# Patient Record
Sex: Male | Born: 1950
Health system: Southern US, Community
[De-identification: ages and names within clinical notes are randomized; demographics above are authoritative.]

## PROBLEM LIST (undated history)

## (undated) DIAGNOSIS — Q8740 Marfan's syndrome, unspecified: Secondary | ICD-10-CM

## (undated) DIAGNOSIS — H543 Unqualified visual loss, both eyes: Secondary | ICD-10-CM

## (undated) DIAGNOSIS — E785 Hyperlipidemia, unspecified: Secondary | ICD-10-CM

## (undated) DIAGNOSIS — K469 Unspecified abdominal hernia without obstruction or gangrene: Secondary | ICD-10-CM

## (undated) DIAGNOSIS — I10 Essential (primary) hypertension: Secondary | ICD-10-CM

## (undated) DIAGNOSIS — I96 Gangrene, not elsewhere classified: Secondary | ICD-10-CM

## (undated) DIAGNOSIS — T7840XA Allergy, unspecified, initial encounter: Secondary | ICD-10-CM

## (undated) HISTORY — PX: PENILE PROSTHESIS IMPLANT: SHX240

## (undated) HISTORY — PX: INGUINAL HERNIA REPAIR: SHX194

## (undated) HISTORY — DX: Allergy, unspecified, initial encounter: T78.40XA

## (undated) HISTORY — PX: APPENDECTOMY: SHX54

## (undated) HISTORY — DX: Gangrene, not elsewhere classified: I96

## (undated) HISTORY — DX: Hyperlipidemia, unspecified: E78.5

## (undated) HISTORY — DX: Unspecified abdominal hernia without obstruction or gangrene: K46.9

## (undated) HISTORY — DX: Marfan syndrome, unspecified: Q87.40

## (undated) HISTORY — DX: Unqualified visual loss, both eyes: H54.3

## (undated) HISTORY — DX: Essential (primary) hypertension: I10

## (undated) HISTORY — PX: ROTATOR CUFF REPAIR: SHX139

---
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
## 1968-02-17 HISTORY — PX: RETINAL DETACHMENT SURGERY: SHX105

## 1998-03-20 ENCOUNTER — Encounter: Admission: RE | Admit: 1998-03-20 | Discharge: 1998-06-18 | Payer: Self-pay | Admitting: Internal Medicine

## 2000-12-10 ENCOUNTER — Emergency Department (HOSPITAL_COMMUNITY): Admission: EM | Admit: 2000-12-10 | Discharge: 2000-12-10 | Payer: Self-pay | Admitting: Emergency Medicine

## 2001-08-18 ENCOUNTER — Ambulatory Visit (HOSPITAL_COMMUNITY): Admission: RE | Admit: 2001-08-18 | Discharge: 2001-08-18 | Payer: Self-pay | Admitting: Gastroenterology

## 2001-10-04 ENCOUNTER — Encounter: Admission: RE | Admit: 2001-10-04 | Discharge: 2001-10-04 | Payer: Self-pay | Admitting: Urology

## 2001-10-04 ENCOUNTER — Encounter: Payer: Self-pay | Admitting: Urology

## 2001-10-21 ENCOUNTER — Ambulatory Visit (HOSPITAL_BASED_OUTPATIENT_CLINIC_OR_DEPARTMENT_OTHER): Admission: RE | Admit: 2001-10-21 | Discharge: 2001-10-21 | Payer: Self-pay | Admitting: Urology

## 2001-10-21 ENCOUNTER — Encounter: Payer: Self-pay | Admitting: Urology

## 2002-03-01 ENCOUNTER — Ambulatory Visit (HOSPITAL_BASED_OUTPATIENT_CLINIC_OR_DEPARTMENT_OTHER): Admission: RE | Admit: 2002-03-01 | Discharge: 2002-03-01 | Payer: Self-pay | Admitting: Orthopedic Surgery

## 2004-06-26 IMAGING — CR DG CHEST 2V
2 series · 2 of 2 positions shown · non-contrast
Comparison: none

CLINICAL DATA: Erectile dysfunction.  Hypertension.  Preoperative respiratory exam.
 CHEST - 2 VIEW: 
 The heart size and mediastinal contours are normal.  The lungs are clear.  The visualized skeleton is unremarkable.

[view not recorded (1 of 2)]
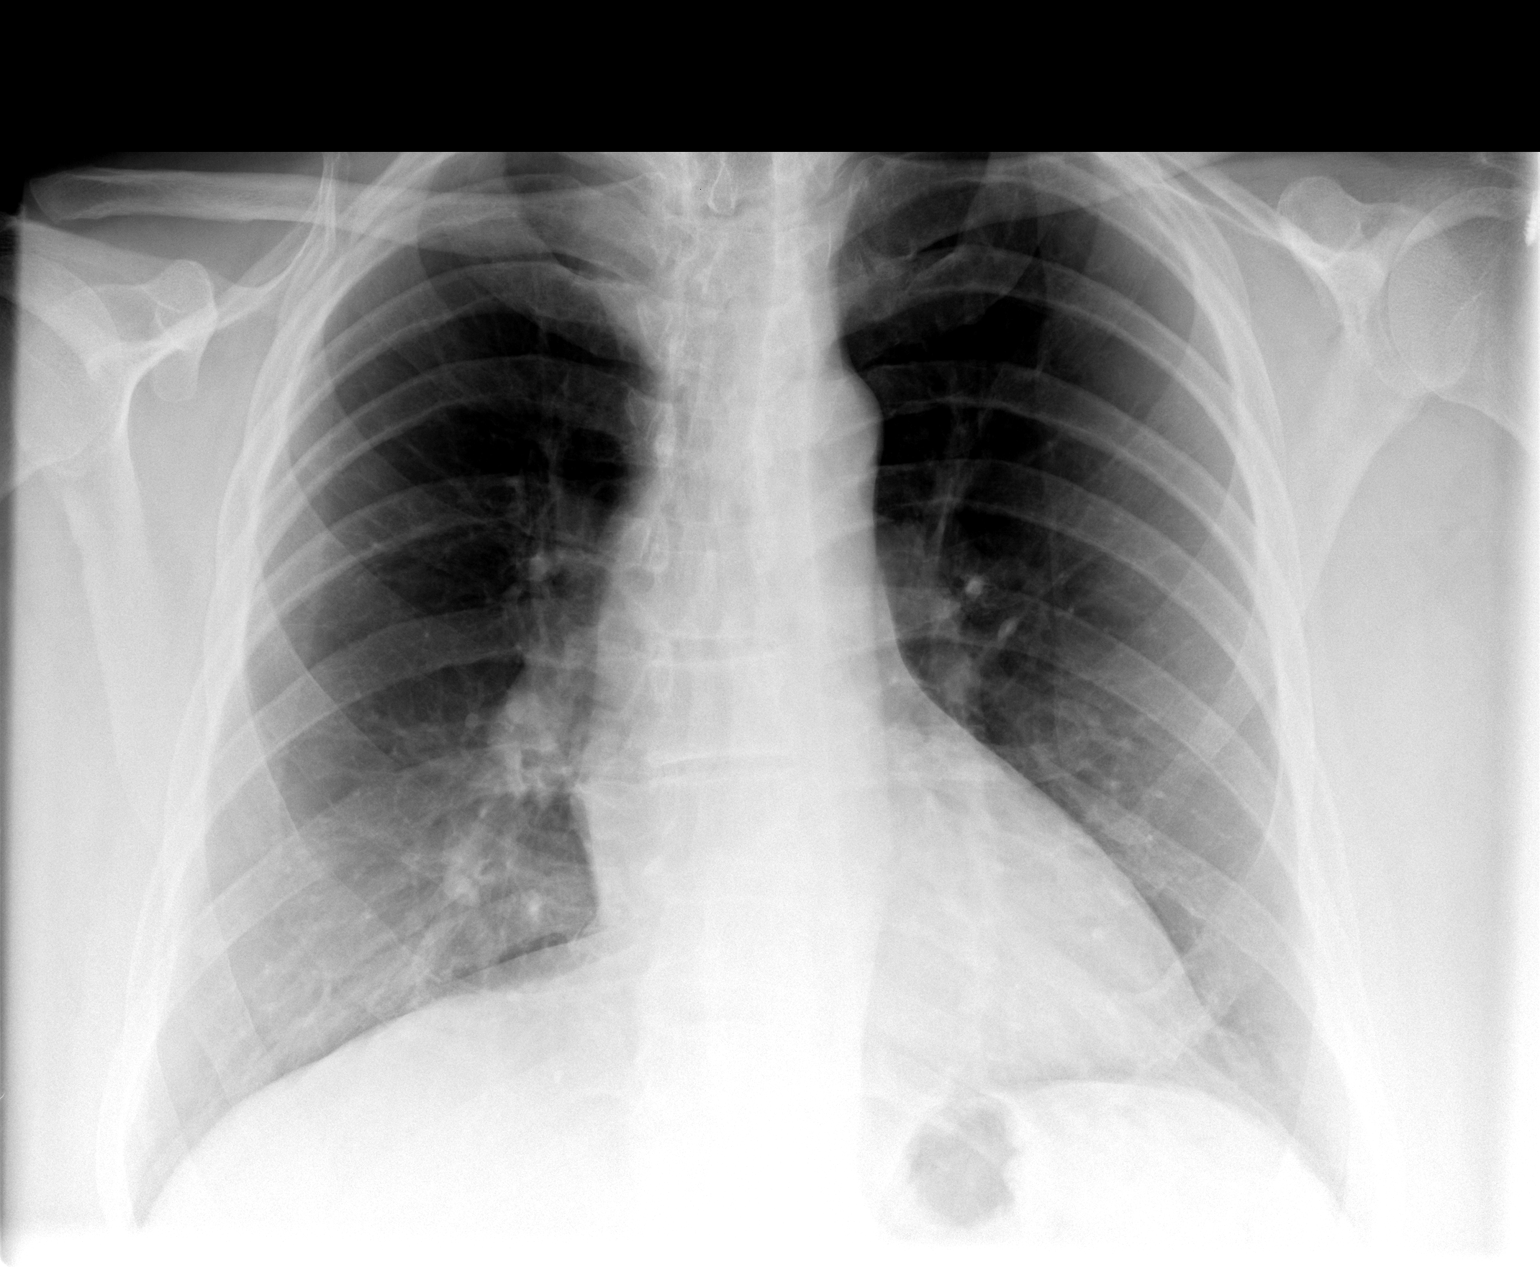

[view not recorded (2 of 2)]
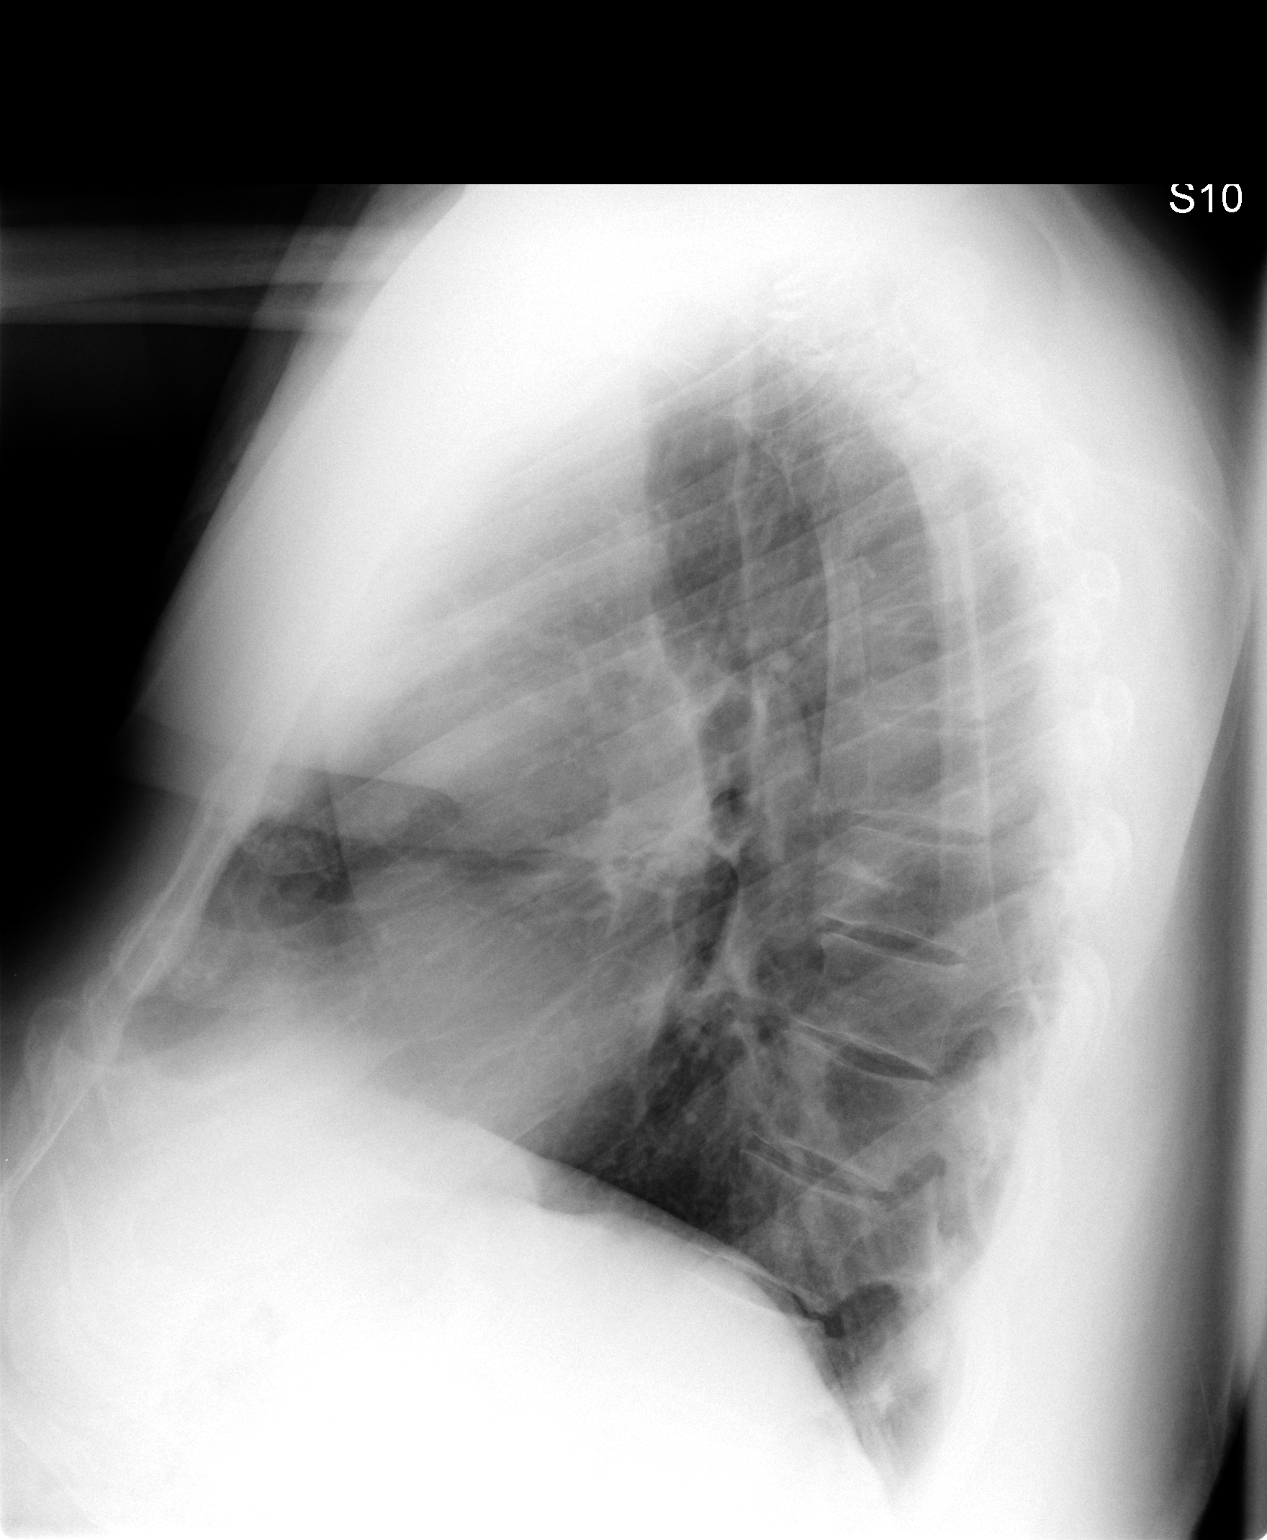

[2 of 2 positions shown; findings below may reference images not displayed]

IMPRESSION: No active disease.

## 2004-06-30 ENCOUNTER — Observation Stay (HOSPITAL_COMMUNITY): Admission: RE | Admit: 2004-06-30 | Discharge: 2004-07-01 | Payer: Self-pay | Admitting: Urology

## 2007-02-17 HISTORY — PX: OTHER SURGICAL HISTORY: SHX169

## 2008-05-21 ENCOUNTER — Ambulatory Visit (HOSPITAL_BASED_OUTPATIENT_CLINIC_OR_DEPARTMENT_OTHER): Admission: RE | Admit: 2008-05-21 | Discharge: 2008-05-21 | Payer: Self-pay | Admitting: Orthopedic Surgery

## 2009-12-10 IMAGING — CR DG CHEST 2V
2 series · 2 of 2 positions shown · non-contrast
Comparison: Chest x-ray of [DATE]

CLINICAL DATA: Preop for surgery to repair left inguinal hernia

CHEST - 2 VIEW

[view not recorded (1 of 2)]
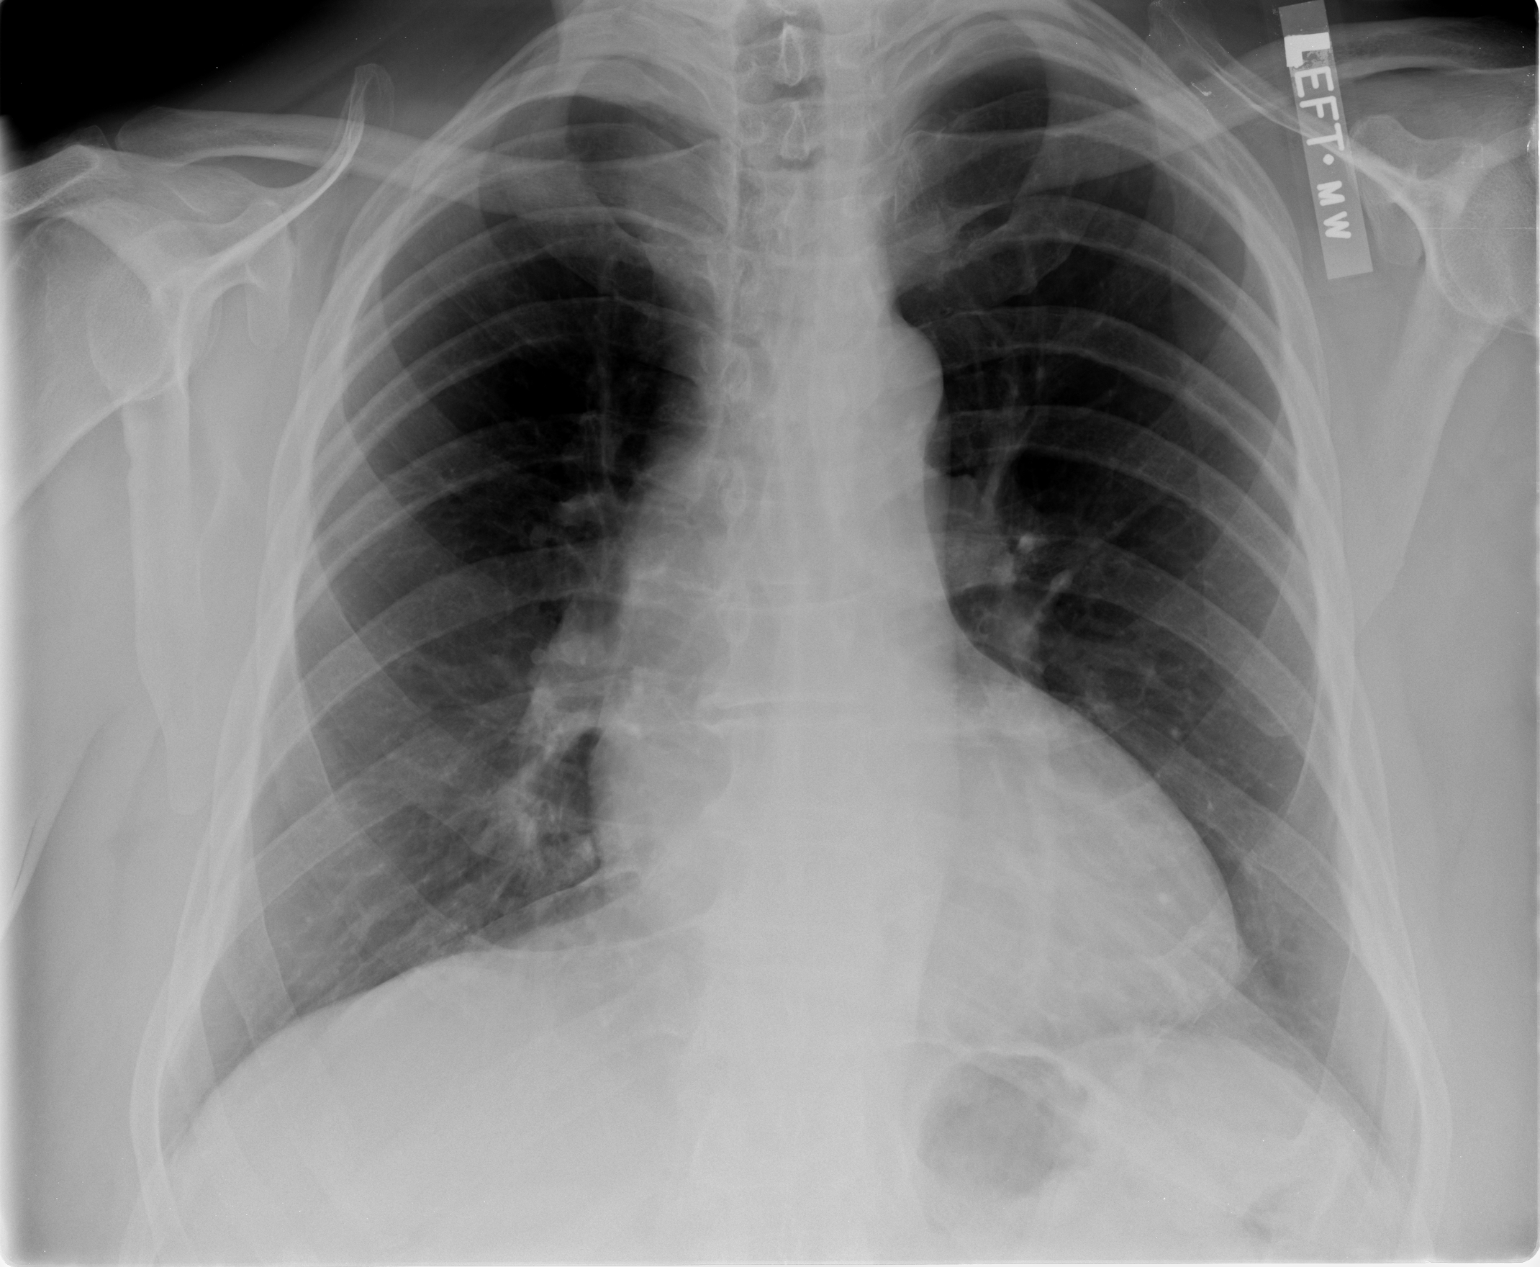

[view not recorded (2 of 2)]
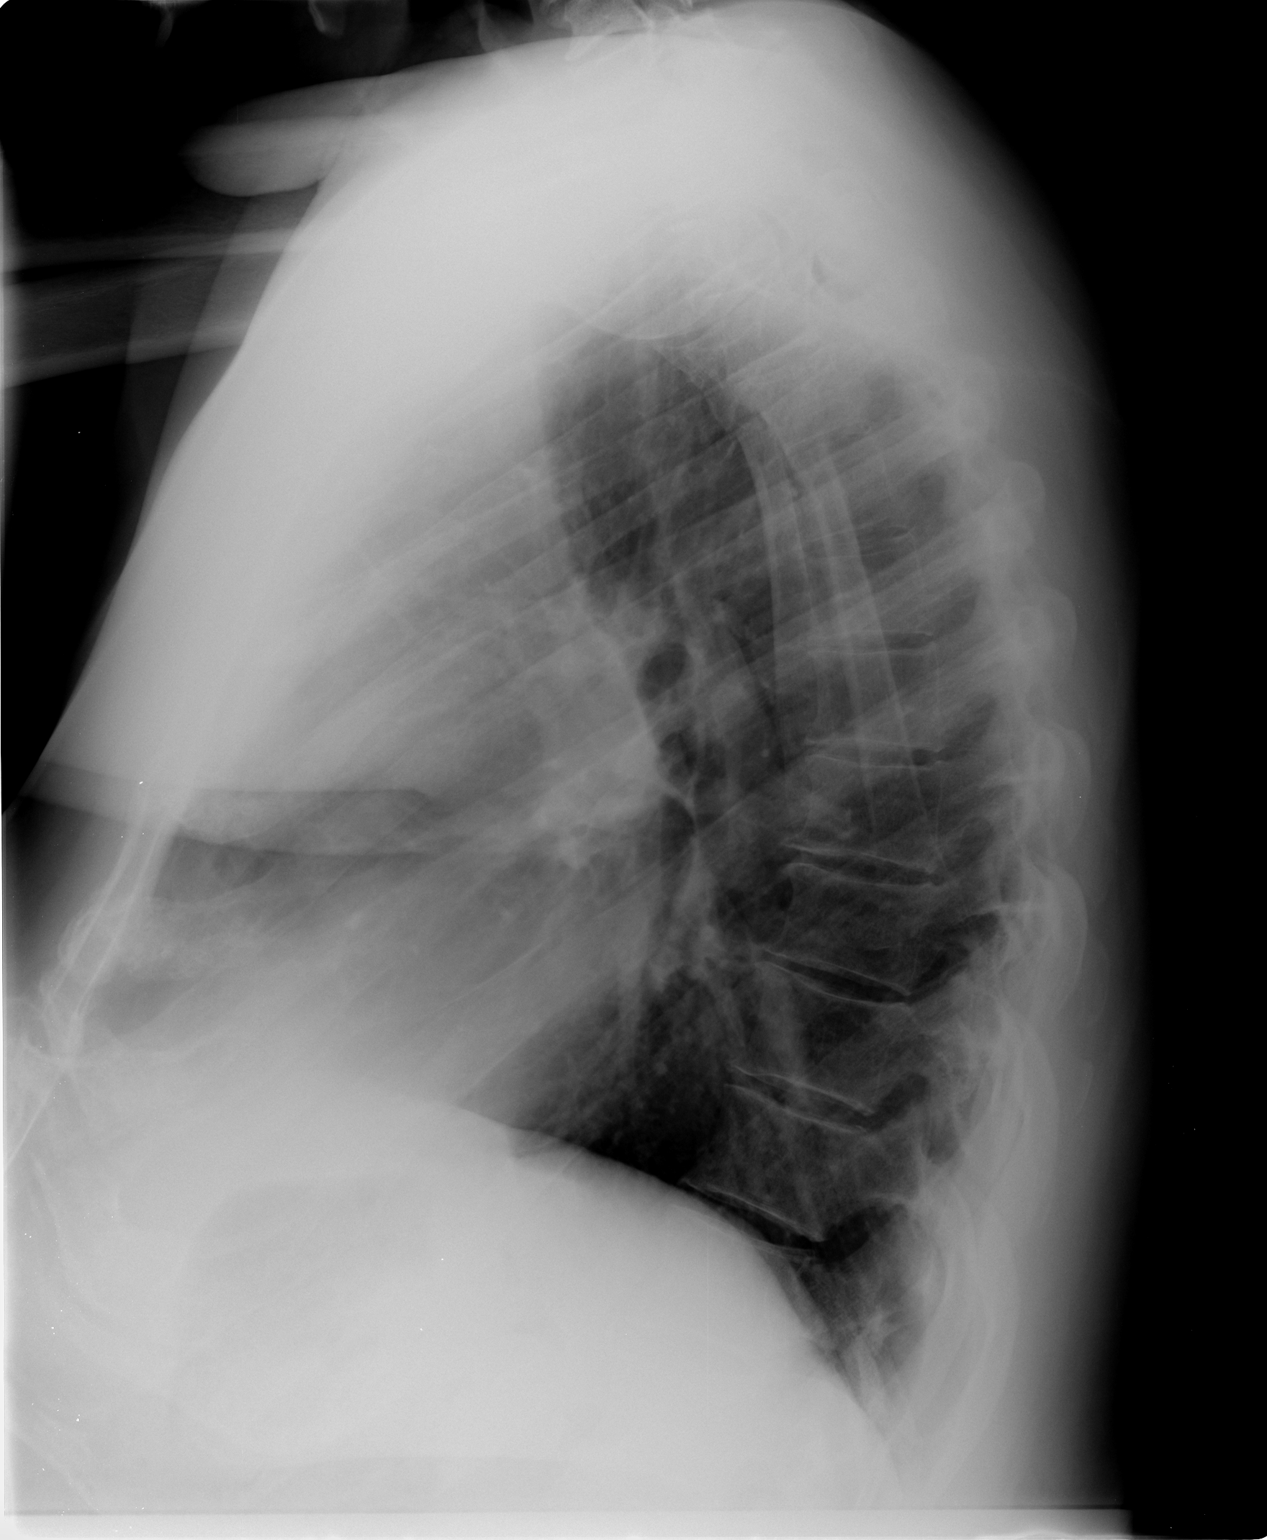

[2 of 2 positions shown; findings below may reference images not displayed]

FINDINGS: The lungs are clear.  The heart is within upper limits of
normal and stable.  No acute bony abnormality is seen.
IMPRESSION: Stable chest x-ray.  No active lung disease.

## 2009-12-13 ENCOUNTER — Ambulatory Visit (HOSPITAL_COMMUNITY): Admission: RE | Admit: 2009-12-13 | Discharge: 2009-12-13 | Payer: Self-pay | Admitting: General Surgery

## 2010-04-30 LAB — DIFFERENTIAL
Basophils Absolute: 0 10*3/uL (ref 0.0–0.1)
Basophils Relative: 0 % (ref 0–1)
Eosinophils Absolute: 0.1 10*3/uL (ref 0.0–0.7)
Lymphs Abs: 2.1 10*3/uL (ref 0.7–4.0)
Neutrophils Relative %: 59 % (ref 43–77)

## 2010-04-30 LAB — BASIC METABOLIC PANEL
BUN: 14 mg/dL (ref 6–23)
CO2: 29 mEq/L (ref 19–32)
Calcium: 9.8 mg/dL (ref 8.4–10.5)
Chloride: 105 mEq/L (ref 96–112)
Creatinine, Ser: 1 mg/dL (ref 0.4–1.5)
GFR calc Af Amer: >60 mL/min (ref >60)
GFR calc non Af Amer: >60 mL/min (ref >60)
Glucose, Bld: 203 mg/dL — ABNORMAL HIGH (ref 70–99)
Potassium: 4.2 mEq/L (ref 3.5–5.1)
Sodium: 142 mEq/L (ref 135–145)

## 2010-04-30 LAB — CBC
HCT: 38.8 % — ABNORMAL LOW (ref 39.0–52.0)
Hemoglobin: 12.3 g/dL — ABNORMAL LOW (ref 13.0–17.0)
MCH: 29.6 pg (ref 26.0–34.0)
MCHC: 31.7 g/dL (ref 30.0–36.0)
MCV: 93.3 fL (ref 78.0–100.0)
Platelets: 251 10*3/uL (ref 150–400)
RBC: 4.16 MIL/uL — ABNORMAL LOW (ref 4.22–5.81)
RDW: 12 % (ref 11.5–15.5)
WBC: 7.2 10*3/uL (ref 4.0–10.5)

## 2010-04-30 LAB — SURGICAL PCR SCREEN
MRSA, PCR: NEGATIVE
Staphylococcus aureus: NEGATIVE

## 2010-05-28 LAB — POCT I-STAT, CHEM 8
BUN: 23 mg/dL (ref 6–23)
Calcium, Ion: 1.23 mmol/L (ref 1.12–1.32)
Chloride: 107 mEq/L (ref 96–112)
Creatinine, Ser: 1 mg/dL (ref 0.4–1.5)
Glucose, Bld: 91 mg/dL (ref 70–99)
TCO2: 31 mmol/L (ref 0–100)

## 2010-06-13 ENCOUNTER — Ambulatory Visit (INDEPENDENT_AMBULATORY_CARE_PROVIDER_SITE_OTHER): Payer: Medicare Other | Admitting: Family Medicine

## 2010-06-13 ENCOUNTER — Encounter: Payer: Self-pay | Admitting: Family Medicine

## 2010-06-13 DIAGNOSIS — D649 Anemia, unspecified: Secondary | ICD-10-CM

## 2010-06-13 DIAGNOSIS — I10 Essential (primary) hypertension: Secondary | ICD-10-CM | POA: Insufficient documentation

## 2010-06-13 DIAGNOSIS — E119 Type 2 diabetes mellitus without complications: Secondary | ICD-10-CM

## 2010-06-13 DIAGNOSIS — R1032 Left lower quadrant pain: Secondary | ICD-10-CM

## 2010-06-13 DIAGNOSIS — E1149 Type 2 diabetes mellitus with other diabetic neurological complication: Secondary | ICD-10-CM | POA: Insufficient documentation

## 2010-06-13 DIAGNOSIS — E785 Hyperlipidemia, unspecified: Secondary | ICD-10-CM

## 2010-06-13 NOTE — Patient Instructions (Signed)
Make appointment with lab in May for fasting labs.  This means no food or drink after midnight the night before.  Water and black coffee is ok.  So you can take your medicine in the morning.   Make the appointment at 8:30 so you can eat and take your medicine after wards.  Make an appointment 1-2  week after that appointment so we can go over your labs.

## 2010-06-13 NOTE — Assessment & Plan Note (Signed)
New Pt.  Taking Simvastatin 40mg  qhs.  Will check FLP.  Will add ASA 81mg  to daily meds.

## 2010-06-13 NOTE — Progress Notes (Signed)
  Subjective:    Patient ID: Chad Hall, male    DOB: 07-19-1950, 60 y.o.   MRN: [id number]  HPI New Patient Exam  DIABETES Meds: metformin 1000mg  XL daily, Glyburide 5mg  daily Compliance: yes Lightheadedness: no    Dizziness: no    Confusion : no   Shakiness: no Abd  pain: no   Nausea: no    Vomiting: no   HYPERLIPIDEMIA: WUJ:[id number] 40mg  qhs Compliance: yes  Prob taking med:no Muscle aches:no Abd  pain:no  HYPERTENSION Disease Monitoring Blood pressure range: 110s-120s Medications: Enalapril 20, Bisoprolol 5-HCTZ 6.25 Compliance:  Yes   Lightheadedness: no  Edema:no  Chest pain: no  Dyspneano   Syncope: no  Palpitations: no   Prevention Exercise: tries to exercise Salt restriction: yes  Left inguinal pain: Present 3 months.  Come when pt is standing a lot.  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  It feels tender and he feels the pain is similar to hernia pain from before.  He sometimes feels like he needs to have BM, but when he goes, he does not have a BM.  Pain is burning pain.  Pain sometimes radiates to L left.  Urine and BM normal.  No saddle anesthesia.  Pain sometimes makes him nauseous.   Last BM: 3 days ago.  He is taking Vicodin for shoulder pain so it causes BM.   He has an appointment to see Dr Chad Hall of [facility] on Fri 06/20/10.  Preventatives: 1)Colonoscopy 5 yrs ago by Chad Hall, it is almost time for another one. 1st one had no polyp. 2)Pneumovax: not sure  3)Tetanus? Not sure   Review of Systems Per hpi     Objective:   Physical Exam  Constitutional: He is oriented to person, place, and time. He appears well-developed and well-nourished. No distress.  HENT:  Head: Normocephalic and atraumatic.  Neck: Normal range of motion. Neck supple. No thyromegaly present.  Cardiovascular: Normal rate, regular rhythm, normal heart sounds and intact distal pulses.   No murmur heard. Pulmonary/Chest: Effort normal and breath  sounds normal. No respiratory distress. He has no wheezes.  Abdominal: Soft. Bowel sounds are normal. He exhibits no distension. There is no tenderness.       No tenderness or mass to palpation of L inguinal area.  NO inguinal nodes.  Testicles descended equally bilaterally.   Lymphadenopathy:    He has no cervical adenopathy.  Neurological: He is alert and oriented to person, place, and time.  Skin: Skin is warm and dry.          Assessment & Plan:

## 2010-06-13 NOTE — Assessment & Plan Note (Signed)
New pt.  BP 121/67 and at goal of <130/80.  Will not make changes to Enalapril 20mg  , Bisoprolol 5-HCTZ 6.25.  Will check renal function and cbc.

## 2010-06-13 NOTE — Assessment & Plan Note (Signed)
Pt with L inguinal pain x 3 months when he has to stand for long periods at work.  He does not appear toxic on exam.  Exam benign and without mass or tenderness.  He is s/p inguinal repair 2011. He has appt with surgeon Dr Andrey Campanile on 06/20/10 so will defer to surgeon.  Pt agreeable to plan.

## 2010-06-13 NOTE — Assessment & Plan Note (Signed)
New patient exam.  Will check A1C today as pt states the last one was 6 months ago.  He is taking Metformin xl 1000mg  daily and glyburide 5mg  daily. Will cont that for now.  Will check renal function when pt returns for fasting labs.

## 2010-07-01 NOTE — Op Note (Signed)
NAME:  Chad Hall, Chad Hall             ACCOUNT NO.:  [id number]   MEDICAL RECORD NO.:  [id number]          PATIENT TYPE:  AMB   LOCATION:  [facility]                          FACILITY:  [facility]   PHYSICIAN:  Feliberto Gottron. Turner Daniels, M.D.   DATE OF BIRTH:  Aug 29, 1950   DATE OF PROCEDURE:  05/21/2008  DATE OF DISCHARGE:                               OPERATIVE REPORT   PREOPERATIVE DIAGNOSIS:  Right shoulder massive rotator cuff tear and  impingement syndrome.   POSTOPERATIVE DIAGNOSIS:  Right shoulder massive rotator cuff tear and  impingement syndrome with the addition of intra-articular loose bodies.   PROCEDURE:  Right shoulder arthroscopic anterior-inferior acromioplasty,  coplaning of the distal clavicle, removal of chondral loose bodies.  There may have actually been pieces of tendon floating around the joint  followed by mini open rotator cuff repair using two of the 5.5 Biomet  threaded screws for a medial row repair and then three of the ALLthread  knotless screws for lateral repair.   SURGEON:  Feliberto Gottron. Turner Daniels, MD   FIRST ASSISTANT:  Shirl Harris, PA-C   ANESTHETIC:  General endotracheal and right interscalene block.   ESTIMATED BLOOD LOSS:  50 mL.   FLUID REPLACEMENT:  100 mL crystalloid.   DRAINS PLACED:  None.   TOURNIQUET TIME:  None.   INDICATIONS FOR PROCEDURE:  A 60 year old gentleman who is blind and has  a fairly massive right shoulder rotator cuff tear with a high riding  humeral head and pain has been refractory to physical therapy, anti-  inflammatory medicines, and cortisone injections.  He desires elective  arthroscopic evaluation treatment of his right shoulder.  We anticipate  doing a simple debridement, but if the cuff is pliable enough, we may  elect to do rotator cuff repair.  The risks and benefits of surgery have  been discussed with the patient, his questions answered.   DESCRIPTION OF PROCEDURE:  The patient was identified by armband and  repair underwent  right shoulder interscalene block anesthetic at the day  surgery center, [facility] and received IV antibiotics  preoperatively.  He was taken to the operating room #2 where the  appropriate anesthetic monitors were attached and general endotracheal  anesthesia induced with the patient in supine position.  He was then  placed in the beach chair position and the right upper extremity  prepped, draped in usual sterile fashion from the wrist to the  hemithorax.  Time-out procedure was performed and then using a #11 blade  standard portals were made 1.5 cm anterior to the [facility] joint, lateral to  the junction, middle and posterior thirds of the acromion, and posterior  to the posterolateral corner acromion process.  The inflow was placed  anteriorly, the arthroscope laterally and a 4.2 great white sucker  shaver posteriorly.  We immediately encountered a massive rotator cuff  tear that was about two-thirds of the weight of the glenoid rim.  There  are also some intra-articular loose bodies that may have been pieces of  tendon and these were removed with 4.2 great white sucker shaver.  We  then performed a debridement  of the subacromial arch and using a 4.5  hooded vortex bur, created a flat type 1 acromion.  A supplemental  anterolateral portal was made allowing introduction of a grasper.  We  grasped the rotator cuff, which involved the supraspinatus and one-half  of the infraspinatus tendon, and applied traction to it and actually it  was pliable enough where it came to the footprint of the old insertion  and because of this it was felt to be amenable to repair.  Because it  involved such a large portion of the tendons, we elected to go ahead and  do this through a mini open approach.  We also took a look inside the  glenohumeral joint through the cuff defect and removed a couple more  loose bodies and lightly debrided the labrum.  The articular cartilage  was in remarkably good shape.  At  this point, a 4-cm incision was made  from the anterolateral tip of the acromion, going distally along the  anterolateral side of the deltoid.  This was split manually allowing [place]  to enter the subacromial space.  We immediately encountered the rotator  cuff tear and using a simple Weitlaner retractor applied traction to the  deltoid muscle fibers, allowing visualization of the insertion print of  the anterior half of the infraspinatus and the supraspinatus tendons.  This was then rongeur back to bleeding bone and the first of two Biomet  5.5 anchors each double armed with MaxBraid suture #2 was inserted in  the posterior aspect of the footprint.  Then using the bypass suture  passer, we passed all four strands through the posterior inferior one  half of the cuff tear and each pair was then tied using a simple couple  of half hitches reducing the cuff to the medial edge of the insertion  footprint.  A second 5.5 anchor was placed in the anterior aspect of the  footprint and likewise those 4  sutures passed through the rotator cuff  using the bypass suture punch and each pair was then tied with a couple  of half hitches once again reducing the rotator cuff to the medial edge  of the footprint.  We then placed three knotless ALLthread anchors  laterally with one limb each from the first two suture anchors.  First  three suture pairs in the first knotless ALLthread.  A pair from the  first, second, and third in the second knotless ALLthread anchor and  then a suture from the fourth and third in the last suture anchor.  Each  thread was tensioned before the knotless ALLthread anchor was driven and  a good mattress type repair of the rotator cuff was accomplished with  the cuff nicely reduced to the footprint of the rotator cuff.  This was  at the arm at the side.  The wound was then irrigated out with normal  saline solution.  The fascia investing the outer aspect of the deltoid  muscle was  then closed with running 2-0 Vicryl suture, the subcutaneous  tissue with 2-0 Vicryl suture, and the skin with running interlocking 3-  0 nylon suture.  Dressing of Xeroform 4x4 dressing sponges, paper tape,  and a sling was then applied.  The patient was then awakened and taken  to the recovery room without difficulty.      Feliberto Gottron. Turner Daniels, M.D.  Electronically Signed     FJR/MEDQ  D:  05/21/2008  T:  05/22/2008  Job:  [id number]

## 2010-07-03 ENCOUNTER — Other Ambulatory Visit: Payer: Self-pay | Admitting: General Surgery

## 2010-07-03 DIAGNOSIS — R1030 Lower abdominal pain, unspecified: Secondary | ICD-10-CM

## 2010-07-04 NOTE — H&P (Signed)
NAME:  Chad Hall, Chad Hall             ACCOUNT NO.:  [id number]   MEDICAL RECORD NO.:  [id number]          PATIENT TYPE:  AMB   LOCATION:  DAY                          FACILITY:  [facility]   PHYSICIAN:  Lindaann Slough, M.D.  DATE OF BIRTH:  03-13-1950   DATE OF ADMISSION:  06/30/2004  DATE OF DISCHARGE:                                HISTORY & PHYSICAL   CHIEF COMPLAINT:  Inability to have erections.   HISTORY OF PRESENT ILLNESS:  The patient is a 60 year old male who has been  complaining of difficulty achieving erections for several years.  He has  used Cialis, Levitra and Viagra without success.  He has tried PGE1, but he  had had severe pain with it and he does not want to use it any more, and he  does not want to use the vacuum device.  He has not had intercourse for over  a year, and he would like to have a penile prosthesis.  He is admitted today  for the procedure.   PAST MEDICAL HISTORY:  Positive for hypertension, diabetes.   MEDICATIONS:  1.  Glyburide 5 mg twice a day.  2.  Sulindac 200 mg p.r.n.  3.  Enalapril 20 mg twice a day.  4.  Metformin 1000 mg twice a day.  5.  Bumex 20 mg half a tablet a day.   ALLERGIES:  He is allergic to PENICILLIN.   PAST SURGICAL HISTORY:  He had surgery on both eyes in the 1960s and the  1970s.   SOCIAL HISTORY:  He is married.  Does not have any children.  Does not smoke  nor drink.   FAMILY HISTORY:  Positive for hypertension.  His father died at age 34.  His  mother died of a heart attack at the age of 32.  His maternal grandmother  had diabetes, and he has two sisters.   REVIEW OF SYSTEMS:  He has no cough, no shortness of breath, no hemoptysis.  CARDIOVASCULAR:  No palpitations, no chest pain.  GASTROINTESTINAL:  No  nausea, no vomiting, no diarrhea or constipation.  GENITOURINARY:  He has  frequency and no dysuria or hematuria.  All others are negative.   PHYSICAL EXAMINATION:  GENERAL:  This is a well-built, blind  60 year old  male in no acute distress.  VITAL SIGNS:  Blood pressure is 122/78, pulse 86, respirations 18,  temperature 98.1.  HEENT:  His head is normal.  The patient is blind.  He has pink  conjunctivae.  Ears, nose and throat are within normal limits.  NECK:  Supple, no cervical adenopathy, no thyromegaly.  CHEST:  Lungs are clear to percussion and auscultation.  CARDIAC:  Regular rhythm, no murmurs or gallops.  ABDOMEN:  Soft, moderately obese, nontender.  He has no CVA tenderness.  Kidneys are not palpable.  He has no hepatomegaly, no splenomegaly.  Bladder  is not distended.  He has no umbilical hernia.  GENITOURINARY:  Penis is uncircumcised.  The meatus is normal.  Scrotum is  normal.  He has no hydrocele.  The right testis is atrophic.  The left  testis  is normal.  There is no testicular mass.  Cords and epididymis are  within normal limits.  RECTAL:  He has no external hemorrhoids.  Sphincter tone is normal.  Prostate is enlarged at 30 g.  No nodules.  Seminal vesicles not palpable.  EXTREMITIES:  Within normal limits.  He has no pedal edema, no deformity,  and he has good peripheral pulses.   IMPRESSION:  1.  Erectile impotence.  2.  History of urethral stricture.  3.  Diabetes.  4.  Hypertension.      MN/MEDQ  D:  06/30/2004  T:  06/30/2004  Job:  [id number]

## 2010-07-04 NOTE — Op Note (Signed)
NAME:  Chad Hall, Chad Hall                       ACCOUNT NO.:  [id number]   MEDICAL RECORD NO.:  [id number]                   PATIENT TYPE:  AMB   LOCATION:  [facility]                                  FACILITY:  [facility]   PHYSICIAN:  Feliberto Gottron. Turner Daniels, M.D.                DATE OF BIRTH:  Aug 29, 1950   DATE OF PROCEDURE:  03/01/2002  DATE OF DISCHARGE:                                 OPERATIVE REPORT   PREOPERATIVE DIAGNOSES:  1. Left shoulder impingement syndrome.  2. Rotator cuff tear.   POSTOPERATIVE DIAGNOSES:  1. Left shoulder impingement syndrome.  2. Rotator cuff tear.   PROCEDURES:  1. Left shoulder arthroscopic anterior inferior acromioplasty.  2. Distal clavicle co-planing.  3. Mini-open rotator cuff repair of the supraspinatus tendon using three of     the 4 mm Revo screws with #2 Ethibond.   SURGEON:  Feliberto Gottron. Turner Daniels, M.D.   ASSISTANTLaural Benes. Su Hilt, P.A.-C   ANESTHESIA:  Interscalene block and general endotracheal.   ESTIMATED BLOOD LOSS:  Minimal.   FLUIDS REPLACED:  800 cubic centimeters crystalloid.   DRAINS:  None.   TOURNIQUET TIME:  None.   INDICATION FOR PROCEDURE:  This is a 60 year old man with left shoulder  impingement syndrome and MRI proven rotator cuff tear, who presented to my  office last week with  pain and weakness in his left shoulder, having failed  conservative measures including anti-inflammatory medicine, observation, and  rest.  Because the MRI did show a minimally displaced supraspinatus tear of  the rotator cuff as well as a type 3 subacromial spur, he is taken for  arthroscopic decompression of the left shoulder, followed by mini-open  rotator cuff repair using a Revo screw technique.   DESCRIPTION OF PROCEDURE:  The patient was identified by arm band, taken to  the operating room at [facility].  Appropriate anesthetic  monitors were attached and interscalene block anesthesia induced, followed  by general endotracheal  anesthesia.  He was then placed in the beach-chair  position and the left upper extremity prepped and draped in the usual  sterile fashion from the wrist to the hemithorax.   We began the procedure by making standard portals 1.5 cm anterior to the [facility]  joint, lateral to the junction of the middle and posterior thirds of the  acromion, and posterior to the posterolateral corner of the acromion  process.  The inflow was placed anteriorly, the arthroscope laterally, and a  4.2 Great White sucker shaver posteriorly.  We immediately identified a  supraspinatus tear of the rotator cuff.  It was retracted maybe 5-6 mm off  of its insertion on the greater tuberosity with relatively healthy appearing  tissue.   We then performed a subacromial bursectomy, identified the subacromial spur  and the distal clavicle spur, and removed them with a 4.5 hooded Vortex bur,  creating a type 1 acromion and  allowing better visualization for the mini-  open rotator cuff repair.  Satisfied with the decompression, we then  inserted the arthroscope into the glenohumeral joint using the posterior  portal and documented good condition of the articular cartilage.  The labrum  had minimum fraying that required minimal debridement.  The biceps anchor  was intact and we visualized the rotator cuff tear from the inside, and it  was pretty much isolated to the supraspinatus insertion.   At this point, the shoulder was washed out with normal saline solution.  Under arthroscopic guidance, a needle was placed at the anterior lateral  corner of the acromion and the arthroscopic instruments removed.  We then  made a 4 cm incision from the tip of the acromion anterolaterally through  the skin and subcutaneous tissue and down to the fibers of the deltoid  muscle, which were then split in line with the skin incision along the  anterolateral raphe, down to the subacromial bursa, which was incised.  Then  using a Weitlander  retractor, we visualized the subacromial space and the  rotator cuff tear.  Using rongeurs, we carefully cleaned the bed and the  greater tuberosity and placed three of the 4 mm Revo screws deep into the  bone and then performed the repair of the supraspinatus tendon with the #2  Ethibond sutures attached to the Revo screws.  Firm fixation was obtained  and a good cuff of tissue was repaired back to its bony bed.   After putting at least six throws in each suture, the ends were cut, the  wound washed out with normal saline solution, the anterolateral raphe closed  with running 2-0 Vicryl suture, the subcutaneous tissue likewise with  running 2-0 Vicryl suture, and the skin with running interlocking 3-0 nylon  suture.  A dressing of Xeroform, 4 x 4 dressing sponges, and paper tape  applied followed by a shoulder immobilizer.   The patient was awakened and taken to the recovery room without difficulty.                                               Feliberto Gottron. Turner Daniels, M.D.    Ovid Curd  D:  03/01/2002  T:  03/01/2002  Job:  [id number]

## 2010-07-04 NOTE — Op Note (Signed)
   Chad Hall, CHIZMAR                      ACCOUNT NO.:  [id number]   MEDICAL RECORD NO.:  [id number]                   PATIENT TYPE:  AMB   LOCATION:  [facility]                                 [facility]ACILITY[facility]   PHYSICIAN:  Lindaann Slough, M.D.               DATE OF BIRTH:  03/16/1950   DATE OF PROCEDURE:  10/21/2001  DATE OF DISCHARGE:                                 OPERATIVE REPORT   PREOPERATIVE DIAGNOSIS:  Urethral stricture.   POSTOPERATIVE DIAGNOSIS:  Urethral stricture.   PROCEDURE DONE:  1. Cystoscopy.  2. Visual urethrotomy.   SURGEON:  Lindaann Slough, M.D.   ANESTHESIA:  General.   INDICATION:  The patient is a 60 year old male, who was found on urinalysis  to have microhematuria.  CT scan of the abdomen and pelvis showed no  hydronephrosis or renal tumors.  The patient had cystoscopy in the office,  and he was found to have a urethral stricture, and the cystoscope could not  be passed in the bladder.  He is scheduled today for cystoscopy and visual  urethrotomy.   DESCRIPTION OF PROCEDURE:  Under general anesthesia, the patient was prepped  and draped and placed into the dorsal lithotomy position.  A #20 French  cystoscope was passed in the urethra.  The cystoscope could not be passed in  the bladder because of a stricture in the bulbous urethra.  The cystoscope  was then removed.  A visual urethrotome was then passed in the urethra, and  the stricture was incised.  The stricture is about 2 cm long and just distal  to the external sphincter.  The urethrotome was then advanced in the  bladder.  The urethrotome was then removed.  The cystoscope was then re-  inserted in the bladder without difficulty.  The patient has moderate  prostatic hypertrophy.  The bladder is moderately trabeculated.  There is no  stone or tumor in the bladder.  The ureteral orifices are in normal position  and shape with clear efflux.  The cystoscope was then removed.  A #20 French  Foley catheter was then inserted in the bladder.   The patient tolerated the procedure well and left the OR in satisfactory  condition to postanesthesia care unit.                                               Lindaann Slough, M.D.    MN/MEDQ  D:  10/21/2001  T:  10/21/2001  Job:  [id number]   cc:   Marinus Maw, M.D.

## 2010-07-04 NOTE — Op Note (Signed)
NAME:  Chad Hall, Chad Hall             ACCOUNT NO.:  [id number]   MEDICAL RECORD NO.:  [id number]          PATIENT TYPE:  AMB   LOCATION:  DAY                          FACILITY:  [facility]   PHYSICIAN:  Lindaann Slough, M.D.  DATE OF BIRTH:  1950/07/12   DATE OF PROCEDURE:  06/30/2004  DATE OF DISCHARGE:                                 OPERATIVE REPORT   PREOPERATIVE DIAGNOSIS:  Erectile dysfunction.   POSTOPERATIVE DIAGNOSIS:  Erectile dysfunction.   PROCEDURES:  Placement of a three-piece AMS penial prosthesis (infrapubic  approach).   ATTENDING SURGEON:  Lindaann Slough, M.D.   RESIDENT SURGEON:  Rhae Lerner, M.D.   ANESTHESIA:  General endotracheal anesthesia.   COMPLICATIONS:  None.   INDICATIONS FOR PROCEDURE:  Chad Hall is a 61 year old gentleman with a  past medical history significant for hypertension and diabetes mellitus, who  is legally blind.  Urologically Chad Hall has a long history of erectile  dysfunction and has failed attempts at oral medications.  A long discussion  was held with Chad Hall concerning alternative therapies for his erectile  dysfunction, including vacuum erection device.  However, he prefers to  proceed with placement of penial prosthesis.  After discussing the risks  associated with such procedure, Chad Hall has voiced his understanding  and agrees to proceed.   PROCEDURE IN DETAIL:  The patient was brought to the operating room  following induction of general endotracheal anesthesia.  He was placed in  the supine position and prepped and draped in the usual sterile fashion.  A  16 French Foley catheter was initially placed to straight drain.  It was  noted at the time of Foley catheter placement that the patient had a  subglanular hypospadias and a rather tight urethra.  Once the Foley was in  place, a 5 cm infrapubic incision was performed and dissection carried down  subcutaneous fat and superficial fascia to the level of  the corporal bodies.  Two interrupted 2-0 Vicryl sutures were placed in the left corporal body and  parallel to each other.  A corporotomy incision was subsequently created and  the left corporal body dilated up to a size 14.  Next attention was turned  to the right side.  Again two interrupted 2-0 Vicryl sutures were placed  parallel to one another and a corporotomy incision performed between those  two sutures.  The right corporal body was subsequently dilated up to a size  14.  Multiple interrupted 2-0 Vicryl sutures were then placed on either side  of the corporotomy incision for later closure of the incision after the  prosthesis was in placed.  The length of each corporal body was then  measured and appeared to be 22-23 cm in length.  An 18 cm cylinder was  subsequently chosen with a 3 cm rear-tip extender.  Each cylinder was  subsequently placed into each corporal body using a furrow tool and the  proximal portion of the cylinders seated into the proximal corporal body.  The cylinders were subsequently inflated and the length in relation to the  penis checked.  At this point,  the cylinders were noted to be slightly  proximal to the glans penis.  Several trials of different lengths were  subsequently investigated until finally an additional 2 cm read-tip extender  was added to each 3 cm rear-tip extender, bringing the total length to 23  cm.  On inflation of this, it did indeed appear to be the appropriate length  for the prosthesis.  Prior to closure of the corporotomy incisions,  dissection was carried superiorly over the superior aspect of the pubic bone  and the space of Retzius entered.  A small space was developed posterior to  the rectus muscle and a 65 mL reservoir placed within the pocket.  The  fascia was subsequently closed using two figure-of-eight 2-0 Vicryl sutures.  The corporotomy incisions were then closed using the previously placed 2-0  Vicryl sutures on either side  of the incision.  A pocket was then created in  the inferior right scrotum and the pump placed within this pocket.  All  tubing was subsequently connected using the [employer] supplies.  The  prosthesis was then inflated a final time to assure that the cylinders were  positioned properly and that the erection was adequate.  Once this had been  confirmed, the prosthesis was deflated and closure initiated.  The  superficial fascia was reapproximated using a running 2-0 Vicryl suture.  The skin was then reapproximated using a running 5-0 Monocryl.  Dermabond  was then applied to the suture line.  The wound and penis were then dressed.  The patient was allowed to awaken and the case was ended.  The patient  tolerated the procedure well and there were no complications.  Please note  that Lindaann Slough, M.D., was present for the entire case and participated  in all aspects of the procedure.      JJP/MEDQ  D:  06/30/2004  T:  06/30/2004  Job:  [id number]

## 2010-07-04 NOTE — Discharge Summary (Signed)
NAME:  Chad Hall, Chad Hall             ACCOUNT NO.:  [id number]   MEDICAL RECORD NO.:  [id number]          PATIENT TYPE:  OBV   LOCATION:  0357                         [facility]ACILITY:  [facility]   PHYSICIAN:  Lindaann Slough, M.D.  DATE OF BIRTH:  11-12-1950   DATE OF ADMISSION:  06/30/2004  DATE OF DISCHARGE:  07/01/2004                                 DISCHARGE SUMMARY   DISCHARGE DIAGNOSES:  1.  Erectile dysfunction.  2.  Diabetes.  3.  Hypertension.  4.  Urethral stricture.   PROCEDURE DONE:  Insertion of inflatable penile prosthesis on Jun 30, 2004.   The patient is a 60 year old male with a long history of erectile impotence  who had used Viagra, Levitra and Cialis without success.  He had severe pain  with PGE1 and did not want to use a VED, and he wanted to have a penile  prosthesis, and he was admitted on Jun 30, 2004, for the procedure.   PHYSICAL EXAMINATION:  VITAL SIGNS:  Blood pressure 122/78, pulse 86,  respirations 18, temperature 98.  LUNGS:  Clear to percussion and auscultation.  HEART:  Regular rhythm.  ABDOMEN:  Soft, nondistended and nontender.  No CVA tenderness.  No  organomegaly.  Bladder not distended.  Bowel sounds normal.  GENITALIA:  Penis is uncircumcised.  The meatus is normal.  Scrotum is  normal.  The right testis is atrophic.  Cords and epididymis are within  normal limits.  RECTAL:  Sphincter tone is normal.  Prostate is enlarged, 30 gm, no nodules.  Seminal vesicles not palpable.   Hemoglobin on admission is 10.7, hematocrit 32.5. WBC 7.3.  BUN 41,  creatinine 1.6, glucose 97, sodium 140, potassium 4.8.  PT and PTT within  normal limits.  Urinalysis showed 0-2 wbc's and rare bacteria.  Urine  culture is negative.   A chest x-ray showed no evidence of active disease.  EKG is within normal  limits.   The patient had an inflatable penile prosthesis inserted on Jun 30, 2004.  The  Foley catheter  was removed on the evening of the surgery, and he  voided  only a small amount of urine after the removal of the Foley.  His  bladder was not distended.  He had minimal penile and scrotal swelling.  On  May 16, he was afebrile.  He was then discharged home on Cipro 500 mg twice  a day, Percocet 1-2 tablets q.4h. p.r.n. for pain, Zocor 40 mg a day, Bumex  10 mg a day, Vasotec 20 mg a day, glyburide 5 mg a day and Glucophage 1000  mg twice a day.   CONDITION ON DISCHARGE:  Improved.   DISCHARGE DIET:  2000-calorie diabetic diet.   The patient is instructed not to do any lifting, straining or driving until  further advised.  He will be seen in the office in three days to check his  penile prosthesis.      MN/MEDQ  D:  07/01/2004  T:  07/01/2004  Job:  [id number]   cc:   Lucky Cowboy, M.D.  [address], [address]  [place], [place]  [address]  Fax: [phone]

## 2010-07-07 ENCOUNTER — Ambulatory Visit
Admission: RE | Admit: 2010-07-07 | Discharge: 2010-07-07 | Disposition: A | Discharge status: Home / Self Care | Payer: Medicare Other | Source: Ambulatory Visit | Attending: General Surgery | Admitting: General Surgery

## 2010-07-07 DIAGNOSIS — R1030 Lower abdominal pain, unspecified: Secondary | ICD-10-CM

## 2010-07-07 IMAGING — CT CT PELVIS W/ CM
2 of 3 series · 17 of 46 positions shown, 19 images · IV contrast (READICAT & [ID] OMNI 300)
Comparison: 125 ml of [89] intravenous contrast

***ADDENDUM*** CREATED: [DATE] [DATE]

BUN and creatinine were obtained on site at [HOSPITAL] at
[HOSPITAL].
Results:  BUN 24 mg/dL,  Creatinine 1.0 mg/dL.
***END ADDENDUM*** SIGNED BY: MYM, M.D.
CLINICAL DATA: Inguinal pain
CT PELVIS WITHOUT CONTRAST
TECHNIQUE: Multidetector CT imaging of the pelvis was performed
following the standard protocol without intravenous contrast.

[Series 2: routine pelvis · axial · 0.82mm/px · z∈[-236,+29]mm · 14 of 61 slices shown, 16 images]
[im 4/61  soft-tissue]
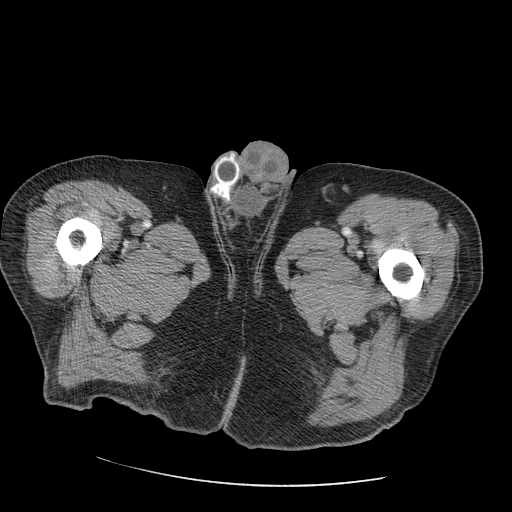
[im 4/61  bone]
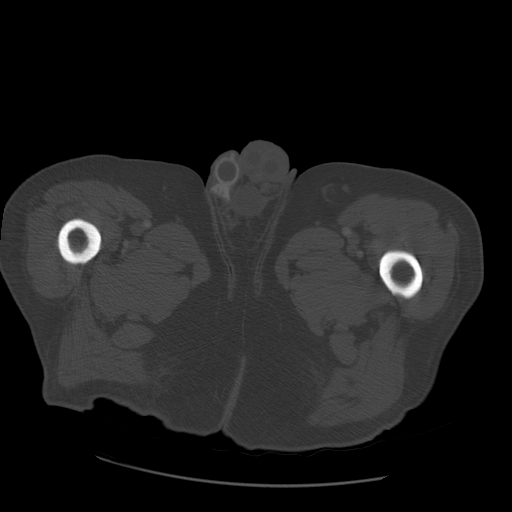
[im 8/61  soft-tissue]
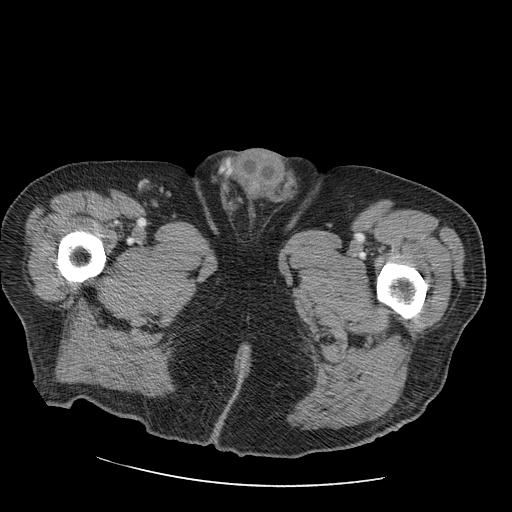
[im 12/61  soft-tissue]
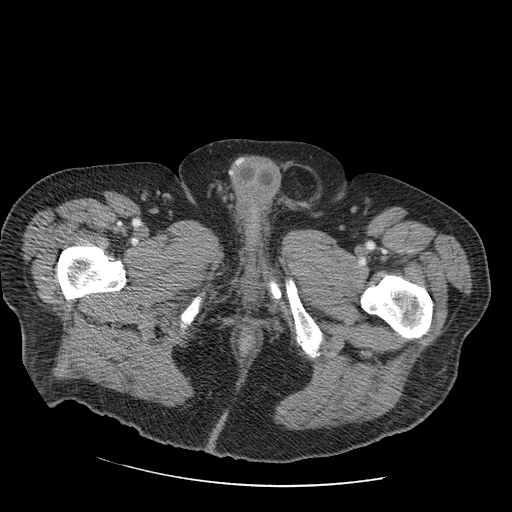
[im 16/61  soft-tissue]
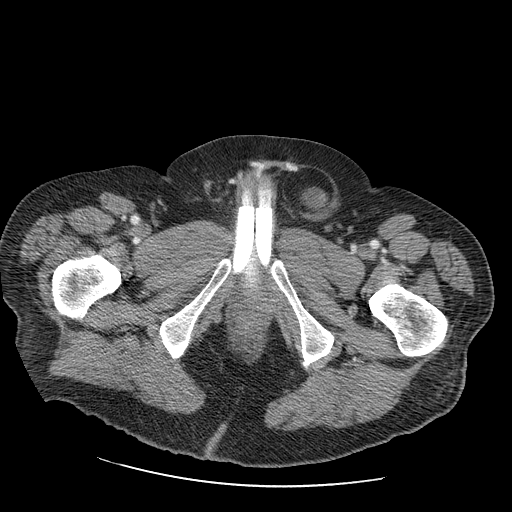
[im 20/61  soft-tissue]
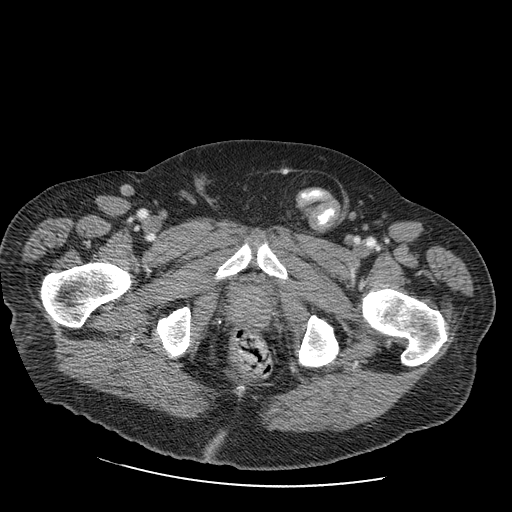
[im 24/61  soft-tissue]
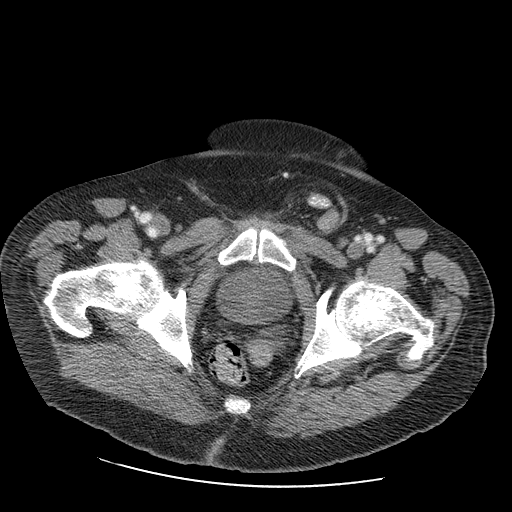
[im 28/61  soft-tissue]
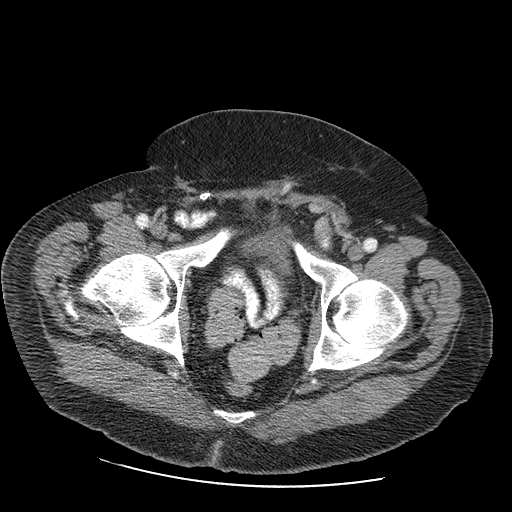
[im 33/61  soft-tissue]
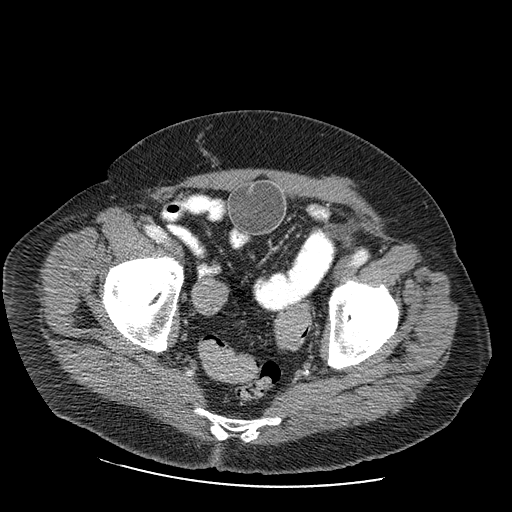
[im 37/61  soft-tissue]
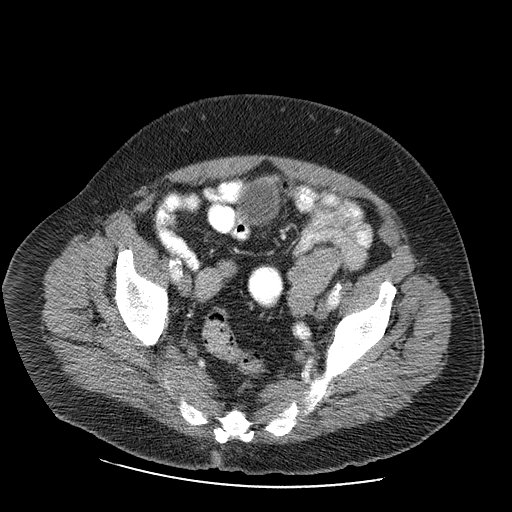
[im 37/61  bone]
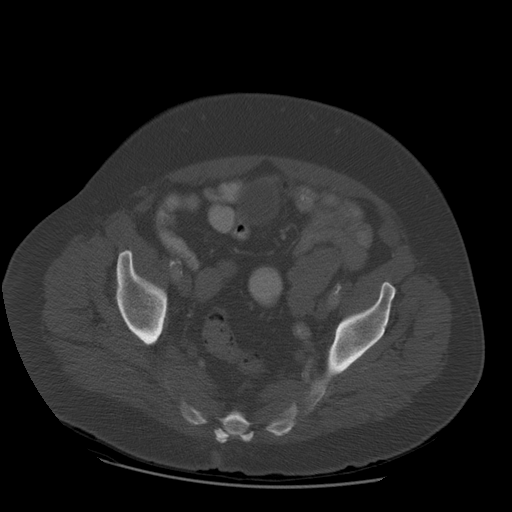
[im 41/61  soft-tissue]
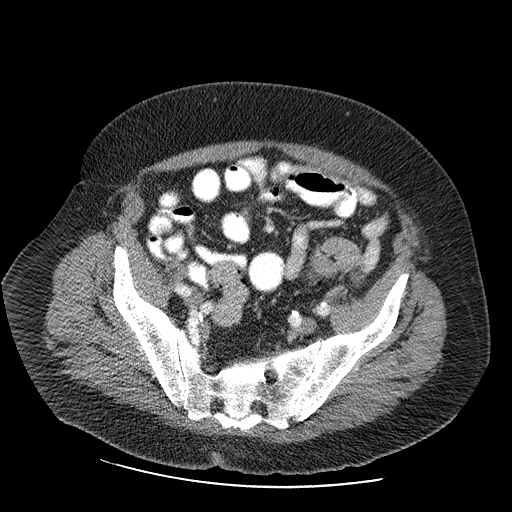
[im 45/61  soft-tissue]
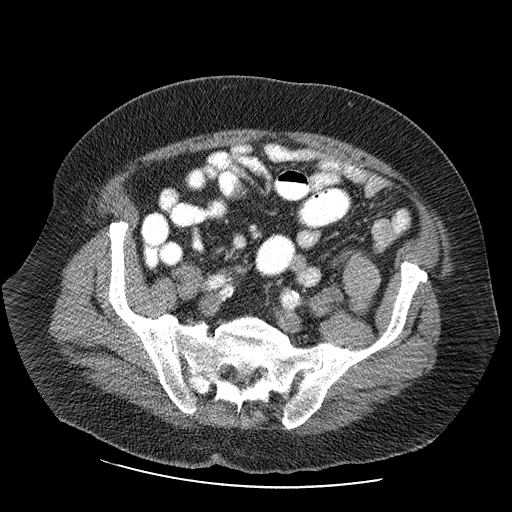
[im 49/61  soft-tissue]
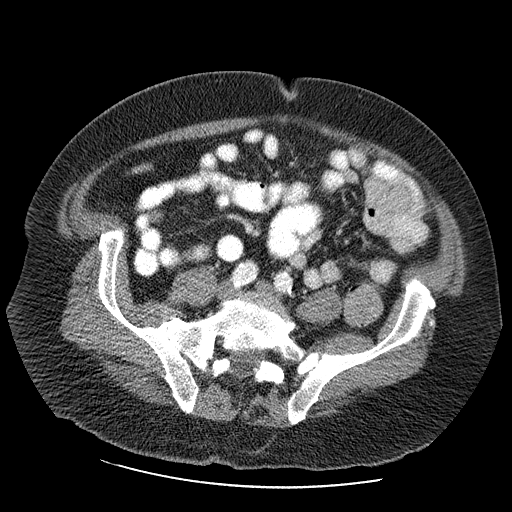
[im 53/61  soft-tissue]
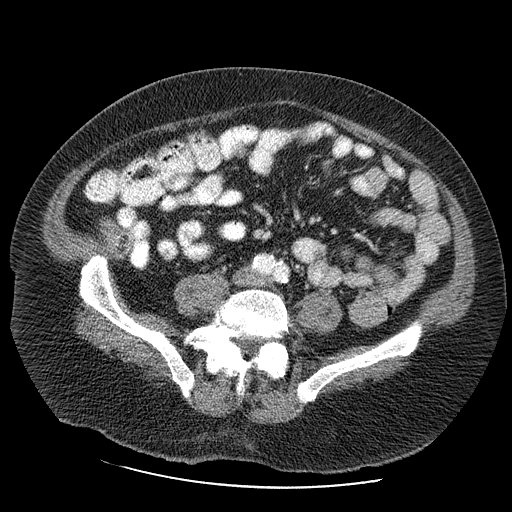
[im 57/61  soft-tissue]
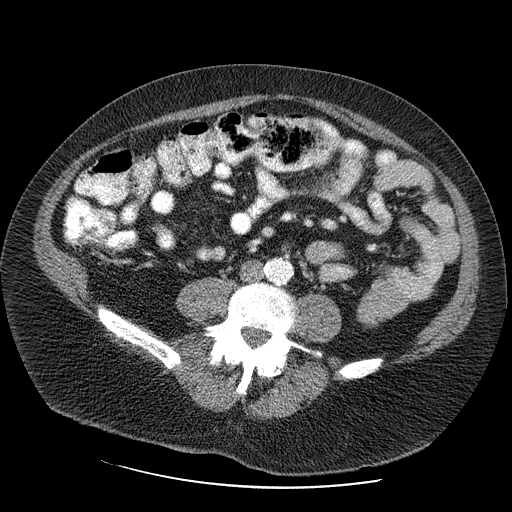

[Series 300: coronal · coronal · 0.82mm/px · 3 of 146 slices shown]
[im 49/146  soft-tissue]
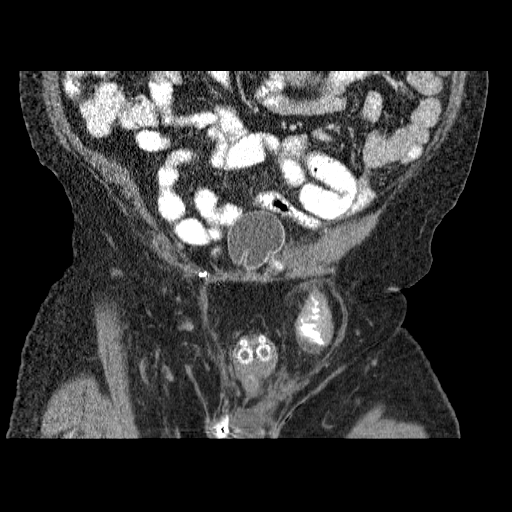
[im 65/146  soft-tissue]
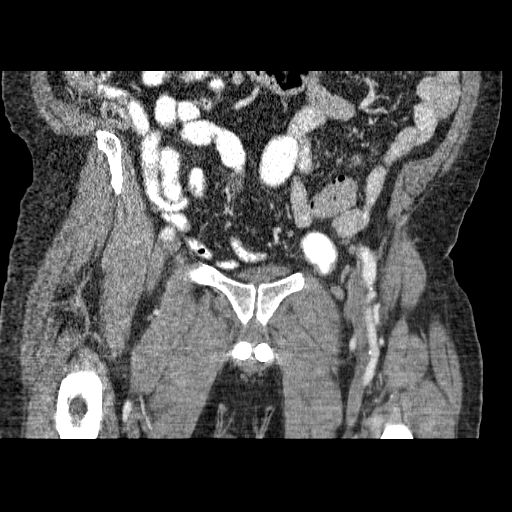
[im 81/146  soft-tissue]
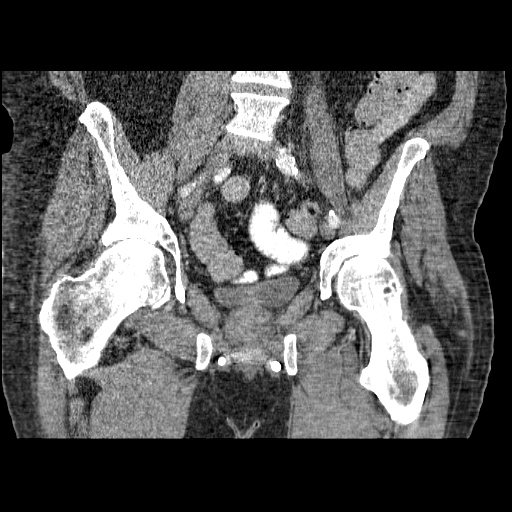

[17 of 46 positions shown; findings below may reference images not displayed]

FINDINGS: There is a left inguinal hernia containing a loop of
small bowel. The proximal loop of small bowel entering this hernia
is minimally dilated with the exiting loop decompressed.  This
suggests a minimal degree of obstruction.  No right inguinal
hernia.  There is suture material along the right inguinal fascia
consistent with a previous right inguinal herniorrhaphy.

A penile prosthesis is noted.

The bowel is otherwise unremarkable.  No pelvic masses.  No
adenopathy.  No abnormal fluid collections.  No osteoblastic or
osteolytic lesions.
IMPRESSION: Left inguinal hernia containing loop of small bowel with evidence
of a minimal degree of bowel obstruction.

## 2010-07-07 MED ORDER — IOHEXOL 300 MG/ML  SOLN
125.0000 mL | Freq: Once | INTRAMUSCULAR | Status: AC | PRN
  Administered 2010-07-07: 125 mL via INTRAVENOUS

## 2010-07-08 ENCOUNTER — Other Ambulatory Visit: Payer: Medicare Other

## 2010-07-08 ENCOUNTER — Telehealth: Payer: Self-pay | Admitting: Family Medicine

## 2010-07-08 ENCOUNTER — Other Ambulatory Visit: Payer: Self-pay | Admitting: Family Medicine

## 2010-07-08 DIAGNOSIS — I10 Essential (primary) hypertension: Secondary | ICD-10-CM

## 2010-07-08 DIAGNOSIS — E785 Hyperlipidemia, unspecified: Secondary | ICD-10-CM

## 2010-07-08 LAB — LIPID PANEL
Cholesterol: 160 mg/dL (ref 0–200)
HDL: 71 mg/dL
LDL Cholesterol: 79 mg/dL (ref 0–99)
Total CHOL/HDL Ratio: 2.3 ratio
Triglycerides: 50 mg/dL
VLDL: 10 mg/dL (ref 0–40)

## 2010-07-08 LAB — COMPREHENSIVE METABOLIC PANEL WITH GFR
ALT: 18 U/L (ref 0–53)
AST: 11 U/L (ref 0–37)
Albumin: 4 g/dL (ref 3.5–5.2)
Alkaline Phosphatase: 52 U/L (ref 39–117)
BUN: 22 mg/dL (ref 6–23)
CO2: 30 meq/L (ref 19–32)
Calcium: 9.7 mg/dL (ref 8.4–10.5)
Chloride: 103 meq/L (ref 96–112)
Creat: 0.94 mg/dL (ref 0.40–1.50)
Glucose, Bld: 208 mg/dL — ABNORMAL HIGH (ref 70–99)
Potassium: 4.2 meq/L (ref 3.5–5.3)
Sodium: 142 meq/L (ref 135–145)
Total Bilirubin: 0.7 mg/dL (ref 0.3–1.2)
Total Protein: 6.4 g/dL (ref 6.0–8.3)

## 2010-07-08 LAB — CBC
HCT: 38.1 % — ABNORMAL LOW (ref 39.0–52.0)
Hemoglobin: 12 g/dL — ABNORMAL LOW (ref 13.0–17.0)
MCHC: 31.5 g/dL (ref 30.0–36.0)
RDW: 12.8 % (ref 11.5–15.5)
WBC: 7.2 10*3/uL (ref 4.0–10.5)

## 2010-07-08 MED ORDER — BISOPROLOL-HYDROCHLOROTHIAZIDE 5-6.25 MG PO TABS: 1.0000 | ORAL_TABLET | Freq: Every day | ORAL | Status: DC

## 2010-07-08 MED ORDER — CETIRIZINE HCL 10 MG PO TABS: 10.0000 mg | ORAL_TABLET | Freq: Every day | ORAL | Status: DC

## 2010-07-08 MED ORDER — ENALAPRIL MALEATE 20 MG PO TABS: 20.0000 mg | ORAL_TABLET | Freq: Every day | ORAL | Status: DC

## 2010-07-08 MED ORDER — MONTELUKAST SODIUM 10 MG PO TABS: 10.0000 mg | ORAL_TABLET | Freq: Every day | ORAL | Status: DC

## 2010-07-08 MED ORDER — SIMVASTATIN 40 MG PO TABS: 40.0000 mg | ORAL_TABLET | Freq: Every day | ORAL | Status: DC

## 2010-07-08 MED ORDER — METFORMIN HCL ER (MOD) 1000 MG PO TB24: 1000.0000 mg | ORAL_TABLET | Freq: Every day | ORAL | Status: DC

## 2010-07-08 MED ORDER — GLYBURIDE 5 MG PO TABS: 5.0000 mg | ORAL_TABLET | Freq: Every day | ORAL | Status: DC

## 2010-07-08 NOTE — Progress Notes (Signed)
CBC.CMP AND FLP DONE TODAY Chad Hall

## 2010-07-08 NOTE — Telephone Encounter (Signed)
Pt calling for refill on meds.  New pt to provider, but need all meds refilled.  Asked pat to contact his pharmacy first then [place] if they won't send for requests.

## 2010-07-11 ENCOUNTER — Telehealth: Payer: Self-pay | Admitting: Family Medicine

## 2010-07-11 DIAGNOSIS — D649 Anemia, unspecified: Secondary | ICD-10-CM | POA: Insufficient documentation

## 2010-07-11 NOTE — Telephone Encounter (Signed)
Called pt back.  I would like him to come to [facility] for B12 level checked.  Low B12 can cause anemia.  Pt will call [facility] to make appt for lab.  If b12 normal, then will likely need colonscopy earlier than 2013.

## 2010-07-11 NOTE — Telephone Encounter (Signed)
Patient calling to let MD know his last colonoscopy was in 2008, due for another one in 2013

## 2010-07-11 NOTE — Telephone Encounter (Signed)
Called patient he just wanted to let Dr Janalyn Harder know when his last colonoscopy was done.Katherene Dinino, Rodena Medin

## 2010-07-11 NOTE — Progress Notes (Signed)
Addended by: Joshlyn Beadle P on: 07/11/2010 12:15 PM   Modules accepted: Orders

## 2010-07-16 ENCOUNTER — Encounter (INDEPENDENT_AMBULATORY_CARE_PROVIDER_SITE_OTHER): Payer: Self-pay | Admitting: General Surgery

## 2010-07-17 ENCOUNTER — Other Ambulatory Visit: Payer: Medicare Other

## 2010-07-18 ENCOUNTER — Other Ambulatory Visit: Payer: Medicare Other

## 2010-07-18 DIAGNOSIS — D649 Anemia, unspecified: Secondary | ICD-10-CM

## 2010-07-18 NOTE — Progress Notes (Signed)
b12 done today [facility] Anisten Tomassi

## 2010-07-22 ENCOUNTER — Other Ambulatory Visit: Payer: Self-pay | Admitting: Family Medicine

## 2010-07-22 ENCOUNTER — Telehealth: Payer: Self-pay | Admitting: *Deleted

## 2010-07-22 DIAGNOSIS — D649 Anemia, unspecified: Secondary | ICD-10-CM

## 2010-07-22 NOTE — Telephone Encounter (Signed)
Called pt and lmvm with appt info: DR.EDWARDS ([phone]) 07-24-10 AT 10:30 AM. .Chad Hall

## 2010-07-23 ENCOUNTER — Encounter: Payer: Self-pay | Admitting: Family Medicine

## 2010-07-23 ENCOUNTER — Ambulatory Visit (INDEPENDENT_AMBULATORY_CARE_PROVIDER_SITE_OTHER): Payer: Medicare Other | Admitting: Family Medicine

## 2010-07-23 DIAGNOSIS — D649 Anemia, unspecified: Secondary | ICD-10-CM

## 2010-07-23 DIAGNOSIS — H543 Unqualified visual loss, both eyes: Secondary | ICD-10-CM

## 2010-07-23 DIAGNOSIS — H547 Unspecified visual loss: Secondary | ICD-10-CM | POA: Insufficient documentation

## 2010-07-23 DIAGNOSIS — E119 Type 2 diabetes mellitus without complications: Secondary | ICD-10-CM

## 2010-07-23 DIAGNOSIS — I10 Essential (primary) hypertension: Secondary | ICD-10-CM

## 2010-07-23 DIAGNOSIS — E785 Hyperlipidemia, unspecified: Secondary | ICD-10-CM

## 2010-07-23 DIAGNOSIS — R1032 Left lower quadrant pain: Secondary | ICD-10-CM

## 2010-07-23 MED ORDER — BISOPROLOL-HYDROCHLOROTHIAZIDE 5-6.25 MG PO TABS: 1.0000 | ORAL_TABLET | Freq: Every day | ORAL | Status: DC

## 2010-07-23 MED ORDER — ENALAPRIL MALEATE 20 MG PO TABS: 20.0000 mg | ORAL_TABLET | Freq: Every day | ORAL | Status: DC

## 2010-07-23 MED ORDER — METFORMIN HCL 1000 MG PO TABS: 1000.0000 mg | ORAL_TABLET | Freq: Two times a day (BID) | ORAL | Status: DC

## 2010-07-23 NOTE — Assessment & Plan Note (Signed)
LDL 79, HDL 71, Trig 50 and at goal.  Pt not having problem with Simva 40mg  so we will continue this dose.

## 2010-07-23 NOTE — Assessment & Plan Note (Signed)
BP 167/78 and recheck was 140s/80.  Pt states that his pharmacy did not receive a refill for one of his blood pressure med, Ziac, so he has missed a few doses.  I refilled that today, along with Enalapril.  He will return to clinic in 3 mos for f/u.  Recent labs showed normal renal function.

## 2010-07-23 NOTE — Assessment & Plan Note (Addendum)
Recent labs show Hb 12.0, HCT 38.1, MCV 93.8, B12 248.  He has been on Metformin for many years and it may be the cause of his low-normal B12.  I am concerned for malignancy in a 60 y/o M with anemia.  He does not have abnormal weight loss. He had a colonoscopy in 08/1998 by Dr Carman Ching, which was normal.  He is due for another one.  I've sent a referral to Dr Randa Evens' office regarding my concern for anemia.  We discussed colonoscopy today and pt prefers this to be done in Sept because he is planning to have a hernia repair in July.

## 2010-07-23 NOTE — Assessment & Plan Note (Signed)
CT from 5/21 showed Left inguinal hernia containing loop of small bowel with evidence of a minimal degree of bowel obstruction.  Pt has been seen by Dr Andrey Campanile, of [facility], who performed previous hernia repair.  At this time they are planning for another hernia repair surgery in July 2012.

## 2010-07-23 NOTE — Progress Notes (Signed)
  Subjective:    Patient ID: Chad Hall, male    DOB: August 07, 1950, 60 y.o.   MRN: [id number]  HPI Pt is here for follow up of labs.  Recent labs showed anemia with Hb 12.0, HCT 38.1, MCV 93.8 and B12 240s.  Review of labs showed that H/H was 12.3/38.8 in 11/2009 and 15.3/45 in 05/2008.  We discussed that anemia is someone his age is concerning for malignancy, likely in the GIT.  I recommend that pt get another colonoscopy, last one in 2000.  I've place a GI referral request for Dr Randa Evens, who performed last c-scope.   HYPERTENSION: Meds: Enalapril 20mg , Ziac (Bisopolol5mg -HCTZ 6.25mg ) Compliance: yes Tobacco use: no Chest pain: no  Syncope: no   Dyspnea:no  Palpitations: no  LE edema: no    LLQ pain/Left inguinal pain: Pt continues to have L inguinal pain, esp when he is standing for a long amt of time at work. He has seen Dr Andrey Campanile, and they plan to have surgery in July for L inguinal hernia repair.  Although pain is bothersome, it is not excruciating and pt can still work.  He denies bloody stools or hematuria. He denies nausea, vomiting, fever, chills.    Diabetes: Meds: Metformin 1000mg  bid Compliance: yes Symptoms of hypoglycemia: no CBGs: did not bring meter  HLD: Med: Simvastatin 40mg  daily Compliance: yes Prob taking meds: no  Review of Systems Per hpi     Objective:   Physical Exam  Constitutional: He is oriented to person, place, and time. He appears well-developed and well-nourished. No distress.  Cardiovascular: Normal rate, regular rhythm, normal heart sounds and intact distal pulses.   No murmur heard. Pulmonary/Chest: Effort normal and breath sounds normal. He has no wheezes.  Abdominal: Soft. Bowel sounds are normal. He exhibits no distension. There is tenderness.       +tenderness Left inguina, no hernia palpated   Musculoskeletal: Normal range of motion. He exhibits no edema.  Neurological: He is alert and oriented to person, place, and time.           Assessment & Plan:

## 2010-07-23 NOTE — Assessment & Plan Note (Signed)
A1C 7.0 and at goal of <7.  Will continue metformin 1000mg  bid.  Renal function wnl.

## 2010-07-24 ENCOUNTER — Telehealth: Payer: Self-pay | Admitting: *Deleted

## 2010-07-24 NOTE — Telephone Encounter (Signed)
Do you know anything about this?  I did not know that he had appt with Dr Randa Evens. If he missed it, it was not his fault.  Please call Dr Randa Evens' office and explain the situation with them.  Please reschedule an appt for pt in Sept (per pt's preference). Thank you.

## 2010-07-24 NOTE — Telephone Encounter (Signed)
Called pt and lmvm to call back. I did leave a detailed message for pt after I've scheduled the OV with Dr.Edwards (07-24-10 at 10:30am).  Waiting for pt to call back. Lorenda Hatchet, Renato Battles

## 2010-07-24 NOTE — Telephone Encounter (Signed)
Returning call.

## 2010-07-24 NOTE — Telephone Encounter (Signed)
Received call from [facility]  stating patient " No Showed  "for his appointment with Dr. Randa Evens today. Will forward message to Dr. Janalyn Harder.

## 2010-07-25 ENCOUNTER — Telehealth: Payer: Self-pay | Admitting: Family Medicine

## 2010-07-25 NOTE — Telephone Encounter (Signed)
Called pt and spoke with wife. Explained that I have scheduled appt with Dr.Edwards and left detailed message on his phone. Pt can call Dr.Edward's office and schedule his appt, because he is a patient there. Pt's wife will give him the message. Lorenda Hatchet, Renato Battles

## 2010-07-25 NOTE — Telephone Encounter (Signed)
Returning call.

## 2010-08-21 ENCOUNTER — Encounter (HOSPITAL_COMMUNITY)
Admission: RE | Admit: 2010-08-21 | Discharge: 2010-08-21 | Disposition: A | Discharge status: Home / Self Care | Payer: Medicare Other | Source: Ambulatory Visit | Attending: General Surgery | Admitting: General Surgery

## 2010-08-21 ENCOUNTER — Telehealth (INDEPENDENT_AMBULATORY_CARE_PROVIDER_SITE_OTHER): Payer: Self-pay | Admitting: General Surgery

## 2010-08-21 LAB — URINALYSIS, ROUTINE W REFLEX MICROSCOPIC
Glucose, UA: NEGATIVE mg/dL
Hgb urine dipstick: NEGATIVE
Ketones, ur: NEGATIVE mg/dL
Protein, ur: NEGATIVE mg/dL
pH: 5 (ref 5.0–8.0)

## 2010-08-21 LAB — CBC
HCT: 36.7 % — ABNORMAL LOW (ref 39.0–52.0)
Hemoglobin: 12 g/dL — ABNORMAL LOW (ref 13.0–17.0)
MCH: 29.4 pg (ref 26.0–34.0)
MCHC: 32.7 g/dL (ref 30.0–36.0)
MCV: 90 fL (ref 78.0–100.0)
RDW: 11.9 % (ref 11.5–15.5)

## 2010-08-21 LAB — BASIC METABOLIC PANEL
BUN: 20 mg/dL (ref 6–23)
CO2: 31 mEq/L (ref 19–32)
Calcium: 10.4 mg/dL (ref 8.4–10.5)
Chloride: 104 mEq/L (ref 96–112)
Creatinine, Ser: 0.92 mg/dL (ref 0.50–1.35)
Glucose, Bld: 205 mg/dL — ABNORMAL HIGH (ref 70–99)

## 2010-08-21 LAB — URINE MICROSCOPIC-ADD ON

## 2010-08-21 LAB — DIFFERENTIAL
Basophils Absolute: 0 10*3/uL (ref 0.0–0.1)
Eosinophils Relative: 3 % (ref 0–5)
Lymphocytes Relative: 34 % (ref 12–46)
Monocytes Absolute: 0.8 10*3/uL (ref 0.1–1.0)
Monocytes Relative: 11 % (ref 3–12)
Neutro Abs: 3.8 10*3/uL (ref 1.7–7.7)

## 2010-08-21 LAB — SURGICAL PCR SCREEN
MRSA, PCR: NEGATIVE
Staphylococcus aureus: NEGATIVE

## 2010-08-21 NOTE — Telephone Encounter (Signed)
Spoke with Amil Amen @[facility] and made aware that patient is okay for surgery from his pre-op lab results.

## 2010-08-21 NOTE — Telephone Encounter (Signed)
Message copied by Liliana Cline on Thu Aug 21, 2010 11:03 AM ------      Message from: Andrey Campanile, ERIC M      Created: Thu Aug 21, 2010 10:20 AM       Labs ok for surgery

## 2010-08-25 ENCOUNTER — Ambulatory Visit (HOSPITAL_COMMUNITY)
Admission: RE | Admit: 2010-08-25 | Discharge: 2010-08-25 | Disposition: A | Discharge status: Home / Self Care | Payer: Medicare Other | Source: Ambulatory Visit | Attending: General Surgery | Admitting: General Surgery

## 2010-08-25 DIAGNOSIS — E119 Type 2 diabetes mellitus without complications: Secondary | ICD-10-CM | POA: Insufficient documentation

## 2010-08-25 DIAGNOSIS — K4091 Unilateral inguinal hernia, without obstruction or gangrene, recurrent: Secondary | ICD-10-CM

## 2010-08-25 DIAGNOSIS — Z01812 Encounter for preprocedural laboratory examination: Secondary | ICD-10-CM | POA: Insufficient documentation

## 2010-08-25 DIAGNOSIS — I10 Essential (primary) hypertension: Secondary | ICD-10-CM | POA: Insufficient documentation

## 2010-08-25 DIAGNOSIS — Z5331 Laparoscopic surgical procedure converted to open procedure: Secondary | ICD-10-CM | POA: Insufficient documentation

## 2010-08-25 HISTORY — PX: INGUINAL HERNIA REPAIR: SUR1180

## 2010-08-25 LAB — GLUCOSE, CAPILLARY
Glucose-Capillary: 179 mg/dL — ABNORMAL HIGH (ref 70–99)
Glucose-Capillary: 216 mg/dL — ABNORMAL HIGH (ref 70–99)

## 2010-09-03 NOTE — Op Note (Signed)
NAME:  Chad Hall, Chad Hall NO.:  [id number]  MEDICAL RECORD NO.:  [id number]  LOCATION:  [facility]                         FACILITY:  [facility]  PHYSICIAN:  Mary Sella. Andrey Campanile, MD     DATE OF BIRTH:  06/18/1950  DATE OF PROCEDURE:  08/25/2010 DATE OF DISCHARGE:  08/25/2010                              OPERATIVE REPORT   PREOPERATIVE DIAGNOSIS:  Recurrent left inguinal hernia.  POSTOPERATIVE DIAGNOSIS:  Recurrent left direct inguinal hernia.  PROCEDURE:  Laparoscopic converted to open repair of recurrent left direct inguinal hernia with mesh.  SURGEON:  Mary Sella. Andrey Campanile, MD  ASSISTANT SURGEON:  Angelia Mould. Derrell Lolling, MD  ANESTHESIA:  General plus 30 mL of Exparel.  ESTIMATED BLOOD LOSS:  Minimal.  COMPLICATIONS:  None immediately apparent.  FINDINGS:  The patient had a direct recurrence.  The indirect repair appeared intact.  I felt that it cannot be repaired laparoscopic given the presence of the reservoir for his penile prosthesises within the abdominal cavity where I would need to place the mesh laparoscopic, therefore I converted to an open repair.  INDICATIONS FOR PROCEDURE:  The patient is a very pleasant 60 year old overweight African American male with diabetes mellitus and Marfan syndrome.  He had underwent an open repair of an incarcerated left pantaloon hernia in October by me.  It was a very large direct defect as well as an indirect defect as well.  I used a Bard plug as well as an Engineer, drilling as an overlay.  Unfortunately had an early recurrence.  We discussed the risks and benefits of repair of a recurrent hernia.  We discussed specifically the risk of bleeding, infection, injury to surrounding structures such as bowel or bladder or his penile prosthesis, nerve entrapment, chronic inguinal pain, testicular loss, hernia recurrence, DVT occurrence, urinary retention, seroma or hematoma formation and mesh complications.  We discussed both laparoscopic  as well as open repair.  We discussed the benefits of a laparoscopic approach.  However, we discussed the distinct possibility that he may be not be an ideal laparoscopic candidate due to the proximity of his reservoir for his penile prosthesis.  We discussed that the same possibility of having to convert to an open procedure.  I explained to him that he was at a risk for recurrence given his weight, his underlying diabetes mellitus and his Marfan syndrome.  He elects to proceed to surgery.  DESCRIPTION OF PROCEDURE:  After obtaining informed consent and marking the operative site in the holding area with the patient confirming, the patient was taken back to the operating room and placed supine on the operating room table.  General endotracheal anesthesia was established, surgical time-out was performed.  Sequential compression devices were placed.  A Foley catheter was attempted to be placed however, I met a little resistance, so I aborted the Foley catheter placement.  He had hypospadias. His abdomen and groin were then prepped in usual standard surgical fashion with ChloraPrep.  He received antibiotics prior to skin incision.  A 1- inch vertical infraumbilical incision was made with a #11 blade.  The fascia was grasped and lifted anteriorly.  The fascia was incised with an overlay 11 blade in  the pursestring suture consisting of 0 Vicryl on an UR-6 needle was placed around the fascial edges.  The Hasson trocar was placed directly into the abdominal cavity.  Pneumoperitoneum was smoothly established up to a patient pressure of 15 mmHg.  Laparoscope was advanced.  Abdominal cavity was surveilled.  There was no evidence of injury to surrounding structures.  When viewed laparoscopic, there was no evidence of right inguinal hernia. On the left,  The indirect repair was intact.  However there was a defect medial to the inferior of the gastric vessels consistent with a direct recurrence.  The  reservoir was directly medial to the direct defect within 1 cm.  The reservoir was larger than the size of a golf ball and it was preperitoneal but the peritoneum was somewhat stretched over it.  The plug from the prior repair had migrated lateral to the repair.  I felt that I could not create a pocket where I would have ample room medially to tack it, more I was afraid that I would damage the reservoir in taking down the peritoneum and I was unsure if I could get the peritoneum off of the reservoir thus reducing my ability to place the mesh in a TAPP fashion.  Therefore I elected to convert him to an open procedure.  I incised his previous left groin incision with a #10 plate, divided the subcutaneous tissue with electrocautery.  Subcutaneous tissue was about 4 inches deep.  I divided the Scarpa fascia, placed the Gelpi retractor. I was able to dissect down and identified the external oblique aponeurosis.  I was able to lift the subcutaneous tissue fat off the external oblique aponeurosis.  I then incised the external oblique aponeurosis near the ring.  I then placed a hemostat on each edge of the external oblique aponeurosis.  The UltraPro mesh from the prior repair had been incorporated at this point into the external oblique aponeurosis out laterally, medially though I was able to create a plane and dissect the UltraPro mesh down and away.  Dr. Derrell Lolling joined me in the operating room at this point.  I was able to find the direct defect. I was able to lift it up from around the surrounding structures and isolated by cauterizing its loose attachments.  The cord structures were identified and I was able to lift them up and away with my index finger and wrapped a Penrose drain around them.  As stated before the indirect repair was still intact as well as when viewed on the outside of the abdomen.  The direct defect was identified.  I was able to isolate the direct hernia and reduce it from  its surrounding structures and reduced it back in abdominal cavity.  I identified the edges of the conjoint tendon, Cooper ligament and the shelving edge of inguinal ligament.  I could see where my prior UltraPro mesh had been sewn into the shelving edge of the inguinal ligament as well as the conjoint tendon.  I was able to lift up and create space on top of the conjoint tendon both medially.  At this point we discussed several different repairs.  I was afraid that a primary tissue repair would not be long lasting due to his underlying Marfan syndrome.  Therefore I elected to use another plug.  I obtained a piece of Bard medium plug, placed it within the defect, then I sutured it to the conjoint tendon superior medially in 3 places, I sewed it medially to the pubic  tubercle in 2 places and to Benbrook ligament in 2 places and lateral in 2 places to the previous UltraPro mesh that had been incorporated.  This was done with interrupted 0 Prolene sutures.  It was anchored in multiple places.  At this point I obtained a piece of Ethicon UltraPro mesh 3 inches x 6 inches, trimmed it slightly and made a slit in the mesh to accommodate the cord structures.  The mesh was then overlaid on top of the plug medially and carried out laterally.  It was circumferentially sutured in an interrupted fashion to the conjoint tendon into the pubic tubercle and inferiorly to the shelving edge of the inguinal ligament in multiple places.  This was carried out inferior laterally and sutured to the shelving edge of the inguinal ligament.  I then sutured it also laterally to the previous UltraPro mesh that had been incorporated into the inguinal floor out laterally.  The tails of the mesh were then reapproximated out lateral past the cord structures and with interrupted 0 Prolene as well.  The cord structures were snugged.  The excess mesh was then trimmed.  I then injected Exparel into the conjoined tendon and the  shelving edge and some into the cord structures aspirating before injected.  I then reapproximated the external oblique aponeurosis with a running 2-0 Vicryl and then reapproximated Scarpa fascia with inverted interrupted 3-0 Vicryl sutures.  Additional Exparel was injected.  I then reapproximated the skin with a running 4-0 Monocryl in a subcuticular fashion.  I then went back in laparoscopic and inspected the repair. The direct hernia was reduced.  There was no evidence of any defect when viewed laparoscopic at this point.  The Hasson trocar was removed.  The previously placed pursestring suture was tied down.  There was a small gap at the fascia of the umbilicus.  I placed 2 additional interrupted 0 Vicryl sutures thus obliterating the fascial defect.  The remaining trocar was removed from the right side of the abdominal wall. There were 2 remaining skin incisions that were closed with a 4-0 Monocryl in a subcuticular fashion followed by the application of Dermabond to all skin incisions.  The patient was extubated, taken to recovery room in stable condition.  There are no immediate complications.  The patient tolerated the procedure well.     Mary Sella. Andrey Campanile, MD     EMW/MEDQ  D:  08/25/2010  T:  08/25/2010  Job:  [id number]  Electronically Signed by Gaynelle Adu M.D. on 09/03/2010 11:37:55 PM

## 2010-09-09 ENCOUNTER — Telehealth (INDEPENDENT_AMBULATORY_CARE_PROVIDER_SITE_OTHER): Payer: Self-pay

## 2010-09-09 NOTE — Telephone Encounter (Signed)
Patient's sister called to check on patient's legs. Stated before surgery that her brother's legs were very black, wanted to know with his diabetes if she should be worried. I explained to patient that I work in the office and not the hospital so I wasn't there. I did let her know that if she was worried about his diabetes he should follow up with his primary care MD.

## 2010-09-09 NOTE — Telephone Encounter (Signed)
Pt's sister Vernona Rieger called requesting to speak to [facility] b/c she has some questions about pt having hernia repair./ [facility]

## 2010-09-15 ENCOUNTER — Encounter (INDEPENDENT_AMBULATORY_CARE_PROVIDER_SITE_OTHER): Payer: Self-pay | Admitting: General Surgery

## 2010-09-17 ENCOUNTER — Ambulatory Visit (INDEPENDENT_AMBULATORY_CARE_PROVIDER_SITE_OTHER): Payer: Medicare Other | Admitting: General Surgery

## 2010-09-17 ENCOUNTER — Encounter (INDEPENDENT_AMBULATORY_CARE_PROVIDER_SITE_OTHER): Payer: Self-pay | Admitting: General Surgery

## 2010-09-17 DIAGNOSIS — Z09 Encounter for follow-up examination after completed treatment for conditions other than malignant neoplasm: Secondary | ICD-10-CM

## 2010-09-17 DIAGNOSIS — Z9889 Other specified postprocedural states: Secondary | ICD-10-CM

## 2010-09-17 NOTE — Patient Instructions (Signed)
Do not lift anything greater than 10 pounds until after August 20th.  Do not stand for prolonged periods of time.

## 2010-09-17 NOTE — Progress Notes (Signed)
Procedure: Status post laparoscopic converted to an open repair of recurrent left direct inguinal hernia with mesh August 25, 2010  History of Present Ilness: 60 year old [place] American male comes in today for his first postoperative appointment after undergoing the above mentioned procedure. He states that he did well initially after surgery. He had to take the pain medicine for about a week. He is no longer taking any pain medication. He denies any fevers, chills, nausea, emesis, diarrhea, or constipation. He reports normal urination. He only complains of some intermittent left upper inner thigh discomfort. However he states that it's gradually improving.  Physical Exam: There were no vitals taken for this visit.  Well-developed well-nourished African American male in no apparent distress Pulmonary-lungs are clear Cardiac-regular rate and rhythm Abdomen-soft, nontender, nondistended. GU-well-healed left inguinal incision. No hematoma or seroma. Both testicles are descended. Penile pump on the right. No scrotal masses. No signs of hernia recurrence  Data reviewed: I reviewed my operative note  Assessment and Plan: Status post laparoscopic converted to open repair of left recurrent direct inguinal hernia with mesh  Appears to be doing well. I advised him that intermittent left groin discomfort should improve with time.  I'm going to allow him to return to work next Monday with restrictions. He should not lift anything greater than 10 pounds or standing for prolonged periods of time until after August 20.  I will see him in 6 weeks.

## 2010-10-29 ENCOUNTER — Encounter (INDEPENDENT_AMBULATORY_CARE_PROVIDER_SITE_OTHER): Payer: Self-pay | Admitting: General Surgery

## 2010-10-29 ENCOUNTER — Ambulatory Visit (INDEPENDENT_AMBULATORY_CARE_PROVIDER_SITE_OTHER): Payer: Medicare Other | Admitting: General Surgery

## 2010-10-29 VITALS — BP 138/82 | HR 72

## 2010-10-29 DIAGNOSIS — Z09 Encounter for follow-up examination after completed treatment for conditions other than malignant neoplasm: Secondary | ICD-10-CM

## 2010-10-29 NOTE — Progress Notes (Signed)
Procedure: Status post laparoscopic converted to an open repair of recurrent left direct inguinal hernia with mesh August 25, 2010  History of Present Ilness: 60 year old [place] American male comes in today for his second postoperative appointment after undergoing the above mentioned procedure. He states that he has done well since his last visit.  He is no longer taking any pain medication. He denies any fevers, chills, nausea, emesis, diarrhea, or constipation. He reports normal urination. He only complains of some "tension" in his left inguinal area after standing for a prolonged period of time.  Physical Exam: BP 138/82  Pulse 72  Well-developed well-nourished African American male in no apparent distress Pulmonary-lungs are clear Cardiac-regular rate and rhythm Abdomen-soft, nontender, nondistended. GU-well-healed left inguinal incision. No hematoma or seroma. Both testicles are descended. Penile pump on the right. No scrotal masses. No signs of hernia recurrence  Data reviewed: I reviewed my operative note  Assessment and Plan: Status post laparoscopic converted to open repair of left recurrent direct inguinal hernia with mesh  Appears to be doing well. I advised him that intermittent left groin discomfort should improve with time.  I'm going to allow him to slowly resume full activity.  I will see him in 6 months.

## 2010-10-29 NOTE — Patient Instructions (Signed)
Can resume gym activities but start with light weights

## 2010-10-30 ENCOUNTER — Ambulatory Visit (INDEPENDENT_AMBULATORY_CARE_PROVIDER_SITE_OTHER): Payer: Medicare Other | Admitting: Family Medicine

## 2010-10-30 ENCOUNTER — Telehealth: Payer: Self-pay | Admitting: Family Medicine

## 2010-10-30 DIAGNOSIS — E119 Type 2 diabetes mellitus without complications: Secondary | ICD-10-CM

## 2010-10-30 DIAGNOSIS — R1032 Left lower quadrant pain: Secondary | ICD-10-CM

## 2010-10-30 DIAGNOSIS — I10 Essential (primary) hypertension: Secondary | ICD-10-CM

## 2010-10-30 LAB — POCT GLYCOSYLATED HEMOGLOBIN (HGB A1C): Hemoglobin A1C: 7.8

## 2010-10-30 MED ORDER — BISOPROLOL-HYDROCHLOROTHIAZIDE 10-6.25 MG PO TABS: 1.0000 | ORAL_TABLET | Freq: Every day | ORAL | Status: DC

## 2010-10-30 NOTE — Assessment & Plan Note (Signed)
[name] left inguinal hernia was repaired by Dr. Gaynelle Adu in July 2012. Since then, the pain has been decreasing and Dr. Andrey Campanile has advised that is should continue to do so. It only bothers him after standing for a long duration.

## 2010-10-30 NOTE — Assessment & Plan Note (Signed)
Increase Bisoprolol-HCTZ to 10-6.25 from 5-6.25 given elevated BP today. Follow-up in 3 months.

## 2010-10-30 NOTE — Telephone Encounter (Signed)
Need a work note faxed to employer at fax# [phone]

## 2010-10-30 NOTE — Assessment & Plan Note (Signed)
Patient's HbA1C today is 7.8, which is up from 7.0 at his previous visit on 07/23/10. His medical regimen continues to be Metformin 1000mg  BID and Glyburide 5mg  daily, but his exercise has decreased as a result of his surgery. Chad Hall prefers not to increase any medcation, but rather resume his exercise in an attempt to control his diabetes. I am in agreement with this plan. We will recheck the A1C in 3 months and make appropriate adjustments as necessary.

## 2010-10-31 NOTE — Telephone Encounter (Signed)
Note faxed to pt's work.Chad Hall

## 2010-11-06 NOTE — Progress Notes (Signed)
  Subjective:    Patient ID: Chad Hall, male    DOB: [birth year], 60 y.o.   MRN: [id number]  HPI Chad Hall is a 60 year old man with type 2 diabetes and HTN who presents for follow-up after a recent left inguinal hernia repair. His left inguinal pain has decreased since the surgery, but still occurs when standing for long periods of time.   HYPERTENSION  BP Readings from Last 3 Encounters:  10/30/10 151/76  10/29/10 138/82  07/23/10 167/78    Hypertension ROS: taking medications as instructed, no medication side effects noted, no TIA's, no chest pain on exertion, no dyspnea on exertion, noting swelling of ankles and no intermittent claudication symptoms.   DIABETES  Taking and tolerating: yes - Metformin and Glyburide Fasting blood sugars:not taken Hypoglycemic symptoms: no Visual problems: blind since childhood accident  Monitoring feet: yes  Numbness/Tingling: no Last eye exam: unknown Diabetic Labs:  Lab Results  Component Value Date   HGBA1C 7.8 10/30/2010   HGBA1C 7.0 06/13/2010   Lab Results  Component Value Date   LDLCALC 79 07/08/2010   CREATININE 0.92 08/21/2010   Last microalbumin: No results found for this basename: MICROALBUR, [id number]       Review of Systems  All other systems reviewed and are negative.       Objective:   Physical Exam  Constitutional: He appears well-developed and well-nourished. No distress.  HENT:  Head: Normocephalic and atraumatic.  Cardiovascular: Normal rate, regular rhythm and intact distal pulses.   No murmur heard. Pulses:      Dorsalis pedis pulses are 2+ on the right side, and 2+ on the left side.  Pulmonary/Chest: Effort normal and breath sounds normal. No apnea. No respiratory distress.  Abdominal: Soft. Bowel sounds are normal. He exhibits no abdominal bruit. There is no hepatosplenomegaly. There is no tenderness.  Musculoskeletal:       Long toe nails on all digits bilaterally, making contact with skin of  distal toe but no ulcers; sensation intact on feet  Skin: Skin is warm and dry. No rash noted.          Assessment & Plan:  Chad Hall is showing poor control of his hypertension and diabetes at this time.

## 2010-11-07 ENCOUNTER — Encounter: Payer: Self-pay | Admitting: Family Medicine

## 2011-02-23 ENCOUNTER — Other Ambulatory Visit: Payer: Self-pay | Admitting: *Deleted

## 2011-02-23 ENCOUNTER — Telehealth: Payer: Self-pay | Admitting: *Deleted

## 2011-02-23 DIAGNOSIS — I10 Essential (primary) hypertension: Secondary | ICD-10-CM

## 2011-02-23 MED ORDER — GLYBURIDE 5 MG PO TABS: 5.0000 mg | ORAL_TABLET | Freq: Every day | ORAL | Status: DC

## 2011-02-23 MED ORDER — BISOPROLOL-HYDROCHLOROTHIAZIDE 10-6.25 MG PO TABS: 1.0000 | ORAL_TABLET | Freq: Every day | ORAL | Status: DC

## 2011-03-23 ENCOUNTER — Other Ambulatory Visit: Payer: Self-pay | Admitting: *Deleted

## 2011-03-23 DIAGNOSIS — I10 Essential (primary) hypertension: Secondary | ICD-10-CM

## 2011-03-23 MED ORDER — BISOPROLOL-HYDROCHLOROTHIAZIDE 10-6.25 MG PO TABS: 1.0000 | ORAL_TABLET | Freq: Every day | ORAL | Status: DC

## 2011-03-24 ENCOUNTER — Other Ambulatory Visit: Payer: Self-pay | Admitting: Family Medicine

## 2011-03-24 DIAGNOSIS — I10 Essential (primary) hypertension: Secondary | ICD-10-CM

## 2011-03-24 MED ORDER — GLYBURIDE 5 MG PO TABS: 5.0000 mg | ORAL_TABLET | Freq: Every day | ORAL | Status: DC

## 2011-03-24 MED ORDER — ENALAPRIL MALEATE 20 MG PO TABS: 20.0000 mg | ORAL_TABLET | Freq: Every day | ORAL | Status: DC

## 2011-03-24 NOTE — Progress Notes (Signed)
Please call patient and tell him that he will need to have an appointment for diabetes and HTN check-up in the next 30 days. Thank you.

## 2011-03-25 ENCOUNTER — Other Ambulatory Visit: Payer: Self-pay | Admitting: Family Medicine

## 2011-03-25 NOTE — Telephone Encounter (Signed)
Refill request

## 2011-04-02 ENCOUNTER — Encounter (INDEPENDENT_AMBULATORY_CARE_PROVIDER_SITE_OTHER): Payer: Self-pay | Admitting: General Surgery

## 2011-04-03 ENCOUNTER — Ambulatory Visit (INDEPENDENT_AMBULATORY_CARE_PROVIDER_SITE_OTHER): Payer: Medicare Other | Admitting: Family Medicine

## 2011-04-03 ENCOUNTER — Encounter: Payer: Self-pay | Admitting: Family Medicine

## 2011-04-03 DIAGNOSIS — I10 Essential (primary) hypertension: Secondary | ICD-10-CM

## 2011-04-03 DIAGNOSIS — E119 Type 2 diabetes mellitus without complications: Secondary | ICD-10-CM

## 2011-04-03 DIAGNOSIS — I872 Venous insufficiency (chronic) (peripheral): Secondary | ICD-10-CM

## 2011-04-03 MED ORDER — GLYBURIDE 5 MG PO TABS: 10.0000 mg | ORAL_TABLET | Freq: Every day | ORAL | Status: DC

## 2011-04-03 NOTE — Assessment & Plan Note (Signed)
Hb A1C today is 8.6, previous 7.8, 7.0; patient thinks this may be due to worsening diet and decreased exercise his winter - increase glyburide to 10 mg q AM - re-check A1C in 3 months; also CMP in may at next follow-up

## 2011-04-03 NOTE — Assessment & Plan Note (Signed)
Mild hemosiderin deposition and ankle swelling bilaterally; if worsening or bothersome in the future, then given TED hose; no intervention now.

## 2011-04-03 NOTE — Progress Notes (Signed)
  Subjective:    Patient ID: Chad Hall, male    DOB: [birth year], 61 y.o.   MRN: [id number]  HPI 1. Cold - itchy throat, cough, no fever, fatigued, took alka-seltzer tablets without relief, 1 week duration, wife also with similar symptoms   2. HTN -   Subjective: took at home this AM and it was 116/51; 172/86 at first measure in the office; haven't missed any doses   BP Readings from Last 3 Encounters:  04/03/11 140/80  10/30/10 151/76  10/29/10 138/82     Hypertension ROS: taking medications as instructed, no medication side effects noted, home BP monitoring in range of 120's systolic over 60's diastolic, no TIA's, no chest pain on exertion, no dyspnea on exertion and noting swelling of ankles.  New concerns: none  3.  DIABETES  Taking and tolerating: yes Fasting blood sugars: not taking  Hypoglycemic symptoms: no Visual problems: no Monitoring feet: yes Numbness/Tingling: minimal numbness with swelling  Last eye exam:patient blind Diabetic Labs:  Lab Results  Component Value Date   HGBA1C 7.8 10/30/2010   HGBA1C 7.0 06/13/2010   Lab Results  Component Value Date   LDLCALC 79 07/08/2010   CREATININE 0.92 08/21/2010      Review of Systems See HPI    Objective:   Physical Exam BP 140/80  Pulse 51  Ht 6' 5.5" (1.969 m)  Wt 256 lb (116.121 kg)  BMI 29.97 kg/m2 BMI elevated General: alert, oriented x 4, NAD, accompanied by guide dog Cardiac: RRR, no murmurs, no carotid bruits Extremities: 1+ swelling lower extremities bilat, hemosiderin deposition around ankles Feet: warm, well-perfused, sensation intact, no sores, ulcers, or callouses      Assessment & Plan:  Chad Hall is a pleasant 61 year old gentleman with worsening, but uncomplicated Type 2 DM, HTN, and mild venous insufficiency.

## 2011-04-03 NOTE — Patient Instructions (Signed)
We discussed the treatments orally since Mr. Glennon is blind.

## 2011-04-03 NOTE — Assessment & Plan Note (Signed)
Well-controlled on bisoprolol-HCTZ 10-6.25 and Enalapril 20 mg; continue with current regimen

## 2011-04-27 ENCOUNTER — Other Ambulatory Visit: Payer: Self-pay | Admitting: Family Medicine

## 2011-04-27 DIAGNOSIS — E1159 Type 2 diabetes mellitus with other circulatory complications: Secondary | ICD-10-CM

## 2011-04-27 DIAGNOSIS — I10 Essential (primary) hypertension: Secondary | ICD-10-CM

## 2011-04-27 NOTE — Telephone Encounter (Signed)
Refill request

## 2011-06-26 ENCOUNTER — Other Ambulatory Visit: Payer: Medicare Other

## 2011-06-26 LAB — COMPLETE METABOLIC PANEL WITH GFR
ALT: 18 U/L (ref 0–53)
AST: 17 U/L (ref 0–37)
Albumin: 4.1 g/dL (ref 3.5–5.2)
Alkaline Phosphatase: 54 U/L (ref 39–117)
BUN: 19 mg/dL (ref 6–23)
Creat: 1.07 mg/dL (ref 0.50–1.35)
Potassium: 4.3 mEq/L (ref 3.5–5.3)

## 2011-06-26 LAB — LIPID PANEL
Cholesterol: 138 mg/dL (ref 0–200)
LDL Cholesterol: 72 mg/dL (ref 0–99)
Total CHOL/HDL Ratio: 2.7 Ratio
VLDL: 15 mg/dL (ref 0–40)

## 2011-06-26 NOTE — Progress Notes (Signed)
cmp and flp done today Chad Hall 

## 2011-07-03 ENCOUNTER — Encounter: Payer: Medicare Other | Admitting: Home Health Services

## 2011-07-03 ENCOUNTER — Encounter: Payer: Self-pay | Admitting: Family Medicine

## 2011-07-03 ENCOUNTER — Ambulatory Visit: Payer: Medicare Other | Admitting: Family Medicine

## 2011-07-03 ENCOUNTER — Ambulatory Visit (INDEPENDENT_AMBULATORY_CARE_PROVIDER_SITE_OTHER): Payer: Medicare Other | Admitting: Family Medicine

## 2011-07-03 VITALS — BP 138/70 | HR 61 | Ht 77.5 in | Wt 249.0 lb

## 2011-07-03 DIAGNOSIS — J309 Allergic rhinitis, unspecified: Secondary | ICD-10-CM | POA: Insufficient documentation

## 2011-07-03 DIAGNOSIS — E119 Type 2 diabetes mellitus without complications: Secondary | ICD-10-CM

## 2011-07-03 DIAGNOSIS — I10 Essential (primary) hypertension: Secondary | ICD-10-CM

## 2011-07-03 DIAGNOSIS — E785 Hyperlipidemia, unspecified: Secondary | ICD-10-CM

## 2011-07-03 DIAGNOSIS — Z6825 Body mass index (BMI) 25.0-25.9, adult: Secondary | ICD-10-CM

## 2011-07-03 DIAGNOSIS — E663 Overweight: Secondary | ICD-10-CM

## 2011-07-03 LAB — POCT GLYCOSYLATED HEMOGLOBIN (HGB A1C): Hemoglobin A1C: 8.1

## 2011-07-03 MED ORDER — FLUTICASONE PROPIONATE 50 MCG/ACT NA SUSP: 2.0000 | Freq: Every day | NASAL | Status: DC

## 2011-07-03 NOTE — Assessment & Plan Note (Signed)
Refractory to cetirizine and cannot tolerate Benadryl during the day. Therefore, we will start fluticasone nasal spray.

## 2011-07-05 DIAGNOSIS — E663 Overweight: Secondary | ICD-10-CM | POA: Insufficient documentation

## 2011-07-05 DIAGNOSIS — Z6824 Body mass index (BMI) 24.0-24.9, adult: Secondary | ICD-10-CM | POA: Insufficient documentation

## 2011-07-05 NOTE — Patient Instructions (Addendum)
Patient is blind and cannot read instructions. He agreed to plan and gave verbal feedback.

## 2011-07-05 NOTE — Assessment & Plan Note (Signed)
Lipid Panel     Component Value Date/Time   CHOL 138 06/26/2011 0825   TRIG 75 06/26/2011 0825   HDL 51 06/26/2011 0825   CHOLHDL 2.7 06/26/2011 0825   VLDL 15 06/26/2011 0825   LDLCALC 72 06/26/2011 0825    Lipids perfectly controlled on simvastatin 40 mg and no side effects, so continue for 1 more year.

## 2011-07-05 NOTE — Progress Notes (Signed)
  Subjective:    Patient ID: Chad Hall, male    DOB: 1950-11-29, 61 y.o.   MRN: [id number]  HPI  1. Runny Nose - Pt believes that it is caused allergies; It is persistent, but worse recently; Resistant to Zyrtec and benadryl not tolerated due to sleepiness if he has to work; also accompanied by itchy throat and dry cough  2.  DIABETES  Taking and tolerating: yes - metformin 1000 mg BID; glyburide 5 mg BID Fasting blood sugars: not checking 2/2 problem with meter Hypoglycemic symptoms: no Visual problems: permanently blind Monitoring feet: yes Numbness/Tingling: no Last eye exam: not applicable Diabetic Labs:  Lab Results  Component Value Date   HGBA1C 8.1 07/03/2011   HGBA1C 8.6 04/03/2011   HGBA1C 7.8 10/30/2010   Lab Results  Component Value Date   LDLCALC 72 06/26/2011   CREATININE 1.07 06/26/2011   3. HYPERTENSION  Home BP monitoring:  BP Readings from Last 3 Encounters:  07/03/11 138/70  04/03/11 140/80  10/30/10 151/76    Prescribed meds: enalapril 20 mg; bisoprolol-HCTZ 10-6.25  Hypertension ROS: taking medications as instructed, no medication side effects noted, no TIA's, no chest pain on exertion, no dyspnea on exertion and no swelling of ankles   Review of Systems See HPI    Objective:   Physical Exam BP 138/70  Pulse 61  Ht 6' 5.5" (1.969 m)  Wt 249 lb (112.946 kg)  BMI 29.15 kg/m2 Gen: elderly AAM, very tall; blind with sunglasses on and accompanied by service dog; non-distressed Cardiac: RRR, no murmurs Lungs: CTA-B Feet: 2+ PT pulses bilaterally; sensation intact to monofilament testing bilaterally; no calluses or ulcers; long curling toenails on right digits 3,4  Lab Results  Component Value Date   HGBA1C 8.1 07/03/2011       Assessment & Plan:  Chad Hall is a 61 year old man with HTN, DM type 2, and allergic rhinits.

## 2011-07-05 NOTE — Assessment & Plan Note (Signed)
Hbg A1C now 8.1 from 8.6 in February, so trending in the right direction. Still above goal of < 8.0 - Stay will metformin 1000mg  BID and glyburide 5 mg BID for now b/c I think he is close to goal and will achieve it with exercise - Exercise 5x week for 30 minutes a day

## 2011-07-05 NOTE — Assessment & Plan Note (Signed)
Well-controlled on bisoprolol-HCTZ 10-6.25 and Enalapril 20 mg; continue with current regimen

## 2011-07-24 ENCOUNTER — Other Ambulatory Visit: Payer: Self-pay | Admitting: Family Medicine

## 2011-09-18 ENCOUNTER — Encounter: Payer: Self-pay | Admitting: Home Health Services

## 2011-09-18 ENCOUNTER — Ambulatory Visit (INDEPENDENT_AMBULATORY_CARE_PROVIDER_SITE_OTHER): Payer: Medicare Other | Admitting: Home Health Services

## 2011-09-18 VITALS — BP 117/67 | HR 61 | Temp 98.3°F

## 2011-09-18 DIAGNOSIS — Z0000 Encounter for general adult medical examination without abnormal findings: Secondary | ICD-10-CM

## 2011-09-18 NOTE — Progress Notes (Signed)
Patient here for annual wellness visit, patient reports: Risk Factors/Conditions needing evaluation or treatment: Pt does not have any new risk factors that need evaluation. Home Safety: Pt lives with wife in 1 story home.  Pt reports having smoke detectors and does not have adaptive equipment in bathroom. Other Information: Corrective lens: Pt is blind. Dentures: Pt does not have dentures and visits dentist every 6 months. Memory: Pt denies any memory problems. Patient's Mini Mental Score (recorded in doc. flowsheet): 28 Pt declined hearing screening  Balance/Gait: Unable to access, pt is dependent in guide dog/walking cane for mobility.  Balance Abnormal Patient value  Sitting balance    Sit to stand    Attempts to arise    Immediate standing balance    Standing balance    Nudge    Eyes closed- Romberg    Tandem stance    Back lean    Neck Rotation    360 degree turn    Sitting down     Gait Abnormal Patient value  Initiation of gait    Step length-left    Step length-right    Step height-left    Step height-right    Step symmetry    Step continuity    Path deviation    Trunk movement    Walking stance        Annual Wellness Visit Requirements Recorded Today In  Medical, family, social history Past Medical, Family, Social History Section  Current providers Care team  Current medications Medications  Wt, BP, Ht, BMI Vital signs  Hearing assessment (welcome visit) Progress Note  Tobacco, alcohol, illicit drug use History  ADL Nurse Assessment  Depression Screening Nurse Assessment  Cognitive impairment Nurse Assessment  Mini Mental Status Document Flowsheet  Fall Risk Nurse Assessment  Home Safety Progress Note  End of Life Planning (welcome visit) Social Documentation  Medicare preventative services Progress Note  Risk factors/conditions needing evaluation/treatment Progress Note  Personalized health advice Patient Instructions, goals, letter  Diet &  Exercise Social Documentation  Emergency Contact Social Documentation  Seat Belts Social Documentation  Sun exposure/protection Social Documentation    Medicare Prevention Plan:   Recommended Medicare Prevention Screenings Men over 65 Test For Frequency Date of Last- BOLD if needed  Colorectal Cancer 1-10 yrs 6/08  Prostate Cancer Never or yearly NI  Aortic Aneurysm Once if 65-75 with hx of smoking NI-due to age  Cholesterol 5 yrs 5/13  Diabetes yearly 5/13  HIV yearly declined  Influenza Shot yearly discussed  Pneumonia Shot once NI-due to age   Zostavax Shot once Discussed

## 2011-09-21 ENCOUNTER — Encounter: Payer: Self-pay | Admitting: Home Health Services

## 2011-09-21 NOTE — Progress Notes (Signed)
Patient ID: Chad Hall, male   DOB: 28-Feb-1950, 62 y.o.   MRN: [id number]  I have reviewed this visit and discussed with Arlys John and agree with her documentation.

## 2011-10-27 ENCOUNTER — Other Ambulatory Visit: Payer: Self-pay | Admitting: *Deleted

## 2011-10-27 DIAGNOSIS — IMO0002 Reserved for concepts with insufficient information to code with codable children: Secondary | ICD-10-CM

## 2011-10-27 DIAGNOSIS — I152 Hypertension secondary to endocrine disorders: Secondary | ICD-10-CM

## 2011-10-27 DIAGNOSIS — E1165 Type 2 diabetes mellitus with hyperglycemia: Secondary | ICD-10-CM

## 2011-10-27 DIAGNOSIS — E1159 Type 2 diabetes mellitus with other circulatory complications: Secondary | ICD-10-CM

## 2011-10-27 DIAGNOSIS — I10 Essential (primary) hypertension: Secondary | ICD-10-CM

## 2011-10-27 MED ORDER — ENALAPRIL MALEATE 20 MG PO TABS: 20.0000 mg | ORAL_TABLET | Freq: Every day | ORAL | Status: DC

## 2011-10-27 MED ORDER — BISOPROLOL-HYDROCHLOROTHIAZIDE 10-6.25 MG PO TABS: 1.0000 | ORAL_TABLET | Freq: Every day | ORAL | Status: DC

## 2011-10-27 MED ORDER — GLYBURIDE 5 MG PO TABS: 10.0000 mg | ORAL_TABLET | Freq: Every day | ORAL | Status: DC

## 2012-01-12 ENCOUNTER — Ambulatory Visit: Payer: Medicare Other

## 2012-01-13 ENCOUNTER — Ambulatory Visit (INDEPENDENT_AMBULATORY_CARE_PROVIDER_SITE_OTHER): Payer: Medicare Other | Admitting: *Deleted

## 2012-01-13 DIAGNOSIS — Z23 Encounter for immunization: Secondary | ICD-10-CM

## 2012-01-21 ENCOUNTER — Other Ambulatory Visit: Payer: Self-pay | Admitting: *Deleted

## 2012-01-21 MED ORDER — METFORMIN HCL 1000 MG PO TABS: 1000.0000 mg | ORAL_TABLET | Freq: Two times a day (BID) | ORAL | Status: DC

## 2012-01-21 NOTE — Telephone Encounter (Signed)
Patient needs to be seen in clinic before any refills to be given.

## 2012-02-15 ENCOUNTER — Ambulatory Visit (INDEPENDENT_AMBULATORY_CARE_PROVIDER_SITE_OTHER): Payer: Medicare Other | Admitting: Family Medicine

## 2012-02-15 ENCOUNTER — Encounter: Payer: Self-pay | Admitting: Family Medicine

## 2012-02-15 VITALS — BP 170/83 | HR 65 | Temp 98.3°F | Ht 77.5 in | Wt 252.0 lb

## 2012-02-15 DIAGNOSIS — L2089 Other atopic dermatitis: Secondary | ICD-10-CM

## 2012-02-15 DIAGNOSIS — I10 Essential (primary) hypertension: Secondary | ICD-10-CM

## 2012-02-15 DIAGNOSIS — E119 Type 2 diabetes mellitus without complications: Secondary | ICD-10-CM

## 2012-02-15 DIAGNOSIS — L209 Atopic dermatitis, unspecified: Secondary | ICD-10-CM

## 2012-02-15 LAB — POCT GLYCOSYLATED HEMOGLOBIN (HGB A1C): Hemoglobin A1C: 6.7

## 2012-02-15 NOTE — Progress Notes (Signed)
  Subjective:    Patient ID: Chad Hall, male    DOB: [birth year], 61 y.o.   MRN: [id number]  HPI   1. Diabetes  Taking and tolerating: yes - metformin 1000mg  BID, glyburide 10 mg daily Fasting blood sugars: 107  Hypoglycemic symptoms: no Diet Changes: none Exercise: walking with work Visual problems: blind Last eye exam:n/a Monitoring feet: yes, last exam May 2013 Numbness/Tingling: no Diabetic Labs:  Lab Results  Component Value Date   HGBA1C 8.1 07/03/2011   HGBA1C 8.6 04/03/2011   HGBA1C 7.8 10/30/2010   Lab Results  Component Value Date   LDLCALC 72 06/26/2011   CREATININE 1.07 06/26/2011   Last microalbumin: No results found for this basename: MICROALBUR, [id number]   Taking an ACE-I ?: yes enalapril   2. Hypertension  Home BP monitoring:  BP Readings from Last 3 Encounters:  02/15/12 170/83  09/18/11 117/67  07/03/11 138/70    Prescribed meds: enalapril 20 mg daily, bisoprolol-HCTZ 10-6.25  Hypertension ROS: taking medications as instructed, no medication side effects noted, no TIA's, no chest pain on exertion, no dyspnea on exertion and no swelling of ankles  3. Rash - Located on abdomen and back, started 1 month ago, but it has been recurrent over several years, he was previously told that it was eczema and treated with triamcinolone, it is itching and has raised bumps, it is not tender,warm or associated with bleeding  Review of Systems Negative unless stated in HPI    Objective:   Physical Exam BP 170/83  Pulse 65  Temp 98.3 F (36.8 C) (Oral)  Ht 6' 5.5" (1.969 m)  Wt 252 lb (114.306 kg)  BMI 29.50 kg/m2 Gen: elderly AAM, accompanied by guide dog, NAD, pleasant and conversant CV: RRR, no m,r,g; no JVD Skin: hyperpigmentation with small non-tender nodules with excoriations across most of abdomen and upper back in distribution of suspender, no drainage, evidence of healing      Assessment & Plan:

## 2012-02-16 DIAGNOSIS — L209 Atopic dermatitis, unspecified: Secondary | ICD-10-CM | POA: Insufficient documentation

## 2012-02-16 MED ORDER — TRIAMCINOLONE ACETONIDE 0.1 % EX CREA: TOPICAL_CREAM | Freq: Two times a day (BID) | CUTANEOUS | Status: DC

## 2012-02-16 MED ORDER — METFORMIN HCL 1000 MG PO TABS: 1000.0000 mg | ORAL_TABLET | Freq: Two times a day (BID) | ORAL | Status: DC

## 2012-02-16 NOTE — Assessment & Plan Note (Signed)
Elevated today likely 2/2 stress. Cont meds since well controlled in past on this regimen and re-check at one month visit.

## 2012-02-16 NOTE — Assessment & Plan Note (Signed)
Well controlled. Continue Metformin 1000 mg BID and glyburide 10 mg daily.

## 2012-02-16 NOTE — Assessment & Plan Note (Signed)
Increase strength of triamcinolone to 0.5%. Recheck in one month and if no improvement then perform biopsy.

## 2012-03-18 ENCOUNTER — Encounter: Payer: Self-pay | Admitting: Family Medicine

## 2012-03-18 ENCOUNTER — Ambulatory Visit (INDEPENDENT_AMBULATORY_CARE_PROVIDER_SITE_OTHER): Payer: Medicare Other | Admitting: Family Medicine

## 2012-03-18 VITALS — BP 158/76 | HR 65 | Ht 77.5 in | Wt 252.0 lb

## 2012-03-18 DIAGNOSIS — L2089 Other atopic dermatitis: Secondary | ICD-10-CM

## 2012-03-18 DIAGNOSIS — L209 Atopic dermatitis, unspecified: Secondary | ICD-10-CM

## 2012-03-18 DIAGNOSIS — J309 Allergic rhinitis, unspecified: Secondary | ICD-10-CM

## 2012-03-18 MED ORDER — FLUTICASONE PROPIONATE 50 MCG/ACT NA SUSP: 2.0000 | Freq: Every day | NASAL | Status: DC

## 2012-03-18 NOTE — Assessment & Plan Note (Signed)
Causing persistent cough. Will restart fluticasone and also use benadryl QHS.

## 2012-03-18 NOTE — Patient Instructions (Signed)
Given oral instruction as patient blind.

## 2012-03-18 NOTE — Assessment & Plan Note (Signed)
Clearly improved with triamcinolone and moisturizer. However, he has a tendency to be a skin picker and scratcher. To decrease the amount of itching, we will use benadryl 25 mg QHS and  Go up to 50 mg as needed.

## 2012-03-18 NOTE — Progress Notes (Signed)
  Subjective:    Patient ID: BENI TURRELL, male    DOB: 24-Oct-1950, 62 y.o.   MRN: [id number]  HPI  62 year old M who present for evaluation of dermatitis.    Dermatitis - He was evaluated on 02/15/13 for this and it was determined to be atopic dermatitis with numerous excoriations and healing lesions. He was started on Triamcinolone 0.5% daily for  1 month. He has been using this as well as daily moisturizer for dry skin. He notes that the itching is still present at night, which is when he scratches the most. He also admits to picking at the smaller nodules. Overall, he thinks that his course has improved.   Cough - Present for several weeks, accompanied by nasal drainage, not accompanied by fever or dyspnea, worse at night, exacerbated by heat; has a history of allergic rhinitis that was refractory to zyrtec and claritin; has rx for fluticasone but does not use it   Review of Systems See HPI     Objective:   Physical Exam BP 158/76  Pulse 65  Ht 6' 5.5" (1.969 m)  Wt 252 lb (114.306 kg)  BMI 29.50 kg/m2 Gen: elderly AAM, well appearing HEENT: blind, boggy basal turbinates with clear drainage, OP clear and moist, no tonsillar erythema or drainage Lung: CTA-B Skin: numerous healing 2-3 cm nodule skin lesion and evidence of excoriation throughout abdomen and trunk; back with numerous well healing excoriations; skin moist without erythema     Assessment & Plan:  62 year old M with atopy in the form of atopic dermatitis that is improving and allergic rhinitis causing cough.

## 2012-04-05 ENCOUNTER — Other Ambulatory Visit: Payer: Self-pay | Admitting: *Deleted

## 2012-04-05 DIAGNOSIS — L209 Atopic dermatitis, unspecified: Secondary | ICD-10-CM

## 2012-04-05 MED ORDER — TRIAMCINOLONE ACETONIDE 0.1 % EX CREA: TOPICAL_CREAM | Freq: Two times a day (BID) | CUTANEOUS | Status: DC

## 2012-04-19 ENCOUNTER — Other Ambulatory Visit: Payer: Self-pay | Admitting: *Deleted

## 2012-04-19 DIAGNOSIS — E1165 Type 2 diabetes mellitus with hyperglycemia: Secondary | ICD-10-CM

## 2012-04-19 DIAGNOSIS — I10 Essential (primary) hypertension: Secondary | ICD-10-CM

## 2012-04-20 MED ORDER — GLYBURIDE 5 MG PO TABS: 10.0000 mg | ORAL_TABLET | Freq: Every day | ORAL | Status: DC

## 2012-04-20 MED ORDER — BISOPROLOL-HYDROCHLOROTHIAZIDE 10-6.25 MG PO TABS: 1.0000 | ORAL_TABLET | Freq: Every day | ORAL | Status: DC

## 2012-04-20 MED ORDER — ENALAPRIL MALEATE 20 MG PO TABS: 20.0000 mg | ORAL_TABLET | Freq: Every day | ORAL | Status: DC

## 2012-06-30 ENCOUNTER — Encounter: Payer: Self-pay | Admitting: Family Medicine

## 2012-06-30 ENCOUNTER — Ambulatory Visit (INDEPENDENT_AMBULATORY_CARE_PROVIDER_SITE_OTHER): Payer: Medicare Other | Admitting: Family Medicine

## 2012-06-30 VITALS — BP 179/80 | HR 58 | Temp 98.6°F | Wt 252.0 lb

## 2012-06-30 DIAGNOSIS — I10 Essential (primary) hypertension: Secondary | ICD-10-CM

## 2012-06-30 DIAGNOSIS — E119 Type 2 diabetes mellitus without complications: Secondary | ICD-10-CM

## 2012-06-30 DIAGNOSIS — I872 Venous insufficiency (chronic) (peripheral): Secondary | ICD-10-CM

## 2012-06-30 MED ORDER — HYDROCHLOROTHIAZIDE 25 MG PO TABS: 25.0000 mg | ORAL_TABLET | Freq: Every day | ORAL | Status: DC

## 2012-06-30 MED ORDER — METOPROLOL SUCCINATE ER 25 MG PO TB24: 25.0000 mg | ORAL_TABLET | Freq: Every day | ORAL | Status: DC

## 2012-06-30 NOTE — Progress Notes (Signed)
  Subjective:    Patient ID: Chad Hall, male    DOB: Jul 14, 1950, 62 y.o.   MRN: [id number]  HPI  62 year old M with history of blindness, allergic rhinitis, and DM type 2 who presents for follow up.   Hypertension  Home BP monitoring: 180/81, 141/72 - checking in the same arm each time (right)  Office BP: BP Readings from Last 3 Encounters:  06/30/12 179/80  03/18/12 158/76  02/15/12 170/83    Prescribed meds: enalapril 20 mg daily, bisoprolol-HCTZ 10-6.25  Hypertension ROS:  Taking medications as prescribed:Yes Chest pain: Yes Shortness of breath: Yes Swelling of extremities: Yes - see details below TIA symptoms: No   2. Swelling of left lower extremity: Onset - several years ago, comes and goes Frequency - Occurs on days when he is seated at work  Improves at night and has resolved in the morning No history of heart failure, shortness of breath, renal failure    Review of Systems     Objective:   Physical Exam  BP 179/80  Pulse 58  Temp(Src) 98.6 F (37 C) (Oral)  Wt 252 lb (114.306 kg)  BMI 29.48 kg/m2 Gen: elderly AAM, obese, non ill appearing,  CV: 2/6 systolic murmur, no JVD, no HJR Lungs: CTA-B, normal work of breathing Extremities: 1+ pitting edema of ankles bilaterally     Assessment & Plan:

## 2012-06-30 NOTE — Patient Instructions (Addendum)
Patient instructed verbally b/c he is blind.

## 2012-07-06 ENCOUNTER — Other Ambulatory Visit: Payer: Self-pay | Admitting: *Deleted

## 2012-07-06 DIAGNOSIS — L209 Atopic dermatitis, unspecified: Secondary | ICD-10-CM

## 2012-07-06 MED ORDER — TRIAMCINOLONE ACETONIDE 0.1 % EX CREA: TOPICAL_CREAM | Freq: Two times a day (BID) | CUTANEOUS | Status: DC

## 2012-07-08 ENCOUNTER — Telehealth: Payer: Self-pay | Admitting: Family Medicine

## 2012-07-08 DIAGNOSIS — E1159 Type 2 diabetes mellitus with other circulatory complications: Secondary | ICD-10-CM

## 2012-07-08 DIAGNOSIS — IMO0002 Reserved for concepts with insufficient information to code with codable children: Secondary | ICD-10-CM

## 2012-07-08 DIAGNOSIS — E1165 Type 2 diabetes mellitus with hyperglycemia: Secondary | ICD-10-CM

## 2012-07-08 DIAGNOSIS — E119 Type 2 diabetes mellitus without complications: Secondary | ICD-10-CM

## 2012-07-08 DIAGNOSIS — I152 Hypertension secondary to endocrine disorders: Secondary | ICD-10-CM

## 2012-07-08 DIAGNOSIS — J309 Allergic rhinitis, unspecified: Secondary | ICD-10-CM

## 2012-07-08 NOTE — Telephone Encounter (Signed)
Called to say that his BP readings are good but his pulse has increased to 90-100 BPM not sure if this is caused by medication  pls call all of his meds called to [facility] so that they can get his meds to him before he runs out -

## 2012-07-08 NOTE — Telephone Encounter (Signed)
Will fwd to MD.  [name] with patient who said he spoke with MD about this 06/30/12.  [name], Darlyne Russian, CMA

## 2012-07-09 MED ORDER — METFORMIN HCL 1000 MG PO TABS: 1000.0000 mg | ORAL_TABLET | Freq: Two times a day (BID) | ORAL | Status: DC

## 2012-07-09 MED ORDER — SIMVASTATIN 40 MG PO TABS: 40.0000 mg | ORAL_TABLET | Freq: Every day | ORAL | Status: DC

## 2012-07-09 MED ORDER — GLYBURIDE 5 MG PO TABS: 10.0000 mg | ORAL_TABLET | Freq: Every day | ORAL | Status: DC

## 2012-07-09 MED ORDER — ENALAPRIL MALEATE 20 MG PO TABS: 20.0000 mg | ORAL_TABLET | Freq: Every day | ORAL | Status: DC

## 2012-07-09 MED ORDER — FLUTICASONE PROPIONATE 50 MCG/ACT NA SUSP: 2.0000 | Freq: Every day | NASAL | Status: DC

## 2012-07-09 NOTE — Assessment & Plan Note (Addendum)
Stop bisoprolol and start metoprolol XL at 25 mg daily. Also start HCTZ 25 mg. Continue enalapril at 20 mg daily. Follow up in 1 month.

## 2012-07-09 NOTE — Assessment & Plan Note (Signed)
Given Rx for TED compression hose with goal of 15 mmHg compression.

## 2012-07-09 NOTE — Telephone Encounter (Addendum)
Please tell the patient that I sent prescriptions for all of his medications except for hydrochlorothiazide and metoprolol (the 2 new blood pressure medications) and the eczema cream to the new pharmacy. We will send the prescriptions for the new blood pressure medications when I know if they are effective. Please tell the patient that is it his responsibility to tell the [facility] that he is no longer going fill certain medications there, because they may continue to deliver and charge him for those medications if he does not do this. Thank you.

## 2012-07-12 NOTE — Telephone Encounter (Signed)
LMOVM for patient to return call.  See MD note below.  Jozey Janco L, CMA  

## 2012-08-08 ENCOUNTER — Encounter: Payer: Self-pay | Admitting: Family Medicine

## 2012-08-08 ENCOUNTER — Ambulatory Visit (INDEPENDENT_AMBULATORY_CARE_PROVIDER_SITE_OTHER): Payer: Medicare Other | Admitting: Family Medicine

## 2012-08-08 VITALS — BP 151/80 | HR 75 | Ht 77.5 in | Wt 249.0 lb

## 2012-08-08 DIAGNOSIS — Z0000 Encounter for general adult medical examination without abnormal findings: Secondary | ICD-10-CM | POA: Insufficient documentation

## 2012-08-08 DIAGNOSIS — Q8740 Marfan's syndrome, unspecified: Secondary | ICD-10-CM

## 2012-08-08 DIAGNOSIS — I10 Essential (primary) hypertension: Secondary | ICD-10-CM

## 2012-08-08 DIAGNOSIS — Z23 Encounter for immunization: Secondary | ICD-10-CM

## 2012-08-08 DIAGNOSIS — E119 Type 2 diabetes mellitus without complications: Secondary | ICD-10-CM

## 2012-08-08 DIAGNOSIS — L2089 Other atopic dermatitis: Secondary | ICD-10-CM

## 2012-08-08 DIAGNOSIS — L209 Atopic dermatitis, unspecified: Secondary | ICD-10-CM

## 2012-08-08 LAB — CBC
MCHC: 33.1 g/dL (ref 30.0–36.0)
Platelets: 300 10*3/uL (ref 150–400)
RDW: 12.4 % (ref 11.5–15.5)

## 2012-08-08 LAB — BASIC METABOLIC PANEL
CO2: 29 mEq/L (ref 19–32)
Calcium: 10.3 mg/dL (ref 8.4–10.5)
Creat: 1.23 mg/dL (ref 0.50–1.35)
Glucose, Bld: 149 mg/dL — ABNORMAL HIGH (ref 70–99)

## 2012-08-08 LAB — LIPID PANEL
Cholesterol: 163 mg/dL (ref 0–200)
Triglycerides: 106 mg/dL (ref <150)

## 2012-08-08 NOTE — Assessment & Plan Note (Signed)
BP with improved control on HCTZ 25, metoprolol XL 25 and enalapril. If electrolytes stable, then send prescriptions for HCTZ and metoprolol to mail service pharmacy.

## 2012-08-08 NOTE — Progress Notes (Signed)
Subjective:    Patient ID: Chad Hall, male    DOB: October 15, 1950, 62 y.o.   MRN: [id number]  HPI   62 year old M with Type 2 DM, HTN, and Obesity presenting for annual evaluation. No current complaints.   Health Maintenance: Not up to date on pneumonia vaccine, tetanus, zostavax, lipid panel, foot exam, or prostate exam  Past Medical History  Diagnosis Date  . Allergy   . Hyperlipidemia   . Hypertension   . Blindness - both eyes 1967    Basketball injury  . Marfan syndrome   . Diabetes mellitus   . Hernia    Family History  Problem Relation Age of Onset  . Heart disease Mother   . Breast cancer Sister    History   Social History Narrative   Lives in [place] with wife, IRL BODIE   Blind.  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   Exercise: Pt has no regular exercise routine.   Seatbelts: Pt reports wearing seatbelt when in vehicles.    Wynelle Link Exposure/Protection:    Hobbies: Proofreader, walking, sports           Current Outpatient Prescriptions on File Prior to Visit  Medication Sig Dispense Refill  . Docusate Calcium (STOOL SOFTENER PO) Take by mouth daily.        . enalapril (VASOTEC) 20 MG tablet Take 1 tablet (20 mg total) by mouth daily.  90 tablet  3  . fluticasone (FLONASE) 50 MCG/ACT nasal spray Place 2 sprays into the nose daily.  16 g  6  . GAVILYTE-N WITH FLAVOR PACK 420 G solution       . glyBURIDE (DIABETA) 5 MG tablet Take 2 tablets (10 mg total) by mouth daily with breakfast.  180 tablet  3  . hydrochlorothiazide (HYDRODIURIL) 25 MG tablet Take 1 tablet (25 mg total) by mouth daily.  30 tablet  1  . metFORMIN (GLUCOPHAGE) 1000 MG tablet Take 1  tablet (1,000 mg total) by mouth 2 (two) times daily with a meal.  180 tablet  3  . metoprolol succinate (TOPROL-XL) 25 MG 24 hr tablet Take 1 tablet (25 mg total) by mouth daily.  30 tablet  1  . simvastatin (ZOCOR) 40 MG tablet Take 1 tablet (40 mg total) by mouth at bedtime.  90 tablet  3  . triamcinolone cream (KENALOG) 0.1 % Apply topically 2 (two) times daily. Please deliver medication to patient's home. Thank you.  2268 g  1   No current facility-administered medications on file prior to visit.      Review of Systems  All other systems reviewed and are negative.     BP 151/80  Pulse 75  Ht 6' 5.5" (1.969 m)  Wt 249 lb (112.946 kg)  BMI 29.13 kg/m2     Objective:   Physical Exam  Vitals reviewed. Constitutional: He is oriented to person, place, and time. He appears well-developed and well-nourished. No distress.  HENT:  Head: Normocephalic and atraumatic.  Eyes:  Bilateral blindness  Neck: Normal range of motion. Neck supple.  Cardiovascular: Normal rate, regular rhythm,  normal heart sounds and intact distal pulses.  Exam reveals no gallop.   No murmur heard. Pulmonary/Chest: Effort normal and breath sounds normal. No respiratory distress.  Abdominal: Soft. Bowel sounds are normal.  Genitourinary: Rectum normal and prostate normal.  Musculoskeletal: Normal range of motion. He exhibits no edema and no tenderness.  Neurological: He is alert and oriented to person, place, and time. He has normal reflexes.  Skin: He is not diaphoretic.  Numerous well healed excoriations with healing throughout forearms, abdomen, back, and buttocks.   Psychiatric: He has a normal mood and affect. His behavior is normal. Thought content normal.          Assessment & Plan:

## 2012-08-08 NOTE — Assessment & Plan Note (Signed)
Unclear as to how much work up was undertaken for this. Address at next visit and obtain screening echo for baseline look at aorta.

## 2012-08-08 NOTE — Assessment & Plan Note (Signed)
The patient was seen by a dermatologist for this issue and prescribed desonide. His skin does not appear eczematous in most places, but might actually have neurodermatitis. Defer to dermatology at this point.

## 2012-08-08 NOTE — Assessment & Plan Note (Addendum)
Patient need zostavax, but given blindness cannot go to pharmacy to receive this. I will contact pharmacy to see if it can be delivered.  - Check CBC, BMET, and Lipids

## 2012-08-12 ENCOUNTER — Telehealth: Payer: Self-pay | Admitting: Family Medicine

## 2012-08-12 DIAGNOSIS — I10 Essential (primary) hypertension: Secondary | ICD-10-CM

## 2012-08-12 MED ORDER — METOPROLOL SUCCINATE ER 25 MG PO TB24: 25.0000 mg | ORAL_TABLET | Freq: Every day | ORAL | Status: DC

## 2012-08-12 MED ORDER — HYDROCHLOROTHIAZIDE 25 MG PO TABS: 25.0000 mg | ORAL_TABLET | Freq: Every day | ORAL | Status: DC

## 2012-08-12 NOTE — Telephone Encounter (Signed)
Pt returned call. Please call him back. Lorenda Hatchet, Renato Battles

## 2012-08-12 NOTE — Telephone Encounter (Signed)
Discussed at length patient's lab results. Will not change cholesterol medication at this time given potential side effects, cost, and marginal benefit in LDL reduction. Also, will continue to consider echo for Marfan's since the patient is asymptomatic and already taking a beta blocker. HCTZ and metoprolol refilled.

## 2012-08-12 NOTE — Telephone Encounter (Signed)
Called patient on home phone number and left message regarding elevated CV risk of 32% based on risk factors. Therefore, we need to switch to a high strength statin, Lipitor. Also, I informed him that we need to have a screening echocardiogram to look at his heart given the history of Marfan's syndrome. Patient instructed to return my call about these issues.

## 2012-08-25 ENCOUNTER — Other Ambulatory Visit: Payer: Self-pay

## 2012-09-05 ENCOUNTER — Other Ambulatory Visit: Payer: Self-pay | Admitting: *Deleted

## 2012-09-05 DIAGNOSIS — I10 Essential (primary) hypertension: Secondary | ICD-10-CM

## 2012-09-05 MED ORDER — METOPROLOL SUCCINATE ER 25 MG PO TB24: 25.0000 mg | ORAL_TABLET | Freq: Every day | ORAL | Status: DC

## 2012-09-09 ENCOUNTER — Encounter: Payer: Self-pay | Admitting: Family Medicine

## 2012-11-01 ENCOUNTER — Encounter: Payer: Self-pay | Admitting: Home Health Services

## 2012-11-09 ENCOUNTER — Encounter: Payer: Self-pay | Admitting: Family Medicine

## 2012-11-09 ENCOUNTER — Ambulatory Visit (INDEPENDENT_AMBULATORY_CARE_PROVIDER_SITE_OTHER): Payer: Medicare Other | Admitting: Family Medicine

## 2012-11-09 VITALS — BP 148/73 | HR 65 | Ht 77.0 in | Wt 251.0 lb

## 2012-11-09 DIAGNOSIS — E1149 Type 2 diabetes mellitus with other diabetic neurological complication: Secondary | ICD-10-CM

## 2012-11-09 DIAGNOSIS — E1142 Type 2 diabetes mellitus with diabetic polyneuropathy: Secondary | ICD-10-CM

## 2012-11-09 DIAGNOSIS — Z23 Encounter for immunization: Secondary | ICD-10-CM

## 2012-11-09 DIAGNOSIS — L603 Nail dystrophy: Secondary | ICD-10-CM

## 2012-11-09 DIAGNOSIS — L608 Other nail disorders: Secondary | ICD-10-CM

## 2012-11-09 DIAGNOSIS — E119 Type 2 diabetes mellitus without complications: Secondary | ICD-10-CM

## 2012-11-09 NOTE — Progress Notes (Signed)
Patient ID: Chad Hall, male   DOB: 1950-08-15, 62 y.o.   MRN: [id number]   Subjective:   62 year old M with DM type 2, HTN, and obesity who presents for follow up.   Health Maintenance - Needs flu vaccine and foot exam today.  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 Renae Fickle Scheeler  Foot ulcers: No    Review of Systems:  Respiratory: negative Cardiovascular: negative Musculoskeletal:negative     Objective:   Physical Exam: BP 148/73  Pulse 65  Ht 6\' 5"  (1.956 m)  Wt 251 lb (113.853 kg)  BMI 29.76 kg/m2  General: alert, well appearing, and in no distress and obese, elderly AAM Eyes: blind Cardiovascular: RRR, nl S1 and S2, no murmur Feet: warm, good capillary refill; positive Monofilament testing for neuropathy bilaterally; Left foot - callouses at distal head of all metatarsal; Right foot callouses at distal head of 1st and 4th metarsal; no ulcers Toenails: dystrophic nails on both feet bilaterally marked thickening and burrowing into the distal toe pads concern for pre-ulcerative lesions  Labs:   Diabetic Labs:  Lab Results  Component Value Date   HGBA1C 7.1 06/30/2012   HGBA1C 6.7 02/15/2012   HGBA1C 8.1 07/03/2011   Lab Results  Component Value Date   LDLCALC 86 08/08/2012   CREATININE 1.23 08/08/2012   Last microalbumin: No results found for this basename: MICROALBUR, [id number]        Assessment & Plan:  Influenza vaccine administered today

## 2012-11-10 DIAGNOSIS — E1142 Type 2 diabetes mellitus with diabetic polyneuropathy: Secondary | ICD-10-CM | POA: Insufficient documentation

## 2012-11-10 DIAGNOSIS — L603 Nail dystrophy: Secondary | ICD-10-CM | POA: Insufficient documentation

## 2012-11-10 NOTE — Assessment & Plan Note (Signed)
Assessment: patient with worsening control hemoglobin A1c from 7.1-8.8 within a four-month period, which patient feels reflective of his lifestyle change and worsening diet and lack of exercise; patient recognizes me knows the proper way to use but has not been doing it and also not exercising because he did not feel safe outside exercising because he is blind and feels that he might be subject to violence or robbery; patient adamantly does not want to change medication regimen for diabetes, which unwilling to agree with for temporary period Plan: - Continue metformin and glyburide at same doses - Patient confident that he can improve exercise and diet enough to decrease A1c to less than 8 - Followup in 3 months

## 2012-11-10 NOTE — Assessment & Plan Note (Signed)
Assessment: patient dystrophic nails on feet bilaterally that are starting to place pressure on the skin of the distal toes and adjacent toes Plan: all 10 toenails trimmed today in clinic

## 2012-11-10 NOTE — Assessment & Plan Note (Signed)
Assessment: patient failed monofilament testing bilaterally indicating diabetic peripheral neuropathy and patient has calluses on the plantar surfaces of both feet, but no ulcers Plan:  - We will need to improve glycemic control as documented in diabetes problem - Patient encouraged to wear loosefitting supportive shoes with soft insoles daily - Toenails also trimmed because these were causing potential ulcers from burrowing into the skin

## 2012-11-24 ENCOUNTER — Other Ambulatory Visit: Payer: Self-pay | Admitting: Family Medicine

## 2012-11-24 DIAGNOSIS — J309 Allergic rhinitis, unspecified: Secondary | ICD-10-CM

## 2013-03-01 ENCOUNTER — Other Ambulatory Visit: Payer: Self-pay | Admitting: Family Medicine

## 2013-04-10 ENCOUNTER — Encounter: Payer: Self-pay | Admitting: Family Medicine

## 2013-04-10 ENCOUNTER — Ambulatory Visit (INDEPENDENT_AMBULATORY_CARE_PROVIDER_SITE_OTHER): Payer: Medicare Other | Admitting: Family Medicine

## 2013-04-10 VITALS — BP 140/80 | HR 65 | Ht 77.0 in | Wt 246.0 lb

## 2013-04-10 DIAGNOSIS — E1149 Type 2 diabetes mellitus with other diabetic neurological complication: Secondary | ICD-10-CM

## 2013-04-10 DIAGNOSIS — M25552 Pain in left hip: Secondary | ICD-10-CM | POA: Insufficient documentation

## 2013-04-10 DIAGNOSIS — L2089 Other atopic dermatitis: Secondary | ICD-10-CM

## 2013-04-10 DIAGNOSIS — E1142 Type 2 diabetes mellitus with diabetic polyneuropathy: Secondary | ICD-10-CM

## 2013-04-10 DIAGNOSIS — S7002XA Contusion of left hip, initial encounter: Secondary | ICD-10-CM

## 2013-04-10 DIAGNOSIS — S7000XA Contusion of unspecified hip, initial encounter: Secondary | ICD-10-CM

## 2013-04-10 DIAGNOSIS — M25559 Pain in unspecified hip: Secondary | ICD-10-CM

## 2013-04-10 DIAGNOSIS — L309 Dermatitis, unspecified: Secondary | ICD-10-CM

## 2013-04-10 DIAGNOSIS — L209 Atopic dermatitis, unspecified: Secondary | ICD-10-CM

## 2013-04-10 DIAGNOSIS — L259 Unspecified contact dermatitis, unspecified cause: Secondary | ICD-10-CM

## 2013-04-10 HISTORY — DX: Pain in left hip: M25.552

## 2013-04-10 LAB — POCT GLYCOSYLATED HEMOGLOBIN (HGB A1C): HEMOGLOBIN A1C: 8.5

## 2013-04-10 MED ORDER — BETAMETHASONE DIPROPIONATE AUG 0.05 % EX CREA: TOPICAL_CREAM | Freq: Two times a day (BID) | CUTANEOUS | Status: DC

## 2013-04-10 MED ORDER — DICLOFENAC SODIUM 75 MG PO TBEC: 75.0000 mg | DELAYED_RELEASE_TABLET | Freq: Two times a day (BID) | ORAL | Status: DC

## 2013-04-10 MED ORDER — TRIAMCINOLONE ACETONIDE 0.1 % EX CREA: 1.0000 "application " | TOPICAL_CREAM | Freq: Two times a day (BID) | CUTANEOUS | Status: DC

## 2013-04-10 NOTE — Assessment & Plan Note (Signed)
A: new onset left hip pain likely a contusion and strained adductor muscles due to mechanisms of injury; low suspicion for fractures since normal ROM, normal leg length, and normal gait P: diclofenac 75 mg PO BID and consider imaging if pain persists

## 2013-04-10 NOTE — Assessment & Plan Note (Signed)
A: pt with stable appearing mild eczema and continues to have itching and excortication that appears to be out of proportion to his eczema and he notes that benadryl did not work for his itching in the past; I am still worried about neurodermatitis but also IgE mediated inflammation causing itching since he also has allergic rhinitis P:  - Counseled on lubridem BID - Counseled to avoid daily steroids and avoid higher potency - Given rx for triamcinolone to use intermittently for flares, but also with betamethasone for severe refractory flares - If persistent itching, I will consider measure serum IgE and starting a leukotriene inhibitor in the future

## 2013-04-10 NOTE — Progress Notes (Signed)
Patient ID: Chad Hall, male   DOB: Aug 08, 1950, 63 y.o.   MRN: [id number]   Subjective:   63 year old M with DM type 2, diabetic peripheral neuropathy, obesity, and Marfan's syndrome who presents for follow up.   Fall   Pt slipped on ice near his home; Pain on left hip after fall on Friday; pain is a  7/10 and radiates to medial left thigh; pt denies hitting his head or LOC; Pt was not evaluated prior to now; he tried tylenol with some relief but would like something stronger; he denies any numbness or tingling of if perineal area and no bowel or bladder problems;   Eczema  Pt with generalized itching and eczema; chronic problem;  He has been using fluocinonide 0.05% and desonide 0.5% in addition to lubriderm lotion once daily; he notes that his skin itches the most on his back and extremities; pt has seen a dermatologist for this issue one time, but does not plan to return due to cost     Current Outpatient Prescriptions on File Prior to Visit  Medication Sig Dispense Refill  . enalapril (VASOTEC) 20 MG tablet Take 1 tablet (20 mg total) by mouth daily.  90 tablet  3  . fluticasone (FLONASE) 50 MCG/ACT nasal spray Use 2 sprays nasally daily  48 g  3  . glyBURIDE (DIABETA) 5 MG tablet Take 2 tablets (10 mg total) by mouth daily with breakfast.  180 tablet  3  . hydrochlorothiazide (HYDRODIURIL) 25 MG tablet Take 1 tablet (25 mg total) by mouth daily.  90 tablet  3  . hydrOXYzine (ATARAX/VISTARIL) 25 MG tablet       . metFORMIN (GLUCOPHAGE) 1000 MG tablet Take 1 tablet (1,000 mg total) by mouth 2 (two) times daily with a meal.  180 tablet  3  . metoprolol succinate (TOPROL-XL) 25 MG 24 hr tablet Take 1 tablet (25 mg total) by mouth daily.  90 tablet  3  . simvastatin (ZOCOR) 40 MG tablet Take 1 tablet (40 mg total) by mouth at bedtime.  90 tablet  3   No current facility-administered medications on file prior to visit.     Review of Systems:  Constitutional: negative for chills,  fatigue and fevers Respiratory: negative for cough and shortness of breath Cardiovascular: negative for chest pain Psych: positive for stress due to family member passing     Objective:   Physical Exam: BP 140/80  Pulse 65  Ht 6\' 5"  (1.956 m)  Wt 246 lb (111.585 kg)  BMI 29.17 kg/m2  Gen: elderly AAM, very tall, non ill appearing, pleasant, accompanied by his guide dog MSK: left hip with tenderness to palpation over greater trochanter without swelling; normal internal and external rotation of hip; leg length symmetric Neuro: 5/5 strength of lower extremities bilaterally Skin: mild xerosis of LE bilaterally, one eczematous patch on LUE; excoriations on trunk and abdomen      Assessment & Plan:

## 2013-04-13 ENCOUNTER — Other Ambulatory Visit: Payer: Self-pay | Admitting: Family Medicine

## 2013-06-05 ENCOUNTER — Other Ambulatory Visit: Payer: Self-pay | Admitting: Family Medicine

## 2013-07-12 ENCOUNTER — Ambulatory Visit (INDEPENDENT_AMBULATORY_CARE_PROVIDER_SITE_OTHER): Payer: Medicare Other | Admitting: Family Medicine

## 2013-07-12 ENCOUNTER — Encounter: Payer: Self-pay | Admitting: Family Medicine

## 2013-07-12 VITALS — BP 142/90 | HR 85 | Temp 98.7°F | Ht 77.0 in | Wt 246.0 lb

## 2013-07-12 DIAGNOSIS — L508 Other urticaria: Secondary | ICD-10-CM

## 2013-07-12 DIAGNOSIS — L2089 Other atopic dermatitis: Secondary | ICD-10-CM

## 2013-07-12 DIAGNOSIS — E1149 Type 2 diabetes mellitus with other diabetic neurological complication: Secondary | ICD-10-CM

## 2013-07-12 DIAGNOSIS — L209 Atopic dermatitis, unspecified: Secondary | ICD-10-CM

## 2013-07-12 LAB — COMPREHENSIVE METABOLIC PANEL
ALBUMIN: 3.9 g/dL (ref 3.5–5.2)
ALK PHOS: 62 U/L (ref 39–117)
ALT: 16 U/L (ref 0–53)
AST: 14 U/L (ref 0–37)
BUN: 36 mg/dL — AB (ref 6–23)
CALCIUM: 10 mg/dL (ref 8.4–10.5)
CHLORIDE: 102 meq/L (ref 96–112)
CO2: 27 mEq/L (ref 19–32)
Creat: 1.61 mg/dL — ABNORMAL HIGH (ref 0.50–1.35)
Glucose, Bld: 121 mg/dL — ABNORMAL HIGH (ref 70–99)
POTASSIUM: 4.6 meq/L (ref 3.5–5.3)
SODIUM: 140 meq/L (ref 135–145)
Total Bilirubin: 0.5 mg/dL (ref 0.2–1.2)
Total Protein: 6.6 g/dL (ref 6.0–8.3)

## 2013-07-12 LAB — CBC
HCT: 35.3 % — ABNORMAL LOW (ref 39.0–52.0)
Hemoglobin: 11.6 g/dL — ABNORMAL LOW (ref 13.0–17.0)
MCH: 28.9 pg (ref 26.0–34.0)
MCHC: 32.9 g/dL (ref 30.0–36.0)
MCV: 87.8 fL (ref 78.0–100.0)
Platelets: 296 10*3/uL (ref 150–400)
RBC: 4.02 MIL/uL — AB (ref 4.22–5.81)
RDW: 12.6 % (ref 11.5–15.5)
WBC: 6.7 10*3/uL (ref 4.0–10.5)

## 2013-07-12 LAB — HEPATITIS C ANTIBODY: HCV AB: NEGATIVE

## 2013-07-12 LAB — POCT GLYCOSYLATED HEMOGLOBIN (HGB A1C): HEMOGLOBIN A1C: 8.7

## 2013-07-12 MED ORDER — MONTELUKAST SODIUM 10 MG PO TABS: 10.0000 mg | ORAL_TABLET | Freq: Every day | ORAL | Status: DC

## 2013-07-12 MED ORDER — GLYBURIDE 5 MG PO TABS: 10.0000 mg | ORAL_TABLET | Freq: Two times a day (BID) | ORAL | Status: DC

## 2013-07-12 NOTE — Assessment & Plan Note (Addendum)
A: pt continues to have itching and excortication that appears to be out of proportion to his eczema while using triamcinolone daily; I am still worried about neurodermatitis but also IgE mediated inflammation causing itching since he also has allergic rhinitis P:  - cont triamcinolone daily - counseled on dangers of daily high dose steroids - start Singulair 10 mg daily and check serum IgE level - followup in one month

## 2013-07-12 NOTE — Progress Notes (Signed)
Patient ID: Chad Hall, male   DOB: May 20, 1950, 63 y.o.   MRN: [id number]  Subjective:   Diabetes  Recent Issues:  Hypoglycemia: No   Medication Compliance: Diabetes medication: Yes Taking ACE-I: Yes Taking statin: Yes  Behavioral: Home CBG Monitoring: No Diet changes: No Exercise: No  Health Maintenance: Visual problems: permanently blind from marfan's syndrome Last eye exam: n/a Last dental visit: goes every 6 month, Dr. Renae Fickle Scheeler Foot ulcers: No Vaccinations: have received pneumonvax, 23 valent  Dermatitis - chronic eczema vs neurodermatitis, has seen a dermatologist for this and has been evaluated by me for this issue numerous times; no clear etiology; he is currently using triamcinolone once daily and lubriderm once daily and does not believe that this is helping; he denies any known irritant  Current Outpatient Prescriptions on File Prior to Visit  Medication Sig Dispense Refill  . augmented betamethasone dipropionate (DIPROLENE-AF) 0.05 % cream Apply two times daily  50 g  1  . diclofenac (VOLTAREN) 75 MG EC tablet Take 1 tablet (75 mg total) by mouth 2 (two) times daily.  20 tablet  0  . enalapril (VASOTEC) 20 MG tablet Take 1 tablet (20 mg total) by mouth daily.  90 tablet  3  . fluticasone (FLONASE) 50 MCG/ACT nasal spray Use 2 sprays nasally daily  48 g  3  . glyBURIDE (DIABETA) 5 MG tablet Take 2 tablets (10 mg  total) by mouth daily with  breakfast.  180 tablet  3  . hydrochlorothiazide (HYDRODIURIL) 25 MG tablet Take 1 tablet (25 mg total) by mouth daily.  90 tablet  3  . hydrOXYzine (ATARAX/VISTARIL) 25 MG tablet       . metFORMIN (GLUCOPHAGE) 1000 MG tablet Take 1 tablet (1,000 mg  total) by mouth 2 (two)  times daily with a meal.  180 tablet  3  . metoprolol succinate (TOPROL-XL) 25 MG 24 hr tablet Take 1 tablet by mouth  daily  90 tablet  3  . simvastatin (ZOCOR) 40 MG tablet Take 1 tablet (40 mg total) by mouth at bedtime.  90 tablet  3  .  triamcinolone cream (KENALOG) 0.1 % Apply to affected area(s)  two times daily  454 g  1   No current facility-administered medications on file prior to visit.   Social - Pt's wife  (also blind) has tachy-arrythmias   Review of Systems:  Pertinent items are noted in HPI.     Objective:   Physical Exam: BP 142/90  Pulse 85  Temp(Src) 98.7 F (37.1 C) (Oral)  Ht 6\' 5"  (1.956 m)  Wt 246 lb (111.585 kg)  BMI 29.17 kg/m2  General: alert, well appearing, and in no distress and oriented to person, place, and time Eyes: deferred Cardiovascular: RRR Pulmonary: clear to auscultation bilaterally Skin: diffuse excoriations on extremities, back and truck with thickening of skin on RUE and RLE   Labs:   Diabetic Labs:  Lab Results  Component Value Date   HGBA1C 8.7 07/12/2013   HGBA1C 8.5 04/10/2013   HGBA1C 8.8 11/09/2012   Lab Results  Component Value Date   LDLCALC 86 08/08/2012   CREATININE 1.23 08/08/2012   Last microalbumin: No results found for this basename: MICROALBUR,  [id number]        Assessment & Plan:

## 2013-07-12 NOTE — Patient Instructions (Signed)
Nice to see you. I will let you know of any changes that need to be made based upon your lab results.   Sincerely,   Dr. Clinton Sawyer

## 2013-07-12 NOTE — Addendum Note (Signed)
Addended by: Garnetta Buddy on: 07/12/2013 03:19 PM   Modules accepted: Orders

## 2013-07-12 NOTE — Assessment & Plan Note (Signed)
A: patient with persistently elevated A1c of 8.5 or greater for the last 9 months which is resulting from lack of physical activity, no adherence to a diabetic diet, and likely underdosing medication; after a long conversation with the patient he was willing to change his medication dose P:   - Continue metformin 1000 mg twice a day  - Increase glyburide to 10 mg twice a day  - Encourage regular exercise and adherence to diabetic diet

## 2013-07-13 ENCOUNTER — Telehealth: Payer: Self-pay | Admitting: Family Medicine

## 2013-07-13 LAB — IGE: IGE (IMMUNOGLOBULIN E), SERUM: 711.5 [IU]/mL — AB (ref 0.0–180.0)

## 2013-07-13 NOTE — Telephone Encounter (Signed)
Please call the patient and tell him that his kidney function is slightly worse that it was last year. Therefore, he needs to stop taking metformin and avoid any ibuprofen or Alleve. He should drink plenty of fluids and follow up in clinic in one week for an evaluation and repeat check of his kidneys.

## 2013-07-14 NOTE — Telephone Encounter (Signed)
Left message to return call. Please read message from Dr Clinton Sawyer.Chad Hall

## 2013-07-18 ENCOUNTER — Encounter: Payer: Self-pay | Admitting: Family Medicine

## 2013-07-18 ENCOUNTER — Ambulatory Visit (INDEPENDENT_AMBULATORY_CARE_PROVIDER_SITE_OTHER): Payer: Medicare Other | Admitting: Family Medicine

## 2013-07-18 VITALS — BP 144/87 | HR 96 | Ht 77.0 in | Wt 243.0 lb

## 2013-07-18 DIAGNOSIS — N179 Acute kidney failure, unspecified: Secondary | ICD-10-CM | POA: Insufficient documentation

## 2013-07-18 DIAGNOSIS — E1149 Type 2 diabetes mellitus with other diabetic neurological complication: Secondary | ICD-10-CM

## 2013-07-18 LAB — POCT UA - MICROSCOPIC ONLY

## 2013-07-18 LAB — POCT URINALYSIS DIPSTICK
Bilirubin, UA: NEGATIVE
Blood, UA: NEGATIVE
Glucose, UA: 500
Ketones, UA: NEGATIVE
LEUKOCYTES UA: NEGATIVE
Nitrite, UA: NEGATIVE
PROTEIN UA: NEGATIVE
Spec Grav, UA: 1.015
UROBILINOGEN UA: 0.2
pH, UA: 5.5

## 2013-07-18 LAB — BASIC METABOLIC PANEL WITH GFR
BUN: 40 mg/dL — ABNORMAL HIGH (ref 6–23)
CALCIUM: 9.5 mg/dL (ref 8.4–10.5)
CO2: 25 mEq/L (ref 19–32)
CREATININE: 1.92 mg/dL — AB (ref 0.50–1.35)
Chloride: 101 mEq/L (ref 96–112)
GFR, EST AFRICAN AMERICAN: 42 mL/min — AB
GFR, Est Non African American: 36 mL/min — ABNORMAL LOW
Glucose, Bld: 459 mg/dL — ABNORMAL HIGH (ref 70–99)
Potassium: 4.6 mEq/L (ref 3.5–5.3)
SODIUM: 136 meq/L (ref 135–145)

## 2013-07-18 NOTE — Progress Notes (Signed)
   Subjective:    Patient ID: Chad Hall, male    DOB: [birth year], 63 y.o.   MRN: [id number]  HPI  63 year old male with HTN, type 2 diabetes, Marfan syndrome, blindness, and chronic diffuse cutaneous itching who presents for evaluation of AKI.   AKI - the patient's creatinine was 1.61 last week. Approximately one year prior it was 1.23 and 2 years prior it was 1.07. He has no history of AKI or CKD. He does not have a history of BPH and denies symptoms (see scoring below). He has been seen by Dr. Brunilda Payor, urologist, for a prosthesis and was told that he had a cyst, but it did not require follow up. The patient takes anti-inflammatory 1-2 x per day. He drinks 40-64 ounces of water. No history of AKI or renal infection.   PMH - uncontrolled DM type 2, HTN   Current Outpatient Prescriptions on File Prior to Visit  Medication Sig Dispense Refill  . augmented betamethasone dipropionate (DIPROLENE-AF) 0.05 % cream Apply two times daily  50 g  1  . diclofenac (VOLTAREN) 75 MG EC tablet Take 1 tablet (75 mg total) by mouth 2 (two) times daily.  20 tablet  0  . enalapril (VASOTEC) 20 MG tablet Take 1 tablet (20 mg total) by mouth daily.  90 tablet  3  . fluticasone (FLONASE) 50 MCG/ACT nasal spray Use 2 sprays nasally daily  48 g  3  . glyBURIDE (DIABETA) 5 MG tablet Take 2 tablets (10 mg total) by mouth 2 (two) times daily with a meal.  360 tablet  3  . hydrochlorothiazide (HYDRODIURIL) 25 MG tablet Take 1 tablet (25 mg total) by mouth daily.  90 tablet  3  . hydrOXYzine (ATARAX/VISTARIL) 25 MG tablet       . metFORMIN (GLUCOPHAGE) 1000 MG tablet Take 1 tablet (1,000 mg  total) by mouth 2 (two)  times daily with a meal.  180 tablet  3  . metoprolol succinate (TOPROL-XL) 25 MG 24 hr tablet Take 1 tablet by mouth  daily  90 tablet  3  . montelukast (SINGULAIR) 10 MG tablet Take 1 tablet (10 mg total) by mouth at bedtime.  30 tablet  3  . simvastatin (ZOCOR) 40 MG tablet Take 1 tablet (40 mg total)  by mouth at bedtime.  90 tablet  3  . triamcinolone cream (KENALOG) 0.1 % Apply to affected area(s)  two times daily  454 g  1   No current facility-administered medications on file prior to visit.     Review of Systems Negative for dysuria, hematuria, swelling of extremities     Objective:   Physical Exam BP 144/87  Pulse 96  Ht 6\' 5"  (1.956 m)  Wt 243 lb (110.224 kg)  BMI 28.81 kg/m2  General: alert, well appearing, and in no distress and oriented to person, place, and time  Cardiovascular: RRR  Pulmonary: clear to auscultation bilaterally Extremities: no edema   AUA BPH scale 7 (low risk)     Assessment & Plan:

## 2013-07-18 NOTE — Patient Instructions (Signed)
We need to get labs to check your kidneys.  For now, stop all anti-inflammatory medication and metformin.  I will call you with results.   Take Care,   Dr. Clinton Sawyer

## 2013-07-18 NOTE — Assessment & Plan Note (Signed)
Poorly controlled as previously documented, but need to hold Metformin at this point due to increasing creatinine. Cont glyburide 10 mg BID. Consider switching to glipizide for renal insufficiency and the addition of another medication as needed.

## 2013-07-18 NOTE — Telephone Encounter (Signed)
Patient had office visit today with Dr Clinton Sawyer.Chad Hall Chad Hall

## 2013-07-18 NOTE — Assessment & Plan Note (Signed)
A: pt with increase in creat from 1.23 to 1.67 in 1 year duration, BUN: Creat > 20: 1, so it may be a pre-renal issue; could be complicated by NSAID use; concern for nephropathy from HTN and DM also likely; no suspicion for post-obstructive at this point P:  - check urine micro, FeNA, and BMP with GFR - stop NSAIDS - hold metformin

## 2013-07-19 ENCOUNTER — Telehealth: Payer: Self-pay | Admitting: Family Medicine

## 2013-07-19 DIAGNOSIS — N179 Acute kidney failure, unspecified: Secondary | ICD-10-CM

## 2013-07-19 DIAGNOSIS — IMO0002 Reserved for concepts with insufficient information to code with codable children: Secondary | ICD-10-CM

## 2013-07-19 DIAGNOSIS — E1165 Type 2 diabetes mellitus with hyperglycemia: Secondary | ICD-10-CM

## 2013-07-19 LAB — CREATININE, URINE, RANDOM: CREATININE, URINE: 108.7 mg/dL

## 2013-07-19 LAB — SODIUM, URINE, RANDOM: SODIUM UR: 51 meq/L

## 2013-07-19 NOTE — Telephone Encounter (Signed)
I called the patient to let him know that his kidney function was slightly worse. He needs to stop taking his hydrochlorothiazide at this time. I instructed him to call back regarding further evaluation of the kidneys and his blood sugar.

## 2013-07-20 NOTE — Telephone Encounter (Signed)
Left message to call regarding test results

## 2013-07-20 NOTE — Telephone Encounter (Signed)
Call Mr. Salaiz back at work [phone]

## 2013-07-21 MED ORDER — LINAGLIPTIN 5 MG PO TABS: 5.0000 mg | ORAL_TABLET | Freq: Every day | ORAL | Status: DC

## 2013-07-21 NOTE — Telephone Encounter (Signed)
Chad Hall call to discuss this case. Given the following recommendations:   1. Stop hydrochlorothiazide and lisinopril 2. Get renal ultrasound  3. Order tradjenta for diabetes 4. Place referral for nephrology 5. Follow up for a lab visit next week  Patient agreeable to plan

## 2013-07-21 NOTE — Telephone Encounter (Signed)
Message left for patient at work to call me at the office.

## 2013-07-26 ENCOUNTER — Other Ambulatory Visit: Payer: Medicare Other

## 2013-07-26 DIAGNOSIS — N179 Acute kidney failure, unspecified: Secondary | ICD-10-CM

## 2013-07-26 LAB — BASIC METABOLIC PANEL WITH GFR
BUN: 32 mg/dL — ABNORMAL HIGH (ref 6–23)
CO2: 27 meq/L (ref 19–32)
CREATININE: 1.74 mg/dL — AB (ref 0.50–1.35)
Calcium: 10 mg/dL (ref 8.4–10.5)
Chloride: 100 mEq/L (ref 96–112)
GFR, Est African American: 47 mL/min — ABNORMAL LOW
GFR, Est Non African American: 41 mL/min — ABNORMAL LOW
GLUCOSE: 428 mg/dL — AB (ref 70–99)
Potassium: 4.8 mEq/L (ref 3.5–5.3)
Sodium: 136 mEq/L (ref 135–145)

## 2013-07-26 NOTE — Progress Notes (Signed)
BMP DONE TODAY Farhaan Mabee 

## 2013-07-27 ENCOUNTER — Telehealth: Payer: Self-pay | Admitting: Family Medicine

## 2013-07-27 NOTE — Telephone Encounter (Signed)
Patient called and informed that his kidney function was improving and to stay off of the blood pressure medications still. I told him to come for a visit on 08/07/13 at 8:45 AM. He is agreeable.

## 2013-08-01 ENCOUNTER — Telehealth: Payer: Self-pay | Admitting: *Deleted

## 2013-08-01 NOTE — Telephone Encounter (Signed)
Spoke with patient, appointment will be on the 19th at 11:15 am instead of 6/22.Busick, Rodena Medinobert Lee

## 2013-08-01 NOTE — Telephone Encounter (Signed)
Left message to return call. Please tell patient [facility] 08/07/12 at 8:45 am at the [address] location. There is no need to be fasting for this.Busick, Rodena Medinobert Lee

## 2013-08-04 ENCOUNTER — Ambulatory Visit
Admission: RE | Admit: 2013-08-04 | Discharge: 2013-08-04 | Disposition: A | Discharge status: Home / Self Care | Payer: Medicare Other | Source: Ambulatory Visit | Attending: Family Medicine | Admitting: Family Medicine

## 2013-08-04 DIAGNOSIS — N179 Acute kidney failure, unspecified: Secondary | ICD-10-CM

## 2013-08-04 IMAGING — US US RENAL
1 series · 14 of 25 positions shown · non-contrast
Comparison: None.

CLINICAL DATA: Acute kidney injury

EXAM:
RENAL/URINARY TRACT ULTRASOUND COMPLETE

[Series 1: us renal · 0.32mm/px · 14 of 29 slices shown]
[im 1/29]
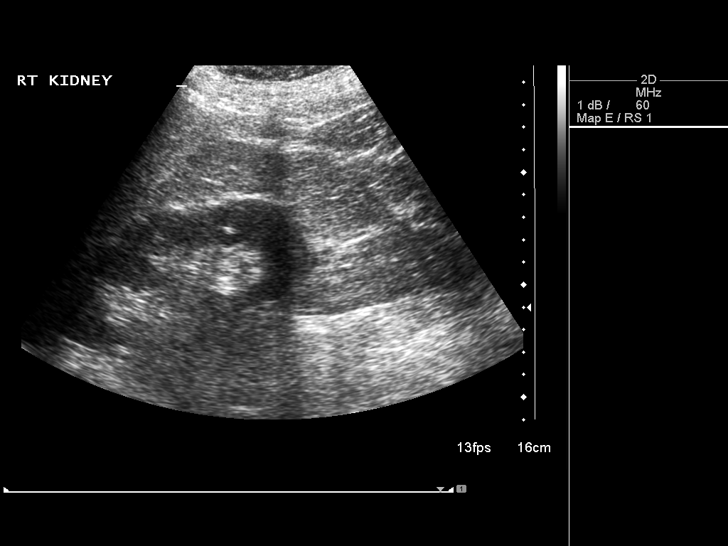
[im 3/29]
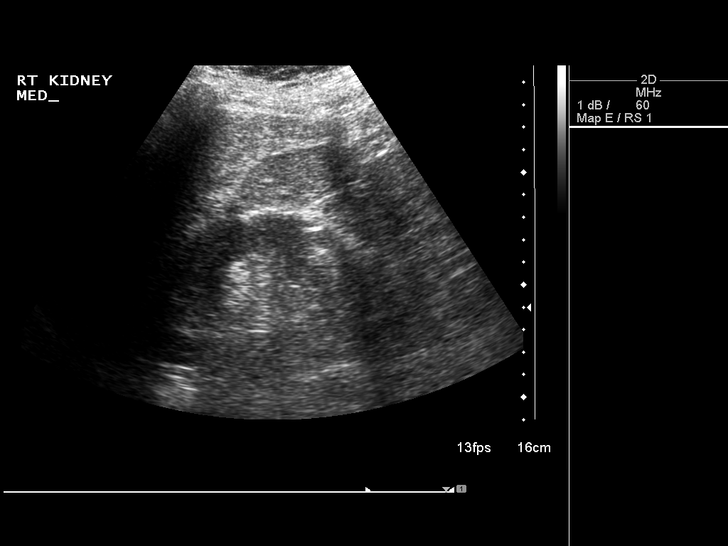
[im 5/29]
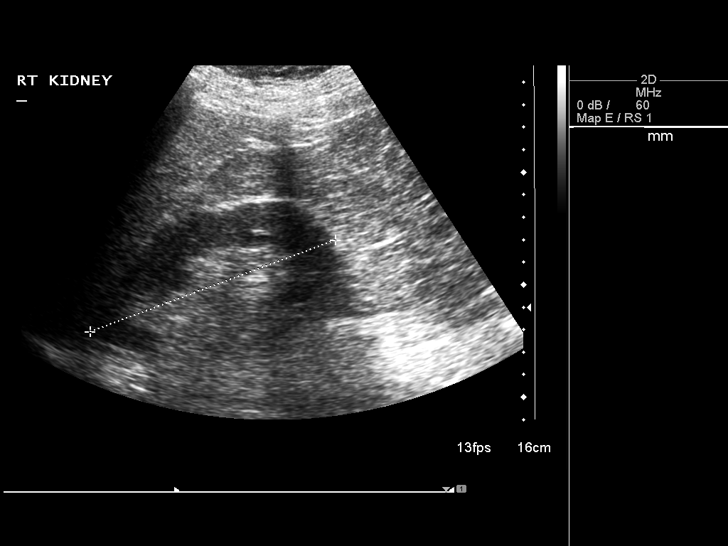
[im 8/29]
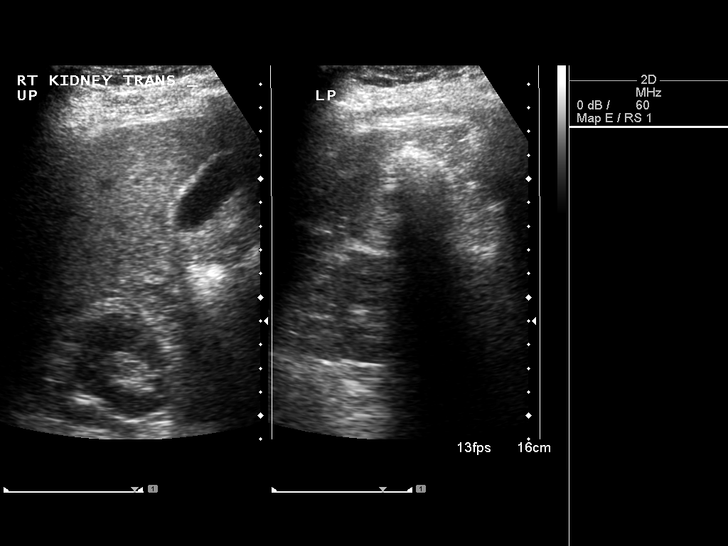
[im 10/29]
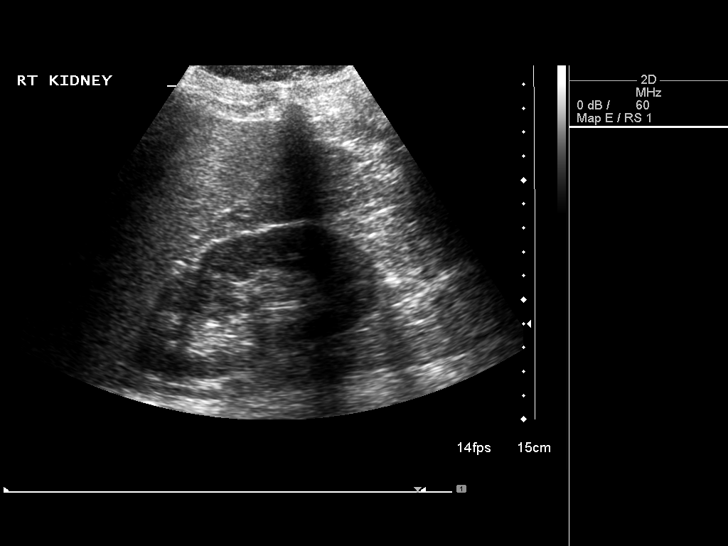
[im 11/29]
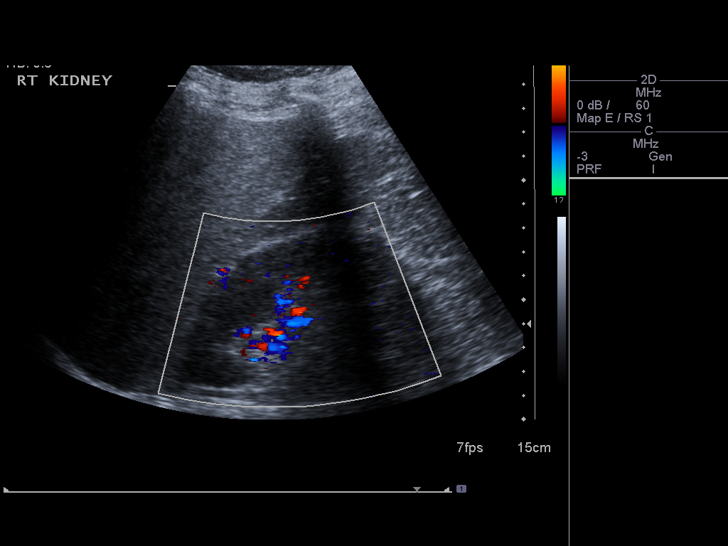
[im 13/29]
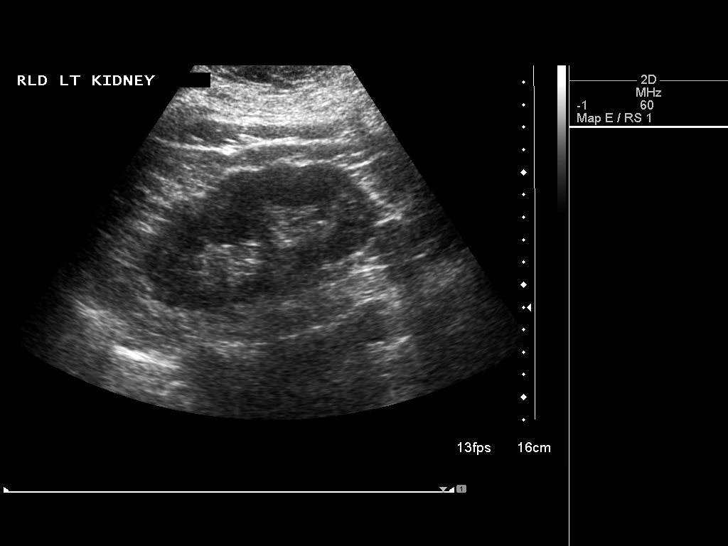
[im 16/29]
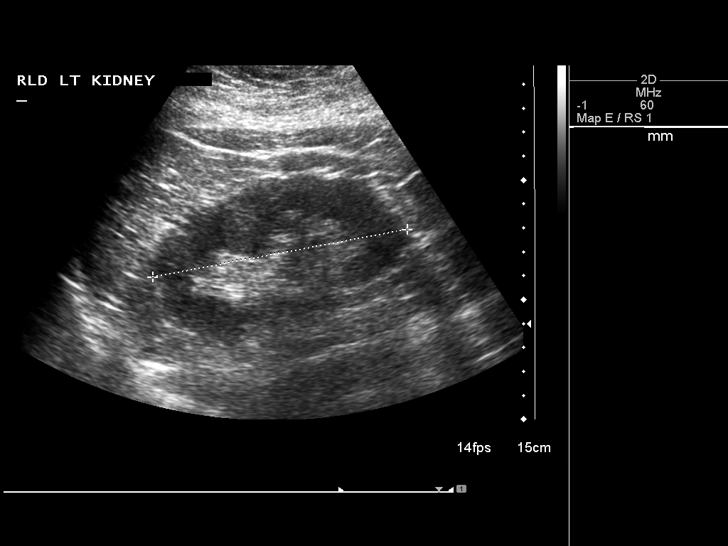
[im 18/29]
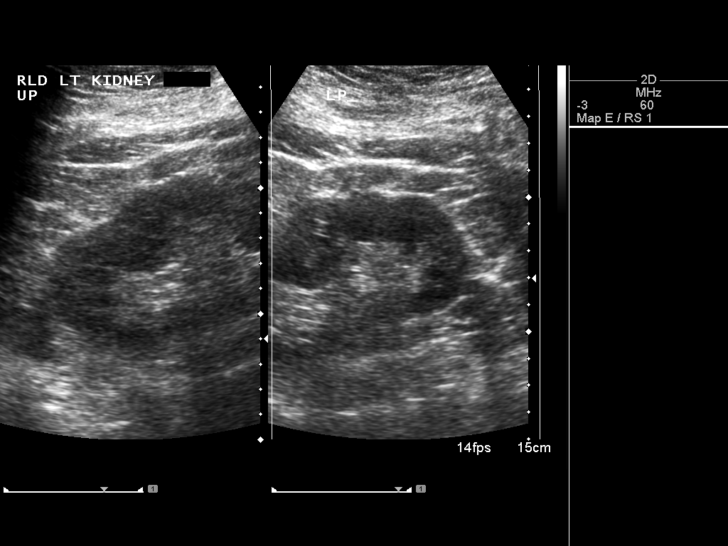
[im 19/29]
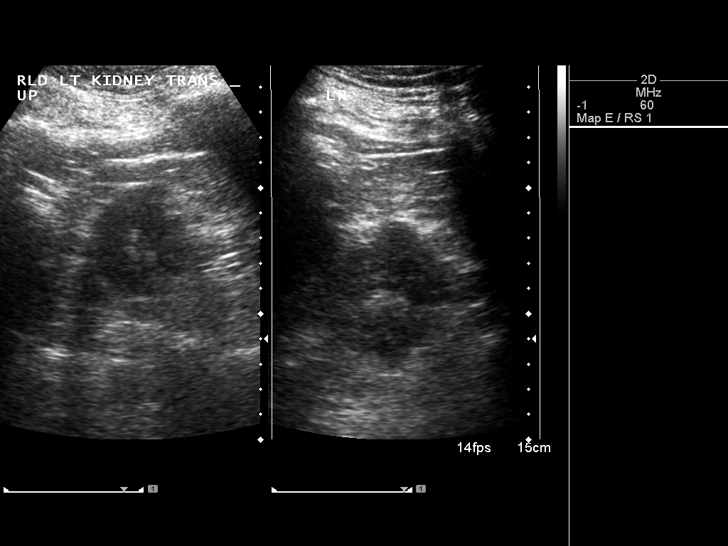
[im 22/29]
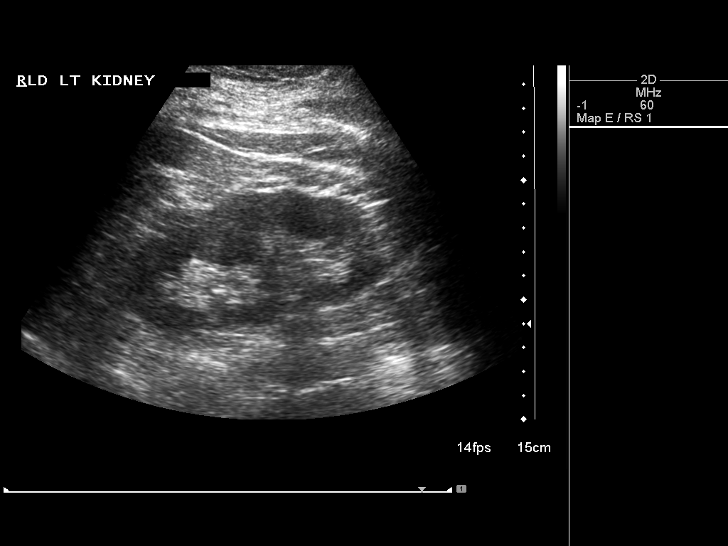
[im 24/29]
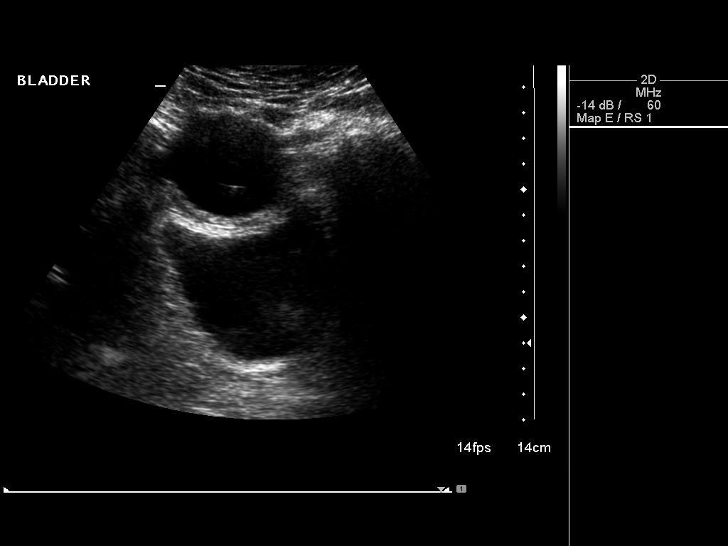
[im 26/29]
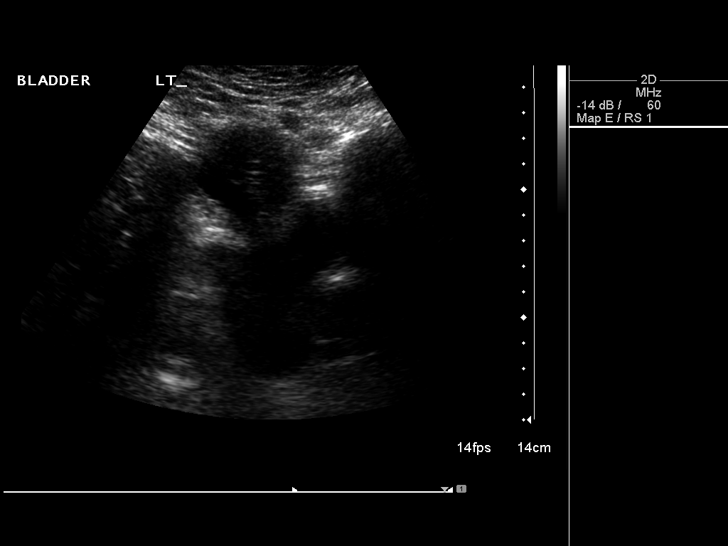
[im 29/29]
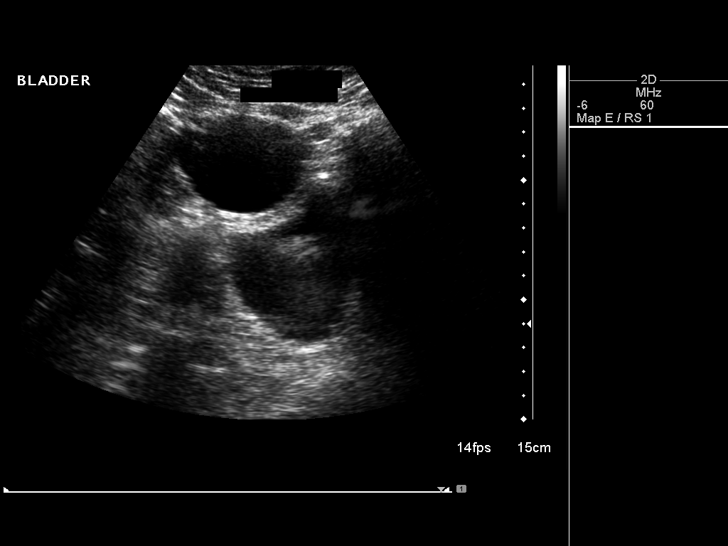

[14 of 25 positions shown; findings below may reference images not displayed]

FINDINGS: Right Kidney:

Length: 11.6 cm. Echogenicity within normal limits. No mass or
hydronephrosis visualized.

Left Kidney:

Length: 11.1 cm. Echogenicity within normal limits. No mass or
hydronephrosis visualized.

Bladder:

Appears normal for degree of bladder distention. Penile prosthesis
noted in the pelvis.
IMPRESSION: No acute findings.  No hydronephrosis.

## 2013-08-07 ENCOUNTER — Encounter: Payer: Self-pay | Admitting: Family Medicine

## 2013-08-07 ENCOUNTER — Ambulatory Visit (INDEPENDENT_AMBULATORY_CARE_PROVIDER_SITE_OTHER): Payer: Medicare Other | Admitting: Family Medicine

## 2013-08-07 ENCOUNTER — Other Ambulatory Visit: Payer: Self-pay | Admitting: Family Medicine

## 2013-08-07 VITALS — BP 165/100 | HR 87 | Temp 98.0°F | Ht 77.0 in | Wt 243.0 lb

## 2013-08-07 DIAGNOSIS — I10 Essential (primary) hypertension: Secondary | ICD-10-CM

## 2013-08-07 DIAGNOSIS — N179 Acute kidney failure, unspecified: Secondary | ICD-10-CM

## 2013-08-07 DIAGNOSIS — N183 Chronic kidney disease, stage 3 unspecified: Secondary | ICD-10-CM

## 2013-08-07 DIAGNOSIS — E1149 Type 2 diabetes mellitus with other diabetic neurological complication: Secondary | ICD-10-CM

## 2013-08-07 LAB — BASIC METABOLIC PANEL WITH GFR
BUN: 23 mg/dL (ref 6–23)
CHLORIDE: 102 meq/L (ref 96–112)
CO2: 24 mEq/L (ref 19–32)
Calcium: 9.9 mg/dL (ref 8.4–10.5)
Creat: 1.51 mg/dL — ABNORMAL HIGH (ref 0.50–1.35)
GFR, Est African American: 56 mL/min — ABNORMAL LOW
GFR, Est Non African American: 48 mL/min — ABNORMAL LOW
GLUCOSE: 422 mg/dL — AB (ref 70–99)
Potassium: 4.5 mEq/L (ref 3.5–5.3)
SODIUM: 137 meq/L (ref 135–145)

## 2013-08-07 NOTE — Assessment & Plan Note (Signed)
A: stable elevated, on toprol XL 25 mg daily as monotherapy P; check creat and GFR today and if stable, restart ACE-I at low dose; likely to avoid diuretic

## 2013-08-07 NOTE — Progress Notes (Signed)
   Subjective:    Patient ID: Chad Hall, male    DOB: 12/26/1950, 63 y.o.   MRN: [id number]  HPI  63 year old male with HTN, type 2 diabetes, Marfan syndrome, blindness, and chronic diffuse cutaneous itching who presents for evaluation of AKI.   AKI - First noted in May 2015 with creat 1.61 (up from baseline of 1.2 the year before), Creatinine increased to 1.92 and there was concern for pre-renal injury so Thiazide and ACE-I stopped; creat improved to 1.74; recent renal ultrasound within normal limits that was performed on 08/04/13; pt denies any new swelling    DM type 2 - A1C consistently > 8; metformin stopped last month due to AKI; pt increased glyburide to 10 mg BID, but glucose still > 400 on BMP check, so added tradjenta and patient started that 4 days ago, denies any negative side effects  HTN - Pt taken off HCTZ and enalapril due to AKI, only taking toprol XL - 25 mg; he denies any chest pain, TIA symptoms or swelling  Review of Systems Negative for chest pain, dyspnea on exertion, or edema, weakness, nausea, vomiting, diarrhea, abdominal pain    Objective:   Physical Exam BP 165/100  Pulse 87  Temp(Src) 98 F (36.7 C) (Oral)  Ht 6\' 5"  (1.956 m)  Wt 243 lb (110.224 kg)  BMI 28.81 kg/m2  BP rechecked at was 138/94  General: alert, well appearing, and in no distress and oriented to person, place, and time  Cardiovascular: RRR  Pulmonary: clear to auscultation bilaterally  Extremities: no edema       Assessment & Plan:

## 2013-08-07 NOTE — Assessment & Plan Note (Signed)
A: stable P:  - still favor pre-renal with some intrinsic nephropathy due to HTN and DM; post-renal ruled out - check GFR today and restart BP med as tolerated

## 2013-08-07 NOTE — Assessment & Plan Note (Signed)
Continue tradjenta 5 mg daily and glyburide 10 mg BID; f/u LFT's in Sept 2015 since DPP4 inihibitors can rarely affect liver

## 2013-08-08 ENCOUNTER — Telehealth: Payer: Self-pay | Admitting: Family Medicine

## 2013-08-08 DIAGNOSIS — I10 Essential (primary) hypertension: Secondary | ICD-10-CM

## 2013-08-08 NOTE — Telephone Encounter (Signed)
Pt returned call and said that [facility] [address] [place] and have them to deliver it. Myriam Jacobsonjw

## 2013-08-08 NOTE — Telephone Encounter (Signed)
Message left for patient that kidney function improving and that he needs to be restarted on enalapril. I would like to start 5 mg daily. So he needs to call and let me know which pharmacy he would like to use.

## 2013-08-09 MED ORDER — ENALAPRIL MALEATE 5 MG PO TABS: 5.0000 mg | ORAL_TABLET | Freq: Every day | ORAL | Status: DC

## 2013-08-09 NOTE — Telephone Encounter (Signed)
Left message on voicemail informing patient that rx had been sent in but for him to call pharmacy to make sure it gets delivered.

## 2013-08-09 NOTE — Telephone Encounter (Signed)
Prescription sent to [facility].  Please let patient know.

## 2013-08-09 NOTE — Telephone Encounter (Signed)
Pt called about refill on enapril ?

## 2013-09-18 ENCOUNTER — Other Ambulatory Visit: Payer: Self-pay | Admitting: *Deleted

## 2013-09-19 MED ORDER — BETAMETHASONE DIPROPIONATE AUG 0.05 % EX CREA: TOPICAL_CREAM | CUTANEOUS | Status: DC

## 2013-09-19 MED ORDER — TRIAMCINOLONE ACETONIDE 0.1 % EX CREA: TOPICAL_CREAM | CUTANEOUS | Status: DC

## 2013-10-06 ENCOUNTER — Encounter: Payer: Self-pay | Admitting: *Deleted

## 2013-10-06 ENCOUNTER — Encounter: Payer: Self-pay | Admitting: Family Medicine

## 2013-10-06 ENCOUNTER — Ambulatory Visit (INDEPENDENT_AMBULATORY_CARE_PROVIDER_SITE_OTHER): Payer: Medicare Other | Admitting: Family Medicine

## 2013-10-06 VITALS — BP 166/92 | HR 67 | Temp 97.8°F | Ht 77.5 in | Wt 237.9 lb

## 2013-10-06 DIAGNOSIS — L2089 Other atopic dermatitis: Secondary | ICD-10-CM

## 2013-10-06 DIAGNOSIS — L209 Atopic dermatitis, unspecified: Secondary | ICD-10-CM

## 2013-10-06 DIAGNOSIS — N179 Acute kidney failure, unspecified: Secondary | ICD-10-CM

## 2013-10-06 DIAGNOSIS — J309 Allergic rhinitis, unspecified: Secondary | ICD-10-CM

## 2013-10-06 LAB — COMPREHENSIVE METABOLIC PANEL
ALBUMIN: 4 g/dL (ref 3.5–5.2)
ALT: 18 U/L (ref 0–53)
AST: 14 U/L (ref 0–37)
Alkaline Phosphatase: 72 U/L (ref 39–117)
BUN: 25 mg/dL — ABNORMAL HIGH (ref 6–23)
CALCIUM: 9.4 mg/dL (ref 8.4–10.5)
CHLORIDE: 105 meq/L (ref 96–112)
CO2: 28 mEq/L (ref 19–32)
CREATININE: 1.35 mg/dL (ref 0.50–1.35)
GLUCOSE: 289 mg/dL — AB (ref 70–99)
Potassium: 4.3 mEq/L (ref 3.5–5.3)
Sodium: 140 mEq/L (ref 135–145)
Total Bilirubin: 0.6 mg/dL (ref 0.2–1.2)
Total Protein: 6.6 g/dL (ref 6.0–8.3)

## 2013-10-06 NOTE — Patient Instructions (Signed)
It was a pleasure meeting you today. Plan call me with any questions about your treatments.

## 2013-10-06 NOTE — Progress Notes (Signed)
Prior Authorization received from [facility] pharmacy for Glyburide 5 mg tab.   PA form placed in provider box for completion. Clovis PuMartin, Tamika L, RN

## 2013-10-06 NOTE — Assessment & Plan Note (Signed)
See the plan for atopic dermatitis.

## 2013-10-06 NOTE — Progress Notes (Signed)
Patient ID: Chad Hall, male   DOB: [birth year], 63 y.o.   MRN: [id number]  Mickie Hillier, MD, MS Phone: [phone]  Subjective:  Chief complaint  Pt Here for medication refill and to meet his new primary care provider. Patient states that he is doing quite well he feels as though his diabetes has been under better control recently. Patient states that his current major concern is the skin itching he experiences primarily on both arms. He also is complaining of nasal drainage. He states his been much better with compliance of his medications recently. No other current issues were reported.  Review of Systems  Constitutional: Negative for fever, chills and malaise/fatigue.  HENT: Negative for congestion.   Respiratory: Negative for cough and wheezing.   Cardiovascular: Negative for chest pain and palpitations.  Gastrointestinal: Negative for nausea, vomiting and diarrhea.  Skin: Positive for itching and rash.  Neurological: Negative for dizziness and headaches.  Psychiatric/Behavioral: Negative for depression.     Past Medical History Patient Active Problem List   Diagnosis Date Noted  . Acute kidney injury 07/18/2013  . Left hip pain 04/10/2013  . Dystrophic nail 11/10/2012  . Diabetic peripheral neuropathy associated with type 2 diabetes mellitus 11/10/2012  . Marfan's syndrome 08/08/2012  . Atopic dermatitis 02/16/2012  . Overweight (BMI 25.0-29.9) 07/05/2011  . Allergic rhinitis due to allergen 07/03/2011  . Venous insufficiency of leg 04/03/2011  . Blindness 07/23/2010  . DM (diabetes mellitus), type 2 with neurological complications 06/13/2010  . Hypertension 06/13/2010  . Hyperlipidemia 06/13/2010    Medications- reviewed and updated Current Outpatient Prescriptions  Medication Sig Dispense Refill  . augmented betamethasone dipropionate (DIPROLENE-AF) 0.05 % cream Apply two times daily  50 g  1  . enalapril (VASOTEC) 5 MG tablet Take 1 tablet (5 mg total) by  mouth daily.  30 tablet  1  . fluticasone (FLONASE) 50 MCG/ACT nasal spray Use 2 sprays nasally daily  48 g  3  . glyBURIDE (DIABETA) 5 MG tablet Take 2 tablets (10 mg total) by mouth 2 (two) times daily with a meal.  360 tablet  3  . hydrochlorothiazide (HYDRODIURIL) 25 MG tablet Take 1 tablet (25 mg total) by mouth daily.  90 tablet  3  . hydrOXYzine (ATARAX/VISTARIL) 25 MG tablet       . linagliptin (TRADJENTA) 5 MG TABS tablet Take 1 tablet (5 mg total) by mouth daily.  30 tablet  5  . metoprolol succinate (TOPROL-XL) 25 MG 24 hr tablet Take 1 tablet by mouth  daily  90 tablet  3  . montelukast (SINGULAIR) 10 MG tablet Take 1 tablet (10 mg total) by mouth at bedtime.  30 tablet  3  . simvastatin (ZOCOR) 40 MG tablet Take 1 tablet (40 mg total) by mouth at bedtime.  90 tablet  3  . triamcinolone cream (KENALOG) 0.1 % Apply to affected area(s)  two times daily  454 g  1   No current facility-administered medications for this visit.    Objective: BP 166/92  Pulse 67  Temp(Src) 97.8 F (36.6 C) (Oral)  Ht 6' 5.5" (1.969 m)  Wt 237 lb 14.4 oz (107.911 kg)  BMI 27.83 kg/m2 Gen: NAD, alert, cooperative with exam, currently joined by his seeing eye dog HEENT: NCAT, patient is blind CV: RRR, good S1/S2, no murmur Resp: CTABL, no wheezes, non-labored Abd: Soft, Non Tender, Non Distended, BS present, no guarding or organomegaly Ext: No edema, warm Neuro: Alert and oriented, No gross deficits  Assessment/Plan:  Atopic dermatitis Patient states to have significant itching over his arms bilaterally. The 2 topical creams that he is currently prescribed he states are currently working well however did not completely alleviate this issue. At this time I recommended the possibility of changing his Kenalog cream to an ointment due to the positive moisturizing affects he could experience. I also run up the possibility of a non-sedating antihistamine like Zyrtec. Patient was very anxious to and the  oral nature of Zyrtec. I discussed with him that he could take this medication as long as his GFR was found to be adequate. BMP was obtained.  Zyrtec could also have a positive effect on his current rhinorrhea.  Allergic rhinitis due to allergen See the plan for atopic dermatitis.    Orders Placed This Encounter  Procedures  . Comprehensive metabolic panel    No orders of the defined types were placed in this encounter.

## 2013-10-06 NOTE — Assessment & Plan Note (Addendum)
Patient states to have significant itching over his arms bilaterally. The 2 topical creams that he is currently prescribed he states are currently working well however did not completely alleviate this issue. At this time I recommended the possibility of changing his Kenalog cream to an ointment due to the positive moisturizing affects he could experience. I also run up the possibility of a non-sedating antihistamine like Zyrtec. Patient was very anxious to and the oral nature of Zyrtec. I discussed with him that he could take this medication as long as his GFR was found to be adequate. BMP was obtained.  Zyrtec could also have a positive effect on his current rhinorrhea.

## 2013-10-14 ENCOUNTER — Other Ambulatory Visit: Payer: Self-pay | Admitting: Family Medicine

## 2013-10-16 ENCOUNTER — Other Ambulatory Visit: Payer: Self-pay | Admitting: *Deleted

## 2013-10-16 DIAGNOSIS — N179 Acute kidney failure, unspecified: Secondary | ICD-10-CM

## 2013-10-16 DIAGNOSIS — L508 Other urticaria: Secondary | ICD-10-CM

## 2013-10-16 DIAGNOSIS — E1165 Type 2 diabetes mellitus with hyperglycemia: Secondary | ICD-10-CM

## 2013-10-16 DIAGNOSIS — IMO0002 Reserved for concepts with insufficient information to code with codable children: Secondary | ICD-10-CM

## 2013-10-17 ENCOUNTER — Other Ambulatory Visit: Payer: Self-pay | Admitting: *Deleted

## 2013-10-17 DIAGNOSIS — I10 Essential (primary) hypertension: Secondary | ICD-10-CM

## 2013-10-17 MED ORDER — LINAGLIPTIN 5 MG PO TABS: 5.0000 mg | ORAL_TABLET | Freq: Every day | ORAL | Status: DC

## 2013-10-17 MED ORDER — ENALAPRIL MALEATE 5 MG PO TABS: 5.0000 mg | ORAL_TABLET | Freq: Every day | ORAL | Status: DC

## 2013-10-17 MED ORDER — MONTELUKAST SODIUM 10 MG PO TABS: 10.0000 mg | ORAL_TABLET | Freq: Every day | ORAL | Status: DC

## 2013-10-20 ENCOUNTER — Encounter: Payer: Self-pay | Admitting: Clinical

## 2013-10-20 ENCOUNTER — Ambulatory Visit (INDEPENDENT_AMBULATORY_CARE_PROVIDER_SITE_OTHER): Payer: Medicare Other | Admitting: *Deleted

## 2013-10-20 DIAGNOSIS — Z23 Encounter for immunization: Secondary | ICD-10-CM

## 2013-10-20 NOTE — Progress Notes (Signed)
Pt and spouse arrived to complete advanced directives. A copy of the packet will be added to the chart.  Theresia Bough, MSW, LCSW [phone]

## 2013-10-24 NOTE — Progress Notes (Signed)
PA for Glyburide approved from [facility]  Approved 10/04/2013 to 10/12/2014.  Authorization number [id number].  Clovis Pu, RN

## 2013-12-01 ENCOUNTER — Other Ambulatory Visit: Payer: Self-pay | Admitting: *Deleted

## 2013-12-01 MED ORDER — SIMVASTATIN 40 MG PO TABS: ORAL_TABLET | ORAL | Status: DC

## 2014-01-03 ENCOUNTER — Other Ambulatory Visit: Payer: Self-pay | Admitting: Family Medicine

## 2014-01-05 ENCOUNTER — Encounter: Payer: Self-pay | Admitting: Family Medicine

## 2014-01-05 ENCOUNTER — Ambulatory Visit (INDEPENDENT_AMBULATORY_CARE_PROVIDER_SITE_OTHER): Payer: Medicare Other | Admitting: Family Medicine

## 2014-01-05 VITALS — BP 170/80 | HR 70 | Temp 97.6°F | Ht 77.0 in | Wt 243.8 lb

## 2014-01-05 DIAGNOSIS — E1149 Type 2 diabetes mellitus with other diabetic neurological complication: Secondary | ICD-10-CM

## 2014-01-05 DIAGNOSIS — IMO0002 Reserved for concepts with insufficient information to code with codable children: Secondary | ICD-10-CM

## 2014-01-05 DIAGNOSIS — I10 Essential (primary) hypertension: Secondary | ICD-10-CM

## 2014-01-05 DIAGNOSIS — G629 Polyneuropathy, unspecified: Secondary | ICD-10-CM

## 2014-01-05 DIAGNOSIS — E1142 Type 2 diabetes mellitus with diabetic polyneuropathy: Secondary | ICD-10-CM

## 2014-01-05 DIAGNOSIS — E1141 Type 2 diabetes mellitus with diabetic mononeuropathy: Secondary | ICD-10-CM

## 2014-01-05 DIAGNOSIS — R300 Dysuria: Secondary | ICD-10-CM | POA: Insufficient documentation

## 2014-01-05 DIAGNOSIS — E1165 Type 2 diabetes mellitus with hyperglycemia: Secondary | ICD-10-CM

## 2014-01-05 LAB — POCT URINALYSIS DIPSTICK
Bilirubin, UA: NEGATIVE
Blood, UA: NEGATIVE
Glucose, UA: 500
KETONES UA: NEGATIVE
Leukocytes, UA: NEGATIVE
Nitrite, UA: NEGATIVE
PH UA: 5.5
Protein, UA: NEGATIVE
Spec Grav, UA: 1.02
Urobilinogen, UA: 1

## 2014-01-05 LAB — POCT GLYCOSYLATED HEMOGLOBIN (HGB A1C): Hemoglobin A1C: 11.3

## 2014-01-05 MED ORDER — ENALAPRIL MALEATE 5 MG PO TABS: 20.0000 mg | ORAL_TABLET | Freq: Every day | ORAL | Status: DC

## 2014-01-05 NOTE — Assessment & Plan Note (Signed)
Patient has been complaining of dysuria for the past week. He states that he experiences this primarily at the beginning of his urination episode and it dissipates gradually until gone at the end. Denies any flank pain. No suprapubic pain. No history of kidney stones. Denies any sexual risky behavior. Some recent nocturia. Some recent frequency.  - UA performed at this time. Was negative for any signs of infection.  - Possible urethritis. Patient has an uncontrolled diabetic and is blind. He also may have some BPH involved as well which can cause some urinary retention. If this persists a digital rectal exam may be warranted to check for BPH or even prostatitis.

## 2014-01-05 NOTE — Assessment & Plan Note (Signed)
Patient is uncontrolled diabetic. Increase metformin dosing likely warranted. A1c at today's visit was 11.3. I will contact patient after increasing dosages to inform him of these changes.

## 2014-01-05 NOTE — Patient Instructions (Signed)
It was a pleasure seeing you today in our clinic. Today we discussed your diabetes and recent painful urination. Here is the treatment plan we have discussed and agreed upon together:   - today I have ordered blood tests to be taken: A lipid panel, BMP, A1c. - We've taken a urine sample to test for infection. - We performed your annual diabetic foot exam today. - do not carry the shingles vaccine in our office, however I have provided you a prescription to have you receive one at your pharmacy.

## 2014-01-05 NOTE — Progress Notes (Signed)
   HPI  Patient presents today for diabetes follow-up. Patient is a 63 year old male who presents today for diabetes follow-up. Patient states he has been very compliant with his medication regimen. Today he is complaining about some dysuria which has been occurring for approximately one week. Dysuria begins at the beginning of his urination and usually has dissipated by the end. Patient does report some nocturia and frequency over the past year. Patient has been blind since childhood so he is unable to report any hematuria or other color changes. Patient denies any suprapubic pain or flank pain. Denies any risky sexual behavior.  Denies any fever chills diaphoresis nausea vomiting diarrhea shortness of breath confusion dizziness lightheadedness vertigo chest pain.  Smoking status noted ROS: Per HPI  Objective: BP 170/80 mmHg  Pulse 70  Temp(Src) 97.6 F (36.4 C) (Oral)  Ht 6\' 5"  (1.956 m)  Wt 243 lb 12.8 oz (110.587 kg)  BMI 28.90 kg/m2 Gen: NAD, alert, cooperative with exam, very tall, blind HEENT: NCAT CV: RRR, good S1/S2, no murmur Resp: CTABL, no wheezes, non-labored Abd: SNTND, BS present, no guarding or organomegaly Ext: No edema, warm; diabetic foot exam performed today see results in appropriate section. Neuro: Alert and oriented, No gross deficits  Assessment and plan:  Dysuria Patient has been complaining of dysuria for the past week. He states that he experiences this primarily at the beginning of his urination episode and it dissipates gradually until gone at the end. Denies any flank pain. No suprapubic pain. No history of kidney stones. Denies any sexual risky behavior. Some recent nocturia. Some recent frequency.  - UA performed at this time. Was negative for any signs of infection.  - Possible urethritis. Patient has an uncontrolled diabetic and is blind. He also may have some BPH involved as well which can cause some urinary retention. If this persists a digital  rectal exam may be warranted to check for BPH or even prostatitis.   Diabetic peripheral neuropathy associated with type 2 diabetes mellitus Patient is uncontrolled diabetic. Increase metformin dosing likely warranted. A1c at today's visit was 11.3. I will contact patient after increasing dosages to inform him of these changes.    Orders Placed This Encounter  Procedures  . Varicella-zoster vaccine subcutaneous  . BASIC METABOLIC PANEL WITH GFR  . Lipid Panel  . POCT urinalysis dipstick  . HgB A1c    Meds ordered this encounter  Medications  . enalapril (VASOTEC) 5 MG tablet    Sig: Take 4 tablets (20 mg total) by mouth daily.    Dispense:  30 tablet    Refill:  5    Please deliver to patient. Thank you.     Kathee DeltonIan D McKeag, MD,MS,  PGY1 01/05/2014 6:20 PM

## 2014-01-06 LAB — BASIC METABOLIC PANEL WITH GFR
BUN: 19 mg/dL (ref 6–23)
CALCIUM: 9.6 mg/dL (ref 8.4–10.5)
CO2: 27 mEq/L (ref 19–32)
CREATININE: 1.14 mg/dL (ref 0.50–1.35)
Chloride: 103 mEq/L (ref 96–112)
GFR, Est African American: 78 mL/min
GFR, Est Non African American: 67 mL/min
Glucose, Bld: 248 mg/dL — ABNORMAL HIGH (ref 70–99)
Potassium: 4.1 mEq/L (ref 3.5–5.3)
Sodium: 139 mEq/L (ref 135–145)

## 2014-01-06 LAB — LIPID PANEL
CHOL/HDL RATIO: 2.3 ratio
Cholesterol: 129 mg/dL (ref 0–200)
HDL: 55 mg/dL (ref >39)
LDL Cholesterol: 61 mg/dL (ref 0–99)
Triglycerides: 64 mg/dL (ref <150)
VLDL: 13 mg/dL (ref 0–40)

## 2014-01-09 ENCOUNTER — Telehealth: Payer: Self-pay | Admitting: Family Medicine

## 2014-01-09 NOTE — Telephone Encounter (Signed)
Pt called and needs someone to call the pharmacy because his prescription states 5 mg tablets X 4 to equal 20 mg a day. But it should be one 20 mg tablet once a day . The way it is written the qty is 30 which would be enough for 4 days. The patient would also like to have his lab results. jw

## 2014-01-09 NOTE — Telephone Encounter (Signed)
Spoke with patient, the medication he is referring to is enalapril. Will forward to PCP. Also I informed patient of lab results.

## 2014-01-16 MED ORDER — ENALAPRIL MALEATE 20 MG PO TABS: 20.0000 mg | ORAL_TABLET | Freq: Every day | ORAL | Status: DC

## 2014-01-16 NOTE — Telephone Encounter (Signed)
Medication change has been made. I apologize for the mistake.

## 2014-02-25 ENCOUNTER — Other Ambulatory Visit: Payer: Self-pay | Admitting: Family Medicine

## 2014-03-07 ENCOUNTER — Telehealth: Payer: Self-pay | Admitting: Family Medicine

## 2014-03-07 NOTE — Telephone Encounter (Signed)
Patient's insurance no longer covering rx for glipiride.  Need something else sent to on- line pharmacy for diabetes

## 2014-03-11 NOTE — Telephone Encounter (Signed)
Pt really needs to come in and be seen if they no longer cover his glipizide. Reviewing previous notes it looks like he was initially placed on Metformin but this was DC'd due to increasing Creatinine. Most recent labs show he has a GFR >60. As long as that continues, metformin may be able to be restarted w/ close monitoring.  Regardless, patient needs to be seen SOON in the clinic.  Thank you!

## 2014-03-15 NOTE — Telephone Encounter (Signed)
Attempted to call him and phone kept ringing, will try again later. Deseree Bruna PotterBlount, CMA

## 2014-03-16 NOTE — Telephone Encounter (Signed)
Left message on patient voicemail for him to call back and schedule an appointment before a new medication could be prescribed.

## 2014-03-26 ENCOUNTER — Ambulatory Visit (INDEPENDENT_AMBULATORY_CARE_PROVIDER_SITE_OTHER): Payer: 59 | Admitting: Family Medicine

## 2014-03-26 ENCOUNTER — Encounter: Payer: Self-pay | Admitting: Family Medicine

## 2014-03-26 VITALS — BP 199/102 | HR 108 | Temp 97.9°F | Ht 77.0 in | Wt 236.0 lb

## 2014-03-26 DIAGNOSIS — E1140 Type 2 diabetes mellitus with diabetic neuropathy, unspecified: Secondary | ICD-10-CM

## 2014-03-26 DIAGNOSIS — I10 Essential (primary) hypertension: Secondary | ICD-10-CM

## 2014-03-26 DIAGNOSIS — E1149 Type 2 diabetes mellitus with other diabetic neurological complication: Secondary | ICD-10-CM

## 2014-03-26 LAB — POCT GLYCOSYLATED HEMOGLOBIN (HGB A1C): Hemoglobin A1C: 11.2

## 2014-03-26 MED ORDER — GLIPIZIDE 5 MG PO TABS: 5.0000 mg | ORAL_TABLET | Freq: Two times a day (BID) | ORAL | Status: DC

## 2014-03-26 NOTE — Assessment & Plan Note (Signed)
Continue current HTN medication. Today showed significant elevation in BP however repeat check was only mildly elevated.  - I have ordered a Home Health check. This will check patient's baseline BP at home appropriately. Also I would like them to assess patient's ability to properly take is home medications. This has 2 major purposes: 1) to ensure patient is able to properly administer his medications, and 2) patient may need to be placed on insulin for DM control in the future--if noncompliance is an issue it need to know now.

## 2014-03-26 NOTE — Progress Notes (Signed)
   HPI  Patient presents today for Diabetes check and HTN.  Patient states that he can no longer afford taking glyburide due to insurance coverage. States he's been feeling well. No further complaints. Denies n/v/d/c, fever, chills, SOB, CP, dizziness, polyuria, or dysuria.   Patient would like new medication for DM. Patient was noted to have significant bump in HTN today. This was rechecked, HOWEVER, it was rechecked after an unplanned delay due to the fire alarm going off.  Smoking status noted ROS: Per HPI  Objective: BP 199/102 mmHg  Pulse 108  Temp(Src) 97.9 F (36.6 C) (Oral)  Ht 6\' 5"  (1.956 m)  Wt 236 lb (107.049 kg)  BMI 27.98 kg/m2  Recheck BP: 162/90 (manual)  Gen: NAD, alert, cooperative with exam; patient is blind HEENT: NCAT, MMM CV: RRR, good S1/S2, no murmur Resp: CTABL, no wheezes, non-labored Ext: No edema, warm Neuro: Alert and oriented, No gross deficits other than vision  Assessment and plan:  Hypertension Continue current HTN medication. Today showed significant elevation in BP however repeat check was only mildly elevated.  - I have ordered a Home Health check. This will check patient's baseline BP at home appropriately. Also I would like them to assess patient's ability to properly take is home medications. This has 2 major purposes: 1) to ensure patient is able to properly administer his medications, and 2) patient may need to be placed on insulin for DM control in the future--if noncompliance is an issue it need to know now.   DM (diabetes mellitus), type 2 with neurological complications A1c 11.2 today. Patient no longer capable of taking glyburide due to insurance changes.  - Home Health ordered for medication assessment and VS check - DC glyburide; Start Glipizide 2.5mg  BID for one week, then 5mg  BID after.     Orders Placed This Encounter  Procedures  . Ambulatory referral to Home Health    Referral Priority:  Routine    Referral Type:   Home Health Care    Referral Reason:  Specialty Services Required    Requested Specialty:  Home Health Services    Number of Visits Requested:  1  . POCT HgB A1C    Meds ordered this encounter  Medications  . glipiZIDE (GLUCOTROL) 5 MG tablet    Sig: Take 1 tablet (5 mg total) by mouth 2 (two) times daily before a meal. Start with 1/2 tablet twice a day for the first week.    Dispense:  60 tablet    Refill:  2     Kathee DeltonIan D Cyera Balboni, MD,MS,  PGY1 03/26/2014 7:11 PM

## 2014-03-26 NOTE — Patient Instructions (Addendum)
It was a pleasure seeing you today in our clinic. Today we discussed your diabetes and high blood pressure. Here is the treatment plan we have discussed and agreed upon together:   - I have discontinued your glyburide due to issues with cost. Today I've started you on glipizide. I like you to take one half tablet twice a day for the first week. After the first week you are to take one full tablet twice a day. - Your blood pressure was elevated today in office. I have ordered a visit by home health services to recheck your blood pressure at home as well as checking your routine for taking her medications each day.

## 2014-03-26 NOTE — Assessment & Plan Note (Signed)
A1c 11.2 today. Patient no longer capable of taking glyburide due to insurance changes.  - Home Health ordered for medication assessment and VS check - DC glyburide; Start Glipizide 2.5mg  BID for one week, then 5mg  BID after.

## 2014-04-03 ENCOUNTER — Telehealth: Payer: Self-pay | Admitting: Clinical

## 2014-04-03 NOTE — Telephone Encounter (Signed)
CSW received a call from [place] with [employer] informing CSW that unfortunately [facility] does not [facility] because he works full-time, and is not home bound. CSW informed that pt could benefit from a monitor that would read his blood pressure. CSW was given the contact number to [employer]eta Reid-Hairston the Child psychotherapistsocial worker with [facility] who could potentially assist with obtaining this for pt. CSW has left a message for Velveeta.  Theresia BoughNorma Kou Gucciardo, MSW, LCSW [phone]

## 2014-04-09 ENCOUNTER — Encounter: Payer: Self-pay | Admitting: Family Medicine

## 2014-05-01 ENCOUNTER — Other Ambulatory Visit: Payer: Self-pay | Admitting: Family Medicine

## 2014-06-02 ENCOUNTER — Other Ambulatory Visit: Payer: Self-pay | Admitting: Family Medicine

## 2014-07-30 ENCOUNTER — Encounter: Payer: Self-pay | Admitting: Family Medicine

## 2014-07-30 ENCOUNTER — Ambulatory Visit (INDEPENDENT_AMBULATORY_CARE_PROVIDER_SITE_OTHER): Payer: Medicare Other | Admitting: Family Medicine

## 2014-07-30 VITALS — BP 168/81 | HR 79 | Temp 98.1°F | Ht 77.0 in | Wt 226.0 lb

## 2014-07-30 DIAGNOSIS — E1149 Type 2 diabetes mellitus with other diabetic neurological complication: Secondary | ICD-10-CM

## 2014-07-30 DIAGNOSIS — I10 Essential (primary) hypertension: Secondary | ICD-10-CM | POA: Diagnosis not present

## 2014-07-30 DIAGNOSIS — E1140 Type 2 diabetes mellitus with diabetic neuropathy, unspecified: Secondary | ICD-10-CM

## 2014-07-30 LAB — POCT GLYCOSYLATED HEMOGLOBIN (HGB A1C): Hemoglobin A1C: 11.5

## 2014-07-30 MED ORDER — GLIPIZIDE 10 MG PO TABS: 10.0000 mg | ORAL_TABLET | Freq: Two times a day (BID) | ORAL | Status: DC

## 2014-07-30 NOTE — Patient Instructions (Signed)
It was a pleasure seeing you today in our clinic. Today we discussed your Diabetes and blood pressure. Here is the treatment plan we have discussed and agreed upon together:   - I have increased your Glipizide dose to 10 mg twice a day. For the first week please only take 5 mg in the morning and 10 mg in the afternoon. After the first week take the full 10 mg twice a day. - I will send you some more information via My Chart in the next 24-48 hours.

## 2014-07-31 ENCOUNTER — Encounter: Payer: Self-pay | Admitting: Family Medicine

## 2014-07-31 NOTE — Assessment & Plan Note (Signed)
Initial BP elevated to 168/81. Repeat was 155/80. Patient has been taking meds as prescribed. I asked that he check to make sure his arm BP device is as accurate as the wrist device he brought to the office today, as his wrist BP device was getting BPs within 2+/- of our reads here in the office. If the arm device is accurate then patient may have some slight [place] HTN at play. I will not make any changes to his current treatment regimen if his BPs are normally at the pressures recorded by his arm device.  - Will monitor.

## 2014-07-31 NOTE — Assessment & Plan Note (Signed)
A1c 11.5. Patient continues to be under poor control. Patient does not seem motivated to change his diet and exercise is difficult to encourage due to his visual impairment. Today I have increased his Glipizide to 10mg  BID (w/ a slow taper up to this level over a week). I do not feel confident that this will have much difference on his A1c. I will continuie to tritrate this up to the maximum dosing. Once there we will discuss moving to injectable insulin (if his A1c continues to be unresponsive).

## 2014-07-31 NOTE — Progress Notes (Signed)
   HPI  CC: DM and BP check  Patient has been doing well. Taking medications as prescribed. No hypoglycemic symptoms since our last visit. Has not made any diet changes. No recent changes.  Patient is taking his BP regularly using both his wrist and arm devices he has at home. The arm device sends each reading to his cell phone which he was able to show me. Over the past week patient had the majority of his BPs in the mid-120s/mid-80s. He was inquiring about his elevated BP reading here in the office.  ROS: denies fever, chills, HA, confusion, dizziness, falls, n/v/d/c, CP, SOB, dysuria, frequency, polydipsia.   Review of Systems   See HPI for ROS. All other systems reviewed and are negative.  Past medical history and social history reviewed and updated in the EMR as appropriate.  Objective: BP 168/81 mmHg  Pulse 79  Temp(Src) 98.1 F (36.7 C) (Oral)  Ht 6\' 5"  (1.956 m)  Wt 226 lb (102.513 kg)  BMI 26.79 kg/m2 Gen: NAD, alert, cooperative, and pleasant. HEENT: NCAT, Bilaterally blind. CV: RRR, no murmur Resp: CTAB, no wheezes, non-labored Abd: SNTND, BS present, no guarding or organomegaly Ext: No edema, warm Neuro: Alert and oriented, Speech clear, No gross deficits   Assessment and plan:  Hypertension Initial BP elevated to 168/81. Repeat was 155/80. Patient has been taking meds as prescribed. I asked that he check to make sure his arm BP device is as accurate as the wrist device he brought to the office today, as his wrist BP device was getting BPs within 2+/- of our reads here in the office. If the arm device is accurate then patient may have some slight [place] HTN at play. I will not make any changes to his current treatment regimen if his BPs are normally at the pressures recorded by his arm device.  - Will monitor.  DM (diabetes mellitus), type 2 with neurological complications A1c 11.5. Patient continues to be under poor control. Patient does not seem motivated to  change his diet and exercise is difficult to encourage due to his visual impairment. Today I have increased his Glipizide to 10mg  BID (w/ a slow taper up to this level over a week). I do not feel confident that this will have much difference on his A1c. I will continuie to tritrate this up to the maximum dosing. Once there we will discuss moving to injectable insulin (if his A1c continues to be unresponsive).     Orders Placed This Encounter  Procedures  . POCT HgB A1C    Meds ordered this encounter  Medications  . glipiZIDE (GLUCOTROL) 10 MG tablet    Sig: Take 1 tablet (10 mg total) by mouth 2 (two) times daily before a meal. Take 1 tab (10 mg) in AM, and 1/2 tab (5 mg) in PM for the 1st week.    Dispense:  180 tablet    Refill:  3     Kathee Delton, MD,MS,  PGY1 07/31/2014 12:53 PM

## 2014-09-28 ENCOUNTER — Emergency Department (HOSPITAL_COMMUNITY)
Admission: EM | Admit: 2014-09-28 | Discharge: 2014-09-28 | Disposition: A | Discharge status: Home / Self Care | Payer: Medicare Other | Source: Home / Self Care | Attending: Family Medicine | Admitting: Family Medicine

## 2014-09-28 ENCOUNTER — Encounter (HOSPITAL_COMMUNITY): Payer: Self-pay | Admitting: Emergency Medicine

## 2014-09-28 DIAGNOSIS — R05 Cough: Secondary | ICD-10-CM

## 2014-09-28 DIAGNOSIS — R059 Cough, unspecified: Secondary | ICD-10-CM

## 2014-09-28 DIAGNOSIS — J988 Other specified respiratory disorders: Secondary | ICD-10-CM

## 2014-09-28 MED ORDER — AMOXICILLIN 500 MG PO CAPS: 500.0000 mg | ORAL_CAPSULE | Freq: Two times a day (BID) | ORAL | Status: DC

## 2014-09-28 MED ORDER — HYDROCODONE-HOMATROPINE 5-1.5 MG/5ML PO SYRP: 5.0000 mL | ORAL_SOLUTION | Freq: Four times a day (QID) | ORAL | Status: DC | PRN

## 2014-09-28 NOTE — ED Notes (Signed)
C/o cough for week States he has a headache, runny nose, sneezing and sore throat due to coughing  Tylenol and mucinex was taking as tx

## 2014-09-28 NOTE — Discharge Instructions (Signed)
Cough, Adult  A cough is a reflex that helps clear your throat and airways. It can help heal the body or may be a reaction to an irritated airway. A cough may only last 2 or 3 weeks (acute) or may last more than 8 weeks (chronic).  CAUSES Acute cough:  Viral or bacterial infections. Chronic cough:  Infections.  Allergies.  Asthma.  Post-nasal drip.  Smoking.  Heartburn or acid reflux.  Some medicines.  Chronic lung problems (COPD).  Cancer. SYMPTOMS   Cough.  Fever.  Chest pain.  Increased breathing rate.  High-pitched whistling sound when breathing (wheezing).  Colored mucus that you cough up (sputum). TREATMENT   A bacterial cough may be treated with antibiotic medicine.  A viral cough must run its course and will not respond to antibiotics.  Your caregiver may recommend other treatments if you have a chronic cough. HOME CARE INSTRUCTIONS   Only take over-the-counter or prescription medicines for pain, discomfort, or fever as directed by your caregiver. Use cough suppressants only as directed by your caregiver.  Use a cold steam vaporizer or humidifier in your bedroom or home to help loosen secretions.  Sleep in a semi-upright position if your cough is worse at night.  Rest as needed.  Stop smoking if you smoke. SEEK IMMEDIATE MEDICAL CARE IF:   You have pus in your sputum.  Your cough starts to worsen.  You cannot control your cough with suppressants and are losing sleep.  You begin coughing up blood.  You have difficulty breathing.  You develop pain which is getting worse or is uncontrolled with medicine.  You have a fever. MAKE SURE YOU:   Understand these instructions.  Will watch your condition.  Will get help right away if you are not doing well or get worse. Document Released: 08/01/2010 Document Revised: 04/27/2011 Document Reviewed: 08/01/2010 ExitCare Patient Information 2015 [employer]. This information is not intended  to replace advice given to you by your health care provider. Make sure you discuss any questions you have with your health care provider.  

## 2014-09-28 NOTE — ED Provider Notes (Signed)
CSN: [id number]     Arrival date & time 09/28/14  1310 History   First MD Initiated Contact with Patient 09/28/14 1408     Chief Complaint  Patient presents with  . Cough   (Consider location/radiation/quality/duration/timing/severity/associated sxs/prior Treatment) HPI Comments: [name] is a very pleasant 64 yo black male who presents with a cough and sore throat x 7 days. He has been taking OTC remedies without improvement. He is now feeling some malaise and subjective fever. His cough is non-productive and he feels the throat pain is from the cough. He also has rhinorrhea and post nasal drip.   The history is provided by the patient.    Past Medical History  Diagnosis Date  . Allergy   . Hyperlipidemia   . Hypertension   . Blindness - both eyes 1967    Basketball injury  . Marfan syndrome   . Diabetes mellitus   . Hernia    Past Surgical History  Procedure Laterality Date  . Appendectomy    . Inguinal hernia repair      Right 1970s,    Left 2011 by Dr Andrey Campanile  . Rotator cuff repair  2009    Left side, Dr Elita Quick   . Penile prosthesis implant  2000s    [facility], Dr Brunilda Payor  . Inguinal hernia repair  08/25/10    recurrent left  . Rotator cuff repair  2010-2011?    right side  . Retinal detachment surgery  1970   Family History  Problem Relation Age of Onset  . Heart disease Mother   . Breast cancer Sister    Social History  Substance Use Topics  . Smoking status: Former Smoker    Quit date: 02/14/1984  . Smokeless tobacco: Never Used  . Alcohol Use: No     Comment: Quit in 10/1983    Review of Systems  Constitutional: Positive for fever and fatigue.  HENT: Positive for postnasal drip, rhinorrhea, sneezing and sore throat.   Eyes: Negative.   Respiratory: Positive for cough. Negative for shortness of breath and wheezing.   Cardiovascular: Negative.   Skin: Negative.     Allergies  Review of patient's allergies indicates no known allergies.  Home  Medications   Prior to Admission medications   Medication Sig Start Date End Date Taking? Authorizing Provider  amoxicillin (AMOXIL) 500 MG capsule Take 1 capsule (500 mg total) by mouth 2 (two) times daily. 09/28/14   Riki Sheer, PA-C  augmented betamethasone dipropionate (DIPROLENE-AF) 0.05 % cream Apply two times daily 05/01/14   Kathee Delton, MD  enalapril (VASOTEC) 20 MG tablet Take 1 tablet by mouth  daily 06/04/14   Kathee Delton, MD  fluticasone [facility]) 50 MCG/ACT nasal spray Use 2 sprays nasally daily 03/01/13   Garnetta Buddy, MD  glipiZIDE (GLUCOTROL) 10 MG tablet Take 1 tablet (10 mg total) by mouth 2 (two) times daily before a meal. Take 1 tab (10 mg) in AM, and 1/2 tab (5 mg) in PM for the 1st week. 07/30/14   Kathee Delton, MD  hydrochlorothiazide (HYDRODIURIL) 25 MG tablet Take 1 tablet (25 mg total) by mouth daily. 03/01/13   Garnetta Buddy, MD  HYDROcodone-homatropine [facility]) 5-1.5 MG/5ML syrup Take 5 mLs by mouth every 6 (six) hours as needed for cough. 09/28/14   Riki Sheer, PA-C  hydrOXYzine (ATARAX/VISTARIL) 25 MG tablet  08/04/12   Historical Provider, MD  metoprolol succinate (TOPROL-XL) 25 MG 24 hr tablet Take  1 tablet by mouth  daily    Garnetta Buddy, MD  montelukast (SINGULAIR) 10 MG tablet Take 1 tablet by mouth at  bedtime 06/04/14   Kathee Delton, MD  simvastatin (ZOCOR) 40 MG tablet Take 1 tablet (40 mg total) by mouth at bedtime. 12/01/13   Kathee Delton, MD  TRADJENTA 5 MG TABS tablet Take 1 tablet by mouth  daily 05/01/14   Kathee Delton, MD  triamcinolone cream (KENALOG) 0.1 % Apply to affected area(s)  two times daily 02/26/14   Kathee Delton, MD   BP 165/80 mmHg  Pulse 64  Temp(Src) 97.8 F (36.6 C) (Oral)  Resp 16  SpO2 100% Physical Exam  Constitutional: He is oriented to person, place, and time. He appears well-developed and well-nourished. No distress.  Patient in blind  HENT:  Head: Normocephalic and atraumatic.  Mild oro pharynx  injection. No exudate  Neck: Normal range of motion. Neck supple.  Cardiovascular: Normal rate and regular rhythm.   Pulmonary/Chest: Effort normal.  Mild rhonchi in the bases. No wheeze or crackle  Lymphadenopathy:    He has no cervical adenopathy.  Neurological: He is alert and oriented to person, place, and time.  Skin: Skin is warm and dry. No rash noted. He is not diaphoretic.  Psychiatric: His behavior is normal.  Nursing note and vitals reviewed.   ED Course  Procedures (including critical care time) Labs Review Labs Reviewed - No data to display  Imaging Review No results found.   MDM   1. Respiratory infection   2. Cough    Given duration of symptoms and co-morbidities will empirically treat with Amox and cough suppressant, rest and hydration.    Riki Sheer, PA-C 09/28/14 1425

## 2014-10-18 ENCOUNTER — Other Ambulatory Visit: Payer: Self-pay | Admitting: Family Medicine

## 2014-12-04 ENCOUNTER — Telehealth: Payer: Self-pay | Admitting: *Deleted

## 2014-12-04 ENCOUNTER — Encounter: Payer: Self-pay | Admitting: Family Medicine

## 2014-12-04 ENCOUNTER — Ambulatory Visit (INDEPENDENT_AMBULATORY_CARE_PROVIDER_SITE_OTHER): Payer: Medicare Other | Admitting: Family Medicine

## 2014-12-04 VITALS — BP 148/61 | HR 85 | Temp 97.9°F | Ht 77.0 in | Wt 226.0 lb

## 2014-12-04 DIAGNOSIS — E1149 Type 2 diabetes mellitus with other diabetic neurological complication: Secondary | ICD-10-CM | POA: Diagnosis not present

## 2014-12-04 DIAGNOSIS — Q8740 Marfan's syndrome, unspecified: Secondary | ICD-10-CM | POA: Diagnosis not present

## 2014-12-04 LAB — POCT GLYCOSYLATED HEMOGLOBIN (HGB A1C): Hemoglobin A1C: 12.2

## 2014-12-04 MED ORDER — GLIPIZIDE 10 MG PO TABS: 10.0000 mg | ORAL_TABLET | Freq: Three times a day (TID) | ORAL | Status: DC

## 2014-12-04 NOTE — Patient Instructions (Signed)
It was a pleasure seeing you today in our clinic. Today we discussed your diabetes. Here is the treatment plan we have discussed and agreed upon together:   - I have set up an appt to have an echocardiogram performed. (December 12, 2014 at 1:00pm) - We have increased your glipizide to 30mg  a day (1 tablet 3 times a day) - I would like to see you in 3 months, if your A1c has not dramatically improved then we will likely have to transition to injectable insulin for your diabetes.

## 2014-12-04 NOTE — Assessment & Plan Note (Signed)
Patient currently unable to monitor his own CBGs. A1c today is slightly worse than prior. We discussed this at length. I informed him that at this time my recommendation would be to transition to injectable insulin. Patient stated that he would like to wait until our next visit. If at that time he is unable to lower his A1c then he would be willing to make the transition to injectable insulin. - Order for audio glucometer placed with appropriate supplies. - Follow-up in 3 months

## 2014-12-04 NOTE — Telephone Encounter (Signed)
Patient in clinic today.  PCP informed nurse that patient need a glucometer that speak due to blindness.  Called patient's mail order pharmacy [facility]; per Pharmacist the only product listed for the Prodigy Voice meter kit.  [facility] [address] [facility] inquire if [facility] was in [facility].  Per pharmacist they would check to see if they could order it.  If patient had to pay out of pocket it would be &[phone].  A prior authorization was started with [facility] for the Prodigy meter.  Will check back with [facility] in the morning.  PA is pending.  Patient is aware of prior authorization was started and pending.  Derl Barrow, RN

## 2014-12-04 NOTE — Progress Notes (Signed)
   HPI  CC: Diabetes check Patient is here for a diabetes check. He states that he is been out his proper glucose monitoring test strips for the past few months. Because of this he is not been able to check his glucoses but he has not had any symptoms of hypoglycemia during that span. He does not know what his glucoses have been but he states that he is been compliant with his medications. Today he is asking for a prescription to have a new glucose monitor and adequate test strips due to the fact that his insurance will no longer cover for the monitor he has had in the past.  ROS: Patient denies any headaches or lightheadedness dizziness sweats palpitations shortness of breath chest pain nausea vomiting diarrhea abdominal pain dysuria weakness numbness.  Objective: BP 148/61 mmHg  Pulse 85  Temp(Src) 97.9 F (36.6 C) (Oral)  Ht 6\' 5"  (1.956 m)  Wt 226 lb (102.513 kg)  BMI 26.79 kg/m2 Gen: NAD, alert, cooperative, and pleasant. HEENT: NCAT, EOMI, PERRL CV: RRR, no murmur Resp: CTAB, no wheezes, non-labored Ext: Mild edema, warm, symmetrical pulses. Neuro: Alert and oriented, Speech clear, No new gross deficits  Assessment and plan:  DM (diabetes mellitus), type 2 with neurological complications Patient currently unable to monitor his own CBGs. A1c today is slightly worse than prior. We discussed this at length. I informed him that at this time my recommendation would be to transition to injectable insulin. Patient stated that he would like to wait until our next visit. If at that time he is unable to lower his A1c then he would be willing to make the transition to injectable insulin. - Order for audio glucometer placed with appropriate supplies. - Follow-up in 3 months  Marfan's syndrome Patient with a history of Marfan syndrome and bilateral blindness secondary to retinal detachment from this condition. Our records show no prior assessment of aortic diameter. - Echocardiogram to  assess for aortic dilation.    Orders Placed This Encounter  Procedures  . HgB A1c  . Echocardiogram    Aortic diameter - Patient w/ Marfan Syndrome    Standing Status: Future     Number of Occurrences:      Standing Expiration Date: 03/05/2016    Order Specific Question:  Where should this test be performed    Answer:  [facility]    Order Specific Question:  Complete or Limited study?    Answer:  Complete    Order Specific Question:  With Image Enhancing Agent or without Image Enhancing Agent?    Answer:  With Image Enhancing Agent    Order Specific Question:  Reason for exam-Echo    Answer:  Other - See Comments Section    Meds ordered this encounter  Medications  . glipiZIDE (GLUCOTROL) 10 MG tablet    Sig: Take 1 tablet (10 mg total) by mouth 3 (three) times daily before meals.    Dispense:  180 tablet    Refill:  3     Kathee DeltonIan D Maurine Mowbray, MD,MS,  PGY2 12/04/2014 6:01 PM

## 2014-12-04 NOTE — Assessment & Plan Note (Signed)
Patient with a history of Marfan syndrome and bilateral blindness secondary to retinal detachment from this condition. Our records show no prior assessment of aortic diameter. - Echocardiogram to assess for aortic dilation.

## 2014-12-06 NOTE — Telephone Encounter (Signed)
PA for the Prodigy Voice Blood glucose meter kit was approved via [facility] until 02/16/16 under Medicare Part B.  Standardized DM form completed for the Prodigy Voice meter kit; form signed by Dr. Andria Frames.  [facility] [address]. Voice message left informing patient that PA was approved for the meter kit.   Derl Barrow, RN

## 2014-12-06 NOTE — Telephone Encounter (Signed)
Standardized DM form completed for the test strips and lancets.  Form signed by Dr. Leveda AnnaHensel.  Clovis PuMartin, Asbury Hair L, RN

## 2014-12-06 NOTE — Telephone Encounter (Signed)
[facility] [address] calling, pt needs the strips for the meter. The rx has to be for just the strips, must be specific to directions and how often the patient is testing per medicare.

## 2014-12-07 ENCOUNTER — Telehealth: Payer: Self-pay | Admitting: Family Medicine

## 2014-12-07 NOTE — Telephone Encounter (Signed)
[facility] because the insurance is paying for the meter and the lancets, but needs another prior authorization for the strips. They will not cover this until we do a prior authorization. jw

## 2014-12-11 NOTE — Telephone Encounter (Signed)
PA was approved for Prodigy test strips via [facility] until 02/16/16.  Reference number: [id number].  [facility] is aware of the [facility].  Clovis PuMartin, Tamika L, RN

## 2014-12-12 ENCOUNTER — Ambulatory Visit (HOSPITAL_COMMUNITY): Payer: Medicare Other | Attending: Cardiology

## 2014-12-12 ENCOUNTER — Other Ambulatory Visit: Payer: Self-pay

## 2014-12-12 DIAGNOSIS — I371 Nonrheumatic pulmonary valve insufficiency: Secondary | ICD-10-CM | POA: Diagnosis not present

## 2014-12-12 DIAGNOSIS — I10 Essential (primary) hypertension: Secondary | ICD-10-CM | POA: Diagnosis not present

## 2014-12-12 DIAGNOSIS — Q8740 Marfan's syndrome, unspecified: Secondary | ICD-10-CM | POA: Diagnosis not present

## 2014-12-12 DIAGNOSIS — I059 Rheumatic mitral valve disease, unspecified: Secondary | ICD-10-CM | POA: Diagnosis not present

## 2014-12-12 DIAGNOSIS — I071 Rheumatic tricuspid insufficiency: Secondary | ICD-10-CM | POA: Insufficient documentation

## 2014-12-12 DIAGNOSIS — I351 Nonrheumatic aortic (valve) insufficiency: Secondary | ICD-10-CM | POA: Insufficient documentation

## 2014-12-12 DIAGNOSIS — E119 Type 2 diabetes mellitus without complications: Secondary | ICD-10-CM | POA: Diagnosis not present

## 2014-12-12 DIAGNOSIS — I517 Cardiomegaly: Secondary | ICD-10-CM | POA: Diagnosis not present

## 2015-01-14 ENCOUNTER — Other Ambulatory Visit: Payer: Self-pay | Admitting: Family Medicine

## 2015-03-07 ENCOUNTER — Telehealth: Payer: Self-pay | Admitting: *Deleted

## 2015-03-07 NOTE — Telephone Encounter (Signed)
Received faxed request from [facility] (ph [phone] fax [phone]) for Prodigy Auto Kit Monitor Glucometer not listed on patient current or historical med list.  Form placed in Dr. Eyvonne Mechanic box for review. Velora Heckler, RN

## 2015-03-13 NOTE — Telephone Encounter (Signed)
Paperwork filled out

## 2015-04-12 ENCOUNTER — Ambulatory Visit: Payer: Medicare Other | Admitting: Family Medicine

## 2015-04-16 ENCOUNTER — Telehealth: Payer: Self-pay | Admitting: *Deleted

## 2015-04-16 NOTE — Telephone Encounter (Signed)
Called patient to offer flu vaccine. Patient states he received flu vaccine where he works [employer]) at the beginning of October 2016 Added to historical immunization record. Fredderick Severance, RN

## 2015-04-24 ENCOUNTER — Other Ambulatory Visit: Payer: Self-pay | Admitting: Family Medicine

## 2015-05-17 ENCOUNTER — Encounter: Payer: Self-pay | Admitting: Family Medicine

## 2015-05-17 ENCOUNTER — Ambulatory Visit (INDEPENDENT_AMBULATORY_CARE_PROVIDER_SITE_OTHER): Payer: Medicare Other | Admitting: Family Medicine

## 2015-05-17 VITALS — BP 154/73 | HR 90 | Temp 97.7°F | Ht 77.0 in | Wt 225.7 lb

## 2015-05-17 DIAGNOSIS — E1149 Type 2 diabetes mellitus with other diabetic neurological complication: Secondary | ICD-10-CM

## 2015-05-17 DIAGNOSIS — E1110 Type 2 diabetes mellitus with ketoacidosis without coma: Secondary | ICD-10-CM

## 2015-05-17 DIAGNOSIS — E1310 Other specified diabetes mellitus with ketoacidosis without coma: Secondary | ICD-10-CM

## 2015-05-17 DIAGNOSIS — E785 Hyperlipidemia, unspecified: Secondary | ICD-10-CM

## 2015-05-17 DIAGNOSIS — I10 Essential (primary) hypertension: Secondary | ICD-10-CM

## 2015-05-17 LAB — CBC
HCT: 39.1 % (ref 39.0–52.0)
Hemoglobin: 12.4 g/dL — ABNORMAL LOW (ref 13.0–17.0)
MCH: 28.8 pg (ref 26.0–34.0)
MCHC: 31.7 g/dL (ref 30.0–36.0)
MCV: 90.7 fL (ref 78.0–100.0)
MPV: 9.9 fL (ref 8.6–12.4)
Platelets: 279 10*3/uL (ref 150–400)
RBC: 4.31 MIL/uL (ref 4.22–5.81)
RDW: 12 % (ref 11.5–15.5)
WBC: 5.5 10*3/uL (ref 4.0–10.5)

## 2015-05-17 LAB — POCT GLYCOSYLATED HEMOGLOBIN (HGB A1C): Hemoglobin A1C: 10.7

## 2015-05-17 MED ORDER — METOPROLOL SUCCINATE ER 25 MG PO TB24: 12.5000 mg | ORAL_TABLET | Freq: Every day | ORAL | Status: DC

## 2015-05-17 NOTE — Assessment & Plan Note (Signed)
Stable: Patient endorses good compliance with his statin medication. - We'll assess with no lipid panel today

## 2015-05-17 NOTE — Assessment & Plan Note (Signed)
Worsening: Patient's blood pressure is slightly elevated today with a systolic of 154. Heart rate is in the 90s. Patient had previously been placed on metoprolol and was subsequently taken off for reasons unknown. However, patient states that he tolerated this medication well and "as long as it doesn't hurt my kidneys" he is okay with taking it. - Initiated metoprolol succinate 12.5 mg daily.

## 2015-05-17 NOTE — Progress Notes (Signed)
   HPI  CC: Glucose check. Patient is here for checkup on his diabetes. He states that he has not been able to check his CBGs regularly due to a malfunction with his glucometer. Patient says that he is having a representative of the glucometers manufacture to come out and assess what is wrong (patient is blind and requires a specific glucometer). He endorses excellent compliance with his medications at this time. At our previous meeting we had discussed the possibility that he may need to be transitioned to injectable insulin for his glucose control. He continues to endorse a desire to avoid this if it is at all possible. He denies any issues at this time. He denies any symptoms of hypoglycemia. No chest pain or shortness of breath no nausea no vomiting no diarrhea.  Hyperlipidemia: Patient endorses good compliance with his Zocor. No issues at this time. Lipid panel is not up-to-date, I will obtain this today.  Hypertension: Patient was noted to be slightly hypertensive with a blood pressure of 154/73. He denies any symptoms from these pressures like headache, shortness of breath, chest pain. He endorses good compliance with his current ACE inhibitor but is not on any other antihypertensives at this time.  Review of Systems   See HPI for ROS. All other systems reviewed and are negative.  CC, SH/smoking status, and VS noted  Objective: BP 154/73 mmHg  Pulse 90  Temp(Src) 97.7 F (36.5 C) (Oral)  Ht 6\' 5"  (1.956 m)  Wt 225 lb 11.2 oz (102.377 kg)  BMI 26.76 kg/m2 Gen: NAD, alert, cooperative, and pleasant. Blind with seeing-eye dog at side HEENT: NCAT, MMM, no LAD, no JVD CV: RRR, no murmur Resp: CTAB, no wheezes, non-labored Ext: No edema, warm, see charted diabetic foot exam Neuro: Alert and oriented, Speech clear, No gross deficits from baseline  Assessment and plan:  DM (diabetes mellitus), type 2 with neurological complications Improved: A1c today 10.7 which is lower than the  previous 12.2. Patient endorses a lack of CBG checks at home due to a glucometer malfunction. He states that this will be resolved sometime next week. I discussed the possibility of injectable insulin and patient again asked to avoid this at all costs. Diabetic foot exam was relatively unremarkable with the exception of reduced sensation bilaterally at the distal and lateral aspect with the left worse than the right. - No changes to medications at this time.  Hyperlipidemia Stable: Patient endorses good compliance with his statin medication. - We'll assess with no lipid panel today  Hypertension Worsening: Patient's blood pressure is slightly elevated today with a systolic of 154. Heart rate is in the 90s. Patient had previously been placed on metoprolol and was subsequently taken off for reasons unknown. However, patient states that he tolerated this medication well and "as long as it doesn't hurt my kidneys" he is okay with taking it. - Initiated metoprolol succinate 12.5 mg daily.    Orders Placed This Encounter  Procedures  . COMPLETE METABOLIC PANEL WITH GFR  . Lipid panel  . CBC  . TSH  . POCT glycosylated hemoglobin (Hb A1C)    Meds ordered this encounter  Medications  . metoprolol succinate (TOPROL-XL) 25 MG 24 hr tablet    Sig: Take 0.5 tablets (12.5 mg total) by mouth daily.    Dispense:  45 tablet    Refill:  3     Kathee DeltonIan D McKeag, MD,MS,  PGY2 05/17/2015 6:22 PM

## 2015-05-17 NOTE — Patient Instructions (Addendum)
It was a pleasure seeing you today in our clinic. Today we discussed your diabetes. Here is the treatment plan we have discussed and agreed upon together:   - I have added a new medication to your medication regimen. This medication is called metoprolol. Take a half tablet once a day. - I have obtained some labs today. I will mail you the results of these or call you personally if there needs to be any changes to your medications based on these results. - I would like to see her back in 3 months.

## 2015-05-17 NOTE — Assessment & Plan Note (Signed)
Improved: A1c today 10.7 which is lower than the previous 12.2. Patient endorses a lack of CBG checks at home due to a glucometer malfunction. He states that this will be resolved sometime next week. I discussed the possibility of injectable insulin and patient again asked to avoid this at all costs. Diabetic foot exam was relatively unremarkable with the exception of reduced sensation bilaterally at the distal and lateral aspect with the left worse than the right. - No changes to medications at this time.

## 2015-05-18 LAB — LIPID PANEL
Cholesterol: 162 mg/dL (ref 125–200)
HDL: 68 mg/dL (ref >=40)
LDL Cholesterol: 75 mg/dL (ref <130)
Total CHOL/HDL Ratio: 2.4 Ratio (ref <=5.0)
Triglycerides: 96 mg/dL (ref <150)
VLDL: 19 mg/dL (ref <30)

## 2015-05-18 LAB — COMPLETE METABOLIC PANEL WITH GFR
ALBUMIN: 4.1 g/dL (ref 3.6–5.1)
ALT: 15 U/L (ref 9–46)
AST: 11 U/L (ref 10–35)
Alkaline Phosphatase: 69 U/L (ref 40–115)
BILIRUBIN TOTAL: 0.8 mg/dL (ref 0.2–1.2)
BUN: 29 mg/dL — ABNORMAL HIGH (ref 7–25)
CO2: 29 mmol/L (ref 20–31)
Calcium: 9.7 mg/dL (ref 8.6–10.3)
Chloride: 104 mmol/L (ref 98–110)
Creat: 1.45 mg/dL — ABNORMAL HIGH (ref 0.70–1.25)
GFR, Est African American: 58 mL/min — ABNORMAL LOW (ref >=60)
GFR, Est Non African American: 50 mL/min — ABNORMAL LOW (ref >=60)
GLUCOSE: 368 mg/dL — AB (ref 65–99)
Potassium: 4.3 mmol/L (ref 3.5–5.3)
SODIUM: 139 mmol/L (ref 135–146)
TOTAL PROTEIN: 7.2 g/dL (ref 6.1–8.1)

## 2015-05-18 LAB — TSH: TSH: 0.69 mIU/L (ref 0.40–4.50)

## 2015-05-30 ENCOUNTER — Encounter: Payer: Self-pay | Admitting: Family Medicine

## 2015-06-15 ENCOUNTER — Other Ambulatory Visit: Payer: Self-pay | Admitting: Family Medicine

## 2015-08-19 ENCOUNTER — Ambulatory Visit (INDEPENDENT_AMBULATORY_CARE_PROVIDER_SITE_OTHER): Payer: Medicare Other | Admitting: Family Medicine

## 2015-08-19 ENCOUNTER — Encounter: Payer: Self-pay | Admitting: Family Medicine

## 2015-08-19 VITALS — BP 160/76 | HR 71 | Temp 97.7°F | Ht 77.0 in | Wt 226.0 lb

## 2015-08-19 DIAGNOSIS — I10 Essential (primary) hypertension: Secondary | ICD-10-CM | POA: Diagnosis not present

## 2015-08-19 DIAGNOSIS — E1149 Type 2 diabetes mellitus with other diabetic neurological complication: Secondary | ICD-10-CM | POA: Diagnosis not present

## 2015-08-19 LAB — POCT GLYCOSYLATED HEMOGLOBIN (HGB A1C): HEMOGLOBIN A1C: 10.4

## 2015-08-19 MED ORDER — ENALAPRIL MALEATE 20 MG PO TABS: 20.0000 mg | ORAL_TABLET | Freq: Every day | ORAL | Status: DC

## 2015-08-19 MED ORDER — EMPAGLIFLOZIN 10 MG PO TABS: 10.0000 mg | ORAL_TABLET | Freq: Every day | ORAL | Status: DC

## 2015-08-19 NOTE — Assessment & Plan Note (Signed)
Patient's blood pressure was elevated today. Repeat blood pressure was significantly improved. After discussion about this he stated that his trip into the clinic today was very stressful and he just needed a moment to calm down. - We'll monitor.

## 2015-08-19 NOTE — Patient Instructions (Signed)
Empagliflozin oral tablets What is this medicine? EMPAGLIGLOZIN (EM pa gli FLOE zin) helps to treat type 2 diabetes. It helps to control blood sugar. Treatment is combined with diet and exercise. This medicine may be used for other purposes; ask your health care provider or pharmacist if you have questions. What should I tell my health care provider before I take this medicine? They need to know if you have any of these conditions: -dehydration -diabetic ketoacidosis -diet low in salt -eating less due to illness, surgery, dieting, or any other reason -having surgery -high cholesterol -high levels of potassium in the blood -history of pancreatitis or pancreas problems -history of yeast infection of the penis or vagina -if you often drink alcohol -infections in the bladder, kidneys, or urinary tract -kidney disease -liver disease -low blood pressure -on hemodialysis -problems urinating -type 1 diabetes -uncircumcised male -an unusual or allergic reaction to empagliflozin, other medicines, foods, dyes, or preservatives  How should I use this medicine? Take this medicine by mouth with a glass of water. Follow the directions on the prescription label. Take it in the morning, with or without food. Take your dose at the same time each day. Do not take more often than directed. Do not stop taking except on your doctor's advice. Talk to your pediatrician regarding the use of this medicine in children. Special care may be needed. Overdosage: If you think you have taken too much of this medicine contact a poison control center or emergency room at once. NOTE: This medicine is only for you. Do not share this medicine with others. What if I miss a dose? If you miss a dose, take it as soon as you can. If it is almost time for your next dose, take only that dose. Do not take double or extra doses. What may interact with this medicine? Do not take this medicine with any of the following  medications: -gatifloxacin This medicine may also interact with the following medications: -alcohol -certain medicines for blood pressure, heart disease (Not your Enalapril) -diuretics This list may not describe all possible interactions. Give your health care provider a list of all the medicines, herbs, non-prescription drugs, or dietary supplements you use. Also tell them if you smoke, drink alcohol, or use illegal drugs. Some items may interact with your medicine. What should I watch for while using this medicine? Visit your doctor or health care professional for regular checks on your progress. This medicine can cause a serious condition in which there is too much acid in the blood. If you develop nausea, vomiting, stomach pain, unusual tiredness, or breathing problems, stop taking this medicine and call your doctor right away. If possible, use a ketone dipstick to check for ketones in your urine. A test called the HbA1C (A1C) will be monitored. This is a simple blood test. It measures your blood sugar control over the last 2 to 3 months. You will receive this test every 3 to 6 months. Learn how to check your blood sugar. Learn the symptoms of low and high blood sugar and how to manage them. Always carry a quick-source of sugar with you in case you have symptoms of low blood sugar. Examples include hard sugar candy or glucose tablets. Make sure others know that you can choke if you eat or drink when you develop serious symptoms of low blood sugar, such as seizures or unconsciousness. They must get medical help at once. Tell your doctor or health care professional if you have high blood  sugar. You might need to change the dose of your medicine. If you are sick or exercising more than usual, you might need to change the dose of your medicine. Do not skip meals. Ask your doctor or health care professional if you should avoid alcohol. Many nonprescription cough and cold products contain sugar or  alcohol. These can affect blood sugar. Wear a medical ID bracelet or chain, and carry a card that describes your disease and details of your medicine and dosage times. What side effects may I notice from receiving this medicine? Side effects that you should report to your doctor or health care professional as soon as possible: -allergic reactions like skin rash, itching or hives, swelling of the face, lips, or tongue -breathing problems -dizziness -fast or irregular heartbeat -feeling faint or lightheaded, falls -muscle weakness -nausea, vomiting, unusual stomach upset or pain -signs and symptoms of low blood sugar such as feeling anxious, confusion, dizziness, increased hunger, unusually weak or tired, sweating, shakiness, cold, irritable, headache, blurred vision, fast heartbeat, loss of consciousness -signs and symptoms of a urinary tract infection, such as fever, chills, a burning feeling when urinating, blood in the urine, back pain -trouble passing urine or change in the amount of urine, including an urgent need to urinate more often, in larger amounts, or at night -penile discharge, itching, or pain in men -unusual tiredness -vaginal discharge, itching, or odor in women Side effects that usually do not require medical attention (Report these to your doctor or health care professional if they continue or are bothersome.): -joint pain -mild increase in urination -thirsty This list may not describe all possible side effects. Call your doctor for medical advice about side effects. You may report side effects to FDA at 1-800-FDA-1088. Where should I keep my medicine? Keep out of the reach of children. Store at room temperature between 20 and 25 degrees C (68 and 77 degrees F). Throw away any unused medicine after the expiration date. NOTE: This sheet is a summary. It may not cover all possible information. If you have questions about this medicine, talk to your doctor, pharmacist, or  health care provider.    2016, [employer]vier/[employer] Standard. (2014-01-23 11:37:10)

## 2015-08-19 NOTE — Progress Notes (Signed)
   HPI  CC: Diabetes check Patient has been compliant with his medications at this time. He continues to try to improve his A1c with managing his diet and adequate compliance with his medications. He denies any symptoms of hypoglycemia. He states that he feels well and has been relatively pleased with how he has been doing. He still has some issues with his glucometer as he occasionally has difficulty with the proper settings (patient is blind and requires a glucometer with a speaking ability). Patient denies any dizziness, lightheadedness, nausea, vomiting, polyuria, polydipsia, weakness, numbness or paresthesias.   Review of Systems   See HPI for ROS. All other systems reviewed and are negative.  CC, SH/smoking status, and VS noted  Objective: BP 160/76 mmHg  Pulse 71  Temp(Src) 97.7 F (36.5 C) (Oral)  Ht 6\' 5"  (1.956 m)  Wt 226 lb (102.513 kg)  BMI 26.79 kg/m2 Gen: NAD, alert, cooperative, and pleasant. Blind with seeing-eye dog at side HEENT: NCAT, MMM, no LAD, no JVD CV: RRR, no murmur Resp: CTAB, no wheezes, non-labored Ext: No edema, warm, see charted diabetic foot exam Neuro: Alert and oriented, Speech clear, No gross deficits from baseline  Assessment and plan:  DM (diabetes mellitus), type 2 with neurological complications Stable/improved: Patient has been working hard to get his A1c down. Today's A1c was 10.4 down from 10.7. Patient's renal function continues to be an issue. At this time I believe it is appropriate to increase his glycemic medications. Unfortunately due to his underlying blindness injectable insulin will likely be a dangerous addition to his medications. - Initiating Jardiance 10 mg daily - We'll follow-up with BMP at next visit  Hypertension Patient's blood pressure was elevated today. Repeat blood pressure was significantly improved. After discussion about this he stated that his trip into the clinic today was very stressful and he just needed a  moment to calm down. - We'll monitor.    Orders Placed This Encounter  Procedures  . HgB A1c    Meds ordered this encounter  Medications  . empagliflozin (JARDIANCE) 10 MG TABS tablet    Sig: Take 10 mg by mouth daily.    Dispense:  30 tablet    Refill:  5  . enalapril (VASOTEC) 20 MG tablet    Sig: Take 1 tablet (20 mg total) by mouth daily.    Dispense:  90 tablet    Refill:  3     Kathee DeltonIan D Hermie Reagor, MD,MS,  PGY3 08/19/2015 7:30 PM

## 2015-08-19 NOTE — Assessment & Plan Note (Signed)
Stable/improved: Patient has been working hard to get his A1c down. Today's A1c was 10.4 down from 10.7. Patient's renal function continues to be an issue. At this time I believe it is appropriate to increase his glycemic medications. Unfortunately due to his underlying blindness injectable insulin will likely be a dangerous addition to his medications. - Initiating Jardiance 10 mg daily - We'll follow-up with BMP at next visit

## 2015-10-21 ENCOUNTER — Other Ambulatory Visit: Payer: Self-pay | Admitting: Family Medicine

## 2015-10-23 ENCOUNTER — Other Ambulatory Visit: Payer: Self-pay | Admitting: *Deleted

## 2015-10-23 MED ORDER — BETAMETHASONE DIPROPIONATE AUG 0.05 % EX CREA: TOPICAL_CREAM | CUTANEOUS | 2 refills | Status: DC

## 2015-11-25 ENCOUNTER — Telehealth: Payer: Self-pay | Admitting: *Deleted

## 2015-11-25 NOTE — Telephone Encounter (Signed)
Pt returned call. Does not to make appt now for wellness visit

## 2015-11-25 NOTE — Telephone Encounter (Signed)
Called patient to offer to schedule Annual Wellness Visit. Message left on patient's home VM to return call. Mobile phone answered but only heard static. Fredderick SeveranceUCATTE, LAURENZE L, RN

## 2015-12-03 ENCOUNTER — Other Ambulatory Visit: Payer: Self-pay | Admitting: Family Medicine

## 2015-12-09 ENCOUNTER — Other Ambulatory Visit: Payer: Self-pay | Admitting: Family Medicine

## 2016-01-13 ENCOUNTER — Ambulatory Visit (INDEPENDENT_AMBULATORY_CARE_PROVIDER_SITE_OTHER): Payer: Medicare Other | Admitting: Family Medicine

## 2016-01-13 VITALS — BP 155/74 | HR 74 | Temp 98.1°F | Ht 77.0 in | Wt 224.0 lb

## 2016-01-13 DIAGNOSIS — Z23 Encounter for immunization: Secondary | ICD-10-CM

## 2016-01-13 DIAGNOSIS — E1149 Type 2 diabetes mellitus with other diabetic neurological complication: Secondary | ICD-10-CM

## 2016-01-13 LAB — POCT GLYCOSYLATED HEMOGLOBIN (HGB A1C): Hemoglobin A1C: 7.9

## 2016-01-13 MED ORDER — ZOSTER VACCINE LIVE 19400 UNT/0.65ML ~~LOC~~ SUSR: 0.6500 mL | Freq: Once | SUBCUTANEOUS | 0 refills | Status: AC

## 2016-01-13 NOTE — Progress Notes (Signed)
   HPI  CC: Diabetes check Patient is here for diabetes check. He states that he has been compliant with all of his medications. He denies any symptoms of hypoglycemia. He denies any weakness, numbness, headache, dizziness, nausea, vomiting, dysuria, or polyuria. He states that he feels overall quite well and was very happy to hear about his A1c results.  Review of Systems    See HPI for ROS. All other systems reviewed and are negative.  CC, SH/smoking status, and VS noted  Objective: BP (!) 155/74   Pulse 74   Temp 98.1 F (36.7 C) (Oral)   Ht 6\' 5"  (1.956 m)   Wt 224 lb (101.6 kg)   BMI 26.56 kg/m  Gen: NAD, alert, cooperative, and pleasant. HEENT: NCAT, eyes sclerotic/patient is blind, no LAD, MMM CV: RRR, no murmur Resp: CTAB, no wheezes, non-labored Ext: No edema, warm Neuro: Alert and oriented, Speech clear, No gross deficits  Assessment and plan:  DM (diabetes mellitus), type 2 with neurological complications Improved: Patient's diabetes seems to be dramatically improved today. A1c was 7.9 from 10.4 at our last visit. Patient endorses good compliance with his medications and no reported side effects at this time. He is very happy with these results. - Continue current medication regimen. - I would not push glycemic control beyond the 6.0-7.0 range as patient's blindness makes any hypoglycemic events of very worrisome possibility.   Orders Placed This Encounter  Procedures  . POCT HgB A1C (CPT 83036)    Meds ordered this encounter  Medications  . Zoster Vaccine Live, PF, (ZOSTAVAX) [id number] UNT/0.65ML injection    Sig: Inject 19,400 Units into the skin once.    Dispense:  1 each    Refill:  0     Kathee DeltonIan D McKeag, MD,MS,  PGY3 01/13/2016 1:47 PM

## 2016-01-13 NOTE — Addendum Note (Signed)
Addended by: Garen GramsBENTON, Shondrika Hoque F on: 01/13/2016 05:06 PM   Modules accepted: Orders

## 2016-01-13 NOTE — Patient Instructions (Addendum)
It was a pleasure seeing you today in our clinic. Today we discussed your diabetes. Here is the treatment plan we have discussed and agreed upon together:   - I am very pleased with how your A1c is trending. Continue the good work by watching your diet and staying compliant with all of your medications. - No medication changes are necessary at this time. - Your blood pressure was mildly elevated today. However, it was not elevated enough to the point where we need to make any changes with your blood pressure medications. Just make sure to stay compliant with these as well. - I have sent a order for you to receive the "Zostavax" vaccine to the [facility] on [address]. Please go to receive this vaccine sometime between now and the next couple months. - I would like to see you back in 3-6 months.

## 2016-01-13 NOTE — Assessment & Plan Note (Signed)
Improved: Patient's diabetes seems to be dramatically improved today. A1c was 7.9 from 10.4 at our last visit. Patient endorses good compliance with his medications and no reported side effects at this time. He is very happy with these results. - Continue current medication regimen. - I would not push glycemic control beyond the 6.0-7.0 range as patient's blindness makes any hypoglycemic events of very worrisome possibility.

## 2016-01-26 ENCOUNTER — Other Ambulatory Visit: Payer: Self-pay | Admitting: Family Medicine

## 2016-03-03 ENCOUNTER — Telehealth: Payer: Self-pay | Admitting: *Deleted

## 2016-03-03 NOTE — Telephone Encounter (Signed)
Prior Authorization received from [facility] for Prodigy No code Strips. PA completed online at www.[email or web address]. PA is pending. Clovis PuMartin, Avalynn Bowe L, RN

## 2016-03-03 NOTE — Telephone Encounter (Signed)
PA approved for Prodigy No Coding Test strips until 02/15/17.  Reference number: [id number]. Clovis PuMartin, Oluwaferanmi Wain L, RN

## 2016-04-20 ENCOUNTER — Other Ambulatory Visit: Payer: Self-pay | Admitting: Family Medicine

## 2016-05-11 ENCOUNTER — Other Ambulatory Visit: Payer: Self-pay | Admitting: Family Medicine

## 2016-06-25 ENCOUNTER — Other Ambulatory Visit: Payer: Self-pay | Admitting: Family Medicine

## 2016-06-30 ENCOUNTER — Ambulatory Visit (INDEPENDENT_AMBULATORY_CARE_PROVIDER_SITE_OTHER): Payer: Medicare Other | Admitting: Family Medicine

## 2016-06-30 ENCOUNTER — Encounter: Payer: Self-pay | Admitting: Family Medicine

## 2016-06-30 VITALS — BP 136/78 | HR 78 | Temp 98.2°F | Ht 77.0 in | Wt 226.6 lb

## 2016-06-30 DIAGNOSIS — E1149 Type 2 diabetes mellitus with other diabetic neurological complication: Secondary | ICD-10-CM

## 2016-06-30 LAB — POCT GLYCOSYLATED HEMOGLOBIN (HGB A1C): HEMOGLOBIN A1C: 7.5

## 2016-06-30 NOTE — Assessment & Plan Note (Signed)
Improved: Patient's A1c is improved from previous. Patient is very happy with his current medication regimen. He has no desire to change medications at this time. No signs or symptoms of hypoglycemic events. - Continue current regimen.

## 2016-06-30 NOTE — Progress Notes (Signed)
   HPI  CC: Diabetes follow-up Patient is here for his diabetes follow-up. He states he's been doing well and has been compliant with all of his medications at this time. He denies any signs or symptoms of hypoglycemia. He states his CBG ranges between 100 and 200. No additional concerns at this time. Patient states that he is very comfortable with his current medication regimen.   ROS: Denies headache, weakness, dizziness, vertigo, shortness of breath, chest pain, nausea, vomiting, diarrhea, numbness, or paresthesias.  CC, SH/smoking status, and VS noted  Objective: BP 136/78   Pulse 78   Temp 98.2 F (36.8 C) (Oral)   Ht 6\' 5"  (1.956 m)   Wt 226 lb 9.6 oz (102.8 kg)   SpO2 98%   BMI 26.87 kg/m  Gen: NAD, alert, cooperative, and pleasant. CV: RRR, no murmur Resp: CTAB, no wheezes, non-labored Ext: No edema, peripheral pulses intact and equal bilaterally, gait normal   Assessment and plan:  DM (diabetes mellitus), type 2 with neurological complications Improved: Patient's A1c is improved from previous. Patient is very happy with his current medication regimen. He has no desire to change medications at this time. No signs or symptoms of hypoglycemic events. - Continue current regimen.   Orders Placed This Encounter  Procedures  . HgB A1c    Kathee DeltonIan D Erienne Spelman, MD,MS,  PGY3 06/30/2016 1:35 PM

## 2016-07-25 ENCOUNTER — Other Ambulatory Visit: Payer: Self-pay | Admitting: Family Medicine

## 2016-07-27 NOTE — Telephone Encounter (Signed)
Left message on patient's home and mobile VM requesting return call to discuss refill request from [facility] for new glucometer. Calling to ensure patient needs new glucometer versus supplies such as strips and lancets. Chad Hall. [place], RN, BSN

## 2016-07-27 NOTE — Telephone Encounter (Signed)
Patient returned call states he needs test strips and lancets only. Kinnie FeilL. Ducatte, RN, BSN

## 2016-07-28 ENCOUNTER — Other Ambulatory Visit: Payer: Self-pay | Admitting: Family Medicine

## 2016-07-28 MED ORDER — GLUCOSE BLOOD VI STRP: 1.0000 | ORAL_STRIP | Freq: Three times a day (TID) | 99 refills | Status: DC

## 2016-07-28 MED ORDER — PRODIGY LANCETS 28G MISC: 1.0000 | Freq: Three times a day (TID) | 11 refills | Status: DC

## 2016-07-28 NOTE — Telephone Encounter (Signed)
Test strips and lancets ordered.

## 2016-10-20 ENCOUNTER — Other Ambulatory Visit: Payer: Self-pay | Admitting: Family Medicine

## 2016-11-17 ENCOUNTER — Other Ambulatory Visit: Payer: Self-pay | Admitting: Family Medicine

## 2016-12-07 ENCOUNTER — Other Ambulatory Visit: Payer: Self-pay | Admitting: Family Medicine

## 2017-01-06 ENCOUNTER — Encounter: Payer: Self-pay | Admitting: Family Medicine

## 2017-01-06 ENCOUNTER — Ambulatory Visit: Payer: Medicare Other | Admitting: Family Medicine

## 2017-01-06 ENCOUNTER — Other Ambulatory Visit: Payer: Self-pay

## 2017-01-06 VITALS — BP 164/82 | HR 78 | Temp 97.5°F | Wt 227.0 lb

## 2017-01-06 DIAGNOSIS — E1149 Type 2 diabetes mellitus with other diabetic neurological complication: Secondary | ICD-10-CM

## 2017-01-06 DIAGNOSIS — L209 Atopic dermatitis, unspecified: Secondary | ICD-10-CM

## 2017-01-06 DIAGNOSIS — H547 Unspecified visual loss: Secondary | ICD-10-CM | POA: Diagnosis not present

## 2017-01-06 DIAGNOSIS — E785 Hyperlipidemia, unspecified: Secondary | ICD-10-CM | POA: Diagnosis not present

## 2017-01-06 DIAGNOSIS — I10 Essential (primary) hypertension: Secondary | ICD-10-CM

## 2017-01-06 LAB — POCT GLYCOSYLATED HEMOGLOBIN (HGB A1C): HEMOGLOBIN A1C: 7.1

## 2017-01-06 MED ORDER — GLIPIZIDE 10 MG PO TABS: ORAL_TABLET | ORAL | 3 refills | Status: DC

## 2017-01-06 MED ORDER — ENALAPRIL MALEATE 20 MG PO TABS: 20.0000 mg | ORAL_TABLET | Freq: Every day | ORAL | 3 refills | Status: DC

## 2017-01-06 MED ORDER — SIMVASTATIN 40 MG PO TABS: 40.0000 mg | ORAL_TABLET | Freq: Every day | ORAL | 3 refills | Status: DC

## 2017-01-06 MED ORDER — LINAGLIPTIN 5 MG PO TABS: 5.0000 mg | ORAL_TABLET | Freq: Every day | ORAL | 3 refills | Status: DC

## 2017-01-06 MED ORDER — EMPAGLIFLOZIN 10 MG PO TABS: 10.0000 mg | ORAL_TABLET | Freq: Every day | ORAL | 3 refills | Status: DC

## 2017-01-06 MED ORDER — BETAMETHASONE DIPROPIONATE AUG 0.05 % EX CREA: TOPICAL_CREAM | CUTANEOUS | 1 refills | Status: DC

## 2017-01-06 MED ORDER — TRIAMCINOLONE ACETONIDE 0.1 % EX CREA: TOPICAL_CREAM | CUTANEOUS | 2 refills | Status: DC

## 2017-01-06 MED ORDER — METOPROLOL SUCCINATE ER 25 MG PO TB24: 12.5000 mg | ORAL_TABLET | Freq: Every day | ORAL | 3 refills | Status: DC

## 2017-01-06 NOTE — Assessment & Plan Note (Signed)
Lipid panel today

## 2017-01-06 NOTE — Assessment & Plan Note (Signed)
Refilled Tradjenta 5mg , Jardianc 10mg , and Glipizide 10mg . No changes needed as HbA1c is below goal at 7.1.  No foot ulcers. Educated patient on importance of routine foot exams.

## 2017-01-06 NOTE — Progress Notes (Signed)
Subjective: Chief Complaint  Patient presents with  . Diabetes     HPI: Chad Hall is a 66 y.o. presenting to clinic today to discuss the following:  1 Regular check up for DM 2 Regular check up for HTN 3 Lipid panel  Chad Hall is a 66y/o male with PMH of blindness, HTN, T2DM, Hyperlipidemia, and Marfan syndrome. He has no complaints today and states he is coming in for a regular check up.   He denies chest pain, palpitations, SOB, abdominal pain, nausea, vomiting, constipation, or diarrhea.  Health Maintenance: Colonoscopy was done by Dr. [facility]; told no polyps and return in 5 years, Foot exam today.     ROS noted in HPI.   Past Medical, Surgical, Social, and Family History Reviewed & Updated per EMR.   Pertinent Historical Findings include:   Social History   Tobacco Use  Smoking Status Former Smoker  . Last attempt to quit: 02/14/1984  . Years since quitting: 32.9  Smokeless Tobacco Never Used      Objective: BP (!) 164/82   Pulse 78   Temp (!) 97.5 F (36.4 C) (Oral)   Wt 227 lb (103 kg)   SpO2 99%   BMI 26.92 kg/m  Vitals and nursing notes reviewed  Physical Exam Gen: Alert and Oriented x 3, NAD HEENT: Normocephalic, atraumatic, no pupilary response to light, TM visible with good light reflex, non-swollen, non-erythematous turbinates, non-erythematous pharyngeal mucosa, no exudates Neck: trachea midline, no thyroidmegaly, no LAD CV: RRR, no murmurs, normal S1, S2 split, faint pulses dorsalis pedis bilaterally, no JVD Resp: CTAB, no wheezing, rales, or rhonchi, comfortable work of breathing Abd: non-distended, non-tender, soft, +bs in all four quadrants, no hepatosplenomegaly MSK: FROM in all four extremities Ext: no clubbing, cyanosis, or edema Skin: warm, dry, intact,  Psych: appropriate behavior, mood, denies any suicidal ideation Foot Exam:loss of sensation on the plantar 3rd toe on the right, loss of sensation on the 5th  toe and below the 5th toe on the left  Results for orders placed or performed in visit on 01/06/17 (from the past 72 hour(s))  HgB A1c     Status: None   Collection Time: 01/06/17  1:44 PM  Result Value Ref Range   Hemoglobin A1C 7.1     Assessment/Plan:  Hypertension BP today was elevated in office today but patient reports he checks it at home and it is typically in the 120-130s systolic and 70-80s diastolic. No changes to meds today as he most likely has some white coat hypertension. Continue to monitor. Patient agrees to continue to check his BP at home.  Getting CMP to monitor kidneys b/c he is on ACEI.  DM (diabetes mellitus), type 2 with neurological complications Refilled Tradjenta 5mg , Jardianc 10mg , and Glipizide 10mg . No changes needed as HbA1c is below goal at 7.1.  No foot ulcers. Educated patient on importance of routine foot exams.  Atopic dermatitis Refilled creams for patient as he has chronic exzcema. Informed patient that if he has not seen any resolution in symptoms with use of the creams to return to the office for further workup.   Hyperlipidemia Lipid panel today.    PATIENT EDUCATION PROVIDED: See AVS    Diagnosis and plan along with any newly prescribed medication(s) were discussed in detail with this patient today. The patient verbalized understanding and agreed with the plan. Patient advised if symptoms worsen return to clinic or ER.   Health Maintainance:  Orders Placed This Encounter  Procedures  . HgB A1c    Meds ordered this encounter  Medications  . enalapril (VASOTEC) 20 MG tablet    Sig: Take 1 tablet (20 mg total) by mouth daily.    Dispense:  90 tablet    Refill:  3  . glipiZIDE (GLUCOTROL) 10 MG tablet    Sig: TAKE 1 TABLET BY MOUTH 3  TIMES DAILY BEFORE MEALS    Dispense:  270 tablet    Refill:  3  . empagliflozin (JARDIANCE) 10 MG TABS tablet    Sig: Take 10 mg by mouth daily.    Dispense:  90 tablet    Refill:  3  .  metoprolol succinate (TOPROL-XL) 25 MG 24 hr tablet    Sig: Take 0.5 tablets (12.5 mg total) by mouth daily.    Dispense:  45 tablet    Refill:  3  . simvastatin (ZOCOR) 40 MG tablet    Sig: Take 1 tablet (40 mg total) by mouth at bedtime.    Dispense:  90 tablet    Refill:  3  . augmented betamethasone dipropionate (DIPROLENE-AF) 0.05 % cream    Sig: APPLY AS DIRECTED TWO TIMES DAILY    Dispense:  100 g    Refill:  1  . triamcinolone cream (KENALOG) 0.1 %    Sig: Apply to affected area(s)  two times daily    Dispense:  454 g    Refill:  2  . linagliptin (TRADJENTA) 5 MG TABS tablet    Sig: Take 1 tablet (5 mg total) by mouth daily.    Dispense:  90 tablet    Refill:  3     Chad Karen ChafeLockamy, DO 01/06/2017, 1:38 PM PGY-1, [facility]

## 2017-01-06 NOTE — Assessment & Plan Note (Signed)
BP today was elevated in office today but patient reports he checks it at home and it is typically in the 120-130s systolic and 70-80s diastolic. No changes to meds today as he most likely has some white coat hypertension. Continue to monitor. Patient agrees to continue to check his BP at home.  Getting CMP to monitor kidneys b/c he is on ACEI.

## 2017-01-06 NOTE — Assessment & Plan Note (Signed)
Refilled creams for patient as he has chronic exzcema. Informed patient that if he has not seen any resolution in symptoms with use of the creams to return to the office for further workup.

## 2017-01-06 NOTE — Patient Instructions (Addendum)
It was great to meet you today! Thank you for letting me participate in your care!  Today, we discussed your blood pressure it is well controlled at home and I will not make any changes today. If it goes over 140 systolic at home or 90 diastolic at home please come in to be seen and evaluated.  I refilled your medications for your Type 2 Diabetes. Please take them as prescribed. We also got several labs looking at your blood sugar and kidney function. If your labs are abnormal I will contact you.  I also refilled your topical steroid creams for your atopic dermatitis. If it does not improve with use of these creams please return to the office for evaluation.  Be well, Jules Schickim Yalda Herd, DO PGY-1, [facility]

## 2017-01-07 LAB — COMPREHENSIVE METABOLIC PANEL
A/G RATIO: 1.7 (ref 1.2–2.2)
ALK PHOS: 70 IU/L (ref 39–117)
ALT: 21 IU/L (ref 0–44)
AST: 18 IU/L (ref 0–40)
Albumin: 4.3 g/dL (ref 3.6–4.8)
BUN/Creatinine Ratio: 17 (ref 10–24)
BUN: 24 mg/dL (ref 8–27)
Bilirubin Total: 0.5 mg/dL (ref 0.0–1.2)
CALCIUM: 9.6 mg/dL (ref 8.6–10.2)
CHLORIDE: 107 mmol/L — AB (ref 96–106)
CO2: 25 mmol/L (ref 20–29)
Creatinine, Ser: 1.38 mg/dL — ABNORMAL HIGH (ref 0.76–1.27)
GFR calc Af Amer: 60 mL/min/{1.73_m2} (ref >59)
GFR, EST NON AFRICAN AMERICAN: 52 mL/min/{1.73_m2} — AB (ref >59)
Globulin, Total: 2.6 g/dL (ref 1.5–4.5)
Glucose: 149 mg/dL — ABNORMAL HIGH (ref 65–99)
POTASSIUM: 4.6 mmol/L (ref 3.5–5.2)
Sodium: 146 mmol/L — ABNORMAL HIGH (ref 134–144)
Total Protein: 6.9 g/dL (ref 6.0–8.5)

## 2017-01-07 LAB — LIPID PANEL
CHOL/HDL RATIO: 2.3 ratio (ref 0.0–5.0)
Cholesterol, Total: 151 mg/dL (ref 100–199)
HDL: 67 mg/dL (ref >39)
LDL CALC: 71 mg/dL (ref 0–99)
TRIGLYCERIDES: 66 mg/dL (ref 0–149)
VLDL CHOLESTEROL CAL: 13 mg/dL (ref 5–40)

## 2017-04-11 ENCOUNTER — Other Ambulatory Visit: Payer: Self-pay | Admitting: Family Medicine

## 2017-06-18 ENCOUNTER — Other Ambulatory Visit: Payer: Self-pay

## 2017-06-18 ENCOUNTER — Ambulatory Visit: Payer: Medicare Other | Admitting: Family Medicine

## 2017-06-18 ENCOUNTER — Encounter: Payer: Self-pay | Admitting: Family Medicine

## 2017-06-18 VITALS — BP 122/80 | HR 70 | Temp 98.4°F | Ht 77.0 in | Wt 230.0 lb

## 2017-06-18 DIAGNOSIS — Z0000 Encounter for general adult medical examination without abnormal findings: Secondary | ICD-10-CM

## 2017-06-18 DIAGNOSIS — E1149 Type 2 diabetes mellitus with other diabetic neurological complication: Secondary | ICD-10-CM

## 2017-06-18 DIAGNOSIS — Z23 Encounter for immunization: Secondary | ICD-10-CM

## 2017-06-18 DIAGNOSIS — I10 Essential (primary) hypertension: Secondary | ICD-10-CM

## 2017-06-18 LAB — POCT GLYCOSYLATED HEMOGLOBIN (HGB A1C): HEMOGLOBIN A1C: 7.5

## 2017-06-18 NOTE — Progress Notes (Signed)
Subjective: Chief Complaint  Patient presents with  . Diabetes    no complaints voiced     HPI: Chad Hall is a 67 y.o. presenting to clinic today to discuss the following:  Regular Wellness Visit No complaints and has no issues. Has all his medications and knows how to take them. No refills needed today.  DM Management Patient is taking medication. HgbA1c today. Patient states he can afford his medications but that they are expensive.  Denies headache, chest pain, SOB, difficulty breathing, abdominal pain, nausea, vomiting, difficulty urination, inability to void, diarrhea, constipation, or blood in the stool  Health Maintenance: had colonoscopy via [facility] with Dr. Carman Ching on 09/09/16, received PNA vaccine, performed diabetic foot exam today    ROS noted in HPI.   Past Medical, Surgical, Social, and Family History Reviewed & Updated per EMR.   Pertinent Historical Findings include:   Social History   Tobacco Use  Smoking Status Former Smoker  . Last attempt to quit: 02/14/1984  . Years since quitting: 33.3  Smokeless Tobacco Never Used    Objective: BP 122/80 (BP Location: Right Arm, Patient Position: Sitting, Cuff Size: Normal)   Pulse 70   Temp 98.4 F (36.9 C) (Oral)   Ht  (1.956 m)   Wt 230 lb (104.3 kg)   SpO2 98%   BMI 27.27 kg/m  Vitals and nursing notes reviewed  Physical Exam  Constitutional: He is oriented to person, place, and time. He appears well-developed and well-nourished. No distress.  HENT:  Head: Normocephalic and atraumatic.  Eyes:  Bilateral blindness  Neck: Normal range of motion. Neck supple. No thyromegaly present.  Cardiovascular: Normal rate, regular rhythm, normal heart sounds and intact distal pulses.  No murmur heard. No carotid bruits  Pulmonary/Chest: Effort normal and breath sounds normal. No respiratory distress. He has no wheezes. He has no rales.  Abdominal: Soft. Bowel sounds are normal. He  exhibits no distension. There is no tenderness. There is no guarding.  Musculoskeletal: Normal range of motion. He exhibits no edema.       Right foot: There is no deformity.       Left foot: There is no deformity.  Feet:  Right Foot:  Protective Sensation: 10 sites tested. 4 sites sensed.  Skin Integrity: Positive for callus and dry skin. Negative for ulcer, blister, skin breakdown or warmth.  Left Foot:  Protective Sensation: 10 sites tested. 3 sites sensed.  Skin Integrity: Positive for callus and dry skin. Negative for ulcer, blister, skin breakdown or warmth.  Neurological: He is alert and oriented to person, place, and time.  Skin: Skin is warm and dry. Capillary refill takes 2 to 3 seconds. No erythema.        Results for orders placed or performed in visit on 06/18/17 (from the past 72 hour(s))  HgB A1c     Status: None   Collection Time: 06/18/17  9:56 AM  Result Value Ref Range   Hemoglobin A1C 7.5     Assessment/Plan:  Hypertension BP is well controlled at office visit today 122/80. No changes to medications today. Patient states he has all his meds at home and needs no refills at this time.  Healthcare maintenance Received PNA vaccine today.  DM (diabetes mellitus), type 2 with neurological complications Patient with HgbA1c of 7.5 today but still below goal of 8.0 for him. Will continue to monitor every 3 months.  Patient is complaint on Tradjenta and Jardiance.  PATIENT EDUCATION PROVIDED: See AVS    Diagnosis and plan along with any newly prescribed medication(s) were discussed in detail with this patient today. The patient verbalized understanding and agreed with the plan. Patient advised if symptoms worsen return to clinic or ER.   Health Maintainance:   Orders Placed This Encounter  Procedures  . HgB A1c    No orders of the defined types were placed in this encounter.  Jules Schick, DO 06/22/2017, 8:53 AM PGY-1, [facility]

## 2017-06-18 NOTE — Patient Instructions (Signed)
It was great to see you today! Thank you for letting me participate in your care!  Today, we discussed your diabetes and your HgbA1c is at 7.5. I would like to see it under 7 but for you under 8 is still good. Please continue taking your medications as prescribed and try to avoid sugary foods and drinks.  You received your PNA vaccine today.   Please return in 3 months so we can continue monitoring your diabetes.  Be well, Jules Schick, DO PGY-1, [facility]

## 2017-06-22 NOTE — Assessment & Plan Note (Signed)
BP is well controlled at office visit today 122/80. No changes to medications today. Patient states he has all his meds at home and needs no refills at this time.

## 2017-06-22 NOTE — Assessment & Plan Note (Signed)
Patient with HgbA1c of 7.5 today but still below goal of 8.0 for him. Will continue to monitor every 3 months.  Patient is complaint on Tradjenta and Jardiance.

## 2017-06-22 NOTE — Assessment & Plan Note (Signed)
Received PNA vaccine today. 

## 2017-07-18 ENCOUNTER — Other Ambulatory Visit: Payer: Self-pay | Admitting: Family Medicine

## 2017-07-20 ENCOUNTER — Other Ambulatory Visit: Payer: Self-pay

## 2017-07-20 MED ORDER — GLUCOSE BLOOD VI STRP: ORAL_STRIP | 3 refills | Status: DC

## 2017-07-20 MED ORDER — PRODIGY LANCETS 28G MISC: 1.0000 | Freq: Three times a day (TID) | 3 refills | Status: DC

## 2017-09-19 ENCOUNTER — Other Ambulatory Visit: Payer: Self-pay | Admitting: Family Medicine

## 2017-10-04 ENCOUNTER — Other Ambulatory Visit: Payer: Self-pay

## 2017-10-04 ENCOUNTER — Ambulatory Visit: Payer: Medicare Other | Admitting: Family Medicine

## 2017-10-04 ENCOUNTER — Encounter: Payer: Self-pay | Admitting: Family Medicine

## 2017-10-04 VITALS — BP 137/70 | HR 69 | Temp 97.6°F | Ht >= 80 in | Wt 223.8 lb

## 2017-10-04 DIAGNOSIS — M25561 Pain in right knee: Secondary | ICD-10-CM

## 2017-10-04 DIAGNOSIS — E785 Hyperlipidemia, unspecified: Secondary | ICD-10-CM | POA: Diagnosis not present

## 2017-10-04 DIAGNOSIS — I10 Essential (primary) hypertension: Secondary | ICD-10-CM | POA: Diagnosis not present

## 2017-10-04 DIAGNOSIS — E1149 Type 2 diabetes mellitus with other diabetic neurological complication: Secondary | ICD-10-CM | POA: Diagnosis not present

## 2017-10-04 HISTORY — DX: Pain in right knee: M25.561

## 2017-10-04 LAB — POCT GLYCOSYLATED HEMOGLOBIN (HGB A1C): HBA1C, POC (CONTROLLED DIABETIC RANGE): 6.2 % (ref 0.0–7.0)

## 2017-10-04 MED ORDER — SIMVASTATIN 40 MG PO TABS: 40.0000 mg | ORAL_TABLET | Freq: Every day | ORAL | 3 refills | Status: DC

## 2017-10-04 MED ORDER — ENALAPRIL MALEATE 20 MG PO TABS: 20.0000 mg | ORAL_TABLET | Freq: Every day | ORAL | 3 refills | Status: DC

## 2017-10-04 MED ORDER — LINAGLIPTIN 5 MG PO TABS: 5.0000 mg | ORAL_TABLET | Freq: Every day | ORAL | 3 refills | Status: DC

## 2017-10-04 MED ORDER — GLIPIZIDE 10 MG PO TABS: ORAL_TABLET | ORAL | 3 refills | Status: DC

## 2017-10-04 MED ORDER — METOPROLOL SUCCINATE ER 25 MG PO TB24: 12.5000 mg | ORAL_TABLET | Freq: Every day | ORAL | 3 refills | Status: DC

## 2017-10-04 MED ORDER — EMPAGLIFLOZIN 10 MG PO TABS: 10.0000 mg | ORAL_TABLET | Freq: Every day | ORAL | 3 refills | Status: DC

## 2017-10-04 NOTE — Progress Notes (Signed)
Subjective: Chief Complaint  Patient presents with  . Annual Exam    HPI: Chad Hall is a 67 y.o. presenting to clinic today to discuss the following:  Right Knee Pain Patient states he has pain in his right knee on the medial side. He states it "never really goes away" but if he sits down or lays down the pain is neglige. It is worse when he walks and "some days are just better than others". He takes Tylenol at times and states that helps some. He describes the pain as "an arthritis pain" but he states he has never had x-rays of his knee. He would like to watch and wait at this time.  DM Patient is well controlled on Jardiance, Tradjenta, and Glipizide. A1c today was 6.1. No episodes of hypoglycemia. He is active and still works.   HTN Blood pressure well controlled today on Enalapril. He is taking is enalapril daily and is not having any difficulty obtaining his medications.   HLD Taking simvastatin as prescribed. Refilled medication today.   Health Maintenance: None today     ROS noted in HPI.   Past Medical, Surgical, Social, and Family History Reviewed & Updated per EMR.   Pertinent Historical Findings include:   Social History   Tobacco Use  Smoking Status Former Smoker  . Last attempt to quit: 02/14/1984  . Years since quitting: 33.6  Smokeless Tobacco Never Used    Objective: BP 137/70   Pulse 69   Temp 97.6 F (36.4 C) (Oral)   Ht 6\' 8"  (2.032 m)   Wt 223 lb 12.8 oz (101.5 kg)   SpO2 99%   BMI 24.59 kg/m  Vitals and nursing notes reviewed  Physical Exam Gen: Alert and Oriented x 3, NAD HEENT: Normocephalic, atraumatic, PERRLA, EOMI CV: RRR, no murmurs, normal S1, S2 split Resp: CTAB, no wheezing, rales, or rhonchi, comfortable work of breathing Abd: non-distended, non-tender, soft, +bs in all four quadrants MSK: Moves all four extremities, no pain on palpation of the right knee, no swelling or erythema Ext: no clubbing, cyanosis, or  edema Skin: warm, dry, intact, no rashes  Results for orders placed or performed in visit on 10/04/17 (from the past 72 hour(s))  HgB A1c     Status: None   Collection Time: 10/04/17  8:55 AM  Result Value Ref Range   Hemoglobin A1C     HbA1c POC (<> result, manual entry)     HbA1c, POC (prediabetic range)     HbA1c, POC (controlled diabetic range) 6.2 0.0 - 7.0 %    Assessment/Plan:  Hypertension Well controlled at today's visit as it was below 140 systolic and below 90 diastolic on recheck at clinic. Continue enalapril 10mg  daily and metoprolol 12.5mg  daily. Refills sent to online pharmacy today.  DM (diabetes mellitus), type 2 with neurological complications A1c 6.2 today, well controlled. Will continue to monitor and if drops lower I will discontinue glipizide. Considering he has been so well controlled his last 3 visits I will see him in 6 months for his next checkup.  Continue Jardiance 10mg  daily, Tradjenta 5mg  daily, and Glipizide 10mg  TID.  Hyperlipidemia Continue Simvastatin 40mg  daily. Last lipid panel had LDL of 71 which is above goal of less than 70. Recheck lipid panel at next visit. Advised patient to continue with diet and exercise to help bring down LDL.  Right knee pain Patient history and physical exam are all consistent with arthritis pain. No fever, chills,  swelling, or redness make other etiologies such as knee joint infection or gout less likely. It is not limiting his activity at this time and he is able to treat his pain successfully with rest and OTC Tylenol. If his pain continues to worsen he will return to clinic where we will get standing x-rays and then treat with steroid injection if x-rays show arthritis.   PATIENT EDUCATION PROVIDED: See AVS    Diagnosis and plan along with any newly prescribed medication(s) were discussed in detail with this patient today. The patient verbalized understanding and agreed with the plan. Patient advised if symptoms  worsen return to clinic or ER.   Health Maintainance:   Orders Placed This Encounter  Procedures  . HgB A1c    Meds ordered this encounter  Medications  . empagliflozin (JARDIANCE) 10 MG TABS tablet    Sig: Take 10 mg by mouth daily.    Dispense:  90 tablet    Refill:  3  . enalapril (VASOTEC) 20 MG tablet    Sig: Take 1 tablet (20 mg total) by mouth daily.    Dispense:  90 tablet    Refill:  3  . linagliptin (TRADJENTA) 5 MG TABS tablet    Sig: Take 1 tablet (5 mg total) by mouth daily.    Dispense:  90 tablet    Refill:  3  . glipiZIDE (GLUCOTROL) 10 MG tablet    Sig: TAKE 1 TABLET BY MOUTH 3  TIMES DAILY BEFORE MEALS    Dispense:  270 tablet    Refill:  3  . simvastatin (ZOCOR) 40 MG tablet    Sig: Take 1 tablet (40 mg total) by mouth at bedtime.    Dispense:  90 tablet    Refill:  3  . metoprolol succinate (TOPROL-XL) 25 MG 24 hr tablet    Sig: Take 0.5 tablets (12.5 mg total) by mouth daily.    Dispense:  45 tablet    Refill:  3     Tim Karen ChafeLockamy, DO 10/04/2017, 8:58 AM PGY-2 [facility]

## 2017-10-04 NOTE — Assessment & Plan Note (Signed)
A1c 6.2 today, well controlled. Will continue to monitor and if drops lower I will discontinue glipizide. Considering he has been so well controlled his last 3 visits I will see him in 6 months for his next checkup.  Continue Jardiance 10mg  daily, Tradjenta 5mg  daily, and Glipizide 10mg  TID.

## 2017-10-04 NOTE — Assessment & Plan Note (Signed)
Well controlled at today's visit as it was below 140 systolic and below 90 diastolic on recheck at clinic. Continue enalapril 10mg  daily and metoprolol 12.5mg  daily. Refills sent to online pharmacy today.

## 2017-10-04 NOTE — Patient Instructions (Signed)
It was great to see you today! Thank you for letting me participate in your care!  Today, we discussed your blood pressure and your diabetes. Both are very well controlled on your current medications. Please keep taking them as prescribed and continue to be active and eat a balanced diet. You are doing great! I have sent refills to the online pharmacy.  For your right knee pain please return to the clinic if it gets worse and/or starts to limit your daily activities.  Be well, Jules Schickim Jaydalee Bardwell, DO PGY-2, [facility]

## 2017-10-04 NOTE — Assessment & Plan Note (Signed)
Continue Simvastatin 40mg  daily. Last lipid panel had LDL of 71 which is above goal of less than 70. Recheck lipid panel at next visit. Advised patient to continue with diet and exercise to help bring down LDL.

## 2017-10-04 NOTE — Assessment & Plan Note (Signed)
Patient history and physical exam are all consistent with arthritis pain. No fever, chills, swelling, or redness make other etiologies such as knee joint infection or gout less likely. It is not limiting his activity at this time and he is able to treat his pain successfully with rest and OTC Tylenol. If his pain continues to worsen he will return to clinic where we will get standing x-rays and then treat with steroid injection if x-rays show arthritis.

## 2017-12-12 ENCOUNTER — Other Ambulatory Visit: Payer: Self-pay | Admitting: Family Medicine

## 2018-02-05 ENCOUNTER — Other Ambulatory Visit: Payer: Self-pay | Admitting: Family Medicine

## 2018-04-08 ENCOUNTER — Ambulatory Visit: Payer: Medicare Other | Admitting: Family Medicine

## 2018-04-08 VITALS — BP 130/75 | HR 76 | Temp 98.1°F | Wt 228.8 lb

## 2018-04-08 DIAGNOSIS — E1149 Type 2 diabetes mellitus with other diabetic neurological complication: Secondary | ICD-10-CM | POA: Diagnosis not present

## 2018-04-08 DIAGNOSIS — I10 Essential (primary) hypertension: Secondary | ICD-10-CM

## 2018-04-08 LAB — POCT GLYCOSYLATED HEMOGLOBIN (HGB A1C): HbA1c, POC (controlled diabetic range): 6.9 % (ref 0.0–7.0)

## 2018-04-08 MED ORDER — BETAMETHASONE DIPROPIONATE AUG 0.05 % EX CREA: TOPICAL_CREAM | CUTANEOUS | 1 refills | Status: DC

## 2018-04-08 MED ORDER — TRIAMCINOLONE ACETONIDE 0.1 % EX CREA: TOPICAL_CREAM | CUTANEOUS | 1 refills | Status: DC

## 2018-04-08 NOTE — Patient Instructions (Signed)
It was great to see you today! Thank you for letting me participate in your care!  Today, we discussed your diabetes and your high blood pressure both of which are under great control. I have refilled your skin creams to treat your eczema. Keep up the good work!  Be well, Jules Schick, DO PGY-2, [facility]

## 2018-04-08 NOTE — Assessment & Plan Note (Signed)
Well controlled in office today and from home reports. Patient instructed to inform me if BP is consistently over 140 systolic or 90 diastolic. - Cont Enalapril 10mg  daily - Cont Metoprolol 12.5mg  daily

## 2018-04-08 NOTE — Progress Notes (Signed)
     Subjective: Chief Complaint  Patient presents with  . Diabetes    HPI: Chad Hall is a 68 y.o. presenting to clinic today to discuss the following:  T2DM Patient is doing well and has been compliant on medications. No episodes of hypoglycemia, nausea, vomiting, or diarrhea. He states he does not miss doses and feels good. He does not check his blood sugar every morning but does so "most mornings". He states it is never over 200 fasting in the morning.  HTN One reported episode of "high blood pressure" when he went to his Nephrologist appointment. He checks his BP at home every morning and states it is never over 140 systolic and never over 90 diastolic. He denies headache.  ROS noted in HPI.   Past Medical, Surgical, Social, and Family History Reviewed & Updated per EMR.   Pertinent Historical Findings include:   Social History   Tobacco Use  Smoking Status Former Smoker  . Last attempt to quit: 02/14/1984  . Years since quitting: 34.1  Smokeless Tobacco Never Used    Objective: BP 130/75   Pulse 76   Temp 98.1 F (36.7 C)   Wt 228 lb 12.8 oz (103.8 kg)   SpO2 98%   BMI 25.13 kg/m  Vitals and nursing notes reviewed  Physical Exam Gen: Alert and Oriented x 3, NAD HEENT: Normocephalic, atraumatic CV: RRR, no murmurs, normal S1, S2 split Resp: CTAB, no wheezing, rales, or rhonchi, comfortable work of breathing Ext: no clubbing, cyanosis, or edema Skin: warm, dry, intact, no rashes  Results for orders placed or performed in visit on 04/08/18 (from the past 72 hour(s))  HgB A1c     Status: None   Collection Time: 04/08/18  1:50 PM  Result Value Ref Range   Hemoglobin A1C     HbA1c POC (<> result, manual entry)     HbA1c, POC (prediabetic range)     HbA1c, POC (controlled diabetic range) 6.9 0.0 - 7.0 %    Assessment/Plan:  DM (diabetes mellitus), type 2 with neurological complications A1c today 6.9%, well controlled . - Cont Jardiance 10mg   daily - Cont Glipizide 20mg  TID before meals - cont Tradjenta 5mg  daily  Hypertension Well controlled in office today and from home reports. Patient instructed to inform me if BP is consistently over 140 systolic or 90 diastolic. - Cont Enalapril 10mg  daily - Cont Metoprolol 12.5mg  daily    PATIENT EDUCATION PROVIDED: See AVS    Diagnosis and plan along with any newly prescribed medication(s) were discussed in detail with this patient today. The patient verbalized understanding and agreed with the plan. Patient advised if symptoms worsen return to clinic or ER.     Orders Placed This Encounter  Procedures  . HgB A1c    Meds ordered this encounter  Medications  . augmented betamethasone dipropionate (DIPROLENE-AF) 0.05 % cream    Sig: Apply to affected area twice daily as needed.    Dispense:  100 g    Refill:  1  . triamcinolone cream (KENALOG) 0.1 %    Sig: APPLY TO AFFECTED AREA(S)  TOPICALLY TWO TIMES DAILY    Dispense:  400 g    Refill:  1     Tim Karen Chafe, DO 04/08/2018, 1:37 PM PGY-2 [facility]

## 2018-04-08 NOTE — Assessment & Plan Note (Addendum)
A1c today 6.9%, well controlled . - Cont Jardiance 10mg  daily - Cont Glipizide 20mg  TID before meals - cont Tradjenta 5mg  daily

## 2018-04-08 NOTE — Progress Notes (Signed)
1c 

## 2018-05-03 ENCOUNTER — Telehealth: Payer: Self-pay | Admitting: *Deleted

## 2018-05-03 NOTE — Telephone Encounter (Signed)
Attempted to schedule pt in may but the schedule is not up yet. Pt states he will call back and schedule closer to may. Deseree Bruna Potter, CMA

## 2018-05-03 NOTE — Telephone Encounter (Signed)
-----   Message from Arlyce Harman, DO sent at 05/03/2018  3:02 PM EDT ----- Can we contact this patient to ask him to come to clinic sometime in May to talk about adjusting his diabetic medications. Thanks!!

## 2018-07-03 ENCOUNTER — Other Ambulatory Visit: Payer: Self-pay | Admitting: Family Medicine

## 2018-07-20 ENCOUNTER — Other Ambulatory Visit: Payer: Self-pay | Admitting: Family Medicine

## 2018-09-18 ENCOUNTER — Other Ambulatory Visit: Payer: Self-pay | Admitting: Family Medicine

## 2018-09-20 ENCOUNTER — Other Ambulatory Visit: Payer: Self-pay | Admitting: Family Medicine

## 2018-09-21 ENCOUNTER — Other Ambulatory Visit: Payer: Self-pay | Admitting: Family Medicine

## 2018-09-26 ENCOUNTER — Other Ambulatory Visit: Payer: Self-pay

## 2018-09-26 ENCOUNTER — Ambulatory Visit: Payer: Medicare Other | Admitting: Family Medicine

## 2018-09-26 VITALS — BP 126/80 | HR 74

## 2018-09-26 DIAGNOSIS — I10 Essential (primary) hypertension: Secondary | ICD-10-CM

## 2018-09-26 DIAGNOSIS — E1149 Type 2 diabetes mellitus with other diabetic neurological complication: Secondary | ICD-10-CM | POA: Diagnosis not present

## 2018-09-26 DIAGNOSIS — E785 Hyperlipidemia, unspecified: Secondary | ICD-10-CM

## 2018-09-26 LAB — POCT GLYCOSYLATED HEMOGLOBIN (HGB A1C): HbA1c, POC (controlled diabetic range): 6.5 % (ref 0.0–7.0)

## 2018-09-26 MED ORDER — GLIPIZIDE 10 MG PO TABS: ORAL_TABLET | ORAL | 3 refills | Status: DC

## 2018-09-26 NOTE — Progress Notes (Signed)
     Subjective: Chief Complaint  Patient presents with  . Diabetes     HPI: Chad Hall is a 68 y.o. presenting to clinic today to discuss the following:  DM Patient has been taking Glipizide BID since his last refill for the past several months. Denies any hypoglycemic episodes. Has been taking Jardiance and Tradjenta daily. He continues to check his fasting blood glucose in the mornings and states it is has not been over 150 since his last appointment.   HTN Patient states he continues to take enalapril as prescribed and has no cough or other side effects. No throat or tongue swelling, no difficulty breathing or swallowing. His home wrist BP monitor is not calibrated properly as it is giving him falsely elevated readings. His BP was repeated twice and both readings were below goal.   Denies fever, cough, headaches, abdominal pain, nausea, vomiting, diarrhea.  ROS noted in HPI.   Past Medical, Surgical, Social, and Family History Reviewed & Updated per EMR.   Pertinent Historical Findings include:   Social History   Tobacco Use  Smoking Status Former Smoker  . Quit date: 02/14/1984  . Years since quitting: 34.6  Smokeless Tobacco Never Used   Objective: BP 126/80   Pulse 74   SpO2 99%  Vitals and nursing notes reviewed  Physical Exam Gen: Alert and Oriented x 3, NAD HEENT: Normocephalic, atraumatic CV: RRR, no murmurs, normal S1, S2 split Resp: CTAB, no wheezing, rales, or rhonchi, comfortable work of breathing MSK: See DM Foot Exam note Ext: no clubbing, cyanosis, or edema Skin: warm, dry, intact, two calluses on the base of each foot  Results for orders placed or performed in visit on 09/26/18 (from the past 72 hour(s))  HgB A1c     Status: None   Collection Time: 09/26/18  8:52 AM  Result Value Ref Range   Hemoglobin A1C     HbA1c POC (<> result, manual entry)     HbA1c, POC (prediabetic range)     HbA1c, POC (controlled diabetic range) 6.5 0.0 - 7.0  %    Assessment/Plan:  Hyperlipidemia Lipid panel today, non-fasting - Cont simvastatin 40mg   DM (diabetes mellitus), type 2 with neurological complications Well controlled with A1c of 6.5% at today's visit - Cont Jardiance 10mg  daily - Cont Tradjenta 5mg  daily - Decreased Glipizide to 10mg  once daily before night time meal  Hypertension Well controlled at office visit today - Cont Enalapril 20mg  daily - cont Metoprolol 25mg  daily   PATIENT EDUCATION PROVIDED: See AVS    Diagnosis and plan along with any newly prescribed medication(s) were discussed in detail with this patient today. The patient verbalized understanding and agreed with the plan. Patient advised if symptoms worsen return to clinic or ER.   Health Maintainance: Lipid Panel   Orders Placed This Encounter  Procedures  . HgB A1c    Meds ordered this encounter  Medications  . glipiZIDE (GLUCOTROL) 10 MG tablet    Sig: TAKE 1 TABLET BY MOUTH ONCE A DAY BEFORE NIGHT TIME MEAL    Dispense:  180 tablet    Refill:  [place], DO 09/26/2018, 8:41 AM PGY-3 [place]

## 2018-09-26 NOTE — Assessment & Plan Note (Signed)
Lipid panel today, non-fasting - Cont simvastatin 40mg 

## 2018-09-26 NOTE — Assessment & Plan Note (Signed)
Well controlled at office visit today - Cont Enalapril 20mg  daily - cont Metoprolol 25mg  daily

## 2018-09-26 NOTE — Patient Instructions (Signed)
It was great to see you today! Thank you for letting me participate in your care!  Today, we discussed your diabetes and it continues to be very well controlled. I have decreased your Glipizide from twice a day to only once per day at night time. If your diabetes continues to be well controlled we will eventually stop this medication.  Your blood pressure remains under control as well, keep up the good work!  Be well, Harolyn Rutherford, DO PGY-3, [facility]

## 2018-09-26 NOTE — Assessment & Plan Note (Signed)
Well controlled with A1c of 6.5% at today's visit - Cont Jardiance 10mg  daily - Cont Tradjenta 5mg  daily - Decreased Glipizide to 10mg  once daily before night time meal

## 2018-10-03 ENCOUNTER — Other Ambulatory Visit: Payer: Self-pay | Admitting: Family Medicine

## 2018-10-03 ENCOUNTER — Telehealth: Payer: Self-pay | Admitting: Family Medicine

## 2018-10-03 DIAGNOSIS — E1149 Type 2 diabetes mellitus with other diabetic neurological complication: Secondary | ICD-10-CM

## 2018-10-03 NOTE — Progress Notes (Signed)
Patient has developing callous on both of his feet. He is blind and cannot reliably know when they are getting worse and when it may benefit from debridement. I am ordering home wound care for him in order for someone to come and periodically check his feet as he is limited by his blindness and COVID during this time to travel. I think home wound care would be best for this patient at this time.

## 2018-12-14 ENCOUNTER — Other Ambulatory Visit: Payer: Self-pay | Admitting: Family Medicine

## 2018-12-25 ENCOUNTER — Encounter (HOSPITAL_COMMUNITY): Payer: Self-pay | Admitting: *Deleted

## 2018-12-25 ENCOUNTER — Emergency Department (HOSPITAL_COMMUNITY): Payer: Medicare Other

## 2018-12-25 ENCOUNTER — Inpatient Hospital Stay (HOSPITAL_COMMUNITY)
Admission: EM | Admit: 2018-12-25 | Discharge: 2019-01-01 | DRG: 854 | Disposition: A | Discharge status: Home Health | Payer: Medicare Other | Attending: Family Medicine | Admitting: Family Medicine

## 2018-12-25 DIAGNOSIS — A419 Sepsis, unspecified organism: Principal | ICD-10-CM | POA: Diagnosis present

## 2018-12-25 DIAGNOSIS — Q8740 Marfan's syndrome, unspecified: Secondary | ICD-10-CM

## 2018-12-25 DIAGNOSIS — M868X7 Other osteomyelitis, ankle and foot: Secondary | ICD-10-CM | POA: Diagnosis not present

## 2018-12-25 DIAGNOSIS — E46 Unspecified protein-calorie malnutrition: Secondary | ICD-10-CM | POA: Diagnosis present

## 2018-12-25 DIAGNOSIS — Z87891 Personal history of nicotine dependence: Secondary | ICD-10-CM

## 2018-12-25 DIAGNOSIS — M659 Synovitis and tenosynovitis, unspecified: Secondary | ICD-10-CM | POA: Diagnosis present

## 2018-12-25 DIAGNOSIS — H548 Legal blindness, as defined in USA: Secondary | ICD-10-CM | POA: Diagnosis present

## 2018-12-25 DIAGNOSIS — L02612 Cutaneous abscess of left foot: Secondary | ICD-10-CM | POA: Diagnosis present

## 2018-12-25 DIAGNOSIS — E1122 Type 2 diabetes mellitus with diabetic chronic kidney disease: Secondary | ICD-10-CM | POA: Diagnosis present

## 2018-12-25 DIAGNOSIS — Z7952 Long term (current) use of systemic steroids: Secondary | ICD-10-CM

## 2018-12-25 DIAGNOSIS — M869 Osteomyelitis, unspecified: Secondary | ICD-10-CM

## 2018-12-25 DIAGNOSIS — I712 Thoracic aortic aneurysm, without rupture: Secondary | ICD-10-CM | POA: Diagnosis present

## 2018-12-25 DIAGNOSIS — Z7984 Long term (current) use of oral hypoglycemic drugs: Secondary | ICD-10-CM

## 2018-12-25 DIAGNOSIS — E1149 Type 2 diabetes mellitus with other diabetic neurological complication: Secondary | ICD-10-CM | POA: Diagnosis present

## 2018-12-25 DIAGNOSIS — M86172 Other acute osteomyelitis, left ankle and foot: Secondary | ICD-10-CM | POA: Diagnosis present

## 2018-12-25 DIAGNOSIS — L97529 Non-pressure chronic ulcer of other part of left foot with unspecified severity: Secondary | ICD-10-CM | POA: Diagnosis present

## 2018-12-25 DIAGNOSIS — N1831 Chronic kidney disease, stage 3a: Secondary | ICD-10-CM | POA: Diagnosis present

## 2018-12-25 DIAGNOSIS — B9620 Unspecified Escherichia coli [E. coli] as the cause of diseases classified elsewhere: Secondary | ICD-10-CM | POA: Diagnosis present

## 2018-12-25 DIAGNOSIS — E1169 Type 2 diabetes mellitus with other specified complication: Secondary | ICD-10-CM | POA: Diagnosis present

## 2018-12-25 DIAGNOSIS — I129 Hypertensive chronic kidney disease with stage 1 through stage 4 chronic kidney disease, or unspecified chronic kidney disease: Secondary | ICD-10-CM | POA: Diagnosis present

## 2018-12-25 DIAGNOSIS — E1142 Type 2 diabetes mellitus with diabetic polyneuropathy: Secondary | ICD-10-CM | POA: Diagnosis present

## 2018-12-25 DIAGNOSIS — Z20828 Contact with and (suspected) exposure to other viral communicable diseases: Secondary | ICD-10-CM | POA: Diagnosis present

## 2018-12-25 DIAGNOSIS — Z79899 Other long term (current) drug therapy: Secondary | ICD-10-CM

## 2018-12-25 DIAGNOSIS — E11621 Type 2 diabetes mellitus with foot ulcer: Secondary | ICD-10-CM | POA: Diagnosis present

## 2018-12-25 LAB — COMPREHENSIVE METABOLIC PANEL
ALT: 36 U/L (ref 0–44)
AST: 27 U/L (ref 15–41)
Albumin: 2.9 g/dL — ABNORMAL LOW (ref 3.5–5.0)
Alkaline Phosphatase: 54 U/L (ref 38–126)
Anion gap: 11 (ref 5–15)
BUN: 27 mg/dL — ABNORMAL HIGH (ref 8–23)
CO2: 21 mmol/L — ABNORMAL LOW (ref 22–32)
Calcium: 9 mg/dL (ref 8.9–10.3)
Chloride: 107 mmol/L (ref 98–111)
Creatinine, Ser: 1.63 mg/dL — ABNORMAL HIGH (ref 0.61–1.24)
GFR calc Af Amer: 49 mL/min — ABNORMAL LOW (ref >60)
GFR calc non Af Amer: 42 mL/min — ABNORMAL LOW (ref >60)
Glucose, Bld: 216 mg/dL — ABNORMAL HIGH (ref 70–99)
Potassium: 4.1 mmol/L (ref 3.5–5.1)
Sodium: 139 mmol/L (ref 135–145)
Total Bilirubin: 1.3 mg/dL — ABNORMAL HIGH (ref 0.3–1.2)
Total Protein: 7.2 g/dL (ref 6.5–8.1)

## 2018-12-25 LAB — CBC WITH DIFFERENTIAL/PLATELET
Abs Immature Granulocytes: 0.08 10*3/uL — ABNORMAL HIGH (ref 0.00–0.07)
Basophils Absolute: 0 10*3/uL (ref 0.0–0.1)
Basophils Relative: 0 %
Eosinophils Absolute: 0.1 10*3/uL (ref 0.0–0.5)
Eosinophils Relative: 0 %
HCT: 38.1 % — ABNORMAL LOW (ref 39.0–52.0)
Hemoglobin: 11.4 g/dL — ABNORMAL LOW (ref 13.0–17.0)
Immature Granulocytes: 1 %
Lymphocytes Relative: 6 %
Lymphs Abs: 0.9 10*3/uL (ref 0.7–4.0)
MCH: 29.2 pg (ref 26.0–34.0)
MCHC: 29.9 g/dL — ABNORMAL LOW (ref 30.0–36.0)
MCV: 97.4 fL (ref 80.0–100.0)
Monocytes Absolute: 1.5 10*3/uL — ABNORMAL HIGH (ref 0.1–1.0)
Monocytes Relative: 9 %
Neutro Abs: 13.3 10*3/uL — ABNORMAL HIGH (ref 1.7–7.7)
Neutrophils Relative %: 84 %
Platelets: 334 10*3/uL (ref 150–400)
RBC: 3.91 MIL/uL — ABNORMAL LOW (ref 4.22–5.81)
RDW: 11.6 % (ref 11.5–15.5)
WBC: 15.8 10*3/uL — ABNORMAL HIGH (ref 4.0–10.5)
nRBC: 0 % (ref 0.0–0.2)

## 2018-12-25 LAB — LACTIC ACID, PLASMA: Lactic Acid, Venous: 1.2 mmol/L (ref 0.5–1.9)

## 2018-12-25 LAB — URINALYSIS, ROUTINE W REFLEX MICROSCOPIC
Bacteria, UA: NONE SEEN
Bilirubin Urine: NEGATIVE
Glucose, UA: >=500 mg/dL — AB
Ketones, ur: NEGATIVE mg/dL
Leukocytes,Ua: NEGATIVE
Nitrite: NEGATIVE
Protein, ur: NEGATIVE mg/dL
Specific Gravity, Urine: 1.031 — ABNORMAL HIGH (ref 1.005–1.030)
pH: 5 (ref 5.0–8.0)

## 2018-12-25 LAB — PROTIME-INR
INR: 1.1 (ref 0.8–1.2)
Prothrombin Time: 14 seconds (ref 11.4–15.2)

## 2018-12-25 IMAGING — CR DG FOOT COMPLETE 3+V*L*
3 series · 3 of 3 positions shown · non-contrast
Comparison: None.

CLINICAL DATA: Left foot infection

EXAM:
LEFT FOOT - COMPLETE 3+ VIEW

[foot ap]
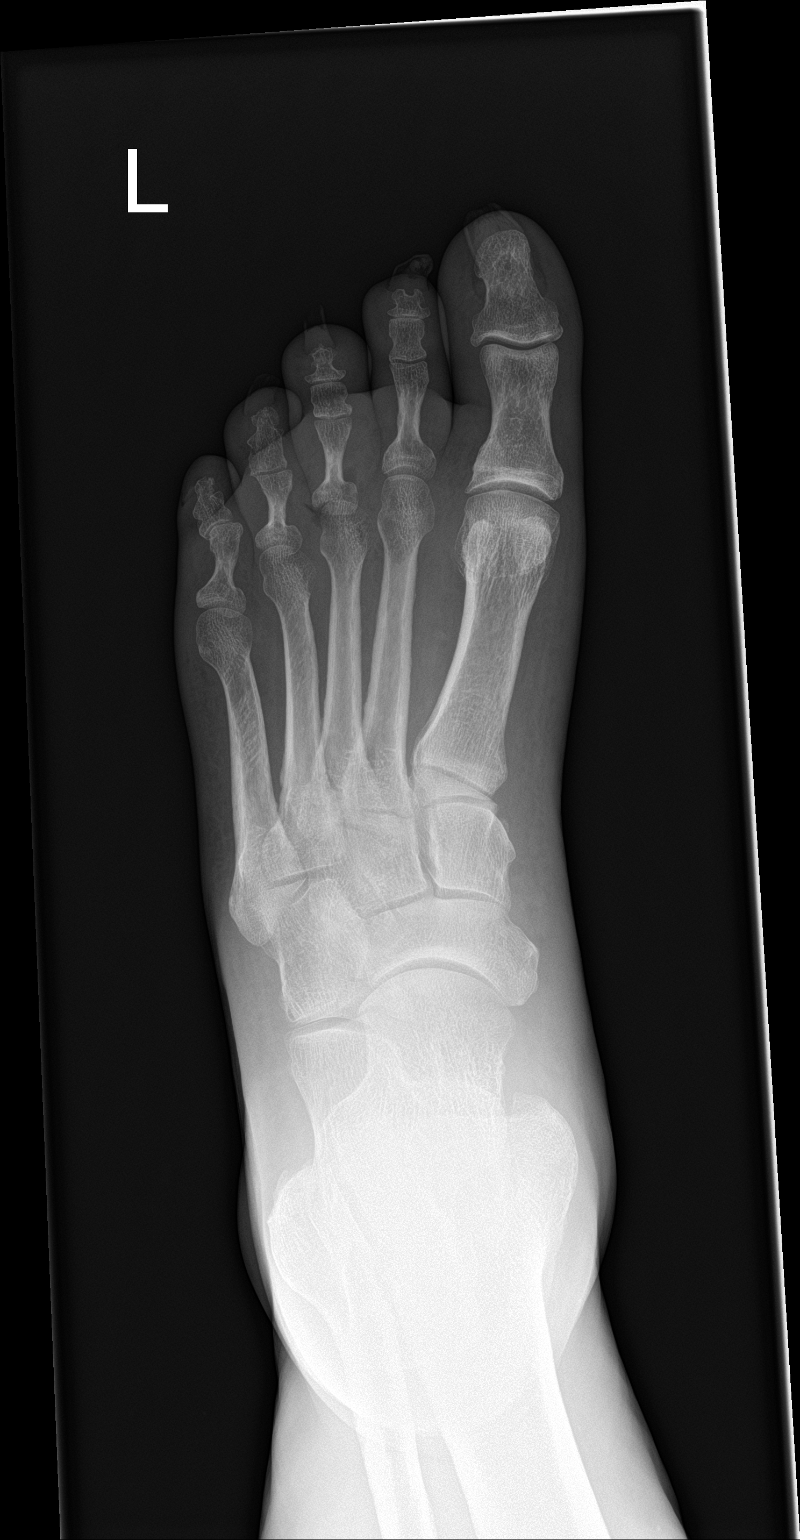

[foot obl]
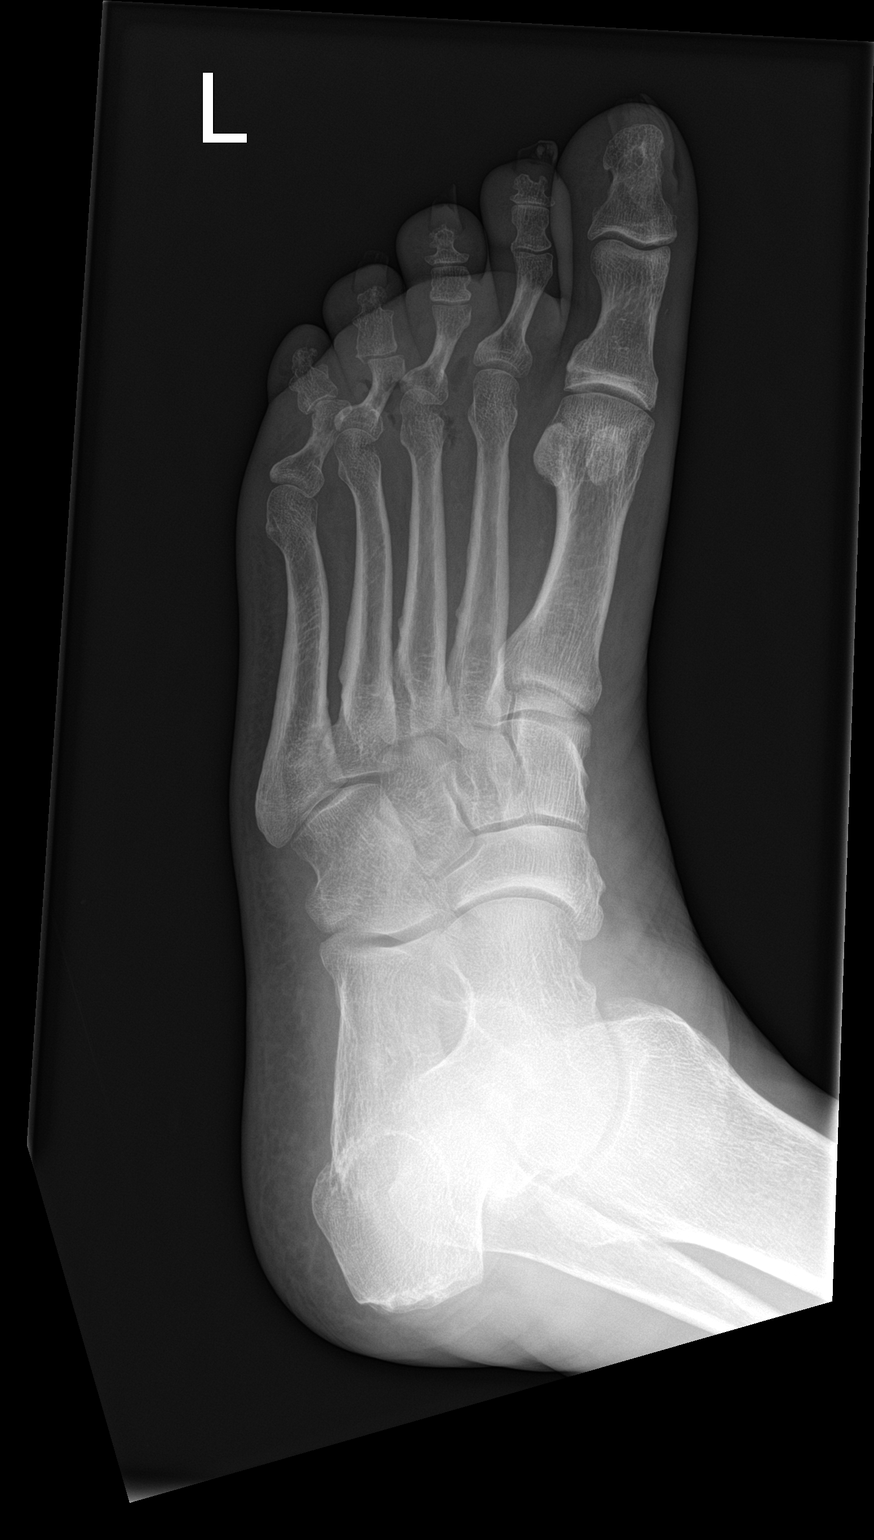

[foot lat]
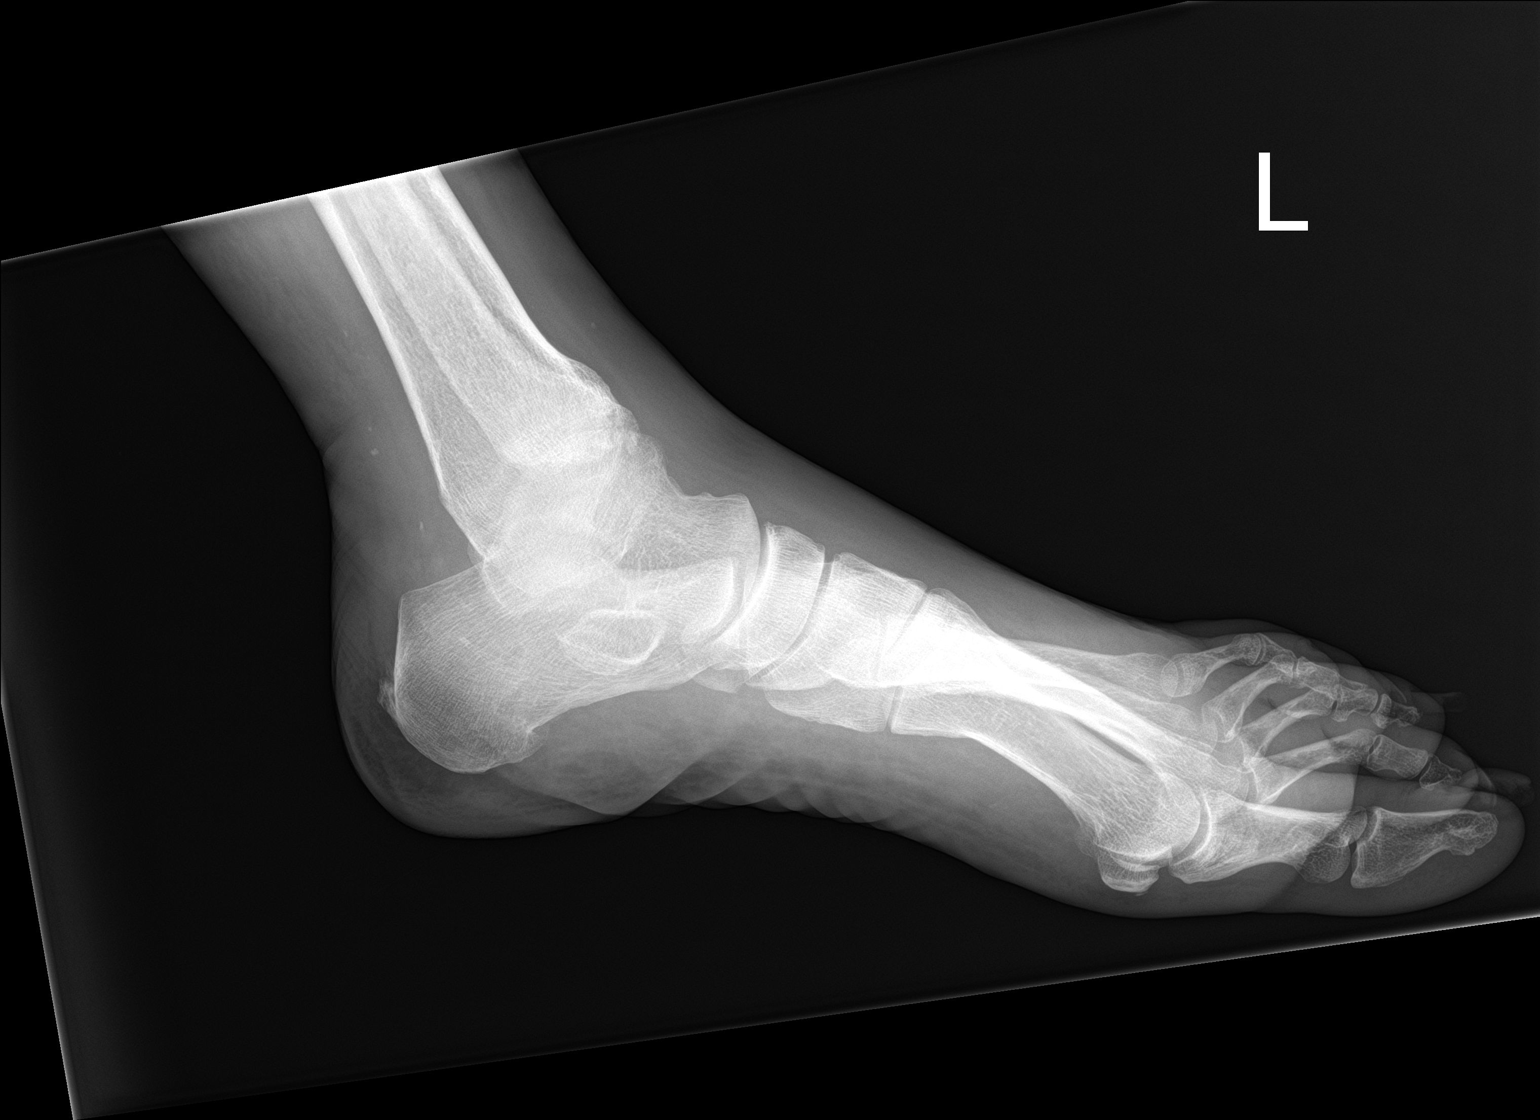

[3 of 3 positions shown; findings below may reference images not displayed]

FINDINGS: Soft tissue ulceration is noted along the volar aspect at the head
of the third metatarsal. Subcortical lucency of third metatarsal
head raises some suspicion for early features of osteomyelitis. No
destructive change or periostitis is present at this time.
Questionable fragmentation at the tuft of the fifth distal phalanx.
No other acute bony abnormality. There is congenital fusion of the
fourth and fifth middle and distal phalanges.
IMPRESSION: 1. Soft tissue ulceration and gas along the volar aspect at the head
of the third metatarsal. Subcortical lucency of the third metatarsal
head raises suspicion for early features of osteomyelitis.
2. Questionable fragmentation at the tuft of the fifth distal
phalanx, correlate with point tenderness to assess for acute
fracture.

## 2018-12-25 IMAGING — CR DG CHEST 2V
2 series · 2 of 2 positions shown · non-contrast
Comparison: Chest radiograph [DATE]

CLINICAL DATA: Pt arrives via [REDACTED]. C/o sore on the left foot
(diabetic ulcer) for about 2-3 weeks. Increased pain with
ambulation. Fever for 3 days. Has taken tylenol for the same.

EXAM:
CHEST - 2 VIEW

[chest pa]
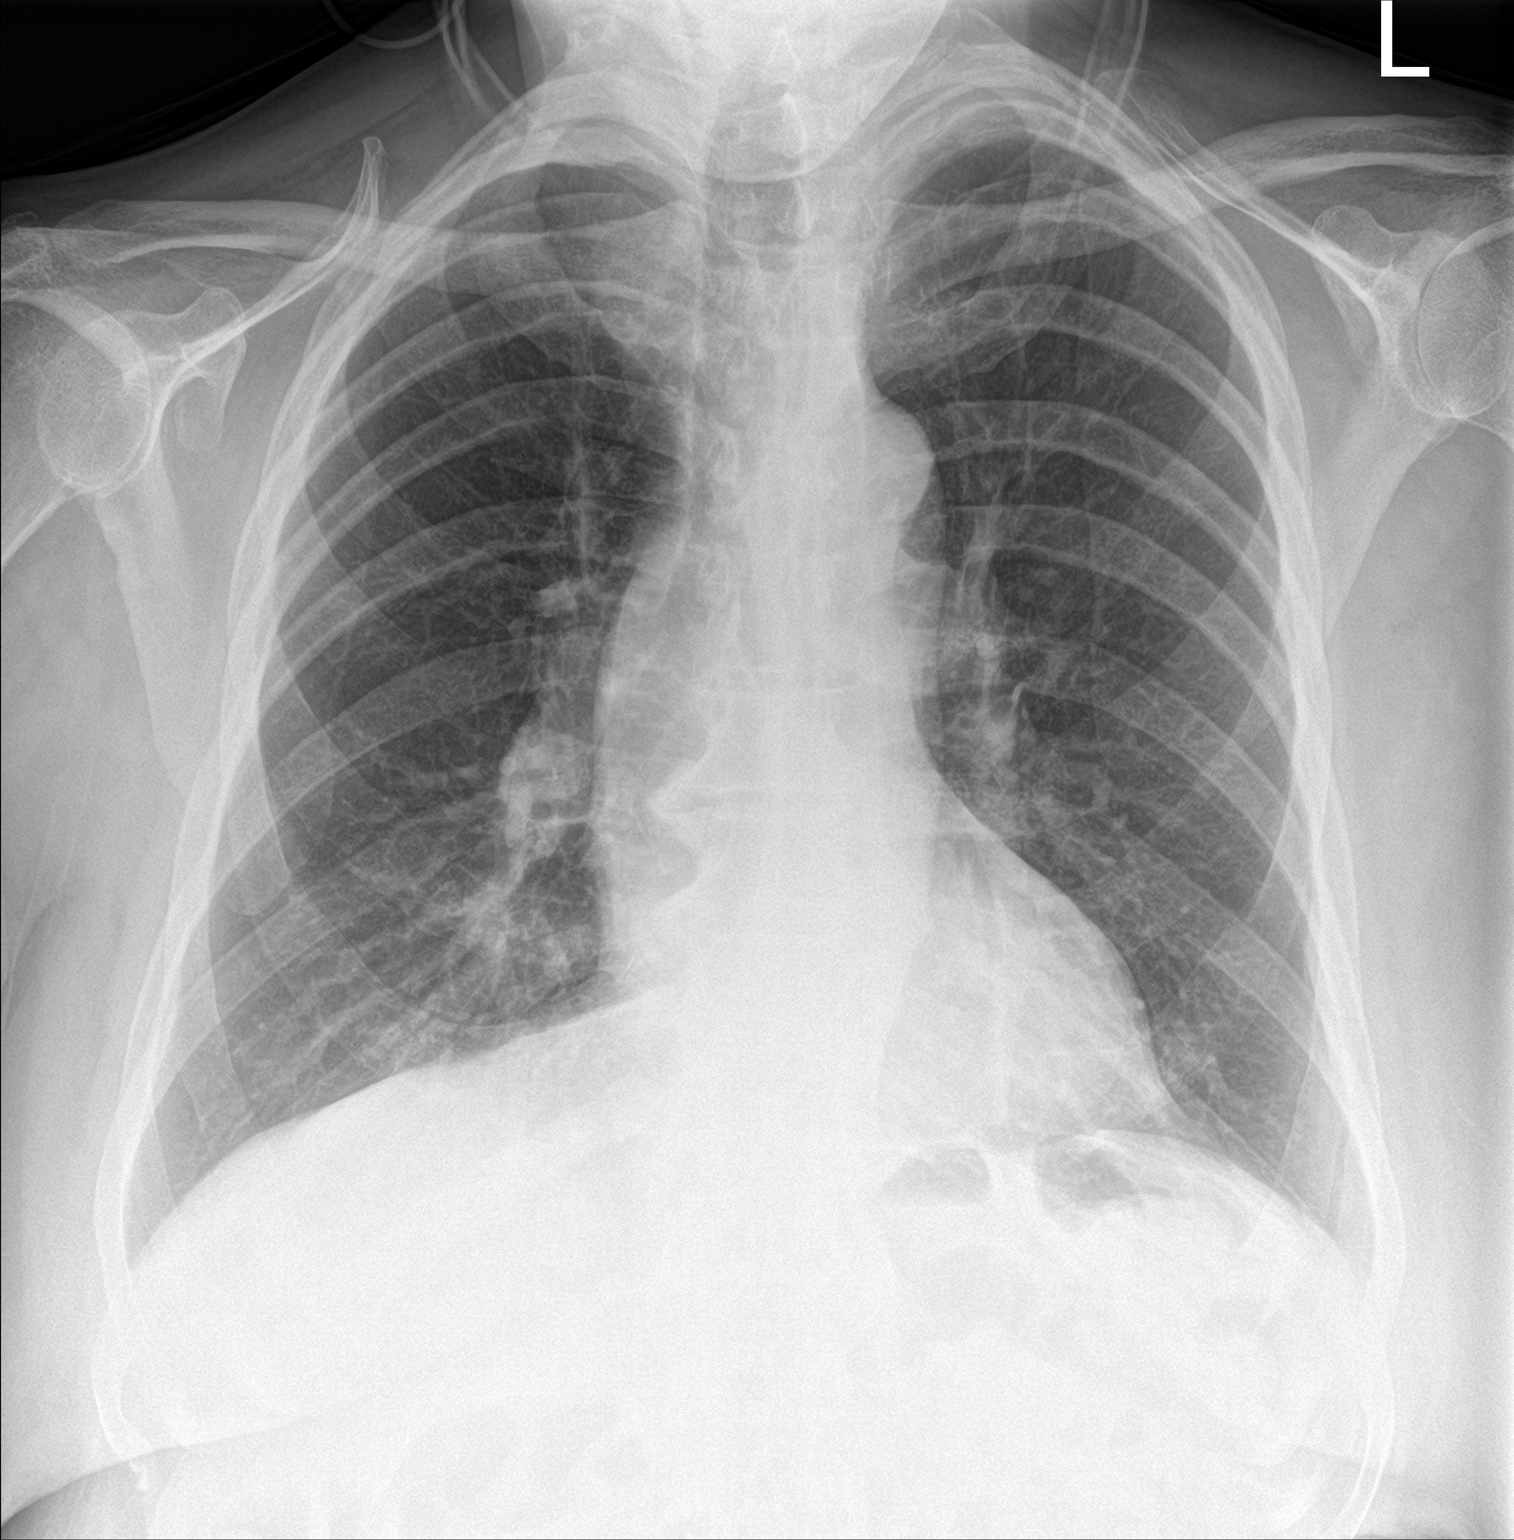

[chest lat]
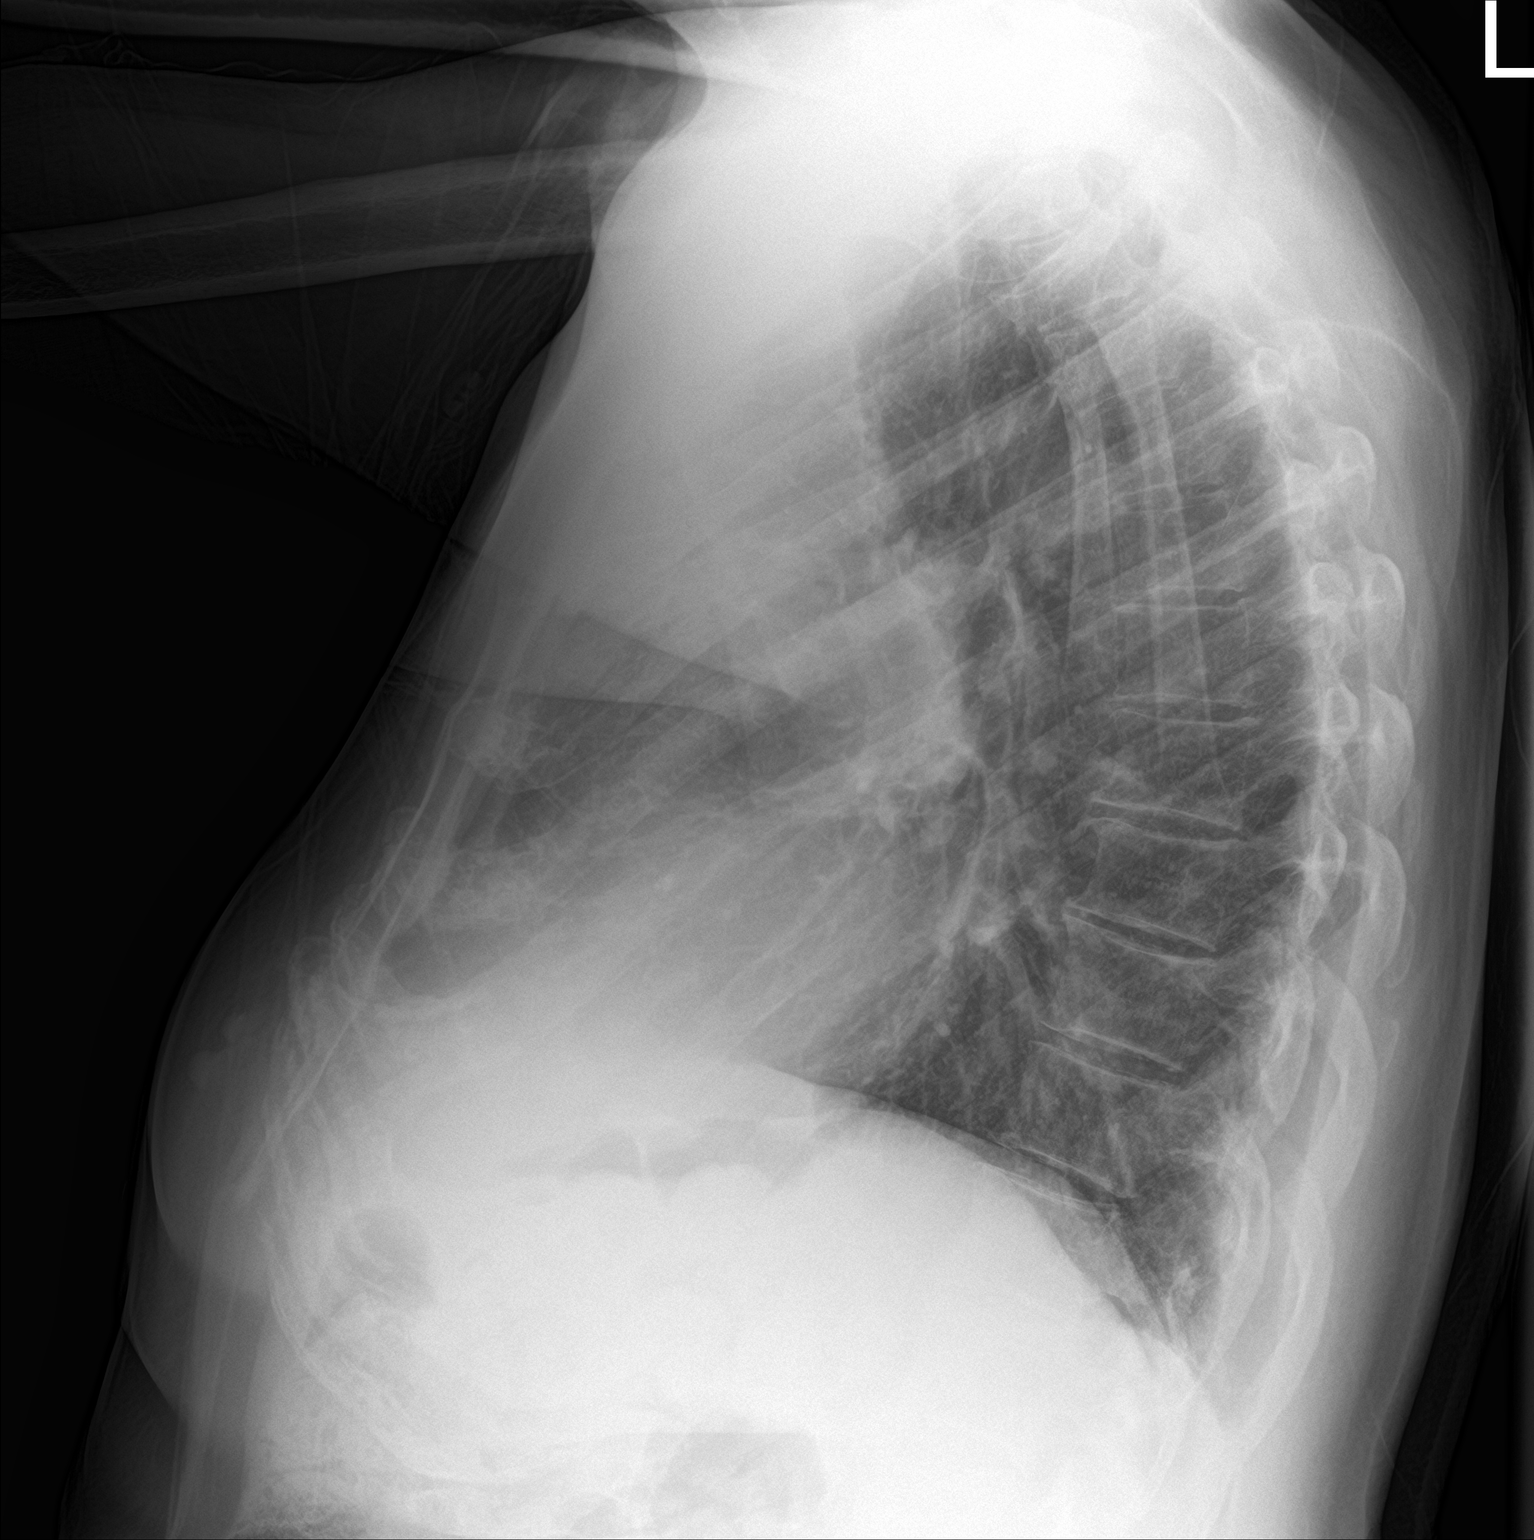

[2 of 2 positions shown; findings below may reference images not displayed]

FINDINGS: Stable cardiomediastinal contours. The lungs are clear. No
pneumothorax or pleural effusion. No acute finding in the visualized
skeleton.
IMPRESSION: No evidence of active disease in the chest.

## 2018-12-25 MED ORDER — ACETAMINOPHEN 325 MG PO TABS
650.0000 mg | ORAL_TABLET | Freq: Once | ORAL | Status: AC
  Administered 2018-12-25: 650 mg via ORAL
  Filled 2018-12-25: qty 2

## 2018-12-25 MED ORDER — SODIUM CHLORIDE 0.9% FLUSH: 3.0000 mL | Freq: Once | INTRAVENOUS | Status: DC

## 2018-12-25 NOTE — ED Triage Notes (Signed)
Pt is blind. Wound to the plantar surface of the left foot. CBG 326 for EMS. Pt is blind.

## 2018-12-25 NOTE — ED Triage Notes (Signed)
Pt arrives via [facility]. C/o sore on the left foot (diabetic ulcer) for about 2-3 weeks. Increased pain with ambulation. Fever for 3 days. Has taken tylenol for the same. 188/74, 108, 18, 97% SP O2,

## 2018-12-26 ENCOUNTER — Inpatient Hospital Stay (HOSPITAL_COMMUNITY): Payer: Medicare Other

## 2018-12-26 ENCOUNTER — Encounter (HOSPITAL_COMMUNITY): Payer: Medicare Other

## 2018-12-26 DIAGNOSIS — Q8740 Marfan's syndrome, unspecified: Secondary | ICD-10-CM

## 2018-12-26 DIAGNOSIS — E1121 Type 2 diabetes mellitus with diabetic nephropathy: Secondary | ICD-10-CM | POA: Diagnosis not present

## 2018-12-26 DIAGNOSIS — E1159 Type 2 diabetes mellitus with other circulatory complications: Secondary | ICD-10-CM | POA: Diagnosis not present

## 2018-12-26 DIAGNOSIS — I129 Hypertensive chronic kidney disease with stage 1 through stage 4 chronic kidney disease, or unspecified chronic kidney disease: Secondary | ICD-10-CM | POA: Diagnosis present

## 2018-12-26 DIAGNOSIS — E1122 Type 2 diabetes mellitus with diabetic chronic kidney disease: Secondary | ICD-10-CM | POA: Diagnosis present

## 2018-12-26 DIAGNOSIS — N1832 Chronic kidney disease, stage 3b: Secondary | ICD-10-CM | POA: Diagnosis not present

## 2018-12-26 DIAGNOSIS — E1149 Type 2 diabetes mellitus with other diabetic neurological complication: Secondary | ICD-10-CM | POA: Diagnosis present

## 2018-12-26 DIAGNOSIS — B9620 Unspecified Escherichia coli [E. coli] as the cause of diseases classified elsewhere: Secondary | ICD-10-CM | POA: Diagnosis present

## 2018-12-26 DIAGNOSIS — I712 Thoracic aortic aneurysm, without rupture: Secondary | ICD-10-CM | POA: Diagnosis present

## 2018-12-26 DIAGNOSIS — L02612 Cutaneous abscess of left foot: Secondary | ICD-10-CM | POA: Diagnosis present

## 2018-12-26 DIAGNOSIS — Z7984 Long term (current) use of oral hypoglycemic drugs: Secondary | ICD-10-CM | POA: Diagnosis not present

## 2018-12-26 DIAGNOSIS — E118 Type 2 diabetes mellitus with unspecified complications: Secondary | ICD-10-CM | POA: Diagnosis not present

## 2018-12-26 DIAGNOSIS — E46 Unspecified protein-calorie malnutrition: Secondary | ICD-10-CM | POA: Diagnosis present

## 2018-12-26 DIAGNOSIS — M659 Synovitis and tenosynovitis, unspecified: Secondary | ICD-10-CM | POA: Diagnosis present

## 2018-12-26 DIAGNOSIS — M86172 Other acute osteomyelitis, left ankle and foot: Secondary | ICD-10-CM | POA: Diagnosis present

## 2018-12-26 DIAGNOSIS — A419 Sepsis, unspecified organism: Principal | ICD-10-CM

## 2018-12-26 DIAGNOSIS — E11621 Type 2 diabetes mellitus with foot ulcer: Secondary | ICD-10-CM | POA: Diagnosis present

## 2018-12-26 DIAGNOSIS — I10 Essential (primary) hypertension: Secondary | ICD-10-CM | POA: Diagnosis not present

## 2018-12-26 DIAGNOSIS — R609 Edema, unspecified: Secondary | ICD-10-CM | POA: Diagnosis not present

## 2018-12-26 DIAGNOSIS — E1142 Type 2 diabetes mellitus with diabetic polyneuropathy: Secondary | ICD-10-CM | POA: Diagnosis present

## 2018-12-26 DIAGNOSIS — Z20828 Contact with and (suspected) exposure to other viral communicable diseases: Secondary | ICD-10-CM | POA: Diagnosis present

## 2018-12-26 DIAGNOSIS — E119 Type 2 diabetes mellitus without complications: Secondary | ICD-10-CM | POA: Diagnosis not present

## 2018-12-26 DIAGNOSIS — Z87891 Personal history of nicotine dependence: Secondary | ICD-10-CM | POA: Diagnosis not present

## 2018-12-26 DIAGNOSIS — E1169 Type 2 diabetes mellitus with other specified complication: Secondary | ICD-10-CM | POA: Diagnosis present

## 2018-12-26 DIAGNOSIS — Z7952 Long term (current) use of systemic steroids: Secondary | ICD-10-CM | POA: Diagnosis not present

## 2018-12-26 DIAGNOSIS — Z79899 Other long term (current) drug therapy: Secondary | ICD-10-CM | POA: Diagnosis not present

## 2018-12-26 DIAGNOSIS — M868X7 Other osteomyelitis, ankle and foot: Secondary | ICD-10-CM | POA: Diagnosis present

## 2018-12-26 DIAGNOSIS — L97529 Non-pressure chronic ulcer of other part of left foot with unspecified severity: Secondary | ICD-10-CM | POA: Diagnosis present

## 2018-12-26 DIAGNOSIS — N1831 Chronic kidney disease, stage 3a: Secondary | ICD-10-CM | POA: Diagnosis present

## 2018-12-26 DIAGNOSIS — H548 Legal blindness, as defined in USA: Secondary | ICD-10-CM | POA: Diagnosis present

## 2018-12-26 HISTORY — DX: Other acute osteomyelitis, left ankle and foot: M86.172

## 2018-12-26 LAB — CBC
HCT: 34.5 % — ABNORMAL LOW (ref 39.0–52.0)
Hemoglobin: 10.2 g/dL — ABNORMAL LOW (ref 13.0–17.0)
MCH: 29.3 pg (ref 26.0–34.0)
MCHC: 29.6 g/dL — ABNORMAL LOW (ref 30.0–36.0)
MCV: 99.1 fL (ref 80.0–100.0)
Platelets: 274 10*3/uL (ref 150–400)
RBC: 3.48 MIL/uL — ABNORMAL LOW (ref 4.22–5.81)
RDW: 11.6 % (ref 11.5–15.5)
WBC: 17 10*3/uL — ABNORMAL HIGH (ref 4.0–10.5)
nRBC: 0 % (ref 0.0–0.2)

## 2018-12-26 LAB — HIV ANTIBODY (ROUTINE TESTING W REFLEX): HIV Screen 4th Generation wRfx: NONREACTIVE

## 2018-12-26 LAB — SARS CORONAVIRUS 2 (TAT 6-24 HRS): SARS Coronavirus 2: NEGATIVE

## 2018-12-26 LAB — ECHOCARDIOGRAM COMPLETE
Height: 77 in
Weight: 3680 oz

## 2018-12-26 LAB — BASIC METABOLIC PANEL
Anion gap: 14 (ref 5–15)
BUN: 33 mg/dL — ABNORMAL HIGH (ref 8–23)
CO2: 17 mmol/L — ABNORMAL LOW (ref 22–32)
Calcium: 8.5 mg/dL — ABNORMAL LOW (ref 8.9–10.3)
Chloride: 110 mmol/L (ref 98–111)
Creatinine, Ser: 1.54 mg/dL — ABNORMAL HIGH (ref 0.61–1.24)
GFR calc Af Amer: 52 mL/min — ABNORMAL LOW (ref >60)
GFR calc non Af Amer: 45 mL/min — ABNORMAL LOW (ref >60)
Glucose, Bld: 170 mg/dL — ABNORMAL HIGH (ref 70–99)
Potassium: 4.1 mmol/L (ref 3.5–5.1)
Sodium: 141 mmol/L (ref 135–145)

## 2018-12-26 LAB — HEMOGLOBIN A1C
Hgb A1c MFr Bld: 6.4 % — ABNORMAL HIGH (ref 4.8–5.6)
Mean Plasma Glucose: 136.98 mg/dL

## 2018-12-26 LAB — CBG MONITORING, ED
Glucose-Capillary: 152 mg/dL — ABNORMAL HIGH (ref 70–99)
Glucose-Capillary: 122 mg/dL — ABNORMAL HIGH (ref 70–99)
Glucose-Capillary: 123 mg/dL — ABNORMAL HIGH (ref 70–99)

## 2018-12-26 LAB — GLUCOSE, CAPILLARY
Glucose-Capillary: 115 mg/dL — ABNORMAL HIGH (ref 70–99)
Glucose-Capillary: 107 mg/dL — ABNORMAL HIGH (ref 70–99)

## 2018-12-26 LAB — LACTIC ACID, PLASMA: Lactic Acid, Venous: 1.1 mmol/L (ref 0.5–1.9)

## 2018-12-26 IMAGING — MR MR FOOT*L* WO/W CM
10 series · 40 of 40 positions shown · IV contrast (Gadavist)
Comparison: Radiographs dated [DATE]

CLINICAL DATA: Foot swelling.

EXAM:
MRI OF THE LEFT FOREFOOT WITHOUT AND WITH CONTRAST
TECHNIQUE: Multiplanar, multisequence MR imaging of the left forefoot was
performed both before and after administration of intravenous
contrast.
CONTRAST:  10mL GADAVIST GADOBUTROL 1 MMOL/ML IV SOLN

[Series 4: STIR · sagittal · left · 3.0mm · 0.70mm/px · 3 of 35 slices shown]
[im 1/35]
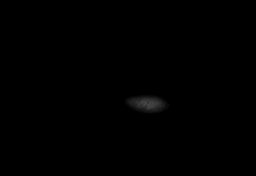
[im 18/35]
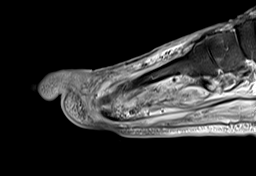
[im 35/35]
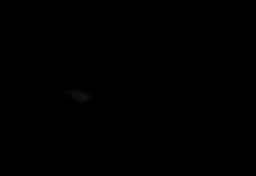

[Series 5: T1 · coronal · left · 3.0mm · 0.47mm/px · 5 of 44 slices shown (1 of 3)]
[im 1/44]
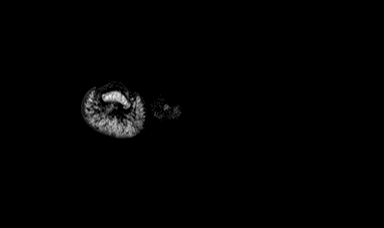
[im 11/44]
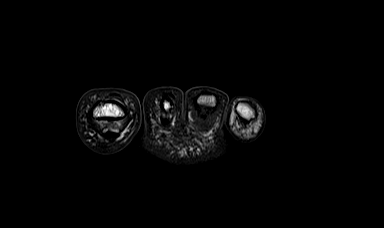
[im 22/44]
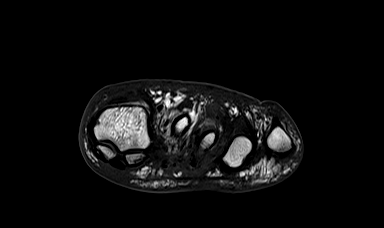
[im 33/44]
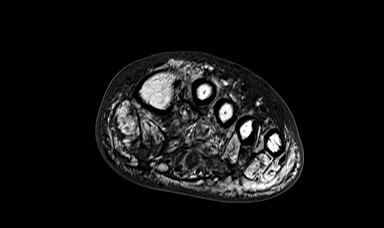
[im 44/44]
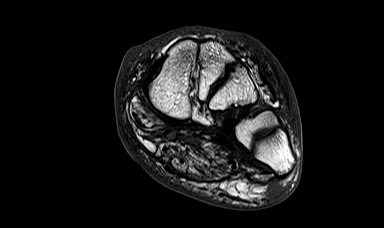

[Series 6: T2 fat-sat · coronal · left · 3.0mm · 0.49mm/px · 5 of 44 slices shown (1 of 2)]
[im 1/44]
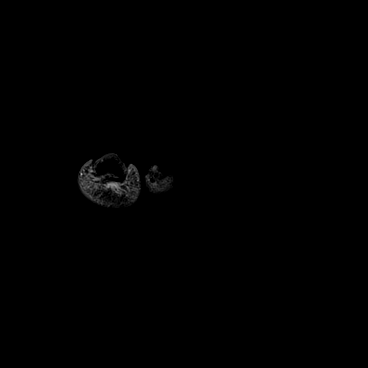
[im 11/44]
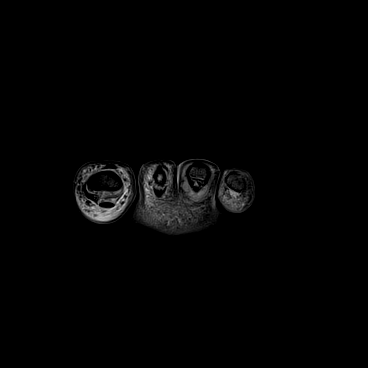
[im 22/44]
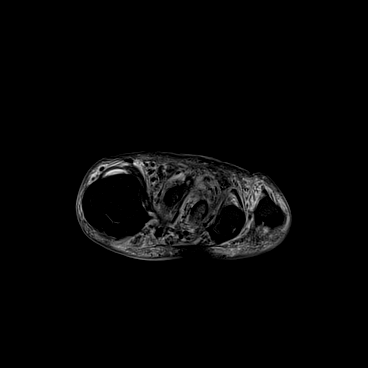
[im 33/44]
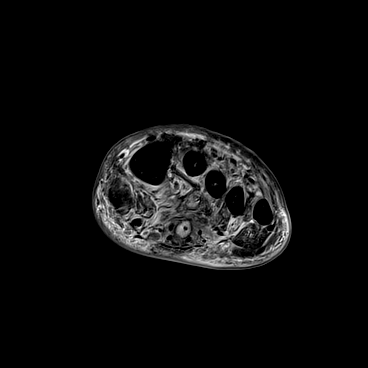
[im 44/44]
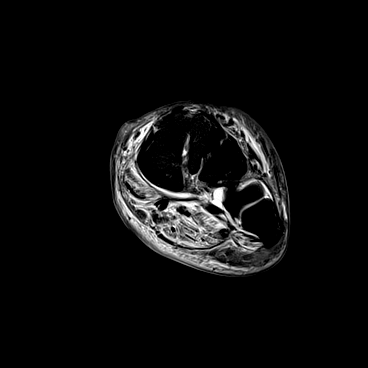

[Series 7: T1 · axial · left · 3.0mm · 0.52mm/px · z∈[-138,-54]mm · 3 of 30 slices shown (2 of 3)]
[im 1/30]
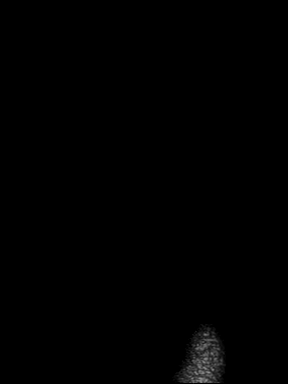
[im 15/30]
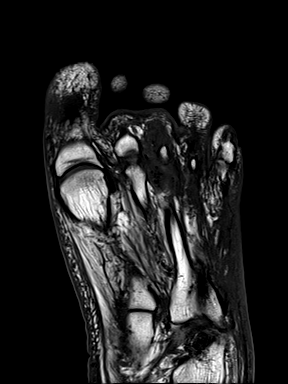
[im 30/30]
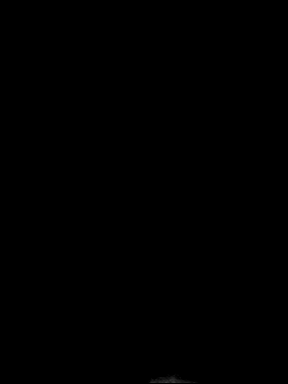

[Series 8: T2 fat-sat · axial · left · 3.0mm · 0.57mm/px · z∈[-138,-54]mm · 3 of 30 slices shown (2 of 2)]
[im 1/30]
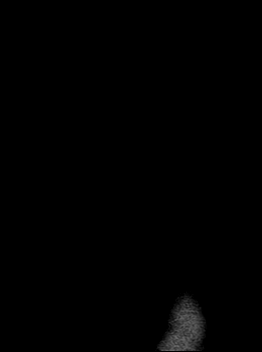
[im 15/30]
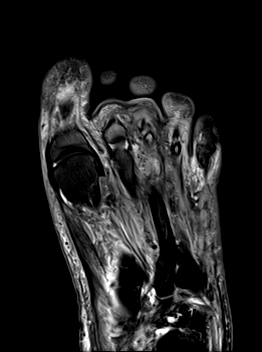
[im 30/30]
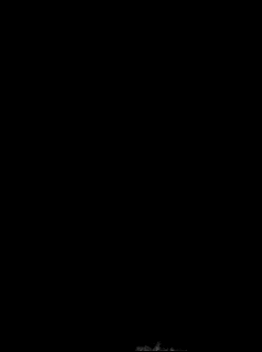

[Series 9: T1 fat-sat · coronal · non-contrast · left · 3.0mm · 0.59mm/px · 5 of 44 slices shown (1 of 3)]
[im 1/44]
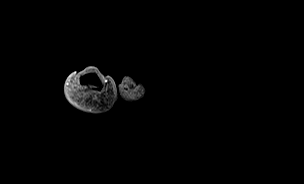
[im 11/44]
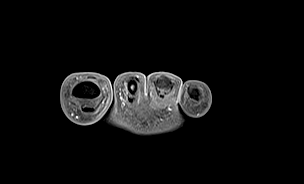
[im 22/44]
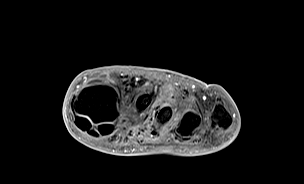
[im 33/44]
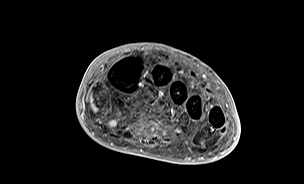
[im 44/44]
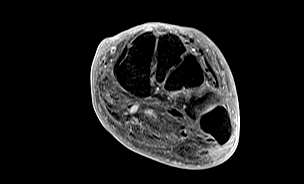

[Series 10: T1 · sagittal · left · 3.0mm · 0.56mm/px · 4 of 35 slices shown (3 of 3)]
[im 1/35]
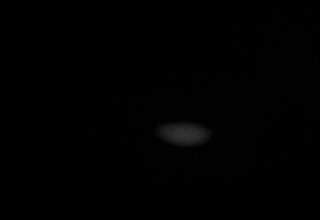
[im 12/35]
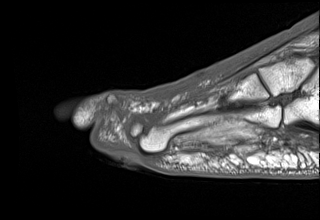
[im 23/35]
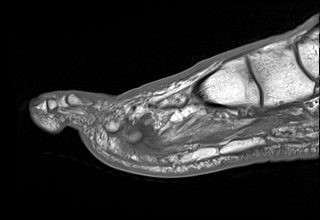
[im 35/35]
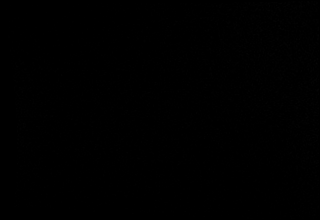

[Series 11: T1 fat-sat post-contrast · coronal · left · 3.0mm · 0.59mm/px · 5 of 44 slices shown]
[im 1/44]
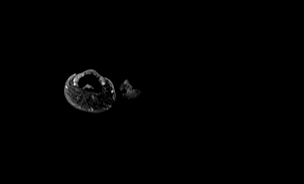
[im 11/44]
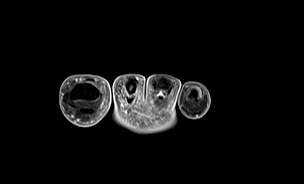
[im 22/44]
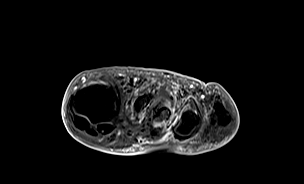
[im 33/44]
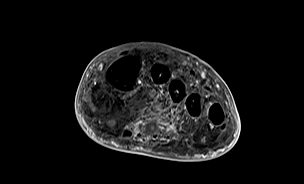
[im 44/44]
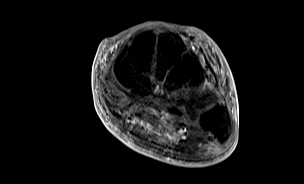

[Series 12: T1 fat-sat · sagittal · left · 3.0mm · 0.56mm/px · 4 of 36 slices shown (2 of 3)]
[im 1/36]
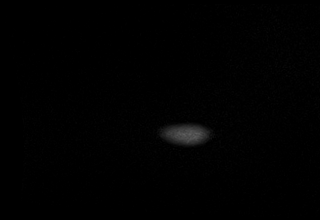
[im 12/36]
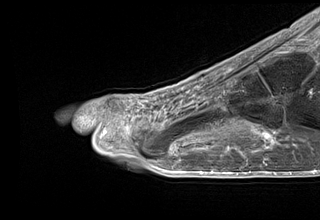
[im 24/36]
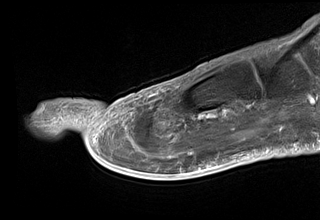
[im 36/36]
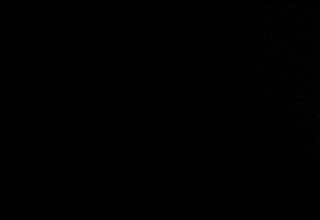

[Series 13: T1 fat-sat · axial · left · 3.0mm · 0.56mm/px · z∈[-134,-49]mm · 3 of 30 slices shown (3 of 3)]
[im 1/30]
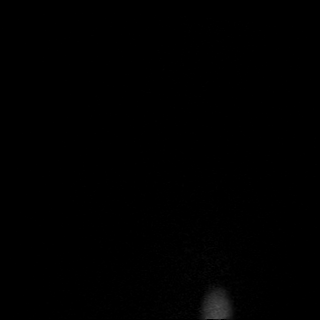
[im 15/30]
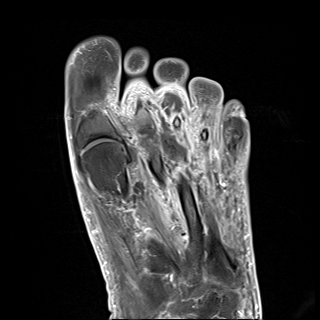
[im 30/30]
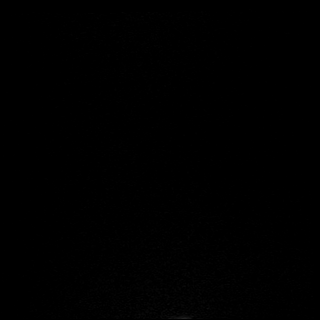

[40 of 40 positions shown; findings below may reference images not displayed]

FINDINGS: Bones/Joint/Cartilage

There is irregular enhancement of the head of the third metatarsal
with subtle decreased signal in the same area on T1 weighted
imaging. This is consistent with osteomyelitis of the third
metatarsal head. There is slight enhancement of the base of the
proximal phalanx of the third toe with edema in the proximal
phalanx. There is an effusion at the third MTP joint with gas in the
fluid. Gas and fluid extends proximally along the extensor and
flexor tendon sheaths of the third ray best seen on image 19 of
series 4.

No other significant bone abnormality. Specifically, the distal
phalanx of the little toe appears normal.

Muscles and Tendons

Infectious tenosynovitis involving the flexor and extensor tendons
of the third ray as described above. Gas in the fluid surrounding
the tendons adjacent to the third metatarsal.

Soft tissues

Soft tissue ulceration on the plantar aspect of the foot with the
level of the head of the third metatarsal. Enhancement of the
adjacent soft tissues after contrast administration consistent with
cellulitis.
IMPRESSION: 1. Osteomyelitis of the head of the third metatarsal. Infection of
the third MTP joint.
2. Infectious tenosynovitis of the flexor and extensor tendons of
the third ray.
3. Soft tissue ulceration on the plantar aspect of the foot at the
level of the head of the third metatarsal.

## 2018-12-26 MED ORDER — SODIUM CHLORIDE 0.9 % IV SOLN
2.0000 g | Freq: Two times a day (BID) | INTRAVENOUS | Status: DC
  Administered 2018-12-26 (×2): 2 g via INTRAVENOUS
  Filled 2018-12-26 (×5): qty 2

## 2018-12-26 MED ORDER — OXYCODONE HCL 5 MG PO TABS
5.0000 mg | ORAL_TABLET | Freq: Four times a day (QID) | ORAL | Status: DC | PRN
  Administered 2018-12-26 – 2018-12-28 (×5): 5 mg via ORAL
  Filled 2018-12-26 (×6): qty 1

## 2018-12-26 MED ORDER — GADOBUTROL 1 MMOL/ML IV SOLN
10.0000 mL | Freq: Once | INTRAVENOUS | Status: AC | PRN
  Administered 2018-12-26: 10 mL via INTRAVENOUS

## 2018-12-26 MED ORDER — METOPROLOL SUCCINATE ER 25 MG PO TB24: 12.5000 mg | ORAL_TABLET | Freq: Every day | ORAL | Status: DC

## 2018-12-26 MED ORDER — VANCOMYCIN HCL 10 G IV SOLR
1750.0000 mg | INTRAVENOUS | Status: DC
  Administered 2018-12-27: 1750 mg via INTRAVENOUS
  Filled 2018-12-26 (×2): qty 1750

## 2018-12-26 MED ORDER — METOPROLOL TARTRATE 12.5 MG HALF TABLET
12.5000 mg | ORAL_TABLET | Freq: Two times a day (BID) | ORAL | Status: DC
  Administered 2018-12-27 – 2019-01-01 (×11): 12.5 mg via ORAL
  Filled 2018-12-26 (×12): qty 1

## 2018-12-26 MED ORDER — SIMVASTATIN 20 MG PO TABS
40.0000 mg | ORAL_TABLET | Freq: Every day | ORAL | Status: DC
  Administered 2018-12-26: 40 mg via ORAL
  Filled 2018-12-26: qty 2

## 2018-12-26 MED ORDER — HEPARIN SODIUM (PORCINE) 5000 UNIT/ML IJ SOLN
5000.0000 [IU] | Freq: Three times a day (TID) | INTRAMUSCULAR | Status: AC
  Administered 2018-12-26 – 2018-12-27 (×5): 5000 [IU] via SUBCUTANEOUS
  Filled 2018-12-26 (×6): qty 1

## 2018-12-26 MED ORDER — ACETAMINOPHEN 650 MG RE SUPP
650.0000 mg | Freq: Four times a day (QID) | RECTAL | Status: DC | PRN
  Filled 2018-12-26: qty 1

## 2018-12-26 MED ORDER — VANCOMYCIN HCL 10 G IV SOLR
2000.0000 mg | Freq: Once | INTRAVENOUS | Status: AC
  Administered 2018-12-26: 2000 mg via INTRAVENOUS
  Filled 2018-12-26: qty 2000

## 2018-12-26 MED ORDER — SODIUM CHLORIDE 0.9 % IV SOLN
INTRAVENOUS | Status: DC
  Administered 2018-12-26: 02:00:00 via INTRAVENOUS

## 2018-12-26 MED ORDER — PIPERACILLIN-TAZOBACTAM 3.375 G IVPB
3.3750 g | Freq: Three times a day (TID) | INTRAVENOUS | Status: DC
  Filled 2018-12-26: qty 50

## 2018-12-26 MED ORDER — METRONIDAZOLE 500 MG PO TABS
500.0000 mg | ORAL_TABLET | Freq: Three times a day (TID) | ORAL | Status: DC
  Administered 2018-12-26 – 2018-12-27 (×5): 500 mg via ORAL
  Filled 2018-12-26 (×6): qty 1

## 2018-12-26 MED ORDER — INSULIN ASPART 100 UNIT/ML ~~LOC~~ SOLN
0.0000 [IU] | SUBCUTANEOUS | Status: DC
  Administered 2018-12-26: 2 [IU] via SUBCUTANEOUS
  Administered 2018-12-26: 1 [IU] via SUBCUTANEOUS

## 2018-12-26 MED ORDER — ACETAMINOPHEN 325 MG PO TABS
650.0000 mg | ORAL_TABLET | Freq: Four times a day (QID) | ORAL | Status: DC | PRN
  Administered 2018-12-27: 650 mg via ORAL
  Filled 2018-12-26: qty 2

## 2018-12-26 MED ORDER — ATORVASTATIN CALCIUM 40 MG PO TABS
40.0000 mg | ORAL_TABLET | Freq: Every day | ORAL | Status: DC
  Administered 2018-12-27: 40 mg via ORAL
  Filled 2018-12-26: qty 1

## 2018-12-26 MED ORDER — PIPERACILLIN-TAZOBACTAM 3.375 G IVPB 30 MIN
3.3750 g | Freq: Once | INTRAVENOUS | Status: AC
  Administered 2018-12-26: 3.375 g via INTRAVENOUS
  Filled 2018-12-26: qty 50

## 2018-12-26 NOTE — ED Notes (Signed)
Per Dr. Sharol Given the patient will have surgery on Wednesday. He has given me verbal order to re-enter his diet order.

## 2018-12-26 NOTE — Progress Notes (Signed)
Pharmacy Antibiotic Note  Chad Hall is a 68 y.o. male admitted on 12/25/2018 with L foot pain, possible osteomyelitis.  Pharmacy has been consulted for Vancomycin and Zosyn  Dosing.  Vancomycin 2 g IV given in ED at [place]: Vancomycin 1750 mg IV q24h for Est AUC 457 Zosyn 3.375 g IV q8h   Height: 6\' 5"  (195.6 cm) Weight: 230 lb (104.3 kg) IBW/kg (Calculated) : 89.1  Temp (24hrs), Avg:101.6 F (38.7 C), Min:99 F (37.2 C), Max:103.3 F (39.6 C)  Recent Labs  Lab 12/25/18 1819 12/26/18 0056 12/26/18 0311  WBC 15.8*  --  17.0*  CREATININE 1.63*  --  1.54*  LATICACIDVEN 1.2 1.1  --     Estimated Creatinine Clearance: 56.3 mL/min (A) (by C-G formula based on SCr of 1.54 mg/dL (H)).    No Known Allergies   Caryl Pina 12/26/2018 5:28 AM

## 2018-12-26 NOTE — ED Notes (Signed)
Pt remains in MRI 

## 2018-12-26 NOTE — H&P (Addendum)
Family Medicine Teaching [facility] Admission History and Physical Service Pager: [phone]  Patient name: Chad Hall Medical record number: [id number] Date of birth: [birth year] Age: 68 y.o. Gender: male  Primary Care Provider: Arlyce HarmanLockamy, Timothy, DO Consultants: Orthopedics Code Status: FULL Preferred Emergency Contact: Lynden AngCathy, wife, at [phone]  Chief Complaint:  L foot pain  Assessment and Plan: Chad Gibsonhomas W Brandenberger is a 68 y.o. male presenting with left foot ulcer. PMH is significant for Marfan syndrome, legally blind, hypertension, type 2 diabetes, hyperlipidemia  Sepsis secondary to diabetic ulcer/?Osteomyelitis Patient having pain in left foot with open ulcer and drainage. Febrile on admission 103.3 and received 650 mg of acetaminophen x1.  Labs significant for elevated white count 15.8 with neutrophilic shift.  X-ray left foot impressive for soft tissue ulceration and gas along vulvar aspect at the head of the third metatarsal.  Also noted to have subcortical lucency of the third metatarsal head that raise suspicion for early features of osteomyelitis. Concern for acute fracture at the fifth distal phalanx as well.  Chest x-ray negative for active pulmonary disease.  On exam patient feels warm and continues to have some chills.  Left lower leg 3+ edema up to knee, also has ulcer dorsum of foot.  No obvious drainage noted.  Orthopedic surgery was consulted in the ED and recommended initiation of vancomycin and zosyn, MRI, and they will plan to evaluate in the morning.   -Admit to MedSurg, attending Dr. Manson PasseyBrown -Appreciate ortho recommendations -MRI with and without contrast of left foot  -N.p.o. after midnight for possible surgery, restart diet if no surgery 11/9 -Continue Zosyn(11/8-) pharmacy dosing -Continue vancomycin (11/8-) pharmacy dosing -Oxycodone IR 5 mg every 6 hours as needed -Acetaminophen 650 mg every 6 hours as needed -Monitor fever curve -Blood cultures  pending -Wound cultures pending -Normal saline 125cc/hr -follow up possible L fifth digit fracture on MRI  Diabetes HbA1C 6.4.  Home medication glipizide 10 mg daily, Jardiance 10 mg daily, Tradjenta 5 mg daily, Zocor 40 mg -Hold home medications -Sensitive sliding scale insulin coverage every 4 hours due to need for tighter control while bacteremic -Monitor CBGs -repeat Hemoglobin A1c  Hypertension Normotensive.  Home medication Metoprolol succinate 12.5mg  once daily -Continue to monitor -Lopressor 12.5 mg BID while in hospital due to shorter half life in case of hypotension  Blindness secondary to Marfans Syndrome Be sensitive to patient's needs  CKD Stage 3a Creatinine appears to be around patient's baseline at 1.63.  Can consider switching zosyn to cefepime to avoid kidney injury. -daily BMP -avoid nephrotoxic medications -mIVF at 125 ml/hr while NPO  FEN/GI:  NPO Normal saline 125 cc/hr Prophylaxis:  SCD  Disposition:   History of Present Illness:  Chad Gibsonhomas W Winquist is a 68 y.o. male presenting with osteomyelitis of the L foot.  He reports that he had a callus on his L foot, and he pulled it off about three weeks ago.  Soon after, the area started to drain, and he had difficulty wiggling his toes and caused pain on standing.  He became more concerned when he began feeling feverish and had elevated temperatures at home.  He denies tobacco use and says he drinks a glass of wine once every few weeks.   In the ED he was febrile with temperature of 103.3, tachycardic heart rate of 114 normotensive.  Oxygen saturations 95% on room air.  He was given Zosyn 3.375 g IV x1 and vancomycin 2 g IV x1.  Review Of Systems: Per HPI  with the following additions:  Review of Systems  Constitutional: Positive for chills and fever.  HENT: Negative for sore throat.   Respiratory: Positive for cough. Negative for shortness of breath.   Cardiovascular: Positive for leg swelling. Negative  for chest pain.  Gastrointestinal: Negative for constipation, diarrhea, nausea and vomiting.  Genitourinary: Positive for frequency.  Psychiatric/Behavioral: The patient is not nervous/anxious.     Patient Active Problem List   Diagnosis Date Noted  . Osteomyelitis of foot, left, acute (HCC) 12/26/2018  . Right knee pain 10/04/2017  . Left hip pain 04/10/2013  . Diabetic peripheral neuropathy associated with type 2 diabetes mellitus (HCC) 11/10/2012  . Marfan's syndrome 08/08/2012  . Healthcare maintenance 08/08/2012  . Atopic dermatitis 02/16/2012  . Overweight (BMI 25.0-29.9) 07/05/2011  . Allergic rhinitis due to allergen 07/03/2011  . Venous insufficiency of leg 04/03/2011  . Blindness 07/23/2010  . DM (diabetes mellitus), type 2 with neurological complications (HCC) 06/13/2010  . Hypertension 06/13/2010  . Hyperlipidemia 06/13/2010    Past Medical History: Past Medical History:  Diagnosis Date  . Allergy   . Blindness - both eyes 1967   Basketball injury  . Diabetes mellitus   . Hernia   . Hyperlipidemia   . Hypertension   . Marfan syndrome     Past Surgical History: Past Surgical History:  Procedure Laterality Date  . APPENDECTOMY    . INGUINAL HERNIA REPAIR     Right 1970s,    Left 2011 by Dr Andrey Campanile  . INGUINAL HERNIA REPAIR  08/25/10   recurrent left  . PENILE PROSTHESIS IMPLANT  2000s   [facility], Dr Brunilda Payor  . RETINAL DETACHMENT SURGERY  1970  . Rotator Cuff Repair  2009   Left side, Dr Elita Quick   . ROTATOR CUFF REPAIR  2010-2011?   right side    Social History: Social History   Tobacco Use  . Smoking status: Former Smoker    Quit date: 02/14/1984    Years since quitting: 34.8  . Smokeless tobacco: Never Used  Substance Use Topics  . Alcohol use: No    Comment: Quit in 10/1983  . Drug use: No   Additional social history: Lives with wife who is also legally blind. Please also refer to relevant sections of EMR.  Family History: Family  History  Problem Relation Age of Onset  . Heart disease Mother   . Breast cancer Sister    (If not completed, MUST add something in)  Allergies and Medications: No Known Allergies No current facility-administered medications on file prior to encounter.    Current Outpatient Medications on File Prior to Encounter  Medication Sig Dispense Refill  . augmented betamethasone dipropionate (DIPROLENE-AF) 0.05 % cream APPLY TOPICALLY AS DIRECTED TWO TIMES DAILY 100 g 1  . enalapril (VASOTEC) 20 MG tablet TAKE 1 TABLET BY MOUTH  DAILY 90 tablet 3  . glipiZIDE (GLUCOTROL) 10 MG tablet TAKE 1 TABLET BY MOUTH ONCE A DAY BEFORE NIGHT TIME MEAL 180 tablet 3  . glucose blood (PRODIGY NO CODING BLOOD GLUC) test strip USE TO CHECK BLOOD SUGAR  THREE TIMES DAILY OR AS  DIRECTED 300 strip 3  . JARDIANCE 10 MG TABS tablet TAKE 1 TABLET BY MOUTH  DAILY 90 tablet 3  . metoprolol succinate (TOPROL-XL) 25 MG 24 hr tablet TAKE ONE-HALF TABLET BY  MOUTH DAILY 45 tablet 3  . Prodigy Twist Top Lancets 28G MISC CHECK BLOOD SUGAR 3 TIMES  DAILY 300 each 3  . simvastatin (  ZOCOR) 40 MG tablet TAKE 1 TABLET BY MOUTH AT  BEDTIME 90 tablet 3  . TRADJENTA 5 MG TABS tablet TAKE 1 TABLET BY MOUTH  DAILY 90 tablet 3  . triamcinolone cream (KENALOG) 0.1 % APPLY TO AFFECTED AREA(S)  TWO TIMES DAILY 400 g 1  . triamcinolone cream (KENALOG) 0.1 % APPLY TO AFFECTED AREA(S)  TOPICALLY TWO TIMES DAILY 400 g 1  . triamcinolone cream (KENALOG) 0.1 % APPLY TO AFFECTED AREA(S)  TOPICALLY TWICE DAILY 400 g 0    Objective: BP (!) 161/76   Pulse 76   Temp 99 F (37.2 C) (Oral)   Resp (!) 28   Ht  (1.956 m)   Wt 104.3 kg   SpO2 98%   BMI 27.27 kg/m  Exam: General: 68 year old gentleman in no acute distress Eyes: Legally blind ENTM: Mucous brains moist Neck: Supple, no lymphadenopathy Cardiovascular: Regular rate and rhythm, no murmurs appreciated, no rubs or gallops Respiratory: Chest clear to auscultation bilaterally,  no crackles, no rhonchi, no increased work of breathing Gastrointestinal: Soft, nontender, nondistended, bowel sounds present MSK: Moving all extremities.  Left lower extremity edematous to knee.  Ulcer base of foot Derm: Warm and dry Neuro: Alert and orientated x4 Psych: Pleasant and cooperative, obeys commands and answers questions appropriately.      Labs and Imaging: CBC BMET  Recent Labs  Lab 12/26/18 0311  WBC 17.0*  HGB 10.2*  HCT 34.5*  PLT 274   Recent Labs  Lab 12/26/18 0311  NA 141  K 4.1  CL 110  CO2 17*  BUN 33*  CREATININE 1.54*  GLUCOSE 170*  CALCIUM 8.5*       Dg Chest 2 View  Result Date: 12/25/2018 CLINICAL DATA:  Pt arrives via [facility]. C/o sore on the left foot (diabetic ulcer) for about 2-3 weeks. Increased pain with ambulation. Fever for 3 days. Has taken tylenol for the same. EXAM: CHEST - 2 VIEW COMPARISON:  Chest radiograph 12/10/2009 FINDINGS: Stable cardiomediastinal contours. The lungs are clear. No pneumothorax or pleural effusion. No acute finding in the visualized skeleton. IMPRESSION: No evidence of active disease in the chest. Electronically Signed   By: Emmaline Kluver M.D.   On: 12/25/2018 19:08   Dg Foot Complete Left  Result Date: 12/25/2018 CLINICAL DATA:  Left foot infection EXAM: LEFT FOOT - COMPLETE 3+ VIEW COMPARISON:  None. FINDINGS: Soft tissue ulceration is noted along the volar aspect at the head of the third metatarsal. Subcortical lucency of third metatarsal head raises some suspicion for early features of osteomyelitis. No destructive change or periostitis is present at this time. Questionable fragmentation at the tuft of the fifth distal phalanx. No other acute bony abnormality. 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 IMPRESSION: 1. Soft tissue ulceration and gas along the volar aspect at the head of the third metatarsal. Subcortical lucency of the third metatarsal head raises suspicion  for early features of osteomyelitis. 2. Questionable fragmentation at the tuft of the fifth distal phalanx, correlate with point tenderness to assess for acute fracture. Electronically Signed   By: Kreg Shropshire M.D.   On: 12/25/2018 19:13    Dana Allan, MD 12/26/2018, 5:37 AM PGY-1, [facility] FPTS Intern pager: [phone], text pages welcome  FPTS Upper-Level Resident Addendum   I have independently interviewed and examined the patient. I have discussed the above with the original author and agree with their documentation. My edits for correction/addition/clarification are  in blue. Please see also any attending notes.    Kathrene Alu, MD PGY-3, [facility] 12/26/2018 6:03 AM  FPTS Service pager: [phone] (text pages welcome through [facility])

## 2018-12-26 NOTE — ED Provider Notes (Signed)
[facility] EMERGENCY DEPARTMENT Provider Note   CSN: [id number] [facility]  1803     History   Chief Complaint Chief Complaint  Patient presents with  . Wound Infection    HPI Chad Hall is a 68 y.o. male.     The history is provided by the patient and medical records.     68 year old male with history of seasonal allergies, diabetes, hyperlipidemia, hypertension, Marfan syndrome, blindness in both eyes from remote basketball injury, presenting to the ED with foot wound.  States 3 weeks ago he felt a callus on the bottom of his left foot and pulled it off.  States after that noticing some increased pain and felt some drainage in his sock.  States he has not had anyone to help him care for this.  States 3 days ago he started running a fever so decided to get evaluated.  He denies history of significant diabetic foot wounds in the past.  Is unsure how his sugars have been running lately.  He has not had any cough, shortness of breath, or other upper respiratory symptoms.  Past Medical History:  Diagnosis Date  . Allergy   . Blindness - both eyes 1967   Basketball injury  . Diabetes mellitus   . Hernia   . Hyperlipidemia   . Hypertension   . Marfan syndrome     Patient Active Problem List   Diagnosis Date Noted  . Right knee pain 10/04/2017  . Left hip pain 04/10/2013  . Diabetic peripheral neuropathy associated with type 2 diabetes mellitus (HCC) 11/10/2012  . Marfan's syndrome 08/08/2012  . Healthcare maintenance 08/08/2012  . Atopic dermatitis 02/16/2012  . Overweight (BMI 25.0-29.9) 07/05/2011  . Allergic rhinitis due to allergen 07/03/2011  . Venous insufficiency of leg 04/03/2011  . Blindness 07/23/2010  . DM (diabetes mellitus), type 2 with neurological complications (HCC) 06/13/2010  . Hypertension 06/13/2010  . Hyperlipidemia 06/13/2010    Past Surgical History:  Procedure Laterality Date  . APPENDECTOMY    .  INGUINAL HERNIA REPAIR     Right 1970s,    Left 2011 by Dr Andrey CampanileWilson  . INGUINAL HERNIA REPAIR  08/25/10   recurrent left  . PENILE PROSTHESIS IMPLANT  2000s   [facility], Dr Brunilda PayorNesi  . RETINAL DETACHMENT SURGERY  1970  . Rotator Cuff Repair  2009   Left side, Dr Elita Quickowen   . ROTATOR CUFF REPAIR  2010-2011?   right side        Home Medications    Prior to Admission medications   Medication Sig Start Date End Date Taking? Authorizing Provider  augmented betamethasone dipropionate (DIPROLENE-AF) 0.05 % cream APPLY TOPICALLY AS DIRECTED TWO TIMES DAILY 12/14/18   Lockamy, Timothy, DO  enalapril (VASOTEC) 20 MG tablet TAKE 1 TABLET BY MOUTH  DAILY 07/04/18   Lockamy, Timothy, DO  glipiZIDE (GLUCOTROL) 10 MG tablet TAKE 1 TABLET BY MOUTH ONCE A DAY BEFORE NIGHT TIME MEAL 09/26/18   Lockamy, Timothy, DO  glucose blood (PRODIGY NO CODING BLOOD GLUC) test strip USE TO CHECK BLOOD SUGAR  THREE TIMES DAILY OR AS  DIRECTED 09/21/18   Arlyce HarmanLockamy, Timothy, DO  JARDIANCE 10 MG TABS tablet TAKE 1 TABLET BY MOUTH  DAILY 09/19/18   Arlyce HarmanLockamy, Timothy, DO  metoprolol succinate (TOPROL-XL) 25 MG 24 hr tablet TAKE ONE-HALF TABLET BY  MOUTH DAILY 09/19/18   Arlyce HarmanLockamy, Timothy, DO  Prodigy Twist Top Lancets 28G MISC CHECK BLOOD SUGAR 3 TIMES  DAILY 07/21/18   Nuala Alpha, DO  simvastatin (ZOCOR) 40 MG tablet TAKE 1 TABLET BY MOUTH AT  BEDTIME 07/21/18   Nuala Alpha, DO  TRADJENTA 5 MG TABS tablet TAKE 1 TABLET BY MOUTH  DAILY 09/19/18   Nuala Alpha, DO  triamcinolone cream (KENALOG) 0.1 % APPLY TO AFFECTED AREA(S)  TWO TIMES DAILY 07/19/17   Nuala Alpha, DO  triamcinolone cream (KENALOG) 0.1 % APPLY TO AFFECTED AREA(S)  TOPICALLY TWO TIMES DAILY 04/08/18   Nuala Alpha, DO  triamcinolone cream (KENALOG) 0.1 % APPLY TO AFFECTED AREA(S)  TOPICALLY TWICE DAILY 12/14/18   Nuala Alpha, DO    Family History Family History  Problem Relation Age of Onset  . Heart disease Mother   . Breast cancer Sister      Social History Social History   Tobacco Use  . Smoking status: Former Smoker    Quit date: 02/14/1984    Years since quitting: 34.8  . Smokeless tobacco: Never Used  Substance Use Topics  . Alcohol use: No    Comment: Quit in 10/1983  . Drug use: No     Allergies   Patient has no known allergies.   Review of Systems Review of Systems  Skin: Positive for wound.  All other systems reviewed and are negative.    Physical Exam Updated Vital Signs BP (!) 154/80 (BP Location: Right Arm)   Pulse 82   Temp 99 F (37.2 C) (Oral)   Resp 18   SpO2 98%   Physical Exam Vitals signs and nursing note reviewed.  Constitutional:      Appearance: He is well-developed.  HENT:     Head: Normocephalic and atraumatic.  Eyes:     Pupils: Pupils are equal, round, and reactive to light.     Comments: Blind, wearing sunglasses  Neck:     Musculoskeletal: Normal range of motion.  Cardiovascular:     Rate and Rhythm: Normal rate and regular rhythm.     Heart sounds: Normal heart sounds.  Pulmonary:     Effort: Pulmonary effort is normal.     Breath sounds: Normal breath sounds.  Abdominal:     General: Bowel sounds are normal.     Palpations: Abdomen is soft.  Musculoskeletal: Normal range of motion.     Comments: Left foot with circular ulceration to sole of the foot just proximal to 3rd and 4th toes, size is approx 1-1.5 inch in diameter, wound hollowed out centrally to about half inch depth, there is some foul smell drainage, some surrounding edema noted, no tissue crepitus  Skin:    General: Skin is warm and dry.  Neurological:     Mental Status: He is alert and oriented to person, place, and time.      ED Treatments / Results  Labs (all labs ordered are listed, but only abnormal results are displayed) Labs Reviewed  COMPREHENSIVE METABOLIC PANEL - Abnormal; Notable for the following components:      Result Value   CO2 21 (*)    Glucose, Bld 216 (*)    BUN 27 (*)     Creatinine, Ser 1.63 (*)    Albumin 2.9 (*)    Total Bilirubin 1.3 (*)    GFR calc non Af Amer 42 (*)    GFR calc Af Amer 49 (*)    All other components within normal limits  CBC WITH DIFFERENTIAL/PLATELET - Abnormal; Notable for the following components:   WBC 15.8 (*)    RBC  3.91 (*)    Hemoglobin 11.4 (*)    HCT 38.1 (*)    MCHC 29.9 (*)    Neutro Abs 13.3 (*)    Monocytes Absolute 1.5 (*)    Abs Immature Granulocytes 0.08 (*)    All other components within normal limits  URINALYSIS, ROUTINE W REFLEX MICROSCOPIC - Abnormal; Notable for the following components:   Specific Gravity, Urine 1.031 (*)    Glucose, UA >=500 (*)    Hgb urine dipstick SMALL (*)    All other components within normal limits  CULTURE, BLOOD (ROUTINE X 2)  CULTURE, BLOOD (ROUTINE X 2)  AEROBIC CULTURE (SUPERFICIAL SPECIMEN)  SARS CORONAVIRUS 2 (TAT 6-24 HRS)  LACTIC ACID, PLASMA  LACTIC ACID, PLASMA  PROTIME-INR    EKG None  Radiology Dg Chest 2 View  Result Date: 12/25/2018 CLINICAL DATA:  Pt arrives via [facility]. C/o sore on the left foot (diabetic ulcer) for about 2-3 weeks. Increased pain with ambulation. Fever for 3 days. Has taken tylenol for the same. EXAM: CHEST - 2 VIEW COMPARISON:  Chest radiograph 12/10/2009 FINDINGS: Stable cardiomediastinal contours. The lungs are clear. No pneumothorax or pleural effusion. No acute finding in the visualized skeleton. IMPRESSION: No evidence of active disease in the chest. Electronically Signed   By: Emmaline Kluver M.D.   On: 12/25/2018 19:08   Dg Foot Complete Left  Result Date: 12/25/2018 CLINICAL DATA:  Left foot infection EXAM: LEFT FOOT - COMPLETE 3+ VIEW COMPARISON:  None. FINDINGS: Soft tissue ulceration is noted along the volar aspect at the head of the third metatarsal. Subcortical lucency of third metatarsal head raises some suspicion for early features of osteomyelitis. No destructive change or periostitis is present at this time.  Questionable fragmentation at the tuft of the fifth distal phalanx. No other acute bony abnormality. 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 IMPRESSION: 1. Soft tissue ulceration and gas along the volar aspect at the head of the third metatarsal. Subcortical lucency of the third metatarsal head raises suspicion for early features of osteomyelitis. 2. Questionable fragmentation at the tuft of the fifth distal phalanx, correlate with point tenderness to assess for acute fracture. Electronically Signed   By: Kreg Shropshire M.D.   On: 12/25/2018 19:13    Procedures Procedures (including critical care time)  CRITICAL CARE Performed by: Garlon Hatchet   Total critical care time: 35 minutes  Critical care time was exclusive of separately billable procedures and treating other patients.  Critical care was necessary to treat or prevent imminent or life-threatening deterioration.  Critical care was time spent personally by me on the following activities: development of treatment plan with patient and/or surrogate as well as nursing, discussions with consultants, evaluation of patient's response to treatment, examination of patient, obtaining history from patient or surrogate, ordering and performing treatments and interventions, ordering and review of laboratory studies, ordering and review of radiographic studies, pulse oximetry and re-evaluation of patient's condition.   Medications Ordered in ED Medications  sodium chloride flush (NS) 0.9 % injection 3 mL (has no administration in time range)  vancomycin (VANCOCIN) 2,000 mg in sodium chloride 0.9 % 500 mL IVPB (has no administration in time range)  piperacillin-tazobactam (ZOSYN) IVPB 3.375 g (has no administration in time range)  acetaminophen (TYLENOL) tablet 650 mg (650 mg Oral Given 12/25/18 1825)     Initial Impression / Assessment and Plan / ED Course  I have reviewed the triage vital signs and  the  nursing notes.  Pertinent labs & imaging results that were available during my care of the patient were reviewed by me and considered in my medical decision making (see chart for details).  68 y.o. M here with new osteomyelitis of left foot.  Had callus of left foot 3 weeks ago and pulled it off, has since had increasing pain and drainage.  Fever started 3 days ago, Tmax 103.38F here.  Patient afebrile by time of my evaluation after Tylenol in triage, he is nontoxic in appearance.  He does have ulceration to the sole of left foot just proximal to the third and fourth toes.  This is hollowed out centrally to about half inch with some foul-smelling, purulent drainage.  Wound culture was obtained.  Labs as above, leukocytosis but normal lactate.  Blood cultures are pending.  X-ray is concerning for new osteomyelitis of the left foot.  I discussed the results with patient, he acknowledged understanding.  She will require admission, likely will need MRI and IV antibiotics.  Discussed with orthopedics, Dr. Roswell Miners with IV antibiotics and MRI.  They will see patient in the morning and provide further recommendations.  Patient will be admitted to family practice service for ongoing management.  Final Clinical Impressions(s) / ED Diagnoses   Final diagnoses:  Other osteomyelitis of left foot [facility])    ED Discharge Orders    None       Garlon Hatchet, PA-C 12/26/18 0146    Zadie Rhine, MD 12/26/18 [phone]

## 2018-12-26 NOTE — ED Notes (Signed)
Please the patient that his sister Vaughan Basta called and that she's thinking of him

## 2018-12-26 NOTE — ED Notes (Signed)
Patient CBG was 122. 

## 2018-12-26 NOTE — H&P (View-Only) (Signed)
ORTHOPAEDIC CONSULTATION  REQUESTING PHYSICIAN: Chad Chandler, MD  Chief Complaint: Abscess ulceration left foot third metatarsal head.  HPI: Chad Hall is a 68 y.o. male who presents with a chronic ulcer beneath the third metatarsal head with purulent drainage odor and pain.  Past Medical History:  Diagnosis Date  . Allergy   . Blindness - both eyes 1967   Basketball injury  . Diabetes mellitus   . Hernia   . Hyperlipidemia   . Hypertension   . Marfan syndrome    Past Surgical History:  Procedure Laterality Date  . APPENDECTOMY    . INGUINAL HERNIA REPAIR     Right 1970s,    Left 2011 by Dr Andrey Campanile  . INGUINAL HERNIA REPAIR  08/25/10   recurrent left  . PENILE PROSTHESIS IMPLANT  2000s   [facility], Dr Brunilda Payor  . RETINAL DETACHMENT SURGERY  1970  . Rotator Cuff Repair  2009   Left side, Dr Elita Quick   . ROTATOR CUFF REPAIR  2010-2011?   right side   Social History   Socioeconomic History  . Marital status: Married    Spouse name: [name]  . Number of children: 0  . Years of education: 12th   . Highest education level: Not on file  Occupational History  . Occupation: Scientist, product/process development: [employer]  Social Needs  . Financial resource strain: Not on file  . Food insecurity    Worry: Not on file    Inability: Not on file  . Transportation needs    Medical: Not on file    Non-medical: Not on file  Tobacco Use  . Smoking status: Former Smoker    Quit date: 02/14/1984    Years since quitting: 34.8  . Smokeless tobacco: Never Used  Substance and Sexual Activity  . Alcohol use: No    Comment: Quit in 10/1983  . Drug use: No  . Sexual activity: Not on file  Lifestyle  . Physical activity    Days per week: Not on file    Minutes per session: Not on file  . Stress: Not on file  Relationships  . Social Musician on phone: Not on file    Gets together: Not on file    Attends religious service: Not on file     Active member of club or organization: Not on file    Attends meetings of clubs or organizations: Not on file    Relationship status: Not on file  Other Topics Concern  . Not on file  Social History Narrative   Lives in [place] with wife, CASY BRUNETTO   Blind.  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   Exercise: Pt has no regular exercise routine.   Seatbelts: Pt reports wearing seatbelt when in vehicles.    Wynelle Link Exposure/Protection:    Hobbies: Proofreader, walking, sports         Family History  Problem Relation Age of Onset  . Heart disease Mother   . Breast cancer Sister    - negative except otherwise  stated in the family history section No Known Allergies Prior to Admission medications   Medication Sig Start Date End Date Taking? Authorizing Provider  cholecalciferol (VITAMIN D3) 25 MCG (1000 UT) tablet Take 1,000 Units by mouth at bedtime.   Yes [provider]  enalapril (VASOTEC) 20 MG tablet TAKE 1 TABLET BY MOUTH  DAILY Patient taking differently: Take 20 mg by mouth at bedtime.  07/04/18  Yes Lockamy, Timothy, DO  glipiZIDE (GLUCOTROL) 10 MG tablet TAKE 1 TABLET BY MOUTH ONCE A DAY BEFORE NIGHT TIME MEAL Patient taking differently: Take 10 mg by mouth at bedtime.  09/26/18  Yes Lockamy, Timothy, DO  JARDIANCE 10 MG TABS tablet TAKE 1 TABLET BY MOUTH  DAILY Patient taking differently: Take 10 mg by mouth daily.  09/19/18  Yes Nuala Alpha, DO  meloxicam (MOBIC) 15 MG tablet Take 15 mg by mouth daily as needed for pain.  12/20/18  Yes [provider]  metoprolol succinate (TOPROL-XL) 25 MG 24 hr tablet TAKE ONE-HALF TABLET BY  MOUTH DAILY Patient taking  differently: Take 12.5 mg by mouth every evening.  09/19/18  Yes Lockamy, Timothy, DO  simvastatin (ZOCOR) 40 MG tablet TAKE 1 TABLET BY MOUTH AT  BEDTIME Patient taking differently: Take 40 mg by mouth at bedtime.  07/21/18  Yes Lockamy, Timothy, DO  TRADJENTA 5 MG TABS tablet TAKE 1 TABLET BY MOUTH  DAILY Patient taking differently: Take 5 mg by mouth at bedtime.  09/19/18  Yes Lockamy, Timothy, DO  glucose blood (PRODIGY NO CODING BLOOD GLUC) test strip USE TO CHECK BLOOD SUGAR  THREE TIMES DAILY OR AS  DIRECTED 09/21/18   Nuala Alpha, DO  Prodigy Twist Top Lancets 28G MISC CHECK BLOOD SUGAR 3 TIMES  DAILY 07/21/18   Nuala Alpha, DO   Dg Chest 2 View  Result Date: 12/25/2018 CLINICAL DATA:  Pt arrives via [facility]. C/o sore on the left foot (diabetic ulcer) for about 2-3 weeks. Increased pain with ambulation. Fever for 3 days. Has taken tylenol for the same. EXAM: CHEST - 2 VIEW COMPARISON:  Chest radiograph 12/10/2009 FINDINGS: Stable cardiomediastinal contours. The lungs are clear. No pneumothorax or pleural effusion. No acute finding in the visualized skeleton. IMPRESSION: No evidence of active disease in the chest. Electronically Signed   By: Audie Pinto M.D.   On: 12/25/2018 19:08   Mr Foot Left W Wo Contrast  Result Date: 12/26/2018 CLINICAL DATA:  Foot swelling. EXAM: MRI OF THE LEFT FOREFOOT WITHOUT AND WITH CONTRAST TECHNIQUE: Multiplanar, multisequence MR imaging of the left forefoot was performed both before and after administration of intravenous contrast. CONTRAST:  39mL GADAVIST GADOBUTROL 1 MMOL/ML IV SOLN COMPARISON:  Radiographs dated 12/25/2018 FINDINGS: Bones/Joint/Cartilage There is irregular enhancement of the head of the third metatarsal with subtle decreased signal in the same area on T1 weighted imaging. This is consistent with osteomyelitis of the third metatarsal head. There is slight enhancement of the base of the proximal phalanx of the third toe with edema in the  proximal phalanx. There is an effusion at the third MTP joint with gas in the fluid. Gas and fluid extends proximally along the extensor and flexor tendon sheaths of the third ray best seen on image 19 of series 4. No other significant bone abnormality. Specifically, the distal phalanx of the little toe appears normal. Muscles and Tendons Infectious tenosynovitis involving the flexor and extensor tendons of the third ray as described above. Gas in the fluid surrounding the tendons adjacent to  the third metatarsal. Soft tissues Soft tissue ulceration on the plantar aspect of the foot with the level of the head of the third metatarsal. Enhancement of the adjacent soft tissues after contrast administration consistent with cellulitis. IMPRESSION: 1. Osteomyelitis of the head of the third metatarsal. Infection of the third MTP joint. 2. Infectious tenosynovitis of the flexor and extensor tendons of the third ray. 3. Soft tissue ulceration on the plantar aspect of the foot at the level of the head of the third metatarsal. Electronically Signed   By: Francene BoyersJames  Maxwell M.D.   On: 12/26/2018 11:05   Dg Foot Complete Left  Result Date: 12/25/2018 CLINICAL DATA:  Left foot infection EXAM: LEFT FOOT - COMPLETE 3+ VIEW COMPARISON:  None. FINDINGS: Soft tissue ulceration is noted along the volar aspect at the head of the third metatarsal. Subcortical lucency of third metatarsal head raises some suspicion for early features of osteomyelitis. No destructive change or periostitis is present at this time. Questionable fragmentation at the tuft of the fifth distal phalanx. No other acute bony abnormality. 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 IMPRESSION: 1. Soft tissue ulceration and gas along the volar aspect at the head of the third metatarsal. Subcortical lucency of the third metatarsal head raises suspicion for early features of osteomyelitis. 2. Questionable fragmentation at the tuft of  the fifth distal phalanx, correlate with point tenderness to assess for acute fracture. Electronically Signed   By: Kreg ShropshirePrice  DeHay M.D.   On: 12/25/2018 19:13   - pertinent xrays, CT, MRI studies were reviewed and independently interpreted  Positive ROS: All other systems have been reviewed and were otherwise negative with the exception of those mentioned in the HPI and as above.  Physical Exam: General: Alert, no acute distress Psychiatric: Patient is competent for consent with normal mood and affect Lymphatic: No axillary or cervical lymphadenopathy Cardiovascular: No pedal edema Respiratory: No cyanosis, no use of accessory musculature GI: No organomegaly, abdomen is soft and non-tender    Images:  @ENCIMAGES @  Labs:  Lab Results  Component Value Date   HGBA1C 6.4 (H) 12/26/2018   HGBA1C 6.5 09/26/2018   HGBA1C 6.9 04/08/2018   REPTSTATUS PENDING 12/26/2018   GRAMSTAIN  12/26/2018    RARE WBC PRESENT, PREDOMINANTLY PMN MODERATE GRAM POSITIVE COCCI IN PAIRS MODERATE GRAM NEGATIVE RODS Performed at [facility], [address] [place], [place] [address]    CULT PENDING 12/26/2018    Lab Results  Component Value Date   ALBUMIN 2.9 (L) 12/25/2018   ALBUMIN 4.3 01/06/2017   ALBUMIN 4.1 05/17/2015    Neurologic: Patient does not have protective sensation bilateral lower extremities.   MUSCULOSKELETAL:   Skin: Examination patient has a open wound with purulent drainage beneath the third metatarsal head.  The ulcer probes to bone.  Patient has a good dorsalis pedis pulse.  No ascending cellulitis.  Assessment: Assessment: Diabetic insensate neuropathy with protein caloric malnutrition with abscess and osteomyelitis third metatarsal head left foot.  Plan: Plan: We will plan for a left foot third ray amputation on Wednesday.  Continue IV antibiotics.  Thank you for the consult and the opportunity to see Mr. Chauncey ReadingWilliams  Caydance Kuehnle, MD [facility] [phone] 5:52 PM

## 2018-12-26 NOTE — Consult Note (Signed)
ORTHOPAEDIC CONSULTATION  REQUESTING PHYSICIAN: Westley Chandler, MD  Chief Complaint: Abscess ulceration left foot third metatarsal head.  HPI: Chad Hall is a 68 y.o. male who presents with a chronic ulcer beneath the third metatarsal head with purulent drainage odor and pain.  Past Medical History:  Diagnosis Date  . Allergy   . Blindness - both eyes 1967   Basketball injury  . Diabetes mellitus   . Hernia   . Hyperlipidemia   . Hypertension   . Marfan syndrome    Past Surgical History:  Procedure Laterality Date  . APPENDECTOMY    . INGUINAL HERNIA REPAIR     Right 1970s,    Left 2011 by Dr Andrey Campanile  . INGUINAL HERNIA REPAIR  08/25/10   recurrent left  . PENILE PROSTHESIS IMPLANT  2000s   [facility], Dr Brunilda Payor  . RETINAL DETACHMENT SURGERY  1970  . Rotator Cuff Repair  2009   Left side, Dr Elita Quick   . ROTATOR CUFF REPAIR  2010-2011?   right side   Social History   Socioeconomic History  . Marital status: Married    Spouse name: [name]  . Number of children: 0  . Years of education: 12th   . Highest education level: Not on file  Occupational History  . Occupation: Scientist, product/process development: [employer]  Social Needs  . Financial resource strain: Not on file  . Food insecurity    Worry: Not on file    Inability: Not on file  . Transportation needs    Medical: Not on file    Non-medical: Not on file  Tobacco Use  . Smoking status: Former Smoker    Quit date: 02/14/1984    Years since quitting: 34.8  . Smokeless tobacco: Never Used  Substance and Sexual Activity  . Alcohol use: No    Comment: Quit in 10/1983  . Drug use: No  . Sexual activity: Not on file  Lifestyle  . Physical activity    Days per week: Not on file    Minutes per session: Not on file  . Stress: Not on file  Relationships  . Social Musician on phone: Not on file    Gets together: Not on file    Attends religious service: Not on file     Active member of club or organization: Not on file    Attends meetings of clubs or organizations: Not on file    Relationship status: Not on file  Other Topics Concern  . Not on file  Social History Narrative   Lives in [place] with wife, CASY BRUNETTO   Blind.  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   Exercise: Pt has no regular exercise routine.   Seatbelts: Pt reports wearing seatbelt when in vehicles.    Wynelle Link Exposure/Protection:    Hobbies: Proofreader, walking, sports         Family History  Problem Relation Age of Onset  . Heart disease Mother   . Breast cancer Sister    - negative except otherwise  stated in the family history section No Known Allergies Prior to Admission medications   Medication Sig Start Date End Date Taking? Authorizing Provider  cholecalciferol (VITAMIN D3) 25 MCG (1000 UT) tablet Take 1,000 Units by mouth at bedtime.   Yes [provider]  enalapril (VASOTEC) 20 MG tablet TAKE 1 TABLET BY MOUTH  DAILY Patient taking differently: Take 20 mg by mouth at bedtime.  07/04/18  Yes Lockamy, Timothy, DO  glipiZIDE (GLUCOTROL) 10 MG tablet TAKE 1 TABLET BY MOUTH ONCE A DAY BEFORE NIGHT TIME MEAL Patient taking differently: Take 10 mg by mouth at bedtime.  09/26/18  Yes Lockamy, Timothy, DO  JARDIANCE 10 MG TABS tablet TAKE 1 TABLET BY MOUTH  DAILY Patient taking differently: Take 10 mg by mouth daily.  09/19/18  Yes Nuala Alpha, DO  meloxicam (MOBIC) 15 MG tablet Take 15 mg by mouth daily as needed for pain.  12/20/18  Yes [provider]  metoprolol succinate (TOPROL-XL) 25 MG 24 hr tablet TAKE ONE-HALF TABLET BY  MOUTH DAILY Patient taking  differently: Take 12.5 mg by mouth every evening.  09/19/18  Yes Lockamy, Timothy, DO  simvastatin (ZOCOR) 40 MG tablet TAKE 1 TABLET BY MOUTH AT  BEDTIME Patient taking differently: Take 40 mg by mouth at bedtime.  07/21/18  Yes Lockamy, Timothy, DO  TRADJENTA 5 MG TABS tablet TAKE 1 TABLET BY MOUTH  DAILY Patient taking differently: Take 5 mg by mouth at bedtime.  09/19/18  Yes Lockamy, Timothy, DO  glucose blood (PRODIGY NO CODING BLOOD GLUC) test strip USE TO CHECK BLOOD SUGAR  THREE TIMES DAILY OR AS  DIRECTED 09/21/18   Nuala Alpha, DO  Prodigy Twist Top Lancets 28G MISC CHECK BLOOD SUGAR 3 TIMES  DAILY 07/21/18   Nuala Alpha, DO   Dg Chest 2 View  Result Date: 12/25/2018 CLINICAL DATA:  Pt arrives via [facility]. C/o sore on the left foot (diabetic ulcer) for about 2-3 weeks. Increased pain with ambulation. Fever for 3 days. Has taken tylenol for the same. EXAM: CHEST - 2 VIEW COMPARISON:  Chest radiograph 12/10/2009 FINDINGS: Stable cardiomediastinal contours. The lungs are clear. No pneumothorax or pleural effusion. No acute finding in the visualized skeleton. IMPRESSION: No evidence of active disease in the chest. Electronically Signed   By: Audie Pinto M.D.   On: 12/25/2018 19:08   Mr Foot Left W Wo Contrast  Result Date: 12/26/2018 CLINICAL DATA:  Foot swelling. EXAM: MRI OF THE LEFT FOREFOOT WITHOUT AND WITH CONTRAST TECHNIQUE: Multiplanar, multisequence MR imaging of the left forefoot was performed both before and after administration of intravenous contrast. CONTRAST:  39mL GADAVIST GADOBUTROL 1 MMOL/ML IV SOLN COMPARISON:  Radiographs dated 12/25/2018 FINDINGS: Bones/Joint/Cartilage There is irregular enhancement of the head of the third metatarsal with subtle decreased signal in the same area on T1 weighted imaging. This is consistent with osteomyelitis of the third metatarsal head. There is slight enhancement of the base of the proximal phalanx of the third toe with edema in the  proximal phalanx. There is an effusion at the third MTP joint with gas in the fluid. Gas and fluid extends proximally along the extensor and flexor tendon sheaths of the third ray best seen on image 19 of series 4. No other significant bone abnormality. Specifically, the distal phalanx of the little toe appears normal. Muscles and Tendons Infectious tenosynovitis involving the flexor and extensor tendons of the third ray as described above. Gas in the fluid surrounding the tendons adjacent to  the third metatarsal. Soft tissues Soft tissue ulceration on the plantar aspect of the foot with the level of the head of the third metatarsal. Enhancement of the adjacent soft tissues after contrast administration consistent with cellulitis. IMPRESSION: 1. Osteomyelitis of the head of the third metatarsal. Infection of the third MTP joint. 2. Infectious tenosynovitis of the flexor and extensor tendons of the third ray. 3. Soft tissue ulceration on the plantar aspect of the foot at the level of the head of the third metatarsal. Electronically Signed   By: James  Maxwell M.D.   On: 12/26/2018 11:05   Dg Foot Complete Left  Result Date: 12/25/2018 CLINICAL DATA:  Left foot infection EXAM: LEFT FOOT - COMPLETE 3+ VIEW COMPARISON:  None. FINDINGS: Soft tissue ulceration is noted along the volar aspect at the head of the third metatarsal. Subcortical lucency of third metatarsal head raises some suspicion for early features of osteomyelitis. No destructive change or periostitis is present at this time. Questionable fragmentation at the tuft of the fifth distal phalanx. No other acute bony abnormality. 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 IMPRESSION: 1. Soft tissue ulceration and gas along the volar aspect at the head of the third metatarsal. Subcortical lucency of the third metatarsal head raises suspicion for early features of osteomyelitis. 2. Questionable fragmentation at the tuft of  the fifth distal phalanx, correlate with point tenderness to assess for acute fracture. Electronically Signed   By: Price  DeHay M.D.   On: 12/25/2018 19:13   - pertinent xrays, CT, MRI studies were reviewed and independently interpreted  Positive ROS: All other systems have been reviewed and were otherwise negative with the exception of those mentioned in the HPI and as above.  Physical Exam: General: Alert, no acute distress Psychiatric: Patient is competent for consent with normal mood and affect Lymphatic: No axillary or cervical lymphadenopathy Cardiovascular: No pedal edema Respiratory: No cyanosis, no use of accessory musculature GI: No organomegaly, abdomen is soft and non-tender    Images:  @ENCIMAGES@  Labs:  Lab Results  Component Value Date   HGBA1C 6.4 (H) 12/26/2018   HGBA1C 6.5 09/26/2018   HGBA1C 6.9 04/08/2018   REPTSTATUS PENDING 12/26/2018   GRAMSTAIN  12/26/2018    RARE WBC PRESENT, PREDOMINANTLY PMN MODERATE GRAM POSITIVE COCCI IN PAIRS MODERATE GRAM NEGATIVE RODS Performed at [facility], [address] [place], [place] [address]    CULT PENDING 12/26/2018    Lab Results  Component Value Date   ALBUMIN 2.9 (L) 12/25/2018   ALBUMIN 4.3 01/06/2017   ALBUMIN 4.1 05/17/2015    Neurologic: Patient does not have protective sensation bilateral lower extremities.   MUSCULOSKELETAL:   Skin: Examination patient has a open wound with purulent drainage beneath the third metatarsal head.  The ulcer probes to bone.  Patient has a good dorsalis pedis pulse.  No ascending cellulitis.  Assessment: Assessment: Diabetic insensate neuropathy with protein caloric malnutrition with abscess and osteomyelitis third metatarsal head left foot.  Plan: Plan: We will plan for a left foot third ray amputation on Wednesday.  Continue IV antibiotics.  Thank you for the consult and the opportunity to see Mr. Kratky  Brett Soza, MD [facility] [phone] 5:52 PM      

## 2018-12-26 NOTE — ED Notes (Signed)
MRI called and they were able to find out  Pt can go to MRI

## 2018-12-26 NOTE — ED Notes (Signed)
MRI called requesting info on pt's penile implant.  He does not remember brand and does not have a card, but knows surgery was completed by Dr Janit Bern about 10 years ago.  MRI will continue to search chart.

## 2018-12-26 NOTE — ED Notes (Signed)
Pt's CBG result was 123. Informed Brandi - RN.

## 2018-12-26 NOTE — ED Notes (Signed)
Pt going to MRI now

## 2018-12-26 NOTE — Progress Notes (Signed)
  Echocardiogram 2D Echocardiogram has been performed.  Chad Hall 12/26/2018, 3:54 PM

## 2018-12-26 NOTE — ED Notes (Signed)
No Diet was ordered for Lunch, Patient NPO at this time.

## 2018-12-26 NOTE — ED Notes (Signed)
Surgery Provider at bedside. 

## 2018-12-26 NOTE — Consult Note (Signed)
Reason for Consult:Left foot ulcer Referring Physician: [name] is an 68 y.o. male.  HPI: Chad Hall comes to the ED with a 3-4 week hx/o left foot ulcer and pain. He had not seen anyone about it yet. It kept getting worse and so he decided to seek care. He has had some subjective fevers but denies chills, sweats, N/V. No prior hx/o foot ulcers.  Past Medical History:  Diagnosis Date  . Allergy   . Blindness - both eyes 1967   Basketball injury  . Diabetes mellitus   . Hernia   . Hyperlipidemia   . Hypertension   . Marfan syndrome     Past Surgical History:  Procedure Laterality Date  . APPENDECTOMY    . INGUINAL HERNIA REPAIR     Right 1970s,    Left 2011 by Dr Redmond Pulling  . INGUINAL HERNIA REPAIR  08/25/10   recurrent left  . [facility], Dr Janice Norrie  . RETINAL DETACHMENT SURGERY  1970  . Rotator Cuff Repair  2009   Left side, Dr Hal Morales   . ROTATOR CUFF REPAIR  2010-2011?   right side    Family History  Problem Relation Age of Onset  . Heart disease Mother   . Breast cancer Sister     Social History:  reports that he quit smoking about 34 years ago. He has never used smokeless tobacco. He reports that he does not drink alcohol or use drugs.  Allergies: No Known Allergies  Medications: I have reviewed the patient's current medications.  Results for orders placed or performed during the hospital encounter of 12/25/18 (from the past 48 hour(s))  Urinalysis, Routine w reflex microscopic     Status: Abnormal   Collection Time: 12/25/18  6:15 PM  Result Value Ref Range   Color, Urine YELLOW YELLOW   APPearance CLEAR CLEAR   Specific Gravity, Urine 1.031 (H) 1.005 - 1.030   pH 5.0 5.0 - 8.0   Glucose, UA >=500 (A) NEGATIVE mg/dL   Hgb urine dipstick SMALL (A) NEGATIVE   Bilirubin Urine NEGATIVE NEGATIVE   Ketones, ur NEGATIVE NEGATIVE mg/dL   Protein, ur NEGATIVE NEGATIVE mg/dL   Nitrite NEGATIVE NEGATIVE    Leukocytes,Ua NEGATIVE NEGATIVE   RBC / HPF 0-5 0 - 5 RBC/hpf   WBC, UA 0-5 0 - 5 WBC/hpf   Bacteria, UA NONE SEEN NONE SEEN   Mucus PRESENT     Comment: Performed at [facility], [address], [place], [place] [address]  Culture, blood (Routine x 2)     Status: None (Preliminary result)   Collection Time: 12/25/18  6:16 PM   Specimen: BLOOD  Result Value Ref Range   Specimen Description BLOOD RIGHT ANTECUBITAL    Special Requests      BOTTLES DRAWN AEROBIC AND ANAEROBIC Blood Culture adequate volume   Culture      NO GROWTH < 24 HOURS Performed at [facility], [place] [address], [place], [place] [address]    Report Status PENDING   Lactic acid, plasma     Status: None   Collection Time: 12/25/18  6:19 PM  Result Value Ref Range   Lactic Acid, Venous 1.2 0.5 - 1.9 mmol/L    Comment: Performed at [facility] [address], [place], [place] [address]  Comprehensive metabolic panel     Status: Abnormal   Collection Time: 12/25/18  6:19 PM  Result  Value Ref Range   Sodium 139 135 - 145 mmol/L   Potassium 4.1 3.5 - 5.1 mmol/L   Chloride 107 98 - 111 mmol/L   CO2 21 (L) 22 - 32 mmol/L   Glucose, Bld 216 (H) 70 - 99 mg/dL   BUN 27 (H) 8 - 23 mg/dL   Creatinine, Ser 3.29 (H) 0.61 - 1.24 mg/dL   Calcium 9.0 8.9 - 51.8 mg/dL   Total Protein 7.2 6.5 - 8.1 g/dL   Albumin 2.9 (L) 3.5 - 5.0 g/dL   AST 27 15 - 41 U/L   ALT 36 0 - 44 U/L   Alkaline Phosphatase 54 38 - 126 U/L   Total Bilirubin 1.3 (H) 0.3 - 1.2 mg/dL   GFR calc non Af Amer 42 (L) >60 mL/min   GFR calc Af Amer 49 (L) >60 mL/min   Anion gap 11 5 - 15    Comment: Performed at [facility], [address], [place], [place] [address]  CBC with Differential     Status: Abnormal   Collection Time: 12/25/18  6:19 PM  Result Value Ref Range   WBC 15.8 (H) 4.0 - 10.5 K/uL   RBC 3.91 (L) 4.22 - 5.81 MIL/uL   Hemoglobin 11.4 (L) 13.0 - 17.0 g/dL   HCT 06.3 (L) 01.6 - 01.0 %   MCV 97.4 80.0 - 100.0  fL   MCH 29.2 26.0 - 34.0 pg   MCHC 29.9 (L) 30.0 - 36.0 g/dL   RDW 93.2 35.5 - 73.2 %   Platelets 334 150 - 400 K/uL   nRBC 0.0 0.0 - 0.2 %   Neutrophils Relative % 84 %   Neutro Abs 13.3 (H) 1.7 - 7.7 K/uL   Lymphocytes Relative 6 %   Lymphs Abs 0.9 0.7 - 4.0 K/uL   Monocytes Relative 9 %   Monocytes Absolute 1.5 (H) 0.1 - 1.0 K/uL   Eosinophils Relative 0 %   Eosinophils Absolute 0.1 0.0 - 0.5 K/uL   Basophils Relative 0 %   Basophils Absolute 0.0 0.0 - 0.1 K/uL   Immature Granulocytes 1 %   Abs Immature Granulocytes 0.08 (H) 0.00 - 0.07 K/uL    Comment: Performed at [facility], [address], [place], [place] [address]  Protime-INR     Status: None   Collection Time: 12/25/18  6:24 PM  Result Value Ref Range   Prothrombin Time 14.0 11.4 - 15.2 seconds   INR 1.1 0.8 - 1.2    Comment: (NOTE) INR goal varies based on device and disease states. Performed at [facility], [address] [place], [place] [address]   Culture, blood (Routine x 2)     Status: None (Preliminary result)   Collection Time: 12/25/18  6:24 PM   Specimen: BLOOD  Result Value Ref Range   Specimen Description BLOOD LEFT ANTECUBITAL    Special Requests      BOTTLES DRAWN AEROBIC ONLY Blood Culture adequate volume   Culture      NO GROWTH < 24 HOURS Performed at [facility], [address] [place], [place] [address]    Report Status PENDING   Lactic acid, plasma     Status: None   Collection Time: 12/26/18 12:56 AM  Result Value Ref Range   Lactic Acid, Venous 1.1 0.5 - 1.9 mmol/L    Comment: Performed at [facility], [address] [place], [place] [address]  Wound or Superficial  Culture     Status: None (Preliminary result)   Collection Time: 12/26/18 12:57 AM   Specimen: Wound  Result Value Ref Range   Specimen Description WOUND FOOT LEFT    Special Requests NONE    Gram Stain      RARE WBC PRESENT, PREDOMINANTLY PMN MODERATE GRAM POSITIVE COCCI IN  PAIRS MODERATE GRAM NEGATIVE RODS Performed at [facility], [address] [place], [place] [address]    Culture PENDING    Report Status PENDING   SARS CORONAVIRUS 2 (TAT 6-24 HRS) Nasopharyngeal Nasopharyngeal Swab     Status: None   Collection Time: 12/26/18 12:57 AM   Specimen: Nasopharyngeal Swab  Result Value Ref Range   SARS Coronavirus 2 NEGATIVE NEGATIVE    Comment: (NOTE) SARS-CoV-2 target nucleic acids are NOT DETECTED. The SARS-CoV-2 RNA is generally detectable in upper and lower respiratory specimens during the acute phase of infection. Negative results do not preclude SARS-CoV-2 infection, do not rule out co-infections with other pathogens, and should not be used as the sole basis for treatment or other patient management decisions. Negative results must be combined with clinical observations, patient history, and epidemiological information. The expected result is Negative. Fact Sheet for Patients: HairSlick.nohttps://www.fda.gov/media/138098/download Fact Sheet for Healthcare Providers: quierodirigir.comhttps://www.fda.gov/media/138095/download This test is not yet approved or cleared by the [place] FDA and  has been authorized for detection and/or diagnosis of SARS-CoV-2 by FDA under an Emergency Use Authorization (EUA). This EUA will remain  in effect (meaning this test can be used) for the duration of the COVID-19 declaration under Section 56 4(b)(1) of the Act, 21 U.S.C. section 360bbb-3(b)(1), unless the authorization is terminated or revoked sooner. Performed at [facility], [address] [place], [place] [address]   Hemoglobin A1c     Status: Abnormal   Collection Time: 12/26/18  3:11 AM  Result Value Ref Range   Hgb A1c MFr Bld 6.4 (H) 4.8 - 5.6 %    Comment: (NOTE) Pre diabetes:          5.7%-6.4% Diabetes:              >6.4% Glycemic control for   <7.0% adults with diabetes    Mean Plasma Glucose 136.98 mg/dL    Comment: Performed at [facility], [address] [place], [place] [address]  CBC     Status: Abnormal   Collection Time: 12/26/18  3:11 AM  Result Value Ref Range   WBC 17.0 (H) 4.0 - 10.5 K/uL   RBC 3.48 (L) 4.22 - 5.81 MIL/uL   Hemoglobin 10.2 (L) 13.0 - 17.0 g/dL   HCT 91.434.5 (L) 78.239.0 - 95.652.0 %   MCV 99.1 80.0 - 100.0 fL   MCH 29.3 26.0 - 34.0 pg   MCHC 29.6 (L) 30.0 - 36.0 g/dL   RDW 21.311.6 08.611.5 - 57.815.5 %   Platelets 274 150 - 400 K/uL   nRBC 0.0 0.0 - 0.2 %    Comment: Performed at [facility], [address] [place], [place] [address]  Basic metabolic panel     Status: Abnormal   Collection Time: 12/26/18  3:11 AM  Result Value Ref Range   Sodium 141 135 - 145 mmol/L   Potassium 4.1 3.5 - 5.1 mmol/L   Chloride 110 98 - 111 mmol/L   CO2 17 (L) 22 - 32 mmol/L   Glucose, Bld 170 (H) 70 - 99 mg/dL   BUN 33 (H) 8 -  23 mg/dL   Creatinine, Ser 1.61 (H) 0.61 - 1.24 mg/dL   Calcium 8.5 (L) 8.9 - 10.3 mg/dL   GFR calc non Af Amer 45 (L) >60 mL/min   GFR calc Af Amer 52 (L) >60 mL/min   Anion gap 14 5 - 15    Comment: Performed at [facility], [address] [place], [place] [address]  CBG monitoring, ED     Status: Abnormal   Collection Time: 12/26/18  4:02 AM  Result Value Ref Range   Glucose-Capillary 152 (H) 70 - 99 mg/dL    Dg Chest 2 View  Result Date: 12/25/2018 CLINICAL DATA:  Pt [facility]. C/o sore on the left foot (diabetic ulcer) for about 2-3 weeks. Increased pain with ambulation. Fever for 3 days. Has taken tylenol for the same. EXAM: CHEST - 2 VIEW COMPARISON:  Chest radiograph 12/10/2009 FINDINGS: Stable cardiomediastinal contours. The lungs are clear. No pneumothorax or pleural effusion. No acute finding in the visualized skeleton. IMPRESSION: No evidence of active disease in the chest. Electronically Signed   By: Emmaline Kluver M.D.   On: 12/25/2018 19:08   Dg Foot Complete Left  Result Date: 12/25/2018 CLINICAL DATA:  Left foot infection EXAM: LEFT FOOT - COMPLETE  3+ VIEW COMPARISON:  None. FINDINGS: Soft tissue ulceration is noted along the volar aspect at the head of the third metatarsal. Subcortical lucency of third metatarsal head raises some suspicion for early features of osteomyelitis. No destructive change or periostitis is present at this time. Questionable fragmentation at the tuft of the fifth distal phalanx. No other acute bony abnormality. 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 IMPRESSION: 1. Soft tissue ulceration and gas along the volar aspect at the head of the third metatarsal. Subcortical lucency of the third metatarsal head raises suspicion for early features of osteomyelitis. 2. Questionable fragmentation at the tuft of the fifth distal phalanx, correlate with point tenderness to assess for acute fracture. Electronically Signed   By: Kreg Shropshire M.D.   On: 12/25/2018 19:13    Review of Systems  Constitutional: Positive for fever. Negative for chills and weight loss.  HENT: Negative for ear discharge, ear pain, hearing loss and tinnitus.   Eyes: Negative for blurred vision, double vision, photophobia and pain.  Respiratory: Negative for cough, sputum production and shortness of breath.   Cardiovascular: Negative for chest pain.  Gastrointestinal: Negative for abdominal pain, nausea and vomiting.  Genitourinary: Negative for dysuria, flank pain, frequency and urgency.  Musculoskeletal: Positive for joint pain (Left foot). Negative for back pain, falls, myalgias and neck pain.  Neurological: Negative for dizziness, tingling, sensory change, focal weakness, loss of consciousness and headaches.  Endo/Heme/Allergies: Does not bruise/bleed easily.  Psychiatric/Behavioral: Negative for depression, memory loss and substance abuse. The patient is not nervous/anxious.    Blood pressure (!) 161/79, pulse 91, temperature 99 F (37.2 C), temperature source Oral, resp. rate (!) 23, height  (1.956 m), weight  104.3 kg, SpO2 96 %. Physical Exam  Constitutional: He appears well-developed and well-nourished. No distress.  HENT:  Head: Normocephalic and atraumatic.  Eyes: Conjunctivae are normal. Right eye exhibits no discharge. Left eye exhibits no discharge. No scleral icterus.  Neck: Normal range of motion.  Cardiovascular: Normal rate and regular rhythm.  Respiratory: Effort normal. No respiratory distress.  Musculoskeletal:     Comments: LLE No traumatic wounds, ecchymosis, or rash  Plantar ulceration, mod TTP, malodorous, no active  discharge but stain on sock  No knee or ankle effusion  Knee stable to varus/ valgus and anterior/posterior stress  Sens DPN, SPN, TN intact  Motor EHL, ext, flex, evers 5/5  DP 0, PT 0, 2+ NP edema  Neurological: He is alert.  Skin: Skin is warm and dry. He is not diaphoretic.  Psychiatric: He has a normal mood and affect. His behavior is normal.    Assessment/Plan: Left foot ulcer -- Agree with abx. Will order ABI's. Dr. Lajoyce Corners to evaluate later today or in AM. Multiple medical problems including Marfan syndrome, legally blind, hypertension, type 2 diabetes, and hyperlipidemia -- per primary service    Freeman Caldron, PA-C Orthopedic Surgery [phone] 12/26/2018, 9:13 AM

## 2018-12-26 NOTE — ED Notes (Signed)
ED TO INPATIENT HANDOFF REPORT  ED Nurse Name and Phone #: [phone]  S Name/Age/Gender Chad Hall 68 y.o. male Room/Bed: 052C/052C  Code Status   Code Status: Full Code  Home/SNF/Other Home Patient oriented to: self Is this baseline? Yes   Triage Complete: Triage complete  Chief Complaint sore on foot   Triage Note Pt arrives via [facility]. C/o sore on the left foot (diabetic ulcer) for about 2-3 weeks. Increased pain with ambulation. Fever for 3 days. Has taken tylenol for the same. 188/74, 108, 18, 97% SP O2,   Pt is blind. Wound to the plantar surface of the left foot. CBG 326 for EMS. Pt is blind.    Allergies No Known Allergies  Level of Care/Admitting Diagnosis ED Disposition    ED Disposition Condition Comment   Admit  Hospital Area: [facility] [[id number]]  Level of Care: Med-Surg [16]  Covid Evaluation: Asymptomatic Screening Protocol (No Symptoms)  Diagnosis: Osteomyelitis of foot, left, acute [facility]) [[id number]]  Admitting Physician: Lennox Solders [[id number]]  Attending Physician: Westley Chandler [[id number]]  Estimated length of stay: past midnight tomorrow  Certification:: I certify this patient will need inpatient services for at least 2 midnights  PT Class (Do Not Modify): Inpatient [101]  PT Acc Code (Do Not Modify): Private [1]       B Medical/Surgery History Past Medical History:  Diagnosis Date  . Allergy   . Blindness - both eyes 1967   Basketball injury  . Diabetes mellitus   . Hernia   . Hyperlipidemia   . Hypertension   . Marfan syndrome    Past Surgical History:  Procedure Laterality Date  . APPENDECTOMY    . INGUINAL HERNIA REPAIR     Right 1970s,    Left 2011 by Dr Andrey Campanile  . INGUINAL HERNIA REPAIR  08/25/10   recurrent left  . PENILE PROSTHESIS IMPLANT  2000s   [facility], Dr Brunilda Payor  . RETINAL DETACHMENT SURGERY  1970  . Rotator Cuff Repair  2009   Left side, Dr Elita Quick   . ROTATOR CUFF REPAIR  2010-2011?   right side     A IV Location/Drains/Wounds Patient Lines/Drains/Airways Status   Active Line/Drains/Airways    Name:   Placement date:   Placement time:   Site:   Days:   Peripheral IV 12/26/18 Right Antecubital   12/26/18    0055    Antecubital   less than 1          Intake/Output Last 24 hours  Intake/Output Summary (Last 24 hours) at 12/26/2018 1652 Last data filed at 12/26/2018 1457 Gross per 24 hour  Intake 598.3 ml  Output 325 ml  Net 273.3 ml    Labs/Imaging Results for orders placed or performed during the hospital encounter of 12/25/18 (from the past 48 hour(s))  Urinalysis, Routine w reflex microscopic     Status: Abnormal   Collection Time: 12/25/18  6:15 PM  Result Value Ref Range   Color, Urine YELLOW YELLOW   APPearance CLEAR CLEAR   Specific Gravity, Urine 1.031 (H) 1.005 - 1.030   pH 5.0 5.0 - 8.0   Glucose, UA >=500 (A) NEGATIVE mg/dL   Hgb urine dipstick SMALL (A) NEGATIVE   Bilirubin Urine NEGATIVE NEGATIVE   Ketones, ur NEGATIVE NEGATIVE mg/dL   Protein, ur NEGATIVE NEGATIVE mg/dL   Nitrite NEGATIVE NEGATIVE   Leukocytes,Ua NEGATIVE NEGATIVE   RBC / HPF 0-5 0 - 5 RBC/hpf   WBC, UA  0-5 0 - 5 WBC/hpf   Bacteria, UA NONE SEEN NONE SEEN   Mucus PRESENT     Comment: Performed at [facility], [address] [place], [place] [address]  Culture, blood (Routine x 2)     Status: None (Preliminary result)   Collection Time: 12/25/18  6:16 PM   Specimen: BLOOD  Result Value Ref Range   Specimen Description BLOOD RIGHT ANTECUBITAL    Special Requests      BOTTLES DRAWN AEROBIC AND ANAEROBIC Blood Culture adequate volume   Culture      NO GROWTH < 24 HOURS Performed at [facility], [address] [place], [place] [address]    Report Status PENDING   Lactic acid, plasma     Status: None   Collection Time: 12/25/18  6:19 PM  Result Value Ref Range   Lactic Acid, Venous 1.2 0.5 - 1.9 mmol/L    Comment: Performed at [facility],  [address], [place], [place] [address]  Comprehensive metabolic panel     Status: Abnormal   Collection Time: 12/25/18  6:19 PM  Result Value Ref Range   Sodium 139 135 - 145 mmol/L   Potassium 4.1 3.5 - 5.1 mmol/L   Chloride 107 98 - 111 mmol/L   CO2 21 (L) 22 - 32 mmol/L   Glucose, Bld 216 (H) 70 - 99 mg/dL   BUN 27 (H) 8 - 23 mg/dL   Creatinine, Ser 9.56 (H) 0.61 - 1.24 mg/dL   Calcium 9.0 8.9 - 21.3 mg/dL   Total Protein 7.2 6.5 - 8.1 g/dL   Albumin 2.9 (L) 3.5 - 5.0 g/dL   AST 27 15 - 41 U/L   ALT 36 0 - 44 U/L   Alkaline Phosphatase 54 38 - 126 U/L   Total Bilirubin 1.3 (H) 0.3 - 1.2 mg/dL   GFR calc non Af Amer 42 (L) >60 mL/min   GFR calc Af Amer 49 (L) >60 mL/min   Anion gap 11 5 - 15    Comment: Performed at [facility], [address] [place], [place] [address]  CBC with Differential     Status: Abnormal   Collection Time: 12/25/18  6:19 PM  Result Value Ref Range   WBC 15.8 (H) 4.0 - 10.5 K/uL   RBC 3.91 (L) 4.22 - 5.81 MIL/uL   Hemoglobin 11.4 (L) 13.0 - 17.0 g/dL   HCT 84.6 (L) 96.2 - 95.2 %   MCV 97.4 80.0 - 100.0 fL   MCH 29.2 26.0 - 34.0 pg   MCHC 29.9 (L) 30.0 - 36.0 g/dL   RDW 84.1 32.4 - 40.1 %   Platelets 334 150 - 400 K/uL   nRBC 0.0 0.0 - 0.2 %   Neutrophils Relative % 84 %   Neutro Abs 13.3 (H) 1.7 - 7.7 K/uL   Lymphocytes Relative 6 %   Lymphs Abs 0.9 0.7 - 4.0 K/uL   Monocytes Relative 9 %   Monocytes Absolute 1.5 (H) 0.1 - 1.0 K/uL   Eosinophils Relative 0 %   Eosinophils Absolute 0.1 0.0 - 0.5 K/uL   Basophils Relative 0 %   Basophils Absolute 0.0 0.0 - 0.1 K/uL   Immature Granulocytes 1 %   Abs Immature Granulocytes 0.08 (H) 0.00 - 0.07 K/uL    Comment: Performed at [facility], [address], [place], [place] [address]  Protime-INR     Status: None   Collection Time:  12/25/18  6:24 PM  Result Value Ref Range   Prothrombin Time 14.0 11.4 - 15.2 seconds   INR 1.1 0.8 - 1.2    Comment: (NOTE) INR goal varies based on  device and disease states. Performed at [facility], [place] [address], [place], [place] [address]   Culture, blood (Routine x 2)     Status: None (Preliminary result)   Collection Time: 12/25/18  6:24 PM   Specimen: BLOOD  Result Value Ref Range   Specimen Description BLOOD LEFT ANTECUBITAL    Special Requests      BOTTLES DRAWN AEROBIC ONLY Blood Culture adequate volume   Culture      NO GROWTH < 24 HOURS Performed at [facility], [place] [address], [place], [place] [address]    Report Status PENDING   Lactic acid, plasma     Status: None   Collection Time: 12/26/18 12:56 AM  Result Value Ref Range   Lactic Acid, Venous 1.1 0.5 - 1.9 mmol/L    Comment: Performed at [facility] [address] [place], [place] [address]  Wound or Superficial Culture     Status: None (Preliminary result)   Collection Time: 12/26/18 12:57 AM   Specimen: Wound  Result Value Ref Range   Specimen Description WOUND FOOT LEFT    Special Requests NONE    Gram Stain      RARE WBC PRESENT, PREDOMINANTLY PMN MODERATE GRAM POSITIVE COCCI IN PAIRS MODERATE GRAM NEGATIVE RODS Performed at [facility], [place] [address], [place], [place] [address]    Culture PENDING    Report Status PENDING   SARS CORONAVIRUS 2 (TAT 6-24 HRS) Nasopharyngeal Nasopharyngeal Swab     Status: None   Collection Time: 12/26/18 12:57 AM   Specimen: Nasopharyngeal Swab  Result Value Ref Range   SARS Coronavirus 2 NEGATIVE NEGATIVE    Comment: (NOTE) SARS-CoV-2 target nucleic acids are NOT DETECTED. The SARS-CoV-2 RNA is generally detectable in upper and lower respiratory specimens during the acute phase of infection. Negative results do not preclude SARS-CoV-2 infection, do not rule out co-infections with other pathogens, and should not be used as the sole basis for treatment or other patient management decisions. Negative results must be combined with clinical observations, patient history, and  epidemiological information. The expected result is Negative. Fact Sheet for Patients: SugarRoll.be Fact Sheet for Healthcare Providers: https://www.woods-mathews.com/ This test is not yet approved or cleared by the [place] FDA and  has been authorized for detection and/or diagnosis of SARS-CoV-2 by FDA under an Emergency Use Authorization (EUA). This EUA will remain  in effect (meaning this test can be used) for the duration of the COVID-19 declaration under Section 56 4(b)(1) of the Act, 21 U.S.C. section 360bbb-3(b)(1), unless the authorization is terminated or revoked sooner. Performed at [facility], [place] [address] [place], [place] [address]   HIV Antibody (routine testing w rflx)     Status: None   Collection Time: 12/26/18  3:11 AM  Result Value Ref Range   HIV Screen 4th Generation wRfx NON REACTIVE NON REACTIVE    Comment: Performed at [facility] [address], [place], [place] [address]  Hemoglobin A1c     Status: Abnormal   Collection Time: 12/26/18  3:11 AM  Result Value Ref Range   Hgb A1c MFr Bld 6.4 (H) 4.8 - 5.6 %    Comment: (NOTE) Pre diabetes:  5.7%-6.4% Diabetes:              >6.4% Glycemic control for   <7.0% adults with diabetes    Mean Plasma Glucose 136.98 mg/dL    Comment: Performed at [facility], [address] [place], [place] [address]  CBC     Status: Abnormal   Collection Time: 12/26/18  3:11 AM  Result Value Ref Range   WBC 17.0 (H) 4.0 - 10.5 K/uL   RBC 3.48 (L) 4.22 - 5.81 MIL/uL   Hemoglobin 10.2 (L) 13.0 - 17.0 g/dL   HCT 99.3 (L) 71.6 - 96.7 %   MCV 99.1 80.0 - 100.0 fL   MCH 29.3 26.0 - 34.0 pg   MCHC 29.6 (L) 30.0 - 36.0 g/dL   RDW 89.3 81.0 - 17.5 %   Platelets 274 150 - 400 K/uL   nRBC 0.0 0.0 - 0.2 %    Comment: Performed at [facility], [address], [place], [place] [address]  Basic metabolic panel     Status: Abnormal   Collection Time:  12/26/18  3:11 AM  Result Value Ref Range   Sodium 141 135 - 145 mmol/L   Potassium 4.1 3.5 - 5.1 mmol/L   Chloride 110 98 - 111 mmol/L   CO2 17 (L) 22 - 32 mmol/L   Glucose, Bld 170 (H) 70 - 99 mg/dL   BUN 33 (H) 8 - 23 mg/dL   Creatinine, Ser 5.27 (H) 0.61 - 1.24 mg/dL   Calcium 8.5 (L) 8.9 - 10.3 mg/dL   GFR calc non Af Amer 45 (L) >60 mL/min   GFR calc Af Amer 52 (L) >60 mL/min   Anion gap 14 5 - 15    Comment: Performed at [facility], [address], [place], [place] [address]  CBG monitoring, ED     Status: Abnormal   Collection Time: 12/26/18  4:02 AM  Result Value Ref Range   Glucose-Capillary 152 (H) 70 - 99 mg/dL  CBG monitoring, ED     Status: Abnormal   Collection Time: 12/26/18  9:24 AM  Result Value Ref Range   Glucose-Capillary 122 (H) 70 - 99 mg/dL   Comment 1 Notify RN    Comment 2 Document in Chart   CBG monitoring, ED     Status: Abnormal   Collection Time: 12/26/18  4:41 PM  Result Value Ref Range   Glucose-Capillary 123 (H) 70 - 99 mg/dL   Comment 1 Notify RN    Comment 2 Document in Chart    Dg Chest 2 View  Result Date: 12/25/2018 CLINICAL DATA:  Pt arrives via [facility]. C/o sore on the left foot (diabetic ulcer) for about 2-3 weeks. Increased pain with ambulation. Fever for 3 days. Has taken tylenol for the same. EXAM: CHEST - 2 VIEW COMPARISON:  Chest radiograph 12/10/2009 FINDINGS: Stable cardiomediastinal contours. The lungs are clear. No pneumothorax or pleural effusion. No acute finding in the visualized skeleton. IMPRESSION: No evidence of active disease in the chest. Electronically Signed   By: Emmaline Kluver M.D.   On: 12/25/2018 19:08   Mr Foot Left W Wo Contrast  Result Date: 12/26/2018 CLINICAL DATA:  Foot swelling. EXAM: MRI OF THE LEFT FOREFOOT WITHOUT AND WITH CONTRAST TECHNIQUE: Multiplanar, multisequence MR imaging of the left forefoot was performed both before and after administration of intravenous contrast. CONTRAST:  87mL  GADAVIST GADOBUTROL 1 MMOL/ML IV SOLN COMPARISON:  Radiographs dated 12/25/2018 FINDINGS: Bones/Joint/Cartilage There  is irregular enhancement of the head of the third metatarsal with subtle decreased signal in the same area on T1 weighted imaging. This is consistent with osteomyelitis of the third metatarsal head. There is slight enhancement of the base of the proximal phalanx of the third toe with edema in the proximal phalanx. There is an effusion at the third MTP joint with gas in the fluid. Gas and fluid extends proximally along the extensor and flexor tendon sheaths of the third ray best seen on image 19 of series 4. No other significant bone abnormality. Specifically, the distal phalanx of the little toe appears normal. Muscles and Tendons Infectious tenosynovitis involving the flexor and extensor tendons of the third ray as described above. Gas in the fluid surrounding the tendons adjacent to the third metatarsal. Soft tissues Soft tissue ulceration on the plantar aspect of the foot with the level of the head of the third metatarsal. Enhancement of the adjacent soft tissues after contrast administration consistent with cellulitis. IMPRESSION: 1. Osteomyelitis of the head of the third metatarsal. Infection of the third MTP joint. 2. Infectious tenosynovitis of the flexor and extensor tendons of the third ray. 3. Soft tissue ulceration on the plantar aspect of the foot at the level of the head of the third metatarsal. Electronically Signed   By: Francene BoyersJames  Maxwell M.D.   On: 12/26/2018 11:05   Dg Foot Complete Left  Result Date: 12/25/2018 CLINICAL DATA:  Left foot infection EXAM: LEFT FOOT - COMPLETE 3+ VIEW COMPARISON:  None. FINDINGS: Soft tissue ulceration is noted along the volar aspect at the head of the third metatarsal. Subcortical lucency of third metatarsal head raises some suspicion for early features of osteomyelitis. No destructive change or periostitis is present at this time. Questionable  fragmentation at the tuft of the fifth distal phalanx. No other acute bony abnormality. 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 IMPRESSION: 1. Soft tissue ulceration and gas along the volar aspect at the head of the third metatarsal. Subcortical lucency of the third metatarsal head raises suspicion for early features of osteomyelitis. 2. Questionable fragmentation at the tuft of the fifth distal phalanx, correlate with point tenderness to assess for acute fracture. Electronically Signed   By: Kreg ShropshirePrice  DeHay M.D.   On: 12/25/2018 19:13    Pending Labs Unresulted Labs (From admission, onward)   None      Vitals/Pain Today's Vitals   12/26/18 0700 12/26/18 0743 12/26/18 0920 12/26/18 1301  BP: (!) 161/79  136/75   Pulse:   72   Resp: (!) 23  13   Temp:   98.4 F (36.9 C)   TempSrc:   Oral   SpO2:   99%   Weight:      Height:      PainSc:  0-No pain  0-No pain    Isolation Precautions No active isolations  Medications Medications  sodium chloride flush (NS) 0.9 % injection 3 mL (3 mLs Intravenous Not Given 12/26/18 0235)  simvastatin (ZOCOR) tablet 40 mg (40 mg Oral Given 12/26/18 0609)  heparin injection 5,000 Units (has no administration in time range)  acetaminophen (TYLENOL) tablet 650 mg (has no administration in time range)    Or  acetaminophen (TYLENOL) suppository 650 mg (has no administration in time range)  oxyCODONE (Oxy IR/ROXICODONE) immediate release tablet 5 mg (has no administration in time range)  insulin aspart (novoLOG) injection 0-9 Units (0 Units Subcutaneous Not Given 12/26/18 1651)  metoprolol tartrate (LOPRESSOR)  tablet 12.5 mg (has no administration in time range)  vancomycin (VANCOCIN) 1,750 mg in sodium chloride 0.9 % 500 mL IVPB (has no administration in time range)  ceFEPIme (MAXIPIME) 2 g in sodium chloride 0.9 % 100 mL IVPB (0 g Intravenous Stopped 12/26/18 1142)  metroNIDAZOLE (FLAGYL) tablet 500 mg (has no  administration in time range)  acetaminophen (TYLENOL) tablet 650 mg (650 mg Oral Given 12/25/18 1825)  vancomycin (VANCOCIN) 2,000 mg in sodium chloride 0.9 % 500 mL IVPB (0 mg Intravenous Stopped 12/26/18 0423)  piperacillin-tazobactam (ZOSYN) IVPB 3.375 g (0 g Intravenous Stopped 12/26/18 0355)  gadobutrol (GADAVIST) 1 MMOL/ML injection 10 mL (10 mLs Intravenous Contrast Given 12/26/18 1050)    Mobility walks Low fall risk   Focused Assessments Ortho   R Recommendations: See Admitting Provider Note  Report given to:   Additional Notes:   Pt is blind

## 2018-12-26 NOTE — ED Provider Notes (Signed)
Patient seen/examined in the Emergency Department in conjunction with Advanced Practice Provider [place] Patient reports left foot soreness for 2-3 weeks   Exam : awake/alert, large wound to left foot.   Plan: admit for osteomyelitis treatment         Patient gave verbal permission to utilize photo for medical documentation only The image was not stored on any personal device    Ripley Fraise, MD 12/26/18 0136

## 2018-12-27 ENCOUNTER — Inpatient Hospital Stay (HOSPITAL_COMMUNITY): Payer: Medicare Other

## 2018-12-27 ENCOUNTER — Other Ambulatory Visit: Payer: Self-pay | Admitting: Physician Assistant

## 2018-12-27 DIAGNOSIS — I712 Thoracic aortic aneurysm, without rupture: Secondary | ICD-10-CM

## 2018-12-27 DIAGNOSIS — E119 Type 2 diabetes mellitus without complications: Secondary | ICD-10-CM

## 2018-12-27 LAB — COMPREHENSIVE METABOLIC PANEL
ALT: 40 U/L (ref 0–44)
AST: 30 U/L (ref 15–41)
Albumin: 2.2 g/dL — ABNORMAL LOW (ref 3.5–5.0)
Alkaline Phosphatase: 46 U/L (ref 38–126)
Anion gap: 7 (ref 5–15)
BUN: 26 mg/dL — ABNORMAL HIGH (ref 8–23)
CO2: 22 mmol/L (ref 22–32)
Calcium: 8.6 mg/dL — ABNORMAL LOW (ref 8.9–10.3)
Chloride: 113 mmol/L — ABNORMAL HIGH (ref 98–111)
Creatinine, Ser: 1.35 mg/dL — ABNORMAL HIGH (ref 0.61–1.24)
GFR calc Af Amer: >60 mL/min (ref >60)
GFR calc non Af Amer: 53 mL/min — ABNORMAL LOW (ref >60)
Glucose, Bld: 106 mg/dL — ABNORMAL HIGH (ref 70–99)
Potassium: 4.4 mmol/L (ref 3.5–5.1)
Sodium: 142 mmol/L (ref 135–145)
Total Bilirubin: 1.1 mg/dL (ref 0.3–1.2)
Total Protein: 6 g/dL — ABNORMAL LOW (ref 6.5–8.1)

## 2018-12-27 LAB — CBC
HCT: 31.9 % — ABNORMAL LOW (ref 39.0–52.0)
Hemoglobin: 9.7 g/dL — ABNORMAL LOW (ref 13.0–17.0)
MCH: 29.2 pg (ref 26.0–34.0)
MCHC: 30.4 g/dL (ref 30.0–36.0)
MCV: 96.1 fL (ref 80.0–100.0)
Platelets: 270 10*3/uL (ref 150–400)
RBC: 3.32 MIL/uL — ABNORMAL LOW (ref 4.22–5.81)
RDW: 11.5 % (ref 11.5–15.5)
WBC: 13.3 10*3/uL — ABNORMAL HIGH (ref 4.0–10.5)
nRBC: 0 % (ref 0.0–0.2)

## 2018-12-27 LAB — GLUCOSE, CAPILLARY
Glucose-Capillary: 90 mg/dL (ref 70–99)
Glucose-Capillary: 144 mg/dL — ABNORMAL HIGH (ref 70–99)
Glucose-Capillary: 156 mg/dL — ABNORMAL HIGH (ref 70–99)

## 2018-12-27 LAB — SURGICAL PCR SCREEN
MRSA, PCR: NEGATIVE
Staphylococcus aureus: NEGATIVE

## 2018-12-27 IMAGING — CT CT CHEST W/ CM
2 of 3 series · 15 of 36 positions shown, 18 images · IV contrast (APPLIED)
Comparison: None.

CLINICAL DATA: 68-year-old male with a history of foot
wound/critical limb ischemia, osteomyelitis, cardiovascular risk
factors, and history of aortic aneurysm

EXAM:
CT ANGIOGRAPHY CHEST WITH CONTRAST
TECHNIQUE: Multidetector CT imaging of the chest was performed using the
standard protocol during bolus administration of intravenous
contrast. Multiplanar CT image reconstructions and MIPs were
obtained to evaluate the vascular anatomy.
CONTRAST:  75mL OMNIPAQUE IOHEXOL 300 MG/ML  SOLN

[Series 3: thorax 2.0 i31f 2 · axial · 0.83mm/px · z∈[+864,+1174]mm · 12 of 183 slices shown, 15 images]
[im 14/183  mediastinal]
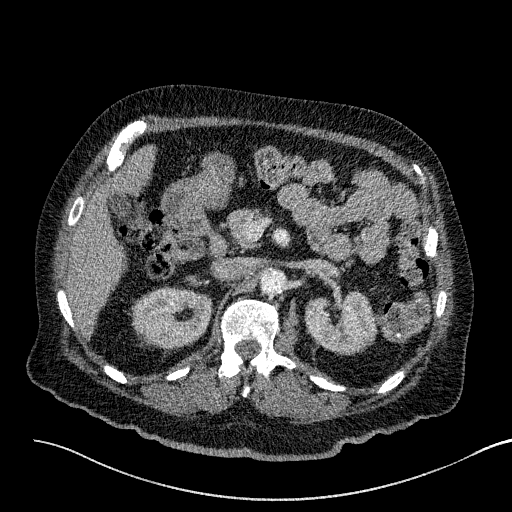
[im 14/183  lung]
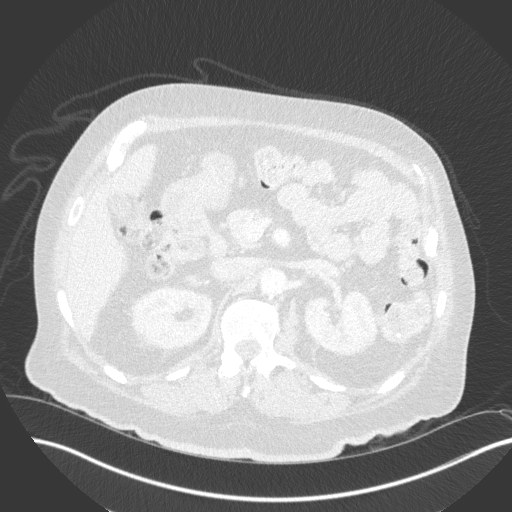
[im 27/183  lung]
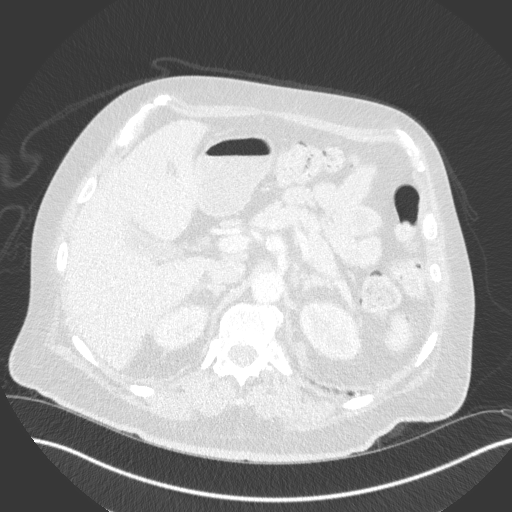
[im 41/183  lung]
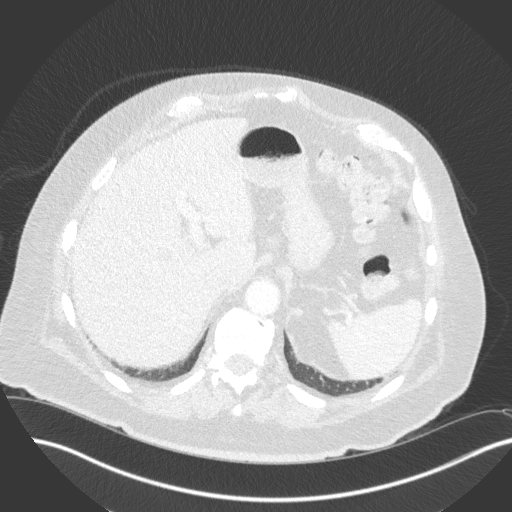
[im 54/183  lung]
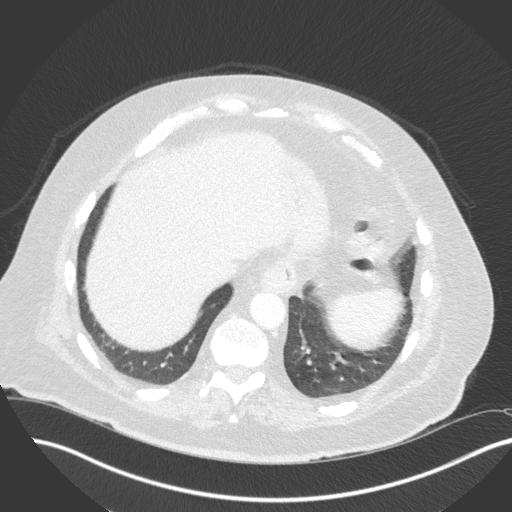
[im 68/183  mediastinal]
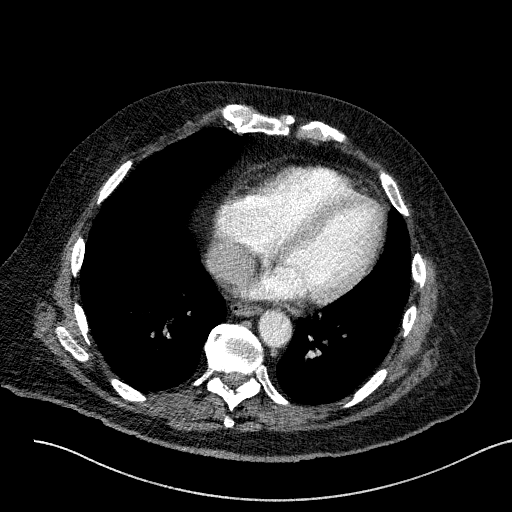
[im 68/183  lung]
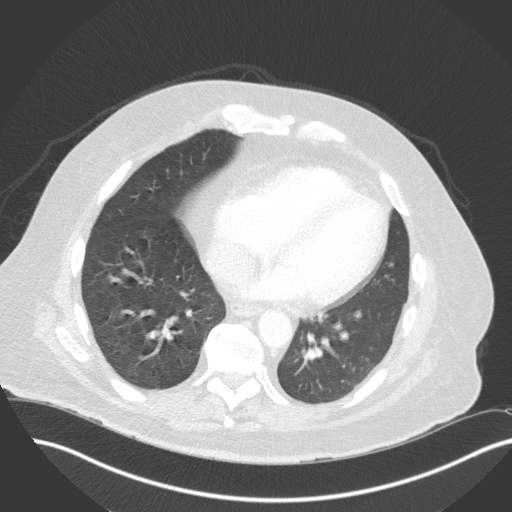
[im 81/183  lung]
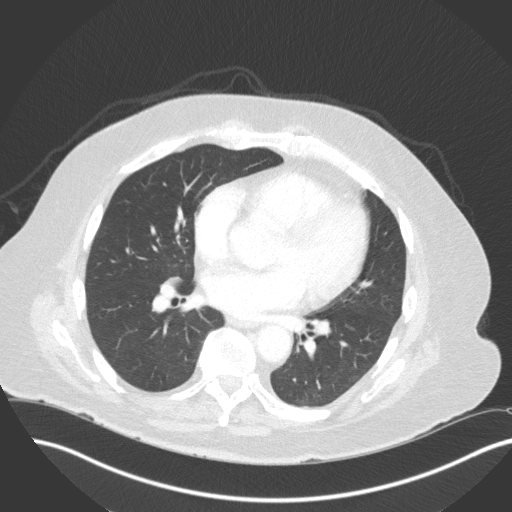
[im 102/183  lung]
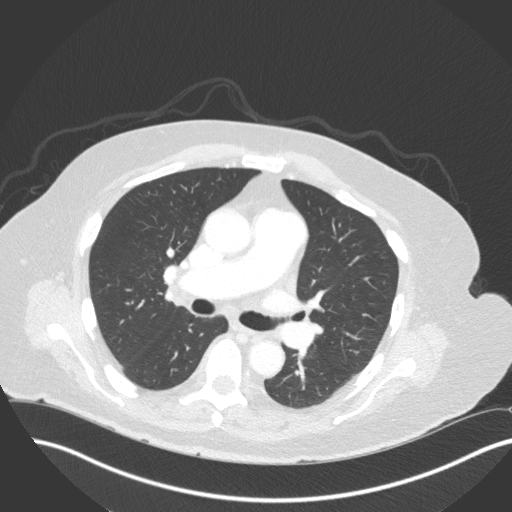
[im 115/183  lung]
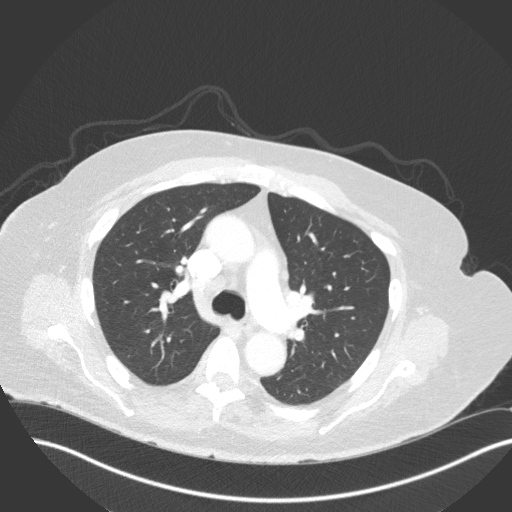
[im 129/183  mediastinal]
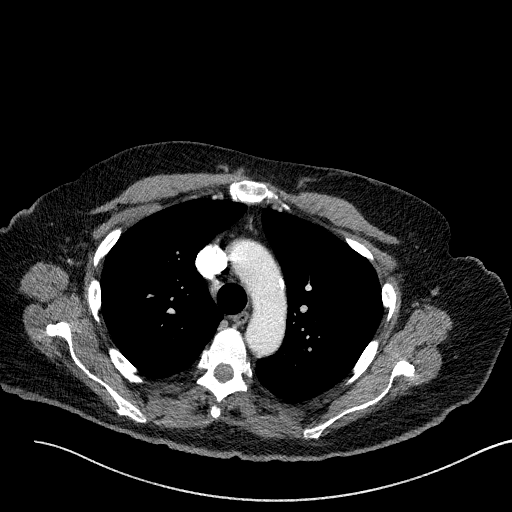
[im 129/183  lung]
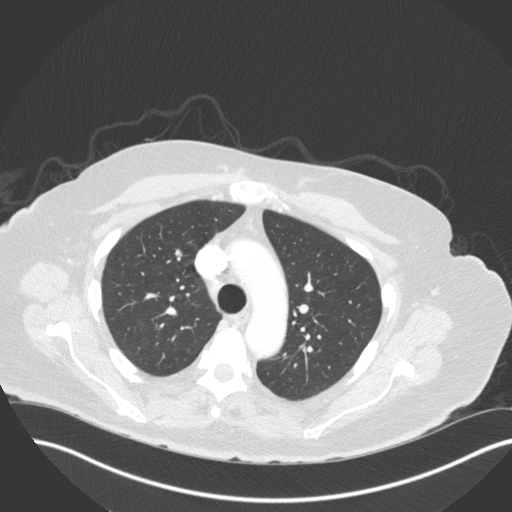
[im 142/183  lung]
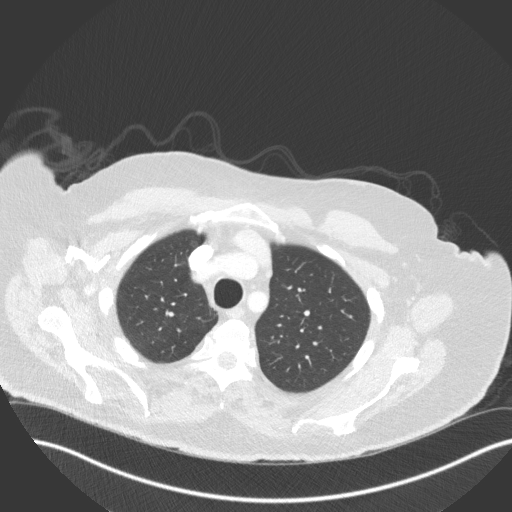
[im 156/183  lung]
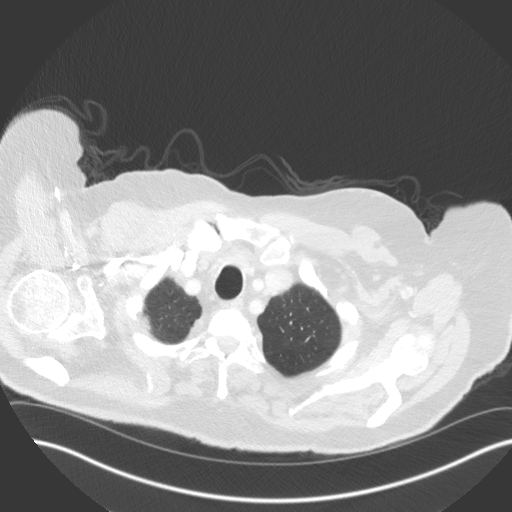
[im 169/183  lung]
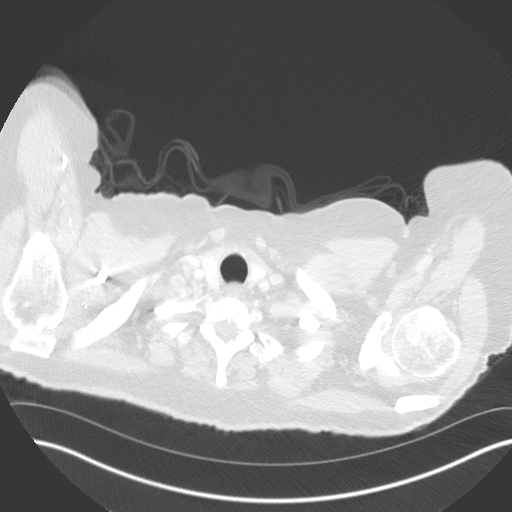

[Series 6: coronal · coronal · 0.72mm/px · 3 of 165 slices shown]
[im 33/165  lung]
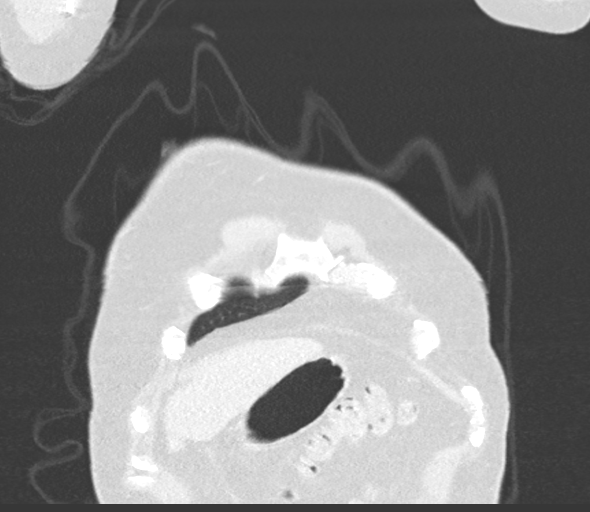
[im 66/165  lung]
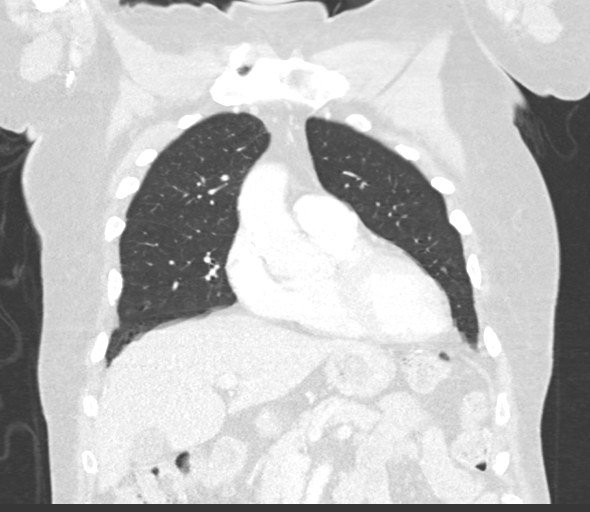
[im 99/165  lung]
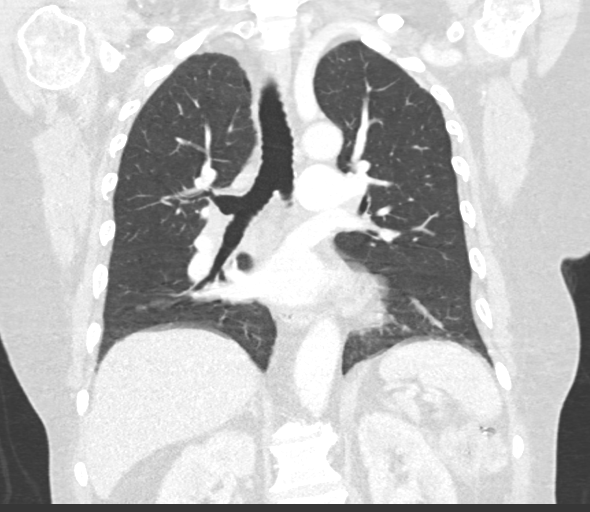

[15 of 36 positions shown; findings below may reference images not displayed]

FINDINGS: Cardiovascular:

Heart:

No cardiomegaly. No pericardial fluid/thickening. Calcifications of
the left main, right coronary arteries.

Aorta:

The annulus estimated 26 mm, coronal images, on this non gated CT
chest. The greatest estimated diameter of the JIM junction
35 mm on the coronal reformatted images. Greatest diameter of the
ascending aorta measures 37 mm.

Atherosclerotic changes of the aortic arch. Branch vessels are
patent.

No dissection or aneurysm of the descending thoracic aorta. No
periaortic inflammatory changes.

Pulmonary arteries:

Timing of the contrast bolus not optimized for evaluation of the
distal pulmonary arteries, however, there are no filling defects of
the main pulmonary artery, lobar arteries, or segmental pulmonary
arteries.

Mediastinum/Nodes: Small lymph nodes of the mediastinum, without
enlargement. No comparison available. Unremarkable appearance of the
thoracic esophagus.

Unremarkable thoracic inlet.

Lungs/Pleura: Central airways are clear. No pleural effusion. No
confluent airspace disease.

No pneumothorax.

Upper Abdomen: No acute finding of the upper abdomen.

Musculoskeletal: No acute displaced fracture. Degenerative changes
of the spine. No bony canal narrowing. No aggressive lytic or
sclerotic lesions.

Review of the MIP images confirms the above findings.
IMPRESSION: No acute CT finding.

Greatest diameter of the ascending aorta measures 37 mm. Greatest
diameter of the annulus is estimated 26 mm on the coronal
reformatted images, though the study is not cardiac gated.

Aortic Atherosclerosis ([EO]-[EO]).

## 2018-12-27 MED ORDER — INSULIN ASPART 100 UNIT/ML ~~LOC~~ SOLN
0.0000 [IU] | Freq: Three times a day (TID) | SUBCUTANEOUS | Status: DC
  Administered 2018-12-27: 2 [IU] via SUBCUTANEOUS
  Administered 2018-12-27: 1 [IU] via SUBCUTANEOUS
  Administered 2018-12-28: 2 [IU] via SUBCUTANEOUS
  Administered 2018-12-29: 3 [IU] via SUBCUTANEOUS
  Administered 2018-12-29: 1 [IU] via SUBCUTANEOUS
  Administered 2018-12-29 – 2018-12-30 (×3): 2 [IU] via SUBCUTANEOUS
  Administered 2018-12-30: 1 [IU] via SUBCUTANEOUS
  Administered 2018-12-30 – 2018-12-31 (×3): 2 [IU] via SUBCUTANEOUS
  Administered 2018-12-31: 1 [IU] via SUBCUTANEOUS
  Administered 2019-01-01: 2 [IU] via SUBCUTANEOUS

## 2018-12-27 MED ORDER — IOHEXOL 300 MG/ML  SOLN
75.0000 mL | Freq: Once | INTRAMUSCULAR | Status: AC | PRN
  Administered 2018-12-27: 75 mL via INTRAVENOUS

## 2018-12-27 MED ORDER — CHLORHEXIDINE GLUCONATE 4 % EX LIQD
60.0000 mL | Freq: Once | CUTANEOUS | Status: AC
  Administered 2018-12-28: 4 via TOPICAL
  Filled 2018-12-27: qty 60

## 2018-12-27 MED ORDER — SODIUM CHLORIDE 0.9 % IV SOLN
2.0000 g | Freq: Three times a day (TID) | INTRAVENOUS | Status: DC
  Administered 2018-12-27 – 2018-12-29 (×7): 2 g via INTRAVENOUS
  Filled 2018-12-27 (×8): qty 2

## 2018-12-27 MED ORDER — VANCOMYCIN HCL 10 G IV SOLR
2000.0000 mg | INTRAVENOUS | Status: DC
  Administered 2018-12-28 – 2018-12-29 (×2): 2000 mg via INTRAVENOUS
  Filled 2018-12-27 (×3): qty 2000

## 2018-12-27 MED ORDER — CEFAZOLIN SODIUM-DEXTROSE 2-4 GM/100ML-% IV SOLN
2.0000 g | INTRAVENOUS | Status: AC
  Administered 2018-12-28: 2 g via INTRAVENOUS
  Filled 2018-12-27 (×2): qty 100

## 2018-12-27 NOTE — Progress Notes (Signed)
Family Medicine Teaching Service Daily Progress Note Intern Pager: [phone]  Patient name: Chad Gibsonhomas W Huseby Medical record number: [id number] Date of birth: Jun 10, 1950 Age: 68 y.o. Gender: male  Primary Care Provider: Arlyce HarmanLockamy, Timothy, DO Consultants: Orthopedic surgery Code Status: FULL  Pt Overview and Major Events to Date:  12/26/18 : Admitted, Ortho consult 12/27/18 Cardiothoracic surgery for TAA   Assessment and Plan:  Chad Hall is a 68 y.o. male presenting with left foot ulcer. PMH is significant for Marfan syndrome, legally blind, hypertension, type 2 diabetes, hyperlipidemia  Sepsis secondary to diabetic ulcer  Osteomyelitis  Patient here for pain in left foot for the past 3 weeks that started as a callus in which patient stated he removed and it began to have drainage.  Yesterday prior to arrival to the ED, patient was febrile at home and found to be febrile in the ED 100.3 F.  Zosyn and vancomycin started in the ED was changed to vancomycin, metronidazole and cefepime yesterday.  Wound cultures growing gram positive cocci in pairs and gram-negative rods.  Speciation pending.  Yesterday, MRI was significant for third MTP osteomyelitis and tenosynovitis no distal phalanx abnormality seen on the fifth toe. Mild leukocytosis this morning with a WBC 13.3 which is downtrending from 17.0 yesterday.  Orthopedic surgery is following and recommended patient be NPO at midnight for left foot third ray amputation tomorrow.  Will obtain ABIs.  -Ortho following, appreciate recommendations: Surgery 11/11 -Discontinued Zosyn(11/8-11/9) -Metronidazole (11/9 - ) -Cefepime (11/9 - ) -Continue vancomycin (11/8-)  -Oxycodone IR 5 mg every 6 hours as needed -Acetaminophen 650 mg every 6 hours as needed -Vitals per routine -Follow-up blood cultures no growth day 2 of 5 -Follow-up wound cultures  -Follow-up on ABIs  Marfans Syndrome  Blindness  TAA Patient with history of retinal  detachment causing blindness. Aneurysm of the aortic root, measuring 48 mm. Measured 45mm on prior TTE from 12/12/14.  Cardiothoracic surgery to see later in the day. -Cardiothoracic surgery consulted, appreciate recommendations -Follow-up CT chest -Be sensitive to patient's needs  Diabetes HbA1C 6.4.  Home medication glipizide 10 mg daily, Jardiance 10 mg daily, Tradjenta 5 mg daily, Zocor 40 mg.  -Hold home medications -Sensitive sliding scale insulin  -Monitor CBGs -Follow-up ABIs.  Hypertension Normotensive 112/63.  Home medication Metoprolol succinate 12.5mg  once daily -Continue to monitor -Lopressor 12.5 mg BID while in hospital due to shorter half life in case of hypotension  CKD Stage 3a Creatinine slightly improved from baseline today at 1.35.    Baseline around 1.5-1.6. -daily BMP -avoid nephrotoxic medications    FEN/GI: Modified diet, NPO at midnight PPx: Heparin  Disposition: Pending medical clearance  Subjective:  Chad Gibsonhomas W Hilger no significant overnight events.  Objective: Temp:  [98.4 F (36.9 C)-99 F (37.2 C)] 98.5 F (36.9 C) (11/10 0512) Pulse Rate:  [72-97] 83 (11/10 0512) Resp:  [13-18] 18 (11/10 0512) BP: (129-160)/(74-83) 160/74 (11/10 0512) SpO2:  [99 %-100 %] 99 % (11/10 0512)  Physical Exam: General: Alert, sitting on the edge of the bed Cardiovascular: Regular rate and rhythm, no murmurs appreciated Respiratory: Clear to auscultation bilaterally, no increased work of breathing Abdomen: Soft nontender non-distended, bowel sounds present Extremities: Left lower extremity edema to the knee, left foot: Plantar surface round ulcer with purulent discharge, right foot: 3 callus, no ulcers, no discharge Neurological: Alert, responding appropriately, no abnormal movements  Laboratory: Recent Labs  Lab 12/25/18 1819 12/26/18 0311 12/27/18 0246  WBC 15.8* 17.0* 13.3*  HGB 11.4*  10.2* 9.7*  HCT 38.1* 34.5* 31.9*  PLT 334 274 270    Recent Labs  Lab 12/25/18 1819 12/26/18 0311 12/27/18 0246  NA 139 141 142  K 4.1 4.1 4.4  CL 107 110 113*  CO2 21* 17* 22  BUN 27* 33* 26*  CREATININE 1.63* 1.54* 1.35*  CALCIUM 9.0 8.5* 8.6*  PROT 7.2  --  6.0*  BILITOT 1.3*  --  1.1  ALKPHOS 54  --  46  ALT 36  --  40  AST 27  --  30  GLUCOSE 216* 170* 106*      Imaging/Diagnostic Tests: Dg Chest 2 View  Result Date: 12/25/2018 CLINICAL DATA:  Pt arrives via [facility]. C/o sore on the left foot (diabetic ulcer) for about 2-3 weeks. Increased pain with ambulation. Fever for 3 days. Has taken tylenol for the same. EXAM: CHEST - 2 VIEW COMPARISON:  Chest radiograph 12/10/2009 FINDINGS: Stable cardiomediastinal contours. The lungs are clear. No pneumothorax or pleural effusion. No acute finding in the visualized skeleton. IMPRESSION: No evidence of active disease in the chest. Electronically Signed   By: Emmaline Kluver M.D.   On: 12/25/2018 19:08   Mr Foot Left W Wo Contrast  Result Date: 12/26/2018 CLINICAL DATA:  Foot swelling. EXAM: MRI OF THE LEFT FOREFOOT WITHOUT AND WITH CONTRAST TECHNIQUE: Multiplanar, multisequence MR imaging of the left forefoot was performed both before and after administration of intravenous contrast. CONTRAST:  15mL GADAVIST GADOBUTROL 1 MMOL/ML IV SOLN COMPARISON:  Radiographs dated 12/25/2018 FINDINGS: Bones/Joint/Cartilage There is irregular enhancement of the head of the third metatarsal with subtle decreased signal in the same area on T1 weighted imaging. This is consistent with osteomyelitis of the third metatarsal head. There is slight enhancement of the base of the proximal phalanx of the third toe with edema in the proximal phalanx. There is an effusion at the third MTP joint with gas in the fluid. Gas and fluid extends proximally along the extensor and flexor tendon sheaths of the third ray best seen on image 19 of series 4. No other significant bone abnormality. Specifically, the distal phalanx  of the little toe appears normal. Muscles and Tendons Infectious tenosynovitis involving the flexor and extensor tendons of the third ray as described above. Gas in the fluid surrounding the tendons adjacent to the third metatarsal. Soft tissues Soft tissue ulceration on the plantar aspect of the foot with the level of the head of the third metatarsal. Enhancement of the adjacent soft tissues after contrast administration consistent with cellulitis. IMPRESSION: 1. Osteomyelitis of the head of the third metatarsal. Infection of the third MTP joint. 2. Infectious tenosynovitis of the flexor and extensor tendons of the third ray. 3. Soft tissue ulceration on the plantar aspect of the foot at the level of the head of the third metatarsal. Electronically Signed   By: Francene Boyers M.D.   On: 12/26/2018 11:05   Dg Foot Complete Left  Result Date: 12/25/2018 CLINICAL DATA:  Left foot infection EXAM: LEFT FOOT - COMPLETE 3+ VIEW COMPARISON:  None. FINDINGS: Soft tissue ulceration is noted along the volar aspect at the head of the third metatarsal. Subcortical lucency of third metatarsal head raises some suspicion for early features of osteomyelitis. No destructive change or periostitis is present at this time. Questionable fragmentation at the tuft of the fifth distal phalanx. No other acute bony abnormality. 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 IMPRESSION: 1. Soft tissue ulceration and  gas along the volar aspect at the head of the third metatarsal. Subcortical lucency of the third metatarsal head raises suspicion for early features of osteomyelitis. 2. Questionable fragmentation at the tuft of the fifth distal phalanx, correlate with point tenderness to assess for acute fracture. Electronically Signed   By: Lovena Le M.D.   On: 12/25/2018 19:13     Lyndee Hensen, MD 12/27/2018, 8:09 AM PGY-1, [facility] Intern pager: [phone], text pages  welcome

## 2018-12-27 NOTE — Progress Notes (Signed)
Pharmacy Antibiotic Note  Chad Hall is a 68 y.o. male admitted on 12/25/2018 with osteomyelitis.  Pharmacy has been consulted for Vancomycin and Cefepime dosing. Renal function has improved, WBCs trending down, afebrile.   Plan: The dose of Vancomycin and Cefepime will be changed based on renal function.    Vancomycin 2000 mg IV Q24H  expected AUC 461.9 based on Scr 1.35 Cefepime 2 g IV Q8H   Continue to monitor for signs of clinical improvement in infectious disease parameters, including WBC and temperature. Follow up on blood and wound cultures, de-escalate as able, order levels if needed.  Height: 6\' 5"  (195.6 cm) Weight: 230 lb (104.3 kg) IBW/kg (Calculated) : 89.1  Temp (24hrs), Avg:98.6 F (37 C), Min:98.1 F (36.7 C), Max:99 F (37.2 C)  Recent Labs  Lab 12/25/18 1819 12/26/18 0056 12/26/18 0311 12/27/18 0246  WBC 15.8*  --  17.0* 13.3*  CREATININE 1.63*  --  1.54* 1.35*  LATICACIDVEN 1.2 1.1  --   --     Estimated Creatinine Clearance: 64.2 mL/min (A) (by C-G formula based on SCr of 1.35 mg/dL (H)).    No Known Allergies  Antimicrobials this admission: Vanc 11/9 >> Zosyn 11/9 x1 Cefepime 11/9 >> Flagyl 11/9 >>  Dose adjustments this admission:  Microbiology results: 11/9 Wound (superficial): GPC & GNR on stain, Moderate Alcaligenes faecalis, Few strep mitis/oralis on culture; reincubating GNR culture for better growth 11/8 BCx: ngtd  Thank you for allowing pharmacy to be a part of this patient's care.  [place] 12/27/2018 9:51 AM

## 2018-12-28 ENCOUNTER — Inpatient Hospital Stay (HOSPITAL_COMMUNITY): Payer: Medicare Other | Admitting: Anesthesiology

## 2018-12-28 ENCOUNTER — Encounter (HOSPITAL_COMMUNITY)
Admission: EM | Disposition: A | Discharge status: Home Health | Payer: Self-pay | Source: Home / Self Care | Attending: Family Medicine

## 2018-12-28 ENCOUNTER — Encounter (HOSPITAL_COMMUNITY): Payer: Medicare Other

## 2018-12-28 ENCOUNTER — Encounter (HOSPITAL_COMMUNITY): Payer: Self-pay | Admitting: Orthopedic Surgery

## 2018-12-28 DIAGNOSIS — E1159 Type 2 diabetes mellitus with other circulatory complications: Secondary | ICD-10-CM

## 2018-12-28 DIAGNOSIS — I10 Essential (primary) hypertension: Secondary | ICD-10-CM

## 2018-12-28 DIAGNOSIS — E1121 Type 2 diabetes mellitus with diabetic nephropathy: Secondary | ICD-10-CM

## 2018-12-28 DIAGNOSIS — N1831 Chronic kidney disease, stage 3a: Secondary | ICD-10-CM

## 2018-12-28 HISTORY — PX: AMPUTATION: SHX166

## 2018-12-28 LAB — CBC WITH DIFFERENTIAL/PLATELET
Abs Immature Granulocytes: 0.14 10*3/uL — ABNORMAL HIGH (ref 0.00–0.07)
Basophils Absolute: 0 10*3/uL (ref 0.0–0.1)
Basophils Relative: 0 %
Eosinophils Absolute: 0.2 10*3/uL (ref 0.0–0.5)
Eosinophils Relative: 2 %
HCT: 31.4 % — ABNORMAL LOW (ref 39.0–52.0)
Hemoglobin: 9.5 g/dL — ABNORMAL LOW (ref 13.0–17.0)
Immature Granulocytes: 1 %
Lymphocytes Relative: 10 %
Lymphs Abs: 1.1 10*3/uL (ref 0.7–4.0)
MCH: 28.8 pg (ref 26.0–34.0)
MCHC: 30.3 g/dL (ref 30.0–36.0)
MCV: 95.2 fL (ref 80.0–100.0)
Monocytes Absolute: 1.3 10*3/uL — ABNORMAL HIGH (ref 0.1–1.0)
Monocytes Relative: 11 %
Neutro Abs: 8.6 10*3/uL — ABNORMAL HIGH (ref 1.7–7.7)
Neutrophils Relative %: 76 %
Platelets: 285 10*3/uL (ref 150–400)
RBC: 3.3 MIL/uL — ABNORMAL LOW (ref 4.22–5.81)
RDW: 11.5 % (ref 11.5–15.5)
WBC: 11.4 10*3/uL — ABNORMAL HIGH (ref 4.0–10.5)
nRBC: 0 % (ref 0.0–0.2)

## 2018-12-28 LAB — PROTIME-INR
INR: 1.2 (ref 0.8–1.2)
Prothrombin Time: 14.7 seconds (ref 11.4–15.2)

## 2018-12-28 LAB — GLUCOSE, CAPILLARY
Glucose-Capillary: 165 mg/dL — ABNORMAL HIGH (ref 70–99)
Glucose-Capillary: 110 mg/dL — ABNORMAL HIGH (ref 70–99)
Glucose-Capillary: 96 mg/dL (ref 70–99)
Glucose-Capillary: 101 mg/dL — ABNORMAL HIGH (ref 70–99)
Glucose-Capillary: 92 mg/dL (ref 70–99)
Glucose-Capillary: 170 mg/dL — ABNORMAL HIGH (ref 70–99)

## 2018-12-28 LAB — BASIC METABOLIC PANEL
Anion gap: 8 (ref 5–15)
BUN: 24 mg/dL — ABNORMAL HIGH (ref 8–23)
CO2: 22 mmol/L (ref 22–32)
Calcium: 8.5 mg/dL — ABNORMAL LOW (ref 8.9–10.3)
Chloride: 113 mmol/L — ABNORMAL HIGH (ref 98–111)
Creatinine, Ser: 1.28 mg/dL — ABNORMAL HIGH (ref 0.61–1.24)
GFR calc Af Amer: >60 mL/min (ref >60)
GFR calc non Af Amer: 56 mL/min — ABNORMAL LOW (ref >60)
Glucose, Bld: 112 mg/dL — ABNORMAL HIGH (ref 70–99)
Potassium: 4.3 mmol/L (ref 3.5–5.1)
Sodium: 143 mmol/L (ref 135–145)

## 2018-12-28 LAB — APTT: aPTT: 35 seconds (ref 24–36)

## 2018-12-28 SURGERY — AMPUTATION, FOOT, RAY
Anesthesia: General | Laterality: Left

## 2018-12-28 MED ORDER — ONDANSETRON HCL 4 MG/2ML IJ SOLN: 4.0000 mg | Freq: Once | INTRAMUSCULAR | Status: DC | PRN

## 2018-12-28 MED ORDER — FENTANYL CITRATE (PF) 100 MCG/2ML IJ SOLN
INTRAMUSCULAR | Status: AC
  Filled 2018-12-28: qty 2

## 2018-12-28 MED ORDER — METOCLOPRAMIDE HCL 5 MG PO TABS: 5.0000 mg | ORAL_TABLET | Freq: Three times a day (TID) | ORAL | Status: DC | PRN

## 2018-12-28 MED ORDER — DOCUSATE SODIUM 100 MG PO CAPS
100.0000 mg | ORAL_CAPSULE | Freq: Two times a day (BID) | ORAL | Status: DC
  Administered 2018-12-28 – 2019-01-01 (×7): 100 mg via ORAL
  Filled 2018-12-28 (×8): qty 1

## 2018-12-28 MED ORDER — FENTANYL CITRATE (PF) 250 MCG/5ML IJ SOLN
INTRAMUSCULAR | Status: AC
  Filled 2018-12-28: qty 5

## 2018-12-28 MED ORDER — LACTATED RINGERS IV SOLN
INTRAVENOUS | Status: DC
  Administered 2018-12-28 (×2): via INTRAVENOUS

## 2018-12-28 MED ORDER — METRONIDAZOLE IN NACL 5-0.79 MG/ML-% IV SOLN
500.0000 mg | Freq: Three times a day (TID) | INTRAVENOUS | Status: DC
  Administered 2018-12-28 – 2018-12-29 (×4): 500 mg via INTRAVENOUS
  Filled 2018-12-28 (×4): qty 100

## 2018-12-28 MED ORDER — OXYCODONE HCL 5 MG PO TABS
10.0000 mg | ORAL_TABLET | ORAL | Status: DC | PRN
  Administered 2018-12-31: 10 mg via ORAL
  Filled 2018-12-28 (×3): qty 2

## 2018-12-28 MED ORDER — ONDANSETRON HCL 4 MG/2ML IJ SOLN
INTRAMUSCULAR | Status: DC | PRN
  Administered 2018-12-28: 4 mg via INTRAVENOUS

## 2018-12-28 MED ORDER — CEFAZOLIN SODIUM-DEXTROSE 2-3 GM-%(50ML) IV SOLR
INTRAVENOUS | Status: DC | PRN
  Administered 2018-12-28: 2 g via INTRAVENOUS

## 2018-12-28 MED ORDER — 0.9 % SODIUM CHLORIDE (POUR BTL) OPTIME
TOPICAL | Status: DC | PRN
  Administered 2018-12-28: 1000 mL

## 2018-12-28 MED ORDER — ATORVASTATIN CALCIUM 80 MG PO TABS
80.0000 mg | ORAL_TABLET | Freq: Every day | ORAL | Status: DC
  Administered 2018-12-28 – 2018-12-31 (×4): 80 mg via ORAL
  Filled 2018-12-28 (×4): qty 1

## 2018-12-28 MED ORDER — HYDROMORPHONE HCL 1 MG/ML IJ SOLN: 0.5000 mg | INTRAMUSCULAR | Status: DC | PRN

## 2018-12-28 MED ORDER — PROPOFOL 10 MG/ML IV BOLUS
INTRAVENOUS | Status: DC | PRN
  Administered 2018-12-28: 200 mg via INTRAVENOUS

## 2018-12-28 MED ORDER — FENTANYL CITRATE (PF) 100 MCG/2ML IJ SOLN
INTRAMUSCULAR | Status: DC | PRN
  Administered 2018-12-28 (×2): 50 ug via INTRAVENOUS

## 2018-12-28 MED ORDER — ONDANSETRON HCL 4 MG/2ML IJ SOLN: 4.0000 mg | Freq: Four times a day (QID) | INTRAMUSCULAR | Status: DC | PRN

## 2018-12-28 MED ORDER — METOCLOPRAMIDE HCL 5 MG/ML IJ SOLN: 5.0000 mg | Freq: Three times a day (TID) | INTRAMUSCULAR | Status: DC | PRN

## 2018-12-28 MED ORDER — ACETAMINOPHEN 10 MG/ML IV SOLN
1000.0000 mg | Freq: Once | INTRAVENOUS | Status: DC | PRN
  Administered 2018-12-28: 1000 mg via INTRAVENOUS

## 2018-12-28 MED ORDER — ACETAMINOPHEN 10 MG/ML IV SOLN
INTRAVENOUS | Status: AC
  Filled 2018-12-28: qty 100

## 2018-12-28 MED ORDER — FENTANYL CITRATE (PF) 100 MCG/2ML IJ SOLN
25.0000 ug | INTRAMUSCULAR | Status: DC | PRN
  Administered 2018-12-28: 25 ug via INTRAVENOUS

## 2018-12-28 MED ORDER — LIDOCAINE HCL (CARDIAC) PF 100 MG/5ML IV SOSY
PREFILLED_SYRINGE | INTRAVENOUS | Status: DC | PRN
  Administered 2018-12-28: 100 mg via INTRAVENOUS

## 2018-12-28 MED ORDER — ONDANSETRON HCL 4 MG PO TABS: 4.0000 mg | ORAL_TABLET | Freq: Four times a day (QID) | ORAL | Status: DC | PRN

## 2018-12-28 MED ORDER — LIDOCAINE 2% (20 MG/ML) 5 ML SYRINGE
INTRAMUSCULAR | Status: AC
  Filled 2018-12-28: qty 10

## 2018-12-28 SURGICAL SUPPLY — 32 items
BLADE SAW SGTL MED 73X18.5 STR (BLADE) IMPLANT
BLADE SURG 21 STRL SS (BLADE) ×3 IMPLANT
BNDG COHESIVE 4X5 TAN STRL (GAUZE/BANDAGES/DRESSINGS) ×3 IMPLANT
BNDG COHESIVE 6X5 TAN STRL LF (GAUZE/BANDAGES/DRESSINGS) ×2 IMPLANT
BNDG GAUZE ELAST 4 BULKY (GAUZE/BANDAGES/DRESSINGS) ×3 IMPLANT
COVER SURGICAL LIGHT HANDLE (MISCELLANEOUS) ×6 IMPLANT
COVER WAND RF STERILE (DRAPES) ×3 IMPLANT
DRAPE U-SHAPE 47X51 STRL (DRAPES) ×6 IMPLANT
DRSG ADAPTIC 3X8 NADH LF (GAUZE/BANDAGES/DRESSINGS) ×3 IMPLANT
DRSG PAD ABDOMINAL 8X10 ST (GAUZE/BANDAGES/DRESSINGS) ×6 IMPLANT
DURAPREP 26ML APPLICATOR (WOUND CARE) ×3 IMPLANT
ELECT REM PT RETURN 9FT ADLT (ELECTROSURGICAL) ×3
ELECTRODE REM PT RTRN 9FT ADLT (ELECTROSURGICAL) ×1 IMPLANT
GAUZE SPONGE 4X4 12PLY STRL (GAUZE/BANDAGES/DRESSINGS) ×3 IMPLANT
GAUZE SPONGE 4X4 12PLY STRL LF (GAUZE/BANDAGES/DRESSINGS) ×2 IMPLANT
GLOVE BIOGEL PI IND STRL 9 (GLOVE) ×1 IMPLANT
GLOVE BIOGEL PI INDICATOR 9 (GLOVE) ×2
GLOVE SURG ORTHO 9.0 STRL STRW (GLOVE) ×3 IMPLANT
GOWN STRL REUS W/ TWL XL LVL3 (GOWN DISPOSABLE) ×2 IMPLANT
GOWN STRL REUS W/TWL XL LVL3 (GOWN DISPOSABLE) ×4
KIT BASIN OR (CUSTOM PROCEDURE TRAY) ×3 IMPLANT
KIT TURNOVER KIT B (KITS) ×3 IMPLANT
NS IRRIG 1000ML POUR BTL (IV SOLUTION) ×3 IMPLANT
PACK ORTHO EXTREMITY (CUSTOM PROCEDURE TRAY) ×3 IMPLANT
PAD ABD 8X10 STRL (GAUZE/BANDAGES/DRESSINGS) ×2 IMPLANT
PAD ARMBOARD 7.5X6 YLW CONV (MISCELLANEOUS) ×6 IMPLANT
STOCKINETTE IMPERVIOUS LG (DRAPES) IMPLANT
SUT ETHILON 2 0 PSLX (SUTURE) ×3 IMPLANT
TOWEL GREEN STERILE (TOWEL DISPOSABLE) ×3 IMPLANT
TUBE CONNECTING 12'X1/4 (SUCTIONS) ×1
TUBE CONNECTING 12X1/4 (SUCTIONS) ×2 IMPLANT
YANKAUER SUCT BULB TIP NO VENT (SUCTIONS) ×3 IMPLANT

## 2018-12-28 NOTE — Anesthesia Procedure Notes (Signed)
Procedure Name: LMA Insertion Date/Time: 12/28/2018 2:37 PM Performed by: Lieutenant Diego, CRNA Pre-anesthesia Checklist: Patient identified, Emergency Drugs available, Suction available and Patient being monitored Patient Re-evaluated:Patient Re-evaluated prior to induction Oxygen Delivery Method: Circle system utilized Preoxygenation: Pre-oxygenation with 100% oxygen Induction Type: IV induction Ventilation: Mask ventilation without difficulty LMA: LMA inserted LMA Size: 5.0 Number of attempts: 1 Placement Confirmation: positive ETCO2 and breath sounds checked- equal and bilateral Tube secured with: Tape Dental Injury: Teeth and Oropharynx as per pre-operative assessment

## 2018-12-28 NOTE — Plan of Care (Signed)

## 2018-12-28 NOTE — Anesthesia Preprocedure Evaluation (Addendum)
Anesthesia Evaluation  Patient identified by MRN, date of birth, ID band Patient awake    Reviewed: Allergy & Precautions, NPO status , Patient's Chart, lab work & pertinent test results, reviewed documented beta blocker date and time   History of Anesthesia Complications Negative for: history of anesthetic complications  Airway Mallampati: III  TM Distance: >3 FB Neck ROM: Full    Dental  (+) Chipped,    Pulmonary former smoker,    Pulmonary exam normal breath sounds clear to auscultation       Cardiovascular hypertension, Pt. on medications and Pt. on home beta blockers Normal cardiovascular exam Rhythm:Regular Rate:Normal  Marfans  ECG: rate 71. Sinus rhythm with 1st degree A-V block   Neuro/Psych Blind  Neuromuscular disease negative psych ROS   GI/Hepatic negative GI ROS, Neg liver ROS,   Endo/Other  diabetes, Type 2, Oral Hypoglycemic Agents  Renal/GU negative Renal ROS     Musculoskeletal negative musculoskeletal ROS (+)   Abdominal   Peds  Hematology  (+) anemia , HLD   Anesthesia Other Findings Day of surgery medications reviewed with patient.  Reproductive/Obstetrics                            Anesthesia Physical Anesthesia Plan  ASA: III  Anesthesia Plan: General   Post-op Pain Management:    Induction: Intravenous  PONV Risk Score and Plan: Treatment may vary due to age or medical condition, Ondansetron and Dexamethasone  Airway Management Planned: LMA  Additional Equipment:   Intra-op Plan:   Post-operative Plan: Extubation in OR  Informed Consent: I have reviewed the patients History and Physical, chart, labs and discussed the procedure including the risks, benefits and alternatives for the proposed anesthesia with the patient or authorized representative who has indicated his/her understanding and acceptance.     Dental advisory given  Plan Discussed  with: CRNA  Anesthesia Plan Comments:        Anesthesia Quick Evaluation

## 2018-12-28 NOTE — Progress Notes (Signed)
Pt returned to room 6N15 after surgery. Received report from Danton Clap, RN in PACU. See reassessment. Will continue to monitor.

## 2018-12-28 NOTE — Progress Notes (Signed)
Pre-op report called and given to Darius Bump, RN in Short Stay and made aware that informed consent is not signed yet as pt requesting to speak with Sharol Given, MD. All questions answered to satisfaction. OR personnel to transport pt via bed.

## 2018-12-28 NOTE — Transfer of Care (Signed)
Immediate Anesthesia Transfer of Care Note  Patient: Chad Hall  Procedure(s) Performed: LEFT FOOT 3RD RAY AMPUTATION (Left )  Patient Location: PACU  Anesthesia Type:General  Level of Consciousness: awake, alert  and oriented  Airway & Oxygen Therapy: Patient Spontanous Breathing  Post-op Assessment: Report given to RN and Post -op Vital signs reviewed and stable  Post vital signs: Reviewed and stable  Last Vitals:  Vitals Value Taken Time  BP 158/79 12/28/18 1516  Temp 36.5 C 12/28/18 1516  Pulse 69 12/28/18 1522  Resp 13 12/28/18 1522  SpO2 100 % 12/28/18 1522  Vitals shown include unvalidated device data.  Last Pain:  Vitals:   12/28/18 1516  TempSrc:   PainSc: 0-No pain      Patients Stated Pain Goal: 4 (02/17/70 5366)  Complications: No apparent anesthesia complications

## 2018-12-28 NOTE — Interval H&P Note (Signed)
History and Physical Interval Note:  12/28/2018 7:15 AM  Chad Hall  has presented today for surgery, with the diagnosis of Osteomyelitis Left 3rd Ray.  The various methods of treatment have been discussed with the patient and family. After consideration of risks, benefits and other options for treatment, the patient has consented to  Procedure(s): LEFT FOOT 3RD RAY AMPUTATION (Left) as a surgical intervention.  [name] history has been reviewed, patient examined, no change in status, stable for surgery.  I have reviewed the patient's chart and labs.  Questions were answered to the patient's satisfaction.     Bevely Palmer [name]

## 2018-12-28 NOTE — Interval H&P Note (Signed)
History and Physical Interval Note:  12/28/2018 2:26 PM  Chad Hall  has presented today for surgery, with the diagnosis of Osteomyelitis Left 3rd Ray.  The various methods of treatment have been discussed with the patient and family. After consideration of risks, benefits and other options for treatment, the patient has consented to  Procedure(s): LEFT FOOT 3RD RAY AMPUTATION (Left) as a surgical intervention.  [name] history has been reviewed, patient examined, no change in status, stable for surgery.  I have reviewed the patient's chart and labs.  Questions were answered to the patient's satisfaction.     Newt Minion

## 2018-12-28 NOTE — Op Note (Signed)
12/28/2018  3:13 PM  PATIENT:  Chad Hall    PRE-OPERATIVE DIAGNOSIS:  Osteomyelitis Left 3rd Ray  POST-OPERATIVE DIAGNOSIS:  Same  PROCEDURE:  LEFT FOOT 3RD RAY AMPUTATION  SURGEON:  Newt Minion, MD  PHYSICIAN ASSISTANT:None ANESTHESIA:   General  PREOPERATIVE INDICATIONS:  [name] is a  68 y.o. male with a diagnosis of Osteomyelitis Left 3rd Ray who failed conservative measures and elected for surgical management.    The risks benefits and alternatives were discussed with the patient preoperatively including but not limited to the risks of infection, bleeding, nerve injury, cardiopulmonary complications, the need for revision surgery, among others, and the patient was willing to proceed.  OPERATIVE IMPLANTS: None  @ENCIMAGES @  OPERATIVE FINDINGS: Margins were not clear abscess extended beneath the first metatarsal and fourth metatarsal plantarly.  OPERATIVE PROCEDURE: Patient was brought the operating room underwent a general anesthetic.  After adequate levels anesthesia were obtained patient's left lower extremity was prepped using DuraPrep draped into a sterile field a timeout was called.  AV incision was made around the toe and the ulcerative tissue.  This left a wound that was 15 x 3 cm.  The third metatarsal was resected through the base.  There was no good petechial bleeding.  The abscess extended plantarly beneath the first metatarsal head and a rondure was used to further excise the infected soft tissue plantarly.  There was further involved tissue laterally and this was also excised with a rondure.  The wound was irrigated with normal saline.  Local tissue rearrangement was used to close the wound that was 15 x 3 cm.  Wound was closed using 2-0 nylon.  A sterile dressing was applied patient was extubated taken the PACU in stable condition.   DISCHARGE PLANNING:  Antibiotic duration: Would continue IV antibiotics for 3 days and discharged on oral  doxycycline.  Weightbearing: Nonweightbearing on the left  Pain medication: Opioid pathway  Dressing care/ Wound VAC: Change dressing as needed  Ambulatory devices: Walker  Discharge to: Anticipate discharge to skilled nursing.  Follow-up: In the office 1 week post operative.

## 2018-12-28 NOTE — Progress Notes (Signed)
Family Medicine Teaching Service Daily Progress Note Intern Pager: [phone]  Patient name: Chad Hall Medical record number: [id number] Date of birth: 1950/07/16 Age: 68 y.o. Gender: male  Primary Care Provider: Arlyce Harman, DO Consultants: Orthopedic surgery Code Status: FULL  Pt Overview and Major Events to Date:  12/26/18 : Admitted, Ortho consult 12/27/18 Cardiothoracic surgery for TAA, CT Chest 12/28/18: left foot 3rd ray amputation    Assessment and Plan:  Chad Hall is a 68 y.o. male presenting with left foot ulcer. PMH is significant for Marfan syndrome, legally blind, hypertension, type 2 diabetes, hyperlipidemia  Sepsis secondary to diabetic ulcer  Osteomyelitis  Patient to have third ray left foot amputation today.  He remains afebrile.  Continue vancomycin, metronidazole, cefepime.  Wound cultures moderate E. Coli,  moderate A. Faecalis and a few Streptococcus mitis/oralis.  Blood culture showed no growth at 3 days.  Leukocytosis downtrending 11.4 from 13.3 yesterday.  Patient with continued left lower extremity edema will obtain left lower extremity Doppler to ensure there is no PE. -Ortho following, appreciate recommendations: Abx rec's  -Discontinued Zosyn(11/8-11/9) -Metronidazole (11/9 - ) -Cefepime (11/9 - ) -Continue vancomycin (11/8-)  -Oxycodone IR 5 mg every 6 hours as needed -Acetaminophen 650 mg every 6 hours as needed -Vitals per routine -Follow-up blood cultures no growth day 3 of 5 -Follow-up wound cultures : E coli, S.oralis/mitis, A.faecalis -  Marfans Syndrome  Blindness  TAA Cardiothoracic surgeon to see patient hopefully today.  CT chest ascending order greatest diameter 37 mm in the greatest diameter of the annulus is 26 mm.  These measurements are slightly lower than ECHO 45 mm.  -Cardiothoracic surgery consulted, appreciate recommendations -Be sensitive to patient's needs  Diabetes HbA1C 6.4.    Fasting glucose 96 this  morning.  Patient received 3 units of sliding scale insulin.  Home medication glipizide 10 mg daily, Jardiance 10 mg daily, Tradjenta 5 mg daily, Zocor 40 mg.  -Hold home medications -Sensitive sliding scale insulin  -Monitor CBGs -Changed statin to 80 mg of atorvastatin   Hypertension Normotensive today 128/68. Home medication Metoprolol succinate 12.5mg  once daily -Continue to monitor -Lopressor 12.5 mg BID   CKD Stage 3a Creatinine slightly improved from baseline today at 1.28.    Baseline around 1.5-1.6. -daily BMP -avoid nephrotoxic medications    FEN/GI: Modified diet, NPO at midnight PPx: Heparin  Disposition: Pending medical clearance  Subjective:  Chad Hall had no significant overnight events.  Objective: Temp:  [98.1 F (36.7 C)-99 F (37.2 C)] 98.3 F (36.8 C) (11/11 0644) Pulse Rate:  [70-89] 70 (11/11 0644) Resp:  [16-18] 16 (11/11 0644) BP: (112-162)/(63-81) 142/64 (11/11 0644) SpO2:  [95 %-100 %] 95 % (11/11 0644)  Physical Exam:  GEN: Pleasant elderly male, sitting upright in chair, no acute distress CV: Regular rate and rhythm, no murmurs appreciated RESP: no increased work of breathing, clear to ascultation bilaterally with no crackles, wheezes, or rhonchi  ABD: Soft, nontender, nondistended, bowel sounds present  MSK: Left foot: Circular plantar ulcer, lower extremity edema to the mid tibia, nontender to palpation; right foot: Nondraining 3 callus on plantar surface NEURO: grossly normal, moves all extremities appropriately, blind PSYCH: Normal affect and thought content    Laboratory: Recent Labs  Lab 12/26/18 0311 12/27/18 0246 12/28/18 0300  WBC 17.0* 13.3* 11.4*  HGB 10.2* 9.7* 9.5*  HCT 34.5* 31.9* 31.4*  PLT 274 270 285   Recent Labs  Lab 12/25/18 1819 12/26/18 0311 12/27/18 0246 12/28/18 0300  NA 139 141 142 143  K 4.1 4.1 4.4 4.3  CL 107 110 113* 113*  CO2 21* 17* 22 22  BUN 27* 33* 26* 24*  CREATININE 1.63*  1.54* 1.35* 1.28*  CALCIUM 9.0 8.5* 8.6* 8.5*  PROT 7.2  --  6.0*  --   BILITOT 1.3*  --  1.1  --   ALKPHOS 54  --  46  --   ALT 36  --  40  --   AST 27  --  30  --   GLUCOSE 216* 170* 106* 112*      Imaging/Diagnostic Tests: Ct Chest W Contrast  Result Date: 12/27/2018 CLINICAL DATA:  68 year old male with a history of foot wound/critical limb ischemia, osteomyelitis, cardiovascular risk factors, and history of aortic aneurysm EXAM: CT ANGIOGRAPHY CHEST WITH CONTRAST TECHNIQUE: Multidetector CT imaging of the chest was performed using the standard protocol during bolus administration of intravenous contrast. Multiplanar CT image reconstructions and MIPs were obtained to evaluate the vascular anatomy. CONTRAST:  72mL OMNIPAQUE IOHEXOL 300 MG/ML  SOLN COMPARISON:  None. FINDINGS: Cardiovascular: Heart: No cardiomegaly. No pericardial fluid/thickening. Calcifications of the left main, right coronary arteries. Aorta: The annulus estimated 26 mm, coronal images, on this non gated CT chest. The greatest estimated diameter of the sino-tubular junction 35 mm on the coronal reformatted images. Greatest diameter of the ascending aorta measures 37 mm. Atherosclerotic changes of the aortic arch. Branch vessels are patent. No dissection or aneurysm of the descending thoracic aorta. No periaortic inflammatory changes. Pulmonary arteries: Timing of the contrast bolus not optimized for evaluation of the distal pulmonary arteries, however, there are no filling defects of the main pulmonary artery, lobar arteries, or segmental pulmonary arteries. Mediastinum/Nodes: Small lymph nodes of the mediastinum, without enlargement. No comparison available. Unremarkable appearance of the thoracic esophagus. Unremarkable thoracic inlet. Lungs/Pleura: Central airways are clear. No pleural effusion. No confluent airspace disease. No pneumothorax. Upper Abdomen: No acute finding of the upper abdomen. Musculoskeletal: No acute  displaced fracture. Degenerative changes of the spine. No bony canal narrowing. No aggressive lytic or sclerotic lesions. Review of the MIP images confirms the above findings. IMPRESSION: No acute CT finding. Greatest diameter of the ascending aorta measures 37 mm. Greatest diameter of the annulus is estimated 26 mm on the coronal reformatted images, though the study is not cardiac gated. Aortic Atherosclerosis (ICD10-I70.0). Signed, Dulcy Fanny. Dellia Nims, RPVI [facility] Radiology Electronically Signed   By: Corrie Mckusick D.O.   On: 12/27/2018 16:49   Mr Foot Left W Wo Contrast  Result Date: 12/26/2018 CLINICAL DATA:  Foot swelling. EXAM: MRI OF THE LEFT FOREFOOT WITHOUT AND WITH CONTRAST TECHNIQUE: Multiplanar, multisequence MR imaging of the left forefoot was performed both before and after administration of intravenous contrast. CONTRAST:  66mL GADAVIST GADOBUTROL 1 MMOL/ML IV SOLN COMPARISON:  Radiographs dated 12/25/2018 FINDINGS: Bones/Joint/Cartilage There is irregular enhancement of the head of the third metatarsal with subtle decreased signal in the same area on T1 weighted imaging. This is consistent with osteomyelitis of the third metatarsal head. There is slight enhancement of the base of the proximal phalanx of the third toe with edema in the proximal phalanx. There is an effusion at the third MTP joint with gas in the fluid. Gas and fluid extends proximally along the extensor and flexor tendon sheaths of the third ray best seen on image 19 of series 4. No other significant bone abnormality. Specifically, the distal phalanx of the little  toe appears normal. Muscles and Tendons Infectious tenosynovitis involving the flexor and extensor tendons of the third ray as described above. Gas in the fluid surrounding the tendons adjacent to the third metatarsal. Soft tissues Soft tissue ulceration on the plantar aspect of the foot with the level of the head of  the third metatarsal. Enhancement of the adjacent soft tissues after contrast administration consistent with cellulitis. IMPRESSION: 1. Osteomyelitis of the head of the third metatarsal. Infection of the third MTP joint. 2. Infectious tenosynovitis of the flexor and extensor tendons of the third ray. 3. Soft tissue ulceration on the plantar aspect of the foot at the level of the head of the third metatarsal. Electronically Signed   By: Francene BoyersJames  Maxwell M.D.   On: 12/26/2018 11:05     Katha CabalBrimage, Deyona Soza, MD 12/28/2018, 8:32 AM PGY-1, [facility] FPTS Intern pager: [phone], text pages welcome

## 2018-12-29 ENCOUNTER — Inpatient Hospital Stay (HOSPITAL_COMMUNITY): Payer: Medicare Other

## 2018-12-29 ENCOUNTER — Encounter (HOSPITAL_COMMUNITY): Payer: Self-pay | Admitting: Orthopedic Surgery

## 2018-12-29 DIAGNOSIS — R609 Edema, unspecified: Secondary | ICD-10-CM

## 2018-12-29 DIAGNOSIS — M868X7 Other osteomyelitis, ankle and foot: Secondary | ICD-10-CM

## 2018-12-29 DIAGNOSIS — M869 Osteomyelitis, unspecified: Secondary | ICD-10-CM

## 2018-12-29 LAB — BASIC METABOLIC PANEL
Anion gap: 8 (ref 5–15)
BUN: 21 mg/dL (ref 8–23)
CO2: 22 mmol/L (ref 22–32)
Calcium: 8.3 mg/dL — ABNORMAL LOW (ref 8.9–10.3)
Chloride: 114 mmol/L — ABNORMAL HIGH (ref 98–111)
Creatinine, Ser: 1.14 mg/dL (ref 0.61–1.24)
GFR calc Af Amer: >60 mL/min (ref >60)
GFR calc non Af Amer: >60 mL/min (ref >60)
Glucose, Bld: 141 mg/dL — ABNORMAL HIGH (ref 70–99)
Potassium: 4.2 mmol/L (ref 3.5–5.1)
Sodium: 144 mmol/L (ref 135–145)

## 2018-12-29 LAB — AEROBIC CULTURE W GRAM STAIN (SUPERFICIAL SPECIMEN)

## 2018-12-29 LAB — CBC
HCT: 31.3 % — ABNORMAL LOW (ref 39.0–52.0)
Hemoglobin: 9.5 g/dL — ABNORMAL LOW (ref 13.0–17.0)
MCH: 29.1 pg (ref 26.0–34.0)
MCHC: 30.4 g/dL (ref 30.0–36.0)
MCV: 96 fL (ref 80.0–100.0)
Platelets: 316 10*3/uL (ref 150–400)
RBC: 3.26 MIL/uL — ABNORMAL LOW (ref 4.22–5.81)
RDW: 11.5 % (ref 11.5–15.5)
WBC: 10.6 10*3/uL — ABNORMAL HIGH (ref 4.0–10.5)
nRBC: 0 % (ref 0.0–0.2)

## 2018-12-29 LAB — GLUCOSE, CAPILLARY
Glucose-Capillary: 203 mg/dL — ABNORMAL HIGH (ref 70–99)
Glucose-Capillary: 147 mg/dL — ABNORMAL HIGH (ref 70–99)
Glucose-Capillary: 197 mg/dL — ABNORMAL HIGH (ref 70–99)
Glucose-Capillary: 164 mg/dL — ABNORMAL HIGH (ref 70–99)

## 2018-12-29 MED ORDER — CEFAZOLIN SODIUM-DEXTROSE 2-4 GM/100ML-% IV SOLN
2.0000 g | Freq: Three times a day (TID) | INTRAVENOUS | Status: AC
  Administered 2018-12-29 – 2018-12-31 (×8): 2 g via INTRAVENOUS
  Filled 2018-12-29 (×8): qty 100

## 2018-12-29 MED ORDER — HEPARIN SODIUM (PORCINE) 5000 UNIT/ML IJ SOLN
5000.0000 [IU] | Freq: Three times a day (TID) | INTRAMUSCULAR | Status: DC
  Administered 2018-12-29 – 2019-01-01 (×9): 5000 [IU] via SUBCUTANEOUS
  Filled 2018-12-29 (×9): qty 1

## 2018-12-29 NOTE — Anesthesia Postprocedure Evaluation (Signed)
Anesthesia Post Note  Patient: Chad Hall  Procedure(s) Performed: LEFT FOOT 3RD RAY AMPUTATION (Left )     Patient location during evaluation: PACU Anesthesia Type: General Level of consciousness: awake and alert Pain management: pain level controlled Vital Signs Assessment: post-procedure vital signs reviewed and stable Respiratory status: spontaneous breathing, nonlabored ventilation, respiratory function stable and patient connected to nasal cannula oxygen Cardiovascular status: blood pressure returned to baseline and stable Postop Assessment: no apparent nausea or vomiting Anesthetic complications: no    Last Vitals:  Vitals:   12/29/18 0153 12/29/18 0500  BP: (!) 150/68 (!) 142/66  Pulse: 68 66  Resp: 17 17  Temp: 37.1 C 36.9 C  SpO2: 98% 100%    Last Pain:  Vitals:   12/29/18 0500  TempSrc: Oral  PainSc:                  Andreina Outten L Chyrl Elwell

## 2018-12-29 NOTE — Progress Notes (Signed)
Orthopedic Tech Progress Note Patient Details:  Chad Hall Jan 10, 1951 [id number] Applied post op shoe to patient. Also got him a fresh cup of coffee Ortho Devices Type of Ortho Device: Postop shoe/boot Ortho Device/Splint Location: LLE Ortho Device/Splint Interventions: Adjustment, Application, Ordered   Post Interventions Patient Tolerated: Well Instructions Provided: Care of device, Adjustment of device   Janit Pagan 12/29/2018, 9:40 AM

## 2018-12-29 NOTE — Progress Notes (Signed)
Bilateral lower extremity venous duplex has been completed. Preliminary results can be found in CV Proc through chart review.   12/29/18 9:32 AM Carlos Levering RVT

## 2018-12-29 NOTE — Progress Notes (Signed)
POD 1 3rd Ray amputation. Pain controlled , patient alert. VSS . Dressing with some min bloody drainage at toes. Compartments soft  Based on surgical findings recommend 3 days post op Iv antibiotics followed by 1 month doxycycline. Follow up Dr. Sharol Given 1 week. Dressing change if needed tomorrow. Will get post op shoe. Should be NWB  For most part but can place a little weight on heel for balance and transfers if needed

## 2018-12-29 NOTE — Progress Notes (Addendum)
Pharmacy Antibiotic Note  Chad Hall is a 68 y.o. male admitted on 12/25/2018 with  osteomyelitis now presumed source-controlled  after 3rd ray toe amputation on 11/11. Pharmacy has been consulted for Vancomycin and Cefepime dosing. Renal function has improved, WBCs trending down, afebrile. Surgery Sharol Given, MD) requests 3 days post-op coverage with IV antibiotics, then switch to oral doxycycline x 1 month.   Based on cx data, the treatment team has decided to narrow therapy to Cefazolin.    Plan: Vancomycin, Cefepime, and Flagyl have been discontinued. Start Cefazolin 2g IV q8h. End date 11/14. Also would recommend outpatient coverage with Keflex PO x 1 month (instead of Doxycycline) based on cx data.   Will reach out to Dr. Sharol Given to discuss.  Continue to monitor for signs of clinical improvement in infectious disease parameters, including WBC and temperature.  Height: 6' 5.01" (195.6 cm) Weight: 230 lb (104.3 kg) IBW/kg (Calculated) : 89.12  Temp (24hrs), Avg:98.3 F (36.8 C), Min:97.7 F (36.5 C), Max:98.8 F (37.1 C)  Recent Labs  Lab 12/25/18 1819 12/26/18 0056 12/26/18 0311 12/27/18 0246 12/28/18 0300 12/29/18 0512  WBC 15.8*  --  17.0* 13.3* 11.4* 10.6*  CREATININE 1.63*  --  1.54* 1.35* 1.28* 1.14  LATICACIDVEN 1.2 1.1  --   --   --   --     Estimated Creatinine Clearance: 76 mL/min (by C-G formula based on SCr of 1.14 mg/dL).    No Known Allergies  Antimicrobials this admission: Vanc 11/9 >> 11/12 Zosyn 11/9 x1 Cefepime 11/9 >> 11/12 Flagyl 11/9 >> 11/12 Cefazolin 11/11 x1 (surge prophy), 11/12>> (11/14)  Dose adjustments this admission:  Microbiology results: 11/9 Wound (superficial): GPC & GNR on stain, Moderate E coli, Moderate Alcaligenes faecalis, Few strep mitis/oralis 11/9 COVID: neg 11/8 BCx: ngtd  Thank you for allowing pharmacy to be a part of this patient's care.  [place] 12/29/2018 7:42 AM   I discussed /  reviewed the pharmacy note by Ms. Devarion Mcclanahan and I agree with the student's findings and plans as documented.  [employer], Pharm.D., BCPS Clinical Pharmacist Clinical phone for 12/29/2018 from 8:30-4:00 is [phone].  **Pharmacist phone directory can now be found on [email or web address] ([email or web address]).  Listed under [place].  12/29/2018 9:47 AM

## 2018-12-29 NOTE — Progress Notes (Signed)
Family Medicine Teaching Service Daily Progress Note Intern Pager: [phone]  Patient name: Chad Hall Medical record number: [id number] Date of birth: 28-Jan-1951 Age: 68 y.o. Gender: male  Primary Care Provider: Arlyce Harman, DO Consultants: Orthopedic surgery Code Status: FULL  Pt Overview and Major Events to Date:  12/26/18 : Admitted, Ortho consult 12/27/18 Cardiothoracic surgery for TAA, CT Chest 12/28/18: left foot 3rd ray amputation    Antibiotics - Zosyn (11/8-11/9) - Metronidazole (11/9 - 11/12) - Cefepime (11/9 - 11/12) - Vancomycin (11/8-11/12)  - Ancef (11/12 - )    Assessment and Plan:  Chad Hall is a 68 y.o. male presenting with left foot ulcer. PMH is significant for Marfan syndrome, legally blind, hypertension, type 2 diabetes, hyperlipidemia  Sepsis secondary to diabetic ulcer  Osteomyelitis  S/P Third ray amputation Patient remains afebrile and pain is controlled.  Did not require pain medications overnight. Postop day 2, continue with Ancef.  Patient to be discharged with Keflex as it is better suited for patient's wound culture sensitivities.  Blood culture showed no growth at 5 days.  Patient has postop shoe. -Ortho following, appreciated recommendations -Ancef (11/12 - ) -Keflex at discharge -Oxycodone IR 5 mg Q6H as needed -Acetaminophen 650 mg Q6Hs as needed -Vitals per routine -Follow-up blood cultures no growth day 5 of 5 -Wound cultures : E coli, S.oralis/mitis, A.faecalis - PT / OT eval and treat   Marfans Syndrome  Blindness  TAA Hopefully, cardiothoracic surgery will see patient today if not patient to be seen outpatient..  CT chest ascending aorta greatest diameter 37 mm in the greatest diameter of the annulus is 26 mm.  These measurements are slightly lower than ECHO 45 mm.  -Cardiothoracic surgery consulted, appreciate recommendations   Diabetes HbA1C 6.4. Fasting glucose 119 this morning.  Patient received 8 units  of sliding scale insulin.  Home medication glipizide 10 mg daily, Jardiance 10 mg daily, Tradjenta 5 mg daily, Zocor 40 mg.  -Hold home medications -Sensitive sliding scale insulin  -Monitor CBGs -Changed statin to 80 mg of atorvastatin   Hypertension Hypertensive today.  Home medication Metoprolol succinate 12.5mg  once daily -Continue to monitor -Lopressor 12.5 mg BID   CKD Stage 3a Creatinine slightly improved from baseline today.    Baseline around 1.5-1.6. -daily BMP -avoid nephrotoxic medications    FEN/GI: Carb modified diet  PPx: Heparin   Disposition: Pending medical clearance, Home with PT   Subjective:  Chad Hall had no significant overnight events.     Post op Day 2.   Objective: Temp:  [97.8 F (36.6 C)-98.2 F (36.8 C)] 97.8 F (36.6 C) (11/13 0500) Pulse Rate:  [58-89] 67 (11/13 0500) Resp:  [17-18] 17 (11/13 0500) BP: (143-162)/(67-80) 162/70 (11/13 0500) SpO2:  [98 %-100 %] 99 % (11/13 0500)  Physical Exam:  GEN: pleasant elderly male, in no acute distress  CV: regular rate and rhythm, no murmurs appreciated  RESP: no increased work of breathing, clear to ascultation bilaterally ABD: Soft, nontender, nondistended MSK: no lower extremity edema on the right, left foot dressing clean dry and intact  NEURO: Blind, moving all extremities appropriately     Laboratory: Recent Labs  Lab 12/27/18 0246 12/28/18 0300 12/29/18 0512  WBC 13.3* 11.4* 10.6*  HGB 9.7* 9.5* 9.5*  HCT 31.9* 31.4* 31.3*  PLT 270 285 316   Recent Labs  Lab 12/25/18 1819  12/27/18 0246 12/28/18 0300 12/29/18 0512  NA 139   < > 142 143 144  K 4.1   < > 4.4 4.3 4.2  CL 107   < > 113* 113* 114*  CO2 21*   < > 22 22 22   BUN 27*   < > 26* 24* 21  CREATININE 1.63*   < > 1.35* 1.28* 1.14  CALCIUM 9.0   < > 8.6* 8.5* 8.3*  PROT 7.2  --  6.0*  --   --   BILITOT 1.3*  --  1.1  --   --   ALKPHOS 54  --  46  --   --   ALT 36  --  40  --   --   AST 27  --  30  --    --   GLUCOSE 216*   < > 106* 112* 141*   < > = values in this interval not displayed.      Imaging/Diagnostic Tests: Vas [place] Lower Extremity Venous (dvt)  Result Date: 12/29/2018  Lower Venous Study Indications: Edema.  Risk Factors: None identified. Limitations: Bandages. Comparison Study: No prior studies. Performing Technologist: Chanda BusingGregory Collins RVT  Examination Guidelines: A complete evaluation includes B-mode imaging, spectral Doppler, color Doppler, and power Doppler as needed of all accessible portions of each vessel. Bilateral testing is considered an integral part of a complete examination. Limited examinations for reoccurring indications may be performed as noted.  +---------+---------------+---------+-----------+----------+--------------+ RIGHT    CompressibilityPhasicitySpontaneityPropertiesThrombus Aging +---------+---------------+---------+-----------+----------+--------------+ CFV      Full           Yes      Yes                                 +---------+---------------+---------+-----------+----------+--------------+ SFJ      Full                                                        +---------+---------------+---------+-----------+----------+--------------+ FV Prox  Full                                                        +---------+---------------+---------+-----------+----------+--------------+ FV Mid   Full                                                        +---------+---------------+---------+-----------+----------+--------------+ FV DistalFull                                                        +---------+---------------+---------+-----------+----------+--------------+ PFV      Full                                                        +---------+---------------+---------+-----------+----------+--------------+  POP      Full           Yes      Yes                                  +---------+---------------+---------+-----------+----------+--------------+ PTV      Full                                                        +---------+---------------+---------+-----------+----------+--------------+ PERO     Full                                                        +---------+---------------+---------+-----------+----------+--------------+   +---------+---------------+---------+-----------+----------+--------------+ LEFT     CompressibilityPhasicitySpontaneityPropertiesThrombus Aging +---------+---------------+---------+-----------+----------+--------------+ CFV      Full           Yes      Yes                                 +---------+---------------+---------+-----------+----------+--------------+ SFJ      Full                                                        +---------+---------------+---------+-----------+----------+--------------+ FV Prox  Full                                                        +---------+---------------+---------+-----------+----------+--------------+ FV Mid   Full                                                        +---------+---------------+---------+-----------+----------+--------------+ FV DistalFull                                                        +---------+---------------+---------+-----------+----------+--------------+ PFV      Full                                                        +---------+---------------+---------+-----------+----------+--------------+ POP      Full           Yes      Yes                                 +---------+---------------+---------+-----------+----------+--------------+  PTV      Full                                                        +---------+---------------+---------+-----------+----------+--------------+ PERO     Full                                                         +---------+---------------+---------+-----------+----------+--------------+     Summary: Right: There is no evidence of deep vein thrombosis in the lower extremity. No cystic structure found in the popliteal fossa. Left: There is no evidence of deep vein thrombosis in the lower extremity. No cystic structure found in the popliteal fossa.  *See table(s) above for measurements and observations. Electronically signed by Servando Snare MD on 12/29/2018 at 3:29:54 PM.    Final      Lyndee Hensen, MD 12/30/2018, 9:56 AM PGY-1, [facility] Intern pager: [phone], text pages welcome

## 2018-12-29 NOTE — TOC Initial Note (Signed)
Transition of Care [facility]) - Initial/Assessment Note    Patient Details  Name: Chad Hall MRN: [id number] Date of Birth: 10-Oct-1950  Transition of Care [facility]) CM/SW Contact:    Doy Hutching, LCSWA Phone Number: 12/29/2018, 11:02 AM  Clinical Narrative:                 CSW spoke with pt at bedside, introduced self, role, reason for visit. Pt with pertinent medical hx of blindness and Marfan's Syndrome. Pt from home with wife and he has a seeing eye dog (lab) that helps him get around when he does not use his sight cane. Pt independent before admission with assistance as needed. We discussed that there is a recommendation for services at discharge. His preference would be to return home with home health but he will discuss with his wife and wants to see how he progresses with therapy (he feels there were many distractions during therapy today). Pt plans to discuss options with his wife and understands someone from [facility] team will come and visit again.   Expected Discharge Plan: Home w Home Health Services Barriers to Discharge: Insurance Authorization, Continued Medical Work up   Patient Goals and CMS Choice Patient states their goals for this hospitalization and ongoing recovery are:: to see how I progress and hopefully go home CMS Medicare.gov Compare Post Acute Care list provided to:: Patient Choice offered to / list presented to : Patient  Expected Discharge Plan and Services Expected Discharge Plan: Home w Home Health Services In-house Referral: Clinical Social Work Discharge Planning Services: CM Consult Post Acute Care Choice: NA(undecided at this time) Living arrangements for the past 2 months: Single Family Home    Prior Living Arrangements/Services Living arrangements for the past 2 months: Single Family Home Lives with:: Spouse, Pets Patient language and need for interpreter reviewed:: Yes(no needs) Do you feel safe going back to the place where you live?: Yes       Need for Family Participation in Patient Care: Yes (Comment)(assistance with ADL/IADLs as needed; supervision only if needed) Care giver support system in place?: Yes (comment)(spouse, seeing eye dog) Current home services: DME, Other (comment)(seeing eye dog) Criminal Activity/Legal Involvement Pertinent to Current Situation/Hospitalization: No - Comment as needed  Activities of Daily Living      Permission Sought/Granted Permission sought to share information with : Family Supports Permission granted to share information with : No(pt states he will call and discuss with his wife)  Share Information with NAME: Odel Schmid     Permission granted to share info w Relationship: wife  Permission granted to share info w Contact Information: [phone]  Emotional Assessment Appearance:: Appears stated age, Developmentally appropriate Attitude/Demeanor/Rapport: Gracious, Engaged Affect (typically observed): Accepting, Adaptable, Pleasant Orientation: : Oriented to Self, Oriented to Place, Oriented to  Time, Oriented to Situation Alcohol / Substance Use: Not Applicable Psych Involvement: No (comment)  Admission diagnosis:  Other osteomyelitis of left foot [facility]) [M86.8X7] Patient Active Problem List   Diagnosis Date Noted  . Osteomyelitis of foot, left, acute (HCC) 12/26/2018  . Right knee pain 10/04/2017  . Left hip pain 04/10/2013  . Diabetic peripheral neuropathy associated with type 2 diabetes mellitus (HCC) 11/10/2012  . Marfan's syndrome 08/08/2012  . Healthcare maintenance 08/08/2012  . Atopic dermatitis 02/16/2012  . Overweight (BMI 25.0-29.9) 07/05/2011  . Allergic rhinitis due to allergen 07/03/2011  . Venous insufficiency of leg 04/03/2011  . Blindness 07/23/2010  . DM (diabetes mellitus), type 2 with neurological complications (HCC)  06/13/2010  . Hypertension 06/13/2010  . Hyperlipidemia 06/13/2010   PCP:  Nuala Alpha, DO Pharmacy:   [place], [place] [address] Phone: [phone] Fax: [phone]  [id number] - [place], [place] - [address] [place] [address] Phone: [phone] Fax: [place], [place] [facility] [address] [place] [address] [place] [address] Phone: [phone] Fax: [phone]     Social Determinants of Health ([place]) Interventions    Readmission Risk Interventions Readmission Risk Prevention Plan 12/29/2018  Post Dischage Appt Not Complete  Appt Comments dispo pending  Medication Screening Complete  Transportation Screening Complete  Some recent data might be hidden

## 2018-12-29 NOTE — Progress Notes (Signed)
Family Medicine Teaching Service Daily Progress Note Intern Pager: [phone]  Patient name: Chad Hall Medical record number: [id number] Date of birth: 03/31/1950 Age: 68 y.o. Gender: male  Primary Care Provider: Nuala Alpha, DO Consultants: Orthopedic surgery Code Status: FULL  Pt Overview and Major Events to Date:  12/26/18 : Admitted, Ortho consult 12/27/18 Cardiothoracic surgery for TAA, CT Chest 12/28/18: left foot 3rd ray amputation    Antibiotics - Zosyn (11/8-11/9) - Metronidazole (11/9 - 11/12) - Cefepime (11/9 - 11/12) - Vancomycin (11/8-11/12)    Assessment and Plan:  Chad Hall is a 68 y.o. male presenting with left foot ulcer. PMH is significant for Marfan syndrome, legally blind, hypertension, type 2 diabetes, hyperlipidemia  Sepsis secondary to diabetic ulcer  Osteomyelitis  Patient had left third ray foot amputation yesterday.  Postop day 1. Ortho recommends patient continue with IV antibiotics x3 post op and transition to oral therapy for 1 month.  Discontinue cefepime, metronidazole, vancomycin and switch to Ancef today. Wound cultures moderate E. Coli,  moderate A. faecalis and a few Streptococcus mitis/oralis.  Per sensitivities, doxycycline will not cover for strep mitis/oralis.  Discharge patient on Keflex.  Blood culture showed no growth at 4 days.  Lower extremity Dopplers did not show evidence of DVT.  -Ortho following, appreciated recommendations -Ancef (11/12 - ) -Discontinued vancomycin, cefepime, metronidazole -Keflex at discharge -Oxycodone IR 5 mg Q6H as needed -Acetaminophen 650 mg Q6Hs as needed -Vitals per routine -Follow-up blood cultures no growth day 4 of 5 -Wound cultures : E coli, S.oralis/mitis, A.faecalis - PT / OT eval and treat   Marfans Syndrome  Blindness  TAA Cardiothoracic surgeon to see patient hopefully today.  CT chest ascending aorta greatest diameter 37 mm in the greatest diameter of the annulus is 26 mm.   These measurements are slightly lower than ECHO 45 mm.  -Cardiothoracic surgery consulted, appreciate recommendations   Diabetes HbA1C 6.4. Fasting glucose 203 this morning.  Patient received 2 units of sliding scale insulin.  Home medication glipizide 10 mg daily, Jardiance 10 mg daily, Tradjenta 5 mg daily, Zocor 40 mg.  -Hold home medications -Sensitive sliding scale insulin  -Monitor CBGs -Changed statin to 80 mg of atorvastatin   Hypertension BP 142/66 today. Home medication Metoprolol succinate 12.5mg  once daily -Continue to monitor -Lopressor 12.5 mg BID   CKD Stage 3a Creatinine slightly improved from baseline today at 1.14.    Baseline around 1.5-1.6. -daily BMP -avoid nephrotoxic medications    FEN/GI: Carb modified diet  PPx: Heparin   Disposition: Pending medical clearance, Home with PT   Subjective:  Chad Hall no overnight significant events.  Postop day 1.  Objective: Temp:  [97.7 F (36.5 C)-98.8 F (37.1 C)] 98.5 F (36.9 C) (11/12 0500) Pulse Rate:  [59-71] 66 (11/12 0500) Resp:  [14-18] 17 (11/12 0500) BP: (128-163)/(66-79) 142/66 (11/12 0500) SpO2:  [98 %-100 %] 100 % (11/12 0500) FiO2 (%):  [21 %] 21 % (11/11 1620) Weight:  [104.3 kg] 104.3 kg (11/11 1245)  Physical Exam:  GEN: Pleasant elderly male, sitting upright in chair, no acute distress CV: Regular rate and rhythm, no murmurs appreciated RESP: no increased work of breathing, clear to ascultation bilaterally with no crackles, wheezes, or rhonchi  ABD: Soft, nontender, nondistended, bowel sounds present  MSK: Left foot: Circular plantar ulcer, lower extremity edema to the mid tibia, nontender to palpation; right foot: Nondraining 3 callus on plantar surface NEURO: grossly normal, moves all extremities appropriately, blind PSYCH:  Normal affect and thought content    Laboratory: Recent Labs  Lab 12/27/18 0246 12/28/18 0300 12/29/18 0512  WBC 13.3* 11.4* 10.6*  HGB 9.7*  9.5* 9.5*  HCT 31.9* 31.4* 31.3*  PLT 270 285 316   Recent Labs  Lab 12/25/18 1819  12/27/18 0246 12/28/18 0300 12/29/18 0512  NA 139   < > 142 143 144  K 4.1   < > 4.4 4.3 4.2  CL 107   < > 113* 113* 114*  CO2 21*   < > 22 22 22   BUN 27*   < > 26* 24* 21  CREATININE 1.63*   < > 1.35* 1.28* 1.14  CALCIUM 9.0   < > 8.6* 8.5* 8.3*  PROT 7.2  --  6.0*  --   --   BILITOT 1.3*  --  1.1  --   --   ALKPHOS 54  --  46  --   --   ALT 36  --  40  --   --   AST 27  --  30  --   --   GLUCOSE 216*   < > 106* 112* 141*   < > = values in this interval not displayed.      Imaging/Diagnostic Tests: Ct Chest W Contrast  Result Date: 12/27/2018 CLINICAL DATA:  68 year old male with a history of foot wound/critical limb ischemia, osteomyelitis, cardiovascular risk factors, and history of aortic aneurysm EXAM: CT ANGIOGRAPHY CHEST WITH CONTRAST TECHNIQUE: Multidetector CT imaging of the chest was performed using the standard protocol during bolus administration of intravenous contrast. Multiplanar CT image reconstructions and MIPs were obtained to evaluate the vascular anatomy. CONTRAST:  70mL OMNIPAQUE IOHEXOL 300 MG/ML  SOLN COMPARISON:  None. FINDINGS: Cardiovascular: Heart: No cardiomegaly. No pericardial fluid/thickening. Calcifications of the left main, right coronary arteries. Aorta: The annulus estimated 26 mm, coronal images, on this non gated CT chest. The greatest estimated diameter of the sino-tubular junction 35 mm on the coronal reformatted images. Greatest diameter of the ascending aorta measures 37 mm. Atherosclerotic changes of the aortic arch. Branch vessels are patent. No dissection or aneurysm of the descending thoracic aorta. No periaortic inflammatory changes. Pulmonary arteries: Timing of the contrast bolus not optimized for evaluation of the distal pulmonary arteries, however, there are no filling defects of the main pulmonary artery, lobar arteries, or segmental pulmonary  arteries. Mediastinum/Nodes: Small lymph nodes of the mediastinum, without enlargement. No comparison available. Unremarkable appearance of the thoracic esophagus. Unremarkable thoracic inlet. Lungs/Pleura: Central airways are clear. No pleural effusion. No confluent airspace disease. No pneumothorax. Upper Abdomen: No acute finding of the upper abdomen. Musculoskeletal: No acute displaced fracture. Degenerative changes of the spine. No bony canal narrowing. No aggressive lytic or sclerotic lesions. Review of the MIP images confirms the above findings. IMPRESSION: No acute CT finding. Greatest diameter of the ascending aorta measures 37 mm. Greatest diameter of the annulus is estimated 26 mm on the coronal reformatted images, though the study is not cardiac gated. Aortic Atherosclerosis (ICD10-I70.0). Signed, 72m. Yvone Neu, RPVI [facility] Radiology Electronically Signed   By: [facility] D.O.   On: 12/27/2018 16:49     13/11/2018, MD 12/29/2018, 7:51 AM PGY-1, [place] [facility] FPTS Intern pager: [phone], text pages welcome

## 2018-12-29 NOTE — Evaluation (Signed)
Occupational Therapy Evaluation Patient Details Name: Chad VERVILLE MRN: [id number] DOB: 01-08-1951 Today's Date: 12/29/2018    History of Present Illness Pt is a 68 y.o. M admitted with a diagnosis of osteomyelitis of the L foot. Surgical intervention included a 3rd ray amputation. He has a PMH including but not limited to hypertension, Marfan syndrome, and diabetes mellitus type 2. He is legally blind.    Clinical Impression   Pt PTA: Pt is blind at baseline and has seeing eye dog at home; lives with spouse and was working and independent prior. Pt uses blind stick for mobility without dog. Pt currently limited by blindness in a new environment and NWB status of LLE. supervisionA to minA for ADL in sitting as pt in unfamiliar with environment. Pt minA for sit to stand for stability with RW; pt taking a few steps around the bed with minimal wt bear through L heel. Pt's PA asked for pt to be the most NWB as possible for healing, but pt can wt bear through heel for balance and transfers. Pt to go home with w/c to ensure safety. Pt set-upA with UB ADL and minA for LB ADL. Pt would benefit from continued OT skilled services for ADL, mobility and safety. OT following.    Follow Up Recommendations  Home health OT;Supervision/Assistance - 24 hour    Equipment Recommendations  3 in 1 bedside commode    Recommendations for Other Services       Precautions / Restrictions Precautions Precaution Comments: NWB on LLE Required Braces or Orthoses: Other Brace Other Brace: darco shoe Restrictions Weight Bearing Restrictions: Yes      Mobility Bed Mobility Overal bed mobility: Needs Assistance Bed Mobility: Rolling;Supine to Sit;Sit to Supine Rolling: Min guard   Supine to sit: Min guard Sit to supine: Min guard   General bed mobility comments: Pt able to complete bed mobility min guard for safety and line management  Transfers Overall transfer level: Needs assistance Equipment used:  Rolling walker (2 wheeled) Transfers: Sit to/from Stand Sit to Stand: Min assist         General transfer comment: Pt required some help for hand placement on the bed and when standing with RW, but was able to maintain minimal WB through L heel during transfer.    Balance Overall balance assessment: Needs assistance Sitting-balance support: Feet supported;Feet unsupported;No upper extremity supported;Single extremity supported Sitting balance-Leahy Scale: Good Sitting balance - Comments: Pt able to sit EOB on his own   Standing balance support: Bilateral upper extremity supported;During functional activity Standing balance-Leahy Scale: Poor Standing balance comment: Difficulty with NWB status- pt applying slight pressure to heel for transfers                           ADL either performed or assessed with clinical judgement   ADL Overall ADL's : Needs assistance/impaired Eating/Feeding: Set up;Sitting   Grooming: Min guard;Standing   Upper Body Bathing: Set up;Sitting   Lower Body Bathing: Min guard;Minimal assistance;Sitting/lateral leans;Sit to/from stand   Upper Body Dressing : Set up;Sitting   Lower Body Dressing: Minimal assistance;Sitting/lateral leans;Sit to/from stand   Toilet Transfer: Minimal assistance;Cueing for safety;Stand-pivot   Toileting- Clothing Manipulation and Hygiene: Minimal assistance;Sitting/lateral lean;Sit to/from stand       Functional mobility during ADLs: Min guard;Minimal assistance;Rolling walker;Cueing for sequencing General ADL Comments: Pt limited by blindness in new setting, L foot pain and decreased activity tolerance.  Vision Baseline Vision/History: Legally blind Patient Visual Report: No change from baseline Additional Comments: b/l blindness      Perception     Praxis      Pertinent Vitals/Pain Pain Assessment: Faces Faces Pain Scale: No hurt Pain Location: L foot Pain Intervention(s): Monitored during  session     Hand Dominance Left   Extremity/Trunk Assessment Upper Extremity Assessment Upper Extremity Assessment: Overall WFL for tasks assessed   Lower Extremity Assessment Lower Extremity Assessment: Overall WFL for tasks assessed;LLE deficits/detail LLE Deficits / Details: s/p toe amputation   Cervical / Trunk Assessment Cervical / Trunk Assessment: Normal   Communication Communication Communication: No difficulties   Cognition Arousal/Alertness: Awake/alert Behavior During Therapy: WFL for tasks assessed/performed Overall Cognitive Status: Within Functional Limits for tasks assessed                                     General Comments  Pt plans to have spouse at home for 24/7 care.    Exercises     Shoulder Instructions      Home Living Family/patient expects to be discharged to:: Private residence Living Arrangements: Spouse/significant other Available Help at Discharge: Family Type of Home: House Home Access: Stairs to enter Secretary/administrator of Steps: 2 Entrance Stairs-Rails: None Home Layout: One level     Bathroom Shower/Tub: Producer, television/film/video: Handicapped height Bathroom Accessibility: Yes   Home Equipment: None          Prior Functioning/Environment Level of Independence: Independent                 OT Problem List: Decreased strength;Decreased activity tolerance;Impaired balance (sitting and/or standing);Pain      OT Treatment/Interventions: Self-care/ADL training;Therapeutic exercise;Neuromuscular education;Energy conservation;Therapeutic activities    OT Goals(Current goals can be found in the care plan section) Acute Rehab OT Goals Patient Stated Goal: to improve mobility OT Goal Formulation: With patient Time For Goal Achievement: 01/12/19 Potential to Achieve Goals: Good ADL Goals Pt Will Perform Lower Body Dressing: sit to/from stand;sitting/lateral leans;with min guard assist Pt Will  Transfer to Toilet: with min guard assist;stand pivot transfer;bedside commode Additional ADL Goal #1: Pt will increase to supervisionA for ADL functional transfers.  OT Frequency: Min 2X/week   Barriers to D/C:            Co-evaluation PT/OT/SLP Co-Evaluation/Treatment: Yes Reason for Co-Treatment: Complexity of the patient's impairments (multi-system involvement)   OT goals addressed during session: ADL's and self-care;Proper use of Adaptive equipment and DME      AM-PAC OT "6 Clicks" Daily Activity     Outcome Measure Help from another person eating meals?: A Little Help from another person taking care of personal grooming?: A Little Help from another person toileting, which includes using toliet, bedpan, or urinal?: A Little Help from another person bathing (including washing, rinsing, drying)?: A Little Help from another person to put on and taking off regular upper body clothing?: None Help from another person to put on and taking off regular lower body clothing?: A Little 6 Click Score: 19   End of Session Equipment Utilized During Treatment: Gait belt;Rolling walker Nurse Communication: Mobility status  Activity Tolerance: Patient tolerated treatment well Patient left: in bed;with call bell/phone within reach;Other (comment)(pt having test done- no bed alarm set)  OT Visit Diagnosis: Unsteadiness on feet (R26.81);Muscle weakness (generalized) (M62.81)  Time: [id number] OT Time Calculation (min): 21 min Charges:  OT General Charges $OT Visit: 1 Visit OT Evaluation $OT Eval Moderate Complexity: 1 Mod  Cristi LoronAllison ([place]) [place] OTR/L Acute Rehabilitation Services Pager: [phone] Office: [phone]   Lonzo CloudLLISON J Kenan Moodie 12/29/2018, 3:20 PM

## 2018-12-29 NOTE — Evaluation (Addendum)
Physical Therapy Evaluation Patient Details Name: Chad Hall MRN: [id number] DOB: Apr 23, 1950 Today's Date: 12/29/2018   History of Present Illness  Pt is a 67 y.o. M admitted with a diagnosis of osteomyelitis of the L foot. Surgical intervention included a 3rd ray amputation. He has a PMH including but not limited to hypertension, Marfan syndrome, and diabetes mellitus type 2. He is legally blind.   Clinical Impression  Pt tolerated evaluation activities well today. He was able to maintain NWB through foot with only a little WB for mobility with minimal cueing. Contacted PA about NWB versus heel-WB. She stated they want very little WB through the heel. Confirmed this information with the patient. Throughout bed mobility, transfers, and gait, he denies increase of pain in L foot. Discussed having a urinal at home with recliner due to difficulty navigating a walker or wheelchair at home. Recommend D/C to Home with HHPT due to combination of visual impairment and WB restrictions on L foot. Also recommend wheelchair for outdoor mobility or when he leaves his house. He has impairments in L LE strength, gait, balance, and overall functional mobility. Pt would benefit from continued skilled PT intervention to address deficits.     Follow Up Recommendations Home health PT    Equipment Recommendations  Rolling walker with 5" wheels;Wheelchair cushion (measurements PT);Wheelchair (measurements PT)    Recommendations for Other Services       Precautions / Restrictions Precautions Required Braces or Orthoses: (Post-op shoe) Restrictions Weight Bearing Restrictions: Yes(NWB on L, can put a little weight through L heel)      Mobility  Bed Mobility Overal bed mobility: Needs Assistance Bed Mobility: Rolling;Supine to Sit;Sit to Supine Rolling: Min guard   Supine to sit: Min guard Sit to supine: Min guard   General bed mobility comments: Pt able to complete bed mobility min guard for  safety and line management  Transfers Overall transfer level: Needs assistance Equipment used: Rolling walker (2 wheeled) Transfers: Sit to/from Stand Sit to Stand: Min assist         General transfer comment: Pt required some help for hand placement on the bed and when standing with RW, but was able to maintain minimal WB through L foot durin transfer.  Ambulation/Gait Ambulation/Gait assistance: Min assist Gait Distance (Feet): 10 Feet Assistive device: Rolling walker (2 wheeled) Gait Pattern/deviations: Step-to pattern;Decreased step length - right;Decreased step length - left Gait velocity: decr   General Gait Details: Pt required min A during gait training for RW management and navigating the room due to visual impairment, but was relatively steady on his feet and maintained minimal WB on L throughout ambulation.  Stairs            Wheelchair Mobility    Modified Rankin (Stroke Patients Only)       Balance Overall balance assessment: Needs assistance Sitting-balance support: Feet supported;Feet unsupported;No upper extremity supported;Single extremity supported Sitting balance-Leahy Scale: Good Sitting balance - Comments: Pt able to sit EOB on his own   Standing balance support: Bilateral upper extremity supported;During functional activity Standing balance-Leahy Scale: Poor Standing balance comment: Pt requires support from RW during standing/ambulation due to NWB status                             Pertinent Vitals/Pain Pain Assessment: Faces Faces Pain Scale: No hurt Pain Location: L foot    Home Living Family/patient expects to be discharged to:: Private residence Living  Arrangements: Spouse/significant other Available Help at Discharge: Family Type of Home: House Home Access: Stairs to enter Entrance Stairs-Rails: None Entrance Stairs-Number of Steps: 2 Home Layout: One level Home Equipment: None      Prior Function Level of  Independence: Independent               Hand Dominance   Dominant Hand: Left    Extremity/Trunk Assessment        Lower Extremity Assessment Lower Extremity Assessment: Overall WFL for tasks assessed;LLE deficits/detail LLE Deficits / Details: unable to fully assess due to NWB status       Communication   Communication: No difficulties  Cognition Arousal/Alertness: Awake/alert Behavior During Therapy: WFL for tasks assessed/performed Overall Cognitive Status: Within Functional Limits for tasks assessed                                        General Comments      Exercises     Assessment/Plan    PT Assessment Patient needs continued PT services  PT Problem List Decreased strength;Decreased range of motion;Decreased activity tolerance;Decreased balance;Decreased mobility;Decreased knowledge of use of DME;Decreased safety awareness;Decreased knowledge of precautions;Pain       PT Treatment Interventions DME instruction;Gait training;Stair training;Functional mobility training;Therapeutic activities;Therapeutic exercise;Balance training;Neuromuscular re-education;Patient/family education;Wheelchair mobility training;Modalities    PT Goals (Current goals can be found in the Care Plan section)  Acute Rehab PT Goals Patient Stated Goal: to improve mobility PT Goal Formulation: With patient Time For Goal Achievement: 12/31/18 Potential to Achieve Goals: Good    Frequency Min 3X/week   Barriers to discharge   None at this time    Co-evaluation PT/OT/SLP Co-Evaluation/Treatment: Yes Reason for Co-Treatment: To address functional/ADL transfers PT goals addressed during session: Mobility/safety with mobility;Balance;Proper use of DME OT goals addressed during session: Proper use of Adaptive equipment and DME       AM-PAC PT "6 Clicks" Mobility  Outcome Measure Help needed turning from your back to your side while in a flat bed without using  bedrails?: A Little Help needed moving from lying on your back to sitting on the side of a flat bed without using bedrails?: A Little Help needed moving to and from a bed to a chair (including a wheelchair)?: A Little Help needed standing up from a chair using your arms (e.g., wheelchair or bedside chair)?: A Little Help needed to walk in hospital room?: A Little Help needed climbing 3-5 steps with a railing? : A Lot 6 Click Score: 17    End of Session Equipment Utilized During Treatment: Gait belt Activity Tolerance: Patient tolerated treatment well;No increased pain Patient left: in bed;with call bell/phone within reach Nurse Communication: Mobility status PT Visit Diagnosis: Unsteadiness on feet (R26.81);Other abnormalities of gait and mobility (R26.89);Muscle weakness (generalized) (M62.81)    Time: 7616-0737 PT Time Calculation (min) (ACUTE ONLY): 20 min   Charges:   PT Evaluation $PT Eval Moderate Complexity: 1 [address] SPT   [place] Cinde Ebert 12/29/2018, 11:01 AM

## 2018-12-29 NOTE — Plan of Care (Signed)
  Problem: Education: Goal: Knowledge of General Education information will improve Description: Including pain rating scale, medication(s)/side effects and non-pharmacologic comfort measures Outcome: Progressing   Problem: Health Behavior/Discharge Planning: Goal: Ability to manage health-related needs will improve Outcome: Progressing   Problem: Activity: Goal: Risk for activity intolerance will decrease Outcome: Progressing   Problem: Nutrition: Goal: Adequate nutrition will be maintained Outcome: Progressing   Problem: Elimination: Goal: Will not experience complications related to urinary retention Outcome: Progressing   Problem: Pain Managment: Goal: General experience of comfort will improve Outcome: Progressing   Problem: Safety: Goal: Ability to remain free from injury will improve Outcome: Progressing   Problem: Skin Integrity: Goal: Risk for impaired skin integrity will decrease Outcome: Progressing   

## 2018-12-30 LAB — BASIC METABOLIC PANEL
Anion gap: 11 (ref 5–15)
BUN: 19 mg/dL (ref 8–23)
CO2: 22 mmol/L (ref 22–32)
Calcium: 8.6 mg/dL — ABNORMAL LOW (ref 8.9–10.3)
Chloride: 110 mmol/L (ref 98–111)
Creatinine, Ser: 1.13 mg/dL (ref 0.61–1.24)
GFR calc Af Amer: >60 mL/min (ref >60)
GFR calc non Af Amer: >60 mL/min (ref >60)
Glucose, Bld: 131 mg/dL — ABNORMAL HIGH (ref 70–99)
Potassium: 4.1 mmol/L (ref 3.5–5.1)
Sodium: 143 mmol/L (ref 135–145)

## 2018-12-30 LAB — CULTURE, BLOOD (ROUTINE X 2)
Culture: NO GROWTH
Culture: NO GROWTH
Special Requests: ADEQUATE
Special Requests: ADEQUATE

## 2018-12-30 LAB — CBC
HCT: 33.9 % — ABNORMAL LOW (ref 39.0–52.0)
Hemoglobin: 10.4 g/dL — ABNORMAL LOW (ref 13.0–17.0)
MCH: 29.4 pg (ref 26.0–34.0)
MCHC: 30.7 g/dL (ref 30.0–36.0)
MCV: 95.8 fL (ref 80.0–100.0)
Platelets: 360 10*3/uL (ref 150–400)
RBC: 3.54 MIL/uL — ABNORMAL LOW (ref 4.22–5.81)
RDW: 11.4 % — ABNORMAL LOW (ref 11.5–15.5)
WBC: 12.2 10*3/uL — ABNORMAL HIGH (ref 4.0–10.5)
nRBC: 0 % (ref 0.0–0.2)

## 2018-12-30 LAB — GLUCOSE, CAPILLARY
Glucose-Capillary: 128 mg/dL — ABNORMAL HIGH (ref 70–99)
Glucose-Capillary: 119 mg/dL — ABNORMAL HIGH (ref 70–99)
Glucose-Capillary: 181 mg/dL — ABNORMAL HIGH (ref 70–99)
Glucose-Capillary: 159 mg/dL — ABNORMAL HIGH (ref 70–99)

## 2018-12-30 MED ORDER — CEPHALEXIN 500 MG PO CAPS: 500.0000 mg | ORAL_CAPSULE | Freq: Three times a day (TID) | ORAL | 0 refills | Status: DC

## 2018-12-30 MED FILL — CEPHALEXIN 500 MG CAPSULE: 500 | 14 days supply | Qty: 42 | Fill #0

## 2018-12-30 NOTE — Progress Notes (Addendum)
Physical Therapy Treatment Patient Details Name: Chad Hall MRN: [id number] DOB: Feb 21, 1950 Today's Date: 12/30/2018    History of Present Illness Pt is a 68 y.o. M admitted with a diagnosis of osteomyelitis of the L foot. Surgical intervention included a 3rd ray amputation. He has a PMH including but not limited to hypertension, Marfan syndrome, and diabetes mellitus type 2. He is legally blind.     PT Comments    Pt is pleasant upon arrival and agreeable to PT. He continues to deny L foot pain and stated that he was able to walk to the bathroom while getting a bath with post-op shoe yesterday. He notes that he is eager to get up and move. He was able to complete bed mobility with supervision for safety and min guard for transfers and ambulation. He does well in maintaining his WB status on the L while ambulating in the room and tolerated LE exercises well once back in the chair. He is motivated to improve and notes that he wants to ensure his technique with his exercises was good. Overall, he is progressing well at this time due to improvement in assistance level with bed mobility, transfers, and gait. Pt would benefit from continued skilled PT services to improve functional mobility.   Follow Up Recommendations  Home health PT     Equipment Recommendations  Rolling walker with 5" wheels;Wheelchair Therapist, occupational with anti-tippers, desk armrests, and foot rest);Wheelchair cushion (18x18 pressure-relieving cushion)    Recommendations for Other Services       Precautions / Restrictions Precautions Precautions: Fall Required Braces or Orthoses: Other Brace Other Brace: post-op shoe Restrictions Weight Bearing Restrictions: Yes LLE Weight Bearing: Non weight bearing(can put some weight on heel for transfers while wearing shoe)    Mobility  Bed Mobility Overal bed mobility: Needs Assistance Bed Mobility: Rolling;Supine to Sit;Sit to Supine Rolling: Supervision   Supine  to sit: Supervision Sit to supine: Supervision   General bed mobility comments: Pt is moving quite well and needs supervision in terms of mobility. Because of visual impairment, he needs additional cueing for safety.  Transfers Overall transfer level: Needs assistance Equipment used: Rolling walker (2 wheeled) Transfers: Sit to/from Stand Sit to Stand: Min guard         General transfer comment: Pt required some help for hand placement on bed and RW but was able to mantain WB status through L heel during transfer.  Ambulation/Gait Ambulation/Gait assistance: Min guard Gait Distance (Feet): 20 Feet Assistive device: Rolling walker (2 wheeled) Gait Pattern/deviations: Step-through pattern;Decreased step length - right;Decreased step length - left Gait velocity: decr   General Gait Details: In terms of mobility, he was able to ambualte min guard to the door and back while maintaining NWB on L toes. He requires cueing for safety and when to turn the walker since he is unfamiliar with the environment, but again moved quite well.    Stairs             Wheelchair Mobility    Modified Rankin (Stroke Patients Only)       Balance Overall balance assessment: Needs assistance Sitting-balance support: Feet supported;Feet unsupported;No upper extremity supported;Single extremity supported Sitting balance-Leahy Scale: Good Sitting balance - Comments: Pt able to sit EOB on his own   Standing balance support: Bilateral upper extremity supported;During functional activity Standing balance-Leahy Scale: Poor Standing balance comment: Difficulty with NWB status- pt applying slight pressure to heel for transfers. He requires support while standing.  Cognition Arousal/Alertness: Awake/alert Behavior During Therapy: WFL for tasks assessed/performed Overall Cognitive Status: Within Functional Limits for tasks assessed                                         Exercises General Exercises - Lower Extremity Ankle Circles/Pumps: AROM;Both;10 reps;Seated Quad Sets: Strengthening;Both;10 reps;Seated Long Arc Quad: AROM;Strengthening;Both;10 reps;Seated    General Comments        Pertinent Vitals/Pain Pain Assessment: No/denies pain Pain Location: L foot Pain Intervention(s): Monitored during session    Home Living                      Prior Function            PT Goals (current goals can now be found in the care plan section) Acute Rehab PT Goals Patient Stated Goal: to improve mobility Progress towards PT goals: Progressing toward goals    Frequency    Min 3X/week      PT Plan Current plan remains appropriate    Co-evaluation              AM-PAC PT "6 Clicks" Mobility   Outcome Measure  Help needed turning from your back to your side while in a flat bed without using bedrails?: None Help needed moving from lying on your back to sitting on the side of a flat bed without using bedrails?: None Help needed moving to and from a bed to a chair (including a wheelchair)?: A Little Help needed standing up from a chair using your arms (e.g., wheelchair or bedside chair)?: A Little Help needed to walk in hospital room?: A Little Help needed climbing 3-5 steps with a railing? : A Lot 6 Click Score: 19    End of Session Equipment Utilized During Treatment: Gait belt Activity Tolerance: Patient tolerated treatment well;No increased pain Patient left: in chair;with call bell/phone within reach;with chair alarm set Nurse Communication: Mobility status PT Visit Diagnosis: Unsteadiness on feet (R26.81);Other abnormalities of gait and mobility (R26.89);Muscle weakness (generalized) (M62.81)     Time: 8299-3716 PT Time Calculation (min) (ACUTE ONLY): 15 min  Charges:  $Gait Training: 8-22 mins                    Silvana Newness, SPT    [place] Arlin Sass 12/30/2018, 12:08 PM

## 2018-12-30 NOTE — Progress Notes (Signed)
POD 2 s/p 3rd Ray Amputation. Overall doing well denies pain. Dressing  Some bloody drainage at toes, no more than yesterday. Toes warm . VSS   Stable. Will need follow up in 1 week Dr Sharol Given.

## 2018-12-31 LAB — BASIC METABOLIC PANEL
Anion gap: 11 (ref 5–15)
BUN: 16 mg/dL (ref 8–23)
CO2: 24 mmol/L (ref 22–32)
Calcium: 8.5 mg/dL — ABNORMAL LOW (ref 8.9–10.3)
Chloride: 106 mmol/L (ref 98–111)
Creatinine, Ser: 1.14 mg/dL (ref 0.61–1.24)
GFR calc Af Amer: >60 mL/min (ref >60)
GFR calc non Af Amer: >60 mL/min (ref >60)
Glucose, Bld: 130 mg/dL — ABNORMAL HIGH (ref 70–99)
Potassium: 3.9 mmol/L (ref 3.5–5.1)
Sodium: 141 mmol/L (ref 135–145)

## 2018-12-31 LAB — CBC
HCT: 30.8 % — ABNORMAL LOW (ref 39.0–52.0)
Hemoglobin: 9.7 g/dL — ABNORMAL LOW (ref 13.0–17.0)
MCH: 29.8 pg (ref 26.0–34.0)
MCHC: 31.5 g/dL (ref 30.0–36.0)
MCV: 94.5 fL (ref 80.0–100.0)
Platelets: 343 10*3/uL (ref 150–400)
RBC: 3.26 MIL/uL — ABNORMAL LOW (ref 4.22–5.81)
RDW: 11.5 % (ref 11.5–15.5)
WBC: 11.7 10*3/uL — ABNORMAL HIGH (ref 4.0–10.5)
nRBC: 0.2 % (ref 0.0–0.2)

## 2018-12-31 LAB — GLUCOSE, CAPILLARY
Glucose-Capillary: 127 mg/dL — ABNORMAL HIGH (ref 70–99)
Glucose-Capillary: 166 mg/dL — ABNORMAL HIGH (ref 70–99)
Glucose-Capillary: 190 mg/dL — ABNORMAL HIGH (ref 70–99)
Glucose-Capillary: 114 mg/dL — ABNORMAL HIGH (ref 70–99)

## 2018-12-31 NOTE — Progress Notes (Signed)
If needed Please page [phone] to contact family medicine service in regards to this patient  Blue Ruggerio MD PGY-3 Family Medicine Resident 

## 2018-12-31 NOTE — Progress Notes (Signed)
Occupational Therapy Treatment Patient Details Name: Chad Hall MRN: [id number] DOB: 02/21/1950 Today's Date: 12/31/2018    History of present illness Pt is a 68 y.o. M admitted with a diagnosis of osteomyelitis of the L foot. Surgical intervention included a 3rd ray amputation. He has a PMH including but not limited to hypertension, Marfan syndrome, and diabetes mellitus type 2. He is legally blind.    OT comments  Pt. Seen for skilled OT session.  Finishing standing grooming tasks in b.room upon arrival.  Assistance provided by  2 nursing students that were present.  Aware of wbs and state pt. Able to maintain and no LOB noted.  Good follow through of precautions during return to bed.  Pt. Able to complete LB dressing tasks with out difficulty.  Note d/c home likely soon.    Follow Up Recommendations  Home health OT;Supervision/Assistance - 24 hour    Equipment Recommendations  3 in 1 bedside commode    Recommendations for Other Services      Precautions / Restrictions Precautions Precaution Comments: NWB on LLE Other Brace: post-op shoe Restrictions Weight Bearing Restrictions: Yes LLE Weight Bearing: Non weight bearing       Mobility Bed Mobility                  Transfers   Equipment used: Rolling walker (2 wheeled) Transfers: Sit to/from Stand;Stand Pivot Transfers Sit to Stand: Min guard Stand pivot transfers: Min guard       General transfer comment: Pt required some help for hand placement on bed and RW but was able to mantain WB status through L heel during transfer.    Balance                                           ADL either performed or assessed with clinical judgement   ADL Overall ADL's : Needs assistance/impaired     Grooming: Min guard;Standing Grooming Details (indicate cue type and reason): pt. found in b.room with 2 student RNs finishing grooming tasks. education provided on wbs, all state he has been  following it during the portion i was not present for             Lower Body Dressing: Supervision/safety;Sitting/lateral leans Lower Body Dressing Details (indicate cue type and reason): able to cross each leg over knee for don/doff socks, reports no issues     Toileting- Clothing Manipulation and Hygiene: Min guard;Cueing for sequencing;Cueing for compensatory techniques Toileting - Clothing Manipulation Details (indicate cue type and reason): simulated with return to eob.       General ADL Comments: pt. moving well, cont. education on wbs.  pt. doing better with new setting with being blind. able to vocalize his needs and how he wants items set up on his tray.     Vision       Perception     Praxis      Cognition Arousal/Alertness: Awake/alert Behavior During Therapy: WFL for tasks assessed/performed Overall Cognitive Status: Within Functional Limits for tasks assessed                                          Exercises     Shoulder Instructions       General Comments  Pertinent Vitals/ Pain          Home Living                                          Prior Functioning/Environment              Frequency  Min 2X/week        Progress Toward Goals  OT Goals(current goals can now be found in the care plan section)        Plan      Co-evaluation                 AM-PAC OT "6 Clicks" Daily Activity     Outcome Measure   Help from another person eating meals?: A Little Help from another person taking care of personal grooming?: A Little Help from another person toileting, which includes using toliet, bedpan, or urinal?: A Little Help from another person bathing (including washing, rinsing, drying)?: A Little Help from another person to put on and taking off regular upper body clothing?: None Help from another person to put on and taking off regular lower body clothing?: A Little 6 Click Score:  19    End of Session Equipment Utilized During Treatment: Rolling walker  OT Visit Diagnosis: Unsteadiness on feet (R26.81);Muscle weakness (generalized) (M62.81)   Activity Tolerance Patient tolerated treatment well   Patient Left in bed;with call bell/phone within reach;with nursing/sitter in room   Nurse Communication          Time: 6384-6659 OT Time Calculation (min): 15 min  Charges: OT General Charges $OT Visit: 1 Visit OT Treatments $Self Care/Home Management : 8-22 mins   Robet Leu 12/31/2018, 12:29 PM

## 2018-12-31 NOTE — Plan of Care (Signed)
  Problem: Education: Goal: Knowledge of General Education information will improve Description: Including pain rating scale, medication(s)/side effects and non-pharmacologic comfort measures Outcome: Progressing   Problem: Health Behavior/Discharge Planning: Goal: Ability to manage health-related needs will improve Outcome: Progressing   Problem: Clinical Measurements: Goal: Ability to maintain clinical measurements within normal limits will improve Outcome: Progressing Goal: Will remain free from infection Outcome: Progressing Goal: Respiratory complications will improve Outcome: Progressing   Problem: Activity: Goal: Risk for activity intolerance will decrease Outcome: Progressing   Problem: Nutrition: Goal: Adequate nutrition will be maintained Outcome: Progressing   Problem: Coping: Goal: Level of anxiety will decrease Outcome: Progressing   Problem: Elimination: Goal: Will not experience complications related to bowel motility Outcome: Progressing Goal: Will not experience complications related to urinary retention Outcome: Progressing   Problem: Pain Managment: Goal: General experience of comfort will improve Outcome: Progressing   Problem: Safety: Goal: Ability to remain free from injury will improve Outcome: Progressing   Problem: Skin Integrity: Goal: Risk for impaired skin integrity will decrease Outcome: Progressing   

## 2018-12-31 NOTE — Discharge Summary (Addendum)
[facility] Discharge Summary  Patient name: Chad Hall Medical record number: [id number] Date of birth: 08-27-1950 Age: 68 y.o. Gender: male Date of Admission: 12/25/2018  Date of Discharge: 01/01/2019 Admitting Physician: Martyn Malay, MD  Primary Care Provider: Nuala Alpha, DO Consultants: Orthopedic surgery  Indication for Hospitalization: Osteomyelitis of left foot  Discharge Diagnoses/Problem List:  Patient Active Problem List   Diagnosis Date Noted  . Pyogenic inflammation of bone ([place])   . Osteomyelitis of foot, left, acute ([place]) 12/26/2018  . Right knee pain 10/04/2017  . Left hip pain 04/10/2013  . Diabetic peripheral neuropathy associated with type 2 diabetes mellitus ([place]) 11/10/2012  . Marfan's syndrome 08/08/2012  . Healthcare maintenance 08/08/2012  . Atopic dermatitis 02/16/2012  . Overweight (BMI 25.0-29.9) 07/05/2011  . Allergic rhinitis due to allergen 07/03/2011  . Venous insufficiency of leg 04/03/2011  . Blindness 07/23/2010  . DM (diabetes mellitus), type 2 with neurological complications ([place]) 69/62/9528  . Hypertension 06/13/2010  . Hyperlipidemia 06/13/2010    Disposition: Discharge Home  Discharge Condition: Stable, Improved  Discharge Exam:  General: pleasant patient seen sitting upright in bed, nontoxic appearing, no apparent distress Cardiac: RRR, S1-S2 present, no murmurs appreciated Respiratory: CTA bilaterally, normal work of breathing, no adventitious sounds appreciated Abdomen: Soft, nontender, normal bowel sounds appreciated Neuro: No focal neurological deficits appreciated  Brief Hospital Course:   Chad Hall is a 68 y.o. male who presented to the ED after having 3 weeks of left foot pain and acutely coming febrile.  Patient stated that he had a callus under his left foot he had scratched at and it began to drain. In the ED patient was found to be febrile at 100.3 F. Zosyn and vancomycin  were started and were changed to vancomycin, metronidazole and cefepime.  Wound cultures grew gram-positive cocci and gram-positive rods and antibiotics were narrowed to Ancef.  Left foot MRI was significant for third MTP osteomyelitis and tenosynovitis.  Bilateral lower extremity venous duplex was unremarkable. Orthopedic surgery performed left third ray amputation on 12/28/2018. Patient received 3 days of IV antibiotics postoperatively.  Patient is stable for discharge home with 4 weeks of Keflex and Home Health with PT.   Issues for Follow Up:   Patient needs to take Keflex for 4 weeks date of discharge.  Ensure that patient has received his antibiotics and is taking them.  Patient is to follow-up with Dr. Sharol Given, orthopedic surgeon in 1 week.  Patient needs ambulatory referral to cardiothoracic surgery for his thoracic aortic aneurysm.  Dr. Orvan Seen was contacted but he did not get a chance to see the patient in the hospital.  Patient is to ambulate with walker as he continues to heal.    Significant Procedures:  12/28/2018: third ray amputation on the left foot   Significant Labs and Imaging:  Recent Labs  Lab 12/29/18 0512 12/30/18 1111 12/31/18 0256  WBC 10.6* 12.2* 11.7*  HGB 9.5* 10.4* 9.7*  HCT 31.3* 33.9* 30.8*  PLT 316 360 343   Recent Labs  Lab 12/25/18 1819  12/27/18 0246 12/28/18 0300 12/29/18 0512 12/30/18 1111 12/31/18 0256  NA 139   < > 142 143 144 143 141  K 4.1   < > 4.4 4.3 4.2 4.1 3.9  CL 107   < > 113* 113* 114* 110 106  CO2 21*   < > 22 22 22 22 24   GLUCOSE 216*   < > 106* 112* 141* 131* 130*  BUN 27*   < > 26* 24* 21 19 16   CREATININE 1.63*   < > 1.35* 1.28* 1.14 1.13 1.14  CALCIUM 9.0   < > 8.6* 8.5* 8.3* 8.6* 8.5*  ALKPHOS 54  --  46  --   --   --   --   AST 27  --  30  --   --   --   --   ALT 36  --  40  --   --   --   --   ALBUMIN 2.9*  --  2.2*  --   --   --   --    < > = values in this interval not displayed.    Results/Tests Pending at Time  of Discharge: None  Discharge Medications:  Allergies as of 01/01/2019   No Known Allergies     Medication List    STOP taking these medications   meloxicam 15 MG tablet Commonly known as: MOBIC   simvastatin 40 MG tablet Commonly known as: ZOCOR     TAKE these medications   acetaminophen 325 MG tablet Commonly known as: TYLENOL Take 2 tablets (650 mg total) by mouth every 6 (six) hours as needed for mild pain (or Fever >/= 101).   atorvastatin 80 MG tablet Commonly known as: LIPITOR Take 1 tablet (80 mg total) by mouth daily at 6 PM.   cephALEXin 500 MG capsule Commonly known as: KEFLEX Take 1 capsule (500 mg total) by mouth 3 (three) times daily for 16 days. Start taking on: January 15, 2019 PATIENT TO TAKE KEFLEX FROM 01/01/2019 - 01/31/2019  cholecalciferol 25 MCG (1000 UT) tablet Commonly known as: VITAMIN D3 Take 1,000 Units by mouth at bedtime.   docusate sodium 100 MG capsule Commonly known as: COLACE Take 1 capsule (100 mg total) by mouth daily as needed for mild constipation.   enalapril 20 MG tablet Commonly known as: VASOTEC TAKE 1 TABLET BY MOUTH  DAILY What changed: when to take this   glipiZIDE 10 MG tablet Commonly known as: GLUCOTROL TAKE 1 TABLET BY MOUTH ONCE A DAY BEFORE NIGHT TIME MEAL What changed:   how much to take  how to take this  when to take this  additional instructions   Jardiance 10 MG Tabs tablet Generic drug: empagliflozin TAKE 1 TABLET BY MOUTH  DAILY What changed: how much to take   metoprolol succinate 25 MG 24 hr tablet Commonly known as: TOPROL-XL TAKE ONE-HALF TABLET BY  MOUTH DAILY What changed: when to take this   ondansetron 4 MG tablet Commonly known as: ZOFRAN Take 1 tablet (4 mg total) by mouth every 8 (eight) hours as needed for nausea or vomiting.   oxyCODONE 5 MG immediate release tablet Commonly known as: Oxy IR/ROXICODONE Take 1 tablet (5 mg total) by mouth every 6 (six) hours as needed for  severe pain (pain score 6-10).   Prodigy No Coding Blood Gluc test strip Generic drug: glucose blood USE TO CHECK BLOOD SUGAR  THREE TIMES DAILY OR AS  DIRECTED   Prodigy Twist Top Lancets 28G Misc CHECK BLOOD SUGAR 3 TIMES  DAILY   Tradjenta 5 MG Tabs tablet Generic drug: linagliptin TAKE 1 TABLET BY MOUTH  DAILY What changed:   how much to take  when to take this      Discharge Instructions: Please refer to Patient Instructions section of EMR for full details.  Patient was counseled important signs and symptoms that should prompt return  to medical care, changes in medications, dietary instructions, activity restrictions, and follow up appointments.   Follow-Up Appointments: Follow-up Information    Nadara Mustarduda, Marcus V, MD In 1 week.   Specialty: Orthopedic Surgery Contact information: [address] [place] [address] [phone]          Dollene Clevelandnderson, Caytlin Better C, DO 01/01/2019, 11:03 AM PGY-2, [facility]

## 2018-12-31 NOTE — Progress Notes (Signed)
Family Medicine Teaching Service Daily Progress Note Intern Pager: [phone]  Patient name: Chad Hall [place]well Medical record number: [id number] Date of birth: 09/25/1950 Age: 68 y.o. Gender: male  Primary Care Provider: Arlyce HarmanLockamy, Timothy, DO Consultants: Orthopedic surgery Code Status: FULL  Pt Overview and Major Events to Date:  12/26/18 : Admitted, Ortho consult 12/27/18 Cardiothoracic surgery for TAA, CT Chest 12/28/18: left foot 3rd ray amputation  12/29/18: transition to Ancef 01/01/19: transition to Keflex x 4 weeks   Antibiotics - Zosyn (11/8-11/9) - Metronidazole (11/9 - 11/12) - Cefepime (11/9 - 11/12) - Vancomycin (11/8-11/12)  - Ancef (11/12- )    Assessment and Plan:  Chad Hall [place] is a 68 y.o. male presenting with left foot ulcer. PMH is significant for Marfan syndrome, legally blind, hypertension, type 2 diabetes, hyperlipidemia  Sepsis secondary to diabetic ulcer  Osteomyelitis  S/P Third ray amputation Patient remains comfortable and did not require   Did not require pain medications overnight. Postop day 3, continue with Ancef.  Patient to be discharged with Keflex as it is better suited for patient's wound culture sensitivities.  Blood culture showed no growth at 5 days.   Patient evaluated daily by PT, who is currently recommending HH PT. -Ortho following, appreciated recommendations -Ancef (11/12-11/14) -Keflex (11/15-12/15 500 mg TID) per ortho recommendations, will give four weeks at discharge and confirm with pharmacy that this was sent -Oxycodone IR 5 mg Q6H as needed -Acetaminophen 650 mg Q6Hs as needed -Vitals per routine -Follow-up blood cultures no growth day 5 of 5 - PT / OT eval and treat  Marfans Syndrome  Blindness  TAA Patient will likely be seen by CVTS on an outpatient basis.  CT chest ascending aorta greatest diameter 37 mm in the greatest diameter of the annulus is 26 mm.  These measurements are slightly lower than ECHO 45 mm.  -f/u  with CVTS outpatient  Diabetes HbA1C 6.4. Fasting glucose 130 on 11/14.  Patient received 5 units of sliding scale insulin.  Home medication glipizide 10 mg daily, Jardiance 10 mg daily, Tradjenta 5 mg daily, Zocor 40 mg.  -Hold home medications -Sensitive sliding scale insulin  -Monitor CBGs -Changed statin to 80 mg of atorvastatin  Hypertension BP 163/71 on 11/14.  Home medication Metoprolol succinate 12.5mg  once daily -Continue to monitor -Lopressor 12.5 mg BID   CKD Stage 3a Creatinine is 1.14, GFR >60 on 11/14.  Consider changing this diagnosis to stage 2. -no more labs -avoid nephrotoxic medications  FEN/GI: Carb modified diet  PPx: Heparin   Disposition: Pending medical clearance, Home with PT   Subjective:  Chad Hall [name] reports that he feels comfortable and denies pain.  Reports that PT went well yesterday.  Objective: Temp:  [97.8 F (36.6 C)-98.2 F (36.8 C)] 98.1 F (36.7 C) (11/13 2119) Pulse Rate:  [67-81] 68 (11/13 2119) Resp:  [17-18] 17 (11/13 2119) BP: (159-169)/(70-76) 169/71 (11/13 2119) SpO2:  [99 %-100 %] 100 % (11/13 2119)  Physical Exam:  GEN: pleasant male lying comfortably in bed  CV: RRR, no MRG RESP: in increased WOB, CTAB ABD: soft, nondistended, normal bowel sounds MSK: left foot dressing c/d/i, no pedal edema bilaterally  Laboratory: Recent Labs  Lab 12/28/18 0300 12/29/18 0512 12/30/18 1111  WBC 11.4* 10.6* 12.2*  HGB 9.5* 9.5* 10.4*  HCT 31.4* 31.3* 33.9*  PLT 285 316 360   Recent Labs  Lab 12/25/18 1819  12/27/18 0246 12/28/18 0300 12/29/18 0512 12/30/18 1111  NA 139   < >  142 143 144 143  K 4.1   < > 4.4 4.3 4.2 4.1  CL 107   < > 113* 113* 114* 110  CO2 21*   < > 22 22 22 22   BUN 27*   < > 26* 24* 21 19  CREATININE 1.63*   < > 1.35* 1.28* 1.14 1.13  CALCIUM 9.0   < > 8.6* 8.5* 8.3* 8.6*  PROT 7.2  --  6.0*  --   --   --   BILITOT 1.3*  --  1.1  --   --   --   ALKPHOS 54  --  46  --   --   --   ALT 36  --   40  --   --   --   AST 27  --  30  --   --   --   GLUCOSE 216*   < > 106* 112* 141* 131*   < > = values in this interval not displayed.      Imaging/Diagnostic Tests: Vas Lower Extremity Venous (dvt)  Result Date: 12/29/2018  Lower Venous Study Indications: Edema.  Risk Factors: None identified. Limitations: Bandages. Comparison Study: No prior studies. Performing Technologist: 13/01/2019 RVT  Examination Guidelines: A complete evaluation includes B-mode imaging, spectral Doppler, color Doppler, and power Doppler as needed of all accessible portions of each vessel. Bilateral testing is considered an integral part of a complete examination. Limited examinations for reoccurring indications may be performed as noted.  +---------+---------------+---------+-----------+----------+--------------+ RIGHT    CompressibilityPhasicitySpontaneityPropertiesThrombus Aging +---------+---------------+---------+-----------+----------+--------------+ CFV      Full           Yes      Yes                                 +---------+---------------+---------+-----------+----------+--------------+ SFJ      Full                                                        +---------+---------------+---------+-----------+----------+--------------+ FV Prox  Full                                                        +---------+---------------+---------+-----------+----------+--------------+ FV Mid   Full                                                        +---------+---------------+---------+-----------+----------+--------------+ FV DistalFull                                                        +---------+---------------+---------+-----------+----------+--------------+ PFV      Full                                                        +---------+---------------+---------+-----------+----------+--------------+  POP      Full           Yes      Yes                                  +---------+---------------+---------+-----------+----------+--------------+ PTV      Full                                                        +---------+---------------+---------+-----------+----------+--------------+ PERO     Full                                                        +---------+---------------+---------+-----------+----------+--------------+   +---------+---------------+---------+-----------+----------+--------------+ LEFT     CompressibilityPhasicitySpontaneityPropertiesThrombus Aging +---------+---------------+---------+-----------+----------+--------------+ CFV      Full           Yes      Yes                                 +---------+---------------+---------+-----------+----------+--------------+ SFJ      Full                                                        +---------+---------------+---------+-----------+----------+--------------+ FV Prox  Full                                                        +---------+---------------+---------+-----------+----------+--------------+ FV Mid   Full                                                        +---------+---------------+---------+-----------+----------+--------------+ FV DistalFull                                                        +---------+---------------+---------+-----------+----------+--------------+ PFV      Full                                                        +---------+---------------+---------+-----------+----------+--------------+ POP      Full           Yes      Yes                                 +---------+---------------+---------+-----------+----------+--------------+  PTV      Full                                                        +---------+---------------+---------+-----------+----------+--------------+ PERO     Full                                                         +---------+---------------+---------+-----------+----------+--------------+     Summary: Right: There is no evidence of deep vein thrombosis in the lower extremity. No cystic structure found in the popliteal fossa. Left: There is no evidence of deep vein thrombosis in the lower extremity. No cystic structure found in the popliteal fossa.  *See table(s) above for measurements and observations. Electronically signed by Servando Snare MD on 12/29/2018 at 3:29:54 PM.    Final      Kathrene Alu, MD 12/31/2018, 2:23 AM PGY-3, [place] Intern pager: [phone], text pages welcome

## 2018-12-31 NOTE — Progress Notes (Signed)
Physical Therapy Treatment Patient Details Name: Chad Hall MRN: [id number] DOB: 28-Jun-1950 Today's Date: 12/31/2018    History of Present Illness Pt is a 68 y.o. M admitted with a diagnosis of osteomyelitis of the L foot. Surgical intervention included a 3rd ray amputation. He has a PMH including but not limited to hypertension, Marfan syndrome, and diabetes mellitus type 2. He is legally blind.     PT Comments    Patient did well with the steps. He required cuing to not put weight on the foot but was able to balance with his heel. Therapy showed him how his family should guard him. He feels comfortable he will be able to do it at home. He only has 2 steps going in. The patient is making good progress.     Follow Up Recommendations  Home health PT     Equipment Recommendations  Rolling walker with 5" wheels;Wheelchair cushion (measurements PT);Wheelchair (measurements PT)    Recommendations for Other Services       Precautions / Restrictions Precautions Precautions: Fall Precaution Comments: NWB on LLE Required Braces or Orthoses: Other Brace Other Brace: post-op shoe Restrictions Weight Bearing Restrictions: Yes LLE Weight Bearing: Non weight bearing Other Position/Activity Restrictions: aable to put weight through his heel for stability     Mobility  Bed Mobility                  Transfers Overall transfer level: Needs assistance Equipment used: Rolling walker (2 wheeled) Transfers: Sit to/from [employer] Sit to Stand: Min guard Stand pivot transfers: Min guard       General transfer comment: cuing to not put weight on the Left foot.   Ambulation/Gait Ambulation/Gait assistance: Min guard Gait Distance (Feet): 6 Feet(3'x2 form the bed to the chair. ) Assistive device: Rolling walker (2 wheeled) Gait Pattern/deviations: Step-through pattern;Decreased step length - right;Decreased step length - left Gait velocity: decreased    General Gait Details: cuing for weight bearing    Stairs Stairs: Yes Stairs assistance: Min guard Stair Management: One rail Right Number of Stairs: 4 General stair comments: up and down 1 step. Patient can hold a pole at home. He will have people there to assist. Mod cuing for giat pattern.    Wheelchair Mobility    Modified Rankin (Stroke Patients Only)       Balance   Sitting-balance support: Feet supported;Feet unsupported;No upper extremity supported;Single extremity supported Sitting balance-Leahy Scale: Good Sitting balance - Comments: Pt able to sit EOB on his own   Standing balance support: Bilateral upper extremity supported;During functional activity Standing balance-Leahy Scale: Poor                              Cognition Arousal/Alertness: Awake/alert Behavior During Therapy: WFL for tasks assessed/performed Overall Cognitive Status: Within Functional Limits for tasks assessed                                        Exercises      General Comments        Pertinent Vitals/Pain Pain Assessment: No/denies pain Faces Pain Scale: No hurt Pain Location: L foot    Home Living                      Prior Function  PT Goals (current goals can now be found in the care plan section) Acute Rehab PT Goals Patient Stated Goal: to improve mobility PT Goal Formulation: With patient Time For Goal Achievement: 12/31/18 Potential to Achieve Goals: Good Progress towards PT goals: Progressing toward goals    Frequency    Min 3X/week      PT Plan Current plan remains appropriate    Co-evaluation              AM-PAC PT "6 Clicks" Mobility   Outcome Measure  Help needed turning from your back to your side while in a flat bed without using bedrails?: None Help needed moving from lying on your back to sitting on the side of a flat bed without using bedrails?: None Help needed moving to and from a bed  to a chair (including a wheelchair)?: A Little Help needed standing up from a chair using your arms (e.g., wheelchair or bedside chair)?: A Little Help needed to walk in hospital room?: A Little Help needed climbing 3-5 steps with a railing? : A Lot 6 Click Score: 19    End of Session Equipment Utilized During Treatment: Gait belt Activity Tolerance: Patient tolerated treatment well;No increased pain Patient left: in chair;with call bell/phone within reach;with chair alarm set Nurse Communication: Mobility status PT Visit Diagnosis: Unsteadiness on feet (R26.81);Other abnormalities of gait and mobility (R26.89);Muscle weakness (generalized) (M62.81)     Time: 9166-0600 PT Time Calculation (min) (ACUTE ONLY): 20 min  Charges:  $Gait Training: 8-22 mins                      Carney Living PT DPT  12/31/2018, 4:26 PM

## 2019-01-01 LAB — GLUCOSE, CAPILLARY: Glucose-Capillary: 173 mg/dL — ABNORMAL HIGH (ref 70–99)

## 2019-01-01 MED ORDER — ACETAMINOPHEN 325 MG PO TABS: 650.0000 mg | ORAL_TABLET | Freq: Four times a day (QID) | ORAL | 0 refills | Status: DC | PRN

## 2019-01-01 MED ORDER — ATORVASTATIN CALCIUM 80 MG PO TABS: 80.0000 mg | ORAL_TABLET | Freq: Every day | ORAL | 0 refills | Status: DC

## 2019-01-01 MED ORDER — OXYCODONE HCL 5 MG PO TABS: 5.0000 mg | ORAL_TABLET | Freq: Four times a day (QID) | ORAL | 0 refills | Status: DC | PRN

## 2019-01-01 MED ORDER — CEPHALEXIN 500 MG PO CAPS: 500.0000 mg | ORAL_CAPSULE | Freq: Three times a day (TID) | ORAL | 0 refills | Status: DC

## 2019-01-01 MED ORDER — DOCUSATE SODIUM 100 MG PO CAPS: 100.0000 mg | ORAL_CAPSULE | Freq: Every day | ORAL | 0 refills | Status: DC | PRN

## 2019-01-01 MED ORDER — ONDANSETRON HCL 4 MG PO TABS: 4.0000 mg | ORAL_TABLET | Freq: Three times a day (TID) | ORAL | 0 refills | Status: DC | PRN

## 2019-01-01 NOTE — Progress Notes (Signed)
AVS given and reviewed with pt. Medications discussed and Keflex delivered to bedside by pharmacy. All questions answered to satisfaction. Pt verbalized understanding of information given. Pt escorted off the unit with all belongings via wheelchair by staff member.

## 2019-01-01 NOTE — Plan of Care (Signed)

## 2019-01-09 ENCOUNTER — Ambulatory Visit (INDEPENDENT_AMBULATORY_CARE_PROVIDER_SITE_OTHER): Payer: Medicare Other | Admitting: Orthopedic Surgery

## 2019-01-09 ENCOUNTER — Other Ambulatory Visit: Payer: Self-pay

## 2019-01-09 ENCOUNTER — Encounter: Payer: Self-pay | Admitting: Orthopedic Surgery

## 2019-01-09 VITALS — Ht 77.01 in | Wt 230.0 lb

## 2019-01-09 DIAGNOSIS — I872 Venous insufficiency (chronic) (peripheral): Secondary | ICD-10-CM

## 2019-01-09 NOTE — Progress Notes (Signed)
Office Visit Note   Patient: Chad Hall           Date of Birth: [birth year]           MRN: [id number] Visit Date: 01/09/2019              Requested by: Arlyce Harman, DO [address] [place],  [place] [address] PCP: Arlyce Harman, DO  Chief Complaint  Patient presents with  . Left Foot - Routine Post Op    12/28/2018 left foot 3rd toe      HPI: This is a pleasant gentleman who is now 12 days status post left foot third ray amputation.  He is doing well and reports no pain.  Of note he is vision impaired  Assessment & Plan: Visit Diagnoses: No diagnosis found.  Plan: I did speak with him today that he may get this wet with soap and water but it is very important to dry his foot especially between his toes.  He also is complaining of some diarrhea and I wrote down for him to get probiotics as he has been on antibiotics.   Follow-Up Instructions: No follow-ups on file.   Ortho Exam  Patient is alert, oriented, no adenopathy, well-dressed, normal affect, normal respiratory effort. Wound edges are opposed. No surrounding cellulitis. Faint palpable pulse. There appears to be a lot of moisture especially between the toes with maceration of the skin  Imaging: No results found. No images are attached to the encounter Labs: Lab Results  Component Value Date   HGBA1C 6.4 (H) 12/26/2018   HGBA1C 6.5 09/26/2018   HGBA1C 6.9 04/08/2018   REPTSTATUS 12/29/2018 FINAL 12/26/2018   GRAMSTAIN  12/26/2018    RARE WBC PRESENT, PREDOMINANTLY PMN MODERATE GRAM POSITIVE COCCI IN PAIRS MODERATE GRAM NEGATIVE RODS Performed at [facility], [address], [place], [place] [address]    CULT  12/26/2018    MODERATE ESCHERICHIA COLI MODERATE ALCALIGENES FAECALIS FEW STREPTOCOCCUS MITIS/ORALIS    LABORGA ESCHERICHIA COLI 12/26/2018   LABORGA ALCALIGENES FAECALIS 12/26/2018   LABORGA STREPTOCOCCUS MITIS/ORALIS 12/26/2018     Lab Results  Component Value Date    ALBUMIN 2.2 (L) 12/27/2018   ALBUMIN 2.9 (L) 12/25/2018   ALBUMIN 4.3 01/06/2017    No results found for: MG No results found for: VD25OH  No results found for: PREALBUMIN CBC EXTENDED Latest Ref Rng & Units 12/31/2018 12/30/2018 12/29/2018  WBC 4.0 - 10.5 K/uL 11.7(H) 12.2(H) 10.6(H)  RBC 4.22 - 5.81 MIL/uL 3.26(L) 3.54(L) 3.26(L)  HGB 13.0 - 17.0 g/dL 3.8(H) 10.4(L) 9.5(L)  HCT 39.0 - 52.0 % 30.8(L) 33.9(L) 31.3(L)  PLT 150 - 400 K/uL 343 360 316  NEUTROABS 1.7 - 7.7 K/uL - - -  LYMPHSABS 0.7 - 4.0 K/uL - - -     Body mass index is 27.27 kg/m.  Orders:  No orders of the defined types were placed in this encounter.  No orders of the defined types were placed in this encounter.    Procedures: No procedures performed  Clinical Data: No additional findings.  ROS:  All other systems negative, except as noted in the HPI. Review of Systems  Objective: Vital Signs: Ht 6' 5.01" (1.956 m)   Wt 230 lb (104.3 kg)   BMI 27.27 kg/m   Specialty Comments:  No specialty comments available.  PMFS History: Patient Active Problem List   Diagnosis Date Noted  . Pyogenic inflammation of bone (HCC)   . Osteomyelitis of foot,  left, acute ([place]) 12/26/2018  . Right knee pain 10/04/2017  . Left hip pain 04/10/2013  . Diabetic peripheral neuropathy associated with type 2 diabetes mellitus ([place]) 11/10/2012  . Marfan's syndrome 08/08/2012  . Healthcare maintenance 08/08/2012  . Atopic dermatitis 02/16/2012  . Overweight (BMI 25.0-29.9) 07/05/2011  . Allergic rhinitis due to allergen 07/03/2011  . Venous insufficiency of leg 04/03/2011  . Blindness 07/23/2010  . DM (diabetes mellitus), type 2 with neurological complications ([place]) 09/38/1829  . Hypertension 06/13/2010  . Hyperlipidemia 06/13/2010   Past Medical History:  Diagnosis Date  . Allergy   . Blindness - both eyes 1967   Basketball injury  . Diabetes mellitus   . Hernia   . Hyperlipidemia   . Hypertension   .  Marfan syndrome     Family History  Problem Relation Age of Onset  . Heart disease Mother   . Breast cancer Sister     Past Surgical History:  Procedure Laterality Date  . AMPUTATION Left 12/28/2018   Procedure: LEFT FOOT 3RD RAY AMPUTATION;  Surgeon: Newt Minion, MD;  Location: [facility];  Service: Orthopedics;  Laterality: Left;  . APPENDECTOMY    . INGUINAL HERNIA REPAIR     Right 1970s,    Left 2011 by Dr Redmond Pulling  . INGUINAL HERNIA REPAIR  08/25/10   recurrent left  . [facility], Dr Janice Norrie  . RETINAL DETACHMENT SURGERY  1970  . Rotator Cuff Repair  2009   Left side, Dr Hal Morales   . ROTATOR CUFF REPAIR  2010-2011?   right side   Social History   Occupational History  . Occupation: Publishing copy: [employer]  Tobacco Use  . Smoking status: Former Smoker    Quit date: 02/14/1984    Years since quitting: 34.9  . Smokeless tobacco: Never Used  Substance and Sexual Activity  . Alcohol use: No    Comment: Quit in 10/1983  . Drug use: No  . Sexual activity: Not on file

## 2019-01-16 ENCOUNTER — Encounter: Payer: Medicare Other | Admitting: Cardiothoracic Surgery

## 2019-01-17 ENCOUNTER — Other Ambulatory Visit: Payer: Self-pay

## 2019-01-17 ENCOUNTER — Ambulatory Visit (INDEPENDENT_AMBULATORY_CARE_PROVIDER_SITE_OTHER): Payer: Medicare Other | Admitting: Orthopedic Surgery

## 2019-01-17 ENCOUNTER — Encounter: Payer: Self-pay | Admitting: Physician Assistant

## 2019-01-17 VITALS — Ht 77.01 in | Wt 230.0 lb

## 2019-01-17 DIAGNOSIS — Z89422 Acquired absence of other left toe(s): Secondary | ICD-10-CM

## 2019-01-17 NOTE — Progress Notes (Signed)
Office Visit Note   Patient: Chad Hall           Date of Birth: 1951/02/06           MRN: [id number] Visit Date: 01/17/2019              Requested by: Nuala Alpha, DO [address],  [place] [address] PCP: Nuala Alpha, DO  Chief Complaint  Patient presents with  . Left Foot - Routine Post Op    12/28/18 left foot 3rd ray      HPI: Patient is a 68 year old gentleman status post a left foot third ray amputation.  Patient is currently on Keflex 3 times a day for 3 weeks.  Patient is currently full weightbearing with dehiscence of the wound.  He states he washes the wound every other day.  Assessment & Plan: Visit Diagnoses:  1. Hx of amputation of lesser toe, left ([place])     Plan: Patient was recommended to wash the wound daily recommended minimal or no weightbearing weightbearing through the heel would be okay.  Follow-Up Instructions: Return in about 1 week (around 01/24/2019).   Ortho Exam  Patient is alert, oriented, no adenopathy, well-dressed, normal affect, normal respiratory effort. Examination patient has a wound that is dirty and fibrinous exudative tissue.  This was debrided there is some healthy granulation tissue at the base of the wound there is no purulence no abscess no signs of infection.  The sutures are in place.  Patient currently uses a cane for ambulation.  Imaging: No results found. No images are attached to the encounter.  Labs: Lab Results  Component Value Date   HGBA1C 6.4 (H) 12/26/2018   HGBA1C 6.5 09/26/2018   HGBA1C 6.9 04/08/2018   REPTSTATUS 12/29/2018 FINAL 12/26/2018   GRAMSTAIN  12/26/2018    RARE WBC PRESENT, PREDOMINANTLY PMN MODERATE GRAM POSITIVE COCCI IN PAIRS MODERATE GRAM NEGATIVE RODS Performed at [facility], [place] [address], [place], [place] [address]    CULT  12/26/2018    MODERATE ESCHERICHIA COLI MODERATE ALCALIGENES FAECALIS FEW STREPTOCOCCUS MITIS/ORALIS    LABORGA ESCHERICHIA  COLI 12/26/2018   LABORGA ALCALIGENES FAECALIS 12/26/2018   LABORGA STREPTOCOCCUS MITIS/ORALIS 12/26/2018     Lab Results  Component Value Date   ALBUMIN 2.2 (L) 12/27/2018   ALBUMIN 2.9 (L) 12/25/2018   ALBUMIN 4.3 01/06/2017    No results found for: MG No results found for: VD25OH  No results found for: PREALBUMIN CBC EXTENDED Latest Ref Rng & Units 12/31/2018 12/30/2018 12/29/2018  WBC 4.0 - 10.5 K/uL 11.7(H) 12.2(H) 10.6(H)  RBC 4.22 - 5.81 MIL/uL 3.26(L) 3.54(L) 3.26(L)  HGB 13.0 - 17.0 g/dL 9.7(L) 10.4(L) 9.5(L)  HCT 39.0 - 52.0 % 30.8(L) 33.9(L) 31.3(L)  PLT 150 - 400 K/uL 343 360 316  NEUTROABS 1.7 - 7.7 K/uL - - -  LYMPHSABS 0.7 - 4.0 K/uL - - -     Body mass index is 27.27 kg/m.  Orders:  No orders of the defined types were placed in this encounter.  No orders of the defined types were placed in this encounter.    Procedures: No procedures performed  Clinical Data: No additional findings.  ROS:  All other systems negative, except as noted in the HPI. Review of Systems  Objective: Vital Signs: Ht 6' 5.01" (1.956 m)   Wt 230 lb (104.3 kg)   BMI 27.27 kg/m   Specialty Comments:  No specialty comments available.  PMFS History: Patient Active  Problem List   Diagnosis Date Noted  . Pyogenic inflammation of bone (HCC)   . Osteomyelitis of foot, left, acute (HCC) 12/26/2018  . Right knee pain 10/04/2017  . Left hip pain 04/10/2013  . Diabetic peripheral neuropathy associated with type 2 diabetes mellitus (HCC) 11/10/2012  . Marfan's syndrome 08/08/2012  . Healthcare maintenance 08/08/2012  . Atopic dermatitis 02/16/2012  . Overweight (BMI 25.0-29.9) 07/05/2011  . Allergic rhinitis due to allergen 07/03/2011  . Venous insufficiency of leg 04/03/2011  . Blindness 07/23/2010  . DM (diabetes mellitus), type 2 with neurological complications (HCC) 06/13/2010  . Hypertension 06/13/2010  . Hyperlipidemia 06/13/2010   Past Medical History:   Diagnosis Date  . Allergy   . Blindness - both eyes 1967   Basketball injury  . Diabetes mellitus   . Hernia   . Hyperlipidemia   . Hypertension   . Marfan syndrome     Family History  Problem Relation Age of Onset  . Heart disease Mother   . Breast cancer Sister     Past Surgical History:  Procedure Laterality Date  . AMPUTATION Left 12/28/2018   Procedure: LEFT FOOT 3RD RAY AMPUTATION;  Surgeon: Nadara Mustard, MD;  Location: [facility];  Service: Orthopedics;  Laterality: Left;  . APPENDECTOMY    . INGUINAL HERNIA REPAIR     Right 1970s,    Left 2011 by Dr Andrey Campanile  . INGUINAL HERNIA REPAIR  08/25/10   recurrent left  . PENILE PROSTHESIS IMPLANT  2000s   [facility], Dr Brunilda Payor  . RETINAL DETACHMENT SURGERY  1970  . Rotator Cuff Repair  2009   Left side, Dr Elita Quick   . ROTATOR CUFF REPAIR  2010-2011?   right side   Social History   Occupational History  . 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 Smoking status: Former Smoker    Quit date: 02/14/1984    Years since quitting: 34.9  . Smokeless tobacco: Never Used  Substance and Sexual Activity  . Alcohol use: No    Comment: Quit in 10/1983  . Drug use: No  . Sexual activity: Not on file

## 2019-01-20 ENCOUNTER — Encounter: Payer: Medicare Other | Admitting: Cardiothoracic Surgery

## 2019-01-20 ENCOUNTER — Other Ambulatory Visit: Payer: Self-pay | Admitting: Family Medicine

## 2019-01-23 ENCOUNTER — Encounter: Payer: Medicare Other | Admitting: Cardiothoracic Surgery

## 2019-01-24 ENCOUNTER — Encounter: Payer: Self-pay | Admitting: Physician Assistant

## 2019-01-24 ENCOUNTER — Ambulatory Visit (INDEPENDENT_AMBULATORY_CARE_PROVIDER_SITE_OTHER): Payer: Medicare Other | Admitting: Physician Assistant

## 2019-01-24 ENCOUNTER — Other Ambulatory Visit: Payer: Self-pay

## 2019-01-24 VITALS — Ht 77.01 in | Wt 230.0 lb

## 2019-01-24 DIAGNOSIS — Z89422 Acquired absence of other left toe(s): Secondary | ICD-10-CM

## 2019-01-24 MED ORDER — CEPHALEXIN 500 MG PO CAPS: ORAL_CAPSULE | ORAL | 0 refills | Status: DC

## 2019-01-24 NOTE — Progress Notes (Signed)
Office Visit Note   Patient: Chad Hall           Date of Birth: 1951/02/08           MRN: [id number] Visit Date: 01/24/2019              Requested by: Nuala Alpha, DO [address],  [place] [address] PCP: Nuala Alpha, DO  Chief Complaint  Patient presents with  . Left Foot - Routine Post Op    12/28/18 left foot 3rd ray      HPI: Pleasant gentleman is 4 weeks s/p 3rd ray amputation left foot. He is vision impaired but has been doing daily dressing changes and reports no odor and only drainage with weightbearing. He is on keflex 300 TID  Assessment & Plan: Visit Diagnoses: No diagnosis found.  Plan: Patient was seen today by Dr. Sharol Given. His most predictable option would be a BKA. He could also try a transmetatarsal  Amputation but this would be more unpredictable  And require a longer period of Non weightbearing. The patient would like to continue for another week with washing his foot and dry dressing changes. He is to limit his weightbearing as much as possible. Keflex was renewed    Follow-Up Instructions: No follow-ups on file.   Ortho Exam  Patient is alert, oriented, no adenopathy, well-dressed, normal affect, normal respiratory effort. Left Foot: strong DP and PT biphasic pulses. Wound has dehisced  And probes to bone . Non viable tissue with very little  Healing. Heel also decreased turgor  Imaging: No results found. No images are attached to the encounter.  Labs: Lab Results  Component Value Date   HGBA1C 6.4 (H) 12/26/2018   HGBA1C 6.5 09/26/2018   HGBA1C 6.9 04/08/2018   REPTSTATUS 12/29/2018 FINAL 12/26/2018   GRAMSTAIN  12/26/2018    RARE WBC PRESENT, PREDOMINANTLY PMN MODERATE GRAM POSITIVE COCCI IN PAIRS MODERATE GRAM NEGATIVE RODS Performed at [facility], [place] [address], [place], [place] [address]    CULT  12/26/2018    MODERATE ESCHERICHIA COLI MODERATE ALCALIGENES FAECALIS FEW STREPTOCOCCUS MITIS/ORALIS    LABORGA ESCHERICHIA COLI 12/26/2018   LABORGA ALCALIGENES FAECALIS 12/26/2018   LABORGA STREPTOCOCCUS MITIS/ORALIS 12/26/2018     Lab Results  Component Value Date   ALBUMIN 2.2 (L) 12/27/2018   ALBUMIN 2.9 (L) 12/25/2018   ALBUMIN 4.3 01/06/2017    No results found for: MG No results found for: VD25OH  No results found for: PREALBUMIN CBC EXTENDED Latest Ref Rng & Units 12/31/2018 12/30/2018 12/29/2018  WBC 4.0 - 10.5 K/uL 11.7(H) 12.2(H) 10.6(H)  RBC 4.22 - 5.81 MIL/uL 3.26(L) 3.54(L) 3.26(L)  HGB 13.0 - 17.0 g/dL 9.7(L) 10.4(L) 9.5(L)  HCT 39.0 - 52.0 % 30.8(L) 33.9(L) 31.3(L)  PLT 150 - 400 K/uL 343 360 316  NEUTROABS 1.7 - 7.7 K/uL - - -  LYMPHSABS 0.7 - 4.0 K/uL - - -     Body mass index is 27.27 kg/m.  Orders:  No orders of the defined types were placed in this encounter.  Meds ordered this encounter  Medications  . cephALEXin (KEFLEX) 500 MG capsule    Sig: TAKE 1 CAPSULE BY MOUTH 3  TIMES DAILY FOR 16 DAYS    Dispense:  48 capsule    Refill:  0     Procedures: No procedures performed  Clinical Data: No additional findings.  ROS:  All other systems negative, except as noted in the HPI. Review of Systems  Objective: Vital Signs: Ht 6' 5.01" (1.956 m)   Wt 230 lb (104.3 kg)   BMI 27.27 kg/m   Specialty Comments:  No specialty comments available.  PMFS History: Patient Active Problem List   Diagnosis Date Noted  . Pyogenic inflammation of bone (HCC)   . Osteomyelitis of foot, left, acute (HCC) 12/26/2018  . Right knee pain 10/04/2017  . Left hip pain 04/10/2013  . Diabetic peripheral neuropathy associated with type 2 diabetes mellitus (HCC) 11/10/2012  . Marfan's syndrome 08/08/2012  . Healthcare maintenance 08/08/2012  . Atopic dermatitis 02/16/2012  . Overweight (BMI 25.0-29.9) 07/05/2011  . Allergic rhinitis due to allergen 07/03/2011  . Venous insufficiency of leg 04/03/2011  . Blindness 07/23/2010  . DM (diabetes mellitus), type 2  with neurological complications (HCC) 06/13/2010  . Hypertension 06/13/2010  . Hyperlipidemia 06/13/2010   Past Medical History:  Diagnosis Date  . Allergy   . Blindness - both eyes 1967   Basketball injury  . Diabetes mellitus   . Hernia   . Hyperlipidemia   . Hypertension   . Marfan syndrome     Family History  Problem Relation Age of Onset  . Heart disease Mother   . Breast cancer Sister     Past Surgical History:  Procedure Laterality Date  . AMPUTATION Left 12/28/2018   Procedure: LEFT FOOT 3RD RAY AMPUTATION;  Surgeon: Nadara Mustard, MD;  Location: [facility] OR;  Service: Orthopedics;  Laterality: Left;  . APPENDECTOMY    . INGUINAL HERNIA REPAIR     Right 1970s,    Left 2011 by Dr Andrey Campanile  . INGUINAL HERNIA REPAIR  08/25/10   recurrent left  . PENILE PROSTHESIS IMPLANT  2000s   [facility], Dr Brunilda Payor  . RETINAL DETACHMENT SURGERY  1970  . Rotator Cuff Repair  2009   Left side, Dr Elita Quick   . ROTATOR CUFF REPAIR  2010-2011?   right side   Social History   Occupational History  . 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 Smoking status: Former Smoker    Quit date: 02/14/1984    Years since quitting: 34.9  . Smokeless tobacco: Never Used  Substance and Sexual Activity  . Alcohol use: No    Comment: Quit in 10/1983  . Drug use: No  . Sexual activity: Not on file

## 2019-01-30 ENCOUNTER — Other Ambulatory Visit: Payer: Self-pay

## 2019-01-30 ENCOUNTER — Telehealth (INDEPENDENT_AMBULATORY_CARE_PROVIDER_SITE_OTHER): Payer: Medicare Other | Admitting: Cardiothoracic Surgery

## 2019-01-30 DIAGNOSIS — I712 Thoracic aortic aneurysm, without rupture: Secondary | ICD-10-CM

## 2019-01-31 ENCOUNTER — Encounter: Payer: Self-pay | Admitting: Orthopedic Surgery

## 2019-01-31 ENCOUNTER — Other Ambulatory Visit: Payer: Self-pay

## 2019-01-31 ENCOUNTER — Ambulatory Visit (INDEPENDENT_AMBULATORY_CARE_PROVIDER_SITE_OTHER): Payer: Medicare Other | Admitting: Orthopedic Surgery

## 2019-01-31 VITALS — Ht 77.01 in | Wt 230.0 lb

## 2019-01-31 DIAGNOSIS — Z89422 Acquired absence of other left toe(s): Secondary | ICD-10-CM

## 2019-01-31 DIAGNOSIS — I872 Venous insufficiency (chronic) (peripheral): Secondary | ICD-10-CM

## 2019-01-31 NOTE — Progress Notes (Signed)
Office Visit Note   Patient: Chad Hall           Date of Birth: [birth year]           MRN: [id number] Visit Date: 01/31/2019              Requested by: Arlyce Harman, DO [address] [place],  [place] [address] PCP: Arlyce Harman, DO  Chief Complaint  Patient presents with  . Left Foot - Routine Post Op    12/28/18 left foot 3rd ray      HPI: Pleasant gentleman is one month s/p 3rd ray amputation left foot. He is vision impaired but has been doing daily dressing changes independently.  He reports no odor and only drainage with weightbearing. He is on keflex 300 TID.  Assessment & Plan: Visit Diagnoses:  1. Hx of amputation of lesser toe, left (HCC)   2. Venous insufficiency of left lower extremity     Plan: Discussed that due to incisional dehiscence and poor circulation, will likely require further limb salvage surgery. Patient would like to exhaust conservative measures at this time. He is to limit his weightbearing as much as possible. Continue keflex.  Follow-Up Instructions: Return in about 13 days (around 02/13/2019).   Ortho Exam  Patient is alert, oriented, no adenopathy, well-dressed, normal affect, normal respiratory effort. incision has dehisced. Sutures in place. No visible bone today. 100% fibrinous exudative tissue in wound bed. No active drainage. dopplerable biphasic dp pulse.  Imaging: No results found. No images are attached to the encounter.  Labs: Lab Results  Component Value Date   HGBA1C 6.4 (H) 12/26/2018   HGBA1C 6.5 09/26/2018   HGBA1C 6.9 04/08/2018   REPTSTATUS 12/29/2018 FINAL 12/26/2018   GRAMSTAIN  12/26/2018    RARE WBC PRESENT, PREDOMINANTLY PMN MODERATE GRAM POSITIVE COCCI IN PAIRS MODERATE GRAM NEGATIVE RODS Performed at [facility], [address] [place], [place] [address]    CULT  12/26/2018    MODERATE ESCHERICHIA COLI MODERATE ALCALIGENES FAECALIS FEW STREPTOCOCCUS MITIS/ORALIS    LABORGA  ESCHERICHIA COLI 12/26/2018   LABORGA ALCALIGENES FAECALIS 12/26/2018   LABORGA STREPTOCOCCUS MITIS/ORALIS 12/26/2018     Lab Results  Component Value Date   ALBUMIN 2.2 (L) 12/27/2018   ALBUMIN 2.9 (L) 12/25/2018   ALBUMIN 4.3 01/06/2017    No results found for: MG No results found for: VD25OH  No results found for: PREALBUMIN CBC EXTENDED Latest Ref Rng & Units 12/31/2018 12/30/2018 12/29/2018  WBC 4.0 - 10.5 K/uL 11.7(H) 12.2(H) 10.6(H)  RBC 4.22 - 5.81 MIL/uL 3.26(L) 3.54(L) 3.26(L)  HGB 13.0 - 17.0 g/dL 1.6(W) 10.4(L) 9.5(L)  HCT 39.0 - 52.0 % 30.8(L) 33.9(L) 31.3(L)  PLT 150 - 400 K/uL 343 360 316  NEUTROABS 1.7 - 7.7 K/uL - - -  LYMPHSABS 0.7 - 4.0 K/uL - - -     Body mass index is 27.27 kg/m.  Orders:  No orders of the defined types were placed in this encounter.  No orders of the defined types were placed in this encounter.    Procedures: No procedures performed  Clinical Data: No additional findings.  ROS:  All other systems negative, except as noted in the HPI. Review of Systems  Constitutional: Negative for chills and fever.  Skin: Positive for wound. Negative for color change.    Objective: Vital Signs: Ht 6' 5.01" (1.956 m)   Wt 230 lb (104.3 kg)   BMI 27.27 kg/m   Specialty Comments:  No specialty comments available.  PMFS History: Patient Active Problem List   Diagnosis Date Noted  . Pyogenic inflammation of bone ([place])   . Osteomyelitis of foot, left, acute ([place]) 12/26/2018  . Right knee pain 10/04/2017  . Left hip pain 04/10/2013  . Diabetic peripheral neuropathy associated with type 2 diabetes mellitus ([place]) 11/10/2012  . Marfan's syndrome 08/08/2012  . Healthcare maintenance 08/08/2012  . Atopic dermatitis 02/16/2012  . Overweight (BMI 25.0-29.9) 07/05/2011  . Allergic rhinitis due to allergen 07/03/2011  . Venous insufficiency of leg 04/03/2011  . Blindness 07/23/2010  . DM (diabetes mellitus), type 2 with neurological  complications ([place]) 02/72/5366  . Hypertension 06/13/2010  . Hyperlipidemia 06/13/2010   Past Medical History:  Diagnosis Date  . Allergy   . Blindness - both eyes 1967   Basketball injury  . Diabetes mellitus   . Hernia   . Hyperlipidemia   . Hypertension   . Marfan syndrome     Family History  Problem Relation Age of Onset  . Heart disease Mother   . Breast cancer Sister     Past Surgical History:  Procedure Laterality Date  . AMPUTATION Left 12/28/2018   Procedure: LEFT FOOT 3RD RAY AMPUTATION;  Surgeon: Newt Minion, MD;  Location: [facility];  Service: Orthopedics;  Laterality: Left;  . APPENDECTOMY    . INGUINAL HERNIA REPAIR     Right 1970s,    Left 2011 by Dr Redmond Pulling  . INGUINAL HERNIA REPAIR  08/25/10   recurrent left  . [facility], Dr Janice Norrie  . RETINAL DETACHMENT SURGERY  1970  . Rotator Cuff Repair  2009   Left side, Dr Hal Morales   . ROTATOR CUFF REPAIR  2010-2011?   right side   Social History   Occupational History  . Occupation: Publishing copy: [employer]  Tobacco Use  . Smoking status: Former Smoker    Quit date: 02/14/1984    Years since quitting: 34.9  . Smokeless tobacco: Never Used  Substance and Sexual Activity  . Alcohol use: No    Comment: Quit in 10/1983  . Drug use: No  . Sexual activity: Not on file

## 2019-02-03 NOTE — Progress Notes (Signed)
[address]       [place] [address]             [phone]     CARDIOTHORACIC SURGERY TELEPHONE VIRTUAL OFFICE NOTE  Referring Provider is Dollene Cleveland, DO Primary Cardiologist is No primary care provider on file. PCP is Arlyce Harman, DO   HPI:  I spoke with HALEEM HANNER (DOB [birth year] ) via telephone on 01/30/19 at 0930 hours and verified that I was speaking with the correct person using more than one form of identification.  We discussed the reason(s) for conducting our visit virtually instead of in-person.  The patient expressed understanding the circumstances and agreed to proceed as described.   68 yo man was recently admitted for foot infection associated with diabetes. During his stay, he underwent chest ct which showed aortic aneurysm. He has been told that he has marfan's syndrome due to his tall, lanky build, and other features of connective tissue disorders including blindness and multiple abdominal herniae. He denies shortness of breath or chest pain. He denies knowledge of family history of aneurysmal disease.    Current Outpatient Medications  Medication Sig Dispense Refill  . acetaminophen (TYLENOL) 325 MG tablet Take 2 tablets (650 mg total) by mouth every 6 (six) hours as needed for mild pain (or Fever >/= 101). 30 tablet 0  . atorvastatin (LIPITOR) 80 MG tablet TAKE 1 TABLET BY MOUTH  DAILY AT 6 PM 30 tablet 11  . cephALEXin (KEFLEX) 500 MG capsule TAKE 1 CAPSULE BY MOUTH 3  TIMES DAILY FOR 16 DAYS 48 capsule 0  . cholecalciferol (VITAMIN D3) 25 MCG (1000 UT) tablet Take 1,000 Units by mouth at bedtime.    . docusate sodium (COLACE) 100 MG capsule Take 1 capsule (100 mg total) by mouth daily as needed for mild constipation. 10 capsule 0  . enalapril (VASOTEC) 20 MG tablet TAKE 1 TABLET BY MOUTH  DAILY (Patient taking differently: Take 20 mg by mouth at bedtime. ) 90 tablet 3  . glipiZIDE (GLUCOTROL) 10 MG tablet TAKE 1 TABLET  BY MOUTH ONCE A DAY BEFORE NIGHT TIME MEAL (Patient taking differently: Take 10 mg by mouth at bedtime. ) 180 tablet 3  . glucose blood (PRODIGY NO CODING BLOOD GLUC) test strip USE TO CHECK BLOOD SUGAR  THREE TIMES DAILY OR AS  DIRECTED 300 strip 3  . JARDIANCE 10 MG TABS tablet TAKE 1 TABLET BY MOUTH  DAILY (Patient taking differently: Take 10 mg by mouth daily. ) 90 tablet 3  . metoprolol succinate (TOPROL-XL) 25 MG 24 hr tablet TAKE ONE-HALF TABLET BY  MOUTH DAILY (Patient taking differently: Take 12.5 mg by mouth every evening. ) 45 tablet 3  . ondansetron (ZOFRAN) 4 MG tablet Take 1 tablet (4 mg total) by mouth every 8 (eight) hours as needed for nausea or vomiting. 5 tablet 0  . oxyCODONE (OXY IR/ROXICODONE) 5 MG immediate release tablet Take 1 tablet (5 mg total) by mouth every 6 (six) hours as needed for severe pain (pain score 6-10). 5 tablet 0  . Prodigy Twist Top Lancets 28G MISC CHECK BLOOD SUGAR 3 TIMES  DAILY 300 each 3  . TRADJENTA 5 MG TABS tablet TAKE 1 TABLET BY MOUTH  DAILY (Patient taking differently: Take 5 mg by mouth at bedtime. ) 90 tablet 3   No current facility-administered medications for this visit.     Diagnostic Tests:  I have reviewed available imaging from recent  hospitalization including echocardiogram and ct chest. I agree with the interpretations.    Impression:  Pleasant 68 yo man with 6 cm aortic root aneurysm but no aortic valve insufficiency   Plan:  Will obtain repeat imaging studies in 3 months and schedule f/u visit by phone or in person at that time.  Refer to Dr. Delene Loll to determine if there is a definable genetic association with his aortic root aneurysm. T    I discussed limitations of evaluation and management via telephone.  The patient was advised to call back for repeat telephone consultation or to seek an in-person evaluation if questions arise or the patient's clinical condition changes in any significant manner.  I spent in  excess of 20 minutes of non-face-to-face time during the conduct of this telephone virtual office consultation.  Level 1  (99441)             5-10 minutes Level 2  (99442)            11-20 minutes Level 3  (99443)            21-30 minutes   B. Murvin Natal, MD 02/03/2019 6:20 AM

## 2019-02-12 ENCOUNTER — Other Ambulatory Visit: Payer: Self-pay | Admitting: Family Medicine

## 2019-02-12 ENCOUNTER — Other Ambulatory Visit: Payer: Self-pay | Admitting: Physician Assistant

## 2019-02-16 ENCOUNTER — Encounter: Payer: Self-pay | Admitting: Orthopedic Surgery

## 2019-02-16 ENCOUNTER — Ambulatory Visit (INDEPENDENT_AMBULATORY_CARE_PROVIDER_SITE_OTHER): Payer: Medicare Other | Admitting: Orthopedic Surgery

## 2019-02-16 ENCOUNTER — Other Ambulatory Visit: Payer: Self-pay

## 2019-02-16 VITALS — Ht 77.01 in | Wt 230.0 lb

## 2019-02-16 DIAGNOSIS — Z89422 Acquired absence of other left toe(s): Secondary | ICD-10-CM

## 2019-02-16 MED ORDER — NITROGLYCERIN 0.2 MG/HR TD PT24: 0.2000 mg | MEDICATED_PATCH | Freq: Every day | TRANSDERMAL | 12 refills | Status: DC

## 2019-02-16 MED ORDER — PENTOXIFYLLINE ER 400 MG PO TBCR: 400.0000 mg | EXTENDED_RELEASE_TABLET | Freq: Three times a day (TID) | ORAL | 0 refills | Status: DC

## 2019-02-16 NOTE — Progress Notes (Addendum)
Office Visit Note   Patient: Chad Hall           Date of Birth: 11-15-1950           MRN: [id number] Visit Date: 02/16/2019              Requested by: Arlyce Harman, DO [address] [place],  [place] [address] PCP: Arlyce Harman, DO  Chief Complaint  Patient presents with  . Left Foot - Routine Post Op    12/28/18 left foot 3rd ray      HPI: This is a pleasant gentleman who is now 6 weeks status post left third ray amputation he has had poor and slow wound healing.  He denies any foul odor or any increased pain  Assessment & Plan: Visit Diagnoses: No diagnosis found.  Plan: I am concerned that he has not yet healed this.  He would like to avoid surgery if at all possible.  I have recommended a week of Trental and nitroglycerin patches.  He should follow-up in 1 week for reexamination  Follow-Up Instructions: No follow-ups on file.   Ortho Exam  Patient is alert, oriented, no adenopathy, well-dressed, normal affect, normal respiratory effort. Left wound both dorsally and plantarly has dehisced sutures are no longer functioning there is gray nonvascular tissue.  There is no foul odor or purulent drainage.  He does have a biphasic pulse by Doppler no surrounding cellulitis.  When wound was being dressed it was noted that some dead skin was removed and beneath was a 1/2 cm of skin breakdown with fascial tissue protruding. There was no purulent drainage or foul odor  Imaging: No results found. No images are attached to the encounter.  Labs: Lab Results  Component Value Date   HGBA1C 6.4 (H) 12/26/2018   HGBA1C 6.5 09/26/2018   HGBA1C 6.9 04/08/2018   REPTSTATUS 12/29/2018 FINAL 12/26/2018   GRAMSTAIN  12/26/2018    RARE WBC PRESENT, PREDOMINANTLY PMN MODERATE GRAM POSITIVE COCCI IN PAIRS MODERATE GRAM NEGATIVE RODS Performed at [facility], [address] [place], [place] [address]    CULT  12/26/2018    MODERATE ESCHERICHIA COLI MODERATE  ALCALIGENES FAECALIS FEW STREPTOCOCCUS MITIS/ORALIS    LABORGA ESCHERICHIA COLI 12/26/2018   LABORGA ALCALIGENES FAECALIS 12/26/2018   LABORGA STREPTOCOCCUS MITIS/ORALIS 12/26/2018     Lab Results  Component Value Date   ALBUMIN 2.2 (L) 12/27/2018   ALBUMIN 2.9 (L) 12/25/2018   ALBUMIN 4.3 01/06/2017    No results found for: MG No results found for: VD25OH  No results found for: PREALBUMIN CBC EXTENDED Latest Ref Rng & Units 12/31/2018 12/30/2018 12/29/2018  WBC 4.0 - 10.5 K/uL 11.7(H) 12.2(H) 10.6(H)  RBC 4.22 - 5.81 MIL/uL 3.26(L) 3.54(L) 3.26(L)  HGB 13.0 - 17.0 g/dL 7.0(W) 10.4(L) 9.5(L)  HCT 39.0 - 52.0 % 30.8(L) 33.9(L) 31.3(L)  PLT 150 - 400 K/uL 343 360 316  NEUTROABS 1.7 - 7.7 K/uL - - -  LYMPHSABS 0.7 - 4.0 K/uL - - -     Body mass index is 27.27 kg/m.  Orders:  No orders of the defined types were placed in this encounter.  Meds ordered this encounter  Medications  . nitroGLYCERIN (NITRODUR - DOSED IN MG/24 HR) 0.2 mg/hr patch    Sig: Place 1 patch (0.2 mg total) onto the skin daily. Apply to skin daily to different area of ankle and foot. Remove previous patch    Dispense:  30 patch    Refill:  12  . pentoxifylline (TRENTAL) 400 MG CR tablet    Sig: Take 1 tablet (400 mg total) by mouth 3 (three) times daily with meals.    Dispense:  60 tablet    Refill:  0     Procedures: No procedures performed  Clinical Data: No additional findings.  ROS:  All other systems negative, except as noted in the HPI. Review of Systems  Objective: Vital Signs: Ht 6' 5.01" (1.956 m)   Wt 230 lb (104.3 kg)   BMI 27.27 kg/m   Specialty Comments:  No specialty comments available.  PMFS History: Patient Active Problem List   Diagnosis Date Noted  . Pyogenic inflammation of bone ([facility])   . Osteomyelitis of foot, left, acute ([facility]) 12/26/2018  . Right knee pain 10/04/2017  . Left hip pain 04/10/2013  . Diabetic peripheral neuropathy associated with type 2  diabetes mellitus ([facility]) 11/10/2012  . Marfan's syndrome 08/08/2012  . Healthcare maintenance 08/08/2012  . Atopic dermatitis 02/16/2012  . Overweight (BMI 25.0-29.9) 07/05/2011  . Allergic rhinitis due to allergen 07/03/2011  . Venous insufficiency of leg 04/03/2011  . Blindness 07/23/2010  . DM (diabetes mellitus), type 2 with neurological complications ([facility]) 07/37/1062  . Hypertension 06/13/2010  . Hyperlipidemia 06/13/2010   Past Medical History:  Diagnosis Date  . Allergy   . Blindness - both eyes 1967   Basketball injury  . Diabetes mellitus   . Hernia   . Hyperlipidemia   . Hypertension   . Marfan syndrome     Family History  Problem Relation Age of Onset  . Heart disease Mother   . Breast cancer Sister     Past Surgical History:  Procedure Laterality Date  . AMPUTATION Left 12/28/2018   Procedure: LEFT FOOT 3RD RAY AMPUTATION;  Surgeon: Newt Minion, MD;  Location: [facility];  Service: Orthopedics;  Laterality: Left;  . APPENDECTOMY    . INGUINAL HERNIA REPAIR     Right 1970s,    Left 2011 by Dr Redmond Pulling  . INGUINAL HERNIA REPAIR  08/25/10   recurrent left  . [facility], Dr Janice Norrie  . RETINAL DETACHMENT SURGERY  1970  . Rotator Cuff Repair  2009   Left side, Dr Hal Morales   . ROTATOR CUFF REPAIR  2010-2011?   right side   Social History   Occupational History  . Occupation: Publishing copy: [employer]  Tobacco Use  . Smoking status: Former Smoker    Quit date: 02/14/1984    Years since quitting: 35.0  . Smokeless tobacco: Never Used  Substance and Sexual Activity  . Alcohol use: No    Comment: Quit in 10/1983  . Drug use: No  . Sexual activity: Not on file

## 2019-02-20 ENCOUNTER — Other Ambulatory Visit: Payer: Self-pay | Admitting: Physician Assistant

## 2019-02-23 ENCOUNTER — Encounter: Payer: Self-pay | Admitting: Physician Assistant

## 2019-02-23 ENCOUNTER — Ambulatory Visit (INDEPENDENT_AMBULATORY_CARE_PROVIDER_SITE_OTHER): Payer: Medicare Other | Admitting: Physician Assistant

## 2019-02-23 ENCOUNTER — Ambulatory Visit: Payer: Medicare Other | Admitting: Physician Assistant

## 2019-02-23 ENCOUNTER — Other Ambulatory Visit: Payer: Self-pay

## 2019-02-23 DIAGNOSIS — M86172 Other acute osteomyelitis, left ankle and foot: Secondary | ICD-10-CM

## 2019-02-23 MED ORDER — CEPHALEXIN 500 MG PO CAPS: ORAL_CAPSULE | ORAL | 0 refills | Status: DC

## 2019-02-23 MED ORDER — NITROGLYCERIN 0.2 MG/HR TD PT24: 0.2000 mg | MEDICATED_PATCH | Freq: Every day | TRANSDERMAL | 12 refills | Status: DC

## 2019-02-23 MED ORDER — PENTOXIFYLLINE ER 400 MG PO TBCR: 400.0000 mg | EXTENDED_RELEASE_TABLET | Freq: Three times a day (TID) | ORAL | 0 refills | Status: DC

## 2019-02-23 NOTE — Progress Notes (Signed)
Office Visit Note   Patient: Chad Hall           Date of Birth: 06/14/1950           MRN: [id number] Visit Date: 02/23/2019              Requested by: Nuala Alpha, DO [address],  [place] [address] PCP: Nuala Alpha, DO  Chief Complaint  Patient presents with  . Left Foot - Follow-up      HPI: This is a pleasant gentleman who is 7 weeks status post left foot third ray amputation he has had difficulties healing but he would like to avoid another amputation.  A week ago he was started on antibiotics as well as Trental and nitroglycerin patches.  He has been changing the dressings he denies any foul odor or increased drainage  Assessment & Plan: Visit Diagnoses: No diagnosis found.  Plan: He will follow up in 2 weeks.  He will continue with the antibiotics nitroglycerin and Trental.  He understands if he is noticing more drainage and a foul odor he is to follow-up immediately again he would like to avoid an amputation  Follow-Up Instructions: No follow-ups on file.   Ortho Exam  Patient is alert, oriented, no adenopathy, well-dressed, normal affect, normal respiratory effort. Focused examination of his foot demonstrates minimal soft tissue swelling amputation site no foul odor no surrounding cellulitis he does have gray and fibrinous tissue.  No purulent drainage  Imaging: No results found. No images are attached to the encounter.  Labs: Lab Results  Component Value Date   HGBA1C 6.4 (H) 12/26/2018   HGBA1C 6.5 09/26/2018   HGBA1C 6.9 04/08/2018   REPTSTATUS 12/29/2018 FINAL 12/26/2018   GRAMSTAIN  12/26/2018    RARE WBC PRESENT, PREDOMINANTLY PMN MODERATE GRAM POSITIVE COCCI IN PAIRS MODERATE GRAM NEGATIVE RODS Performed at [facility], [place] [address], [place], [place] [address]    CULT  12/26/2018    MODERATE ESCHERICHIA COLI MODERATE ALCALIGENES FAECALIS FEW STREPTOCOCCUS MITIS/ORALIS    LABORGA ESCHERICHIA COLI 12/26/2018    LABORGA ALCALIGENES FAECALIS 12/26/2018   LABORGA STREPTOCOCCUS MITIS/ORALIS 12/26/2018     Lab Results  Component Value Date   ALBUMIN 2.2 (L) 12/27/2018   ALBUMIN 2.9 (L) 12/25/2018   ALBUMIN 4.3 01/06/2017    No results found for: MG No results found for: VD25OH  No results found for: PREALBUMIN CBC EXTENDED Latest Ref Rng & Units 12/31/2018 12/30/2018 12/29/2018  WBC 4.0 - 10.5 K/uL 11.7(H) 12.2(H) 10.6(H)  RBC 4.22 - 5.81 MIL/uL 3.26(L) 3.54(L) 3.26(L)  HGB 13.0 - 17.0 g/dL 9.7(L) 10.4(L) 9.5(L)  HCT 39.0 - 52.0 % 30.8(L) 33.9(L) 31.3(L)  PLT 150 - 400 K/uL 343 360 316  NEUTROABS 1.7 - 7.7 K/uL - - -  LYMPHSABS 0.7 - 4.0 K/uL - - -     There is no height or weight on file to calculate BMI.  Orders:  No orders of the defined types were placed in this encounter.  Meds ordered this encounter  Medications  . pentoxifylline (TRENTAL) 400 MG CR tablet    Sig: Take 1 tablet (400 mg total) by mouth 3 (three) times daily with meals.    Dispense:  60 tablet    Refill:  0  . nitroGLYCERIN (NITRODUR - DOSED IN MG/24 HR) 0.2 mg/hr patch    Sig: Place 1 patch (0.2 mg total) onto the skin daily. Apply to skin daily to different area of ankle  and foot. Remove previous patch    Dispense:  30 patch    Refill:  12  . cephALEXin (KEFLEX) 500 MG capsule    Sig: 1 po TID    Dispense:  48 capsule    Refill:  0     Procedures: No procedures performed  Clinical Data: No additional findings.  ROS:  All other systems negative, except as noted in the HPI. Review of Systems  Objective: Vital Signs: There were no vitals taken for this visit.  Specialty Comments:  No specialty comments available.  PMFS History: Patient Active Problem List   Diagnosis Date Noted  . Pyogenic inflammation of bone ([facility])   . Osteomyelitis of foot, left, acute ([facility]) 12/26/2018  . Right knee pain 10/04/2017  . Left hip pain 04/10/2013  . Diabetic peripheral neuropathy associated with type 2  diabetes mellitus ([facility]) 11/10/2012  . Marfan's syndrome 08/08/2012  . Healthcare maintenance 08/08/2012  . Atopic dermatitis 02/16/2012  . Overweight (BMI 25.0-29.9) 07/05/2011  . Allergic rhinitis due to allergen 07/03/2011  . Venous insufficiency of leg 04/03/2011  . Blindness 07/23/2010  . DM (diabetes mellitus), type 2 with neurological complications ([facility]) 06/13/2010  . Hypertension 06/13/2010  . Hyperlipidemia 06/13/2010   Past Medical History:  Diagnosis Date  . Allergy   . Blindness - both eyes 1967   Basketball injury  . Diabetes mellitus   . Hernia   . Hyperlipidemia   . Hypertension   . Marfan syndrome     Family History  Problem Relation Age of Onset  . Heart disease Mother   . Breast cancer Sister     Past Surgical History:  Procedure Laterality Date  . AMPUTATION Left 12/28/2018   Procedure: LEFT FOOT 3RD RAY AMPUTATION;  Surgeon: Nadara Mustard, MD;  Location: [facility];  Service: Orthopedics;  Laterality: Left;  . APPENDECTOMY    . INGUINAL HERNIA REPAIR     Right 1970s,    Left 2011 by Dr Andrey Campanile  . INGUINAL HERNIA REPAIR  08/25/10   recurrent left  . PENILE PROSTHESIS IMPLANT  2000s   [facility], Dr Brunilda Payor  . RETINAL DETACHMENT SURGERY  1970  . Rotator Cuff Repair  2009   Left side, Dr Elita Quick   . ROTATOR CUFF REPAIR  2010-2011?   right side   Social History   Occupational History  . 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 Smoking status: Former Smoker    Quit date: 02/14/1984    Years since quitting: 35.0  . Smokeless tobacco: Never Used  Substance and Sexual Activity  . Alcohol use: No    Comment: Quit in 10/1983  . Drug use: No  . Sexual activity: Not on file

## 2019-03-02 ENCOUNTER — Ambulatory Visit: Payer: Medicare Other | Admitting: Physician Assistant

## 2019-03-09 ENCOUNTER — Other Ambulatory Visit: Payer: Self-pay

## 2019-03-09 ENCOUNTER — Ambulatory Visit (INDEPENDENT_AMBULATORY_CARE_PROVIDER_SITE_OTHER): Payer: Medicare Other | Admitting: Physician Assistant

## 2019-03-09 ENCOUNTER — Encounter: Payer: Self-pay | Admitting: Physician Assistant

## 2019-03-09 VITALS — Ht 77.01 in | Wt 230.0 lb

## 2019-03-09 DIAGNOSIS — M86172 Other acute osteomyelitis, left ankle and foot: Secondary | ICD-10-CM

## 2019-03-09 NOTE — Progress Notes (Signed)
Office Visit Note   Patient: Chad Hall           Date of Birth: 1951/01/08           MRN: [id number] Visit Date: 03/09/2019              Requested by: Arlyce Harman, DO [address] [place],  [place] [address] PCP: Arlyce Harman, DO  Chief Complaint  Patient presents with  . Left Foot - Routine Post Op    12/28/2018 left foot 3rd ray      HPI: This is a pleasant gentleman who is vision impaired who follows up for his left foot third ray amputation 2-1/2 months ago he has been using nitroglycerin patches and Trental he feels his pain has decreased Assessment & Plan: Visit Diagnoses: No diagnosis found.  Plan: Currently stable.  He will continue current treatment and follow-up in 3 weeks  Follow-Up Instructions: No follow-ups on file.   Ortho Exam  Patient is alert, oriented, no adenopathy, well-dressed, normal affect, normal respiratory effort. Focused exam demonstrates findings consistent with status post third ray amputation.  Nitroglycerin patch is in place.  He does have a lot of fibrous tissue without significant granulation tissue.  However there is no surrounding cellulitis no foul odor no purulent drainage  Imaging: No results found. No images are attached to the encounter.  Labs: Lab Results  Component Value Date   HGBA1C 6.4 (H) 12/26/2018   HGBA1C 6.5 09/26/2018   HGBA1C 6.9 04/08/2018   REPTSTATUS 12/29/2018 FINAL 12/26/2018   GRAMSTAIN  12/26/2018    RARE WBC PRESENT, PREDOMINANTLY PMN MODERATE GRAM POSITIVE COCCI IN PAIRS MODERATE GRAM NEGATIVE RODS Performed at [facility], [address] [place], [place] [address]    CULT  12/26/2018    MODERATE ESCHERICHIA COLI MODERATE ALCALIGENES FAECALIS FEW STREPTOCOCCUS MITIS/ORALIS    LABORGA ESCHERICHIA COLI 12/26/2018   LABORGA ALCALIGENES FAECALIS 12/26/2018   LABORGA STREPTOCOCCUS MITIS/ORALIS 12/26/2018     Lab Results  Component Value Date   ALBUMIN 2.2 (L)  12/27/2018   ALBUMIN 2.9 (L) 12/25/2018   ALBUMIN 4.3 01/06/2017    No results found for: MG No results found for: VD25OH  No results found for: PREALBUMIN CBC EXTENDED Latest Ref Rng & Units 12/31/2018 12/30/2018 12/29/2018  WBC 4.0 - 10.5 K/uL 11.7(H) 12.2(H) 10.6(H)  RBC 4.22 - 5.81 MIL/uL 3.26(L) 3.54(L) 3.26(L)  HGB 13.0 - 17.0 g/dL 3.4(H) 10.4(L) 9.5(L)  HCT 39.0 - 52.0 % 30.8(L) 33.9(L) 31.3(L)  PLT 150 - 400 K/uL 343 360 316  NEUTROABS 1.7 - 7.7 K/uL - - -  LYMPHSABS 0.7 - 4.0 K/uL - - -     Body mass index is 27.27 kg/m.  Orders:  No orders of the defined types were placed in this encounter.  No orders of the defined types were placed in this encounter.    Procedures: No procedures performed  Clinical Data: No additional findings.  ROS:  All other systems negative, except as noted in the HPI. Review of Systems  Objective: Vital Signs: Ht 6' 5.01" (1.956 m)   Wt 230 lb (104.3 kg)   BMI 27.27 kg/m   Specialty Comments:  No specialty comments available.  PMFS History: Patient Active Problem List   Diagnosis Date Noted  . Pyogenic inflammation of bone ([facility])   . Osteomyelitis of foot, left, acute ([facility]) 12/26/2018  . Right knee pain 10/04/2017  . Left hip pain 04/10/2013  . Diabetic peripheral neuropathy  associated with type 2 diabetes mellitus ([facility]) 11/10/2012  . Marfan's syndrome 08/08/2012  . Healthcare maintenance 08/08/2012  . Atopic dermatitis 02/16/2012  . Overweight (BMI 25.0-29.9) 07/05/2011  . Allergic rhinitis due to allergen 07/03/2011  . Venous insufficiency of leg 04/03/2011  . Blindness 07/23/2010  . DM (diabetes mellitus), type 2 with neurological complications ([facility]) 38/25/0539  . Hypertension 06/13/2010  . Hyperlipidemia 06/13/2010   Past Medical History:  Diagnosis Date  . Allergy   . Blindness - both eyes 1967   Basketball injury  . Diabetes mellitus   . Hernia   . Hyperlipidemia   . Hypertension   . Marfan syndrome       Family History  Problem Relation Age of Onset  . Heart disease Mother   . Breast cancer Sister     Past Surgical History:  Procedure Laterality Date  . AMPUTATION Left 12/28/2018   Procedure: LEFT FOOT 3RD RAY AMPUTATION;  Surgeon: Newt Minion, MD;  Location: [facility];  Service: Orthopedics;  Laterality: Left;  . APPENDECTOMY    . INGUINAL HERNIA REPAIR     Right 1970s,    Left 2011 by Dr Redmond Pulling  . INGUINAL HERNIA REPAIR  08/25/10   recurrent left  . [facility], Dr Janice Norrie  . RETINAL DETACHMENT SURGERY  1970  . Rotator Cuff Repair  2009   Left side, Dr Hal Morales   . ROTATOR CUFF REPAIR  2010-2011?   right side   Social History   Occupational History  . Occupation: Publishing copy: [employer]  Tobacco Use  . Smoking status: Former Smoker    Quit date: 02/14/1984    Years since quitting: 35.0  . Smokeless tobacco: Never Used  Substance and Sexual Activity  . Alcohol use: No    Comment: Quit in 10/1983  . Drug use: No  . Sexual activity: Not on file

## 2019-03-13 ENCOUNTER — Other Ambulatory Visit: Payer: Self-pay | Admitting: Family Medicine

## 2019-03-13 ENCOUNTER — Other Ambulatory Visit: Payer: Self-pay | Admitting: Physician Assistant

## 2019-03-27 ENCOUNTER — Other Ambulatory Visit: Payer: Self-pay | Admitting: Physician Assistant

## 2019-03-30 ENCOUNTER — Ambulatory Visit (INDEPENDENT_AMBULATORY_CARE_PROVIDER_SITE_OTHER): Payer: Medicare Other | Admitting: Orthopedic Surgery

## 2019-03-30 ENCOUNTER — Encounter: Payer: Self-pay | Admitting: Physician Assistant

## 2019-03-30 ENCOUNTER — Other Ambulatory Visit: Payer: Self-pay

## 2019-03-30 ENCOUNTER — Other Ambulatory Visit: Payer: Self-pay | Admitting: Cardiothoracic Surgery

## 2019-03-30 VITALS — Ht 77.0 in | Wt 230.0 lb

## 2019-03-30 DIAGNOSIS — I712 Thoracic aortic aneurysm, without rupture, unspecified: Secondary | ICD-10-CM

## 2019-03-30 DIAGNOSIS — Z89422 Acquired absence of other left toe(s): Secondary | ICD-10-CM

## 2019-03-30 NOTE — Progress Notes (Signed)
Office Visit Note   Patient: Chad Hall           Date of Birth: Dec 24, 1950           MRN: [id number] Visit Date: 03/30/2019              Requested by: Nuala Alpha, DO [address],  [place] [address] PCP: Nuala Alpha, DO  Chief Complaint  Patient presents with  . Left Foot - Routine Post Op    12/28/18 left foot 3rd ray amputation       HPI: Patient is a 69 year old gentleman who is status post left foot third ray amputation.  He is completing his course of Keflex and he is still using Trental and a nitroglycerin patch.  Patient is in regular shoewear states he feels well has no concerns.  Assessment & Plan: Visit Diagnoses:  1. Hx of amputation of lesser toe, left ([place])     Plan: Patient will complete his Keflex continue washing with soap and water dry dressing change daily continue the nitroglycerin patch daily continue with Trental and follow-up in 4 weeks.  Follow-Up Instructions: Return in about 4 weeks (around 04/27/2019).   Ortho Exam  Patient is alert, oriented, no adenopathy, well-dressed, normal affect, normal respiratory effort. Examination patient has interval healing from the third ray amputation.  After debridement of fibrinous exudative tissue there is good healthy granulation tissue with a small amount of nonviable tendon that was debrided.  There is no redness no cellulitis no odor no signs of infection.  Iodosorb and a gauze was applied.  Patient is showing good interval healing.  Imaging: No results found. No images are attached to the encounter.  Labs: Lab Results  Component Value Date   HGBA1C 6.4 (H) 12/26/2018   HGBA1C 6.5 09/26/2018   HGBA1C 6.9 04/08/2018   REPTSTATUS 12/29/2018 FINAL 12/26/2018   GRAMSTAIN  12/26/2018    RARE WBC PRESENT, PREDOMINANTLY PMN MODERATE GRAM POSITIVE COCCI IN PAIRS MODERATE GRAM NEGATIVE RODS Performed at [facility], [place] [address], [place], [place] [address]    CULT   12/26/2018    MODERATE ESCHERICHIA COLI MODERATE ALCALIGENES FAECALIS FEW STREPTOCOCCUS MITIS/ORALIS    LABORGA ESCHERICHIA COLI 12/26/2018   LABORGA ALCALIGENES FAECALIS 12/26/2018   LABORGA STREPTOCOCCUS MITIS/ORALIS 12/26/2018     Lab Results  Component Value Date   ALBUMIN 2.2 (L) 12/27/2018   ALBUMIN 2.9 (L) 12/25/2018   ALBUMIN 4.3 01/06/2017    No results found for: MG No results found for: VD25OH  No results found for: PREALBUMIN CBC EXTENDED Latest Ref Rng & Units 12/31/2018 12/30/2018 12/29/2018  WBC 4.0 - 10.5 K/uL 11.7(H) 12.2(H) 10.6(H)  RBC 4.22 - 5.81 MIL/uL 3.26(L) 3.54(L) 3.26(L)  HGB 13.0 - 17.0 g/dL 9.7(L) 10.4(L) 9.5(L)  HCT 39.0 - 52.0 % 30.8(L) 33.9(L) 31.3(L)  PLT 150 - 400 K/uL 343 360 316  NEUTROABS 1.7 - 7.7 K/uL - - -  LYMPHSABS 0.7 - 4.0 K/uL - - -     Body mass index is 27.27 kg/m.  Orders:  No orders of the defined types were placed in this encounter.  No orders of the defined types were placed in this encounter.    Procedures: No procedures performed  Clinical Data: No additional findings.  ROS:  All other systems negative, except as noted in the HPI. Review of Systems  Objective: Vital Signs: Ht 6\' 5"  (1.956 m)   Wt 230 lb (104.3 kg)   BMI  27.27 kg/m   Specialty Comments:  No specialty comments available.  PMFS History: Patient Active Problem List   Diagnosis Date Noted  . Pyogenic inflammation of bone (HCC)   . Osteomyelitis of foot, left, acute (HCC) 12/26/2018  . Right knee pain 10/04/2017  . Left hip pain 04/10/2013  . Diabetic peripheral neuropathy associated with type 2 diabetes mellitus (HCC) 11/10/2012  . Marfan's syndrome 08/08/2012  . Healthcare maintenance 08/08/2012  . Atopic dermatitis 02/16/2012  . Overweight (BMI 25.0-29.9) 07/05/2011  . Allergic rhinitis due to allergen 07/03/2011  . Venous insufficiency of leg 04/03/2011  . Blindness 07/23/2010  . DM (diabetes mellitus), type 2 with  neurological complications (HCC) 06/13/2010  . Hypertension 06/13/2010  . Hyperlipidemia 06/13/2010   Past Medical History:  Diagnosis Date  . Allergy   . Blindness - both eyes 1967   Basketball injury  . Diabetes mellitus   . Hernia   . Hyperlipidemia   . Hypertension   . Marfan syndrome     Family History  Problem Relation Age of Onset  . Heart disease Mother   . Breast cancer Sister     Past Surgical History:  Procedure Laterality Date  . AMPUTATION Left 12/28/2018   Procedure: LEFT FOOT 3RD RAY AMPUTATION;  Surgeon: Nadara Mustard, MD;  Location: [facility] OR;  Service: Orthopedics;  Laterality: Left;  . APPENDECTOMY    . INGUINAL HERNIA REPAIR     Right 1970s,    Left 2011 by Dr Andrey Campanile  . INGUINAL HERNIA REPAIR  08/25/10   recurrent left  . PENILE PROSTHESIS IMPLANT  2000s   [facility], Dr Brunilda Payor  . RETINAL DETACHMENT SURGERY  1970  . Rotator Cuff Repair  2009   Left side, Dr Elita Quick   . ROTATOR CUFF REPAIR  2010-2011?   right side   Social History   Occupational History  . 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 Smoking status: Former Smoker    Quit date: 02/14/1984    Years since quitting: 35.1  . Smokeless tobacco: Never Used  Substance and Sexual Activity  . Alcohol use: No    Comment: Quit in 10/1983  . Drug use: No  . Sexual activity: Not on file

## 2019-04-04 ENCOUNTER — Other Ambulatory Visit: Payer: Self-pay | Admitting: Physician Assistant

## 2019-04-12 ENCOUNTER — Other Ambulatory Visit: Payer: Self-pay | Admitting: Physician Assistant

## 2019-04-20 ENCOUNTER — Encounter: Payer: Self-pay | Admitting: Physician Assistant

## 2019-04-20 ENCOUNTER — Other Ambulatory Visit: Payer: Self-pay

## 2019-04-20 ENCOUNTER — Ambulatory Visit (INDEPENDENT_AMBULATORY_CARE_PROVIDER_SITE_OTHER): Payer: Medicare Other | Admitting: Physician Assistant

## 2019-04-20 VITALS — Ht 77.0 in | Wt 230.0 lb

## 2019-04-20 DIAGNOSIS — Z89422 Acquired absence of other left toe(s): Secondary | ICD-10-CM | POA: Diagnosis not present

## 2019-04-20 NOTE — Progress Notes (Signed)
Office Visit Note   Patient: Chad Hall           Date of Birth: 18-Nov-1950           MRN: [id number] Visit Date: 04/20/2019              Requested by: Nuala Alpha, DO [address],  [place] [address] PCP: Nuala Alpha, DO  Chief Complaint  Patient presents with  . Left Foot - Follow-up    12/28/18 left foot 3rd ray amputation       HPI: This is a pleasant gentleman who is 3 months status post left foot third ray amputation that is been slow to heal.  He has been doing silver alginate dressing Nitropatch and the incision continues to heal well.  He has no odor and only a spot of drainage  Assessment & Plan: Visit Diagnoses: No diagnosis found.  Plan: He will continue with dressing changes.  Continue using nitroglycerin patch and taking trental.   Follow-Up Instructions: 3 weeks  Ortho Exam  Patient is alert, oriented, no adenopathy, well-dressed, normal affect, normal respiratory effort. Focused examination of the incision site demonstrates continued granulation tissue healing in.  Across the dorsum of the foot and in the webspace he has healthy pink tissue.  He still has some gray tissue on the plantar surface however there is no foul odor this is fairly dry.  He does have a thickened callus in the area which with verbal permission I did debride to healthy surfaces. Imaging: No results found. No images are attached to the encounter.  Labs: Lab Results  Component Value Date   HGBA1C 6.4 (H) 12/26/2018   HGBA1C 6.5 09/26/2018   HGBA1C 6.9 04/08/2018   REPTSTATUS 12/29/2018 FINAL 12/26/2018   GRAMSTAIN  12/26/2018    RARE WBC PRESENT, PREDOMINANTLY PMN MODERATE GRAM POSITIVE COCCI IN PAIRS MODERATE GRAM NEGATIVE RODS Performed at [facility], [place] [address] [place], [place] [address]    CULT  12/26/2018    MODERATE ESCHERICHIA COLI MODERATE ALCALIGENES FAECALIS FEW STREPTOCOCCUS MITIS/ORALIS    LABORGA ESCHERICHIA COLI  12/26/2018   LABORGA ALCALIGENES FAECALIS 12/26/2018   LABORGA STREPTOCOCCUS MITIS/ORALIS 12/26/2018     Lab Results  Component Value Date   ALBUMIN 2.2 (L) 12/27/2018   ALBUMIN 2.9 (L) 12/25/2018   ALBUMIN 4.3 01/06/2017    No results found for: MG No results found for: VD25OH  No results found for: PREALBUMIN CBC EXTENDED Latest Ref Rng & Units 12/31/2018 12/30/2018 12/29/2018  WBC 4.0 - 10.5 K/uL 11.7(H) 12.2(H) 10.6(H)  RBC 4.22 - 5.81 MIL/uL 3.26(L) 3.54(L) 3.26(L)  HGB 13.0 - 17.0 g/dL 9.7(L) 10.4(L) 9.5(L)  HCT 39.0 - 52.0 % 30.8(L) 33.9(L) 31.3(L)  PLT 150 - 400 K/uL 343 360 316  NEUTROABS 1.7 - 7.7 K/uL - - -  LYMPHSABS 0.7 - 4.0 K/uL - - -     Body mass index is 27.27 kg/m.  Orders:  No orders of the defined types were placed in this encounter.  No orders of the defined types were placed in this encounter.    Procedures: No procedures performed  Clinical Data: No additional findings.  ROS:  All other systems negative, except as noted in the HPI. Review of Systems  Objective: Vital Signs: Ht 6\' 5"  (1.956 m)   Wt 230 lb (104.3 kg)   BMI 27.27 kg/m   Specialty Comments:  No specialty comments available.  PMFS History: Patient Active Problem List  Diagnosis Date Noted  . Pyogenic inflammation of bone (HCC)   . Osteomyelitis of foot, left, acute (HCC) 12/26/2018  . Right knee pain 10/04/2017  . Left hip pain 04/10/2013  . Diabetic peripheral neuropathy associated with type 2 diabetes mellitus (HCC) 11/10/2012  . Marfan's syndrome 08/08/2012  . Healthcare maintenance 08/08/2012  . Atopic dermatitis 02/16/2012  . Overweight (BMI 25.0-29.9) 07/05/2011  . Allergic rhinitis due to allergen 07/03/2011  . Venous insufficiency of leg 04/03/2011  . Blindness 07/23/2010  . DM (diabetes mellitus), type 2 with neurological complications (HCC) 06/13/2010  . Hypertension 06/13/2010  . Hyperlipidemia 06/13/2010   Past Medical History:  Diagnosis  Date  . Allergy   . Blindness - both eyes 1967   Basketball injury  . Diabetes mellitus   . Hernia   . Hyperlipidemia   . Hypertension   . Marfan syndrome     Family History  Problem Relation Age of Onset  . Heart disease Mother   . Breast cancer Sister     Past Surgical History:  Procedure Laterality Date  . AMPUTATION Left 12/28/2018   Procedure: LEFT FOOT 3RD RAY AMPUTATION;  Surgeon: Nadara Mustard, MD;  Location: [facility] OR;  Service: Orthopedics;  Laterality: Left;  . APPENDECTOMY    . INGUINAL HERNIA REPAIR     Right 1970s,    Left 2011 by Dr Andrey Campanile  . INGUINAL HERNIA REPAIR  08/25/10   recurrent left  . PENILE PROSTHESIS IMPLANT  2000s   [facility], Dr Brunilda Payor  . RETINAL DETACHMENT SURGERY  1970  . Rotator Cuff Repair  2009   Left side, Dr Elita Quick   . ROTATOR CUFF REPAIR  2010-2011?   right side   Social History   Occupational History  . 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 Smoking status: Former Smoker    Quit date: 02/14/1984    Years since quitting: 35.2  . Smokeless tobacco: Never Used  Substance and Sexual Activity  . Alcohol use: No    Comment: Quit in 10/1983  . Drug use: No  . Sexual activity: Not on file

## 2019-04-24 ENCOUNTER — Other Ambulatory Visit: Payer: Self-pay | Admitting: Physician Assistant

## 2019-04-24 ENCOUNTER — Other Ambulatory Visit: Payer: Self-pay | Admitting: Family Medicine

## 2019-05-04 ENCOUNTER — Ambulatory Visit (INDEPENDENT_AMBULATORY_CARE_PROVIDER_SITE_OTHER): Payer: Medicare Other | Admitting: Family Medicine

## 2019-05-04 ENCOUNTER — Other Ambulatory Visit: Payer: Self-pay

## 2019-05-04 VITALS — BP 145/80 | HR 89 | Wt 207.2 lb

## 2019-05-04 DIAGNOSIS — E785 Hyperlipidemia, unspecified: Secondary | ICD-10-CM

## 2019-05-04 DIAGNOSIS — E1149 Type 2 diabetes mellitus with other diabetic neurological complication: Secondary | ICD-10-CM

## 2019-05-04 DIAGNOSIS — I10 Essential (primary) hypertension: Secondary | ICD-10-CM

## 2019-05-04 DIAGNOSIS — M86172 Other acute osteomyelitis, left ankle and foot: Secondary | ICD-10-CM | POA: Diagnosis not present

## 2019-05-04 LAB — POCT GLYCOSYLATED HEMOGLOBIN (HGB A1C): HbA1c, POC (controlled diabetic range): 7 % (ref 0.0–7.0)

## 2019-05-04 NOTE — Assessment & Plan Note (Signed)
Ordered home health due to patient being visually impaired. He will benefit from periodic checks of his feet for ulcers. Will also benefit for someone to check on his healing post surgical wound on his left foot s/p amputation of left 3rd ray.

## 2019-05-04 NOTE — Assessment & Plan Note (Signed)
Checking lipid panel today - Cont Atorvastatin 80mg  once daily

## 2019-05-04 NOTE — Patient Instructions (Signed)
It was great to see you today! Thank you for letting me participate in your care!  Today, we discussed your blood pressure and diabetes. Your blood pressure was slightly elevated today in the office but your checks at home reveal it has gotten low. I will not make any medications changes for your blood pressure today and we will continue to follow.  Your diabetes is under good control, keep it up! I have ordered home health for you to come and periodically check your feet for ulcers and to look at your foot wound after amputation.  Be well, Chad Schick, DO PGY-3, [facility]

## 2019-05-04 NOTE — Progress Notes (Signed)
    SUBJECTIVE:   CHIEF COMPLAINT / HPI:   HTN Well controlled at home as patient checks it every morning. He states since he got discharged from the hospital his BP has been low, with systolics below 100. He waits and rechecks it and it does get above 100, especially with activity. No readings at home with it over 140. He denies any dizziness, syncope or near syncope, falls, confusion, or fatigue.  DM Well controlled with A1c today of 7.0%. While it is increased since last visit he is still at goal. I would consider any A1c under 7.5% at goal for him. He takes multiple oral medications and continues to take them as prescribed with no side effects.  HLD Last lipid panel 01/06/2017 with LDL of 71, he is currently on Atorvastatin 80mg  as he was on 40mg  but this was increased to 80mg  during his last hospitalization. No muscle aches or pains, tolerating medication well. Repeat lipid panel today.  PERTINENT  PMH / PSH: HTN, T2DM, HLD, S/p left 3rd ray amputation, atopic dermatitis, visually imparied  OBJECTIVE:   BP (!) 145/80   Pulse 89   Wt 207 lb 3.2 oz (94 kg)   SpO2 98%   BMI 24.57 kg/m   Gen: Alert and Oriented x 3, NAD CV: RRR, no murmurs, normal S1, S2 split Resp: CTAB, no wheezing, rales, or rhonchi, comfortable work of breathing Abd: non-distended, non-tender, soft, +bs in all four quadrants Ext: no clubbing, cyanosis, or edema Skin: warm, dry, intact, no rashes  ASSESSMENT/PLAN:   Hypertension Well controlled at home with SBP in the low 100s and some below that. No reported symptoms of hypotension so no changes at this time. His SBP was elevated in office today at 145 but usually is elevated at office readings. Suspect white coat HTN. - Cont Enalapril 20mg  daily - Cont Metoprolol 12.5mg  daily  Osteomyelitis of foot, left, acute (HCC) Ordered home health due to patient being visually impaired. He will benefit from periodic checks of his feet for ulcers. Will also benefit  for someone to check on his healing post surgical wound on his left foot s/p amputation of left 3rd ray.  Hyperlipidemia Checking lipid panel today - Cont Atorvastatin 80mg  once daily    , DO [facility]

## 2019-05-04 NOTE — Assessment & Plan Note (Addendum)
Well controlled at home with SBP in the low 100s and some below that. No reported symptoms of hypotension so no changes at this time. His SBP was elevated in office today at 145 but usually is elevated at office readings. Suspect white coat HTN. - Cont Enalapril 20mg  daily - Cont Metoprolol 12.5mg  daily

## 2019-05-05 ENCOUNTER — Ambulatory Visit: Payer: Medicare Other | Attending: Internal Medicine

## 2019-05-05 DIAGNOSIS — Z23 Encounter for immunization: Secondary | ICD-10-CM

## 2019-05-05 LAB — LIPID PANEL
Chol/HDL Ratio: 1.9 ratio (ref 0.0–5.0)
Cholesterol, Total: 137 mg/dL (ref 100–199)
HDL: 72 mg/dL (ref >39)
LDL Chol Calc (NIH): 53 mg/dL (ref 0–99)
Triglycerides: 52 mg/dL (ref 0–149)
VLDL Cholesterol Cal: 12 mg/dL (ref 5–40)

## 2019-05-05 NOTE — Progress Notes (Signed)
   Covid-19 Vaccination Clinic  Name:  Chad Hall    MRN: [id number] DOB: Jan 21, 1951  05/05/2019  [name] was observed post Covid-19 immunization for 15 minutes without incident. He was provided with Vaccine Information Sheet and instruction to access the V-Safe system.   [name] was instructed to call 911 with any severe reactions post vaccine: Marland Kitchen Difficulty breathing  . Swelling of face and throat  . A fast heartbeat  . A bad rash all over body  . Dizziness and weakness   Immunizations Administered    Name Date Dose VIS Date Route   Pfizer COVID-19 Vaccine 05/05/2019  2:35 PM 0.3 mL 01/27/2019 Intramuscular   Manufacturer: [employer], [employer]   Lot: [id number]   NDC: [address]-2

## 2019-05-08 ENCOUNTER — Telehealth (INDEPENDENT_AMBULATORY_CARE_PROVIDER_SITE_OTHER): Payer: Medicare Other | Admitting: Cardiothoracic Surgery

## 2019-05-08 ENCOUNTER — Other Ambulatory Visit: Payer: Self-pay

## 2019-05-08 ENCOUNTER — Ambulatory Visit
Admission: RE | Admit: 2019-05-08 | Discharge: 2019-05-08 | Disposition: A | Discharge status: Home / Self Care | Payer: Medicare Other | Source: Ambulatory Visit | Attending: Cardiothoracic Surgery | Admitting: Cardiothoracic Surgery

## 2019-05-08 DIAGNOSIS — I712 Thoracic aortic aneurysm, without rupture, unspecified: Secondary | ICD-10-CM

## 2019-05-08 IMAGING — CT CT CHEST W/O CM
2 of 4 series · 13 of 36 positions shown, 16 images · non-contrast
Comparison: [DATE]

CLINICAL DATA: Follow-up thoracic aneurysm

EXAM:
CT CHEST WITHOUT CONTRAST
TECHNIQUE: Multidetector CT imaging of the chest was performed following the
standard protocol without IV contrast.

[Series 2: chest 2.00 br40 s3 · axial · 0.65mm/px · z∈[+1583,+1884]mm · 10 of 179 slices shown, 13 images (1 of 2)]
[im 14/179  mediastinal]
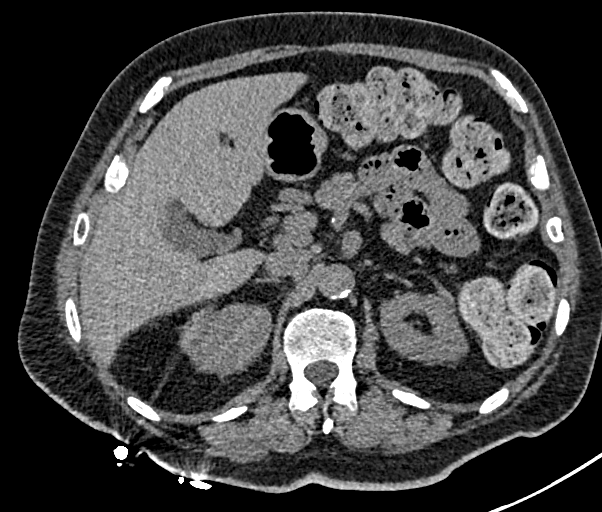
[im 14/179  lung]
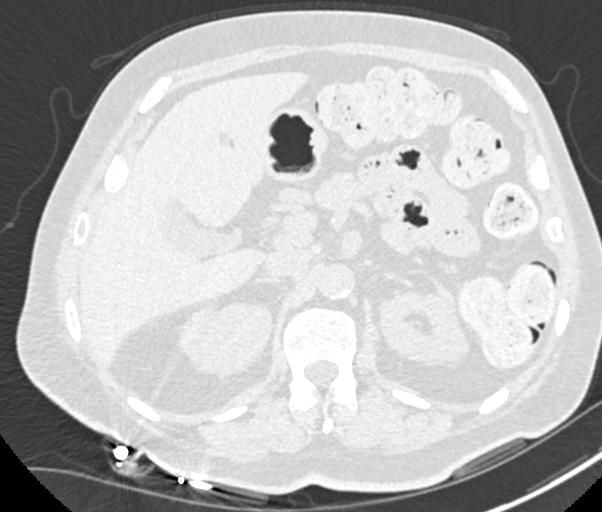
[im 28/179  lung]
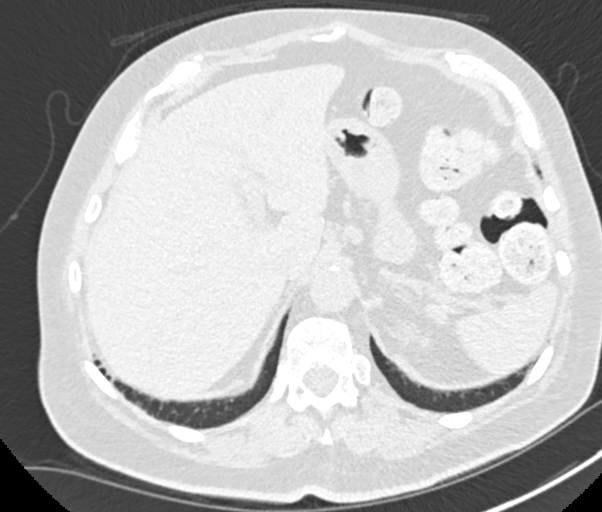
[im 55/179  lung]
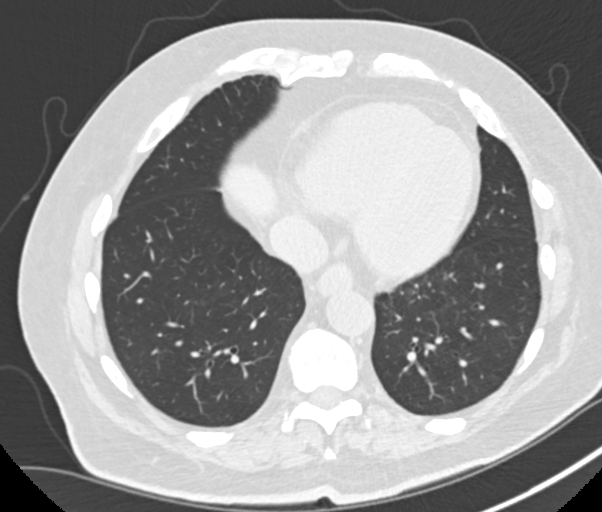
[im 69/179  lung]
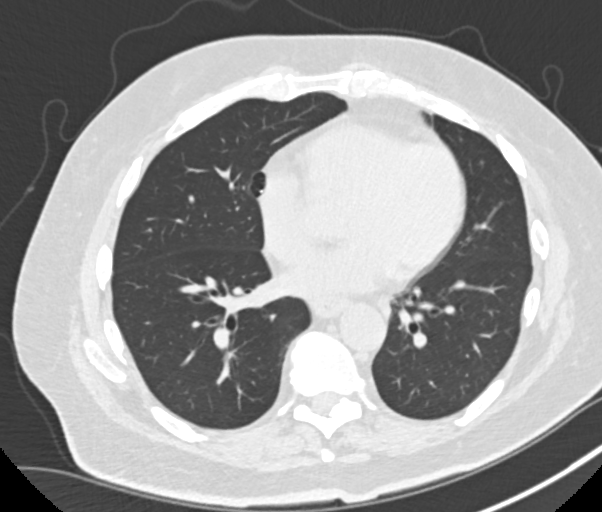
[im 83/179  mediastinal]
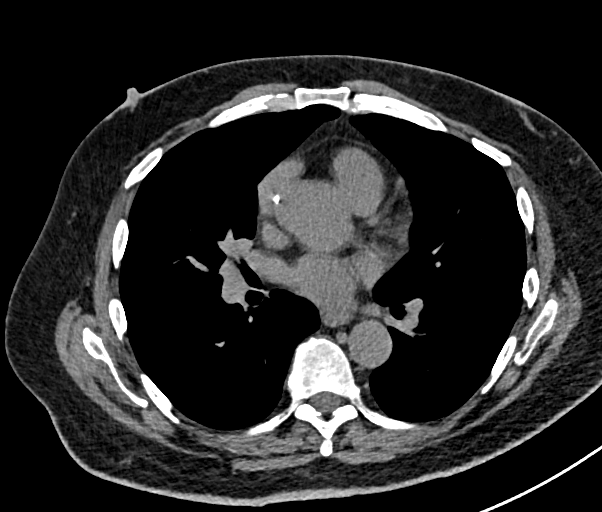
[im 83/179  lung]
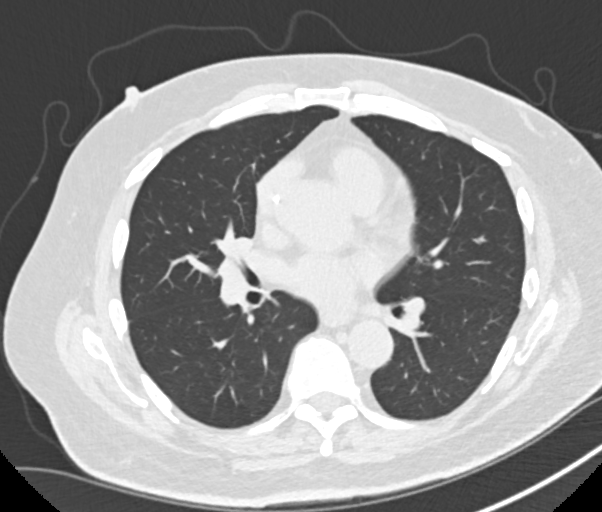
[im 96/179  lung]
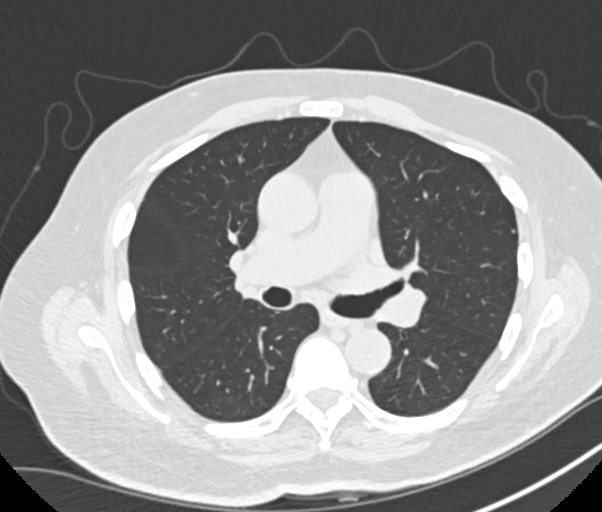
[im 110/179  lung]
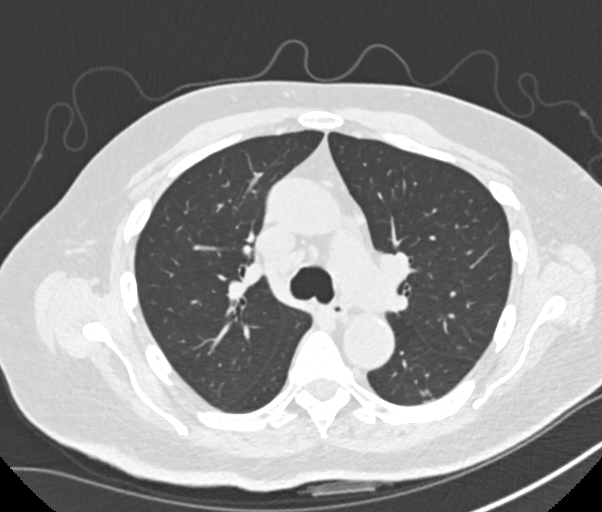
[im 137/179  lung]
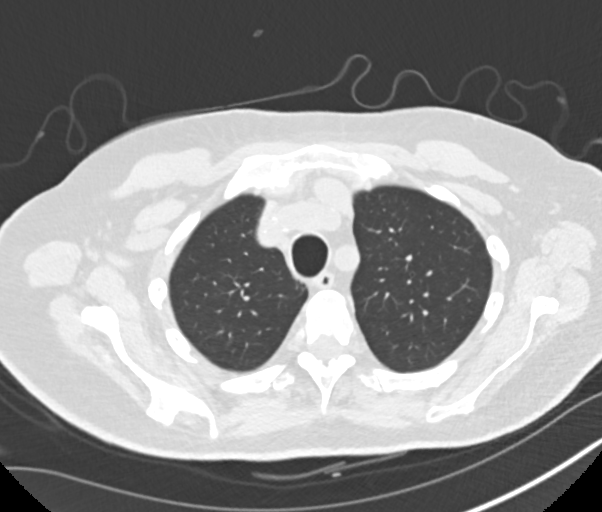
[im 151/179  mediastinal]
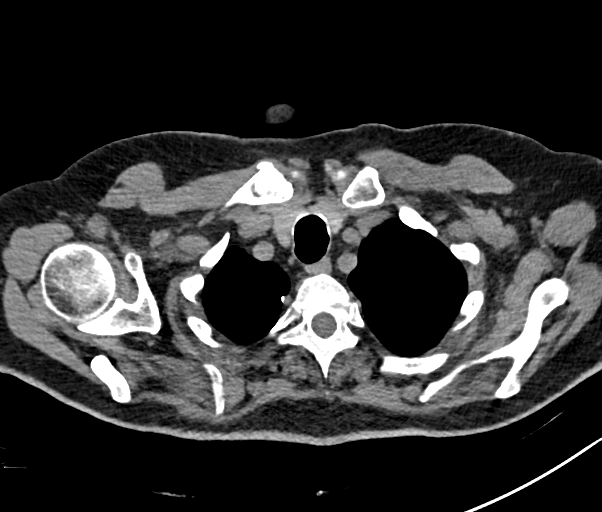
[im 151/179  lung]
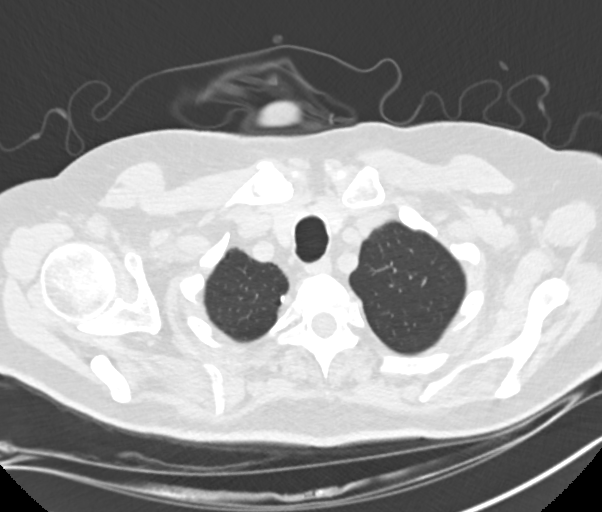
[im 165/179  lung]
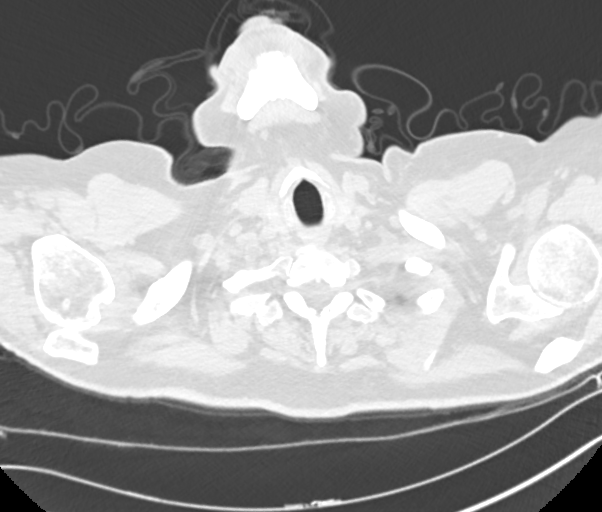

[Series 4: chest 2.00 br40 s3 · coronal · 0.70mm/px · 3 of 165 slices shown (2 of 2)]
[im 33/165  lung]
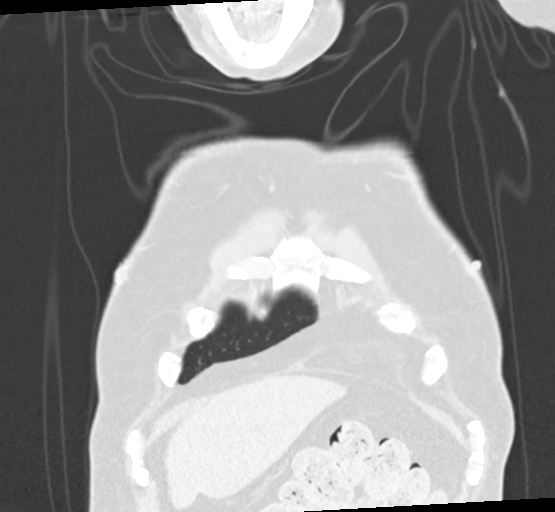
[im 66/165  lung]
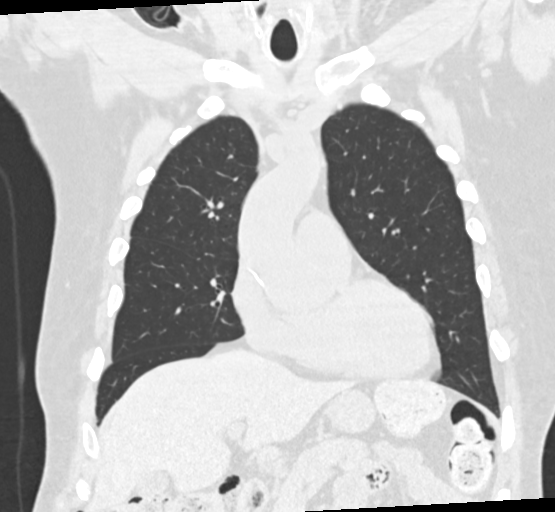
[im 99/165  lung]
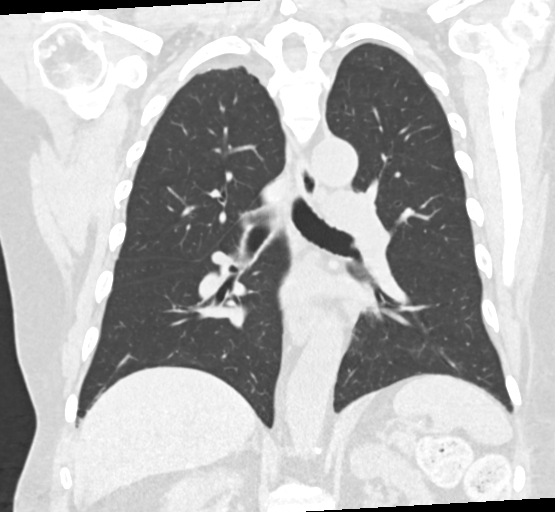

[13 of 36 positions shown; findings below may reference images not displayed]

FINDINGS: Cardiovascular: Somewhat limited due to lack of IV contrast. The
ascending aorta is prominent at 38 mm. Sided tubular junction is
relatively stable measuring 34 mm. Mild atherosclerotic changes of
the ascending aorta are seen. Evaluation of the aorta at the level
of the sinus of Valsalva is somewhat limited due to the lack of IV
contrast but measures approximately the 4.1 cm. Truncus anomaly is
noted with common origin of the right innominate artery and left
common carotid artery. The left subclavian artery is widely patent.
Descending thoracic aorta demonstrates atherosclerotic
calcifications without aneurysmal dilatation. No significant cardiac
enlargement is seen. Mild coronary calcifications are noted.

Mediastinum/Nodes: Thoracic inlet is within normal limits. Scattered
small lymph nodes are noted within the mediastinum stable in
appearance from the prior exam. No sizable adenopathy is noted. The
esophagus as visualized is within normal limits.

Lungs/Pleura: Lungs are well aerated bilaterally. There is a 5 mm
nodule without significant calcification in the superior segment of
the left lower lobe best seen on image number 71 of series 8. This
is stable in appearance from the prior exam. A few tiny subpleural
nodules are seen particularly in the left upper lobe.

Upper Abdomen: Renal vascular calcifications are noted. The
remainder of the visualized upper abdomen is within normal limits.

Musculoskeletal: Degenerative change of the thoracic spine and
shoulder joints are noted. No acute bony abnormality is seen.
IMPRESSION: Stable appearance of the ascending aorta measuring up to 3.8 mm in
the ascending portion.

Scattered small nodules stable in appearance from the prior exam. No
follow-up needed if patient is low-risk (and has no known or
suspected primary neoplasm). Non-contrast chest CT can be considered
in 12 months if patient is high-risk. This recommendation follows
the consensus statement: Guidelines for Management of Incidental
Pulmonary Nodules Detected on CT Images: From the [HOSPITAL]

Aortic Atherosclerosis ([J7]-[J7]).

## 2019-05-08 NOTE — Progress Notes (Signed)
[address]       [place] [address]             [phone]     CARDIOTHORACIC SURGERY OFFICE NOTE Referring Provider is Dollene Cleveland, DO Primary Cardiologist is No primary care provider on file. PCP is Arlyce Harman, DO  Virtual Visit via Telephone Note  I connected with [name] on 05/08/19 at 10:30 AM EDT by telephone and verified that I am speaking with the correct person using two identifiers.  Location: Patient: Chad Hall, Chad Hall Provider: Nicholos Johns   I discussed the limitations, risks, security and privacy concerns of performing an evaluation and management service by telephone and the availability of in person appointments. I also discussed with the patient that there may be a patient responsible charge related to this service. The patient expressed understanding and agreed to proceed.   History of Present Illness: Pt is contacted regarding follow-up of aortic root aneurysm, h/o Marfan's syndrome. He had a CT scan today for surveillance He denies new onset chest pain, shortness of breath, peripheral edema, or orthopnea.     Observations/Objective: No direct examination  Assessment and Plan: Current Outpatient Medications  Medication Sig Dispense Refill  . acetaminophen (TYLENOL) 325 MG tablet Take 2 tablets (650 mg total) by mouth every 6 (six) hours as needed for mild pain (or Fever >/= 101). 30 tablet 0  . atorvastatin (LIPITOR) 80 MG tablet TAKE 1 TABLET BY MOUTH  DAILY AT 6 PM 30 tablet 11  . augmented betamethasone dipropionate (DIPROLENE-AF) 0.05 % cream APPLY TOPICALLY AS DIRECTED TWO TIMES DAILY 100 g 0  . cephALEXin (KEFLEX) 500 MG capsule TAKE 1 CAPSULE BY MOUTH 3  TIMES DAILY FOR 16 DAYS 48 capsule 0  . cholecalciferol (VITAMIN D3) 25 MCG (1000 UT) tablet Take 1,000 Units by mouth at bedtime.    . docusate sodium (COLACE) 100 MG capsule Take 1 capsule (100 mg total) by mouth daily as needed for mild constipation. 10  capsule 0  . enalapril (VASOTEC) 20 MG tablet TAKE 1 TABLET BY MOUTH  DAILY (Patient taking differently: Take 20 mg by mouth at bedtime. ) 90 tablet 3  . glipiZIDE (GLUCOTROL) 10 MG tablet Take 1 tablet (10 mg total) by mouth at bedtime. 30 tablet 3  . glucose blood (PRODIGY NO CODING BLOOD GLUC) test strip USE TO CHECK BLOOD SUGAR  THREE TIMES DAILY OR AS  DIRECTED 300 strip 3  . JARDIANCE 10 MG TABS tablet TAKE 1 TABLET BY MOUTH  DAILY (Patient taking differently: Take 10 mg by mouth daily. ) 90 tablet 3  . metoprolol succinate (TOPROL-XL) 25 MG 24 hr tablet TAKE ONE-HALF TABLET BY  MOUTH DAILY (Patient taking differently: Take 12.5 mg by mouth every evening. ) 45 tablet 3  . nitroGLYCERIN (NITRODUR - DOSED IN MG/24 HR) 0.2 mg/hr patch Place 1 patch (0.2 mg total) onto the skin daily. Apply to skin daily to different area of ankle and foot. Remove previous patch 30 patch 12  . ondansetron (ZOFRAN) 4 MG tablet Take 1 tablet (4 mg total) by mouth every 8 (eight) hours as needed for nausea or vomiting. 5 tablet 0  . oxyCODONE (OXY IR/ROXICODONE) 5 MG immediate release tablet Take 1 tablet (5 mg total) by mouth every 6 (six) hours as needed for severe pain (pain score 6-10). 5 tablet 0  . pentoxifylline (TRENTAL) 400 MG CR tablet TAKE 1 TABLET BY MOUTH 3  TIMES DAILY  WITH MEALS 60 tablet 0  . Prodigy Twist Top Lancets 28G MISC CHECK BLOOD SUGAR 3 TIMES  DAILY 300 each 3  . TRADJENTA 5 MG TABS tablet TAKE 1 TABLET BY MOUTH  DAILY (Patient taking differently: Take 5 mg by mouth at bedtime. ) 90 tablet 3  . triamcinolone cream (KENALOG) 0.1 % APPLY TO AFFECTED AREA(S)  TOPICALLY TWICE DAILY 400 g 1   No current facility-administered medications for this visit.    Follow Up Instructions:  Will repeat CT chest and echo in 3 months; have counseled him to report new onset sx asap.  I discussed the assessment and treatment plan with the patient. The patient was provided an opportunity to ask questions and  all were answered. The patient agreed with the plan and demonstrated an understanding of the instructions.   The patient was advised to call back or seek an in-person evaluation if the symptoms worsen or if the condition fails to improve as anticipated.  I provided 15 minutes of non-face-to-face time during this encounter.   Wonda Olds, MD 05/08/2019 10:35 AM

## 2019-05-09 ENCOUNTER — Telehealth: Payer: Self-pay | Admitting: *Deleted

## 2019-05-09 NOTE — Telephone Encounter (Signed)
Gavin Pound from [facility] calling for wound care verbal orders as follows:  3 time(s) weekly for 3 week(s), then 2 time(s) weekly for 4 week(s), then 1 time(s) weekly for 1 week(s)  You can leave verbal orders on confidential voicemail.  Jone Baseman, CMA

## 2019-05-10 ENCOUNTER — Other Ambulatory Visit: Payer: Self-pay | Admitting: Physician Assistant

## 2019-05-22 ENCOUNTER — Ambulatory Visit: Payer: Medicare Other | Admitting: Physician Assistant

## 2019-05-22 ENCOUNTER — Encounter: Payer: Self-pay | Admitting: Physician Assistant

## 2019-05-22 ENCOUNTER — Other Ambulatory Visit: Payer: Self-pay

## 2019-05-22 VITALS — Ht 77.0 in | Wt 207.0 lb

## 2019-05-22 DIAGNOSIS — M86172 Other acute osteomyelitis, left ankle and foot: Secondary | ICD-10-CM | POA: Diagnosis not present

## 2019-05-22 NOTE — Progress Notes (Signed)
Office Visit Note   Patient: Chad Hall           Date of Birth: [birth year]           MRN: [id number] Visit Date: 05/22/2019              Requested by: Nuala Alpha, DO [address],  [place] [address] PCP: Nuala Alpha, DO  Chief Complaint  Patient presents with  . Left Foot - Follow-up    12/28/2018 Left Foot 3rd Ray Amputation      HPI: This is a pleasant gentleman who is 5 months status post left foot third ray amputation.  This had been slow to heal.  He has been using a nitroglycerin patch and Trental.  He is also been using a silver alginate dressing.  He now has nurses that come and do wound care 3 times a week.  He denies smelling any foul odor or feelings of drainage from the wound.  He is visually impaired   Assessment & Plan: Visit Diagnoses: No diagnosis found.  Plan: He continues to improve.  He is asking if he can come in in 2 months as he is having home health nurses take care of this 3 times a week.  I am happy with the job you are doing and I think this is reasonable.  We of course we will see him back at any time if they notice any changes Follow-Up Instructions: No follow-ups on file.   Ortho Exam Focused examination of his foot demonstrates no erythema minimal soft tissue swelling he has had interval healing with just a very small area of wound dehiscence between his toes.  No foul odor no fluctuance   Imaging: No results found. No images are attached to the encounter.  Labs: Lab Results  Component Value Date   HGBA1C 7.0 05/04/2019   HGBA1C 6.4 (H) 12/26/2018   HGBA1C 6.5 09/26/2018   REPTSTATUS 12/29/2018 FINAL 12/26/2018   GRAMSTAIN  12/26/2018    RARE WBC PRESENT, PREDOMINANTLY PMN MODERATE GRAM POSITIVE COCCI IN PAIRS MODERATE GRAM NEGATIVE RODS Performed at [facility], [address] [place], [place] [address]    CULT  12/26/2018    MODERATE ESCHERICHIA COLI MODERATE ALCALIGENES FAECALIS FEW  STREPTOCOCCUS MITIS/ORALIS    LABORGA ESCHERICHIA COLI 12/26/2018   LABORGA ALCALIGENES FAECALIS 12/26/2018   LABORGA STREPTOCOCCUS MITIS/ORALIS 12/26/2018     Lab Results  Component Value Date   ALBUMIN 2.2 (L) 12/27/2018   ALBUMIN 2.9 (L) 12/25/2018   ALBUMIN 4.3 01/06/2017    No results found for: MG No results found for: VD25OH  No results found for: PREALBUMIN CBC EXTENDED Latest Ref Rng & Units 12/31/2018 12/30/2018 12/29/2018  WBC 4.0 - 10.5 K/uL 11.7(H) 12.2(H) 10.6(H)  RBC 4.22 - 5.81 MIL/uL 3.26(L) 3.54(L) 3.26(L)  HGB 13.0 - 17.0 g/dL 9.7(L) 10.4(L) 9.5(L)  HCT 39.0 - 52.0 % 30.8(L) 33.9(L) 31.3(L)  PLT 150 - 400 K/uL 343 360 316  NEUTROABS 1.7 - 7.7 K/uL - - -  LYMPHSABS 0.7 - 4.0 K/uL - - -     Body mass index is 24.55 kg/m.  Orders:  No orders of the defined types were placed in this encounter.  No orders of the defined types were placed in this encounter.    Procedures: No procedures performed  Clinical Data: No additional findings.  ROS:  All other systems negative, except as noted in the HPI. Review of Systems  Objective: Vital Signs:  Ht 6\' 5"  (1.956 m)   Wt 207 lb (93.9 kg)   BMI 24.55 kg/m   Specialty Comments:  No specialty comments available.  PMFS History: Patient Active Problem List   Diagnosis Date Noted  . Pyogenic inflammation of bone (HCC)   . Osteomyelitis of foot, left, acute (HCC) 12/26/2018  . Right knee pain 10/04/2017  . Left hip pain 04/10/2013  . Diabetic peripheral neuropathy associated with type 2 diabetes mellitus (HCC) 11/10/2012  . Marfan's syndrome 08/08/2012  . Healthcare maintenance 08/08/2012  . Atopic dermatitis 02/16/2012  . Overweight (BMI 25.0-29.9) 07/05/2011  . Allergic rhinitis due to allergen 07/03/2011  . Venous insufficiency of leg 04/03/2011  . Blindness 07/23/2010  . DM (diabetes mellitus), type 2 with neurological complications (HCC) 06/13/2010  . Hypertension 06/13/2010  .  Hyperlipidemia 06/13/2010   Past Medical History:  Diagnosis Date  . Allergy   . Blindness - both eyes 1967   Basketball injury  . Diabetes mellitus   . Hernia   . Hyperlipidemia   . Hypertension   . Marfan syndrome     Family History  Problem Relation Age of Onset  . Heart disease Mother   . Breast cancer Sister     Past Surgical History:  Procedure Laterality Date  . AMPUTATION Left 12/28/2018   Procedure: LEFT FOOT 3RD RAY AMPUTATION;  Surgeon: 13/12/2018, MD;  Location: [facility] OR;  Service: Orthopedics;  Laterality: Left;  . APPENDECTOMY    . INGUINAL HERNIA REPAIR     Right 1970s,    Left 2011 by Dr 2012  . INGUINAL HERNIA REPAIR  08/25/10   recurrent left  . PENILE PROSTHESIS IMPLANT  2000s   [facility], Dr 10/26/10  . RETINAL DETACHMENT SURGERY  1970  . Rotator Cuff Repair  2009   Left side, Dr 2010   . ROTATOR CUFF REPAIR  2010-2011?   right side   Social History   Occupational History  . Occupation: 12-05-1981: [employer]  Tobacco Use  . Smoking status: Former Smoker    Quit date: 02/14/1984    Years since quitting: 35.2  . Smokeless tobacco: Never Used  Substance and Sexual Activity  . Alcohol use: No    Comment: Quit in 10/1983  . Drug use: No  . Sexual activity: Not on file

## 2019-05-24 NOTE — Telephone Encounter (Signed)
Just following up.  Was this completed? Jone Baseman, CMA

## 2019-05-25 ENCOUNTER — Other Ambulatory Visit: Payer: Self-pay | Admitting: Family Medicine

## 2019-05-25 ENCOUNTER — Other Ambulatory Visit: Payer: Self-pay | Admitting: Physician Assistant

## 2019-05-29 NOTE — Telephone Encounter (Signed)
Perfect. Thanks.

## 2019-05-31 ENCOUNTER — Ambulatory Visit: Payer: Medicare Other | Attending: Internal Medicine

## 2019-05-31 DIAGNOSIS — Z23 Encounter for immunization: Secondary | ICD-10-CM

## 2019-05-31 NOTE — Progress Notes (Signed)
   Covid-19 Vaccination Clinic  Name:  Chad Hall    MRN: [id number] DOB: December 23, 1950  05/31/2019  [name] was observed post Covid-19 immunization for 15 minutes without incident. He was provided with Vaccine Information Sheet and instruction to access the V-Safe system.   [name] was instructed to call 911 with any severe reactions post vaccine: Marland Kitchen Difficulty breathing  . Swelling of face and throat  . A fast heartbeat  . A bad rash all over body  . Dizziness and weakness   Immunizations Administered    Name Date Dose VIS Date Route   Pfizer COVID-19 Vaccine 05/31/2019  8:12 AM 0.3 mL 01/27/2019 Intramuscular   Manufacturer: [employer], [employer]   Lot: [id number]   NDC: [address]-0

## 2019-06-09 ENCOUNTER — Other Ambulatory Visit: Payer: Self-pay | Admitting: Physician Assistant

## 2019-06-14 ENCOUNTER — Other Ambulatory Visit: Payer: Self-pay | Admitting: Family Medicine

## 2019-06-24 ENCOUNTER — Other Ambulatory Visit: Payer: Self-pay | Admitting: Physician Assistant

## 2019-06-24 ENCOUNTER — Other Ambulatory Visit: Payer: Self-pay | Admitting: Family Medicine

## 2019-06-29 ENCOUNTER — Other Ambulatory Visit: Payer: Self-pay

## 2019-06-29 MED ORDER — GLIPIZIDE 10 MG PO TABS: 10.0000 mg | ORAL_TABLET | Freq: Every day | ORAL | 3 refills | Status: DC

## 2019-07-10 ENCOUNTER — Encounter: Payer: Self-pay | Admitting: Orthopedic Surgery

## 2019-07-10 ENCOUNTER — Ambulatory Visit (INDEPENDENT_AMBULATORY_CARE_PROVIDER_SITE_OTHER): Payer: Medicare Other | Admitting: Orthopedic Surgery

## 2019-07-10 ENCOUNTER — Other Ambulatory Visit: Payer: Self-pay | Admitting: Physician Assistant

## 2019-07-10 ENCOUNTER — Other Ambulatory Visit: Payer: Self-pay

## 2019-07-10 ENCOUNTER — Other Ambulatory Visit: Payer: Self-pay | Admitting: Cardiothoracic Surgery

## 2019-07-10 VITALS — Ht 77.0 in | Wt 207.0 lb

## 2019-07-10 DIAGNOSIS — T8131XS Disruption of external operation (surgical) wound, not elsewhere classified, sequela: Secondary | ICD-10-CM

## 2019-07-10 DIAGNOSIS — Z89422 Acquired absence of other left toe(s): Secondary | ICD-10-CM | POA: Diagnosis not present

## 2019-07-10 DIAGNOSIS — I712 Thoracic aortic aneurysm, without rupture, unspecified: Secondary | ICD-10-CM

## 2019-07-10 DIAGNOSIS — I872 Venous insufficiency (chronic) (peripheral): Secondary | ICD-10-CM | POA: Diagnosis not present

## 2019-07-10 MED ORDER — PENTOXIFYLLINE ER 400 MG PO TBCR: EXTENDED_RELEASE_TABLET | ORAL | 0 refills | Status: DC

## 2019-07-10 NOTE — Progress Notes (Signed)
Office Visit Note   Patient: Chad Hall           Date of Birth: Aug 08, 1950           MRN: [id number] Visit Date: 07/10/2019              Requested by: Nuala Alpha, DO [address],  [place] [address] PCP: Nuala Alpha, DO  Chief Complaint  Patient presents with  . Left Foot - Follow-up    Hx 3rd ray amputation       HPI: Patient is a 69 year old gentleman who presents over 5 months out from left foot third ray amputation he is currently having home health nurse visits for wet-to-dry dressing changes twice a week.  Patient is still using the nitroglycerin patch.  Assessment & Plan: Visit Diagnoses:  1. Hx of amputation of lesser toe, left (HCC)   2. Venous insufficiency of left lower extremity   3. Dehiscence of operative wound, sequela     Plan: Patient was given orders for home health nursing to do cleansing with soap and water twice a week dry the wound and foot and apply a silver alginate dressing to the wound.  Continue the nitroglycerin patch.  Follow-Up Instructions: Return in about 3 weeks (around 07/31/2019).   Ortho Exam  Patient is alert, oriented, no adenopathy, well-dressed, normal affect, normal respiratory effort. Examination patient has maceration across the forefoot the wound has healthy granulation tissue with approximately 25% fibrinous exudative tissue.  There is venous insufficiency with swelling he is currently has a nitroglycerin patch in place.  There is no odor no drainage no pain to palpation.  Imaging: No results found. No images are attached to the encounter.  Labs: Lab Results  Component Value Date   HGBA1C 7.0 05/04/2019   HGBA1C 6.4 (H) 12/26/2018   HGBA1C 6.5 09/26/2018   REPTSTATUS 12/29/2018 FINAL 12/26/2018   GRAMSTAIN  12/26/2018    RARE WBC PRESENT, PREDOMINANTLY PMN MODERATE GRAM POSITIVE COCCI IN PAIRS MODERATE GRAM NEGATIVE RODS Performed at [facility], [address], [place],  [place] [address]    CULT  12/26/2018    MODERATE ESCHERICHIA COLI MODERATE ALCALIGENES FAECALIS FEW STREPTOCOCCUS MITIS/ORALIS    LABORGA ESCHERICHIA COLI 12/26/2018   LABORGA ALCALIGENES FAECALIS 12/26/2018   LABORGA STREPTOCOCCUS MITIS/ORALIS 12/26/2018     Lab Results  Component Value Date   ALBUMIN 2.2 (L) 12/27/2018   ALBUMIN 2.9 (L) 12/25/2018   ALBUMIN 4.3 01/06/2017    No results found for: MG No results found for: VD25OH  No results found for: PREALBUMIN CBC EXTENDED Latest Ref Rng & Units 12/31/2018 12/30/2018 12/29/2018  WBC 4.0 - 10.5 K/uL 11.7(H) 12.2(H) 10.6(H)  RBC 4.22 - 5.81 MIL/uL 3.26(L) 3.54(L) 3.26(L)  HGB 13.0 - 17.0 g/dL 9.7(L) 10.4(L) 9.5(L)  HCT 39.0 - 52.0 % 30.8(L) 33.9(L) 31.3(L)  PLT 150 - 400 K/uL 343 360 316  NEUTROABS 1.7 - 7.7 K/uL - - -  LYMPHSABS 0.7 - 4.0 K/uL - - -     Body mass index is 24.55 kg/m.  Orders:  No orders of the defined types were placed in this encounter.  No orders of the defined types were placed in this encounter.    Procedures: No procedures performed  Clinical Data: No additional findings.  ROS:  All other systems negative, except as noted in the HPI. Review of Systems  Objective: Vital Signs: Ht 6\' 5"  (1.956 m)   Wt 207 lb (93.9 kg)  BMI 24.55 kg/m   Specialty Comments:  No specialty comments available.  PMFS History: Patient Active Problem List   Diagnosis Date Noted  . Pyogenic inflammation of bone (HCC)   . Osteomyelitis of foot, left, acute (HCC) 12/26/2018  . Right knee pain 10/04/2017  . Left hip pain 04/10/2013  . Diabetic peripheral neuropathy associated with type 2 diabetes mellitus (HCC) 11/10/2012  . Marfan's syndrome 08/08/2012  . Healthcare maintenance 08/08/2012  . Atopic dermatitis 02/16/2012  . Overweight (BMI 25.0-29.9) 07/05/2011  . Allergic rhinitis due to allergen 07/03/2011  . Venous insufficiency of leg 04/03/2011  . Blindness 07/23/2010  . DM (diabetes mellitus),  type 2 with neurological complications (HCC) 06/13/2010  . Hypertension 06/13/2010  . Hyperlipidemia 06/13/2010   Past Medical History:  Diagnosis Date  . Allergy   . Blindness - both eyes 1967   Basketball injury  . Diabetes mellitus   . Hernia   . Hyperlipidemia   . Hypertension   . Marfan syndrome     Family History  Problem Relation Age of Onset  . Heart disease Mother   . Breast cancer Sister     Past Surgical History:  Procedure Laterality Date  . AMPUTATION Left 12/28/2018   Procedure: LEFT FOOT 3RD RAY AMPUTATION;  Surgeon: Nadara Mustard, MD;  Location: [facility] OR;  Service: Orthopedics;  Laterality: Left;  . APPENDECTOMY    . INGUINAL HERNIA REPAIR     Right 1970s,    Left 2011 by Dr Andrey Campanile  . INGUINAL HERNIA REPAIR  08/25/10   recurrent left  . PENILE PROSTHESIS IMPLANT  2000s   [facility], Dr Brunilda Payor  . RETINAL DETACHMENT SURGERY  1970  . Rotator Cuff Repair  2009   Left side, Dr Elita Quick   . ROTATOR CUFF REPAIR  2010-2011?   right side   Social History   Occupational History  . 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 Smoking status: Former Smoker    Quit date: 02/14/1984    Years since quitting: 35.4  . Smokeless tobacco: Never Used  Substance and Sexual Activity  . Alcohol use: No    Comment: Quit in 10/1983  . Drug use: No  . Sexual activity: Not on file

## 2019-07-11 ENCOUNTER — Other Ambulatory Visit: Payer: Self-pay | Admitting: Cardiothoracic Surgery

## 2019-07-11 DIAGNOSIS — I7121 Aneurysm of the ascending aorta, without rupture: Secondary | ICD-10-CM

## 2019-07-24 ENCOUNTER — Ambulatory Visit: Payer: Medicare Other | Admitting: Physician Assistant

## 2019-07-26 ENCOUNTER — Other Ambulatory Visit: Payer: Self-pay | Admitting: Physician Assistant

## 2019-07-27 ENCOUNTER — Telehealth: Payer: Self-pay | Admitting: Physician Assistant

## 2019-07-27 NOTE — Telephone Encounter (Signed)
Linda from [employer] called in reference to the patient's Pentoxifylline.  She is needing clarification on the directions for this RX.  Reference#[id number]. CB#[phone].  Thank you

## 2019-07-28 NOTE — Telephone Encounter (Signed)
Patient's pharmacy was called and clarified that order is for Trental Take I tab by mouth TID per day per PA. Order was clarified and understood.

## 2019-07-31 ENCOUNTER — Encounter: Payer: Self-pay | Admitting: Orthopedic Surgery

## 2019-07-31 ENCOUNTER — Other Ambulatory Visit: Payer: Self-pay

## 2019-07-31 ENCOUNTER — Ambulatory Visit (INDEPENDENT_AMBULATORY_CARE_PROVIDER_SITE_OTHER): Payer: Medicare Other | Admitting: Physician Assistant

## 2019-07-31 VITALS — Ht 77.0 in | Wt 207.0 lb

## 2019-07-31 DIAGNOSIS — M86172 Other acute osteomyelitis, left ankle and foot: Secondary | ICD-10-CM

## 2019-07-31 MED ORDER — DOXYCYCLINE HYCLATE 100 MG PO TABS: 100.0000 mg | ORAL_TABLET | Freq: Two times a day (BID) | ORAL | 0 refills | Status: DC

## 2019-07-31 NOTE — Progress Notes (Signed)
Office Visit Note   Patient: Chad Hall           Date of Birth: February 20, 1950           MRN: [id number] Visit Date: 07/31/2019              Requested by: Nuala Alpha, DO [address],  [place] [address] PCP: Nuala Alpha, DO  Chief Complaint  Patient presents with  . Left Foot - Follow-up    Hx 3rd ray amputation       HPI: This is a pleasant gentleman who is now almost 6 months status post left third ray amputation.  He he was overall healed despite a long recovery.  He has now had more maceration and opening of the wound.  He is currently having silver cell dressing changes.  He denies any fever chills he is not painful Assessment & Plan: Visit Diagnoses: No diagnosis found.  Plan: Status post third ray amputation.  I do have some concern that this may not heal and I shared this with him.  He still though would like to go forward with conservative care.  In the wound does not look any worse than it previous exam.  I will place him on doxycycline and he will also take a probiotic follow-up in 2 weeks  Follow-Up Instructions: No follow-ups on file.   Ortho Exam  Patient is alert, oriented, no adenopathy, well-dressed, normal affect, normal respiratory effort. Ray amputation some maceration of the dorsum of the foot.  He does have some fibrous exudative tissue over about 20 to 25% of the wound there is no foul odor or purulent drainage no fluctuance no surrounding cellulitis  Imaging: No results found. No images are attached to the encounter.  Labs: Lab Results  Component Value Date   HGBA1C 7.0 05/04/2019   HGBA1C 6.4 (H) 12/26/2018   HGBA1C 6.5 09/26/2018   REPTSTATUS 12/29/2018 FINAL 12/26/2018   GRAMSTAIN  12/26/2018    RARE WBC PRESENT, PREDOMINANTLY PMN MODERATE GRAM POSITIVE COCCI IN PAIRS MODERATE GRAM NEGATIVE RODS Performed at [facility], [address] [place], [place] [address]    CULT  12/26/2018    MODERATE  ESCHERICHIA COLI MODERATE ALCALIGENES FAECALIS FEW STREPTOCOCCUS MITIS/ORALIS    LABORGA ESCHERICHIA COLI 12/26/2018   LABORGA ALCALIGENES FAECALIS 12/26/2018   LABORGA STREPTOCOCCUS MITIS/ORALIS 12/26/2018     Lab Results  Component Value Date   ALBUMIN 2.2 (L) 12/27/2018   ALBUMIN 2.9 (L) 12/25/2018   ALBUMIN 4.3 01/06/2017    No results found for: MG No results found for: VD25OH  No results found for: PREALBUMIN CBC EXTENDED Latest Ref Rng & Units 12/31/2018 12/30/2018 12/29/2018  WBC 4.0 - 10.5 K/uL 11.7(H) 12.2(H) 10.6(H)  RBC 4.22 - 5.81 MIL/uL 3.26(L) 3.54(L) 3.26(L)  HGB 13.0 - 17.0 g/dL 9.7(L) 10.4(L) 9.5(L)  HCT 39 - 52 % 30.8(L) 33.9(L) 31.3(L)  PLT 150 - 400 K/uL 343 360 316  NEUTROABS 1.7 - 7.7 K/uL - - -  LYMPHSABS 0.7 - 4.0 K/uL - - -     Body mass index is 24.55 kg/m.  Orders:  No orders of the defined types were placed in this encounter.  Meds ordered this encounter  Medications  . doxycycline (VIBRA-TABS) 100 MG tablet    Sig: Take 1 tablet (100 mg total) by mouth 2 (two) times daily.    Dispense:  60 tablet    Refill:  0     Procedures: No  procedures performed  Clinical Data: No additional findings.  ROS:  All other systems negative, except as noted in the HPI. Review of Systems  Objective: Vital Signs: Ht 6\' 5"  (1.956 m)   Wt 207 lb (93.9 kg)   BMI 24.55 kg/m   Specialty Comments:  No specialty comments available.  PMFS History: Patient Active Problem List   Diagnosis Date Noted  . Pyogenic inflammation of bone (HCC)   . Osteomyelitis of foot, left, acute (HCC) 12/26/2018  . Right knee pain 10/04/2017  . Left hip pain 04/10/2013  . Diabetic peripheral neuropathy associated with type 2 diabetes mellitus (HCC) 11/10/2012  . Marfan's syndrome 08/08/2012  . Healthcare maintenance 08/08/2012  . Atopic dermatitis 02/16/2012  . Overweight (BMI 25.0-29.9) 07/05/2011  . Allergic rhinitis due to allergen 07/03/2011  . Venous  insufficiency of leg 04/03/2011  . Blindness 07/23/2010  . DM (diabetes mellitus), type 2 with neurological complications (HCC) 06/13/2010  . Hypertension 06/13/2010  . Hyperlipidemia 06/13/2010   Past Medical History:  Diagnosis Date  . Allergy   . Blindness - both eyes 1967   Basketball injury  . Diabetes mellitus   . Hernia   . Hyperlipidemia   . Hypertension   . Marfan syndrome     Family History  Problem Relation Age of Onset  . Heart disease Mother   . Breast cancer Sister     Past Surgical History:  Procedure Laterality Date  . AMPUTATION Left 12/28/2018   Procedure: LEFT FOOT 3RD RAY AMPUTATION;  Surgeon: 13/12/2018, MD;  Location: [facility] OR;  Service: Orthopedics;  Laterality: Left;  . APPENDECTOMY    . INGUINAL HERNIA REPAIR     Right 1970s,    Left 2011 by Dr 2012  . INGUINAL HERNIA REPAIR  08/25/10   recurrent left  . PENILE PROSTHESIS IMPLANT  2000s   [facility], Dr 10/26/10  . RETINAL DETACHMENT SURGERY  1970  . Rotator Cuff Repair  2009   Left side, Dr 2010   . ROTATOR CUFF REPAIR  2010-2011?   right side   Social History   Occupational History  . Occupation: 12-05-1981: [employer]  Tobacco Use  . Smoking status: Former Smoker    Quit date: 02/14/1984    Years since quitting: 35.4  . Smokeless tobacco: Never Used  Substance and Sexual Activity  . Alcohol use: No    Comment: Quit in 10/1983  . Drug use: No  . Sexual activity: Not on file

## 2019-08-14 ENCOUNTER — Ambulatory Visit: Payer: Medicare Other | Admitting: Orthopedic Surgery

## 2019-08-14 ENCOUNTER — Other Ambulatory Visit: Payer: Medicare Other

## 2019-08-14 ENCOUNTER — Encounter: Payer: Self-pay | Admitting: Orthopedic Surgery

## 2019-08-14 ENCOUNTER — Ambulatory Visit: Payer: Medicare Other | Admitting: Cardiothoracic Surgery

## 2019-08-14 VITALS — Ht 77.0 in | Wt 207.0 lb

## 2019-08-14 DIAGNOSIS — I872 Venous insufficiency (chronic) (peripheral): Secondary | ICD-10-CM

## 2019-08-14 DIAGNOSIS — T8131XS Disruption of external operation (surgical) wound, not elsewhere classified, sequela: Secondary | ICD-10-CM

## 2019-08-14 DIAGNOSIS — Z89422 Acquired absence of other left toe(s): Secondary | ICD-10-CM

## 2019-08-15 ENCOUNTER — Other Ambulatory Visit: Payer: Self-pay | Admitting: Family Medicine

## 2019-08-15 ENCOUNTER — Other Ambulatory Visit: Payer: Self-pay | Admitting: Physician Assistant

## 2019-08-16 ENCOUNTER — Encounter: Payer: Self-pay | Admitting: Orthopedic Surgery

## 2019-08-16 NOTE — Progress Notes (Signed)
Office Visit Note   Patient: Chad Hall           Date of Birth: [birth year]           MRN: [id number] Visit Date: 08/14/2019              Requested by: Arlyce Harman, DO [address] [place],  [place] [address] PCP: Arlyce Harman, DO  Chief Complaint  Patient presents with   Left Foot - Follow-up    12/28/2018 Left 3rd ray amputation      HPI: Patient is a 69 year old gentleman who presents follow-up status post left foot third ray amputation.  Assessment & Plan: Visit Diagnoses:  1. Hx of amputation of lesser toe, left (HCC)   2. Dehiscence of operative wound, sequela   3. Venous insufficiency of left lower extremity     Plan: Patient continues to show slow steady healing.  Iodosorb and a 4 x 4 dressing was applied he will continue with Dial soap cleansing.  Follow-Up Instructions: Return in about 3 weeks (around 09/04/2019).   Ortho Exam  Patient is alert, oriented, no adenopathy, well-dressed, normal affect, normal respiratory effort. Examination dorsally patient has some fibrinous exudative tissue 2 x 4 cm plantarly he has a Wagner grade 1 ulcer about 5 mm in diameter this was debrided.  There is no cellulitis no drainage no odor no exposed bone or tendon.  Imaging: No results found. No images are attached to the encounter.  Labs: Lab Results  Component Value Date   HGBA1C 7.0 05/04/2019   HGBA1C 6.4 (H) 12/26/2018   HGBA1C 6.5 09/26/2018   REPTSTATUS 12/29/2018 FINAL 12/26/2018   GRAMSTAIN  12/26/2018    RARE WBC PRESENT, PREDOMINANTLY PMN MODERATE GRAM POSITIVE COCCI IN PAIRS MODERATE GRAM NEGATIVE RODS Performed at [facility], [address] [place], [place] [address]    CULT  12/26/2018    MODERATE ESCHERICHIA COLI MODERATE ALCALIGENES FAECALIS FEW STREPTOCOCCUS MITIS/ORALIS    LABORGA ESCHERICHIA COLI 12/26/2018   LABORGA ALCALIGENES FAECALIS 12/26/2018   LABORGA STREPTOCOCCUS MITIS/ORALIS 12/26/2018     Lab  Results  Component Value Date   ALBUMIN 2.2 (L) 12/27/2018   ALBUMIN 2.9 (L) 12/25/2018   ALBUMIN 4.3 01/06/2017    No results found for: MG No results found for: VD25OH  No results found for: PREALBUMIN CBC EXTENDED Latest Ref Rng & Units 12/31/2018 12/30/2018 12/29/2018  WBC 4.0 - 10.5 K/uL 11.7(H) 12.2(H) 10.6(H)  RBC 4.22 - 5.81 MIL/uL 3.26(L) 3.54(L) 3.26(L)  HGB 13.0 - 17.0 g/dL 8.1(E) 10.4(L) 9.5(L)  HCT 39 - 52 % 30.8(L) 33.9(L) 31.3(L)  PLT 150 - 400 K/uL 343 360 316  NEUTROABS 1.7 - 7.7 K/uL - - -  LYMPHSABS 0.7 - 4.0 K/uL - - -     Body mass index is 24.55 kg/m.  Orders:  No orders of the defined types were placed in this encounter.  No orders of the defined types were placed in this encounter.    Procedures: No procedures performed  Clinical Data: No additional findings.  ROS:  All other systems negative, except as noted in the HPI. Review of Systems  Objective: Vital Signs: Ht 6\' 5"  (1.956 m)    Wt 207 lb (93.9 kg)    BMI 24.55 kg/m   Specialty Comments:  No specialty comments available.  PMFS History: Patient Active Problem List   Diagnosis Date Noted   Pyogenic inflammation of bone (HCC)    Osteomyelitis of foot, left,  acute (HCC) 12/26/2018   Right knee pain 10/04/2017   Left hip pain 04/10/2013   Diabetic peripheral neuropathy associated with type 2 diabetes mellitus (HCC) 11/10/2012   Marfan's syndrome 08/08/2012   Healthcare maintenance 08/08/2012   Atopic dermatitis 02/16/2012   Overweight (BMI 25.0-29.9) 07/05/2011   Allergic rhinitis due to allergen 07/03/2011   Venous insufficiency of leg 04/03/2011   Blindness 07/23/2010   DM (diabetes mellitus), type 2 with neurological complications (HCC) 06/13/2010   Hypertension 06/13/2010   Hyperlipidemia 06/13/2010   Past Medical History:  Diagnosis Date   Allergy    Blindness - both eyes 1967   Basketball injury   Diabetes mellitus    Hernia    Hyperlipidemia     Hypertension    Marfan syndrome     Family History  Problem Relation Age of Onset   Heart disease Mother    Breast cancer Sister     Past Surgical History:  Procedure Laterality Date   AMPUTATION Left 12/28/2018   Procedure: LEFT FOOT 3RD RAY AMPUTATION;  Surgeon: Nadara Mustard, MD;  Location: [facility] OR;  Service: Orthopedics;  Laterality: Left;   APPENDECTOMY     INGUINAL HERNIA REPAIR     Right 1970s,    Left 2011 by Dr Job Founds HERNIA REPAIR  08/25/10   recurrent left   PENILE PROSTHESIS IMPLANT  2000s   [facility], Dr Brunilda Payor   RETINAL DETACHMENT SURGERY  1970   Rotator Cuff Repair  2009   Left side, Dr Elita Quick    ROTATOR CUFF REPAIR  2010-2011?   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

## 2019-08-22 ENCOUNTER — Emergency Department (HOSPITAL_COMMUNITY)
Admission: EM | Admit: 2019-08-22 | Discharge: 2019-08-22 | Disposition: A | Discharge status: Home / Self Care | Payer: Medicare Other | Attending: Emergency Medicine | Admitting: Emergency Medicine

## 2019-08-22 ENCOUNTER — Emergency Department (HOSPITAL_COMMUNITY): Payer: Medicare Other

## 2019-08-22 ENCOUNTER — Ambulatory Visit: Payer: Medicare Other | Admitting: Family

## 2019-08-22 DIAGNOSIS — Z79899 Other long term (current) drug therapy: Secondary | ICD-10-CM | POA: Diagnosis not present

## 2019-08-22 DIAGNOSIS — E1140 Type 2 diabetes mellitus with diabetic neuropathy, unspecified: Secondary | ICD-10-CM | POA: Diagnosis not present

## 2019-08-22 DIAGNOSIS — B871 Wound myiasis: Secondary | ICD-10-CM | POA: Diagnosis present

## 2019-08-22 DIAGNOSIS — Z87891 Personal history of nicotine dependence: Secondary | ICD-10-CM | POA: Insufficient documentation

## 2019-08-22 DIAGNOSIS — E118 Type 2 diabetes mellitus with unspecified complications: Secondary | ICD-10-CM | POA: Insufficient documentation

## 2019-08-22 DIAGNOSIS — I10 Essential (primary) hypertension: Secondary | ICD-10-CM | POA: Diagnosis not present

## 2019-08-22 LAB — CBC WITH DIFFERENTIAL/PLATELET
Abs Immature Granulocytes: 0.03 10*3/uL (ref 0.00–0.07)
Basophils Absolute: 0.1 10*3/uL (ref 0.0–0.1)
Basophils Relative: 1 %
Eosinophils Absolute: 0.2 10*3/uL (ref 0.0–0.5)
Eosinophils Relative: 3 %
HCT: 38.6 % — ABNORMAL LOW (ref 39.0–52.0)
Hemoglobin: 11.8 g/dL — ABNORMAL LOW (ref 13.0–17.0)
Immature Granulocytes: 0 %
Lymphocytes Relative: 15 %
Lymphs Abs: 1.1 10*3/uL (ref 0.7–4.0)
MCH: 30.2 pg (ref 26.0–34.0)
MCHC: 30.6 g/dL (ref 30.0–36.0)
MCV: 98.7 fL (ref 80.0–100.0)
Monocytes Absolute: 1.1 10*3/uL — ABNORMAL HIGH (ref 0.1–1.0)
Monocytes Relative: 14 %
Neutro Abs: 4.9 10*3/uL (ref 1.7–7.7)
Neutrophils Relative %: 67 %
Platelets: 266 10*3/uL (ref 150–400)
RBC: 3.91 MIL/uL — ABNORMAL LOW (ref 4.22–5.81)
RDW: 12.4 % (ref 11.5–15.5)
WBC: 7.4 10*3/uL (ref 4.0–10.5)
nRBC: 0 % (ref 0.0–0.2)

## 2019-08-22 LAB — CBG MONITORING, ED: Glucose-Capillary: 103 mg/dL — ABNORMAL HIGH (ref 70–99)

## 2019-08-22 LAB — BASIC METABOLIC PANEL
Anion gap: 8 (ref 5–15)
BUN: 28 mg/dL — ABNORMAL HIGH (ref 8–23)
CO2: 24 mmol/L (ref 22–32)
Calcium: 8.8 mg/dL — ABNORMAL LOW (ref 8.9–10.3)
Chloride: 111 mmol/L (ref 98–111)
Creatinine, Ser: 1.42 mg/dL — ABNORMAL HIGH (ref 0.61–1.24)
GFR calc Af Amer: 58 mL/min — ABNORMAL LOW (ref >60)
GFR calc non Af Amer: 50 mL/min — ABNORMAL LOW (ref >60)
Glucose, Bld: 117 mg/dL — ABNORMAL HIGH (ref 70–99)
Potassium: 4.7 mmol/L (ref 3.5–5.1)
Sodium: 143 mmol/L (ref 135–145)

## 2019-08-22 IMAGING — DX DG FOOT COMPLETE 3+V*L*
3 series · 3 of 3 positions shown · non-contrast
Comparison: [DATE]

CLINICAL DATA: Diabetic wound.

EXAM:
LEFT FOOT - COMPLETE 3+ VIEW

[foot ap]
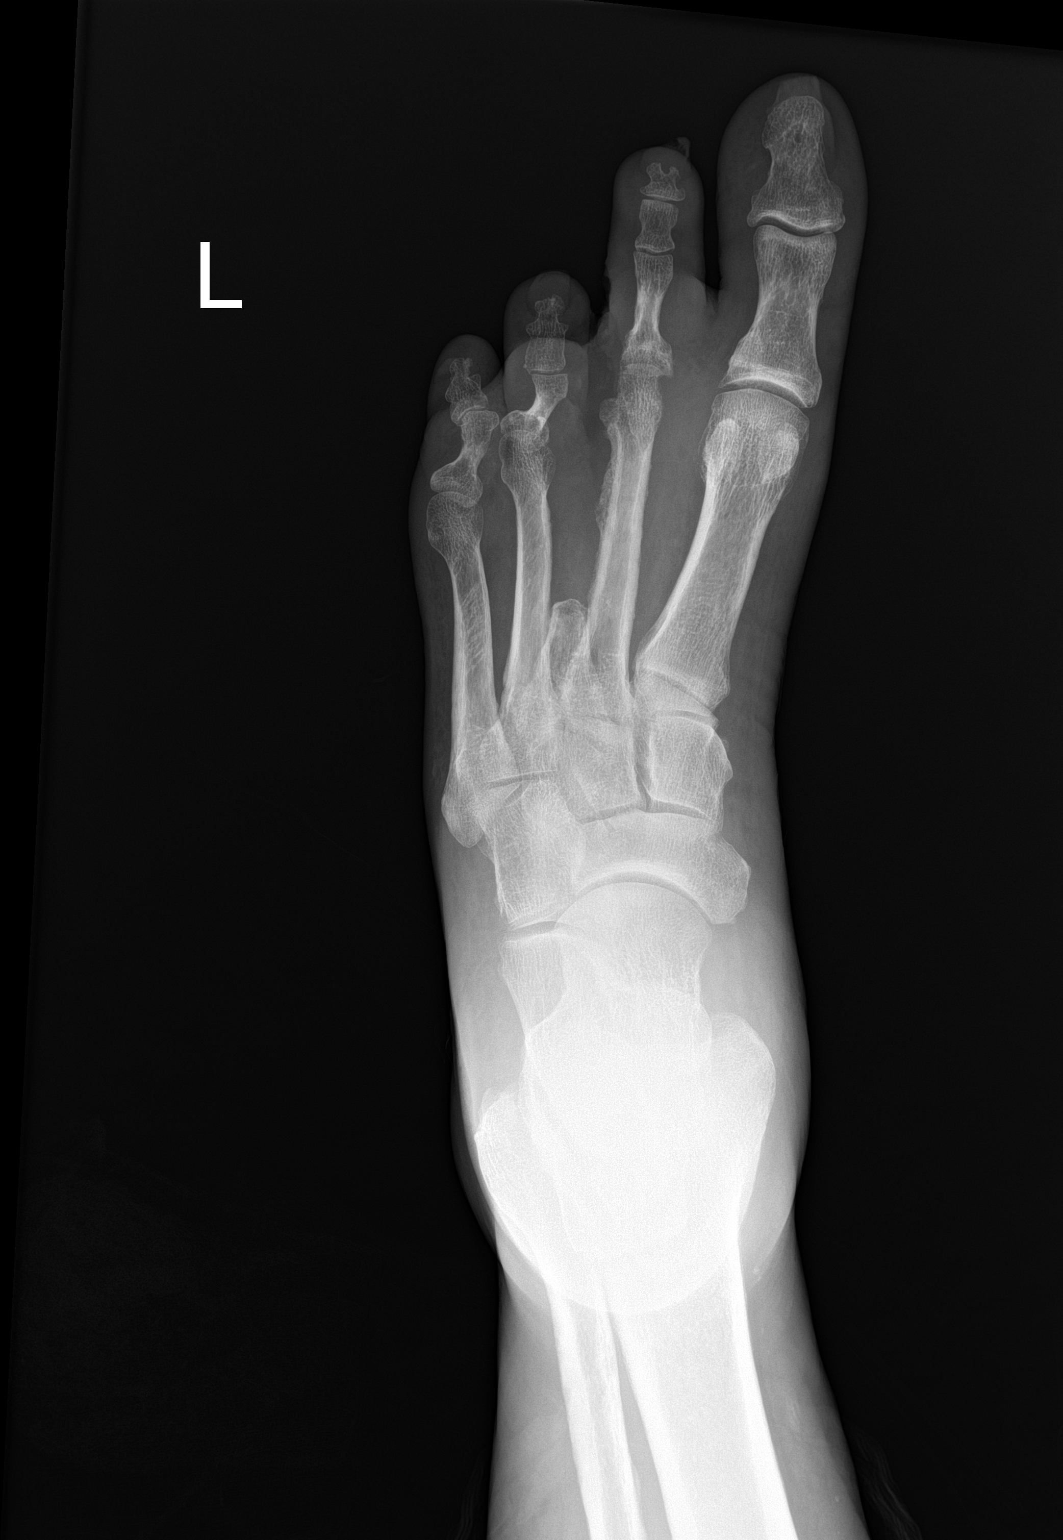

[foot obl]
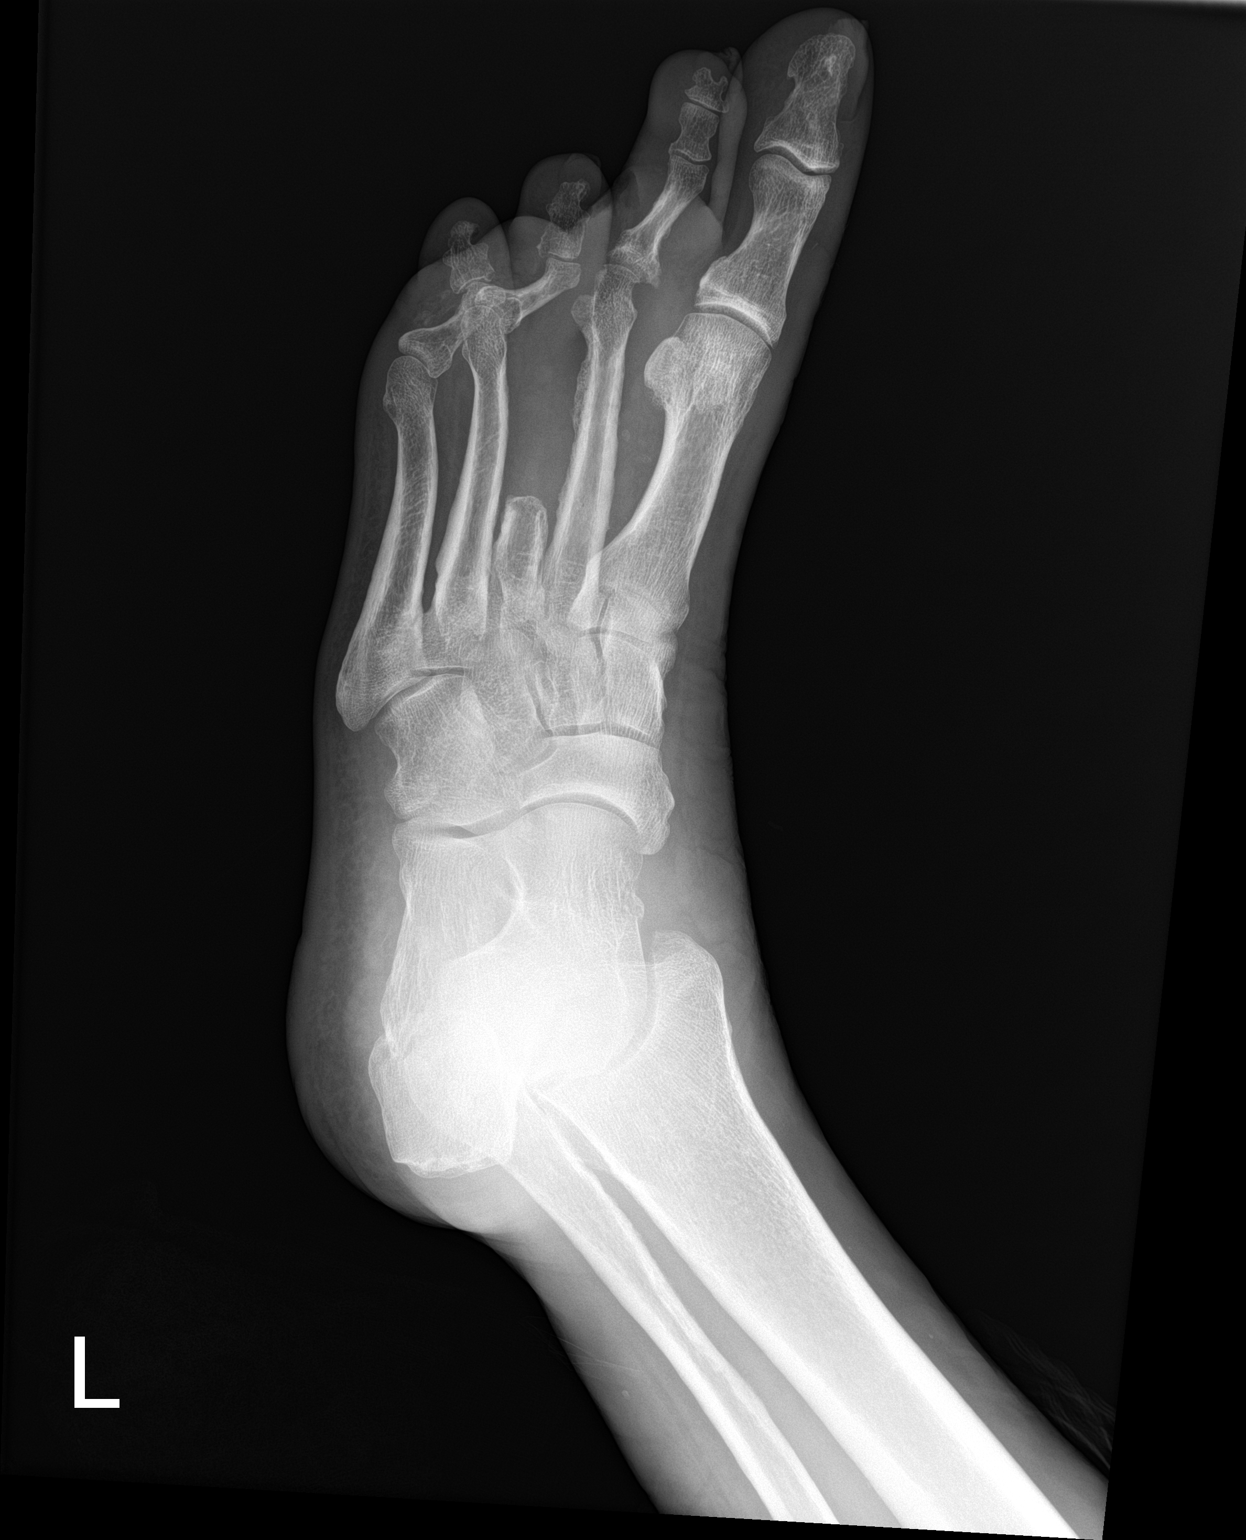

[foot lat]
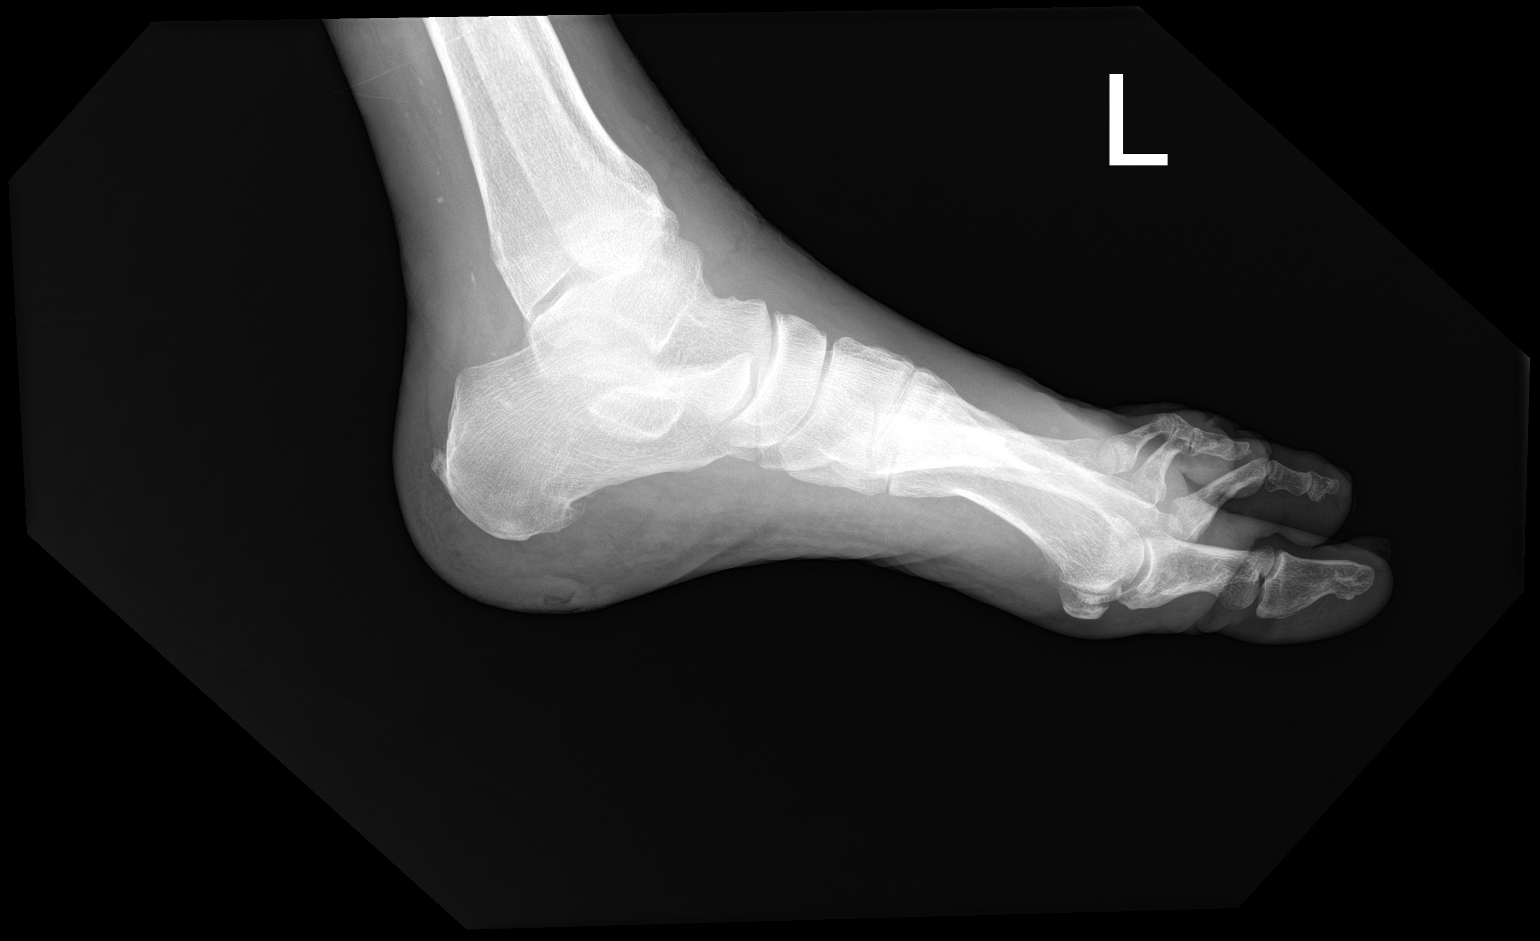

[3 of 3 positions shown; findings below may reference images not displayed]

FINDINGS: Since the prior study there is been interval transmetatarsal
amputation of the third left toe. There is no evidence of fracture
or dislocation. There is no evidence of arthropathy or other focal
bone abnormality. An ill-defined, superficial 1.4 cm soft tissue
defect is seen in between the second and fourth left toes. No soft
tissue air is seen. Moderate severity soft tissue swelling is seen
along the left heel.
IMPRESSION: 1. Interval transmetatarsal amputation of the third left toe.
2. Superficial soft tissue defect in between the second and fourth
left toes.
3. No evidence of acute osteomyelitis. MRI correlation is
recommended if this is of clinical concern.

## 2019-08-22 NOTE — Discharge Instructions (Addendum)
Please go to Dr. Audrie Lia office tomorrow at 2pm for close follow up and evaluation of your foot.

## 2019-08-22 NOTE — ED Notes (Signed)
ED Provider at bedside. 

## 2019-08-22 NOTE — ED Provider Notes (Signed)
[facility]-EMERGENCY DEPT Provider Note   CSN: [id number] Arrival date & time: 08/22/19  1358     History Chief Complaint  Patient presents with   Foot Injury    Chad Hall is a 69 y.o. male with past medical history of Marfan syndrome, diabetes, hypertension, bilateral blindness, presenting to the emergency department with complaint of maggots in his wound.  He recently had amputation of the left third toe in November by Dr. Lajoyce Corners.  He states he was having problems with his wound healing.  He has been receiving wound care at home by nursing, twice a week.  He states the nurse today noticed maggots in his wound and sent him to the ED for further evaluation.  He states he has not have any worsening symptoms, no fevers or chills, his sugars have been running anywhere from the 70s to 90s- fasting.   Per chart review, patient seen on 08/14/2019 by Dr. Lajoyce Corners in the clinic.  He was instructed return in about 3 weeks.  Exam noted "no cellulitis, drainage, odor or exposed bone or tendon"  The history is provided by the patient and medical records.       Past Medical History:  Diagnosis Date   Allergy    Blindness - both eyes 1967   Basketball injury   Diabetes mellitus    Hernia    Hyperlipidemia    Hypertension    Marfan syndrome     Patient Active Problem List   Diagnosis Date Noted   Pyogenic inflammation of bone (HCC)    Osteomyelitis of foot, left, acute (HCC) 12/26/2018   Right knee pain 10/04/2017   Left hip pain 04/10/2013   Diabetic peripheral neuropathy associated with type 2 diabetes mellitus (HCC) 11/10/2012   Marfan's syndrome 08/08/2012   Healthcare maintenance 08/08/2012   Atopic dermatitis 02/16/2012   Overweight (BMI 25.0-29.9) 07/05/2011   Allergic rhinitis due to allergen 07/03/2011   Venous insufficiency of leg 04/03/2011   Blindness 07/23/2010   DM (diabetes mellitus), type 2 with neurological complications  (HCC) 06/13/2010   Hypertension 06/13/2010   Hyperlipidemia 06/13/2010    Past Surgical History:  Procedure Laterality Date   AMPUTATION Left 12/28/2018   Procedure: LEFT FOOT 3RD RAY AMPUTATION;  Surgeon: Nadara Mustard, MD;  Location: [facility] OR;  Service: Orthopedics;  Laterality: Left;   APPENDECTOMY     INGUINAL HERNIA REPAIR     Right 1970s,    Left 2011 by Dr Job Founds HERNIA REPAIR  08/25/10   recurrent left   PENILE PROSTHESIS IMPLANT  2000s   [facility], Dr Brunilda Payor   RETINAL DETACHMENT SURGERY  1970   Rotator Cuff Repair  2009   Left side, Dr Elita Quick    ROTATOR CUFF REPAIR  2010-2011?   right side       Family History  Problem Relation Age of Onset   Heart disease Mother    Breast cancer Sister     Social History   Tobacco Use   Smoking status: Former Smoker    Quit date: 02/14/1984    Years since quitting: 35.5   Smokeless tobacco: Never Used  Substance Use Topics   Alcohol use: No    Comment: Quit in 10/1983   Drug use: No    Home Medications Prior to Admission medications   Medication Sig Start Date End Date Taking? Authorizing Provider  acetaminophen (TYLENOL) 325 MG tablet Take 2 tablets (650 mg total) by mouth every 6 (  six) hours as needed for mild pain (or Fever >/= 101). 01/01/19   Dollene Cleveland, DO  atorvastatin (LIPITOR) 80 MG tablet TAKE 1 TABLET BY MOUTH  DAILY AT 6 PM 01/20/19   Lockamy, Timothy, DO  augmented betamethasone dipropionate (DIPROLENE-AF) 0.05 % cream APPLY TO AFFECTED AREA(S)  TOPICALLY TWICE DAILY AS  DIRECTED 08/15/19   Arlyce Harman, DO  cholecalciferol (VITAMIN D3) 25 MCG (1000 UT) tablet Take 1,000 Units by mouth at bedtime.    [provider]  docusate sodium (COLACE) 100 MG capsule Take 1 capsule (100 mg total) by mouth daily as needed for mild constipation. 01/01/19   Dollene Cleveland, DO  doxycycline (VIBRA-TABS) 100 MG tablet TAKE 1 TABLET BY MOUTH  TWICE DAILY 08/15/19   Persons, [place], PA  enalapril (VASOTEC) 20 MG tablet TAKE 1 TABLET BY MOUTH  DAILY 05/27/19   Lockamy, Timothy, DO  glipiZIDE (GLUCOTROL) 10 MG tablet Take 1 tablet (10 mg total) by mouth at bedtime. 06/29/19   Lockamy, Timothy, DO  JARDIANCE 10 MG TABS tablet TAKE 1 TABLET BY MOUTH  DAILY Patient taking differently: Take 10 mg by mouth daily.  09/19/18   Arlyce Harman, DO  metoprolol succinate (TOPROL-XL) 25 MG 24 hr tablet Take 0.5 tablets (12.5 mg total) by mouth every evening. 06/26/19   Arlyce Harman, DO  nitroGLYCERIN (NITRODUR - DOSED IN MG/24 HR) 0.2 mg/hr patch Place 1 patch (0.2 mg total) onto the skin daily. Apply to skin daily to different area of ankle and foot. Remove previous patch 02/23/19   Persons, [place], PA  ondansetron (ZOFRAN) 4 MG tablet Take 1 tablet (4 mg total) by mouth every 8 (eight) hours as needed for nausea or vomiting. 01/01/19   Dollene Cleveland, DO  oxyCODONE (OXY IR/ROXICODONE) 5 MG immediate release tablet Take 1 tablet (5 mg total) by mouth every 6 (six) hours as needed for severe pain (pain score 6-10). 01/01/19   Peggyann Shoals C, DO  pentoxifylline (TRENTAL) 400 MG CR tablet TAKE 1 TABLET BY MOUTH 3  TIMES DAILY WITH MEALS 08/15/19   Persons, [place], PA  PRODIGY NO CODING BLOOD GLUC test strip USE TO CHECK BLOOD SUGAR  THREE TIMES DAILY OR AS  DIRECTED 08/15/19   Lockamy, Marcial Pacas, DO  Prodigy Twist Top Lancets 28G MISC CHECK BLOOD SUGAR 3 TIMES  DAILY 06/15/19   Lockamy, Timothy, DO  TRADJENTA 5 MG TABS tablet TAKE 1 TABLET BY MOUTH  DAILY Patient taking differently: Take 5 mg by mouth at bedtime.  09/19/18   Arlyce Harman, DO  triamcinolone cream (KENALOG) 0.1 % APPLY TO AFFECTED AREA(S)  TOPICALLY TWICE DAILY 05/27/19   Arlyce Harman, DO  atorvastatin (LIPITOR) 80 MG tablet Take 1 tablet (80 mg total) by mouth daily at 6 PM. 01/01/19   Dollene Cleveland, DO  cephALEXin (KEFLEX) 500 MG capsule Take 1 capsule (500 mg total) by mouth 3 (three) times daily for 16  days. 01/15/19   Peggyann Shoals C, DO  enalapril (VASOTEC) 20 MG tablet TAKE 1 TABLET BY MOUTH  DAILY Patient taking differently: Take 20 mg by mouth at bedtime.  07/04/18   Arlyce Harman, DO  triamcinolone cream (KENALOG) 0.1 % APPLY TO AFFECTED AREA(S)  TOPICALLY TWICE DAILY 02/13/19   Arlyce Harman, DO    Allergies    Patient has no known allergies.  Review of Systems   Review of Systems  All other systems reviewed and are negative.   Physical Exam  Updated Vital Signs BP (!) 159/83    Pulse 64    Temp 98.2 F (36.8 C) (Oral)    Resp 19    SpO2 99%   Physical Exam Vitals and nursing note reviewed.  Constitutional:      General: He is not in acute distress.    Appearance: He is well-developed.  HENT:     Head: Normocephalic and atraumatic.  Eyes:     Conjunctiva/sclera: Conjunctivae normal.  Cardiovascular:     Rate and Rhythm: Normal rate and regular rhythm.  Pulmonary:     Effort: Pulmonary effort is normal.     Breath sounds: Normal breath sounds.  Abdominal:     Palpations: Abdomen is soft.  Skin:    General: Skin is warm.     Comments: Left foot with chronic appearing wound between the second and fourth digits.  The third digit is status post amputation.  The wound on the dorsum of the foot has granulation tissue present at the base, with live maggots.  The skin appears moist.  The plantar aspect of the foot has an ulcer at the MTP, maggots are also present on the base of this wound.  No drainage expressed. See images below  Neurological:     Mental Status: He is alert.     Comments: Little sensation to left distal foot  Psychiatric:        Behavior: Behavior normal.           ED Results / Procedures / Treatments   Labs (all labs ordered are listed, but only abnormal results are displayed) Labs Reviewed  CBC WITH DIFFERENTIAL/PLATELET - Abnormal; Notable for the following components:      Result Value   RBC 3.91 (*)    Hemoglobin 11.8 (*)     HCT 38.6 (*)    Monocytes Absolute 1.1 (*)    All other components within normal limits  BASIC METABOLIC PANEL - Abnormal; Notable for the following components:   Glucose, Bld 117 (*)    BUN 28 (*)    Creatinine, Ser 1.42 (*)    Calcium 8.8 (*)    GFR calc non Af Amer 50 (*)    GFR calc Af Amer 58 (*)    All other components within normal limits  CBG MONITORING, ED - Abnormal; Notable for the following components:   Glucose-Capillary 103 (*)    All other components within normal limits    EKG None  Radiology DG Foot Complete Left  Result Date: 08/22/2019 CLINICAL DATA:  Diabetic wound. EXAM: LEFT FOOT - COMPLETE 3+ VIEW COMPARISON:  December 25, 2018 FINDINGS: Since the prior study there is been interval transmetatarsal amputation of the third left toe. There is no evidence of fracture or dislocation. There is no evidence of arthropathy or other focal bone abnormality. An ill-defined, superficial 1.4 cm soft tissue defect is seen in between the second and fourth left toes. No soft tissue air is seen. Moderate severity soft tissue swelling is seen along the left heel. IMPRESSION: 1. Interval transmetatarsal amputation of the third left toe. 2. Superficial soft tissue defect in between the second and fourth left toes. 3. No evidence of acute osteomyelitis. MRI correlation is recommended if this is of clinical concern. Electronically Signed   By: Aram Candelahaddeus  Houston M.D.   On: 08/22/2019 15:34    Procedures Procedures (including critical care time)  Medications Ordered in ED Medications - No data to display  ED Course  I have reviewed  the triage vital signs and the nursing notes.  Pertinent labs & imaging results that were available during my care of the patient were reviewed by me and considered in my medical decision making (see chart for details).  Clinical Course as of Aug 21 1725  Tue Aug 22, 2019  1609 Dr. Cleophas Dunker with [facility] consulted. He reviewed images in the chart.  Recommends close follow up with Dr. Lajoyce Corners, he will contact Dr. Lajoyce Corners for close appointment this week. +/- abx. Appreciate consult.   [JR]    Clinical Course User Index [JR] Louna Rothgeb, [place] N, PA-C   MDM Rules/Calculators/A&P                          Patient with chronic wound to the left foot status post third toe amputation in November by Dr. Lajoyce Corners, presenting from home after normal wound care nurse noted maggots in his wound today.  He has no S systemic symptoms.  His labs today are reassuring with no leukocytosis.  Glucose is 117.  He is afebrile and in no distress.  X-ray without new findings suggestive of osteomyelitis.  Discussed with Dr. Cleophas Dunker, on call for Dr. Lajoyce Corners.  He reviewed images in chart of patient's foot today.  He recommends he is appropriate for outpatient follow-up with Dr. Lajoyce Corners.  Given absence of new drainage or systemic symptoms, do not believe patient will benefit from antibiotic.  Appointment was made over the phone with Dr. Audrie Lia nurse, patient states he is able to attend his appointment tomorrow.  Wet-to-dry dressings applied in the ED.  Patient is agreeable with discharge.  Discussed with evaluated Dr. Effie Shy. Final Clinical Impression(s) / ED Diagnoses Final diagnoses:  Diabetic foot [facility])    Rx / DC Orders ED Discharge Orders    None       Izaya Netherton, [place] N, PA-C 08/22/19 1730    Mancel Bale, MD 08/23/19 1513

## 2019-08-22 NOTE — ED Triage Notes (Signed)
Per EMS-states patient is coming from home, patient had a toe removed from left foot-has a [facility] RN to change and dress wound-last change was this am and noticed maggots in wound-patient has DM-CBG 116-patient is legally blind-sent here for possible necrosis

## 2019-08-22 NOTE — ED Provider Notes (Signed)
  Face-to-face evaluation   History: He presents for evaluation of wound to foot, being managed by home health services RN.  He has a chronic wound, was treated with left third toe amputation.  He is otherwise asymptomatic.  He is unable to see the foot because of blindness.  He states he can walk.  Physical exam: Elderly male alert and cooperative.  Left foot wound with chronic appearing distal foot wound which appears to be dehisced.  There are maggots and granulation tissue present.  Chronic skin changes around the wound.  No proximal streaking or fluctuance.  Medical screening examination/treatment/procedure(s) were conducted as a shared visit with non-physician practitioner(s) and myself.  I personally evaluated the patient during the encounter    Mancel Bale, MD 08/23/19 1513

## 2019-08-23 ENCOUNTER — Encounter: Payer: Self-pay | Admitting: Family

## 2019-08-23 ENCOUNTER — Ambulatory Visit: Payer: Medicare Other | Admitting: Family

## 2019-08-23 VITALS — Ht 77.0 in | Wt 207.0 lb

## 2019-08-23 DIAGNOSIS — B879 Myiasis, unspecified: Secondary | ICD-10-CM

## 2019-08-23 DIAGNOSIS — Z89422 Acquired absence of other left toe(s): Secondary | ICD-10-CM

## 2019-08-23 HISTORY — DX: Myiasis, unspecified: B87.9

## 2019-08-23 NOTE — Progress Notes (Signed)
Office Visit Note   Patient: Chad Hall           Date of Birth: [birth year]           MRN: [id number] Visit Date: 08/23/2019              Requested by: No referring provider defined for this encounter. PCP: System, Pcp Not In  Chief Complaint  Patient presents with  . Left Foot - Follow-up, Wound Check, Open Wound    12/28/2018 Left 3rd ray amputation        HPI: The patient is a 69 year old gentleman seen today for evaluation of dorsal ulcer to the left foot.  He is status post a left third ray amputation in November of last year.  This has been slow healing.  He has been doing dressing changes with home health assistance twice a week.  He did unfortunately present to the emergency department yesterday with a maggot infestation of his wound.  Was discharged with a wet-to-dry dressing.  He has not had any systemic symptoms no increase in his pain no new injuries he states that he does remove his dressing in the evening to let it get some air while he is sleeping  Assessment & Plan: Visit Diagnoses:  1. Hx of amputation of lesser toe, left (HCC)   2. Maggot infestation     Plan: Have placed a Dynaflex wrap for compression this includes the toes silver cell directly over in the wound bed.  Maggots that were visible were removed.  He will have this changed twice weekly  Follow-Up Instructions: Return in about 2 weeks (around 09/06/2019).   Ortho Exam  Patient is alert, oriented, no adenopathy, well-dressed, normal affect, normal respiratory effort. Edema and dermatitis to left foot. On examination of the left foot there is dorsal ulceration with surrounding maceration this is overlying the third ray amputation incision.  There is no depth there is no odor there is no purulence there is no erythema  Imaging: DG Foot Complete Left  Result Date: 08/22/2019 CLINICAL DATA:  Diabetic wound. EXAM: LEFT FOOT - COMPLETE 3+ VIEW COMPARISON:  December 25, 2018 FINDINGS: Since the  prior study there is been interval transmetatarsal amputation of the third left toe. There is no evidence of fracture or dislocation. There is no evidence of arthropathy or other focal bone abnormality. An ill-defined, superficial 1.4 cm soft tissue defect is seen in between the second and fourth left toes. No soft tissue air is seen. Moderate severity soft tissue swelling is seen along the left heel. IMPRESSION: 1. Interval transmetatarsal amputation of the third left toe. 2. Superficial soft tissue defect in between the second and fourth left toes. 3. No evidence of acute osteomyelitis. MRI correlation is recommended if this is of clinical concern. Electronically Signed   By: Aram Candela M.D.   On: 08/22/2019 15:34   No images are attached to the encounter.  Labs: Lab Results  Component Value Date   HGBA1C 7.0 05/04/2019   HGBA1C 6.4 (H) 12/26/2018   HGBA1C 6.5 09/26/2018   REPTSTATUS 12/29/2018 FINAL 12/26/2018   GRAMSTAIN  12/26/2018    RARE WBC PRESENT, PREDOMINANTLY PMN MODERATE GRAM POSITIVE COCCI IN PAIRS MODERATE GRAM NEGATIVE RODS Performed at [facility], [address], [place], [place] [address]    CULT  12/26/2018    MODERATE ESCHERICHIA COLI MODERATE ALCALIGENES FAECALIS FEW STREPTOCOCCUS MITIS/ORALIS    LABORGA ESCHERICHIA COLI 12/26/2018   LABORGA ALCALIGENES FAECALIS 12/26/2018  LABORGA STREPTOCOCCUS MITIS/ORALIS 12/26/2018     Lab Results  Component Value Date   ALBUMIN 2.2 (L) 12/27/2018   ALBUMIN 2.9 (L) 12/25/2018   ALBUMIN 4.3 01/06/2017    No results found for: MG No results found for: VD25OH  No results found for: PREALBUMIN CBC EXTENDED Latest Ref Rng & Units 08/22/2019 12/31/2018 12/30/2018  WBC 4.0 - 10.5 K/uL 7.4 11.7(H) 12.2(H)  RBC 4.22 - 5.81 MIL/uL 3.91(L) 3.26(L) 3.54(L)  HGB 13.0 - 17.0 g/dL 11.8(L) 9.7(L) 10.4(L)  HCT 39 - 52 % 38.6(L) 30.8(L) 33.9(L)  PLT 150 - 400 K/uL 266 343 360  NEUTROABS 1.7 - 7.7 K/uL 4.9 - -    LYMPHSABS 0.7 - 4.0 K/uL 1.1 - -     Body mass index is 24.55 kg/m.  Orders:  No orders of the defined types were placed in this encounter.  No orders of the defined types were placed in this encounter.    Procedures: No procedures performed  Clinical Data: No additional findings.  ROS:  All other systems negative, except as noted in the HPI. Review of Systems  Objective: Vital Signs: Ht 6\' 5"  (1.956 m)   Wt 207 lb (93.9 kg)   BMI 24.55 kg/m   Specialty Comments:  No specialty comments available.  PMFS History: Patient Active Problem List   Diagnosis Date Noted  . Maggot infestation 08/23/2019  . Pyogenic inflammation of bone (HCC)   . Osteomyelitis of foot, left, acute (HCC) 12/26/2018  . Right knee pain 10/04/2017  . Left hip pain 04/10/2013  . Diabetic peripheral neuropathy associated with type 2 diabetes mellitus (HCC) 11/10/2012  . Marfan's syndrome 08/08/2012  . Healthcare maintenance 08/08/2012  . Atopic dermatitis 02/16/2012  . Overweight (BMI 25.0-29.9) 07/05/2011  . Allergic rhinitis due to allergen 07/03/2011  . Venous insufficiency of leg 04/03/2011  . Blindness 07/23/2010  . DM (diabetes mellitus), type 2 with neurological complications (HCC) 06/13/2010  . Hypertension 06/13/2010  . Hyperlipidemia 06/13/2010   Past Medical History:  Diagnosis Date  . Allergy   . Blindness - both eyes 1967   Basketball injury  . Diabetes mellitus   . Hernia   . Hyperlipidemia   . Hypertension   . Marfan syndrome     Family History  Problem Relation Age of Onset  . Heart disease Mother   . Breast cancer Sister     Past Surgical History:  Procedure Laterality Date  . AMPUTATION Left 12/28/2018   Procedure: LEFT FOOT 3RD RAY AMPUTATION;  Surgeon: 13/12/2018, MD;  Location: [facility] OR;  Service: Orthopedics;  Laterality: Left;  . APPENDECTOMY    . INGUINAL HERNIA REPAIR     Right 1970s,    Left 2011 by Dr 2012  . INGUINAL HERNIA REPAIR  08/25/10    recurrent left  . PENILE PROSTHESIS IMPLANT  2000s   [facility], Dr 10/26/10  . RETINAL DETACHMENT SURGERY  1970  . Rotator Cuff Repair  2009   Left side, Dr 2010   . ROTATOR CUFF REPAIR  2010-2011?   right side   Social History   Occupational History  . Occupation: 12-05-1981: [employer]  Tobacco Use  . Smoking status: Former Smoker    Quit date: 02/14/1984    Years since quitting: 35.5  . Smokeless tobacco: Never Used  Substance and Sexual Activity  . Alcohol use: No    Comment: Quit in 10/1983  . Drug use: No  . Sexual  activity: Not on file

## 2019-08-24 ENCOUNTER — Other Ambulatory Visit: Payer: Self-pay

## 2019-08-24 ENCOUNTER — Ambulatory Visit (HOSPITAL_COMMUNITY): Payer: Medicare Other | Attending: Cardiology

## 2019-08-24 DIAGNOSIS — I7121 Aneurysm of the ascending aorta, without rupture: Secondary | ICD-10-CM

## 2019-08-24 DIAGNOSIS — I712 Thoracic aortic aneurysm, without rupture: Secondary | ICD-10-CM

## 2019-08-24 DIAGNOSIS — I719 Aortic aneurysm of unspecified site, without rupture: Secondary | ICD-10-CM | POA: Diagnosis present

## 2019-08-24 MED ORDER — PERFLUTREN LIPID MICROSPHERE
1.0000 mL | INTRAVENOUS | Status: AC | PRN
  Administered 2019-08-24: 2 mL via INTRAVENOUS

## 2019-08-28 ENCOUNTER — Telehealth: Payer: Self-pay | Admitting: Orthopedic Surgery

## 2019-08-28 NOTE — Telephone Encounter (Signed)
I called and spoke to Amy and she wanted clarification on what type of wraps to use on patient and if he needed anything on wound bed. She was informed to use Profore and silver cell dressing on patient and to change 2x a week.

## 2019-08-28 NOTE — Telephone Encounter (Signed)
Amy from [facility] called. She would like the new orders for patient's wound. Her call back number is [phone]

## 2019-09-04 ENCOUNTER — Ambulatory Visit
Admission: RE | Admit: 2019-09-04 | Discharge: 2019-09-04 | Disposition: A | Discharge status: Home / Self Care | Payer: Medicare Other | Source: Ambulatory Visit | Attending: Cardiothoracic Surgery | Admitting: Cardiothoracic Surgery

## 2019-09-04 ENCOUNTER — Other Ambulatory Visit: Payer: Self-pay | Admitting: Family Medicine

## 2019-09-04 ENCOUNTER — Other Ambulatory Visit: Payer: Self-pay | Admitting: Physician Assistant

## 2019-09-04 ENCOUNTER — Other Ambulatory Visit: Payer: Self-pay

## 2019-09-04 ENCOUNTER — Ambulatory Visit (INDEPENDENT_AMBULATORY_CARE_PROVIDER_SITE_OTHER): Payer: Medicare Other | Admitting: Cardiothoracic Surgery

## 2019-09-04 VITALS — BP 135/84 | HR 96 | Temp 97.9°F | Resp 16 | Ht 77.0 in | Wt 215.0 lb

## 2019-09-04 DIAGNOSIS — I359 Nonrheumatic aortic valve disorder, unspecified: Secondary | ICD-10-CM

## 2019-09-04 DIAGNOSIS — I712 Thoracic aortic aneurysm, without rupture, unspecified: Secondary | ICD-10-CM

## 2019-09-04 DIAGNOSIS — I358 Other nonrheumatic aortic valve disorders: Secondary | ICD-10-CM

## 2019-09-04 IMAGING — CT CT CHEST W/O CM
2 of 4 series · 13 of 36 positions shown, 16 images · non-contrast
Comparison: [DATE]

CLINICAL DATA: Follow-up thoracic aortic aneurysm

EXAM:
CT CHEST WITHOUT CONTRAST
TECHNIQUE: Multidetector CT imaging of the chest was performed following the
standard protocol without IV contrast.

[Series 2: chest 2.00 br40 s3 · axial · 0.63mm/px · z∈[+1518,+1818]mm · 10 of 176 slices shown, 13 images (1 of 2)]
[im 13/176  mediastinal]
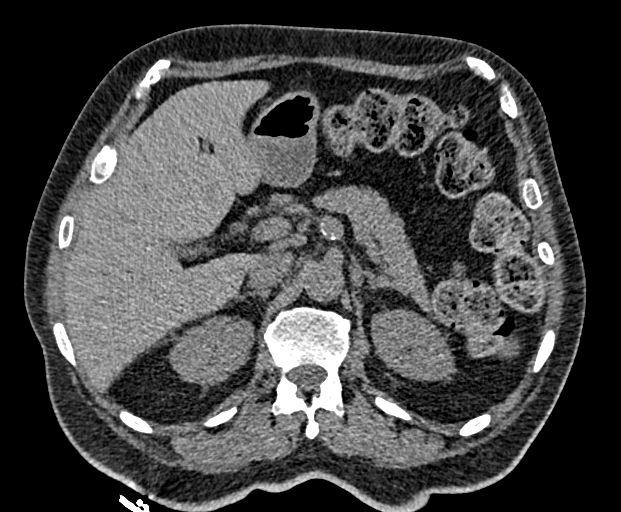
[im 13/176  lung]
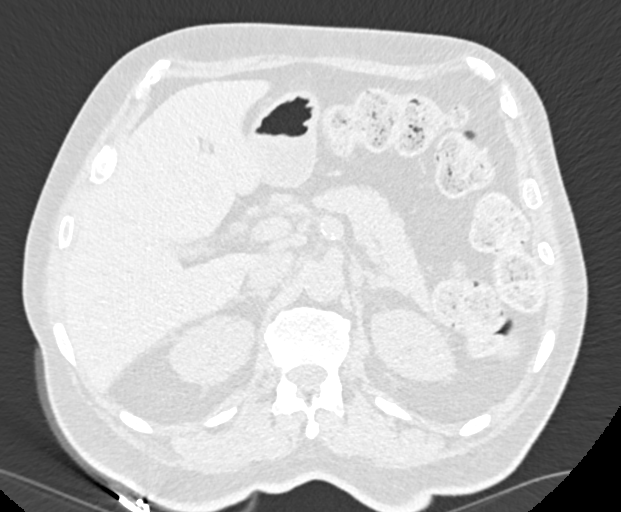
[im 26/176  lung]
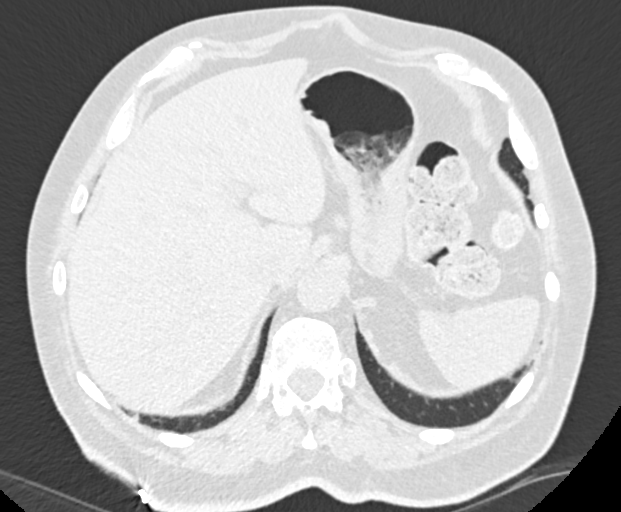
[im 51/176  lung]
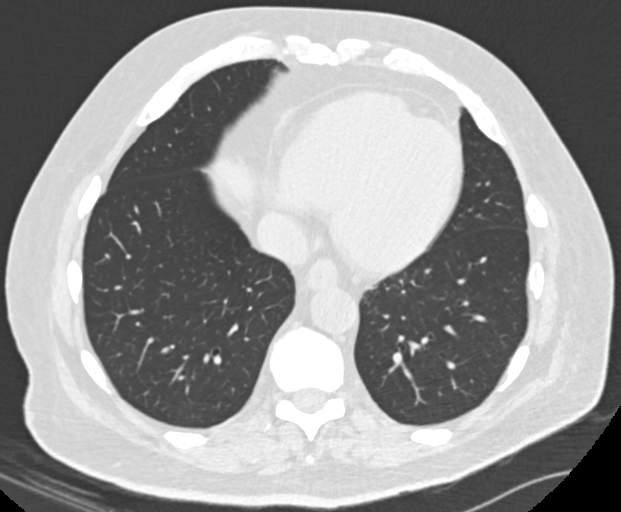
[im 63/176  lung]
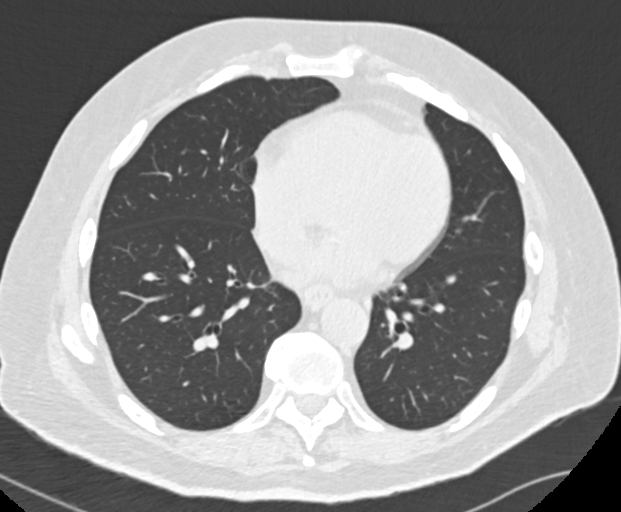
[im 76/176  mediastinal]
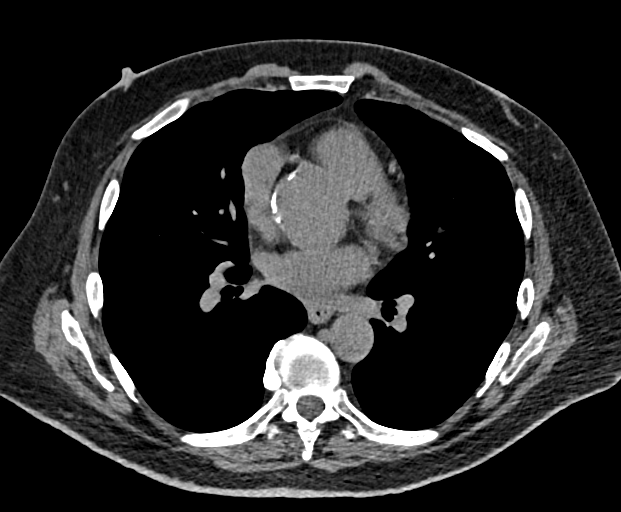
[im 76/176  lung]
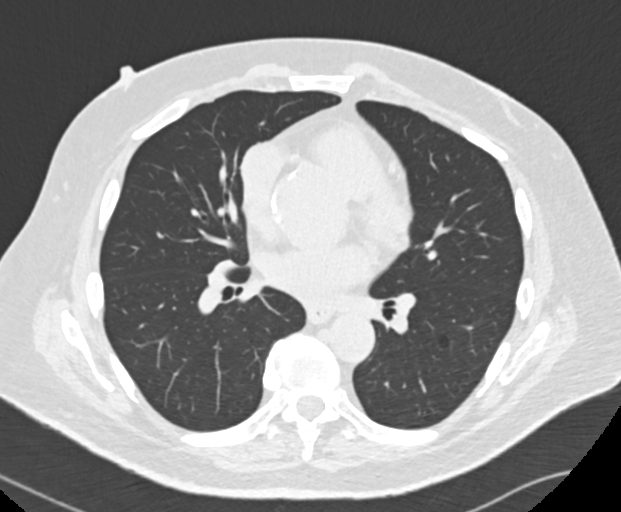
[im 101/176  lung]
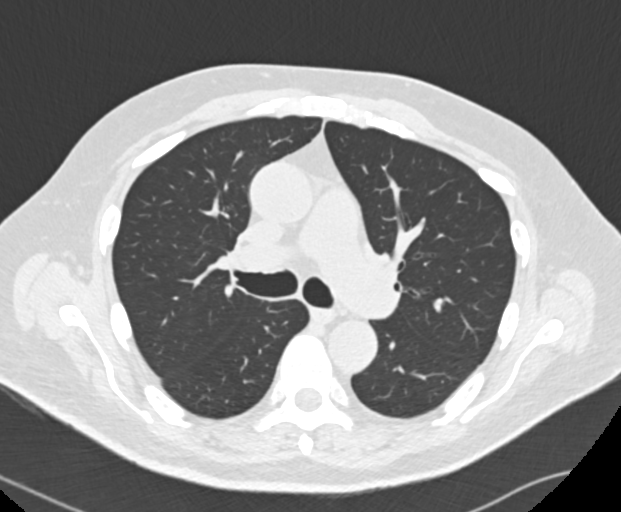
[im 113/176  lung]
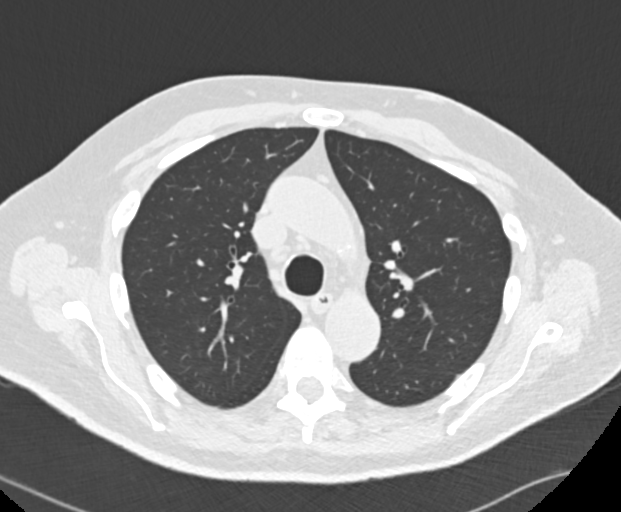
[im 126/176  lung]
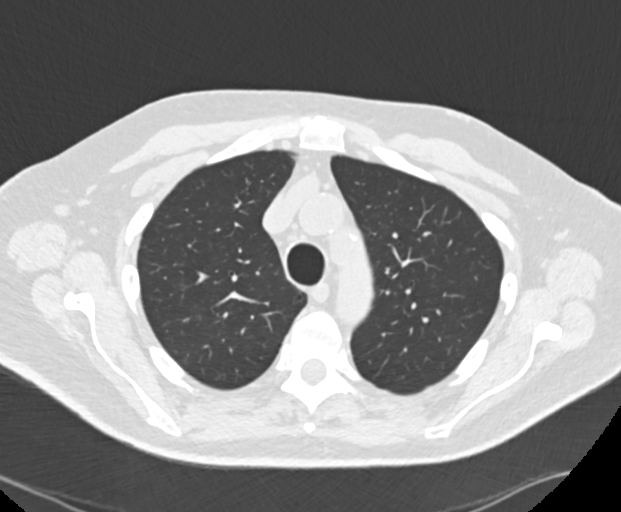
[im 151/176  mediastinal]
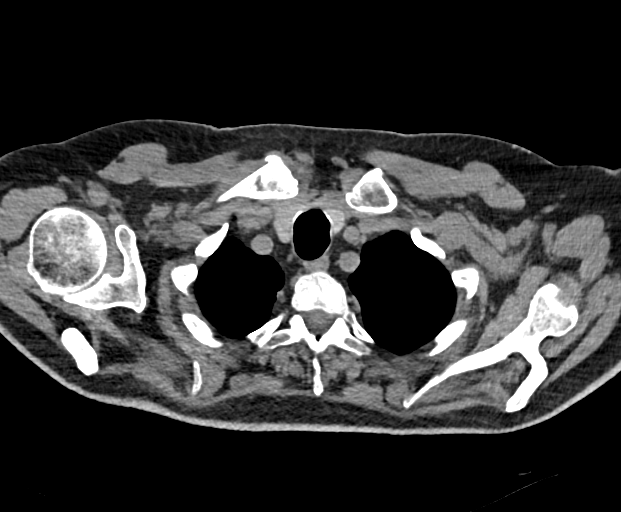
[im 151/176  lung]
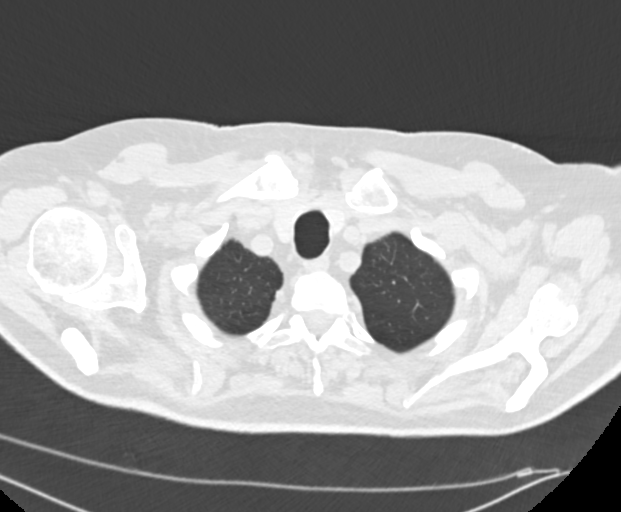
[im 163/176  lung]
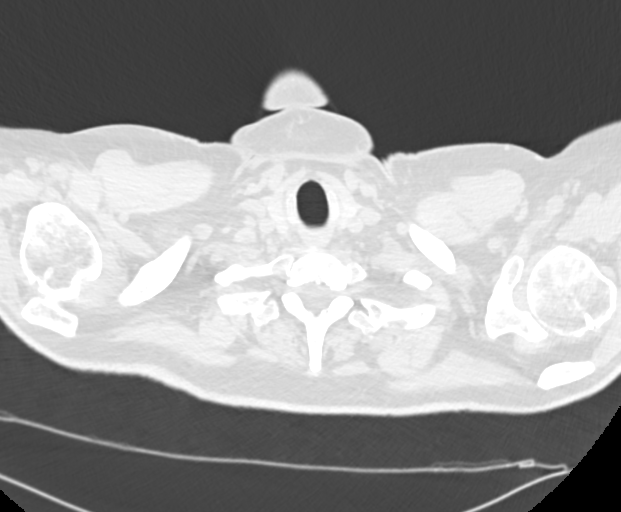

[Series 4: chest 2.00 br40 s3 · coronal · 0.69mm/px · 3 of 160 slices shown (2 of 2)]
[im 32/160  lung]
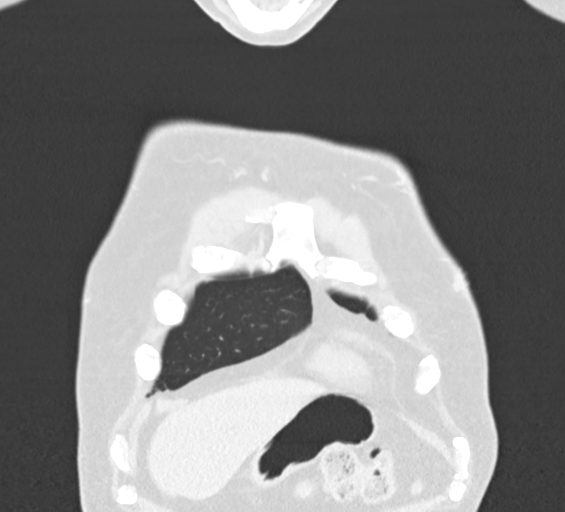
[im 64/160  lung]
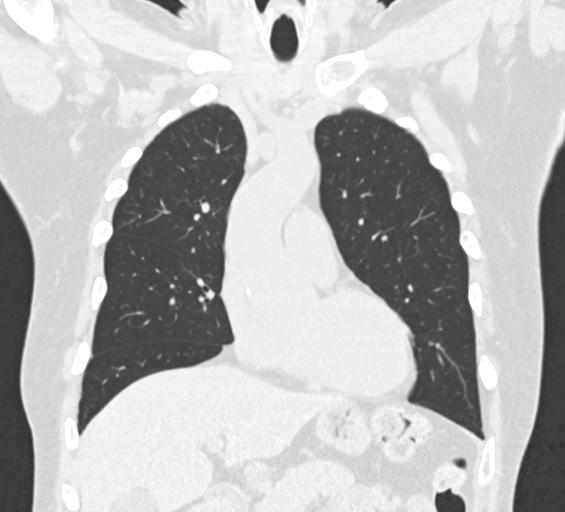
[im 96/160  lung]
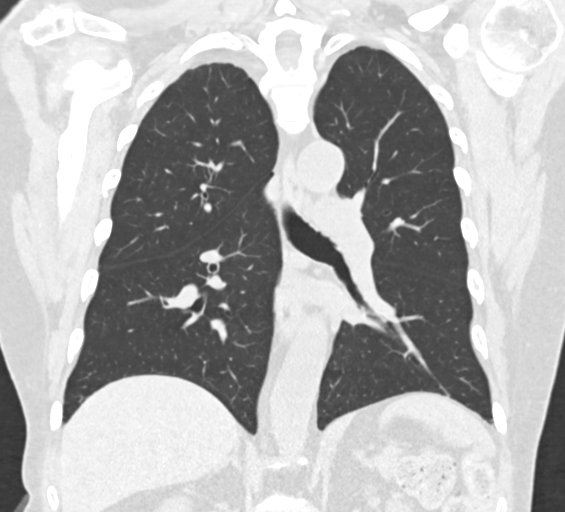

[13 of 36 positions shown; findings below may reference images not displayed]

FINDINGS: Cardiovascular: Atherosclerotic calcifications of the thoracic aorta
are noted. The ascending aorta is again prominent measuring 3.8 cm.
The TIGER junction measures approximately 3.4 cm and stable.
The area of the sinus of Valsalva measures approximately 4.3 cm but
somewhat limited due to the lack of IV contrast. Pulmonary artery as
visualized is within normal limits although evaluation is limited
due to lack of IV contrast. Coronary calcifications are noted. No
significant cardiac enlargement is noted. The descending thoracic
aorta shows a normal tapering.

Mediastinum/Nodes: Ask aorta is within normal limits. No sizable
hilar or mediastinal adenopathy is seen. Scattered small mediastinal
nodes are again seen and stable. Esophagus as visualized is within
normal limits.

Lungs/Pleura: Lungs are well aerated bilaterally. 5 mm nodule is
again noted superior segment of the left lower lobe stable in
appearance from the prior exam. This is best seen on image number 71
of series 8. A few small calcified granulomas are identified better
visualized on the current exam. Stable subpleural nodules are noted
in the left upper lobe.

Upper Abdomen: No acute abnormality.

Musculoskeletal: Degenerative changes of the thoracic spine are
noted.
IMPRESSION: Stable appearing ascending aortic dilatation at 3.8 cm.

Scattered small nodules stable from the prior exam. No follow-up
needed if patient is low-risk (and has no known or suspected primary
neoplasm). Non-contrast chest CT can be considered in 12 months if
patient is high-risk. This recommendation follows the consensus
statement: Guidelines for Management of Incidental Pulmonary Nodules
Detected on CT Images: From the [HOSPITAL] [6A]; Radiology

Aortic Atherosclerosis ([6A]-[6A]).

## 2019-09-04 NOTE — Progress Notes (Signed)
[address]       [place] [address]             [phone]     CARDIOTHORACIC SURGERY OFFICE NOTE  Referring Provider is Dollene Cleveland, DO Primary Cardiologist is No primary care provider on file. PCP is [name], MD   HPI:  69 year old man who was previously seen by virtual visitation has been followed for aortic root aneurysm and a history of Marfan syndrome.  His ascending aortic aneurysm is roughly 40 mm in maximal diameter but his root is over 5 cm in diameter.  He denies any shortness of breath or PND.    Current Outpatient Medications  Medication Sig Dispense Refill  . acetaminophen (TYLENOL) 325 MG tablet Take 2 tablets (650 mg total) by mouth every 6 (six) hours as needed for mild pain (or Fever >/= 101). 30 tablet 0  . augmented betamethasone dipropionate (DIPROLENE-AF) 0.05 % cream APPLY TO AFFECTED AREA(S)  TOPICALLY TWICE DAILY AS  DIRECTED 100 g 0  . cholecalciferol (VITAMIN D3) 25 MCG (1000 UT) tablet Take 1,000 Units by mouth at bedtime.    . enalapril (VASOTEC) 20 MG tablet TAKE 1 TABLET BY MOUTH  DAILY 90 tablet 3  . glipiZIDE (GLUCOTROL) 10 MG tablet Take 1 tablet (10 mg total) by mouth at bedtime. 30 tablet 3  . JARDIANCE 10 MG TABS tablet TAKE 1 TABLET BY MOUTH  DAILY (Patient taking differently: Take 10 mg by mouth daily. ) 90 tablet 3  . metoprolol succinate (TOPROL-XL) 25 MG 24 hr tablet Take 0.5 tablets (12.5 mg total) by mouth every evening. 45 tablet 3  . PRODIGY NO CODING BLOOD GLUC test strip USE TO CHECK BLOOD SUGAR  THREE TIMES DAILY OR AS  DIRECTED 300 strip 3  . Prodigy Twist Top Lancets 28G MISC CHECK BLOOD SUGAR 3 TIMES  DAILY 300 each 3  . TRADJENTA 5 MG TABS tablet TAKE 1 TABLET BY MOUTH  DAILY (Patient taking differently: Take 5 mg by mouth at bedtime. ) 90 tablet 3  . triamcinolone cream (KENALOG) 0.1 % APPLY TO AFFECTED AREA(S)  TOPICALLY TWICE DAILY 400 g 1  . atorvastatin (LIPITOR) 80 MG tablet TAKE 1 TABLET  BY MOUTH  DAILY AT 6 PM 90 tablet 3  . docusate sodium (COLACE) 100 MG capsule Take 1 capsule (100 mg total) by mouth daily as needed for mild constipation. (Patient not taking: Reported on 09/04/2019) 10 capsule 0  . doxycycline (VIBRA-TABS) 100 MG tablet TAKE 1 TABLET BY MOUTH  TWICE DAILY 60 tablet 0  . nitroGLYCERIN (NITRODUR - DOSED IN MG/24 HR) 0.2 mg/hr patch Place 1 patch (0.2 mg total) onto the skin daily. Apply to skin daily to different area of ankle and foot. Remove previous patch (Patient not taking: Reported on 09/04/2019) 30 patch 12  . ondansetron (ZOFRAN) 4 MG tablet Take 1 tablet (4 mg total) by mouth every 8 (eight) hours as needed for nausea or vomiting. (Patient not taking: Reported on 09/04/2019) 5 tablet 0  . oxyCODONE (OXY IR/ROXICODONE) 5 MG immediate release tablet Take 1 tablet (5 mg total) by mouth every 6 (six) hours as needed for severe pain (pain score 6-10). (Patient not taking: Reported on 09/04/2019) 5 tablet 0  . pentoxifylline (TRENTAL) 400 MG CR tablet TAKE 1 TABLET BY MOUTH 3  TIMES DAILY WITH MEALS 60 tablet 0   No current facility-administered medications for this visit.  Physical Exam:   BP 135/84   Pulse 96   Temp 97.9 F (36.6 C) (Temporal)   Resp 16   Ht 6\' 5"  (1.956 m)   Wt 97.5 kg   SpO2 98% Comment: RA  BMI 25.50 kg/m   General:  Well-appearing gentleman in no acute distress  Chest:   Clear to auscultation bilaterally  CV:   Regular rate and rhythm with no murmur  Neuro:   Grossly intact except blind  Abdomen:  Soft nontender nondistended  Extremities:  Status post left transmetatarsal amputation  Diagnostic Tests:  I have personally reviewed his available imaging studies eluding CT scan of the chest from 09/04/2019 and echocardiogram from 08/24/2019.  These demonstrate dilated aortic root to approximately 53 mm.  However there is no aortic valve insufficiency.   Impression:  69 year old gentleman with a history of Marfan's who is  followed for aortic root aneurysm.  Although this is worrisome in regards to its dilation, he has no recognized aortic valve insufficiency.  Plan:  Follow-up in 6 months with repeat echocardiogram to assess for any new valve disorder. He should report any new shortness of breath or peripheral edema.  I spent in excess of 20 minutes during the conduct of this office consultation and >50% of this time involved direct face-to-face encounter with the patient for counseling and/or coordination of their care.  Level 2                 10 minutes Level 3                 15 minutes Level 4                 25 minutes Level 5                 40 minutes  B.  66, MD 09/04/2019 11:05 PM

## 2019-09-11 ENCOUNTER — Other Ambulatory Visit: Payer: Self-pay

## 2019-09-11 ENCOUNTER — Ambulatory Visit (INDEPENDENT_AMBULATORY_CARE_PROVIDER_SITE_OTHER): Payer: Medicare Other | Admitting: Physician Assistant

## 2019-09-11 ENCOUNTER — Encounter: Payer: Self-pay | Admitting: Orthopedic Surgery

## 2019-09-11 ENCOUNTER — Telehealth: Payer: Self-pay

## 2019-09-11 DIAGNOSIS — M86172 Other acute osteomyelitis, left ankle and foot: Secondary | ICD-10-CM

## 2019-09-11 NOTE — Telephone Encounter (Signed)
I called and lm on vm to advise of message below. To call with questions.  

## 2019-09-11 NOTE — Progress Notes (Signed)
Office Visit Note   Patient: Chad Hall           Date of Birth: 1951-01-23           MRN: [id number] Visit Date: 09/11/2019              Requested by: [name], MD [address] [place],  [place] [address] PCP: [name], MD  Chief Complaint  Patient presents with  . Left Foot - Follow-up    12/28/18 left 3rd ray amputation       HPI: For follow-up on his left third ray amputation.  He was wrapped in a Profore dressing.  He is here for follow-up he is vision impaired does not have any significant complaints except for drainage.  Denies any fever or chills.  Assessment & Plan: Visit Diagnoses: No diagnosis found.  Plan: Patient was seen today directly by Dr. Lajoyce Corners.  Certainly have concerns but will go back to using a nitro patch and a dry dressing only.  He will follow-up in 1 week.  He knows he is on the verge of needing an amputation which he would like to avoid  Follow-Up Instructions: No follow-ups on file.   Ortho Exam  Patient is alert, oriented, no adenopathy, well-dressed, normal affect, normal respiratory effort.  Left foot he has a small ischemic ulcer on his great toe.  Third ray amputation some moisture but no foul odor.  Foot into the ankle has some vascular skin changes dorsalis pedis pulse was audible by Doppler but was diminished biphasic he has biphasic pulses in his posterior tibial.  No swelling or significant foul odor.  He has sloughing of the skin to where the top of the dressing was.  No cellulitis   Imaging: No results found. No images are attached to the encounter.  Labs: Lab Results  Component Value Date   HGBA1C 7.0 05/04/2019   HGBA1C 6.4 (H) 12/26/2018   HGBA1C 6.5 09/26/2018   REPTSTATUS 12/29/2018 FINAL 12/26/2018   GRAMSTAIN  12/26/2018    RARE WBC PRESENT, PREDOMINANTLY PMN MODERATE GRAM POSITIVE COCCI IN PAIRS MODERATE GRAM NEGATIVE RODS Performed at [facility], [address], [place], [place] [address]    CULT   12/26/2018    MODERATE ESCHERICHIA COLI MODERATE ALCALIGENES FAECALIS FEW STREPTOCOCCUS MITIS/ORALIS    LABORGA ESCHERICHIA COLI 12/26/2018   LABORGA ALCALIGENES FAECALIS 12/26/2018   LABORGA STREPTOCOCCUS MITIS/ORALIS 12/26/2018     Lab Results  Component Value Date   ALBUMIN 2.2 (L) 12/27/2018   ALBUMIN 2.9 (L) 12/25/2018   ALBUMIN 4.3 01/06/2017    No results found for: MG No results found for: VD25OH  No results found for: PREALBUMIN CBC EXTENDED Latest Ref Rng & Units 08/22/2019 12/31/2018 12/30/2018  WBC 4.0 - 10.5 K/uL 7.4 11.7(H) 12.2(H)  RBC 4.22 - 5.81 MIL/uL 3.91(L) 3.26(L) 3.54(L)  HGB 13.0 - 17.0 g/dL 11.8(L) 9.7(L) 10.4(L)  HCT 39 - 52 % 38.6(L) 30.8(L) 33.9(L)  PLT 150 - 400 K/uL 266 343 360  NEUTROABS 1.7 - 7.7 K/uL 4.9 - -  LYMPHSABS 0.7 - 4.0 K/uL 1.1 - -     There is no height or weight on file to calculate BMI.  Orders:  No orders of the defined types were placed in this encounter.  No orders of the defined types were placed in this encounter.    Procedures: No procedures performed  Clinical Data: No additional findings.  ROS:  All other systems negative, except as  noted in the HPI. Review of Systems  Objective: Vital Signs: There were no vitals taken for this visit.  Specialty Comments:  No specialty comments available.  PMFS History: Patient Active Problem List   Diagnosis Date Noted  . Maggot infestation 08/23/2019  . Pyogenic inflammation of bone (HCC)   . Osteomyelitis of foot, left, acute (HCC) 12/26/2018  . Right knee pain 10/04/2017  . Left hip pain 04/10/2013  . Diabetic peripheral neuropathy associated with type 2 diabetes mellitus (HCC) 11/10/2012  . Marfan's syndrome 08/08/2012  . Healthcare maintenance 08/08/2012  . Atopic dermatitis 02/16/2012  . Overweight (BMI 25.0-29.9) 07/05/2011  . Allergic rhinitis due to allergen 07/03/2011  . Venous insufficiency of leg 04/03/2011  . Blindness 07/23/2010  . DM (diabetes  mellitus), type 2 with neurological complications (HCC) 06/13/2010  . Hypertension 06/13/2010  . Hyperlipidemia 06/13/2010   Past Medical History:  Diagnosis Date  . Allergy   . Blindness - both eyes 1967   Basketball injury  . Diabetes mellitus   . Hernia   . Hyperlipidemia   . Hypertension   . Marfan syndrome     Family History  Problem Relation Age of Onset  . Heart disease Mother   . Breast cancer Sister     Past Surgical History:  Procedure Laterality Date  . AMPUTATION Left 12/28/2018   Procedure: LEFT FOOT 3RD RAY AMPUTATION;  Surgeon: Nadara Mustard, MD;  Location: [facility];  Service: Orthopedics;  Laterality: Left;  . APPENDECTOMY    . INGUINAL HERNIA REPAIR     Right 1970s,    Left 2011 by Dr Andrey Campanile  . INGUINAL HERNIA REPAIR  08/25/10   recurrent left  . PENILE PROSTHESIS IMPLANT  2000s   [facility], Dr Brunilda Payor  . RETINAL DETACHMENT SURGERY  1970  . Rotator Cuff Repair  2009   Left side, Dr Elita Quick   . ROTATOR CUFF REPAIR  2010-2011?   right side   Social History   Occupational History  . 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 Smoking status: Former Smoker    Quit date: 02/14/1984    Years since quitting: 35.5  . Smokeless tobacco: Never Used  Substance and Sexual Activity  . Alcohol use: No    Comment: Quit in 10/1983  . Drug use: No  . Sexual activity: Not on file

## 2019-09-11 NOTE — Telephone Encounter (Signed)
Just a dry dressing

## 2019-09-11 NOTE — Telephone Encounter (Signed)
Solon Palm, nurse at [facility] called stating that patient's skin around his left foot looked a little macerated, so she used some barrier cream.  Would like a call back for any changes to his order and an order to continue to use barrier cream for his left foot?  Cb# [phone].  Please advise.  Thank you.

## 2019-09-11 NOTE — Telephone Encounter (Signed)
Please see message below. You saw pt in office today advise of updated orders and I will call [facility]N

## 2019-09-18 ENCOUNTER — Ambulatory Visit: Payer: Medicare Other | Admitting: Orthopedic Surgery

## 2019-09-23 ENCOUNTER — Other Ambulatory Visit: Payer: Self-pay | Admitting: Physician Assistant

## 2019-09-25 ENCOUNTER — Ambulatory Visit: Payer: Medicare Other | Admitting: Orthopedic Surgery

## 2019-09-26 ENCOUNTER — Other Ambulatory Visit: Payer: Self-pay

## 2019-09-26 ENCOUNTER — Encounter: Payer: Self-pay | Admitting: Family Medicine

## 2019-09-26 ENCOUNTER — Ambulatory Visit: Payer: Medicare Other | Admitting: Family Medicine

## 2019-09-26 VITALS — BP 120/60 | HR 98 | Wt 200.2 lb

## 2019-09-26 DIAGNOSIS — M86172 Other acute osteomyelitis, left ankle and foot: Secondary | ICD-10-CM

## 2019-09-26 DIAGNOSIS — E1149 Type 2 diabetes mellitus with other diabetic neurological complication: Secondary | ICD-10-CM | POA: Diagnosis not present

## 2019-09-26 LAB — POCT GLYCOSYLATED HEMOGLOBIN (HGB A1C): HbA1c, POC (controlled diabetic range): 5.4 % (ref 0.0–7.0)

## 2019-09-26 NOTE — Assessment & Plan Note (Addendum)
A1c indicating very good control.  I do have some concern for future episodes of hypoglycemia.  We will stop glipizide today to avoid episodes of hypoglycemia. -Stop glipizide -Continue linagliptin -Continue Jardiance -Placed ambulatory referral to podiatry for further foot care -Follow-up in 6 months

## 2019-09-26 NOTE — Progress Notes (Signed)
    SUBJECTIVE:   CHIEF COMPLAINT / HPI:   Left foot chronic wounds Chad Hall had a left third ray amputation in November of this past year.  Since then, he has had difficulty with wound healing.  He does see Dr. Lajoyce Corners for this issue.  He wanted to know today if hyperbaric oxygen treatment would be helpful for his wound.  Diabetes, type II A1c 5.4 from 7.0 Linagliptin 5 mg daily Glipizide 10 mg daily Jardiance 10 mg daily Fasting blood sugars in the past week have been between 76 and 87.  He is not aware that he has had any episodes of low blood sugar.  PERTINENT  PMH / PSH: Hypertension, diabetes, osteomyelitis of left foot, blindness  OBJECTIVE:   BP 120/60   Pulse 98   Wt 200 lb 4 oz (90.8 kg)   SpO2 96%   BMI 23.75 kg/m    General: Alert and cooperative and appears to be in no acute distress.  Wearing sunglasses. Cardio: Normal S1 and S2, no S3 or S4. Rhythm is regular. No murmurs or rubs.   Pulm: Clear to auscultation bilaterally, no crackles, wheezing, or diminished breath sounds. Normal respiratory effort Right foot: Diabetic foot exam as below.  He is also found to have 2 large calluses beneath his first MTP as well as his third and fourth MTP.  These calluses were superficially debrided.  No other evidence of injury or ulceration of the foot.  Diabetic Foot Exam - Simple   Simple Foot Form Diabetic Foot exam was performed with the following findings: Yes 09/26/2019 10:50 AM  Visual Inspection See comments: Yes Sensation Testing See comments: Yes Pulse Check See comments: Yes Comments Palpable dorsalis pedis.  Difficult to palpate tibialis posterior.  1 callus beneath first MTP, one callus beneath MTP of the third and fourth digit.  Debrided.  No evidence of ulceration.  Is able to appreciate 6 out of the 7 monofilament tests.      ASSESSMENT/PLAN:   DM (diabetes mellitus), type 2 with neurological complications A1c indicating very good control.  I do  have some concern for future episodes of hypoglycemia.  We will stop glipizide today to avoid episodes of hypoglycemia. -Stop glipizide -Continue linagliptin -Continue Jardiance -Placed ambulatory referral to podiatry for further foot care -Follow-up in 6 months  Osteomyelitis of foot, left, acute (HCC) He was encouraged to follow-up with Dr. Lajoyce Corners for appropriate care of his chronic wound on the left foot.  Dr. Lajoyce Corners would have the best information and access to treatment with hyperbaric oxygen.     Chad Mo, MD [facility]

## 2019-09-26 NOTE — Patient Instructions (Addendum)
It was very nice to meet you today Mr. Fjelstad.  Glad that he came in to document your diabetes and your feet.  Diabetes: Your diabetes appears well controlled today.  I think we should stop taking the glipizide altogether.  That medication can lead to dangerously low blood sugars.  Continue taking your Jardiance and your Tradjenta.  Make sure you come back to clinic in 3 months for another diabetes check.  Foot calluses/injuries: I think it would be a very good idea for you to see a foot doctor (a podiatrist) regularly.  I am going to put in referral to podiatry for you.  He should get a call in the next week or 2 about setting up an appointment with podiatrist.  I will let you know if there is anything abnormal from your blood work today.

## 2019-09-26 NOTE — Assessment & Plan Note (Signed)
He was encouraged to follow-up with Dr. Lajoyce Corners for appropriate care of his chronic wound on the left foot.  Dr. Lajoyce Corners would have the best information and access to treatment with hyperbaric oxygen.

## 2019-09-27 LAB — BASIC METABOLIC PANEL
BUN/Creatinine Ratio: 18 (ref 10–24)
BUN: 26 mg/dL (ref 8–27)
CO2: 22 mmol/L (ref 20–29)
Calcium: 9.4 mg/dL (ref 8.6–10.2)
Chloride: 106 mmol/L (ref 96–106)
Creatinine, Ser: 1.46 mg/dL — ABNORMAL HIGH (ref 0.76–1.27)
GFR calc Af Amer: 55 mL/min/{1.73_m2} — ABNORMAL LOW (ref >59)
GFR calc non Af Amer: 48 mL/min/{1.73_m2} — ABNORMAL LOW (ref >59)
Glucose: 109 mg/dL — ABNORMAL HIGH (ref 65–99)
Potassium: 5.2 mmol/L (ref 3.5–5.2)
Sodium: 143 mmol/L (ref 134–144)

## 2019-10-05 ENCOUNTER — Encounter: Payer: Self-pay | Admitting: Family Medicine

## 2019-10-05 ENCOUNTER — Ambulatory Visit (INDEPENDENT_AMBULATORY_CARE_PROVIDER_SITE_OTHER): Payer: Medicare Other | Admitting: Orthopedic Surgery

## 2019-10-05 ENCOUNTER — Encounter: Payer: Self-pay | Admitting: Orthopedic Surgery

## 2019-10-05 DIAGNOSIS — Z89422 Acquired absence of other left toe(s): Secondary | ICD-10-CM | POA: Diagnosis not present

## 2019-10-05 DIAGNOSIS — L97521 Non-pressure chronic ulcer of other part of left foot limited to breakdown of skin: Secondary | ICD-10-CM

## 2019-10-05 NOTE — Progress Notes (Signed)
Office Visit Note   Patient: Chad Hall           Date of Birth: March 07, 1950           MRN: [id number] Visit Date: 10/05/2019              Requested by: [name], MD [address] [place],  [place] [address] PCP: [name], MD  Chief Complaint  Patient presents with  . Left Foot - Routine Post Op    12/28/18 left foor 3 rd ray amputation      HPI: Patient is a 69 year old gentleman who presents with a painful Wagner grade 1 ulcer beneath the fourth metatarsal head left foot he is status post a third ray amputation.  He complains of some drainage.  He is currently on Trental nitroglycerin patch to improve the microcirculation.  Assessment & Plan: Visit Diagnoses:  1. Hx of amputation of lesser toe, left (HCC)   2. Non-pressure chronic ulcer of other part of left foot limited to breakdown of skin (HCC)     Plan: Recommended patient continue with the Trental and the nitroglycerin.  The ulcer was debrided without complications recommended dry dressing for both the dorsal and plantar areas of the left foot.  Follow-Up Instructions: Return in about 4 weeks (around 11/02/2019).   Ortho Exam  Patient is alert, oriented, no adenopathy, well-dressed, normal affect, normal respiratory effort. Examination patient has had excellent improvement of the wound breakdown from the third ray amputation.  The wound is flat it is 1 x 3 cm 1 mm deep no cellulitis no signs of infection.  He does have a Wagner grade 1 ulcer.  This is 1 cm diameter.  After informed consent a 10 blade knife was used to debride the skin and soft tissue back to healthy viable tissue this is 3 cm in diameter after debridement and 2 mm deep.  Imaging: No results found. No images are attached to the encounter.  Labs: Lab Results  Component Value Date   HGBA1C 5.4 09/26/2019   HGBA1C 7.0 05/04/2019   HGBA1C 6.4 (H) 12/26/2018   REPTSTATUS 12/29/2018 FINAL 12/26/2018   GRAMSTAIN  12/26/2018    RARE WBC  PRESENT, PREDOMINANTLY PMN MODERATE GRAM POSITIVE COCCI IN PAIRS MODERATE GRAM NEGATIVE RODS Performed at [facility], [address], [place], [place] [address]    CULT  12/26/2018    MODERATE ESCHERICHIA COLI MODERATE ALCALIGENES FAECALIS FEW STREPTOCOCCUS MITIS/ORALIS    LABORGA ESCHERICHIA COLI 12/26/2018   LABORGA ALCALIGENES FAECALIS 12/26/2018   LABORGA STREPTOCOCCUS MITIS/ORALIS 12/26/2018     Lab Results  Component Value Date   ALBUMIN 2.2 (L) 12/27/2018   ALBUMIN 2.9 (L) 12/25/2018   ALBUMIN 4.3 01/06/2017    No results found for: MG No results found for: VD25OH  No results found for: PREALBUMIN CBC EXTENDED Latest Ref Rng & Units 08/22/2019 12/31/2018 12/30/2018  WBC 4.0 - 10.5 K/uL 7.4 11.7(H) 12.2(H)  RBC 4.22 - 5.81 MIL/uL 3.91(L) 3.26(L) 3.54(L)  HGB 13.0 - 17.0 g/dL 11.8(L) 9.7(L) 10.4(L)  HCT 39 - 52 % 38.6(L) 30.8(L) 33.9(L)  PLT 150 - 400 K/uL 266 343 360  NEUTROABS 1.7 - 7.7 K/uL 4.9 - -  LYMPHSABS 0.7 - 4.0 K/uL 1.1 - -     There is no height or weight on file to calculate BMI.  Orders:  No orders of the defined types were placed in this encounter.  No orders of the defined types were placed  in this encounter.    Procedures: No procedures performed  Clinical Data: No additional findings.  ROS:  All other systems negative, except as noted in the HPI. Review of Systems  Objective: Vital Signs: There were no vitals taken for this visit.  Specialty Comments:  No specialty comments available.  PMFS History: Patient Active Problem List   Diagnosis Date Noted  . Maggot infestation 08/23/2019  . Pyogenic inflammation of bone (HCC)   . Osteomyelitis of foot, left, acute (HCC) 12/26/2018  . Right knee pain 10/04/2017  . Left hip pain 04/10/2013  . Diabetic peripheral neuropathy associated with type 2 diabetes mellitus (HCC) 11/10/2012  . Marfan's syndrome 08/08/2012  . Healthcare maintenance 08/08/2012  . Atopic dermatitis  02/16/2012  . Overweight (BMI 25.0-29.9) 07/05/2011  . Allergic rhinitis due to allergen 07/03/2011  . Venous insufficiency of leg 04/03/2011  . Blindness 07/23/2010  . DM (diabetes mellitus), type 2 with neurological complications (HCC) 06/13/2010  . Hypertension 06/13/2010  . Hyperlipidemia 06/13/2010   Past Medical History:  Diagnosis Date  . Allergy   . Blindness - both eyes 1967   Basketball injury  . Diabetes mellitus   . Hernia   . Hyperlipidemia   . Hypertension   . Marfan syndrome     Family History  Problem Relation Age of Onset  . Heart disease Mother   . Breast cancer Sister     Past Surgical History:  Procedure Laterality Date  . AMPUTATION Left 12/28/2018   Procedure: LEFT FOOT 3RD RAY AMPUTATION;  Surgeon: Nadara Mustard, MD;  Location: [facility] OR;  Service: Orthopedics;  Laterality: Left;  . APPENDECTOMY    . INGUINAL HERNIA REPAIR     Right 1970s,    Left 2011 by Dr Andrey Campanile  . INGUINAL HERNIA REPAIR  08/25/10   recurrent left  . PENILE PROSTHESIS IMPLANT  2000s   [facility], Dr Brunilda Payor  . RETINAL DETACHMENT SURGERY  1970  . Rotator Cuff Repair  2009   Left side, Dr Elita Quick   . ROTATOR CUFF REPAIR  2010-2011?   right side   Social History   Occupational History  . 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 Smoking status: Former Smoker    Quit date: 02/14/1984    Years since quitting: 35.6  . Smokeless tobacco: Never Used  Substance and Sexual Activity  . Alcohol use: No    Comment: Quit in 10/1983  . Drug use: No  . Sexual activity: Not on file

## 2019-10-12 ENCOUNTER — Other Ambulatory Visit: Payer: Self-pay

## 2019-10-12 ENCOUNTER — Encounter: Payer: Self-pay | Admitting: Podiatry

## 2019-10-12 ENCOUNTER — Other Ambulatory Visit: Payer: Self-pay | Admitting: Family Medicine

## 2019-10-12 ENCOUNTER — Ambulatory Visit: Payer: Medicare Other | Admitting: Podiatry

## 2019-10-12 ENCOUNTER — Telehealth: Payer: Self-pay

## 2019-10-12 VITALS — BP 152/78 | HR 97

## 2019-10-12 DIAGNOSIS — L97511 Non-pressure chronic ulcer of other part of right foot limited to breakdown of skin: Secondary | ICD-10-CM

## 2019-10-12 DIAGNOSIS — L97522 Non-pressure chronic ulcer of other part of left foot with fat layer exposed: Secondary | ICD-10-CM

## 2019-10-12 DIAGNOSIS — E1149 Type 2 diabetes mellitus with other diabetic neurological complication: Secondary | ICD-10-CM

## 2019-10-12 DIAGNOSIS — E1142 Type 2 diabetes mellitus with diabetic polyneuropathy: Secondary | ICD-10-CM

## 2019-10-12 MED ORDER — GABAPENTIN 100 MG PO CAPS
100.0000 mg | ORAL_CAPSULE | Freq: Every day | ORAL | 2 refills | Status: DC
Start: 2019-10-12 — End: 2019-10-17

## 2019-10-12 NOTE — Telephone Encounter (Signed)
Patient LVM on nurse line requesting something for neuropathy pain. Patient would like this sent to [facility]. Please advise.

## 2019-10-12 NOTE — Patient Instructions (Signed)
Look for Urea cream 40% at the pharmacy or online

## 2019-10-12 NOTE — Progress Notes (Signed)
  Subjective:  Patient ID: Chad Hall, male    DOB: Oct 02, 1950,  MRN: [id number]  Chief Complaint  Patient presents with  . Diabetic Ulcer    chronic of left foot, status post amputation of lesser toe left  . Leg Swelling    Left lower leg and foot  . Peripheral Neuropathy    69 y.o. male presents with the above complaint. History confirmed with patient.  Presents for evaluation of right foot calluses, he has been seeing Dr. Lajoyce Corners for his left foot osteomyelitis and chronic wounds.  Calluses are intermittently painful.  He does report paresthesias in the right lower extremity.  Objective:  Physical Exam: Pes planus noted bilaterally, he has chronic ulceration of the left lower extremity of the dorsal midfoot, plantar heel and submet 4, on the right foot he has preulcerative calluses submet 4, 1, plantar heel.  Debridement of the submet for callus reveals a small 4 mm x 1 mm fissure which penetrates to the dermis.  No signs of infection here. Assessment:   1. Ulcer of left foot with fat layer exposed (HCC)   2. Ulcer of right foot, limited to breakdown of skin [facility])      Plan:  Patient was evaluated and treated and all questions answered.  All symptomatic hyperkeratoses were safely debrided with a sterile #15 blade to patient's level of comfort without incident. We discussed preventative and palliative care of these lesions including supportive and accommodative shoegear, padding, prefabricated and custom molded accommodative orthoses, use of a pumice stone and lotions/creams daily.  Recommended he use urea cream 40% for the calluses  For the preulcerative callus with the ulcer on the right foot, I recommended he use an antibiotic ointment and adhesive bandage for this for now, he will discuss this further with Dr. Lajoyce Corners on his next visit for the left foot ulcers.  Ulcer right foot submet 4 -Debridement as below. -Dressed with antibiotic ointment, DSD. -Continue off-loading with  surgical shoe.  Procedure: Excisional Debridement of Wound Rationale: Removal of non-viable soft tissue from the wound to promote healing.  Anesthesia: none Pre-Debridement Wound Measurements: 0.4 cm x 0.1 cm x 0.3 cm  Post-Debridement Wound Measurements: 0.4 cm x 0.1 cm x 0.3 cm  Type of Debridement: Sharp selective Tissue Removed: Non-viable soft tissue Depth of Debridement: subcutaneous tissue. Technique: Sharp selective debridement to bleeding, viable wound base.  Dressing: Dry, sterile, compression dressing. Disposition: Patient tolerated procedure well.   Return in about 3 months (around 01/12/2020) for diabetic foot exam and care.       Patient educated on diabetes. Discussed proper diabetic foot care and discussed risks and complications of disease. Educated patient in depth on reasons to return to the office immediately should he/she discover anything concerning or new on the feet. All questions answered. Discussed proper shoes as well.   Sharl Ma, DPM 10/12/2019     Return in about 3 months (around 01/12/2020) for diabetic foot exam and care.

## 2019-10-12 NOTE — Telephone Encounter (Signed)
Called pt and discussed new medication.  Gabapentin sent to pharmacy. [name], MD

## 2019-10-17 ENCOUNTER — Encounter: Payer: Self-pay | Admitting: Family Medicine

## 2019-10-17 MED ORDER — GABAPENTIN 100 MG PO CAPS
100.0000 mg | ORAL_CAPSULE | Freq: Every day | ORAL | 2 refills | Status: DC
Start: 2019-10-17 — End: 2019-10-18

## 2019-10-17 NOTE — Progress Notes (Signed)
Received phone call from patient regarding rx. Upon chart review, rx was set to "print", will resend as "normal". Attempted to resend, however, script reverted back to print. Will fax rx to [facility].   Veronda Prude, RN

## 2019-10-17 NOTE — Addendum Note (Signed)
Addended by: Glori Bickers C on: 10/17/2019 11:01 AM   Modules accepted: Orders

## 2019-10-18 MED ORDER — GABAPENTIN 100 MG PO CAPS: 100.0000 mg | ORAL_CAPSULE | Freq: Every day | ORAL | 2 refills | Status: DC

## 2019-10-20 ENCOUNTER — Other Ambulatory Visit: Payer: Self-pay

## 2019-10-20 MED ORDER — GABAPENTIN 100 MG PO CAPS: 100.0000 mg | ORAL_CAPSULE | Freq: Every day | ORAL | 2 refills | Status: DC

## 2019-10-23 ENCOUNTER — Other Ambulatory Visit: Payer: Self-pay | Admitting: Physician Assistant

## 2019-10-23 ENCOUNTER — Other Ambulatory Visit: Payer: Self-pay | Admitting: Family Medicine

## 2019-10-25 ENCOUNTER — Telehealth: Payer: Self-pay

## 2019-10-25 NOTE — Telephone Encounter (Signed)
Cordelia Pen, [facility] Nurse, calls nurse line reporting left foot wound. Cordelia Pen reports no odor or drainage. Cordelia Pen reports she placed a calcium-alginate dressing on top and will continue to monitor. Cordelia Pen informed to have patient schedule an apt for any developing signs of infection.

## 2019-10-29 ENCOUNTER — Other Ambulatory Visit: Payer: Self-pay | Admitting: Family Medicine

## 2019-11-02 ENCOUNTER — Ambulatory Visit: Payer: Self-pay

## 2019-11-02 ENCOUNTER — Ambulatory Visit (INDEPENDENT_AMBULATORY_CARE_PROVIDER_SITE_OTHER): Payer: Medicare Other | Admitting: Orthopedic Surgery

## 2019-11-02 ENCOUNTER — Encounter: Payer: Self-pay | Admitting: Orthopedic Surgery

## 2019-11-02 DIAGNOSIS — M79672 Pain in left foot: Secondary | ICD-10-CM | POA: Diagnosis not present

## 2019-11-02 NOTE — Progress Notes (Signed)
Office Visit Note   Patient: Chad Hall           Date of Birth: 03-21-1950           MRN: [id number] Visit Date: 11/02/2019              Requested by: [name], MD [address] [place],  [place] [address] PCP: [name], MD  Chief Complaint  Patient presents with  . Left Foot - Follow-up      HPI: Patient is a 69 year old gentleman with diabetes he is legally blind he is 10 months status post a left foot third ray amputation.  Assessment & Plan: Visit Diagnoses:  1. Pain in left foot     Plan: Discussed with the patient with the new ulceration swelling and bony changes of the fourth toe this is infected and best treatment option for foot salvage would be a transmetatarsal amputation.  Patient ideally would benefit from a transtibial amputation but this would make it extremely difficult for patient to ambulate with being blind he states he would like to wait on deciding about the transmetatarsal amputation he will continue his doxycycline continue nitroglycerin patch continue Trental.  Follow-Up Instructions: Return in about 1 week (around 11/09/2019).   Ortho Exam  Patient is alert, oriented, no adenopathy, well-dressed, normal affect, normal respiratory effort. Examination patient has a biphasic dopplerable dorsalis pedis and posterior tibial pulse he has acute swelling and discoloration of the fourth toe with an ulcer that extends down to bone.  Radiograph shows destructive lytic changes of the fourth metatarsal head.  There is no ascending cellulitis is previous third ray amputation incision has healed nicely with no exposed bone or tendon.  Imaging: XR Foot Complete Left  Result Date: 11/02/2019 Three-view radiographs of the left foot shows some lytic changes to the fourth metatarsal head.  No images are attached to the encounter.  Labs: Lab Results  Component Value Date   HGBA1C 5.4 09/26/2019   HGBA1C 7.0 05/04/2019   HGBA1C 6.4 (H) 12/26/2018    REPTSTATUS 12/29/2018 FINAL 12/26/2018   GRAMSTAIN  12/26/2018    RARE WBC PRESENT, PREDOMINANTLY PMN MODERATE GRAM POSITIVE COCCI IN PAIRS MODERATE GRAM NEGATIVE RODS Performed at [facility], [address], [place], [place] [address]    CULT  12/26/2018    MODERATE ESCHERICHIA COLI MODERATE ALCALIGENES FAECALIS FEW STREPTOCOCCUS MITIS/ORALIS    LABORGA ESCHERICHIA COLI 12/26/2018   LABORGA ALCALIGENES FAECALIS 12/26/2018   LABORGA STREPTOCOCCUS MITIS/ORALIS 12/26/2018     Lab Results  Component Value Date   ALBUMIN 2.2 (L) 12/27/2018   ALBUMIN 2.9 (L) 12/25/2018   ALBUMIN 4.3 01/06/2017    No results found for: MG No results found for: VD25OH  No results found for: PREALBUMIN CBC EXTENDED Latest Ref Rng & Units 08/22/2019 12/31/2018 12/30/2018  WBC 4.0 - 10.5 K/uL 7.4 11.7(H) 12.2(H)  RBC 4.22 - 5.81 MIL/uL 3.91(L) 3.26(L) 3.54(L)  HGB 13.0 - 17.0 g/dL 11.8(L) 9.7(L) 10.4(L)  HCT 39 - 52 % 38.6(L) 30.8(L) 33.9(L)  PLT 150 - 400 K/uL 266 343 360  NEUTROABS 1.7 - 7.7 K/uL 4.9 - -  LYMPHSABS 0.7 - 4.0 K/uL 1.1 - -     There is no height or weight on file to calculate BMI.  Orders:  Orders Placed This Encounter  Procedures  . XR Foot Complete Left   No orders of the defined types were placed in this encounter.    Procedures: No procedures performed  Clinical Data: No additional findings.  ROS:  All other systems negative, except as noted in the HPI. Review of Systems  Objective: Vital Signs: There were no vitals taken for this visit.  Specialty Comments:  No specialty comments available.  PMFS History: Patient Active Problem List   Diagnosis Date Noted  . Maggot infestation 08/23/2019  . Pyogenic inflammation of bone (HCC)   . Osteomyelitis of foot, left, acute (HCC) 12/26/2018  . Right knee pain 10/04/2017  . Left hip pain 04/10/2013  . Diabetic peripheral neuropathy associated with type 2 diabetes mellitus (HCC) 11/10/2012  . Marfan's  syndrome 08/08/2012  . Healthcare maintenance 08/08/2012  . Atopic dermatitis 02/16/2012  . Overweight (BMI 25.0-29.9) 07/05/2011  . Allergic rhinitis due to allergen 07/03/2011  . Venous insufficiency of leg 04/03/2011  . Blindness 07/23/2010  . DM (diabetes mellitus), type 2 with neurological complications (HCC) 06/13/2010  . Hypertension 06/13/2010  . Hyperlipidemia 06/13/2010   Past Medical History:  Diagnosis Date  . Allergy   . Blindness - both eyes 1967   Basketball injury  . Diabetes mellitus   . Hernia   . Hyperlipidemia   . Hypertension   . Marfan syndrome     Family History  Problem Relation Age of Onset  . Heart disease Mother   . Breast cancer Sister     Past Surgical History:  Procedure Laterality Date  . AMPUTATION Left 12/28/2018   Procedure: LEFT FOOT 3RD RAY AMPUTATION;  Surgeon: Nadara Mustard, MD;  Location: [facility] OR;  Service: Orthopedics;  Laterality: Left;  . APPENDECTOMY    . INGUINAL HERNIA REPAIR     Right 1970s,    Left 2011 by Dr Andrey Campanile  . INGUINAL HERNIA REPAIR  08/25/10   recurrent left  . PENILE PROSTHESIS IMPLANT  2000s   [facility], Dr Brunilda Payor  . RETINAL DETACHMENT SURGERY  1970  . Rotator Cuff Repair  2009   Left side, Dr Elita Quick   . ROTATOR CUFF REPAIR  2010-2011?   right side   Social History   Occupational History  . 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 Smoking status: Former Smoker    Quit date: 02/14/1984    Years since quitting: 35.7  . Smokeless tobacco: Never Used  Substance and Sexual Activity  . Alcohol use: No    Comment: Quit in 10/1983  . Drug use: No  . Sexual activity: Not on file

## 2019-11-05 ENCOUNTER — Encounter: Payer: Self-pay | Admitting: Family Medicine

## 2019-11-12 ENCOUNTER — Other Ambulatory Visit: Payer: Self-pay | Admitting: Physician Assistant

## 2019-11-12 ENCOUNTER — Other Ambulatory Visit: Payer: Self-pay | Admitting: Family Medicine

## 2019-11-13 ENCOUNTER — Encounter: Payer: Self-pay | Admitting: Orthopedic Surgery

## 2019-11-13 ENCOUNTER — Ambulatory Visit (INDEPENDENT_AMBULATORY_CARE_PROVIDER_SITE_OTHER): Payer: Medicare Other | Admitting: Physician Assistant

## 2019-11-13 VITALS — Ht 77.0 in | Wt 200.0 lb

## 2019-11-13 DIAGNOSIS — M86172 Other acute osteomyelitis, left ankle and foot: Secondary | ICD-10-CM | POA: Diagnosis not present

## 2019-11-13 NOTE — Progress Notes (Signed)
Office Visit Note   Patient: Chad Hall           Date of Birth: 10-06-1950           MRN: [id number] Visit Date: 11/13/2019              Requested by: [name], MD [address] [place],  [place] [address] PCP: [name], MD  Chief Complaint  Patient presents with  . Left Foot - Follow-up    1 week follow up      HPI: This is a pleasant 69 year old gentleman who is visually impaired.  We have been following him for a left foot fourth toe infection.  He does have neuropathy.  He is currently taking doxycycline and using Trental.  He is resistant to surgery  Assessment & Plan: Visit Diagnoses: No diagnosis found.  Plan: Patient was seen directly by Dr. Lajoyce Corners today he states that his option now would only be a below-knee amputation given the depth of the heel ulcer.  Follow-up in 2 weeks continue with dry dressing changes patient understands if he begins to feel ill he is to go to the nearest emergency room  Follow-Up Instructions: No follow-ups on file.   Ortho Exam  Patient is alert, oriented, no adenopathy, well-dressed, normal affect, normal respiratory effort. Focused examination of his left foot demonstrates maceration and discoloration from the tip of the toe up to mid calf.  There is a foul odor.  Incision from the ray amputation is healed.  However there is maceration throughout the fourth toe with swelling and ulcers on the lateral side of the fourth toe.  Also a ulcer that is deep on the plantar forefoot and at the fourth toe and probes to bone.  Also a 4 cm diameter heel ulcer that almost probes to bone as well.  No cellulitis.  Imaging: No results found. No images are attached to the encounter.  Labs: Lab Results  Component Value Date   HGBA1C 5.4 09/26/2019   HGBA1C 7.0 05/04/2019   HGBA1C 6.4 (H) 12/26/2018   REPTSTATUS 12/29/2018 FINAL 12/26/2018   GRAMSTAIN  12/26/2018    RARE WBC PRESENT, PREDOMINANTLY PMN MODERATE GRAM POSITIVE COCCI IN  PAIRS MODERATE GRAM NEGATIVE RODS Performed at [facility], [address], [place], [place] [address]    CULT  12/26/2018    MODERATE ESCHERICHIA COLI MODERATE ALCALIGENES FAECALIS FEW STREPTOCOCCUS MITIS/ORALIS    LABORGA ESCHERICHIA COLI 12/26/2018   LABORGA ALCALIGENES FAECALIS 12/26/2018   LABORGA STREPTOCOCCUS MITIS/ORALIS 12/26/2018     Lab Results  Component Value Date   ALBUMIN 2.2 (L) 12/27/2018   ALBUMIN 2.9 (L) 12/25/2018   ALBUMIN 4.3 01/06/2017    No results found for: MG No results found for: VD25OH  No results found for: PREALBUMIN CBC EXTENDED Latest Ref Rng & Units 08/22/2019 12/31/2018 12/30/2018  WBC 4.0 - 10.5 K/uL 7.4 11.7(H) 12.2(H)  RBC 4.22 - 5.81 MIL/uL 3.91(L) 3.26(L) 3.54(L)  HGB 13.0 - 17.0 g/dL 11.8(L) 9.7(L) 10.4(L)  HCT 39 - 52 % 38.6(L) 30.8(L) 33.9(L)  PLT 150 - 400 K/uL 266 343 360  NEUTROABS 1.7 - 7.7 K/uL 4.9 - -  LYMPHSABS 0.7 - 4.0 K/uL 1.1 - -     Body mass index is 23.72 kg/m.  Orders:  No orders of the defined types were placed in this encounter.  No orders of the defined types were placed in this encounter.    Procedures: No procedures performed  Clinical  Data: No additional findings.  ROS:  All other systems negative, except as noted in the HPI. Review of Systems  Objective: Vital Signs: Ht 6\' 5"  (1.956 m)   Wt 200 lb (90.7 kg)   BMI 23.72 kg/m   Specialty Comments:  No specialty comments available.  PMFS History: Patient Active Problem List   Diagnosis Date Noted  . Maggot infestation 08/23/2019  . Pyogenic inflammation of bone (HCC)   . Osteomyelitis of foot, left, acute (HCC) 12/26/2018  . Right knee pain 10/04/2017  . Left hip pain 04/10/2013  . Diabetic peripheral neuropathy associated with type 2 diabetes mellitus (HCC) 11/10/2012  . Marfan's syndrome 08/08/2012  . Healthcare maintenance 08/08/2012  . Atopic dermatitis 02/16/2012  . Overweight (BMI 25.0-29.9) 07/05/2011  . Allergic  rhinitis due to allergen 07/03/2011  . Venous insufficiency of leg 04/03/2011  . Blindness 07/23/2010  . DM (diabetes mellitus), type 2 with neurological complications (HCC) 06/13/2010  . Hypertension 06/13/2010  . Hyperlipidemia 06/13/2010   Past Medical History:  Diagnosis Date  . Allergy   . Blindness - both eyes 1967   Basketball injury  . Diabetes mellitus   . Hernia   . Hyperlipidemia   . Hypertension   . Marfan syndrome     Family History  Problem Relation Age of Onset  . Heart disease Mother   . Breast cancer Sister     Past Surgical History:  Procedure Laterality Date  . AMPUTATION Left 12/28/2018   Procedure: LEFT FOOT 3RD RAY AMPUTATION;  Surgeon: 13/12/2018, MD;  Location: [facility] OR;  Service: Orthopedics;  Laterality: Left;  . APPENDECTOMY    . INGUINAL HERNIA REPAIR     Right 1970s,    Left 2011 by Dr 2012  . INGUINAL HERNIA REPAIR  08/25/10   recurrent left  . PENILE PROSTHESIS IMPLANT  2000s   [facility], Dr 10/26/10  . RETINAL DETACHMENT SURGERY  1970  . Rotator Cuff Repair  2009   Left side, Dr 2010   . ROTATOR CUFF REPAIR  2010-2011?   right side   Social History   Occupational History  . Occupation: 12-05-1981: [employer]  Tobacco Use  . Smoking status: Former Smoker    Quit date: 02/14/1984    Years since quitting: 35.7  . Smokeless tobacco: Never Used  Substance and Sexual Activity  . Alcohol use: No    Comment: Quit in 10/1983  . Drug use: No  . Sexual activity: Not on file

## 2019-11-24 ENCOUNTER — Other Ambulatory Visit: Payer: Self-pay | Admitting: Internal Medicine

## 2019-11-24 ENCOUNTER — Encounter (HOSPITAL_BASED_OUTPATIENT_CLINIC_OR_DEPARTMENT_OTHER): Payer: Medicare Other | Attending: Internal Medicine | Admitting: Internal Medicine

## 2019-11-24 ENCOUNTER — Other Ambulatory Visit (HOSPITAL_COMMUNITY): Payer: Self-pay | Admitting: Internal Medicine

## 2019-11-24 DIAGNOSIS — I87332 Chronic venous hypertension (idiopathic) with ulcer and inflammation of left lower extremity: Secondary | ICD-10-CM | POA: Insufficient documentation

## 2019-11-24 DIAGNOSIS — E11621 Type 2 diabetes mellitus with foot ulcer: Secondary | ICD-10-CM | POA: Insufficient documentation

## 2019-11-24 DIAGNOSIS — L97528 Non-pressure chronic ulcer of other part of left foot with other specified severity: Secondary | ICD-10-CM | POA: Diagnosis not present

## 2019-11-24 DIAGNOSIS — Y839 Surgical procedure, unspecified as the cause of abnormal reaction of the patient, or of later complication, without mention of misadventure at the time of the procedure: Secondary | ICD-10-CM | POA: Insufficient documentation

## 2019-11-24 DIAGNOSIS — T8131XD Disruption of external operation (surgical) wound, not elsewhere classified, subsequent encounter: Secondary | ICD-10-CM | POA: Insufficient documentation

## 2019-11-24 DIAGNOSIS — S91302A Unspecified open wound, left foot, initial encounter: Secondary | ICD-10-CM

## 2019-11-24 DIAGNOSIS — L97423 Non-pressure chronic ulcer of left heel and midfoot with necrosis of muscle: Secondary | ICD-10-CM | POA: Insufficient documentation

## 2019-11-27 ENCOUNTER — Ambulatory Visit: Payer: Medicare Other | Admitting: Physician Assistant

## 2019-11-28 ENCOUNTER — Other Ambulatory Visit: Payer: Self-pay | Admitting: Physician Assistant

## 2019-11-28 NOTE — Progress Notes (Signed)
Chad Hall, Chad Hall ([id number]) Visit Report for 11/24/2019 Allergy List Details Patient Name: Date of Service: Chad Scott [place]. 11/24/2019 9:00 A M Medical Record Number: [id number] Patient Account Number: [id number] Date of Birth/Sex: Treating RN: October 13, 1950 (69 y.o. Male) Yevonne Pax Primary Care Shatonia Hoots: Kandis Fantasia, PETER Other Clinician: Referring Gracyn Allor: Treating Denetria Luevanos/Extender: Mikey College NK, PETER Weeks in Treatment: 0 Allergies Active Allergies No Known Drug Intolerances Allergy Notes Electronic Signature(s) Signed: 11/28/2019 5:49:00 PM By: Yevonne Pax RN Entered By: Yevonne Pax on 11/24/2019 09:35:39 -------------------------------------------------------------------------------- Arrival Information Details Patient Name: Date of Service: Chad Hall, Chad Boards MA S W. 11/24/2019 9:00 A M Medical Record Number: [id number] Patient Account Number: [id number] Date of Birth/Sex: Treating RN: 1950-08-23 (69 y.o. Male) Yevonne Pax Primary Care Baylen Buckner: Kandis Fantasia, PETER Other Clinician: Referring Jairus Tonne: Treating Bradley Handyside/Extender: Mikey College NK, PETER Weeks in Treatment: 0 Visit Information Patient Arrived: Ambulatory Arrival Time: 09:22 Accompanied By: self Transfer Assistance: None Patient Identification Verified: Yes Secondary Verification Process Completed: Yes Patient Requires Transmission-Based Precautions: No Patient Has Alerts: No Electronic Signature(s) Signed: 11/28/2019 5:49:00 PM By: Yevonne Pax RN Entered By: Yevonne Pax on 11/24/2019 09:34:44 -------------------------------------------------------------------------------- Clinic Level of Care Assessment Details Patient Name: Date of Service: Chad Scott [place]. 11/24/2019 9:00 A M Medical Record Number: [id number] Patient Account Number: [id number] Date of Birth/Sex: Treating RN: [birth year] (70 y.o. Male) Cherylin Mylar Primary Care Therma Lasure: Alanda Amass Other  Clinician: Referring Gracie Gupta: Treating Domingo Fuson/Extender: Mikey College NK, PETER Weeks in Treatment: 0 Clinic Level of Care Assessment Items TOOL 1 Quantity Score X- 1 0 Use when EandM and Procedure is performed on INITIAL visit ASSESSMENTS - Nursing Assessment / Reassessment X- 1 20 General Physical Exam (combine w/ comprehensive assessment (listed just below) when performed on new pt. evals) X- 1 25 Comprehensive Assessment (HX, ROS, Risk Assessments, Wounds Hx, etc.) ASSESSMENTS - Wound and Skin Assessment / Reassessment []  - 0 Dermatologic / Skin Assessment (not related to wound area) ASSESSMENTS - Ostomy and/or Continence Assessment and Care []  - 0 Incontinence Assessment and Management []  - 0 Ostomy Care Assessment and Management (repouching, etc.) PROCESS - Coordination of Care X - Simple Patient / Family Education for ongoing care 1 15 []  - 0 Complex (extensive) Patient / Family Education for ongoing care X- 1 10 Staff obtains Chiropractor, Records, T Results / Process Orders est []  - 0 Staff telephones HHA, Nursing Homes / Clarify orders / etc []  - 0 Routine Transfer to another Facility (non-emergent condition) []  - 0 Routine Hospital Admission (non-emergent condition) X- 1 15 New Admissions / Manufacturing engineer / Ordering NPWT Apligraf, etc. , []  - 0 Emergency Hospital Admission (emergent condition) PROCESS - Special Needs []  - 0 Pediatric / Minor Patient Management []  - 0 Isolation Patient Management []  - 0 Hearing / Language / Visual special needs []  - 0 Assessment of Community assistance (transportation, D/C planning, etc.) []  - 0 Additional assistance / Altered mentation []  - 0 Support Surface(s) Assessment (bed, cushion, seat, etc.) INTERVENTIONS - Miscellaneous []  - 0 External ear exam []  - 0 Patient Transfer (multiple staff / Nurse, adult / Similar devices) []  - 0 Simple Staple / Suture removal (25 or less) []  - 0 Complex Staple /  Suture removal (26 or more) []  - 0 Hypo/Hyperglycemic Management (do not check if billed separately) X- 1 15 Ankle / Brachial Index (ABI) - do not check if billed separately Has the patient been seen at the hospital within  the last three years: Yes Total Score: 100 Level Of Care: New/Established - Level 3 Electronic Signature(s) Signed: 11/24/2019 5:35:06 PM By: Cherylin Mylar Entered By: Cherylin Mylar on 11/24/2019 10:53:44 -------------------------------------------------------------------------------- Compression Therapy Details Patient Name: Date of Service: Chad Scott MA S W. 11/24/2019 9:00 A M Medical Record Number: [id number] Patient Account Number: [id number] Date of Birth/Sex: Treating RN: 1950-11-26 (69 y.o. Male) Cherylin Mylar Primary Care Yehudah Standing: Alanda Amass Other Clinician: Referring Tonatiuh Mallon: Treating Adonte Vanriper/Extender: Mikey College NK, PETER Weeks in Treatment: 0 Compression Therapy Performed for Wound Assessment: Wound #1 Left,Dorsal Foot Performed By: Clinician Shawn Stall, RN Compression Type: Three Layer Post Procedure Diagnosis Same as Pre-procedure Electronic Signature(s) Signed: 11/24/2019 5:35:06 PM By: Cherylin Mylar Entered By: Cherylin Mylar on 11/24/2019 10:36:28 -------------------------------------------------------------------------------- Compression Therapy Details Patient Name: Date of Service: Chad Hall, Chad Boards MA S W. 11/24/2019 9:00 A M Medical Record Number: [id number] Patient Account Number: [id number] Date of Birth/Sex: Treating RN: October 29, 1950 (69 y.o. Male) Cherylin Mylar Primary Care Cheryle Dark: Alanda Amass Other Clinician: Referring Christionna Poland: Treating Isobelle Tuckett/Extender: Mikey College NK, PETER Weeks in Treatment: 0 Compression Therapy Performed for Wound Assessment: Wound #4 Left Metatarsal head fourth Performed By: Clinician Shawn Stall, RN Compression Type: Three Layer Post Procedure  Diagnosis Same as Pre-procedure Electronic Signature(s) Signed: 11/24/2019 5:35:06 PM By: Cherylin Mylar Entered By: Cherylin Mylar on 11/24/2019 10:36:28 -------------------------------------------------------------------------------- Compression Therapy Details Patient Name: Date of Service: Chad Hall, Chad Boards MA S W. 11/24/2019 9:00 A M Medical Record Number: [id number] Patient Account Number: [id number] Date of Birth/Sex: Treating RN: 09/16/1950 (69 y.o. Male) Cherylin Mylar Primary Care Marranda Arakelian: Alanda Amass Other Clinician: Referring Ashwin Tibbs: Treating Viana Sleep/Extender: Mikey College NK, PETER Weeks in Treatment: 0 Compression Therapy Performed for Wound Assessment: Wound #5 Left Calcaneus Performed By: Clinician Shawn Stall, RN Compression Type: Three Layer Post Procedure Diagnosis Same as Pre-procedure Electronic Signature(s) Signed: 11/24/2019 5:35:06 PM By: Cherylin Mylar Entered By: Cherylin Mylar on 11/24/2019 10:36:28 -------------------------------------------------------------------------------- Encounter Discharge Information Details Patient Name: Date of Service: Chad Hall, Chad Boards MA S W. 11/24/2019 9:00 A M Medical Record Number: [id number] Patient Account Number: [id number] Date of Birth/Sex: Treating RN: May 31, 1950 (69 y.o. Male) Zenaida Deed Primary Care Arshawn Valdez: Kandis Fantasia, PETER Other Clinician: Referring Marlo Arriola: Treating Londyn Hotard/Extender: Mikey College NK, PETER Weeks in Treatment: 0 Encounter Discharge Information Items Post Procedure Vitals Discharge Condition: Stable Temperature (F): 98.4 Ambulatory Status: Ambulatory Pulse (bpm): 73 Discharge Destination: Home Respiratory Rate (breaths/min): 18 Transportation: Other Blood Pressure (mmHg): 174/80 Accompanied By: self Schedule Follow-up Appointment: Yes Clinical Summary of Care: Patient Declined Notes transportation service Electronic Signature(s) Signed:  11/24/2019 5:42:53 PM By: Zenaida Deed RN, BSN Entered By: Zenaida Deed on 11/24/2019 10:57:40 -------------------------------------------------------------------------------- Lower Extremity Assessment Details Patient Name: Date of Service: Chad Scott MA S W. 11/24/2019 9:00 A M Medical Record Number: [id number] Patient Account Number: [id number] Date of Birth/Sex: Treating RN: Mar 11, 1950 (68 y.o. Male) Yevonne Pax Primary Care Shephanie Romas: Alanda Amass Other Clinician: Referring Sharrod Achille: Treating Briget Shaheed/Extender: Mikey [employer] NK, PETER Weeks in Treatment: 0 Edema Assessment Assessed: [Left: No] [Right: No] E[Left: dema] [Right: :] Calf Left: Right: Point of Measurement: 47 cm From Medial Instep 38 cm Ankle Left: Right: Point of Measurement: 12 cm From Medial Instep 24 cm Vascular Assessment Blood Pressure: Brachial: [Left:174] Ankle: [Left:Dorsalis Pedis: 180 1.03] Electronic Signature(s) Signed: 11/28/2019 5:49:00 PM By: Yevonne Pax RN Entered By: Yevonne Pax on 11/24/2019 10:01:07 -------------------------------------------------------------------------------- Multi Wound Chart Details Patient Name: Date of Service: Chad Blalock  Hall, THO MA S W. 11/24/2019 9:00 A M Medical Record Number: [id number] Patient Account Number: [id number] Date of Birth/Sex: Treating RN: 30-May-1950 (69 y.o. Male) Cherylin Mylar Primary Care Javonne Dorko: Kandis Fantasia, PETER Other Clinician: Referring Tyee Vandevoorde: Treating Claudie Brickhouse/Extender: Mikey College NK, PETER Weeks in Treatment: 0 Vital Signs Height(in): 77 Pulse(bpm): 73 Weight(lbs): 210 Blood Pressure(mmHg): 174/80 Body Mass Index(BMI): 25 Temperature(F): 98.4 Respiratory Rate(breaths/min): 18 Photos: [1:No Photos Left, Dorsal Foot] [2:No Photos Left Amputation Site - Toe] [3:No Photos Left T Fourth oe] Wound Location: [1:Gradually Appeared] [2:Gradually Appeared] [3:Gradually Appeared] Wounding Event: [1:Diabetic  Wound/Ulcer of the Lower] [2:Diabetic Wound/Ulcer of the Lower] [3:Diabetic Wound/Ulcer of the Lower] Primary Etiology: [1:Extremity Hypertension, Type II Diabetes,] [2:Extremity Hypertension, Type II Diabetes,] [3:Extremity Hypertension, Type II Diabetes,] Comorbid History: [1:Neuropathy 08/17/2019] [2:Neuropathy 01/17/2019] [3:Neuropathy 09/17/2019] Date A cquired: [1:0] [2:0] [3:0] Weeks of Treatment: [1:Open] [2:Open] [3:Open] Wound Status: [1:11x9x0.1] [2:6x3x0.1] [3:5x1.2x0.1] Measurements L x W x D (cm) [1:77.754] [2:14.137] [3:4.712] A (cm) : rea [1:7.775] [2:1.414] [3:0.471] Volume (cm) : [1:Grade 2] [2:Grade 2] [3:Grade 2] Classification: [1:Medium] [2:Medium] [3:Medium] Exudate A mount: [1:Serosanguineous] [2:Serosanguineous] [3:Serosanguineous] Exudate Type: [1:red, brown] [2:red, brown] [3:red, brown] Exudate Color: [1:Small (1-33%)] [2:None Present (0%)] [3:None Present (0%)] Granulation A mount: [1:Pink] [2:N/A] [3:N/A] Granulation Quality: [1:Large (67-100%)] [2:Large (67-100%)] [3:Large (67-100%)] Necrotic A mount: [1:Fat Layer (Subcutaneous Tissue): Yes Fascia: No] [3:Fascia: No] Exposed Structures: [1:Fascia: No Tendon: No Muscle: No Joint: No Bone: No None] [2:Fat Layer (Subcutaneous Tissue): No Tendon: No Muscle: No Joint: No Bone: No None] [3:Fat Layer (Subcutaneous Tissue): No Tendon: No Muscle: No Joint: No Bone: No None] Epithelialization: [1:N/A] [2:N/A] [3:N/A] Debridement: [1:N/A] [2:N/A] [3:N/A] Pain Control: [1:N/A] [2:N/A] [3:N/A] Tissue Debrided: [1:N/A] [2:N/A] [3:N/A] Level: [1:N/A] [2:N/A] [3:N/A] Debridement A (sq cm): [1:rea N/A] [2:N/A] [3:N/A] Instrument: [1:N/A] [2:N/A] [3:N/A] Bleeding: [1:N/A] [2:N/A] [3:N/A] Hemostasis A chieved: [1:N/A] [2:N/A] [3:N/A] Procedural Pain: [1:N/A] [2:N/A] [3:N/A] Post Procedural Pain: Debridement Treatment Response: N/A [2:N/A] [3:N/A] Post Debridement Measurements L x N/A [2:N/A] [3:N/A] W x D (cm) [1:N/A]  [2:N/A] [3:N/A] Post Debridement Volume: (cm) [1:Compression Therapy] [2:N/A] [3:N/A] Wound Number: 4 5 N/A Photos: No Photos No Photos N/A Left Metatarsal head fourth Left Calcaneus N/A Wound Location: Gradually Appeared Gradually Appeared N/A Wounding Event: Diabetic Wound/Ulcer of the Lower Diabetic Wound/Ulcer of the Lower N/A Primary Etiology: Extremity Extremity Hypertension, Type II Diabetes, Hypertension, Type II Diabetes, N/A Comorbid History: Neuropathy Neuropathy 08/17/2019 09/17/2019 N/A Date Acquired: 0 0 N/A Weeks of Treatment: Open Open N/A Wound Status: 1.5x1.2x0.1 1.5x2.5x0.7 N/A Measurements L x W x D (cm) 1.414 2.945 N/A A (cm) : rea 0.141 2.062 N/A Volume (cm) : Grade 2 Grade 2 N/A Classification: Medium Medium N/A Exudate A mount: Serosanguineous Serosanguineous N/A Exudate Type: red, brown red, brown N/A Exudate Color: None Present (0%) Large (67-100%) N/A Granulation A mount: N/A Pink N/A Granulation Quality: Large (67-100%) Small (1-33%) N/A Necrotic A mount: Fat Layer (Subcutaneous Tissue): Yes Fat Layer (Subcutaneous Tissue): Yes N/A Exposed Structures: Fascia: No Fascia: No Tendon: No Tendon: No Muscle: No Muscle: No Joint: No Joint: No Bone: No Bone: No None None N/A Epithelialization: Debridement - Excisional N/A N/A Debridement: Pre-procedure Verification/Time Out 10:35 N/A N/A Taken: Other N/A N/A Pain Control: Callus, Subcutaneous N/A N/A Tissue Debrided: Skin/Subcutaneous Tissue N/A N/A Level: 1.8 N/A N/A Debridement A (sq cm): rea Blade N/A N/A Instrument: Moderate N/A N/A Bleeding: Silver Nitrate N/A N/A Hemostasis A chieved: 0 N/A N/A Procedural Pain: 0 N/A N/A Post Procedural Pain: Procedure  was tolerated well N/A N/A Debridement Treatment Response: 1.5x1.2x0.1 N/A N/A Post Debridement Measurements L x W x D (cm) 0.141 N/A N/A Post Debridement Volume: (cm) Compression Therapy Compression Therapy  N/A Procedures Performed: Debridement Treatment Notes Wound #1 (Left, Dorsal Foot) 2. Periwound Care Moisturizing lotion 3. Primary Dressing Applied Calcium Alginate Ag 4. Secondary Dressing Dry Gauze Foam Heel Cup 6. Support Layer Applied 3 layer compression wrap 7. Footwear/Offloading device applied Surgical shoe Wound #2 (Left Amputation Site - Toe) 2. Periwound Care Moisturizing lotion 3. Primary Dressing Applied Calcium Alginate Ag 4. Secondary Dressing Dry Gauze Foam Heel Cup 6. Support Layer Applied 3 layer compression wrap 7. Footwear/Offloading device applied Surgical shoe Wound #3 (Left Toe Fourth) 2. Periwound Care Moisturizing lotion 3. Primary Dressing Applied Calcium Alginate Ag 4. Secondary Dressing Dry Gauze Foam Heel Cup 6. Support Layer Applied 3 layer compression wrap 7. Footwear/Offloading device applied Surgical shoe Wound #4 (Left Metatarsal head fourth) 2. Periwound Care Moisturizing lotion 3. Primary Dressing Applied Calcium Alginate Ag 4. Secondary Dressing Dry Gauze Foam Heel Cup 6. Support Layer Applied 3 layer compression wrap 7. Footwear/Offloading device applied Surgical shoe Wound #5 (Left Calcaneus) 2. Periwound Care Moisturizing lotion 3. Primary Dressing Applied Calcium Alginate Ag 4. Secondary Dressing Dry Gauze Foam Heel Cup 6. Support Layer Applied 3 layer compression wrap 7. Footwear/Offloading device applied Surgical shoe Electronic Signature(s) Signed: 11/24/2019 5:35:06 PM By: Cherylin Mylar Signed: 11/27/2019 5:03:49 PM By: Baltazar Najjar MD Entered By: Baltazar Najjar on 11/24/2019 11:02:21 -------------------------------------------------------------------------------- Multi-Disciplinary Care Plan Details Patient Name: Date of Service: Chad Hall, Chad Boards MA S W. 11/24/2019 9:00 A M Medical Record Number: [id number] Patient Account Number: [id number] Date of Birth/Sex: Treating RN: 1951/01/24 (69  y.o. Male) Cherylin Mylar Primary Care Layali Freund: Kandis Fantasia, PETER Other Clinician: Referring Kwanza Cancelliere: Treating Makayle Krahn/Extender: Mikey College NK, PETER Weeks in Treatment: 0 Active Inactive Nutrition Nursing Diagnoses: Impaired glucose control: actual or potential Goals: Patient/caregiver verbalizes understanding of need to maintain therapeutic glucose control per primary care physician Date Initiated: 11/24/2019 Target Resolution Date: 12/22/2019 Goal Status: Active Interventions: Provide education on elevated blood sugars and impact on wound healing Notes: Orientation to the Wound Care Program Nursing Diagnoses: Knowledge deficit related to the wound healing center program Goals: Patient/caregiver will verbalize understanding of the Wound Healing Center Program Date Initiated: 11/24/2019 Target Resolution Date: 12/22/2019 Goal Status: Active Interventions: Provide education on orientation to the wound center Notes: Wound/Skin Impairment Nursing Diagnoses: Knowledge deficit related to ulceration/compromised skin integrity Goals: Ulcer/skin breakdown will have a volume reduction of 50% by week 8 Date Initiated: 11/24/2019 Target Resolution Date: 12/22/2019 Goal Status: Active Interventions: Provide education on ulcer and skin care Notes: Electronic Signature(s) Signed: 11/24/2019 5:35:06 PM By: Cherylin Mylar Entered By: Cherylin Mylar on 11/24/2019 10:10:20 -------------------------------------------------------------------------------- Pain Assessment Details Patient Name: Date of Service: Chad Scott MA S W. 11/24/2019 9:00 A M Medical Record Number: [id number] Patient Account Number: [id number] Date of Birth/Sex: Treating RN: 24-Apr-1950 (69 y.o. Male) Yevonne Pax Primary Care Lena Gores: Kandis Fantasia, Theron Arista Other Clinician: Referring Faiza Bansal: Treating Godfrey Tritschler/Extender: Mikey College NK, PETER Weeks in Treatment: 0 Active Problems Location of  Pain Severity and Description of Pain Patient Has Paino No Site Locations Pain Management and Medication Current Pain Management: Electronic Signature(s) Signed: 11/28/2019 5:49:00 PM By: Yevonne Pax RN Entered By: Yevonne Pax on 11/24/2019 10:07:05 -------------------------------------------------------------------------------- Patient/Caregiver Education Details Patient Name: Date of Service: Chad Hall 10/8/2021andnbsp9:00 A M Medical Record Number: [id number] Patient Account  Number: [id number] Date of Birth/Gender: Treating RN: [birth year](69 y.o. Male) Cherylin Mylarwiggins, Shannon Primary Care Physician: Kandis FantasiaFRA NK, PETER Other Clinician: Referring Physician: Treating Physician/Extender: Linton Rumpobson, Michael FRA NK, PETER Weeks in Treatment: 0 Education Assessment Education Provided To: Patient Education Topics Provided Elevated Blood Sugar/ Impact on Healing: Handouts: Elevated Blood Sugars: How Do They Affect Wound Healing Methods: Explain/Verbal Responses: State content correctly Welcome T [facility]: o Handouts: Welcome T [facility] o Methods: Explain/Verbal Responses: State content correctly Wound/Skin Impairment: Handouts: Caring for Your Ulcer Methods: Explain/Verbal Responses: State content correctly Electronic Signature(s) Signed: 11/24/2019 5:35:06 PM By: Cherylin Mylarwiggins, Shannon Entered By: Cherylin Mylarwiggins, Shannon on 11/24/2019 10:39:59 -------------------------------------------------------------------------------- Wound Assessment Details Patient Name: Date of Service: Chad Hall, THO MA S W. 11/24/2019 9:00 A M Medical Record Number: [id number] Patient Account Number: [id number] Date of Birth/Sex: Treating RN: [birth year](69 y.o. Male) Yevonne PaxEpps, Carrie Primary Care Ivylynn Hoppes: Kandis FantasiaFRA NK, Theron AristaPETER Other Clinician: Referring Savana Spina: Treating Cheral Cappucci/Extender: Mikey Collegeobson, Michael FRA NK, PETER Weeks in Treatment: 0 Wound Status Wound Number: 1 Primary  Etiology: Diabetic Wound/Ulcer of the Lower Extremity Wound Location: Left, Dorsal Foot Wound Status: Open Wounding Event: Gradually Appeared Comorbid History: Hypertension, Type II Diabetes, Neuropathy Date Acquired: 08/17/2019 Weeks Of Treatment: 0 Clustered Wound: No Photos Photo Uploaded By: Benjaman KindlerJones, Dedrick on 11/27/2019 11:23:48 Wound Measurements Length: (cm) 11 % Redu Width: (cm) 9 % Redu Depth: (cm) 0.1 Epithe Area: (cm) 77.754 Tunne Volume: (cm) 7.775 Under ction in Area: ction in Volume: lialization: None ling: No mining: No Wound Description Classification: Grade 2 Foul O Exudate Amount: Medium Slough Exudate Type: Serosanguineous Exudate Color: red, brown dor After Cleansing: No /Fibrino Yes Wound Bed Granulation Amount: Small (1-33%) Exposed Structure Granulation Quality: Pink Fascia Exposed: No Necrotic Amount: Large (67-100%) Fat Layer (Subcutaneous Tissue) Exposed: Yes Necrotic Quality: Adherent Slough Tendon Exposed: No Muscle Exposed: No Joint Exposed: No Bone Exposed: No Treatment Notes Wound #1 (Left, Dorsal Foot) 2. Periwound Care Moisturizing lotion 3. Primary Dressing Applied Calcium Alginate Ag 4. Secondary Dressing Dry Gauze Foam Heel Cup 6. Support Layer Applied 3 layer compression wrap 7. Footwear/Offloading device applied Surgical shoe Electronic Signature(s) Signed: 11/28/2019 5:49:00 PM By: Yevonne PaxEpps, Carrie RN Entered By: Yevonne PaxEpps, Carrie on 11/24/2019 10:02:06 -------------------------------------------------------------------------------- Wound Assessment Details Patient Name: Date of Service: Chad Hall, THO MA S W. 11/24/2019 9:00 A M Medical Record Number: [id number] Patient Account Number: [id number] Date of Birth/Sex: Treating RN: [birth year](69 y.o. Male) Yevonne PaxEpps, Carrie Primary Care Varick Keys: Kandis FantasiaFRA NK, PETER Other Clinician: Referring Rakwon Letourneau: Treating Mikaya Bunner/Extender: Mikey Collegeobson, Michael FRA NK, PETER Weeks in Treatment:  0 Wound Status Wound Number: 2 Primary Etiology: Diabetic Wound/Ulcer of the Lower Extremity Wound Location: Left Amputation Site - Toe Wound Status: Open Wounding Event: Gradually Appeared Comorbid History: Hypertension, Type II Diabetes, Neuropathy Date Acquired: 01/17/2019 Weeks Of Treatment: 0 Clustered Wound: No Photos Photo Uploaded By: Benjaman KindlerJones, Dedrick on 11/27/2019 11:23:27 Wound Measurements Length: (cm) 6 Width: (cm) 3 Depth: (cm) 0.1 Area: (cm) 14.137 Volume: (cm) 1.414 % Reduction in Area: % Reduction in Volume: Epithelialization: None Tunneling: No Undermining: No Wound Description Classification: Grade 2 Exudate Amount: Medium Exudate Type: Serosanguineous Exudate Color: red, brown Foul Odor After Cleansing: No Slough/Fibrino Yes Wound Bed Granulation Amount: None Present (0%) Exposed Structure Necrotic Amount: Large (67-100%) Fascia Exposed: No Necrotic Quality: Adherent Slough Fat Layer (Subcutaneous Tissue) Exposed: No Tendon Exposed: No Muscle Exposed: No Joint Exposed: No Bone Exposed: No Treatment Notes Wound #2 (Left Amputation Site - Toe) 2. Periwound Care  Moisturizing lotion 3. Primary Dressing Applied Calcium Alginate Ag 4. Secondary Dressing Dry Gauze Foam Heel Cup 6. Support Layer Applied 3 layer compression wrap 7. Footwear/Offloading device applied Surgical shoe Electronic Signature(s) Signed: 11/28/2019 5:49:00 PM By: Yevonne Pax RN Entered By: Yevonne Pax on 11/24/2019 10:03:28 -------------------------------------------------------------------------------- Wound Assessment Details Patient Name: Date of Service: Chad Scott MA S W. 11/24/2019 9:00 A M Medical Record Number: [id number] Patient Account Number: [id number] Date of Birth/Sex: Treating RN: 09/29/1950 (69 y.o. Male) Yevonne Pax Primary Care Zale Marcotte: Kandis Fantasia, PETER Other Clinician: Referring Levy Cedano: Treating Sebastiana Wuest/Extender: Mikey College NK,  PETER Weeks in Treatment: 0 Wound Status Wound Number: 3 Primary Etiology: Diabetic Wound/Ulcer of the Lower Extremity Wound Location: Left T Fourth oe Wound Status: Open Wounding Event: Gradually Appeared Comorbid History: Hypertension, Type II Diabetes, Neuropathy Date Acquired: 09/17/2019 Weeks Of Treatment: 0 Clustered Wound: No Photos Photo Uploaded By: Benjaman Kindler on 11/27/2019 11:23:28 Wound Measurements Length: (cm) 5 Width: (cm) 1.2 Depth: (cm) 0.1 Area: (cm) 4.712 Volume: (cm) 0.471 % Reduction in Area: % Reduction in Volume: Epithelialization: None Tunneling: No Undermining: No Wound Description Classification: Grade 2 Exudate Amount: Medium Exudate Type: Serosanguineous Exudate Color: red, brown Foul Odor After Cleansing: No Slough/Fibrino Yes Wound Bed Granulation Amount: None Present (0%) Exposed Structure Necrotic Amount: Large (67-100%) Fascia Exposed: No Necrotic Quality: Adherent Slough Fat Layer (Subcutaneous Tissue) Exposed: No Tendon Exposed: No Muscle Exposed: No Joint Exposed: No Bone Exposed: No Treatment Notes Wound #3 (Left Toe Fourth) 2. Periwound Care Moisturizing lotion 3. Primary Dressing Applied Calcium Alginate Ag 4. Secondary Dressing Dry Gauze Foam Heel Cup 6. Support Layer Applied 3 layer compression wrap 7. Footwear/Offloading device applied Surgical shoe Electronic Signature(s) Signed: 11/28/2019 5:49:00 PM By: Yevonne Pax RN Entered By: Yevonne Pax on 11/24/2019 10:04:24 -------------------------------------------------------------------------------- Wound Assessment Details Patient Name: Date of Service: Chad Scott MA S W. 11/24/2019 9:00 A M Medical Record Number: [id number] Patient Account Number: [id number] Date of Birth/Sex: Treating RN: 09-12-1950 (69 y.o. Male) Yevonne Pax Primary Care Rosio Weiss: Kandis Fantasia, PETER Other Clinician: Referring Tammey Deeg: Treating Jamerion Cabello/Extender: Mikey College  NK, PETER Weeks in Treatment: 0 Wound Status Wound Number: 4 Primary Etiology: Diabetic Wound/Ulcer of the Lower Extremity Wound Location: Left Metatarsal head fourth Wound Status: Open Wounding Event: Gradually Appeared Comorbid History: Hypertension, Type II Diabetes, Neuropathy Date Acquired: 08/17/2019 Weeks Of Treatment: 0 Clustered Wound: No Photos Photo Uploaded By: Benjaman Kindler on 11/27/2019 11:22:55 Wound Measurements Length: (cm) 1.5 Width: (cm) 1.2 Depth: (cm) 0.1 Area: (cm) 1.414 Volume: (cm) 0.141 % Reduction in Area: % Reduction in Volume: Epithelialization: None Tunneling: No Undermining: No Wound Description Classification: Grade 2 Exudate Amount: Medium Exudate Type: Serosanguineous Exudate Color: red, brown Foul Odor After Cleansing: No Slough/Fibrino Yes Wound Bed Granulation Amount: None Present (0%) Exposed Structure Necrotic Amount: Large (67-100%) Fascia Exposed: No Necrotic Quality: Adherent Slough Fat Layer (Subcutaneous Tissue) Exposed: Yes Tendon Exposed: No Muscle Exposed: No Joint Exposed: No Bone Exposed: No Treatment Notes Wound #4 (Left Metatarsal head fourth) 2. Periwound Care Moisturizing lotion 3. Primary Dressing Applied Calcium Alginate Ag 4. Secondary Dressing Dry Gauze Foam Heel Cup 6. Support Layer Applied 3 layer compression wrap 7. Footwear/Offloading device applied Surgical shoe Electronic Signature(s) Signed: 11/28/2019 5:49:00 PM By: Yevonne Pax RN Entered By: Yevonne Pax on 11/24/2019 10:05:22 -------------------------------------------------------------------------------- Wound Assessment Details Patient Name: Date of Service: Chad Scott MA S W. 11/24/2019 9:00 A M Medical Record Number: [id number] Patient Account Number: [id number] Date of  Birth/Sex: Treating RN: 09/12/1950 (69 y.o. Male) Epps, Carrie Primary Care Brisa Auth: Kandis Fantasia, PETER Other Clinician: Referring Jquan Egelston: Treating  Maylea Soria/Extender: Mikey College NK, PETER Weeks in Treatment: 0 Wound Status Wound Number: 5 Primary Etiology: Diabetic Wound/Ulcer of the Lower Extremity Wound Location: Left Calcaneus Wound Status: Open Wounding Event: Gradually Appeared Comorbid History: Hypertension, Type II Diabetes, Neuropathy Date Acquired: 09/17/2019 Weeks Of Treatment: 0 Clustered Wound: No Photos Photo Uploaded By: Benjaman Kindler on 11/27/2019 11:22:56 Wound Measurements Length: (cm) 1.5 Width: (cm) 2.5 Depth: (cm) 0.7 Area: (cm) 2.945 Volume: (cm) 2.062 % Reduction in Area: % Reduction in Volume: Epithelialization: None Tunneling: No Undermining: No Wound Description Classification: Grade 2 Exudate Amount: Medium Exudate Type: Serosanguineous Exudate Color: red, brown Foul Odor After Cleansing: No Slough/Fibrino Yes Wound Bed Granulation Amount: Large (67-100%) Exposed Structure Granulation Quality: Pink Fascia Exposed: No Necrotic Amount: Small (1-33%) Fat Layer (Subcutaneous Tissue) Exposed: Yes Necrotic Quality: Adherent Slough Tendon Exposed: No Muscle Exposed: No Joint Exposed: No Bone Exposed: No Treatment Notes Wound #5 (Left Calcaneus) 2. Periwound Care Moisturizing lotion 3. Primary Dressing Applied Calcium Alginate Ag 4. Secondary Dressing Dry Gauze Foam Heel Cup 6. Support Layer Applied 3 layer compression wrap 7. Footwear/Offloading device applied Surgical shoe Electronic Signature(s) Signed: 11/28/2019 5:49:00 PM By: Yevonne Pax RN Entered By: Yevonne Pax on 11/24/2019 10:06:28 -------------------------------------------------------------------------------- Vitals Details Patient Name: Date of Service: Chad Hall, Chad Boards MA S W. 11/24/2019 9:00 A M Medical Record Number: [id number] Patient Account Number: [id number] Date of Birth/Sex: Treating RN: Oct 10, 1950 (69 y.o. Male) Yevonne Pax Primary Care Adellyn Capek: Kandis Fantasia, PETER Other Clinician: Referring  Jaelee Laughter: Treating Daviana Haymaker/Extender: Mikey College NK, PETER Weeks in Treatment: 0 Vital Signs Time Taken: 09:34 Temperature (F): 98.4 Height (in): 77 Pulse (bpm): 73 Source: Stated Respiratory Rate (breaths/min): 18 Weight (lbs): 210 Blood Pressure (mmHg): 174/80 Body Mass Index (BMI): 24.9 Reference Range: 80 - 120 mg / dl Electronic Signature(s) Signed: 11/28/2019 5:49:00 PM By: Yevonne Pax RN Entered By: Yevonne Pax on 11/24/2019 09:35:30

## 2019-11-28 NOTE — Progress Notes (Signed)
IZEAH, VOSSLER ([id number]) Visit Report for 11/24/2019 Abuse/Suicide Risk Screen Details Patient Name: Date of Service: Chad Hall. 11/24/2019 9:00 A M Medical Record Number: [id number] Patient Account Number: [id number] Date of Birth/Sex: Treating RN: 11-16-1950 (69 y.o. Male) Chad Hall Primary Care Gemini Beaumier: Kandis Fantasia, PETER Other Clinician: Referring Rindi Beechy: Treating Percilla Tweten/Extender: Chad Hall, PETER Weeks in Treatment: 0 Abuse/Suicide Risk Screen Items Answer ABUSE RISK SCREEN: Has anyone close to you tried to hurt or harm you recentlyo No Do you feel uncomfortable with anyone in your familyo No Has anyone forced you do things that you didnt want to doo No Electronic Signature(s) Signed: 11/28/2019 5:49:00 PM By: Chad Pax RN Entered By: Chad Hall on 11/24/2019 09:40:53 -------------------------------------------------------------------------------- Activities of Daily Living Details Patient Name: Date of Service: Chad Hall. 11/24/2019 9:00 A M Medical Record Number: [id number] Patient Account Number: [id number] Date of Birth/Sex: Treating RN: [birth year] 69 y.o. Male) Chad Hall Primary Care Abdullah Rizzi: Kandis Fantasia, PETER Other Clinician: Referring Miya Luviano: Treating Jamice Carreno/Extender: Chad Hall, PETER Weeks in Treatment: 0 Activities of Daily Living Items Answer Activities of Daily Living (Please select one for each item) Drive Automobile Not Able T Medications ake Completely Able Use T elephone Completely Able Care for Appearance Completely Able Use T oilet Completely Able Bath / Shower Completely Able Dress Self Completely Able Feed Self Completely Able Walk Completely Able Get In / Out Bed Completely Able Housework Completely Able Prepare Meals Completely Able Handle Money Completely Able Shop for Self Completely Able Electronic Signature(s) Signed: 11/28/2019 5:49:00 PM By: Chad Pax RN Entered By:  Chad Hall on 11/24/2019 09:41:50 -------------------------------------------------------------------------------- Education Screening Details Patient Name: Date of Service: Chad Hall, Barkley Boards MA S W. 11/24/2019 9:00 A M Medical Record Number: [id number] Patient Account Number: [id number] Date of Birth/Sex: Treating RN: 1950/10/22 (69 y.o. Male) Chad Hall Primary Care Chad Hall: Kandis Fantasia, PETER Other Clinician: Referring Chad Hall: Treating Chad Hall/Extender: Chad Hall, PETER Weeks in Treatment: 0 Primary Learner Assessed: Patient Learning Preferences/Education Level/Primary Language Learning Preference: Explanation Highest Education Level: High School Preferred Language: English Cognitive Barrier Language Barrier: No Translator Needed: No Memory Deficit: No Emotional Barrier: No Cultural/Religious Beliefs Affecting Medical Care: No Physical Barrier Impaired Vision: Yes Legally Blind Impaired Hearing: No Decreased Hand dexterity: No Knowledge/Comprehension Knowledge Level: Medium Comprehension Level: High Ability to understand written instructions: High Ability to understand verbal instructions: High Motivation Anxiety Level: Calm Cooperation: Cooperative Education Importance: Acknowledges Need Interest in Health Problems: Asks Questions Perception: Coherent Willingness to Engage in Self-Management High Activities: Readiness to Engage in Self-Management High Activities: Electronic Signature(s) Signed: 11/28/2019 5:49:00 PM By: Chad Pax RN Entered By: Chad Hall on 11/24/2019 09:42:29 -------------------------------------------------------------------------------- Fall Risk Assessment Details Patient Name: Date of Service: Chad Hall, Barkley Boards MA S W. 11/24/2019 9:00 A M Medical Record Number: [id number] Patient Account Number: [id number] Date of Birth/Sex: Treating RN: 1950-07-25 (69 y.o. Male) Chad Hall Primary Care Haleema Vanderheyden: Kandis Fantasia, PETER Other  Clinician: Referring Chad Hall: Treating Chad Hall/Extender: Chad Hall, PETER Weeks in Treatment: 0 Fall Risk Assessment Items Have you had 2 or more falls in the last 12 monthso 0 No Have you had any fall that resulted in injury in the last 12 monthso 0 No FALLS RISK SCREEN History of falling - immediate or within 3 months 0 No Secondary diagnosis (Do you have 2 or more medical diagnoseso) 0 No Ambulatory aid None/bed rest/wheelchair/nurse 0 No Crutches/cane/walker 0 No Furniture 0 No Intravenous  therapy Access/Saline/Heparin Lock 0 No Gait/Transferring Normal/ bed rest/ wheelchair 0 No Weak (short steps with or without shuffle, stooped but able to lift head while walking, may seek 0 No support from furniture) Impaired (short steps with shuffle, may have difficulty arising from chair, head down, impaired 0 No balance) Mental Status Oriented to own ability 0 No Electronic Signature(s) Signed: 11/28/2019 5:49:00 PM By: Chad Pax RN Entered By: Chad Hall on 11/24/2019 09:43:16 -------------------------------------------------------------------------------- Foot Assessment Details Patient Name: Date of Service: Chad Scott MA S W. 11/24/2019 9:00 A M Medical Record Number: [id number] Patient Account Number: [id number] Date of Birth/Sex: Treating RN: 20-Nov-1950 (69 y.o. Male) Chad Hall Primary Care Trenese Haft: Kandis Fantasia, Chad Hall Other Clinician: Referring Chad Hall: Treating Chad Hall/Extender: Chad Hall, PETER Weeks in Treatment: 0 Foot Assessment Items Site Locations + = Sensation present, - = Sensation absent, C = Callus, U = Ulcer R = Redness, W = Warmth, M = Maceration, PU = Pre-ulcerative lesion F = Fissure, S = Swelling, D = Dryness Assessment Right: Left: Other Deformity: No Yes Prior Foot Ulcer: No Yes Prior Amputation: No No Charcot Joint: No No Ambulatory Status: Ambulatory Without Help Gait: Steady Electronic Signature(s) Signed:  11/28/2019 5:49:00 PM By: Chad Pax RN Entered By: Chad Hall on 11/24/2019 09:49:48 -------------------------------------------------------------------------------- Nutrition Risk Screening Details Patient Name: Date of Service: Chad Scott MA S W. 11/24/2019 9:00 A M Medical Record Number: [id number] Patient Account Number: [id number] Date of Birth/Sex: Treating RN: 04-17-1950 (69 y.o. Male) Chad Hall Primary Care Hebe Merriwether: Alanda Amass Other Clinician: Referring Daimian Sudberry: Treating Ataya Murdy/Extender: Chad Hall, PETER Weeks in Treatment: 0 Height (in): 77 Weight (lbs): 210 Body Mass Index (BMI): 24.9 Nutrition Risk Screening Items Score Screening NUTRITION RISK SCREEN: I have an illness or condition that made me change the kind and/or amount of food I eat 0 No I eat fewer than two meals per day 0 No I eat few fruits and vegetables, or milk products 0 No I have three or more drinks of beer, liquor or wine almost every day 0 No I have tooth or mouth problems that make it hard for me to eat 0 No I don't always have enough money to buy the food I need 0 No I eat alone most of the time 0 No I take three or more different prescribed or over-the-counter drugs a day 1 Yes Without wanting to, I have lost or gained 10 pounds in the last six months 0 No I am not always physically able to shop, cook and/or feed myself 0 No Nutrition Protocols Good Risk Protocol 0 No interventions needed Moderate Risk Protocol High Risk Proctocol Risk Level: Good Risk Score: 1 Electronic Signature(s) Signed: 11/28/2019 5:49:00 PM By: Chad Pax RN Entered By: Chad Hall on 11/24/2019 09:43:38

## 2019-11-28 NOTE — Progress Notes (Signed)
Chad Hall, Chad Hall ([id number]) Visit Report for 11/24/2019 Chief Complaint Document Details Patient Name: Date of Service: Chad Hall [place]. 11/24/2019 9:00 A M Medical Record Number: [id number] Patient Account Number: [id number] Date of Birth/Sex: Treating RN: 1951-01-17 (69 y.o. Male) Kela Millin Primary Care Provider: Lyndal Pulley Other Clinician: Referring Provider: Treating Provider/Extender: Francella Solian Weeks in Treatment: 0 Information Obtained from: Patient Chief Complaint 11/24/2019; patient is here for review of multiple wound areas on his left foot Electronic Signature(s) Signed: 11/27/2019 5:03:49 PM By: Linton Ham MD Entered By: Linton Ham on 11/24/2019 11:03:00 -------------------------------------------------------------------------------- Debridement Details Patient Name: Date of Service: Chad Hall [place], [name] MA S W. 11/24/2019 9:00 A M Medical Record Number: [id number] Patient Account Number: [id number] Date of Birth/Sex: Treating RN: 06-May-1950 (69 y.o. Male) Kela Millin Primary Care Provider: Lyndal Pulley Other Clinician: Referring Provider: Treating Provider/Extender: Burnis Kingfisher NK, PETER Weeks in Treatment: 0 Debridement Performed for Assessment: Wound #4 Left Metatarsal head fourth Performed By: Physician Chad Dillon., MD Debridement Type: Debridement Severity of Tissue Pre Debridement: Fat layer exposed Level of Consciousness (Pre-procedure): Awake and Alert Pre-procedure Verification/Time Out Yes - 10:35 Taken: Start Time: 10:35 Pain Control: Other : benzocaine, 20% T Area Debrided (L x W): otal 1.5 (cm) x 1.2 (cm) = 1.8 (cm) Tissue and other material debrided: Viable, Non-Viable, Callus, Subcutaneous Level: Skin/Subcutaneous Tissue Debridement Description: Excisional Instrument: Blade Bleeding: Moderate Hemostasis Achieved: Silver Nitrate End Time: 10:36 Procedural Pain: 0 Post Procedural  Pain: 0 Response to Treatment: Procedure was tolerated well Level of Consciousness (Post- Awake and Alert procedure): Post Debridement Measurements of Total Wound Length: (cm) 1.5 Width: (cm) 1.2 Depth: (cm) 0.1 Volume: (cm) 0.141 Character of Wound/Ulcer Post Debridement: Improved Severity of Tissue Post Debridement: Fat layer exposed Post Procedure Diagnosis Same as Pre-procedure Electronic Signature(s) Signed: 11/24/2019 5:35:06 PM By: Kela Millin Signed: 11/27/2019 5:03:49 PM By: Linton Ham MD Entered By: Linton Ham on 11/24/2019 11:02:38 -------------------------------------------------------------------------------- HPI Details Patient Name: Date of Service: Chad Bishop MS, [name] MA S W. 11/24/2019 9:00 A M Medical Record Number: [id number] Patient Account Number: [id number] Date of Birth/Sex: Treating RN: Jun 24, 1950 (69 y.o. Male) Kela Millin Primary Care Provider: Lyndal Pulley Other Clinician: Referring Provider: Treating Provider/Extender: Burnis Kingfisher NK, PETER Weeks in Treatment: 0 History of Present Illness HPI Description: ADMISSION 11/24/2019 Chad Hall is a 70 year old man with type 2 diabetes well controlled peripheral neuropathy and blindness secondary to remote bilateral retinal detachments I believe. His problem with regards to his left foot began it in November 2020. He developed osteomyelitis of his left toe/MTP and underwent a left third ray amputation by Dr. Sharol Given. He developed a wound dehiscence by early December and he was followed for this by Dr. Jess Barters office for most of this year. Most recently he has been using silver alginate. Trental and nitroglycerin patches have also been used. Early in September it was determined that the third grade dehiscence had closed over but for the first time mention of a wound on the fourth toe, fourth met head and left plantar heel. Dr. Sharol Given had talked about a transmetatarsal amputation but that  certainly would not address the very significant area on his heel now and also there was some conversation about a BKA. The patient is here to see what can be done to save his foot The patient is a well-controlled type II diabetic on oral agents. He also has severe chronic venous insufficiency, Marfan's, hyperlipidemia, thoracic aortic  aneurysm, blindness. 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 He was on his feet for quite a period of time but he tells me he is not working currently. His ABI in our clinic was 1.03 on the left he was not felt in Dr. Jess Barters office based on bedside Dopplers to have arterial insufficiency. This seems quite correct Electronic Signature(s) Signed: 11/27/2019 5:03:49 PM By: Linton Ham MD Entered By: Linton Ham on 11/24/2019 11:11:06 -------------------------------------------------------------------------------- Physical Exam Details Patient Name: Date of Service: Chad Hall, [name] MA S W. 11/24/2019 9:00 A M Medical Record Number: [id number] Patient Account Number: [id number] Date of Birth/Sex: Treating RN: [birth year] (69 y.o. Male) Kela Millin Primary Care Provider: Myrle Sheng, PETER Other Clinician: Referring Provider: Treating Provider/Extender: Burnis Kingfisher NK, PETER Weeks in Treatment: 0 Constitutional Patient is hypertensive.. Pulse regular and within target range for patient.Marland Kitchen Respirations regular, non-labored and within target range.. Temperature is normal and within the target range for the patient.Marland Kitchen Appears in no distress. Eyes . Patient is blind wears sunglasses. Cardiovascular Nice popliteal pulses easily palpable. Pedal pulses palpable at the dorsalis pedis and posterior tibial. Integumentary (Hair, Skin) Patient has severe skin changes of chronic venous insufficiency in the distal lower leg extending into his dorsal foot. Neurological Diabetic insensate neuropathy. Psychiatric appears at normal  baseline. Notes Wound exam; there are several areas of concern 1. Firstly the original third ray amputation site is still open. I think this area is complicated by a probably continuous drainage related to chronic venous insufficiency and lymphedema extending into the left dorsal foot. The left foot skin is macerated and looks as though it is threatening to open as well. This is a linear area a lot of wet macerated tissue over the wound which we removed with gauze. 2. He does indeed have a probing area on the base of the left fourth toe this goes precariously close to bone if not on bone. There is no purulent drainage. Nothing I really thought needed culturing 3. He has probing area over the left fourth met head. This had hyper granulation tissue coming out of the wound. This type of presentation always worries me for underlying infection perhaps drainage. The hyper granulated tissue was removed hemostasis with silver nitrate 4. He has a deep area on the plantar left heel with significant undermining. This is not go to bone but nevertheless is a concerning wound vis--vis the possibility of underlying infection 5. He has multiple areas over the left dorsal foot which I think are related to chronic venous hypertension secondary lymphedema and chronic drainage Electronic Signature(s) Signed: 11/27/2019 5:03:49 PM By: Linton Ham MD Entered By: Linton Ham on 11/24/2019 11:10:27 -------------------------------------------------------------------------------- Physician Orders Details Patient Name: Date of Service: Chad Hall, [name] MA S W. 11/24/2019 9:00 A M Medical Record Number: [id number] Patient Account Number: [id number] Date of Birth/Sex: Treating RN: 10-Jan-1951 (69 y.o. Male) Kela Millin Primary Care Provider: Lyndal Pulley Other Clinician: Referring Provider: Treating Provider/Extender: Burnis Kingfisher NK, PETER Weeks in Treatment: 0 Verbal / Phone Orders: No Diagnosis  Coding Follow-up Appointments Return Appointment in 1 week. Nurse Visit: - Tuesday Dressing Change Frequency Do not change entire dressing for one week. Wound Cleansing Wound #1 Left,Dorsal Foot May shower with protection. - cast protector Primary Wound Dressing Wound #1 Left,Dorsal Foot lginate with Silver - place silver alginate to all areas of maceration to foot Calcium A Wound #2 Left Amputation Site - Toe Calcium Alginate with Silver Wound #  3 Left T Fourth oe Calcium Alginate with Silver Wound #4 Left Metatarsal head fourth Calcium Alginate with Silver Wound #5 Left Calcaneus Calcium Alginate with Silver Secondary Dressing Wound #1 Left,Dorsal Foot Dry Gauze Heel Cup Other: - use netting and stockinnette to cover all toes so patient does not have to change dressing Edema Control 3 Layer Compression System - Left Lower Extremity Off-Loading Open toe surgical shoe to: Radiology MRI, lower extremity W/WO Contrast, Left Foot - MRI W/WO Contrast to Left Foot, non-healing wounds. ICD10: Ell.621 and [id number] Electronic Signature(s) Signed: 11/24/2019 5:35:06 PM By: Kela Millin Signed: 11/27/2019 5:03:49 PM By: Linton Ham MD Entered By: Kela Millin on 11/24/2019 10:47:49 Prescription 11/24/2019 -------------------------------------------------------------------------------- Chad Benes MD Patient Name: Provider: 15-Jan-1951 [id number] Date of Birth: NPI#: Male [id number] Sex: DEA #: [phone] [id number] Phone #: License #: [place] Patient Address: [place] [address] [place], [place] [address] [place], [place] [address] [phone] Allergies No Known Drug Intolerances Provider's Orders MRI, lower extremity W/WO Contrast, Left Foot - MRI W/WO Contrast to Left Foot, non-healing wounds. ICD10: Ell.621 and [id number] Hand Signature: Date(s): Electronic  Signature(s) Signed: 11/24/2019 5:35:06 PM By: Kela Millin Signed: 11/27/2019 5:03:49 PM By: Linton Ham MD Entered By: Kela Millin on 11/24/2019 10:47:49 -------------------------------------------------------------------------------- Problem List Details Patient Name: Date of Service: Chad Hall, [name] MA S W. 11/24/2019 9:00 A M Medical Record Number: [id number] Patient Account Number: [id number] Date of Birth/Sex: Treating RN: 09/21/1950 (69 y.o. 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 11/24/2019 9:00 A M Medical Record Number: [id number] Patient Account Number: [id number] Date of Birth/Sex: Treating RN: [birth year] (69 y.o. Male) Kela Millin Primary Care Provider: Lyndal Pulley Other Clinician: Referring Provider: Treating Provider/Extender: Burnis Kingfisher NK, PETER Weeks in Treatment: 0 Subjective Chief  Complaint Information obtained from Patient 11/24/2019; patient is here for review of multiple wound areas on his left foot History of Present Illness (HPI) ADMISSION 11/24/2019 Chad Hall is a 69 year old man with type 2 diabetes well controlled peripheral neuropathy and blindness secondary to remote bilateral retinal detachments I believe. His problem with regards to his left foot began it in November 2020. He developed osteomyelitis of his left toe/MTP and underwent a left third ray amputation by Dr. Sharol Given. He developed a wound dehiscence by early December and he was followed for this by Dr. Jess Barters office for most of this year. Most recently he has been using silver alginate. Trental and nitroglycerin patches have also been used. Early in September it was determined that the third grade dehiscence had closed over but for the first time mention of a wound on the  fourth toe, fourth met head and left plantar heel. Dr. Sharol Given had talked about a transmetatarsal amputation but that certainly would not address the very significant area on his heel now and also there was some conversation about a BKA. The patient is here to see what can be done to save his foot The patient is a well-controlled type II diabetic on oral agents. He also has severe chronic venous insufficiency, Marfan's, hyperlipidemia, thoracic aortic aneurysm, blindness. 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 He was on his feet for quite a period of time but he tells me he is not working currently. His ABI in our clinic was 1.03 on the left he was not felt in Dr. Jess Barters office based on bedside Dopplers to have arterial insufficiency. This seems quite correct Patient History Information obtained from Patient. Allergies No Known Drug Intolerances Family History Diabetes - Mother, Heart Disease - Mother, No family history of Cancer, Hereditary Spherocytosis, Hypertension, Kidney Disease, Lung Disease,  Seizures, Stroke, Thyroid Problems, Tuberculosis. Social History Former smoker, Marital Status - Married, Alcohol Use - Never, Drug Use - No History, Caffeine Use - Daily. Medical History Eyes Denies history of Cataracts, Glaucoma, Optic Neuritis Ear/Nose/Mouth/Throat Denies history of Chronic sinus problems/congestion, Middle ear problems Hematologic/Lymphatic Denies history of Anemia, Hemophilia, Human Immunodeficiency Virus, Lymphedema, Sickle Cell Disease Respiratory Denies history of Aspiration, Asthma, Chronic Obstructive Pulmonary Disease (COPD), Pneumothorax, Sleep Apnea, Tuberculosis Cardiovascular Patient has history of Hypertension Denies history of Angina, Arrhythmia, Congestive Heart Failure, Coronary Artery Disease, Deep Vein Thrombosis, Hypotension, Myocardial Infarction, Peripheral Arterial Disease, Peripheral Venous Disease, Phlebitis, Vasculitis Gastrointestinal Denies history of Cirrhosis , Colitis, Crohnoos, Hepatitis A, Hepatitis B, Hepatitis C Endocrine Patient has history of Type II Diabetes Denies history of Type I Diabetes Genitourinary Denies history of End Stage Renal Disease Immunological Denies history of Lupus Erythematosus, Raynaudoos, Scleroderma Integumentary (Skin) Denies history of History of Burn Musculoskeletal Denies history of Gout, Rheumatoid Arthritis, Osteoarthritis, Osteomyelitis Neurologic Patient has history of Neuropathy - feet Denies history of Dementia, Quadriplegia, Paraplegia Oncologic Denies history of Received Chemotherapy, Received Radiation Psychiatric Denies history of Anorexia/bulimia, Confinement Anxiety Patient is treated with Oral Agents. Review of Systems (ROS) Constitutional Symptoms (General Health) Denies complaints or symptoms of Fatigue, Fever, Chills, Marked Weight Change. Eyes Complains or has symptoms of Vision Changes - blind both eyes. Denies complaints or symptoms of Dry Eyes, Glasses /  Contacts. Ear/Nose/Mouth/Throat Denies complaints or symptoms of Chronic sinus problems or rhinitis. Respiratory Denies complaints or symptoms of Chronic or frequent coughs, Shortness of Breath. Cardiovascular Denies complaints or symptoms of Chest pain. Gastrointestinal Denies complaints or symptoms of Frequent diarrhea, Nausea, Vomiting. Endocrine Denies complaints or symptoms of Heat/cold intolerance. Genitourinary Denies complaints or symptoms of Frequent urination. Integumentary (Skin) Complains or has symptoms of Wounds. Musculoskeletal Denies complaints or symptoms of Muscle Pain, Muscle Weakness. Neurologic Denies complaints or symptoms of Numbness/parasthesias. Psychiatric Complains or has symptoms of Claustrophobia. Denies complaints or symptoms of Suicidal. Objective Constitutional Patient is hypertensive.. Pulse regular and within target range for patient.Marland Kitchen Respirations regular, non-labored and within target range.. Temperature is normal and within the target range for the patient.Marland Kitchen Appears in no distress. Vitals Time Taken: 9:34 AM, Height: 77 in, Source: Stated, Weight: 210 lbs, BMI: 24.9, Temperature: 98.4 F, Pulse: 73 bpm, Respiratory Rate: 18 breaths/min, Blood Pressure: 174/80 mmHg. Eyes Patient is blind wears sunglasses. Cardiovascular Nice popliteal pulses easily palpable. Pedal pulses palpable at the dorsalis pedis and posterior tibial. Neurological  Diabetic insensate neuropathy. Psychiatric appears at normal baseline. General Notes: Wound exam; there are several areas of concern 1. Firstly the original third ray amputation site is still open. I think this area is complicated by a probably continuous drainage related to chronic venous insufficiency and lymphedema extending into the left dorsal foot. The left foot skin is macerated and looks as though it is threatening to open as well. This is a linear area a lot of wet macerated tissue over the wound which  we removed with gauze. 2. He does indeed have a probing area on the base of the left fourth toe this goes precariously close to bone if not on bone. There is no purulent drainage. Nothing I really thought needed culturing 3. He has probing area over the left fourth met head. This had hyper granulation tissue coming out of the wound. This type of presentation always worries me for underlying infection perhaps drainage. The hyper granulated tissue was removed hemostasis with silver nitrate 4. He has a deep area on the plantar left heel with significant undermining. This is not go to bone but nevertheless is a concerning wound vis--vis the possibility of underlying infection 5. He has multiple areas over the left dorsal foot which I think are related to chronic venous hypertension secondary lymphedema and chronic drainage Integumentary (Hair, Skin) Patient has severe skin changes of chronic venous insufficiency in the distal lower leg extending into his dorsal foot. Wound #1 status is Open. Original cause of wound was Gradually Appeared. The wound is located on the Left,Dorsal Foot. The wound measures 11cm length x 9cm width x 0.1cm depth; 77.754cm^2 area and 7.775cm^3 volume. There is Fat Layer (Subcutaneous Tissue) exposed. There is no tunneling or undermining noted. There is a medium amount of serosanguineous drainage noted. There is small (1-33%) pink granulation within the wound bed. There is a large (67-100%) amount of necrotic tissue within the wound bed including Adherent Slough. Wound #2 status is Open. Original cause of wound was Gradually Appeared. The wound is located on the Left Amputation Site - T The wound measures 6cm oe. length x 3cm width x 0.1cm depth; 14.137cm^2 area and 1.414cm^3 volume. There is no tunneling or undermining noted. There is a medium amount of serosanguineous drainage noted. There is no granulation within the wound bed. There is a large (67-100%) amount of necrotic  tissue within the wound bed including Adherent Slough. Wound #3 status is Open. Original cause of wound was Gradually Appeared. The wound is located on the Left T Fourth. The wound measures 5cm length x oe 1.2cm width x 0.1cm depth; 4.712cm^2 area and 0.471cm^3 volume. There is no tunneling or undermining noted. There is a medium amount of serosanguineous drainage noted. There is no granulation within the wound bed. There is a large (67-100%) amount of necrotic tissue within the wound bed including Adherent Slough. Wound #4 status is Open. Original cause of wound was Gradually Appeared. The wound is located on the Left Metatarsal head fourth. The wound measures 1.5cm length x 1.2cm width x 0.1cm depth; 1.414cm^2 area and 0.141cm^3 volume. There is Fat Layer (Subcutaneous Tissue) exposed. There is no tunneling or undermining noted. There is a medium amount of serosanguineous drainage noted. There is no granulation within the wound bed. There is a large (67-100%) amount of necrotic tissue within the wound bed including Adherent Slough. Wound #5 status is Open. Original cause of wound was Gradually Appeared. The wound is located on the Left Calcaneus. The wound measures  1.5cm length x 2.5cm width x 0.7cm depth; 2.945cm^2 area and 2.062cm^3 volume. There is Fat Layer (Subcutaneous Tissue) exposed. There is no tunneling or undermining noted. There is a medium amount of serosanguineous drainage noted. There is large (67-100%) pink granulation within the wound bed. There is a small (1-33%) amount of necrotic tissue within the wound bed including Adherent Slough. 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 There was a Excisional Skin/Subcutaneous Tissue Debridement with a total area of 1.8 sq cm performed by Chad Dillon., MD. With the following instrument(s): Blade to remove Viable and Non-Viable tissue/material. Material removed includes Callus and Subcutaneous Tissue and after achieving pain control using Other (benzocaine, 20%). No specimens were taken. A time out was conducted at 10:35, prior to the start of the procedure. A Moderate amount of bleeding was controlled with Silver Nitrate. The procedure was tolerated well with a pain level of 0 throughout and a pain level of 0 following the procedure. Post Debridement Measurements: 1.5cm length x 1.2cm width x 0.1cm depth; 0.141cm^3 volume. Character of Wound/Ulcer Post Debridement is improved. Severity of Tissue Post Debridement is: Fat layer exposed. Post procedure Diagnosis Wound #4: Same as Pre-Procedure Pre-procedure diagnosis of Wound #4 is a Diabetic Wound/Ulcer of the Lower Extremity located on the Left Metatarsal head fourth . There was a Three Layer Compression Therapy Procedure by Chad Pilling, RN. Post procedure Diagnosis Wound #4: Same as Pre-Procedure Wound #1 Pre-procedure diagnosis of Wound #1 is a Diabetic Wound/Ulcer of the Lower Extremity located on the Left,Dorsal Foot . There was a Three Layer Compression Therapy Procedure by Chad Pilling, RN. Post procedure Diagnosis Wound #1: Same as Pre-Procedure Wound #5 Pre-procedure diagnosis of Wound #5 is a Diabetic Wound/Ulcer of the Lower Extremity located on the Left Calcaneus . There was a Three Layer Compression Therapy Procedure by Chad Pilling, RN. Post procedure Diagnosis Wound  #5: Same as Pre-Procedure Plan Follow-up Appointments: Return Appointment in 1 week. Nurse Visit: - Tuesday Dressing Change Frequency: Do not change entire dressing for one week. Wound Cleansing: Wound #1 Left,Dorsal Foot: May shower with protection. - cast protector Primary Wound Dressing: Wound #1 Left,Dorsal Foot: Calcium Alginate with Silver - place silver alginate to all areas of maceration to foot Wound #2 Left Amputation Site - T oe: Calcium Alginate with Silver Wound #3 Left T Fourth: oe Calcium Alginate with Silver Wound #4 Left Metatarsal head fourth: Calcium Alginate with Silver Wound #5 Left Calcaneus: Calcium Alginate with Silver Secondary Dressing: Wound #1 Left,Dorsal Foot: Dry Gauze Heel Cup Other: - use netting and stockinnette to cover all toes so patient does not have to change dressing Edema Control: 3 Layer Compression System - Left Lower Extremity Off-Loading: Open toe surgical shoe to: Radiology ordered were: MRI, lower extremity W/WO Contrast, Left Foot - MRI W/WO Contrast to Left Foot, non-healing wounds. ICD10: Ell.621 and [id number] 6. The patient came to see [place] today to see if there was anything that can be done to save his foot. He  tells [place] he is not opposed to a below-knee amputation if that is absolutely necessary. I told him that we would go forward with the idea of trying to save the foot as long as this is feasible. 2. He does not have an arterial issue that I can determine in the clinic which indeed is the first step in seeing if something can be salvaged. 3. He has very concerning areas in the left fourth toe, left fourth metatarsal head and the deep area over his plantar heel he is definitely going to need an MRI of his foot with and without this determine the presence of underlying osteomyelitis. If there is underlying osteomyelitis is especially extensive infection in the foot he will likely need a BKA and I told him this. 4. I think a lot  of the problem on the dorsal foot may be even his amputation site is drainage related to uncontrolled chronic venous insufficiency likely resect some secondary lymphedema. For this reason today we put him in 3 layer compression 5. Liberal silver alginate on the dorsal foot, right amputation site and the deeper wounds on his foot. We will bring him back on Monday or Tuesday for a nurse check. This is certainly not something that the patient could do whether or not they are blind. He was apparently discharged from home health sometime ago he lives with his wife who is also visually impaired 6. We gave him a new surgical shoe. If I can get the MRI done and there is NOT underlying deep infection then he will likely need a total contact cast 7 fortunately the patient is not working at this point I did not delve into things about whether he needs to go back to work or not but I told him he is going to need to stay off his foot is much as possible 8. I did not do any cultures nor were any antibiotics prescribed at this point I spent 45 minutes in review of this patient's medical record face-to-face evaluation and preparation of this record. Electronic Signature(s) Signed: 11/27/2019 5:03:49 PM By: Linton Ham MD Entered By: Linton Ham on 11/24/2019 11:14:53 -------------------------------------------------------------------------------- HxROS Details Patient Name: Date of Service: Chad Bishop MS, [place] MA S W. 11/24/2019 9:00 A M Medical Record Number: [id number] Patient Account Number: [id number] Date of Birth/Sex: Treating RN: [birth year] (69 y.o. Male) Carlene Coria Primary Care Provider: Other Clinician: Myrle Sheng, PETER Referring Provider: Treating Provider/Extender: Burnis Kingfisher NK, PETER Weeks in Treatment: 0 Information Obtained From Patient Constitutional Symptoms (General Health) Complaints and Symptoms: Negative for: Fatigue; Fever; Chills; Marked Weight Change Eyes Complaints  and Symptoms: Positive for: Vision Changes - blind both eyes Negative for: Dry Eyes; Glasses / Contacts Medical History: Negative for: Cataracts; Glaucoma; Optic Neuritis Ear/Nose/Mouth/Throat Complaints and Symptoms: Negative for: Chronic sinus problems or rhinitis Medical History: Negative for: Chronic sinus problems/congestion; Middle ear problems Respiratory Complaints and Symptoms: Negative for: Chronic or frequent coughs; Shortness of Breath Medical History: Negative for: Aspiration; Asthma; Chronic Obstructive Pulmonary Disease (COPD); Pneumothorax; Sleep Apnea; Tuberculosis Cardiovascular Complaints and Symptoms: Negative for: Chest pain Medical History: Positive for: Hypertension Negative for: Angina; Arrhythmia; Congestive Heart Failure; Coronary Artery Disease; Deep Vein Thrombosis; Hypotension; Myocardial Infarction; Peripheral Arterial Disease; Peripheral Venous Disease; Phlebitis; Vasculitis Gastrointestinal Complaints and Symptoms: Negative for: Frequent diarrhea; Nausea; Vomiting Medical History: Negative for: Cirrhosis ; Colitis; Crohns; Hepatitis A; Hepatitis B; Hepatitis C Endocrine Complaints and Symptoms: Negative for: Heat/cold intolerance Medical History: Positive for: Type II Diabetes Negative for:  Type I Diabetes Time with diabetes: 37 Treated with: Oral agents Genitourinary Complaints and Symptoms: Negative for: Frequent urination Medical History: Negative for: End Stage Renal Disease Integumentary (Skin) Complaints and Symptoms: Positive for: Wounds Medical History: Negative for: History of Burn Musculoskeletal Complaints and Symptoms: Negative for: Muscle Pain; Muscle Weakness Medical History: Negative for: Gout; Rheumatoid Arthritis; Osteoarthritis; Osteomyelitis Neurologic Complaints and Symptoms: Negative for: Numbness/parasthesias Medical History: Positive for: Neuropathy - feet Negative for: Dementia; Quadriplegia;  Paraplegia Psychiatric Complaints and Symptoms: Positive for: Claustrophobia Negative for: Suicidal Medical History: Negative for: Anorexia/bulimia; Confinement Anxiety Hematologic/Lymphatic Medical History: Negative for: Anemia; Hemophilia; Human Immunodeficiency Virus; Lymphedema; Sickle Cell Disease Immunological Medical History: Negative for: Lupus Erythematosus; Raynauds; Scleroderma Oncologic Medical History: Negative for: Received Chemotherapy; Received Radiation Immunizations Pneumococcal Vaccine: Received Pneumococcal Vaccination: No Implantable Devices None Family and Social History Cancer: No; Diabetes: Yes - Mother; Heart Disease: Yes - Mother; Hereditary Spherocytosis: No; Hypertension: No; Kidney Disease: No; Lung Disease: No; Seizures: No; Stroke: No; Thyroid Problems: No; Tuberculosis: No; Former smoker; Marital Status - Married; Alcohol Use: Never; Drug Use: No History; Caffeine Use: Daily; Financial Concerns: No; Food, Clothing or Shelter Needs: No; Support System Lacking: No; Transportation Concerns: No Electronic Signature(s) Signed: 11/27/2019 5:03:49 PM By: Linton Ham MD Signed: 11/28/2019 5:49:00 PM By: Carlene Coria RN Entered By: Carlene Coria on 11/24/2019 09:40:42 -------------------------------------------------------------------------------- SuperBill Details Patient Name: Date of Service: Chad Bishop MS, [name] MA S W. 11/24/2019 Medical Record Number: [id number] Patient Account Number: [id number] Date of Birth/Sex: Treating RN: Mar 06, 1950 (69 y.o. 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 L97.528 Non-pressure chronic ulcer of other part of left foot with other specified severity I87.332 Chronic venous hypertension (idiopathic) with ulcer and inflammation of left lower extremity Facility Procedures CPT4 Code: 01093235 Description: 99213 - WOUND CARE VISIT-LEV 3 EST PT Modifier: 25 Quantity: 1 CPT4 Code: 57322025 Description: 42706 - DEB SUBQ TISSUE 20 SQ CM/< ICD-10 Diagnosis Description L97.528 Non-pressure chronic ulcer of other part of left foot with other specified sever Modifier: ity Quantity: 1 Physician Procedures : CPT4 Code Description Modifier [id number] 15176 - WC PHYS LEVEL 4 - NEW PT 25 ICD-10 Diagnosis Description E11.621 Type 2 diabetes mellitus with foot ulcer T81.31XD Disruption of external operation (surgical) wound, not elsewhere classified, subsequent  encounter 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

## 2019-11-29 ENCOUNTER — Encounter (HOSPITAL_BASED_OUTPATIENT_CLINIC_OR_DEPARTMENT_OTHER): Payer: Medicare Other | Admitting: Physician Assistant

## 2019-11-29 DIAGNOSIS — E11621 Type 2 diabetes mellitus with foot ulcer: Secondary | ICD-10-CM | POA: Diagnosis not present

## 2019-11-29 NOTE — Progress Notes (Signed)
CHARVEZ, VOORHIES ([id number]) Visit Report for 11/29/2019 Arrival Information Details Patient Name: Date of Service: Jerelyn Scott [place]. 11/29/2019 10:30 A M Medical Record Number: [id number] Patient Account Number: [id number] Date of Birth/Sex: Treating RN: 11-30-1950 (69 y.o. Harlon Flor, [place] Primary Care Shakeema Lippman: Alanda Amass Other Clinician: Referring Carmin Dibartolo: Treating Kiaria Quinnell/Extender: Otis Dials NK, PETER Weeks in Treatment: 0 Visit Information History Since Last Visit Added or deleted any medications: No Patient Arrived: Ambulatory Any new allergies or adverse reactions: No Arrival Time: 10:32 Had a fall or experienced change in No Accompanied By: self activities of daily living that may affect Transfer Assistance: None risk of falls: Patient Identification Verified: Yes Signs or symptoms of abuse/neglect since No Secondary Verification Process Completed: Yes last visito Patient Requires Transmission-Based Precautions: No Hospitalized since last visit: No Patient Has Alerts: No Implantable device outside of the clinic No excluding cellular tissue based products placed in the center since last visit: Has Dressing in Place as Prescribed: Yes Has Compression in Place as Prescribed: Yes Has Footwear/Offloading in Place as Yes Prescribed: Left: Surgical Shoe with Pressure Relief Insole Pain Present Now: No Electronic Signature(s) Signed: 11/29/2019 12:31:04 PM By: Shawn Stall Entered By: Shawn Stall on 11/29/2019 11:03:19 -------------------------------------------------------------------------------- Compression Therapy Details Patient Name: Date of Service: Jerelyn Scott MA S W. 11/29/2019 10:30 A M Medical Record Number: [id number] Patient Account Number: [id number] Date of Birth/Sex: Treating RN: Jan 16, 1951 (69 y.o. Tammy Sours Primary Care Channing Yeager: Alanda Amass Other Clinician: Referring Jaquanna Ballentine: Treating Carnesha Maravilla/Extender: Otis Dials NK, PETER Weeks in Treatment: 0 Compression Therapy Performed for Wound Assessment: Wound #5 Left Calcaneus Performed By: Clinician Shawn Stall, RN Compression Type: Three Layer Electronic Signature(s) Signed: 11/29/2019 12:31:04 PM By: Shawn Stall Entered By: Shawn Stall on 11/29/2019 11:04:36 -------------------------------------------------------------------------------- Encounter Discharge Information Details Patient Name: Date of Service: [name], Barkley Boards MA S W. 11/29/2019 10:30 A M Medical Record Number: [id number] Patient Account Number: [id number] Date of Birth/Sex: Treating RN: 1950-11-15 (69 y.o. Tammy Sours Primary Care Teona Vargus: Alanda Amass Other Clinician: Referring Aneira Cavitt: Treating Tatem Fesler/Extender: Otis Dials NK, PETER Weeks in Treatment: 0 Encounter Discharge Information Items Discharge Condition: Stable Ambulatory Status: Ambulatory Discharge Destination: Home Transportation: Private Auto Accompanied By: self Schedule Follow-up Appointment: Yes Clinical Summary of Care: Electronic Signature(s) Signed: 11/29/2019 12:31:04 PM By: Shawn Stall Entered By: Shawn Stall on 11/29/2019 11:06:30 -------------------------------------------------------------------------------- Patient/Caregiver Education Details Patient Name: Date of Service: Verlon Setting 10/13/2021andnbsp10:30 A M Medical Record Number: [id number] Patient Account Number: [id number] Date of Birth/Gender: Treating RN: January 09, 1951 (69 y.o. Tammy Sours Primary Care Physician: Alanda Amass Other Clinician: Referring Physician: Treating Physician/Extender: Tamela Oddi in Treatment: 0 Education Assessment Education Provided To: Patient Education Topics Provided [facility]: o Handouts: Welcome [facility] o Methods: Explain/Verbal Responses: Reinforcements needed Electronic  Signature(s) Signed: 11/29/2019 12:31:04 PM By: Shawn Stall Entered By: Shawn Stall on 11/29/2019 11:06:21 -------------------------------------------------------------------------------- Wound Assessment Details Patient Name: Date of Service: Jerelyn Scott MA S W. 11/29/2019 10:30 A M Medical Record Number: [id number] Patient Account Number: [id number] Date of Birth/Sex: Treating RN: [birth year] (69 y.o. Tammy Sours Primary Care Antinio Sanderfer: Alanda Amass Other Clinician: Referring Cadarius Nevares: Treating Pasha Broad/Extender: Otis Dials NK, PETER Weeks in Treatment: 0 Wound Status Wound Number: 1 Primary Etiology: Diabetic Wound/Ulcer of the Lower Extremity Wound Location: Left, Dorsal Foot Wound  Status: Open Wounding Event: Gradually Appeared Date Acquired: 08/17/2019 Weeks Of Treatment: 0 Clustered Wound: No Wound Measurements Length: (cm) 11 Width: (cm) 9 Depth: (cm) 0.1 Area: (cm) 77.754 Volume: (cm) 7.775 % Reduction in Area: 0% % Reduction in Volume: 0% Wound Description Classification: Grade 2 Treatment Notes Wound #1 (Left, Dorsal Foot) 1. Cleanse With Soap and water 2. Periwound Care Barrier cream Moisturizing lotion 3. Primary Dressing Applied Calcium Alginate Ag 4. Secondary Dressing ABD Pad Dry Gauze 6. Support Layer Applied 3 layer compression wrap Electronic Signature(s) Signed: 11/29/2019 12:31:04 PM By: Shawn Stall Entered By: Shawn Stall on 11/29/2019 11:04:07 -------------------------------------------------------------------------------- Wound Assessment Details Patient Name: Date of Service: Jerelyn Scott MA S W. 11/29/2019 10:30 A M Medical Record Number: [id number] Patient Account Number: [id number] Date of Birth/Sex: Treating RN: [birth year] (69 y.o. Tammy Sours Primary Care Jammi Morrissette: Alanda Amass Other Clinician: Referring Cristan Scherzer: Treating Pius Byrom/Extender: Otis Dials NK, PETER Weeks in Treatment:  0 Wound Status Wound Number: 2 Primary Etiology: Diabetic Wound/Ulcer of the Lower Extremity Wound Location: Left Amputation Site - Toe Wound Status: Open Wounding Event: Gradually Appeared Date Acquired: 01/17/2019 Weeks Of Treatment: 0 Clustered Wound: No Wound Measurements Length: (cm) 6 Width: (cm) 3 Depth: (cm) 0.1 Area: (cm) 14.137 Volume: (cm) 1.414 % Reduction in Area: 0% % Reduction in Volume: 0% Wound Description Classification: Grade 2 Treatment Notes Wound #2 (Left Amputation Site - Toe) 1. Cleanse With Soap and water 2. Periwound Care Barrier cream Moisturizing lotion 3. Primary Dressing Applied Calcium Alginate Ag 4. Secondary Dressing ABD Pad Dry Gauze Electronic Signature(s) Signed: 11/29/2019 12:31:04 PM By: Shawn Stall Entered By: Shawn Stall on 11/29/2019 11:04:07 -------------------------------------------------------------------------------- Wound Assessment Details Patient Name: Date of Service: Jerelyn Scott MA S W. 11/29/2019 10:30 A M Medical Record Number: [id number] Patient Account Number: [id number] Date of Birth/Sex: Treating RN: 1950-04-15 (69 y.o. Tammy Sours Primary Care Ishita Mcnerney: Alanda Amass Other Clinician: Referring Zarriah Starkel: Treating Anjelina Dung/Extender: Otis Dials NK, PETER Weeks in Treatment: 0 Wound Status Wound Number: 3 Primary Etiology: Diabetic Wound/Ulcer of the Lower Extremity Wound Location: Left T Fourth oe Wound Status: Open Wounding Event: Gradually Appeared Date Acquired: 09/17/2019 Weeks Of Treatment: 0 Clustered Wound: No Wound Measurements Length: (cm) 5 Width: (cm) 1.2 Depth: (cm) 0.1 Area: (cm) 4.712 Volume: (cm) 0.471 % Reduction in Area: 0% % Reduction in Volume: 0% Wound Description Classification: Grade 2 Treatment Notes Wound #3 (Left Toe Fourth) 1. Cleanse With Soap and water 2. Periwound Care Barrier cream Moisturizing lotion 3. Primary Dressing Applied Calcium  Alginate Ag 4. Secondary Dressing ABD Pad Dry Gauze Electronic Signature(s) Signed: 11/29/2019 12:31:04 PM By: Shawn Stall Entered By: Shawn Stall on 11/29/2019 11:04:07 -------------------------------------------------------------------------------- Wound Assessment Details Patient Name: Date of Service: Jerelyn Scott MA S W. 11/29/2019 10:30 A M Medical Record Number: [id number] Patient Account Number: [id number] Date of Birth/Sex: Treating RN: [birth year] (69 y.o. Tammy Sours Primary Care Ernst Cumpston: Alanda Amass Other Clinician: Referring Nyaja [place]: Treating Muhammad Vacca/Extender: Otis Dials NK, PETER Weeks in Treatment: 0 Wound Status Wound Number: 4 Primary Etiology: Diabetic Wound/Ulcer of the Lower Extremity Wound Location: Left Metatarsal head fourth Wound Status: Open Wounding Event: Gradually Appeared Date Acquired: 08/17/2019 Weeks Of Treatment: 0 Clustered Wound: No Wound Measurements Length: (cm) 1.5 Width: (cm) 1.2 Depth: (cm) 0.1 Area: (cm) 1.414 Volume: (cm) 0.141 % Reduction in Area: 0% % Reduction in Volume: 0% Wound Description Classification: Grade 2 Treatment Notes Wound #4 (Left  Metatarsal head fourth) 1. Cleanse With Soap and water 2. Periwound Care Barrier cream Moisturizing lotion 3. Primary Dressing Applied Calcium Alginate Ag 4. Secondary Dressing ABD Pad Dry Gauze Electronic Signature(s) Signed: 11/29/2019 12:31:04 PM By: Shawn Stall Entered By: Shawn Stall on 11/29/2019 11:04:07 -------------------------------------------------------------------------------- Wound Assessment Details Patient Name: Date of Service: Jerelyn Scott MA S W. 11/29/2019 10:30 A M Medical Record Number: [id number] Patient Account Number: [id number] Date of Birth/Sex: Treating RN: 28-Mar-1950 (69 y.o. Tammy Sours Primary Care Lakiah Dhingra: Alanda Amass Other Clinician: Referring Brenn Gatton: Treating Yazmeen Woolf/Extender: Otis Dials NK, PETER Weeks in Treatment: 0 Wound Status Wound Number: 5 Primary Etiology: Diabetic Wound/Ulcer of the Lower Extremity Wound Location: Left Calcaneus Wound Status: Open Wounding Event: Gradually Appeared Date Acquired: 09/17/2019 Weeks Of Treatment: 0 Clustered Wound: No Wound Measurements Length: (cm) 1.5 Width: (cm) 2.5 Depth: (cm) 0.7 Area: (cm) 2.945 Volume: (cm) 2.062 % Reduction in Area: 0% % Reduction in Volume: 0% Wound Description Classification: Grade 2 Treatment Notes Wound #5 (Left Calcaneus) 1. Cleanse With Soap and water 2. Periwound Care Barrier cream Moisturizing lotion 3. Primary Dressing Applied Calcium Alginate Ag 4. Secondary Dressing Dry Gauze Heel Cup 6. Support Layer Applied 3 layer compression wrap 7. Footwear/Offloading device applied Surgical shoe Notes stockinette and netting applied to foot and leg. Electronic Signature(s) Signed: 11/29/2019 12:31:04 PM By: Shawn Stall Entered By: Shawn Stall on 11/29/2019 11:04:07 -------------------------------------------------------------------------------- Vitals Details Patient Name: Date of Service: [name], [place] MA S W. 11/29/2019 10:30 A M Medical Record Number: [id number] Patient Account Number: [id number] Date of Birth/Sex: Treating RN: 09/14/1950 (69 y.o. Tammy Sours Primary Care Antrice Pal: Alanda Amass Other Clinician: Referring Nicolette Gieske: Treating Ayza Ripoll/Extender: Otis Dials NK, PETER Weeks in Treatment: 0 Vital Signs Time Taken: 10:37 Temperature (F): 98.5 Height (in): 77 Pulse (bpm): 56 Weight (lbs): 210 Respiratory Rate (breaths/min): 18 Body Mass Index (BMI): 24.9 Blood Pressure (mmHg): 182/79 Reference Range: 80 - 120 mg / dl Electronic Signature(s) Signed: 11/29/2019 12:31:04 PM By: Shawn Stall Entered By: Shawn Stall on 11/29/2019 11:03:35

## 2019-12-01 ENCOUNTER — Encounter (HOSPITAL_BASED_OUTPATIENT_CLINIC_OR_DEPARTMENT_OTHER): Payer: Medicare Other | Admitting: Internal Medicine

## 2019-12-01 DIAGNOSIS — E11621 Type 2 diabetes mellitus with foot ulcer: Secondary | ICD-10-CM | POA: Diagnosis not present

## 2019-12-01 NOTE — Progress Notes (Signed)
Urban GibsonWILLIAMS, Chad W. ([id number]) Visit Report for 12/01/2019 Arrival Information Details Patient Name: Date of Service: Chad ScottWILLIA MS, THO [place] S [place]. 12/01/2019 9:00 A M Medical Record Number: [id number] Patient Account Number: [id number] Date of Birth/Sex: Treating RN: 12-19-1950 (69 y.o. Damaris SchoonerM) Boehlein, Linda Primary Care Courtney Bellizzi: Kandis FantasiaFRA NK, Theron AristaPETER Other Clinician: Referring Nyasiah Moffet: Treating Asahd Can/Extender: Linton Rumpobson, Michael FRA NK, PETER Weeks in Treatment: 1 Visit Information History Since Last Visit Added or deleted any medications: No Patient Arrived: Ambulatory Any new allergies or adverse reactions: No Arrival Time: 08:55 Had a fall or experienced change in No Accompanied By: self activities of daily living that may affect Transfer Assistance: None risk of falls: Patient Identification Verified: Yes Signs or symptoms of abuse/neglect since last visito No Secondary Verification Process Completed: Yes Hospitalized since last visit: No Patient Requires Transmission-Based Precautions: No Implantable device outside of the clinic excluding No Patient Has Alerts: No cellular tissue based products placed in the center since last visit: Has Dressing in Place as Prescribed: Yes Pain Present Now: No Electronic Signature(s) Signed: 12/01/2019 11:16:13 AM By: Karl Itoawkins, Destiny Entered By: Karl Itoawkins, Destiny on 12/01/2019 08:57:19 -------------------------------------------------------------------------------- Compression Therapy Details Patient Name: Date of Service: Chad ScottWILLIA MS, THO MA S W. 12/01/2019 9:00 A M Medical Record Number: [id number] Patient Account Number: [id number] Date of Birth/Sex: Treating RN: 12-19-1950 (69 y.o. Damaris SchoonerM) Boehlein, Linda Primary Care Khing Belcher: Kandis FantasiaFRA NK, Theron AristaPETER Other Clinician: Referring Chief Walkup: Treating Dessa Ledee/Extender: Mikey Collegeobson, Michael FRA NK, PETER Weeks in Treatment: 1 Compression Therapy Performed for Wound Assessment: Wound #1 Left,Dorsal Foot Performed By:  Clinician Shawn Stalleaton, Bobbi, RN Compression Type: Three Layer Post Procedure Diagnosis Same as Pre-procedure Electronic Signature(s) Signed: 12/01/2019 6:20:53 PM By: Zenaida DeedBoehlein, Linda RN, BSN Entered By: Zenaida DeedBoehlein, Linda on 12/01/2019 09:34:52 -------------------------------------------------------------------------------- Encounter Discharge Information Details Patient Name: Date of Service: Chad BlalockWILLIA MS, Barkley BoardsHO MA S W. 12/01/2019 9:00 A M Medical Record Number: [id number] Patient Account Number: [id number] Date of Birth/Sex: Treating RN: 12-19-1950 (69 y.o. Tammy SoursM) Deaton, Bobbi Primary Care Adarryl Goldammer: Alanda AmassFRA NK, PETER Other Clinician: Referring Brodi Nery: Treating Kyiesha Millward/Extender: Linton Rumpobson, Michael FRA NK, PETER Weeks in Treatment: 1 Encounter Discharge Information Items Post Procedure Vitals Discharge Condition: Stable Temperature (F): 98.5 Ambulatory Status: Ambulatory Pulse (bpm): 60 Discharge Destination: Home Respiratory Rate (breaths/min): 18 Transportation: Private Auto Blood Pressure (mmHg): 159/67 Accompanied By: self Schedule Follow-up Appointment: Yes Clinical Summary of Care: Electronic Signature(s) Signed: 12/01/2019 5:54:53 PM By: Shawn Stalleaton, Bobbi Entered By: Shawn Stalleaton, Bobbi on 12/01/2019 10:35:44 -------------------------------------------------------------------------------- Lower Extremity Assessment Details Patient Name: Date of Service: Chad ScottWILLIA MS, THO MA S W. 12/01/2019 9:00 A M Medical Record Number: [id number] Patient Account Number: [id number] Date of Birth/Sex: Treating RN: 12-19-1950 (69 y.o. Damaris SchoonerM) Boehlein, Linda Primary Care Janesia Joswick: Kandis FantasiaFRA NK, Theron AristaPETER Other Clinician: Referring Naiyah Klostermann: Treating Concepcion Gillott/Extender: Mikey Collegeobson, Michael FRA NK, PETER Weeks in Treatment: 1 Edema Assessment Assessed: [Left: Yes] [Right: No] Edema: [Left: Ye] [Right: s] Calf Left: Right: Point of Measurement: 47 cm From Medial Instep 29.5 cm Ankle Left: Right: Point of Measurement: 12 cm  From Medial Instep 21.5 cm Vascular Assessment Pulses: Dorsalis Pedis Palpable: [Left:Yes] Electronic Signature(s) Signed: 12/01/2019 11:16:13 AM By: Karl Itoawkins, Destiny Signed: 12/01/2019 6:20:53 PM By: Zenaida DeedBoehlein, Linda RN, BSN Entered By: Karl Itoawkins, Destiny on 12/01/2019 09:18:12 -------------------------------------------------------------------------------- Multi Wound Chart Details Patient Name: Date of Service: Chad BlalockWILLIA MS, Barkley BoardsHO MA S W. 12/01/2019 9:00 A M Medical Record Number: [id number] Patient Account Number: [id number] Date of Birth/Sex: Treating RN: 12-19-1950 (69 y.o. Damaris SchoonerM) Boehlein, Linda Primary Care Jaquarius Seder: Alanda AmassFRA NK, PETER Other Clinician: Referring Toneisha Savary: Treating Emelin Dascenzo/Extender: Leanord Hawkingobson,  Dante Gang NK, PETER Weeks in Treatment: 1 Vital Signs Height(in): 77 Pulse(bpm): 60 Weight(lbs): 210 Blood Pressure(mmHg): 159/76 Body Mass Index(BMI): 25 Temperature(F): 98.5 Respiratory Rate(breaths/min): 18 Photos: [1:No Photos Left, Dorsal Foot] [2:No Photos Left Amputation Site - Toe] [3:No Photos Left T Fourth oe] Wound Location: [1:Gradually Appeared] [2:Gradually Appeared] [3:Gradually Appeared] Wounding Event: [1:Diabetic Wound/Ulcer of the Lower] [2:Diabetic Wound/Ulcer of the Lower] [3:Diabetic Wound/Ulcer of the Lower] Primary Etiology: [1:Extremity Hypertension, Type II Diabetes,] [2:Extremity Hypertension, Type II Diabetes,] [3:Extremity Hypertension, Type II Diabetes,] Comorbid History: [1:Neuropathy 08/17/2019] [2:Neuropathy 01/17/2019] [3:Neuropathy 09/17/2019] Date Acquired: [1:1] [2:1] [3:1] Weeks of Treatment: [1:Open] [2:Open] [3:Open] Wound Status: [1:10.5x9x0.1] [2:4.5x1x0.4] [3:0.5x1.1x0.7] Measurements L x W x D (cm) [1:74.22] [2:3.534] [3:0.432] A (cm) : rea [1:7.422] [2:1.414] [3:0.302] Volume (cm) : [1:4.50%] [2:75.00%] [3:90.80%] % Reduction in A [1:rea: 4.50%] [2:0.00%] [3:35.90%] % Reduction in Volume: [3:6] Position 1 (o'clock): [3:1] Maximum  Distance 1 (cm): [1:No] [2:No] [3:Yes] Tunneling: [1:Grade 2] [2:Grade 2] [3:Grade 2] Classification: [1:Large] [2:Medium] [3:Medium] Exudate A mount: [1:Serosanguineous] [2:Serosanguineous] [3:Serosanguineous] Exudate Type: [1:red, brown] [2:red, brown] [3:red, brown] Exudate Color: [1:Distinct, outline attached] [2:Distinct, outline attached] [3:Distinct, outline attached] Wound Margin: [1:Large (67-100%)] [2:Small (1-33%)] [3:Small (1-33%)] Granulation A mount: [1:Red, Pink] [2:Red, Pink] [3:Red, Pink] Granulation Quality: [1:Small (1-33%)] [2:Large (67-100%)] [3:Large (67-100%)] Necrotic A mount: [1:Fat Layer (Subcutaneous Tissue): Yes Fat Layer (Subcutaneous Tissue): Yes Fat Layer (Subcutaneous Tissue): Yes] Exposed Structures: [1:Fascia: No Tendon: No Muscle: No Joint: No Bone: No Small (1-33%)] [2:Fascia: No Tendon: No Muscle: No Joint: No Bone: No Small (1-33%)] [3:Fascia: No Tendon: No Muscle: No Joint: No Bone: No Small (1-33%)] Epithelialization: [1:N/A] [2:Debridement - Excisional] [3:N/A] Debridement: Pre-procedure Verification/Time Out N/A [2:09:35] [3:N/A] Taken: [1:N/A] [2:Other] [3:N/A] Pain Control: [1:N/A] [2:Subcutaneous, Slough] [3:N/A] Tissue Debrided: [1:N/A] [2:Skin/Subcutaneous Tissue] [3:N/A] Level: [1:N/A] [2:4.5] [3:N/A] Debridement A (sq cm): [1:rea N/A] [2:Curette] [3:N/A] Instrument: [1:N/A] [2:Minimum] [3:N/A] Bleeding: [1:N/A] [2:Pressure] [3:N/A] Hemostasis A chieved: [1:N/A] [2:0] [3:N/A] Procedural Pain: [1:N/A] [2:0] [3:N/A] Post Procedural Pain: [1:N/A] [2:Procedure was tolerated well] [3:N/A] Debridement Treatment Response: [1:N/A] [2:4.5x1.9x0.4] [3:N/A] Post Debridement Measurements L x W x D (cm) [1:N/A] [2:2.686] [3:N/A] Post Debridement Volume: (cm) [1:Compression Therapy] [2:Debridement] [3:N/A] Wound Number: 4 5 N/A Photos: No Photos No Photos N/A Left Metatarsal head fourth Left Calcaneus N/A Wound Location: Gradually Appeared  Gradually Appeared N/A Wounding Event: Diabetic Wound/Ulcer of the Lower Diabetic Wound/Ulcer of the Lower N/A Primary Etiology: Extremity Extremity Hypertension, Type II Diabetes, Hypertension, Type II Diabetes, N/A Comorbid History: Neuropathy Neuropathy 08/17/2019 09/17/2019 N/A Date Acquired: 1 1 N/A Weeks of Treatment: Open Open N/A Wound Status: 1.4x1.5x0.9 1.4x2x1.7 N/A Measurements L x W x D (cm) 1.649 2.199 N/A A (cm) : rea 1.484 3.738 N/A Volume (cm) : -16.60% 25.30% N/A % Reduction in A rea: -952.50% -81.30% N/A % Reduction in Volume: No No N/A Tunneling: Grade 2 Grade 2 N/A Classification: Large Large N/A Exudate A mount: Serosanguineous Serosanguineous N/A Exudate Type: red, brown red, brown N/A Exudate Color: Distinct, outline attached Distinct, outline attached N/A Wound Margin: Medium (34-66%) Medium (34-66%) N/A Granulation A mount: Red, Pink Red, Pink N/A Granulation Quality: Medium (34-66%) Medium (34-66%) N/A Necrotic A mount: Fat Layer (Subcutaneous Tissue): Yes Fat Layer (Subcutaneous Tissue): Yes N/A Exposed Structures: Fascia: No Fascia: No Tendon: No Tendon: No Muscle: No Muscle: No Joint: No Joint: No Bone: No Bone: No Small (1-33%) Small (1-33%) N/A Epithelialization: Debridement - Excisional Debridement - Excisional N/A Debridement: Pre-procedure Verification/Time Out 09:35 09:35 N/A Taken: Other Other N/A  Pain Control: Subcutaneous, Slough Subcutaneous, Slough N/A Tissue Debrided: Skin/Subcutaneous Tissue Skin/Subcutaneous Tissue N/A Level: 2.1 2.8 N/A Debridement A (sq cm): rea Curette Curette N/A Instrument: Minimum Minimum N/A Bleeding: Pressure Pressure N/A Hemostasis A chieved: 0 0 N/A Procedural Pain: 0 0 N/A Post Procedural Pain: Procedure was tolerated well Procedure was tolerated well N/A Debridement Treatment Response: 1.4x1.5x0.9 1.4x2x1.7 N/A Post Debridement Measurements L x W x D (cm) 1.484  3.738 N/A Post Debridement Volume: (cm) Debridement Debridement N/A Procedures Performed: Treatment Notes Electronic Signature(s) Signed: 12/01/2019 6:16:54 PM By: Baltazar Najjar MD Signed: 12/01/2019 6:20:53 PM By: Zenaida Deed RN, BSN Entered By: Baltazar Najjar on 12/01/2019 10:03:15 -------------------------------------------------------------------------------- Multi-Disciplinary Care Plan Details Patient Name: Date of Service: Chad Blalock MS, Barkley Boards MA S W. 12/01/2019 9:00 A M Medical Record Number: [id number] Patient Account Number: [id number] Date of Birth/Sex: Treating RN: 01/11/1951 (69 y.o. Damaris Schooner Primary Care Ichelle Harral: Kandis Fantasia, Theron Arista Other Clinician: Referring Yaziel Brandon: Treating Brean Carberry/Extender: Linton Rump in Treatment: 1 Active Inactive Nutrition Nursing Diagnoses: Impaired glucose control: actual or potential Goals: Patient/caregiver verbalizes understanding of need to maintain therapeutic glucose control per primary care physician Date Initiated: 11/24/2019 Target Resolution Date: 12/22/2019 Goal Status: Active Interventions: Provide education on elevated blood sugars and impact on wound healing Notes: Wound/Skin Impairment Nursing Diagnoses: Knowledge deficit related to ulceration/compromised skin integrity Goals: Ulcer/skin breakdown will have a volume reduction of 50% by week 8 Date Initiated: 11/24/2019 Target Resolution Date: 12/22/2019 Goal Status: Active Interventions: Provide education on ulcer and skin care Notes: Electronic Signature(s) Signed: 12/01/2019 6:20:53 PM By: Zenaida Deed RN, BSN Entered By: Zenaida Deed on 12/01/2019 09:01:45 -------------------------------------------------------------------------------- Pain Assessment Details Patient Name: Date of Service: Chad Scott MA S W. 12/01/2019 9:00 A M Medical Record Number: [id number] Patient Account Number: [id number] Date of  Birth/Sex: Treating RN: 1950-11-13 (69 y.o. Damaris Schooner Primary Care Raiford Fetterman: Kandis Fantasia, Theron Arista Other Clinician: Referring Taden Witter: Treating Ellenore Roscoe/Extender: Janifer Adie Weeks in Treatment: 1 Active Problems Location of Pain Severity and Description of Pain Patient Has Paino No Site Locations Pain Management and Medication Current Pain Management: Electronic Signature(s) Signed: 12/01/2019 11:16:13 AM By: Karl Ito Signed: 12/01/2019 6:20:53 PM By: Zenaida Deed RN, BSN Entered By: Karl Ito on 12/01/2019 08:57:47 -------------------------------------------------------------------------------- Patient/Caregiver Education Details Patient Name: Date of Service: Chad Hall 10/15/2021andnbsp9:00 A M Medical Record Number: [id number] Patient Account Number: [id number] Date of Birth/Gender: Treating RN: [birth year] (69 y.o. Damaris Schooner Primary Care Physician: Kandis Fantasia, Theron Arista Other Clinician: Referring Physician: Treating Physician/Extender: Linton Rump in Treatment: 1 Education Assessment Education Provided To: Patient Education Topics Provided Elevated Blood Sugar/ Impact on Healing: Methods: Explain/Verbal Responses: Reinforcements needed, State content correctly Wound/Skin Impairment: Methods: Explain/Verbal Responses: Reinforcements needed, State content correctly Electronic Signature(s) Signed: 12/01/2019 6:20:53 PM By: Zenaida Deed RN, BSN Entered By: Zenaida Deed on 12/01/2019 09:03:13 -------------------------------------------------------------------------------- Wound Assessment Details Patient Name: Date of Service: Chad Scott MA S W. 12/01/2019 9:00 A M Medical Record Number: [id number] Patient Account Number: [id number] Date of Birth/Sex: Treating RN: 1950-08-12 (69 y.o. Damaris Schooner Primary Care Ivyanna Sibert: Kandis Fantasia, Theron Arista Other Clinician: Referring  Elianne Gubser: Treating Mikell Camp/Extender: Mikey College NK, PETER Weeks in Treatment: 1 Wound Status Wound Number: 1 Primary Etiology: Diabetic Wound/Ulcer of the Lower Extremity Wound Location: Left, Dorsal Foot Wound Status: Open Wounding Event: Gradually Appeared Comorbid History: Hypertension, Type II Diabetes, Neuropathy Date Acquired: 08/17/2019 Weeks Of Treatment: 1 Clustered  Wound: No Wound Measurements Length: (cm) 10.5 Width: (cm) 9 Depth: (cm) 0.1 Area: (cm) 74.22 Volume: (cm) 7.422 % Reduction in Area: 4.5% % Reduction in Volume: 4.5% Epithelialization: Small (1-33%) Tunneling: No Undermining: No Wound Description Classification: Grade 2 Wound Margin: Distinct, outline attached Exudate Amount: Large Exudate Type: Serosanguineous Exudate Color: red, brown Wound Bed Granulation Amount: Large (67-100%) Granulation Quality: Red, Pink Necrotic Amount: Small (1-33%) Necrotic Quality: Adherent Slough Foul Odor After Cleansing: No Slough/Fibrino Yes Exposed Structure Fascia Exposed: No Fat Layer (Subcutaneous Tissue) Exposed: Yes Tendon Exposed: No Muscle Exposed: No Joint Exposed: No Bone Exposed: No Treatment Notes Wound #1 (Left, Dorsal Foot) 1. Cleanse With Wound Cleanser Soap and water 2. Periwound Care Barrier cream Moisturizing lotion 3. Primary Dressing Applied Hydrofera Blue 4. Secondary Dressing ABD Pad Dry Gauze 6. Support Layer Applied 3 layer compression wrap Notes netting and stockingette. Electronic Signature(s) Signed: 12/01/2019 5:54:53 PM By: Shawn Stall Signed: 12/01/2019 6:20:53 PM By: Zenaida Deed RN, BSN Entered By: Shawn Stall on 12/01/2019 09:22:29 -------------------------------------------------------------------------------- Wound Assessment Details Patient Name: Date of Service: Chad Blalock MS, Barkley Boards MA S W. 12/01/2019 9:00 A M Medical Record Number: [id number] Patient Account Number: [id number] Date of Birth/Sex:  Treating RN: 02-Aug-1950 (69 y.o. 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 Location: Left Amputation Site - Toe Wound Status: Open Wounding Event: Gradually Appeared Comorbid History: Hypertension, Type II Diabetes, Neuropathy Date Acquired: 01/17/2019 Weeks Of Treatment: 1 Clustered Wound: No Wound Measurements Length: (cm) 4.5 Width: (cm) 1 Depth: (cm) 0.4 Area: (cm) 3.534 Volume: (cm) 1.414 % Reduction in Area: 75% % Reduction in Volume: 0% Epithelialization: Small (1-33%) Tunneling: No Undermining: No Wound Description Classification: Grade 2 Wound Margin: Distinct, outline attached Exudate Amount: Medium Exudate Type: Serosanguineous Exudate Color: red, brown Wound Bed Granulation Amount: Small (1-33%) Granulation Quality: Red, Pink Necrotic Amount: Large (67-100%) Necrotic Quality: Adherent Slough Foul Odor After Cleansing: No Slough/Fibrino Yes Exposed Structure Fascia Exposed: No Fat Layer (Subcutaneous Tissue) Exposed: Yes Tendon Exposed: No Muscle Exposed: No Joint Exposed: No Bone Exposed: No Treatment Notes Wound #2 (Left Amputation Site - Toe) 1. Cleanse With Wound Cleanser Soap and water 2. Periwound Care Barrier cream Moisturizing lotion 3. Primary Dressing Applied Hydrofera Blue 4. Secondary Dressing ABD Pad Dry Gauze 6. Support Layer Applied 3 layer compression wrap Notes netting and stockingette. Electronic Signature(s) Signed: 12/01/2019 5:54:53 PM By: Shawn Stall Signed: 12/01/2019 6:20:53 PM By: Zenaida Deed RN, BSN Entered By: Shawn Stall on 12/01/2019 09:22:37 -------------------------------------------------------------------------------- Wound Assessment Details Patient Name: Date of  Service: Chad Blalock MS, Barkley Boards MA S W. 12/01/2019 9:00 A M Medical Record Number: [id number] Patient Account Number: [id number] Date of Birth/Sex: Treating RN: [birth year] (69 y.o. Damaris Schooner Primary Care Manasvi Dickard: Kandis Fantasia, PETER Other Clinician: Referring Jamale Spangler: Treating Aralyn Nowak/Extender: Mikey College NK, PETER Weeks in Treatment: 1 Wound Status Wound Number: 3 Primary Etiology: Diabetic Wound/Ulcer of the Lower Extremity Wound Location: Left T Fourth oe Wound Status: Open Wounding Event: Gradually Appeared Comorbid History: Hypertension, Type II Diabetes, Neuropathy Date Acquired: 09/17/2019 Weeks Of Treatment: 1 Clustered Wound: No Wound Measurements Length: (cm) 0.5 Width: (cm) 1.1 Depth: (cm) 0.7 Area: (cm) 0.432 Volume: (cm) 0.302 % Reduction in Area: 90.8% % Reduction in Volume: 35.9% Epithelialization: Small (1-33%) Tunneling: Yes Position (o'clock): 6 Maximum Distance: (cm) 1 Undermining: No  Wound Description Classification: Grade 2 Wound Margin: Distinct, outline attached Exudate Amount: Medium Exudate Type: Serosanguineous Exudate Color: red, brown Foul Odor After Cleansing: No Slough/Fibrino Yes Wound Bed Granulation Amount: Small (1-33%) Exposed Structure Granulation Quality: Red, Pink Fascia Exposed: No Necrotic Amount: Large (67-100%) Fat Layer (Subcutaneous Tissue) Exposed: Yes Necrotic Quality: Adherent Slough Tendon Exposed: No Muscle Exposed: No Joint Exposed: No Bone Exposed: No Treatment Notes Wound #3 (Left Toe Fourth) 1. Cleanse With Wound Cleanser Soap and water 2. Periwound Care Barrier cream Moisturizing lotion 3. Primary Dressing Applied Hydrofera Blue 4. Secondary Dressing ABD Pad Dry Gauze 6. Support Layer Applied 3 layer compression wrap Notes netting and stockingette. Electronic Signature(s) Signed: 12/01/2019 5:54:53 PM By: Shawn Stall Signed: 12/01/2019 6:20:53 PM By: Zenaida Deed RN, BSN Entered By:  Shawn Stall on 12/01/2019 09:22:43 -------------------------------------------------------------------------------- Wound Assessment Details Patient Name: Date of Service: Chad Blalock MS, Barkley Boards MA S W. 12/01/2019 9:00 A M Medical Record Number: [id number] Patient Account Number: [id number] Date of Birth/Sex: Treating RN: 08/01/1950 (69 y.o. Damaris Schooner Primary Care Aniyiah Zell: Kandis Fantasia, PETER Other Clinician: Referring Elissa Grieshop: Treating Briasia Flinders/Extender: Mikey College NK, PETER Weeks in Treatment: 1 Wound Status Wound Number: 4 Primary Etiology: Diabetic Wound/Ulcer of the Lower Extremity Wound Location: Left Metatarsal head fourth Wound Status: Open Wounding Event: Gradually Appeared Comorbid History: Hypertension, Type II Diabetes, Neuropathy Date Acquired: 08/17/2019 Weeks Of Treatment: 1 Clustered Wound: No Wound Measurements Length: (cm) 1.4 Width: (cm) 1.5 Depth: (cm) 0.9 Area: (cm) 1.649 Volume: (cm) 1.484 % Reduction in Area: -16.6% % Reduction in Volume: -952.5% Epithelialization: Small (1-33%) Tunneling: No Undermining: No Wound Description Classification: Grade 2 Wound Margin: Distinct, outline attached Exudate Amount: Large Exudate Type: Serosanguineous Exudate Color: red, brown Foul Odor After Cleansing: No Slough/Fibrino Yes Wound Bed Granulation Amount: Medium (34-66%) Exposed Structure Granulation Quality: Red, Pink Fascia Exposed: No Necrotic Amount: Medium (34-66%) Fat Layer (Subcutaneous Tissue) Exposed: Yes Necrotic Quality: Adherent Slough Tendon Exposed: No Muscle Exposed: No Joint Exposed: No Bone Exposed: No Treatment Notes Wound #4 (Left Metatarsal head fourth) 1. Cleanse With Wound Cleanser Soap and water 2. Periwound Care Barrier cream Moisturizing lotion 3. Primary Dressing Applied Hydrofera Blue 4. Secondary Dressing ABD Pad Dry Gauze 6. Support Layer Applied 3 layer compression wrap Notes netting and  stockingette. Electronic Signature(s) Signed: 12/01/2019 5:54:53 PM By: Shawn Stall Signed: 12/01/2019 6:20:53 PM By: Zenaida Deed RN, BSN Entered By: Shawn Stall on 12/01/2019 09:21:20 -------------------------------------------------------------------------------- Wound Assessment Details Patient Name: Date of Service: Chad Blalock MS, Barkley Boards MA S W. 12/01/2019 9:00 A M Medical Record Number: [id number] Patient Account Number: [id number] Date of Birth/Sex: Treating RN: April 01, 1950 (69 y.o. Tammy Sours Primary Care Delonte Musich: Kandis Fantasia, Theron Arista Other Clinician: Referring Livan Hires: Treating Kanya Potteiger/Extender: Mikey College NK, PETER Weeks in Treatment: 1 Wound Status Wound Number: 5 Primary Etiology: Diabetic Wound/Ulcer of the Lower Extremity Wound Location: Left Calcaneus Wound Status: Open Wounding Event: Gradually Appeared Comorbid History: Hypertension, Type II Diabetes, Neuropathy Date Acquired: 09/17/2019 Weeks Of Treatment: 1 Clustered Wound: No Wound Measurements Length: (cm) 1.4 Width: (cm) 2 Depth: (cm) 1.7 Area: (cm) 2.199 Volume: (cm) 3.738 Wound Description Classification: Grade 2 Wound Margin: Distinct, outline attached Exudate Amount: Large Exudate Type: Serosanguineous Exudate Color: red, brown Foul Odor After Cleansing: Slough/Fibrino % Reduction in Area: 25.3% % Reduction in Volume: -81.3% Epithelialization: Small (1-33%) Tunneling: No Undermining: No No Yes Wound Bed Granulation Amount: Medium (34-66%) Exposed Structure Granulation Quality: Red, Pink Fascia Exposed: No Necrotic Amount: Medium (34-66%) Fat  Layer (Subcutaneous Tissue) Exposed: Yes Necrotic Quality: Adherent Slough Tendon Exposed: No Muscle Exposed: No Joint Exposed: No Bone Exposed: No Treatment Notes Wound #5 (Left Calcaneus) 1. Cleanse With Wound Cleanser Soap and water 2. Periwound Care Barrier cream Moisturizing lotion 3. Primary Dressing Applied Hydrofera  Blue 4. Secondary Dressing Dry Gauze Heel Cup 6. Support Layer Applied 3 layer compression wrap Notes stockinette and netting applied to foot and leg. Electronic Signature(s) Signed: 12/01/2019 5:54:53 PM By: Shawn Stall Entered By: Shawn Stall on 12/01/2019 09:22:22 -------------------------------------------------------------------------------- Vitals Details Patient Name: Date of Service: Chad Blalock MS, THO MA S W. 12/01/2019 9:00 A M Medical Record Number: [id number] Patient Account Number: [id number] Date of Birth/Sex: Treating RN: 25-Oct-1950 (69 y.o. Damaris Schooner Primary Care Rhylee Nunn: Kandis Fantasia, PETER Other Clinician: Referring Shantera Monts: Treating Zanayah Shadowens/Extender: [employer] NK, PETER Weeks in Treatment: 1 Vital Signs Time Taken: 08:57 Temperature (F): 98.5 Height (in): 77 Pulse (bpm): 60 Weight (lbs): 210 Respiratory Rate (breaths/min): 18 Body Mass Index (BMI): 24.9 Blood Pressure (mmHg): 159/76 Reference Range: 80 - 120 mg / dl Electronic Signature(s) Signed: 12/01/2019 11:16:13 AM By: Karl Ito Entered By: Karl Ito on 12/01/2019 08:57:41

## 2019-12-01 NOTE — Progress Notes (Signed)
Chad Hall ([id number]) Visit Report for 12/01/2019 Debridement Details Patient Name: Date of Service: Chad Hall [place]. 12/01/2019 9:00 A M Medical Record Number: [id number] Patient Account Number: [id number] Date of Birth/Sex: Treating RN: 05-26-1950 (69 y.o. Chad Hall Primary Care Provider: Myrle Hall, Chad Hall Other Clinician: Referring Provider: Treating Provider/Extender: Chad Hall in Treatment: 1 Debridement Performed for Assessment: Wound #2 Left Amputation Site - Toe Performed By: Physician Chad Hall., MD Debridement Type: Debridement Severity of Tissue Pre Debridement: Fat layer exposed Level of Consciousness (Pre-procedure): Awake and Alert Pre-procedure Verification/Time Out Yes - 09:35 Taken: Start Time: 09:35 Pain Control: Other : benzocaine 20% spray T Area Debrided (L x W): otal 4.5 (cm) x 1 (cm) = 4.5 (cm) Tissue and other material debrided: Viable, Non-Viable, Slough, Subcutaneous, Slough Level: Skin/Subcutaneous Tissue Debridement Description: Excisional Instrument: Curette Bleeding: Minimum Hemostasis Achieved: Pressure End Time: 09:40 Procedural Pain: 0 Post Procedural Pain: 0 Response to Treatment: Procedure was tolerated well Level of Consciousness (Post- Awake and Alert procedure): Post Debridement Measurements of Total Wound Length: (cm) 4.5 Width: (cm) 1.9 Depth: (cm) 0.4 Volume: (cm) 2.686 Character of Wound/Ulcer Post Debridement: Improved Severity of Tissue Post Debridement: Fat layer exposed Post Procedure Diagnosis Same as Pre-procedure Electronic Signature(s) Signed: 12/01/2019 6:16:54 PM By: Chad Ham MD Signed: 12/01/2019 6:20:53 PM By: Chad Gouty RN, BSN Entered By: Chad Hall on 12/01/2019 10:03:25 -------------------------------------------------------------------------------- Debridement Details Patient Name: Date of Service: Chad Bishop [place], [name] MA S W. 12/01/2019  9:00 A M Medical Record Number: [id number] Patient Account Number: [id number] Date of Birth/Sex: Treating RN: 1950-07-30 (69 y.o. Chad Hall Primary Care Provider: Myrle Hall, Chad Hall Other Clinician: Referring Provider: Treating Provider/Extender: Chad Hall in Treatment: 1 Debridement Performed for Assessment: Wound #4 Left Metatarsal head fourth Performed By: Physician Chad Hall., MD Debridement Type: Debridement Severity of Tissue Pre Debridement: Fat layer exposed Level of Consciousness (Pre-procedure): Awake and Alert Pre-procedure Verification/Time Out Yes - 09:35 Taken: Start Time: 09:35 Pain Control: Other : benzocaine 20% spray T Area Debrided (L x W): otal 1.4 (cm) x 1.5 (cm) = 2.1 (cm) Tissue and other material debrided: Viable, Non-Viable, Slough, Subcutaneous, Slough Level: Skin/Subcutaneous Tissue Debridement Description: Excisional Instrument: Curette Bleeding: Minimum Hemostasis Achieved: Pressure End Time: 09:40 Procedural Pain: 0 Post Procedural Pain: 0 Response to Treatment: Procedure was tolerated well Level of Consciousness (Post- Awake and Alert procedure): Post Debridement Measurements of Total Wound Length: (cm) 1.4 Width: (cm) 1.5 Depth: (cm) 0.9 Volume: (cm) 1.484 Character of Wound/Ulcer Post Debridement: Improved Severity of Tissue Post Debridement: Fat layer exposed Post Procedure Diagnosis Same as Pre-procedure Electronic Signature(s) Signed: 12/01/2019 6:16:54 PM By: Chad Ham MD Signed: 12/01/2019 6:20:53 PM By: Chad Gouty RN, BSN Entered By: Chad Hall on 12/01/2019 10:03:32 -------------------------------------------------------------------------------- Debridement Details Patient Name: Date of Service: Chad Bishop MS, [name] MA S W. 12/01/2019 9:00 A M Medical Record Number: [id number] Patient Account Number: [id number] Date of Birth/Sex: Treating RN: 27-Feb-1950 (69 y.o. Chad Hall Primary Care Provider: Myrle Hall, Chad Hall Other Clinician: Referring Provider: Treating Provider/Extender: Chad Hall in Treatment: 1 Debridement Performed for Assessment: Wound #5 Left Calcaneus Performed By: Physician Chad Hall., MD Debridement Type: Debridement Severity of Tissue Pre Debridement: Fat layer exposed Level of Consciousness (Pre-procedure): Awake and Alert Pre-procedure Verification/Time Out Yes - 09:35 Taken: Start Time: 09:35 Pain Control: Other : benzocaine 20% spray T Area Debrided (L x W): otal 1.4 (  cm) x 2 (cm) = 2.8 (cm) Tissue and other material debrided: Viable, Non-Viable, Slough, Subcutaneous, Slough Level: Skin/Subcutaneous Tissue Debridement Description: Excisional Instrument: Curette Bleeding: Minimum Hemostasis Achieved: Pressure End Time: 09:40 Procedural Pain: 0 Post Procedural Pain: 0 Response to Treatment: Procedure was tolerated well Level of Consciousness (Post- Awake and Alert procedure): Post Debridement Measurements of Total Wound Length: (cm) 1.4 Width: (cm) 2 Depth: (cm) 1.7 Volume: (cm) 3.738 Character of Wound/Ulcer Post Debridement: Improved Severity of Tissue Post Debridement: Fat layer exposed Post Procedure Diagnosis Same as Pre-procedure Electronic Signature(s) Signed: 12/01/2019 6:16:54 PM By: Chad Ham MD Signed: 12/01/2019 6:20:53 PM By: Chad Gouty RN, BSN Entered By: Chad Hall on 12/01/2019 10:03:40 -------------------------------------------------------------------------------- HPI Details Patient Name: Date of Service: Chad Bishop [place], [name] MA S W. 12/01/2019 9:00 A M Medical Record Number: [id number] Patient Account Number: [id number] Date of Birth/Sex: Treating RN: December 01, 1950 (69 y.o. Chad Hall Primary Care Provider: Myrle Hall, Chad Hall Other Clinician: Referring Provider: Treating Provider/Extender: Chad Hall in Treatment: 1 History  of Present Illness HPI Description: ADMISSION 11/24/2019 [name] is a 69 year old man with type 2 diabetes well controlled peripheral neuropathy and blindness secondary to remote bilateral retinal detachments I believe. His problem with regards to his left foot began it in November 2020. He developed osteomyelitis of his left toe/MTP and underwent a left third ray amputation by Dr. Sharol Given. He developed a wound dehiscence by early December and he was followed for this by Dr. Jess Barters office for most of this year. Most recently he has been using silver alginate. Trental and nitroglycerin patches have also been used. Early in September it was determined that the third grade dehiscence had closed over but for the first time Hall of a wound on the fourth toe, fourth met head and left plantar heel. Dr. Sharol Given had talked about a transmetatarsal amputation but that certainly would not address the very significant area on his heel now and also there was some conversation about a BKA. The patient is here to see what can be done to save his foot The patient is a well-controlled type II diabetic on oral agents. He also has severe chronic venous insufficiency, Marfan's, hyperlipidemia, thoracic aortic aneurysm, blindness. 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 He was on his feet for quite a period of time but he tells me he is not working currently. His ABI in our clinic was 1.03 on the left he was not felt in Dr. Jess Barters office based on bedside Dopplers to have arterial insufficiency. This seems quite correct 10/15 this is a patient who has 3 open wounds at surgical site wound on a third ray amputation site, and area over the fourth met head and a deep area over the plantar left heel. His MRI is booked for 10/20. Based on the MRI we will need to make a decision whether we attempt surgical or medical treatment for underlying infection if this is present versus offloading with a  total contact cast. Even the total contact cast might have obstacles given his visual issues/balance. He lives with his wife who is also visually impaired. He takes Scat into the clinic. He also has severe chronic venous insufficiency which I think is added to the difficulties in healing of the surgical wound largely in the dorsal foot just proximal to the third toe amputation. We have been using silver alginate. I changed him to [facility] today Electronic Signature(s) Signed: 12/01/2019 6:16:54 PM  By: Chad Ham MD Entered By: Chad Hall on 12/01/2019 10:06:36 -------------------------------------------------------------------------------- Physical Exam Details Patient Name: Date of Service: Chad Hall, [name] MA S W. 12/01/2019 9:00 A M Medical Record Number: [id number] Patient Account Number: [id number] Date of Birth/Sex: Treating RN: 11/15/1950 (69 y.o. Chad Hall Primary Care Provider: Other Clinician: Lyndal Pulley Referring Provider: Treating Provider/Extender: Burnis Kingfisher NK, PETER Hall in Treatment: 1 Constitutional Patient is hypertensive.. Pulse regular and within target range for patient.Marland Kitchen Respirations regular, non-labored and within target range.. Temperature is normal and within the target range for the patient.Marland Kitchen Appears in no distress. Notes Wound exam Third ray amputation site has the dorsal part of this incision still open although I think this is contracted. Still had a fibrinous debris which I debrided with a #5 curette but generally this looks better The area over her is fourth toe is deep and punched out. He has what looks to be filled in tissue however this undermines all around this area. I used a #5 curette to remove surface from this. A deeper debridement may be necessary Finally has a deep punched out area over the mid part of the plantar heel. This does not go to bone as far as I can tell. Necrotic tissue debrided with a #5 curette  hemostasis with a pressure dressing A lot of the superficial wounds on his dorsal foot it healed with compression. These were venous hypertension issues with secondary lymphedema Electronic Signature(s) Signed: 12/01/2019 6:16:54 PM By: Chad Ham MD Entered By: Chad Hall on 12/01/2019 10:08:26 -------------------------------------------------------------------------------- Physician Orders Details Patient Name: Date of Service: Chad Hall, [name] MA S W. 12/01/2019 9:00 A M Medical Record Number: [id number] Patient Account Number: [id number] Date of Birth/Sex: Treating RN: 07/24/1950 (69 y.o. 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 Dressing Change Frequency Do not change entire dressing for one week. Skin Barriers/Peri-Wound Care Barrier cream - to macerated areas of foot Moisturizing lotion - to leg Wound Cleansing Wound #1 Left,Dorsal Foot May shower with protection. - cast protector Primary Wound Dressing Wound #1 Left,Dorsal Foot Hydrofera Blue - classic Wound #2 Left Amputation Site - Toe Hydrofera Blue - classic Wound #3 Left T Fourth oe Hydrofera Blue - classic Wound #4 Left Metatarsal head fourth Hydrofera Blue - classic Wound #5 Left Calcaneus Hydrofera Blue -  classic Secondary Dressing Wound #1 Left,Dorsal Foot Dry Gauze ABD pad Wound #2 Left Amputation Site - Toe Dry Gauze Wound #3 Left T Fourth oe Dry Gauze Wound #4 Left Metatarsal head fourth Dry Gauze Wound #5 Left Calcaneus Dry Gauze Heel Cup Edema Control 3 Layer Compression System - Left Lower Extremity - stockinnette to cover all toes so patient does not have to change dressing Avoid standing for long periods of time Elevate legs to the level of the heart or above for 30 minutes daily and/or when sitting, a frequency of: Off-Loading Open toe surgical shoe to: Patient Medications llergies: No Known Drug  Intolerances A Notifications Medication Indication Start End prior to debridement 12/01/2019 benzocaine DOSE topical 20 % aerosol - aerosol topical Electronic Signature(s) Signed: 12/01/2019 6:16:54 PM By: Chad Ham MD Signed: 12/01/2019 6:20:53 PM By: Chad Gouty RN, BSN Entered By: Chad Hall on 12/01/2019 09:48:01 -------------------------------------------------------------------------------- Problem List Details Patient Name: Date of Service: Chad Bishop MS, [name] MA S W. 12/01/2019 9:00 A M Medical Record Number: [id number] Patient Account Number: [id number] Date of Birth/Sex: Treating RN: 1950-12-28 (69 y.o. 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 12/01/2019 9:00 A M Medical Record Number: [id number] Patient Account Number: [id number] Date of Birth/Sex: Treating RN: Feb 27, 1950 (69 y.o. Chad Hall Primary Care Provider: Myrle Hall, Chad Hall Other Clinician: Referring Provider: Treating Provider/Extender: Chad Hall in Treatment: 1 Subjective History of Present Illness (HPI) ADMISSION 11/24/2019 Mr. Valbuena is a 69 year old man with type 2 diabetes well controlled peripheral neuropathy and blindness secondary to remote bilateral retinal detachments I believe. His problem with regards to his left foot began it in November 2020. He developed osteomyelitis of his left toe/MTP and underwent a left third ray amputation by Dr. Sharol Given. He developed a wound dehiscence by early December and he was followed for this by Dr. Jess Barters office for most of this year. Most recently he has been using silver alginate. Trental and nitroglycerin patches have also been used. Early in September it was determined that the third grade dehiscence had closed over but for the first time Hall of a wound on the fourth toe, fourth met head and left plantar heel. Dr. Sharol Given had talked about a transmetatarsal amputation but that certainly would not address the very significant area on his heel now and also there was some conversation about a BKA. The patient is here to see what can be done to save his foot The patient is a well-controlled type II diabetic on oral agents. He also has severe chronic  venous insufficiency, Marfan's, hyperlipidemia, thoracic aortic aneurysm, blindness. 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 He was on his feet for quite a period of time but he tells me he is not working currently. His ABI in our clinic was 1.03 on the left he was not felt in Dr. Jess Barters office based on bedside Dopplers to have arterial insufficiency. This seems quite correct 10/15 this is a patient who has 3 open wounds at surgical site wound on a third ray amputation site, and area over the fourth met head and a deep area over the plantar left heel. His MRI is booked for 10/20. Based on the MRI we will need to make a decision whether we attempt surgical or medical treatment for  underlying infection if this is present versus offloading with a total contact cast. Even the total contact cast might have obstacles given his visual issues/balance. He lives with his wife who is also visually impaired. He takes Scat into the clinic. He also has severe chronic venous insufficiency which I think is added to the difficulties in healing of the surgical wound largely in the dorsal foot just proximal to the third toe amputation. We have been using silver alginate. I changed him to [facility] today Objective Constitutional Patient is hypertensive.. Pulse regular and within target range for patient.Marland Kitchen Respirations regular, non-labored and within target range.. Temperature is normal and within the target range for the patient.Marland Kitchen Appears in no distress. Vitals Time Taken: 8:57 AM, Height: 77 in, Weight: 210 lbs, BMI: 24.9, Temperature: 98.5 F, Pulse: 60 bpm, Respiratory Rate: 18 breaths/min, Blood Pressure: 159/76 mmHg. General Notes: Wound exam ooThird ray amputation site has the dorsal part of this incision still open although I think this is contracted. Still had a fibrinous debris which I debrided with a #5 curette but generally this looks better ooThe area over her is  fourth toe is deep and punched out. He has what looks to be filled in tissue however this undermines all around this area. I used a #5 curette to remove surface from this. A deeper debridement may be necessary ooFinally has a deep punched out area over the mid part of the plantar heel. This does not go to bone as far as I can tell. Necrotic tissue debrided with a #5 curette hemostasis with a pressure dressing ooA lot of the superficial wounds on his dorsal foot it healed with compression. These were venous hypertension issues with secondary lymphedema Integumentary (Hair, Skin) Wound #1 status is Open. Original cause of wound was Gradually Appeared. The wound is located on the Left,Dorsal Foot. The wound measures 10.5cm length x 9cm width x 0.1cm depth; 74.22cm^2 area and 7.422cm^3 volume. There is Fat Layer (Subcutaneous Tissue) exposed. There is no tunneling or undermining noted. There is a large amount of serosanguineous drainage noted. The wound margin is distinct with the outline attached to the wound base. There is large (67- 100%) red, pink granulation within the wound bed. There is a small (1-33%) amount of necrotic tissue within the wound bed including Adherent Slough. Wound #2 status is Open. Original cause of wound was Gradually Appeared. The wound is located on the Left Amputation Site - T The wound measures oe. 4.5cm length x 1cm width x 0.4cm depth; 3.534cm^2 area and 1.414cm^3 volume. There is Fat Layer (Subcutaneous Tissue) exposed. There is no tunneling or undermining noted. There is a medium amount of serosanguineous drainage noted. The wound margin is distinct with the outline attached to the wound base. There is small (1-33%) red, pink granulation within the wound bed. There is a large (67-100%) amount of necrotic tissue within the wound bed including Adherent Slough. Wound #3 status is Open. Original cause of wound was Gradually Appeared. The wound is located on the Left T  Fourth. The wound measures 0.5cm length x oe 1.1cm width x 0.7cm depth; 0.432cm^2 area and 0.302cm^3 volume. There is Fat Layer (Subcutaneous Tissue) exposed. There is no undermining noted, however, there is tunneling at 6:00 with a maximum distance of 1cm. There is a medium amount of serosanguineous drainage noted. The wound margin is distinct with the outline attached to the wound base. There is small (1-33%) red, pink granulation within the wound bed. There is a  large (67-100%) amount of necrotic tissue within the wound bed including Adherent Slough. Wound #4 status is Open. Original cause of wound was Gradually Appeared. The wound is located on the Left Metatarsal head fourth. The wound measures 1.4cm length x 1.5cm width x 0.9cm depth; 1.649cm^2 area and 1.484cm^3 volume. There is Fat Layer (Subcutaneous Tissue) exposed. There is no tunneling or undermining noted. There is a large amount of serosanguineous drainage noted. The wound margin is distinct with the outline attached to the wound base. There is medium (34-66%) red, pink granulation within the wound bed. There is a medium (34-66%) amount of necrotic tissue within the wound bed including Adherent Slough. Wound #5 status is Open. Original cause of wound was Gradually Appeared. The wound is located on the Left Calcaneus. The wound measures 1.4cm length x 2cm width x 1.7cm depth; 2.199cm^2 area and 3.738cm^3 volume. There is Fat Layer (Subcutaneous Tissue) exposed. There is no tunneling or undermining noted. There is a large amount of serosanguineous drainage noted. The wound margin is distinct with the outline attached to the wound base. There is medium (34-66%) red, pink granulation within the wound bed. There is a medium (34-66%) amount of necrotic tissue within the wound bed including Adherent Slough. 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 There was a Excisional Skin/Subcutaneous Tissue Debridement with a total area of 4.5 sq cm performed by Chad Hall., MD. With the following instrument(s): Curette to remove Viable and Non-Viable tissue/material. Material removed includes Subcutaneous Tissue and Slough and after achieving pain control using Other (benzocaine 20% spray). No specimens were taken. A time out was conducted at 09:35, prior to the start of the procedure. A Minimum amount of bleeding was controlled with Pressure. The procedure was tolerated well with a pain level of 0 throughout and a pain level of 0 following the procedure. Post Debridement Measurements: 4.5cm length x 1.9cm width x 0.4cm depth; 2.686cm^3 volume. Character of Wound/Ulcer Post Debridement is improved. Severity of Tissue Post Debridement is: Fat layer exposed. Post procedure Diagnosis Wound #2: Same as Pre-Procedure Wound #4 Pre-procedure diagnosis of Wound #4 is a Diabetic Wound/Ulcer of the Lower Extremity located on the Left Metatarsal head fourth .Severity of Tissue Pre Debridement is: Fat layer exposed. There was a Excisional Skin/Subcutaneous Tissue Debridement with a total area of 2.1 sq cm performed by Chad Hall., MD. With the following instrument(s): Curette to remove Viable and Non-Viable tissue/material. Material removed includes Subcutaneous Tissue and Slough and after achieving pain control using Other (benzocaine 20% spray). No  specimens were taken. A time out was conducted at 09:35, prior to the start of the procedure. A Minimum amount of bleeding was controlled with Pressure. The procedure was tolerated well with a pain level of 0 throughout and a pain level of 0 following the procedure. Post Debridement Measurements: 1.4cm length x 1.5cm width x 0.9cm depth; 1.484cm^3 volume. Character of Wound/Ulcer Post Debridement is improved. Severity of Tissue Post Debridement is: Fat layer exposed. Post  procedure Diagnosis Wound #4: Same as Pre-Procedure Wound #5 Pre-procedure diagnosis of Wound #5 is a Diabetic Wound/Ulcer of the Lower Extremity located on the Left Calcaneus .Severity of Tissue Pre Debridement is: Fat layer exposed. There was a Excisional Skin/Subcutaneous Tissue Debridement with a total area of 2.8 sq cm performed by Chad Hall., MD. With the following instrument(s): Curette to remove Viable and Non-Viable tissue/material. Material removed includes Subcutaneous Tissue and Slough and after achieving pain control using Other (benzocaine 20% spray). No specimens were taken. A time out was conducted at 09:35, prior to the start of the procedure. A Minimum amount of bleeding was controlled with Pressure. The procedure was tolerated well with a pain level of 0 throughout and a pain level of 0 following the procedure. Post Debridement Measurements: 1.4cm length x 2cm width x 1.7cm depth; 3.738cm^3 volume. Character of Wound/Ulcer Post Debridement is improved. Severity of Tissue Post Debridement is: Fat layer exposed. Post procedure Diagnosis Wound #5: Same as Pre-Procedure Wound #1 Pre-procedure diagnosis of Wound #1 is a Diabetic Wound/Ulcer of the Lower Extremity located on the Left,Dorsal Foot . There was a Three Layer Compression Therapy Procedure by Deon Pilling, RN. Post procedure Diagnosis Wound #1: Same as Pre-Procedure Plan Follow-up Appointments: Return Appointment in 1 week. Dressing Change  Frequency: Do not change entire dressing for one week. Skin Barriers/Peri-Wound Care: Barrier cream - to macerated areas of foot Moisturizing lotion - to leg Wound Cleansing: Wound #1 Left,Dorsal Foot: May shower with protection. - cast protector Primary Wound Dressing: Wound #1 Left,Dorsal Foot: Hydrofera Blue - classic Wound #2 Left Amputation Site - Toe: Hydrofera Blue - classic Wound #3 Left T Fourth: oe Hydrofera Blue - classic Wound #4 Left Metatarsal head fourth: Hydrofera Blue - classic Wound #5 Left Calcaneus: Hydrofera Blue - classic Secondary Dressing: Wound #1 Left,Dorsal Foot: Dry Gauze ABD pad Wound #2 Left Amputation Site - Toe: Dry Gauze Wound #3 Left T Fourth: oe Dry Gauze Wound #4 Left Metatarsal head fourth: Dry Gauze Wound #5 Left Calcaneus: Dry Gauze Heel Cup Edema Control: 3 Layer Compression System - Left Lower Extremity - stockinnette to cover all toes so patient does not have to change dressing Avoid standing for long periods of time Elevate legs to the level of the heart or above for 30 minutes daily and/or when sitting, a frequency of: Off-Loading: Open toe surgical shoe to: The following medication(s) was prescribed: benzocaine topical 20 % aerosol aerosol topical for prior to debridement was prescribed at facility 1. I change the dressing to [facility] so this would not interfere with the MRI 2. I have him in compression to control the edema in his leg the venous hypertension and lymphedema into the dorsal foot 3. If the MRI is positive for osteomyelitis I will need a discussion about approach here which would include medical treatment including IV antibiotics and HBO versus possibly a below-knee amputation. I will believe that Dr. Sharol Given had already talked about a transmetatarsal amputation but this would not deal with the plantar heel wound. We will just have to see if and where there is infection in the bone. 4. The degree of edema here  is well controlled. His skin already looks better in the lower leg and dorsal foot. Much less inflammation and dry flaking skin Electronic Signature(s) Signed: 12/01/2019 6:16:54 PM By: Chad Ham MD Entered By: Chad Hall on 12/01/2019 10:09:53 -------------------------------------------------------------------------------- SuperBill Details Patient Name: Date of Service: Chad Bishop MS, [place] MA S W. 12/01/2019 Medical Record  Number: [id number] Patient Account Number: [id number] Date of Birth/Sex: Treating RN: January 28, 1951 (69 y.o. 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 L97.528 Non-pressure chronic ulcer of other part of left foot with other specified severity I87.332 Chronic venous hypertension (idiopathic) with ulcer and inflammation of left lower extremity Facility Procedures CPT4 Code: [id number] Description: 30160 - DEB SUBQ TISSUE 20 SQ CM/< ICD-10 Diagnosis Description 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 T81.31XD Disruption of external operation (surgical) wound, not elsewhere classified, subs  L97.528 Non-pressure chronic ulcer of other part of left foot with other specified severi Modifier: equent encounter ty Quantity: 1 Physician Procedures : CPT4 Code Description Modifier [id number] 11042 - WC PHYS SUBQ TISS 20 SQ CM ICD-10 Diagnosis Description 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

## 2019-12-06 ENCOUNTER — Ambulatory Visit (HOSPITAL_COMMUNITY)
Admission: RE | Admit: 2019-12-06 | Discharge: 2019-12-06 | Disposition: A | Discharge status: Home / Self Care | Payer: Medicare Other | Source: Ambulatory Visit | Attending: Internal Medicine | Admitting: Internal Medicine

## 2019-12-06 ENCOUNTER — Other Ambulatory Visit: Payer: Self-pay

## 2019-12-06 DIAGNOSIS — S91302A Unspecified open wound, left foot, initial encounter: Secondary | ICD-10-CM

## 2019-12-06 IMAGING — MR MR FOOT*L* WO/W CM
9 series · 40 of 40 positions shown · IV contrast (Gadavist)
Comparison: X-ray [DATE]

CLINICAL DATA: Diabetic left heel ulceration

EXAM:
MRI OF THE LEFT HINDFOOT WITHOUT AND WITH CONTRAST
TECHNIQUE: Multiplanar, multisequence MR imaging of the left hindfoot was
performed both before and after administration of intravenous
contrast.
CONTRAST:  9mL GADAVIST GADOBUTROL 1 MMOL/ML IV SOLN

[Series 5: T1 · axial · left · 3.0mm · 0.47mm/px · z∈[-63,+73]mm · 5 of 40 slices shown (1 of 2)]
[im 1/40]
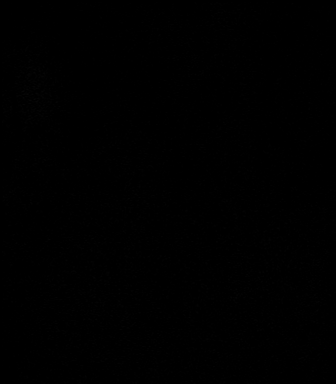
[im 10/40]
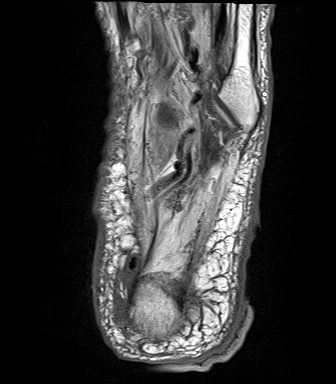
[im 20/40]
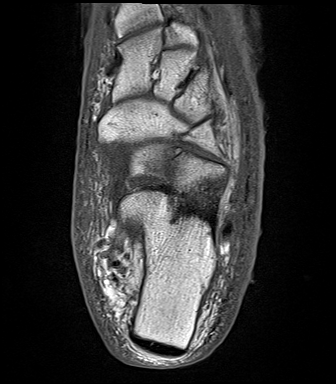
[im 30/40]
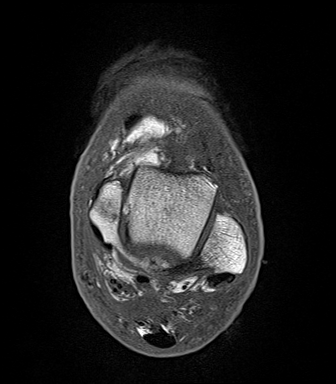
[im 40/40]
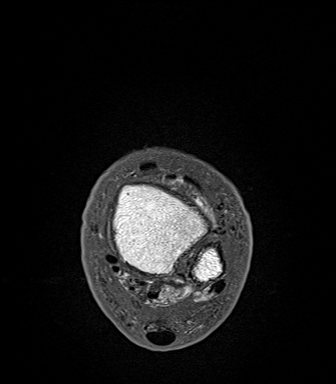

[Series 6: T2 fat-sat · axial · left · 3.0mm · 0.49mm/px · z∈[-63,+73]mm · 4 of 40 slices shown (1 of 2)]
[im 1/40]
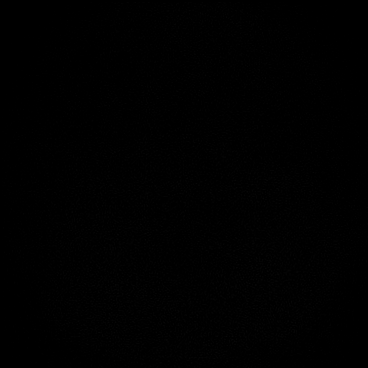
[im 14/40]
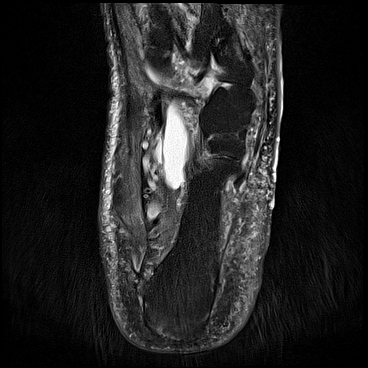
[im 27/40]
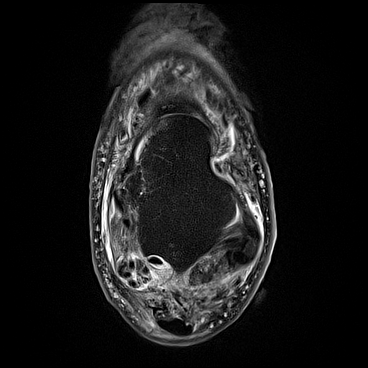
[im 40/40]
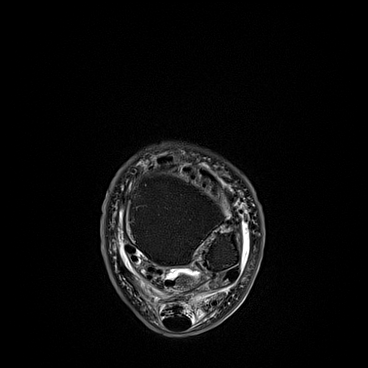

[Series 7: T1 · coronal · left · 3.0mm · 0.52mm/px · 5 of 48 slices shown (2 of 2)]
[im 1/48]
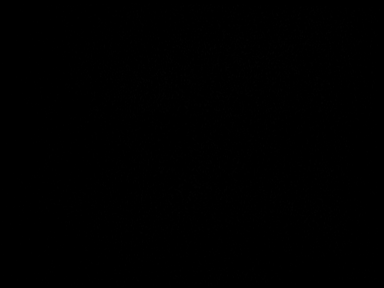
[im 12/48]
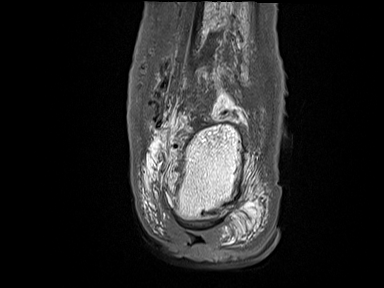
[im 24/48]
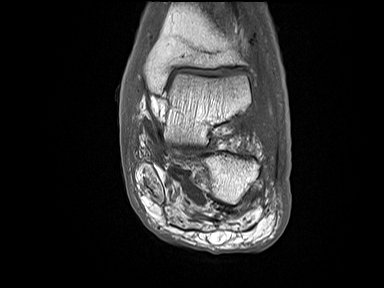
[im 36/48]
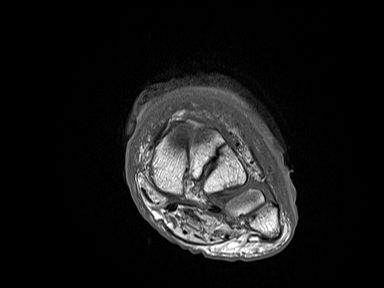
[im 48/48]
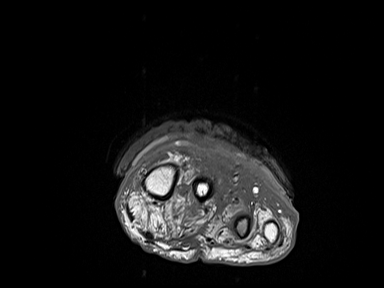

[Series 9: STIR · sagittal · left · 3.0mm · 0.70mm/px · 4 of 33 slices shown]
[im 1/33]
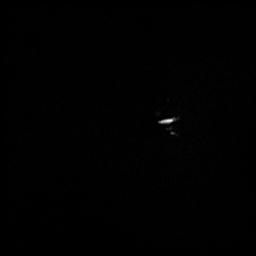
[im 11/33]
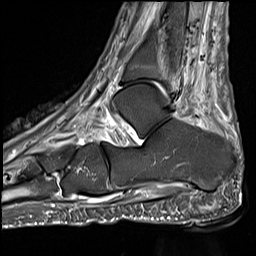
[im 22/33]
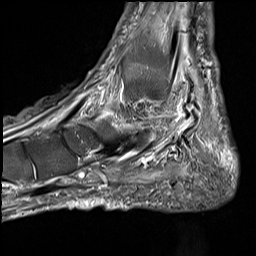
[im 33/33]
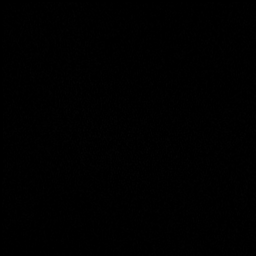

[Series 10: T2 fat-sat · coronal · left · 3.0mm · 0.60mm/px · 5 of 48 slices shown (2 of 2)]
[im 1/48]
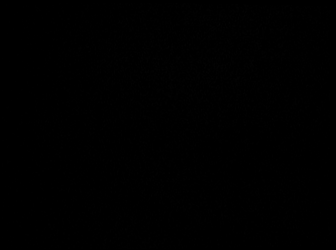
[im 12/48]
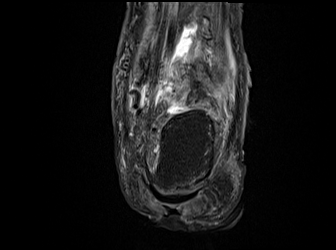
[im 24/48]
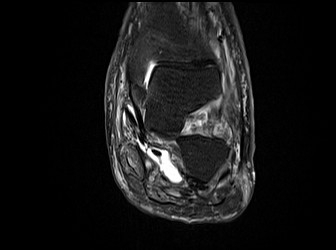
[im 36/48]
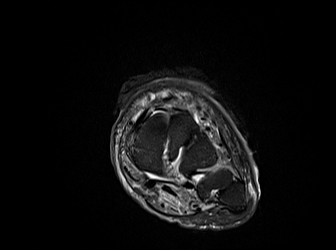
[im 48/48]
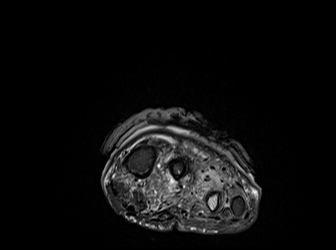

[Series 11: T1 fat-sat · axial · non-contrast · left · 3.0mm · 0.59mm/px · z∈[-63,+73]mm · 4 of 40 slices shown (1 of 3)]
[im 1/40]
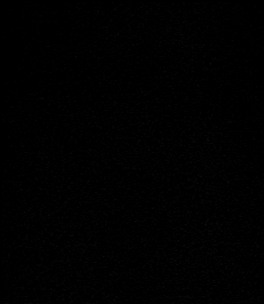
[im 14/40]
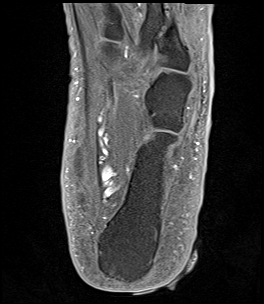
[im 27/40]
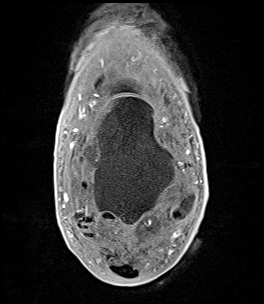
[im 40/40]
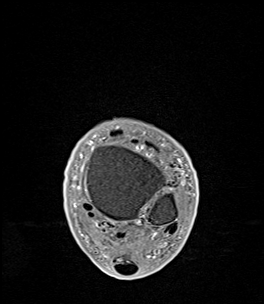

[Series 12: T1 fat-sat post-contrast · axial · left · 3.0mm · 0.59mm/px · z∈[-63,+73]mm · 4 of 40 slices shown]
[im 1/40]
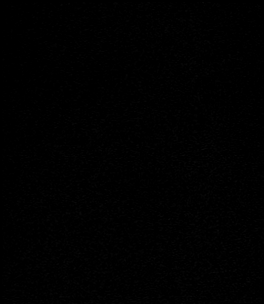
[im 14/40]
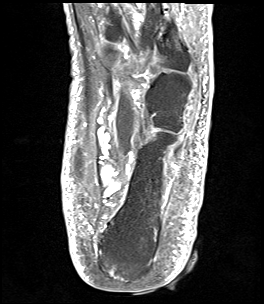
[im 27/40]
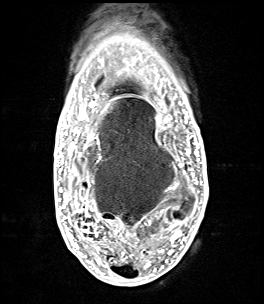
[im 40/40]
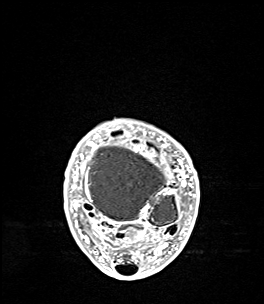

[Series 13: T1 fat-sat · sagittal · left · 3.0mm · 0.59mm/px · 4 of 33 slices shown (2 of 3)]
[im 1/33]
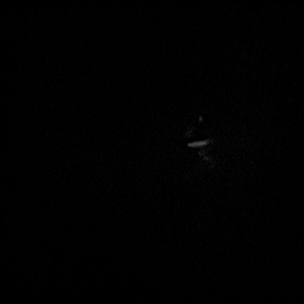
[im 11/33]
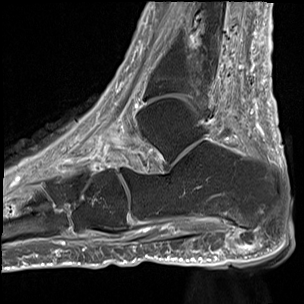
[im 22/33]
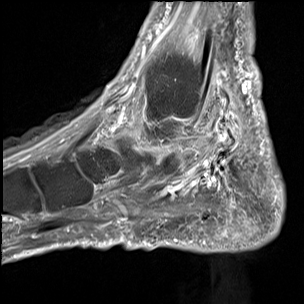
[im 33/33]
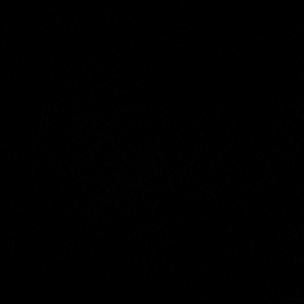

[Series 14: T1 fat-sat · coronal · left · 3.0mm · 0.62mm/px · 5 of 48 slices shown (3 of 3)]
[im 1/48]
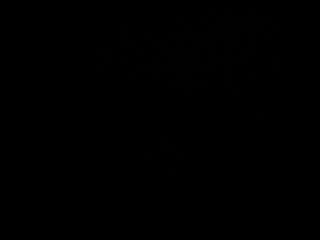
[im 12/48]
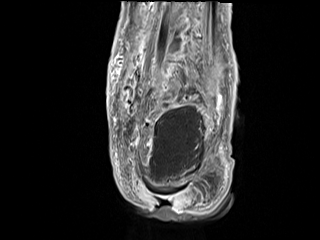
[im 24/48]
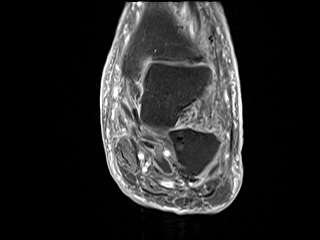
[im 36/48]
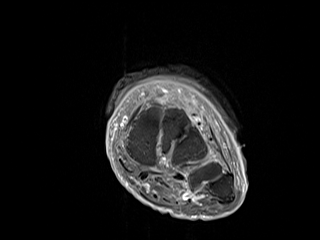
[im 48/48]
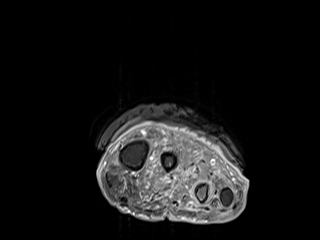

[40 of 40 positions shown; findings below may reference images not displayed]

FINDINGS: Bones/Joint/Cartilage

There is prominent bone marrow edema within the fourth metatarsal
diaphysis (series 6, image 29) without a well-defined fracture line.
The distal aspect of the metatarsals was not included within the
field of view on hindfoot protocol MRI.

Subtle subcortical bone marrow edema and enhancement along the
posterior and plantar surfaces of the calcaneal tuberosity (series
9, image 18) without confluent low T1 signal or discrete bony
erosion.

Status post transmetatarsal amputation of the third ray. Remaining
osseous structures are intact. No malalignment. No significant
arthropathy. No large joint effusion.

Ligaments

Grossly intact ligamentous structures of the ankle and hindfoot.

Muscles and Tendons

Long segment split tear of the peroneus brevis tendon. Posteromedial
ankle tendons are intact. There is moderate tenosynovitis of the
flexor hallucis longus and flexor digitorum longus tendons at the
level of the knot of OTTO. Achilles tendon and anterior ankle
tendons are intact. There is diffuse muscular atrophy and fatty
infiltration suggesting chronic denervation changes. Diffuse
edema-like T2 signal changes throughout the visualized musculature
suggesting a nonspecific myositis.

Soft tissues

Deep soft tissue ulceration at the plantar aspect of the hindfoot
underlying the calcaneal tuberosity near the plantar fascia
attachment (series 9, image 17). There is surrounding edema and
enhancement within the soft tissues suggesting cellulitis. Soft
tissues are diffusely edematous. No organized soft tissue fluid
collection or abscess.
IMPRESSION: 1. Deep soft tissue ulceration at the plantar aspect of the hindfoot
underlying the calcaneal tuberosity near the plantar fascia
attachment with surrounding cellulitis. No organized soft tissue
fluid collection or abscess.
2. Subtle subcortical bone marrow edema and enhancement along the
posterior and plantar surfaces of the calcaneal tuberosity without
confluent low T1 signal or discrete bony erosion. This may represent
reactive osteitis versus early acute osteomyelitis.
3. Prominent bone marrow edema within the fourth metatarsal
diaphysis without a well-defined fracture line. Edema pattern favors
stress reaction. Although felt to be less likely, changes related to
early acute osteomyelitis is difficult to exclude.
4. Moderate tenosynovitis of the flexor hallucis longus and flexor
digitorum longus tendons at the level of the knot of OTTO.
5. Long segment split tear of the peroneus brevis tendon.
6. Muscle signal changes compatible with denervation and/or
myositis.

These results will be called to the ordering clinician or
representative by the Radiologist Assistant, and communication
documented in the PACS or [REDACTED].

## 2019-12-06 MED ORDER — GADOBUTROL 1 MMOL/ML IV SOLN
9.0000 mL | Freq: Once | INTRAVENOUS | Status: AC | PRN
  Administered 2019-12-06: 9 mL via INTRAVENOUS

## 2019-12-08 ENCOUNTER — Other Ambulatory Visit: Payer: Self-pay

## 2019-12-08 ENCOUNTER — Encounter (HOSPITAL_BASED_OUTPATIENT_CLINIC_OR_DEPARTMENT_OTHER): Payer: Medicare Other | Admitting: Internal Medicine

## 2019-12-08 ENCOUNTER — Other Ambulatory Visit (HOSPITAL_BASED_OUTPATIENT_CLINIC_OR_DEPARTMENT_OTHER): Payer: Self-pay | Admitting: Internal Medicine

## 2019-12-08 ENCOUNTER — Other Ambulatory Visit (HOSPITAL_COMMUNITY)
Admission: RE | Admit: 2019-12-08 | Discharge: 2019-12-08 | Disposition: A | Discharge status: Home / Self Care | Payer: Medicare Other | Source: Other Acute Inpatient Hospital | Attending: Internal Medicine | Admitting: Internal Medicine

## 2019-12-08 DIAGNOSIS — E11621 Type 2 diabetes mellitus with foot ulcer: Secondary | ICD-10-CM | POA: Diagnosis not present

## 2019-12-08 DIAGNOSIS — M868X7 Other osteomyelitis, ankle and foot: Secondary | ICD-10-CM | POA: Insufficient documentation

## 2019-12-08 NOTE — Progress Notes (Signed)
Hall, Chad ([id number]) Visit Report for 12/08/2019 Arrival Information Details Patient Name: Date of Service: Chad Hall [place]. 12/08/2019 9:15 A M Medical Record Number: [id number] Patient Account Number: [id number] Date of Birth/Sex: Treating RN: 1950/09/10 (69 y.o. Chad Hall Primary Care Nishika Parkhurst: Kandis Fantasia, Theron Arista Other Clinician: Referring Breeana Sawtelle: Treating Rishith Siddoway/Extender: Linton Rump in Treatment: 2 Visit Information History Since Last Visit Added or deleted any medications: No Patient Arrived: Ambulatory Any new allergies or adverse reactions: No Arrival Time: 09:38 Had a fall or experienced change in No Accompanied By: self activities of daily living that may affect Transfer Assistance: None risk of falls: Patient Identification Verified: Yes Signs or symptoms of abuse/neglect since last visito No Secondary Verification Process Completed: Yes Hospitalized since last visit: No Patient Requires Transmission-Based Precautions: No Implantable device outside of the clinic excluding No Patient Has Alerts: No cellular tissue based products placed in the center since last visit: Has Dressing in Place as Prescribed: Yes Pain Present Now: No Electronic Signature(s) Signed: 12/08/2019 4:07:18 PM By: Karl Ito Entered By: Karl Ito on 12/08/2019 09:39:12 -------------------------------------------------------------------------------- Encounter Discharge Information Details Patient Name: Date of Service: Chad Hall, Barkley Boards MA S W. 12/08/2019 9:15 A M Medical Record Number: [id number] Patient Account Number: [id number] Date of Birth/Sex: Treating RN: May 22, 1950 (69 y.o. Chad Hall Primary Care Haile Bosler: Alanda Amass Other Clinician: Referring Maleena Eddleman: Treating Eden Rho/Extender: Linton Rump in Treatment: 2 Encounter Discharge Information Items Post Procedure Vitals Discharge Condition:  Stable Temperature (F): 98.1 Ambulatory Status: Cane Pulse (bpm): 90 Discharge Destination: Home Respiratory Rate (breaths/min): 18 Transportation: Other Blood Pressure (mmHg): 167/81 Accompanied By: self Schedule Follow-up Appointment: Yes Clinical Summary of Care: Patient Declined Electronic Signature(s) Signed: 12/08/2019 5:00:52 PM By: Cherylin Mylar Entered By: Cherylin Mylar on 12/08/2019 11:06:54 -------------------------------------------------------------------------------- Lower Extremity Assessment Details Patient Name: Date of Service: Chad Scott MA S W. 12/08/2019 9:15 A M Medical Record Number: [id number] Patient Account Number: [id number] Date of Birth/Sex: Treating RN: 01/06/1951 (69 y.o. Chad Hall Primary Care Abed Schar: Alanda Amass Other Clinician: Referring Praveen Coia: Treating Taiwo Fish/Extender: Mikey College NK, PETER Weeks in Treatment: 2 Edema Assessment Assessed: [Left: No] [Hall: No] Edema: [Left: Ye] [Hall: s] Calf Left: Hall: Point of Measurement: 47 cm From Medial Instep 29.5 cm Ankle Left: Hall: Point of Measurement: 12 cm From Medial Instep 21.5 cm Vascular Assessment Pulses: Dorsalis Pedis Palpable: [Left:No] Electronic Signature(s) Signed: 12/08/2019 5:00:52 PM By: Cherylin Mylar Entered By: Cherylin Mylar on 12/08/2019 10:05:43 -------------------------------------------------------------------------------- Multi Wound Chart Details Patient Name: Date of Service: Chad Hall, Barkley Boards MA S W. 12/08/2019 9:15 A M Medical Record Number: [id number] Patient Account Number: [id number] Date of Birth/Sex: Treating RN: 1950-09-30 (69 y.o. Chad Hall Primary Care Emrick Hensch: Kandis Fantasia, PETER Other Clinician: Referring Gurfateh Mcclain: Treating Marialena Wollen/Extender: Mikey College NK, PETER Weeks in Treatment: 2 Vital Signs Height(in): 77 Pulse(bpm): 90 Weight(lbs): 210 Blood Pressure(mmHg): 167/81 Body  Mass Index(BMI): 25 Temperature(F): 98.1 Respiratory Rate(breaths/min): 18 Photos: [1:No Photos Left, Dorsal Foot] [2:No Photos Left Amputation Site - Toe] [3:No Photos Left T Fourth oe] Wound Location: [1:Gradually Appeared] [2:Gradually Appeared] [3:Gradually Appeared] Wounding Event: [1:Diabetic Wound/Ulcer of the Lower] [2:Diabetic Wound/Ulcer of the Lower] [3:Diabetic Wound/Ulcer of the Lower] Primary Etiology: [1:Extremity Hypertension, Type II Diabetes,] [2:Extremity Hypertension, Type II Diabetes,] [3:Extremity Hypertension, Type II Diabetes,] Comorbid History: [1:Neuropathy 08/17/2019] [2:Neuropathy 01/17/2019] [3:Neuropathy 09/17/2019] Date Acquired: [1:2] [2:2] [3:2] Weeks of Treatment: [1:Open] [2:Open] [3:Open] Wound Status: [1:10x9x0.1] [  2:3x1x0.4] [3:0.5x1x0.3] Measurements L x W x D (cm) [1:70.686] [2:2.356] [3:0.393] A (cm) : rea [1:7.069] [2:0.942] [3:0.118] Volume (cm) : [1:9.10%] [2:83.30%] [3:91.70%] % Reduction in A rea: [1:9.10%] [2:33.40%] [3:74.90%] % Reduction in Volume: [1:Grade 3] [2:Grade 3] [3:Grade 3] Classification: [1:MRI] [2:MRI] [3:MRI] Wagner Verification: [1:Large] [2:Medium] [3:Medium] Exudate A mount: [1:Serosanguineous] [2:Serous] [3:Serous] Exudate Type: [1:red, brown] [2:amber] [3:amber] Exudate Color: [1:Distinct, outline attached] [2:Distinct, outline attached] [3:Distinct, outline attached] Wound Margin: [1:Medium (34-66%)] [2:Medium (34-66%)] [3:Medium (34-66%)] Granulation A mount: [1:Red, Pink] [2:Red, Pink] [3:Red, Pink] Granulation Quality: [1:Medium (34-66%)] [2:Medium (34-66%)] [3:Medium (34-66%)] Necrotic A mount: [1:Fat Layer (Subcutaneous Tissue): Yes Fat Layer (Subcutaneous Tissue): Yes Fat Layer (Subcutaneous Tissue): Yes] Exposed Structures: [1:Fascia: No Tendon: No Muscle: No Joint: No Bone: No Small (1-33%)] [2:Fascia: No Tendon: No Muscle: No Joint: No Bone: No Small (1-33%)] [3:Fascia: No Tendon: No Muscle: No Joint: No Bone: No  Small (1-33%)] Epithelialization: [1:N/A] [2:N/A] [3:N/A] Debridement: [1:N/A] [2:N/A] [3:N/A] Pain Control: [1:N/A] [2:N/A] [3:N/A] Tissue Debrided: [1:N/A] [2:N/A] [3:N/A] Level: [1:N/A] [2:N/A] [3:N/A] Debridement A (sq cm): [1:rea N/A] [2:N/A] [3:N/A] Instrument: [1:N/A] [2:N/A] [3:N/A] Bleeding: [1:N/A] [2:N/A] [3:N/A] Hemostasis A chieved: [1:N/A] [2:N/A] [3:N/A] Procedural Pain: [1:N/A] [2:N/A] [3:N/A] Post Procedural Pain: Debridement Treatment Response: N/A [2:N/A] [3:N/A] Post Debridement Measurements L x N/A [2:N/A] [3:N/A] W x D (cm) [1:N/A] [2:N/A] [3:N/A] Post Debridement Volume: (cm) [1:N/A] [2:N/A] [3:N/A] Wound Number: 4 5 N/A Photos: No Photos No Photos N/A Left Metatarsal head fourth Left Calcaneus N/A Wound Location: Gradually Appeared Gradually Appeared N/A Wounding Event: Diabetic Wound/Ulcer of the Lower Diabetic Wound/Ulcer of the Lower N/A Primary Etiology: Extremity Extremity Hypertension, Type II Diabetes, Hypertension, Type II Diabetes, N/A Comorbid History: Neuropathy Neuropathy 08/17/2019 09/17/2019 N/A Date Acquired: 2 2 N/A Weeks of Treatment: Open Open N/A Wound Status: 1.5x1.6x1 1.5x1.9x1.7 N/A Measurements L x W x D (cm) 1.885 2.238 N/A A (cm) : rea 1.885 3.805 N/A Volume (cm) : -33.30% 24.00% N/A % Reduction in A rea: -1236.90% -84.50% N/A % Reduction in Volume: Grade 3 Grade 3 N/A Classification: MRI MRI N/A [address] Verification: Large Large N/A Exudate A mount: Serosanguineous Serosanguineous N/A Exudate Type: red, brown red, brown N/A Exudate Color: Distinct, outline attached Distinct, outline attached N/A Wound Margin: Medium (34-66%) Medium (34-66%) N/A Granulation A mount: Red, Pink Red, Pink N/A Granulation Quality: Medium (34-66%) Medium (34-66%) N/A Necrotic A mount: Fat Layer (Subcutaneous Tissue): Yes Fat Layer (Subcutaneous Tissue): Yes N/A Exposed Structures: Fascia: No Fascia: No Tendon: No Tendon:  No Muscle: No Muscle: No Joint: No Joint: No Bone: No Bone: No Small (1-33%) Small (1-33%) N/A Epithelialization: Debridement - Excisional Debridement - Excisional N/A Debridement: Pre-procedure Verification/Time Out 10:40 10:40 N/A Taken: Other Other N/A Pain Control: Subcutaneous, Slough Bone, Subcutaneous N/A Tissue Debrided: Skin/Subcutaneous Tissue Skin/Subcutaneous N/A Level: Tissue/Muscle/Bone 2.4 0.25 N/A Debridement A (sq cm): rea Blade Rongeur N/A Instrument: Minimum Minimum N/A Bleeding: Silver Nitrate Silver Nitrate N/A Hemostasis Achieved: 0 0 N/A Procedural Pain: 0 0 N/A Post Procedural Pain: Procedure was tolerated well Procedure was tolerated well N/A Debridement Treatment Response: 1.5x1.6x1 1.5x1.9x1.7 N/A Post Debridement Measurements L x W x D (cm) 1.885 3.805 N/A Post Debridement Volume: (cm) Debridement Debridement N/A Procedures Performed: Treatment Notes Electronic Signature(s) Signed: 12/08/2019 4:52:36 PM By: Baltazar Najjar MD Signed: 12/08/2019 5:21:31 PM By: Zenaida Deed RN, BSN Entered By: Baltazar Najjar on 12/08/2019 10:52:58 -------------------------------------------------------------------------------- Multi-Disciplinary Care Plan Details Patient Name: Date of Service: Chad Hall, Barkley Boards MA S W. 12/08/2019 9:15 A M Medical Record  Number: [id number] Patient Account Number: [id number] Date of Birth/Sex: Treating RN: [birth year] (69 y.o. Chad Hall Primary Care Kamica Florance: Kandis Fantasia, Theron Arista Other Clinician: Referring Xiomara Sevillano: Treating Leiam Hopwood/Extender: Linton Rump in Treatment: 2 Active Inactive Nutrition Nursing Diagnoses: Impaired glucose control: actual or potential Goals: Patient/caregiver verbalizes understanding of need to maintain therapeutic glucose control per primary care physician Date Initiated: 11/24/2019 Target Resolution Date: 12/22/2019 Goal Status:  Active Interventions: Provide education on elevated blood sugars and impact on wound healing Notes: Soft Tissue Infection Nursing Diagnoses: Impaired tissue integrity Knowledge deficit related to home infection control: handwashing, handling of soiled dressings, supply storage Potential for infection: soft tissue Goals: Patient's soft tissue infection will resolve Date Initiated: 12/08/2019 Target Resolution Date: 01/05/2020 Goal Status: Active Interventions: Assess signs and symptoms of infection every visit Provide education on infection Screen for HBO Treatment Activities: Consult for HBO : 12/08/2019 Culture and sensitivity : 12/08/2019 Systemic antibiotics : 12/08/2019 Notes: Wound/Skin Impairment Nursing Diagnoses: Knowledge deficit related to ulceration/compromised skin integrity Goals: Ulcer/skin breakdown will have a volume reduction of 50% by week 8 Date Initiated: 11/24/2019 Target Resolution Date: 12/22/2019 Goal Status: Active Interventions: Provide education on ulcer and skin care Notes: Electronic Signature(s) Signed: 12/08/2019 5:21:31 PM By: Zenaida Deed RN, BSN Entered By: Zenaida Deed on 12/08/2019 10:26:46 -------------------------------------------------------------------------------- Pain Assessment Details Patient Name: Date of Service: Chad Scott MA S W. 12/08/2019 9:15 A M Medical Record Number: [id number] Patient Account Number: [id number] Date of Birth/Sex: Treating RN: [birth year] (69 y.o. Chad Hall Primary Care Gianno Volner: Kandis Fantasia, Theron Arista Other Clinician: Referring Maclain Cohron: Treating Malaiya Paczkowski/Extender: Janifer Adie Weeks in Treatment: 2 Active Problems Location of Pain Severity and Description of Pain Patient Has Paino No Site Locations Pain Management and Medication Current Pain Management: Electronic Signature(s) Signed: 12/08/2019 4:07:18 PM By: Karl Ito Signed: 12/08/2019 5:21:31 PM By:  Zenaida Deed RN, BSN Entered By: Karl Ito on 12/08/2019 09:39:30 -------------------------------------------------------------------------------- Patient/Caregiver Education Details Patient Name: Date of Service: Verlon Setting 10/22/2021andnbsp9:15 A M Medical Record Number: [id number] Patient Account Number: [id number] Date of Birth/Gender: Treating RN: Dec 14, 1950 (69 y.o. Chad Hall Primary Care Physician: Kandis Fantasia, Theron Arista Other Clinician: Referring Physician: Treating Physician/Extender: Linton Rump in Treatment: 2 Education Assessment Education Provided To: Patient Education Topics Provided Elevated Blood Sugar/ Impact on Healing: Methods: Explain/Verbal Responses: Reinforcements needed, State content correctly Infection: Methods: Explain/Verbal Responses: Reinforcements needed, State content correctly Wound/Skin Impairment: Methods: Explain/Verbal Responses: Reinforcements needed, State content correctly Electronic Signature(s) Signed: 12/08/2019 5:21:31 PM By: Zenaida Deed RN, BSN Entered By: Zenaida Deed on 12/08/2019 10:27:33 -------------------------------------------------------------------------------- Wound Assessment Details Patient Name: Date of Service: Chad Scott MA S W. 12/08/2019 9:15 A M Medical Record Number: [id number] Patient Account Number: [id number] Date of Birth/Sex: Treating RN: [birth year] (69 y.o. Chad Hall Primary Care Dynastie Knoop: Kandis Fantasia, PETER Other Clinician: Referring Wade Asebedo: Treating Crista Nuon/Extender: Mikey College NK, PETER Weeks in Treatment: 2 Wound Status Wound Number: 1 Primary Etiology: Diabetic Wound/Ulcer of the Lower Extremity Wound Location: Left, Dorsal Foot Wound Status: Open Wounding Event: Gradually Appeared Comorbid History: Hypertension, Type II Diabetes, Neuropathy Date Acquired: 08/17/2019 Weeks Of Treatment: 2 Clustered Wound: No Wound  Measurements Length: (cm) 10 Width: (cm) 9 Depth: (cm) 0.1 Area: (cm) 70.686 Volume: (cm) 7.069 % Reduction in Area: 9.1% % Reduction in Volume: 9.1% Epithelialization: Small (1-33%) Wound Description Classification: Grade 3 Wagner Verification: MRI Wound Margin: Distinct, outline attached Exudate Amount: Large  Exudate Type: Serosanguineous Exudate Color: red, brown Foul Odor After Cleansing: No Slough/Fibrino Yes Wound Bed Granulation Amount: Medium (34-66%) Exposed Structure Granulation Quality: Red, Pink Fascia Exposed: No Necrotic Amount: Medium (34-66%) Fat Layer (Subcutaneous Tissue) Exposed: Yes Necrotic Quality: Adherent Slough Tendon Exposed: No Muscle Exposed: No Joint Exposed: No Bone Exposed: No Treatment Notes Wound #1 (Left, Dorsal Foot) 1. Cleanse With Wound Cleanser Soap and water 2. Periwound Care Barrier cream 3. Primary Dressing Applied Hydrofera Blue 4. Secondary Dressing ABD Pad Dry Gauze Heel Cup 6. Support Layer Applied 3 layer compression wrap Notes netting and stockinette Electronic Signature(s) Signed: 12/08/2019 5:00:52 PM By: Cherylin Mylar Signed: 12/08/2019 5:21:31 PM By: Zenaida Deed RN, BSN Entered By: Cherylin Mylar on 12/08/2019 10:06:04 -------------------------------------------------------------------------------- Wound Assessment Details Patient Name: Date of Service: Chad Scott MA S W. 12/08/2019 9:15 A M Medical Record Number: [id number] Patient Account Number: [id number] Date of Birth/Sex: Treating RN: Sep 20, 1950 (69 y.o. 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 Location: Left Amputation Site - Toe Wound Status: Open Wounding Event: Gradually Appeared Comorbid History:  Hypertension, Type II Diabetes, Neuropathy Date Acquired: 01/17/2019 Weeks Of Treatment: 2 Clustered Wound: No Wound Measurements Length: (cm) 3 Width: (cm) 1 Depth: (cm) 0.4 Area: (cm) 2.356 Volume: (cm) 0.942 % Reduction in Area: 83.3% % Reduction in Volume: 33.4% Epithelialization: Small (1-33%) Tunneling: No Undermining: No Wound Description Classification: Grade 3 Wagner Verification: MRI Wound Margin: Distinct, outline attached Exudate Amount: Medium Exudate Type: Serous Exudate Color: amber Foul Odor After Cleansing: No Slough/Fibrino Yes Wound Bed Granulation Amount: Medium (34-66%) Exposed Structure Granulation Quality: Red, Pink Fascia Exposed: No Necrotic Amount: Medium (34-66%) Fat Layer (Subcutaneous Tissue) Exposed: Yes Necrotic Quality: Adherent Slough Tendon Exposed: No Muscle Exposed: No Joint Exposed: No Bone Exposed: No Treatment Notes Wound #2 (Left Amputation Site - Toe) 1. Cleanse With Wound Cleanser Soap and water 2. Periwound Care Barrier cream 3. Primary Dressing Applied Hydrofera Blue 4. Secondary Dressing ABD Pad Dry Gauze Heel Cup 6. Support Layer Applied 3 layer compression wrap Notes netting and stockinette Electronic Signature(s) Signed: 12/08/2019 5:00:52 PM By: Cherylin Mylar Signed: 12/08/2019 5:21:31 PM By: Zenaida Deed RN, BSN Entered By: Cherylin Mylar on 12/08/2019 10:06:38 -------------------------------------------------------------------------------- Wound Assessment Details Patient Name: Date of Service: Chad Scott MA S W. 12/08/2019 9:15 A M Medical Record Number: [id number] Patient Account Number: [id number] Date of Birth/Sex: Treating RN: [birth year] (69 y.o. Chad Hall Primary Care Kamela Blansett: Kandis Fantasia, PETER Other Clinician: Referring Timiko Offutt: Treating Jarret Torre/Extender: Mikey College NK, PETER Weeks in Treatment: 2 Wound Status Wound Number: 3 Primary Etiology: Diabetic  Wound/Ulcer of the Lower Extremity Wound Location: Left T Fourth oe Wound Status: Open Wounding Event: Gradually Appeared Comorbid History: Hypertension, Type II Diabetes, Neuropathy Date Acquired: 09/17/2019 Weeks Of Treatment: 2 Clustered Wound: No Wound Measurements Length: (cm) 0.5 Width: (cm) 1 Depth: (cm) 0.3 Area: (cm) 0.393 Volume: (cm) 0.118 % Reduction in Area: 91.7% % Reduction in Volume: 74.9% Epithelialization: Small (1-33%) Tunneling: No Undermining: No Wound Description Classification: Grade 3 Wagner Verification: MRI Wound Margin: Distinct, outline attached Exudate Amount: Medium Exudate Type: Serous Exudate Color: amber Foul Odor After Cleansing: No Slough/Fibrino Yes Wound Bed Granulation Amount: Medium (34-66%) Exposed Structure Granulation Quality: Red, Pink Fascia Exposed: No Necrotic Amount: Medium (34-66%) Fat Layer (Subcutaneous Tissue) Exposed: Yes  Necrotic Quality: Adherent Slough Tendon Exposed: No Muscle Exposed: No Joint Exposed: No Bone Exposed: No Treatment Notes Wound #3 (Left Toe Fourth) 1. Cleanse With Wound Cleanser Soap and water 2. Periwound Care Barrier cream 3. Primary Dressing Applied Hydrofera Blue 4. Secondary Dressing ABD Pad Dry Gauze Heel Cup 6. Support Layer Applied 3 layer compression wrap Notes netting and stockinette Electronic Signature(s) Signed: 12/08/2019 5:00:52 PM By: Cherylin Mylarwiggins, Shannon Signed: 12/08/2019 5:21:31 PM By: Zenaida DeedBoehlein, Linda RN, BSN Entered By: Cherylin Mylarwiggins, Shannon on 12/08/2019 10:07:07 -------------------------------------------------------------------------------- Wound Assessment Details Patient Name: Date of Service: Chad ScottWILLIA Hall, THO MA S W. 12/08/2019 9:15 A M Medical Record Number: [id number] Patient Account Number: [id number] Date of Birth/Sex: Treating RN: April 23, 1950 (69 y.o. Chad SchoonerM) Boehlein, Linda Primary Care Cathyrn Deas: Kandis FantasiaFRA NK, PETER Other Clinician: Referring Mohab Ashby: Treating  Olisa Quesnel/Extender: Mikey Collegeobson, Michael FRA NK, PETER Weeks in Treatment: 2 Wound Status Wound Number: 4 Primary Etiology: Diabetic Wound/Ulcer of the Lower Extremity Wound Location: Left Metatarsal head fourth Wound Status: Open Wounding Event: Gradually Appeared Comorbid History: Hypertension, Type II Diabetes, Neuropathy Date Acquired: 08/17/2019 Weeks Of Treatment: 2 Clustered Wound: No Wound Measurements Length: (cm) 1.5 Width: (cm) 1.6 Depth: (cm) 1 Area: (cm) 1.885 Volume: (cm) 1.885 % Reduction in Area: -33.3% % Reduction in Volume: -1236.9% Epithelialization: Small (1-33%) Tunneling: No Undermining: No Wound Description Classification: Grade 3 Wagner Verification: MRI Wound Margin: Distinct, outline attached Exudate Amount: Large Exudate Type: Serosanguineous Exudate Color: red, brown Wound Bed Granulation Amount: Medium (34-66%) Granulation Quality: Red, Pink Necrotic Amount: Medium (34-66%) Necrotic Quality: Adherent Slough Foul Odor After Cleansing: No Slough/Fibrino Yes Exposed Structure Fascia Exposed: No Fat Layer (Subcutaneous Tissue) Exposed: Yes Tendon Exposed: No Muscle Exposed: No Joint Exposed: No Bone Exposed: No Treatment Notes Wound #4 (Left Metatarsal head fourth) 1. Cleanse With Wound Cleanser Soap and water 2. Periwound Care Barrier cream 3. Primary Dressing Applied Hydrofera Blue 4. Secondary Dressing ABD Pad Dry Gauze Heel Cup 6. Support Layer Applied 3 layer compression wrap Notes netting and stockinette Electronic Signature(s) Signed: 12/08/2019 5:00:52 PM By: Cherylin Mylarwiggins, Shannon Signed: 12/08/2019 5:21:31 PM By: Zenaida DeedBoehlein, Linda RN, BSN Entered By: Cherylin Mylarwiggins, Shannon on 12/08/2019 10:07:22 -------------------------------------------------------------------------------- Wound Assessment Details Patient Name: Date of Service: Chad ScottWILLIA Hall, THO MA S W. 12/08/2019 9:15 A M Medical Record Number: [id number] Patient Account Number:  [id number] Date of Birth/Sex: Treating RN: April 23, 1950 (69 y.o. Chad SchoonerM) Boehlein, Linda Primary Care Brandyn Thien: Kandis FantasiaFRA NK, PETER Other Clinician: Referring Zineb Glade: Treating Syriah Delisi/Extender: Mikey Collegeobson, Michael FRA NK, PETER Weeks in Treatment: 2 Wound Status Wound Number: 5 Primary Etiology: Diabetic Wound/Ulcer of the Lower Extremity Wound Location: Left Calcaneus Wound Status: Open Wounding Event: Gradually Appeared Comorbid History: Hypertension, Type II Diabetes, Neuropathy Date Acquired: 09/17/2019 Weeks Of Treatment: 2 Clustered Wound: No Wound Measurements Length: (cm) 1.5 Width: (cm) 1.9 Depth: (cm) 1.7 Area: (cm) 2.238 Volume: (cm) 3.805 % Reduction in Area: 24% % Reduction in Volume: -84.5% Epithelialization: Small (1-33%) Tunneling: No Undermining: No Wound Description Classification: Grade 3 Wagner Verification: MRI Wound Margin: Distinct, outline attached Exudate Amount: Large Exudate Type: Serosanguineous Exudate Color: red, brown Foul Odor After Cleansing: No Slough/Fibrino Yes Wound Bed Granulation Amount: Medium (34-66%) Exposed Structure Granulation Quality: Red, Pink Fascia Exposed: No Necrotic Amount: Medium (34-66%) Fat Layer (Subcutaneous Tissue) Exposed: Yes Necrotic Quality: Adherent Slough Tendon Exposed: No Muscle Exposed: No Joint Exposed: No Bone Exposed: No Treatment Notes Wound #5 (Left Calcaneus) 1. Cleanse With Wound Cleanser Soap and water 2. Periwound Care Barrier cream 3. Primary Dressing Applied Hydrofera Blue  4. Secondary Dressing ABD Pad Dry Gauze Heel Cup 6. Support Layer Applied 3 layer compression wrap Notes netting and stockinette Electronic Signature(s) Signed: 12/08/2019 5:00:52 PM By: Cherylin Mylar Signed: 12/08/2019 5:21:31 PM By: Zenaida Deed RN, BSN Entered By: Cherylin Mylar on 12/08/2019 10:07:37 -------------------------------------------------------------------------------- Vitals  Details Patient Name: Date of Service: Chad Hall, Barkley Boards MA S W. 12/08/2019 9:15 A M Medical Record Number: [id number] Patient Account Number: [id number] Date of Birth/Sex: Treating RN: 1950-07-15 (69 y.o. Chad Hall Primary Care Arleigh Odowd: Kandis Fantasia, PETER Other Clinician: Referring Lorissa Kishbaugh: Treating Darica Goren/Extender: Mikey College NK, PETER Weeks in Treatment: 2 Vital Signs Time Taken: 09:39 Temperature (F): 98.1 Height (in): 77 Pulse (bpm): 90 Weight (lbs): 210 Respiratory Rate (breaths/min): 18 Body Mass Index (BMI): 24.9 Blood Pressure (mmHg): 167/81 Reference Range: 80 - 120 mg / dl Electronic Signature(s) Signed: 12/08/2019 4:07:18 PM By: Karl Ito Entered By: Karl Ito on 12/08/2019 09:39:26

## 2019-12-08 NOTE — Progress Notes (Signed)
Chad, Hall ([id number]) Visit Report for 12/08/2019 Debridement Details Patient Name: Date of Service: Chad Hall [place]. 12/08/2019 9:15 A M Medical Record Number: [id number] Patient Account Number: [id number] Date of Birth/Sex: Treating RN: 19-Jul-1950 (69 y.o. Chad Hall Primary Care Provider: Myrle Sheng, Collier Salina Other Clinician: Referring Provider: Treating Provider/Extender: Francella Solian Weeks in Treatment: 2 Debridement Performed for Assessment: Wound #4 Left Metatarsal head fourth Performed By: Physician Ricard Dillon., MD Debridement Type: Debridement Severity of Tissue Pre Debridement: Fat layer exposed Level of Consciousness (Pre-procedure): Awake and Alert Pre-procedure Verification/Time Out Yes - 10:40 Taken: Start Time: 10:40 Pain Control: Other : benzocaine 20% spray T Area Debrided (L x Hall): otal 1.5 (cm) x 1.6 (cm) = 2.4 (cm) Tissue and other material debrided: Viable, Non-Viable, Slough, Subcutaneous, Slough Level: Skin/Subcutaneous Tissue Debridement Description: Excisional Instrument: Blade Bleeding: Minimum Hemostasis Achieved: Silver Nitrate End Time: 10:47 Procedural Pain: 0 Post Procedural Pain: 0 Response to Treatment: Procedure was tolerated well Level of Consciousness (Post- Awake and Alert procedure): Post Debridement Measurements of Total Wound Length: (cm) 1.5 Width: (cm) 1.6 Depth: (cm) 1 Volume: (cm) 1.885 Character of Wound/Ulcer Post Debridement: Improved Severity of Tissue Post Debridement: Fat layer exposed Post Procedure Diagnosis Same as Pre-procedure Electronic Signature(s) Signed: 12/08/2019 4:52:36 PM By: Linton Ham MD Signed: 12/08/2019 5:21:31 PM By: Baruch Gouty RN, BSN Entered By: Linton Ham on 12/08/2019 10:53:09 -------------------------------------------------------------------------------- Debridement Details Patient Name: Date of Service: Chad Hall, Chad Hall.  12/08/2019 9:15 A M Medical Record Number: [id number] Patient Account Number: [id number] Date of Birth/Sex: Treating RN: September 16, 1950 (69 y.o. Chad Hall Primary Care Provider: Myrle Sheng, Collier Salina Other Clinician: Referring Provider: Treating Provider/Extender: Francella Solian Weeks in Treatment: 2 Debridement Performed for Assessment: Wound #5 Left Calcaneus Performed By: Physician Ricard Dillon., MD Debridement Type: Debridement Severity of Tissue Pre Debridement: Necrosis of bone Level of Consciousness (Pre-procedure): Awake and Alert Pre-procedure Verification/Time Out Yes - 10:40 Taken: Start Time: 10:40 Pain Control: Other : benzocaine 20% spray T Area Debrided (L x Hall): otal 0.5 (cm) x 0.5 (cm) = 0.25 (cm) Tissue and other material debrided: Viable, Non-Viable, Bone, Subcutaneous Level: Skin/Subcutaneous Tissue/Muscle/Bone Debridement Description: Excisional Instrument: Rongeur Bleeding: Minimum Hemostasis Achieved: Silver Nitrate End Time: 10:47 Procedural Pain: 0 Post Procedural Pain: 0 Response to Treatment: Procedure was tolerated well Level of Consciousness (Post- Awake and Alert procedure): Post Debridement Measurements of Total Wound Length: (cm) 1.5 Width: (cm) 1.9 Depth: (cm) 1.7 Volume: (cm) 3.805 Character of Wound/Ulcer Post Debridement: Improved Severity of Tissue Post Debridement: Necrosis of bone Post Procedure Diagnosis Same as Pre-procedure Electronic Signature(s) Signed: 12/08/2019 4:52:36 PM By: Linton Ham MD Signed: 12/08/2019 5:21:31 PM By: Baruch Gouty RN, BSN Entered By: Linton Ham on 12/08/2019 10:53:18 -------------------------------------------------------------------------------- HPI Details Patient Name: Date of Service: Chad Hall, Chad Hall. 12/08/2019 9:15 A M Medical Record Number: [id number] Patient Account Number: [id number] Date of Birth/Sex: Treating RN: Sep 06, 1950 (69 y.o. Chad Hall Primary Care Provider: Myrle Sheng, Collier Salina Other Clinician: Referring Provider: Treating Provider/Extender: Francella Solian Weeks in Treatment: 2 History of Present Illness HPI Description: ADMISSION 11/24/2019 Chad Hall is a 69 year old man with type 2 diabetes well controlled peripheral neuropathy and blindness secondary to remote bilateral retinal detachments I believe. His problem with regards to his left foot began it in November 2020. He developed osteomyelitis of his left toe/MTP and underwent a left third ray amputation by  Dr. Sharol Given. He developed a wound dehiscence by early December and he was followed for this by Dr. Jess Barters office for most of this year. Most recently he has been using silver alginate. Trental and nitroglycerin patches have also been used. Early in September it was determined that the third grade dehiscence had closed over but for the first time Hall of a wound on the fourth toe, fourth met head and left plantar heel. Dr. Sharol Given had talked about a transmetatarsal amputation but that certainly would not address the very significant area on his heel now and also there was some conversation about a BKA. The patient is here to see what can be done to save his foot The patient is a well-controlled type II diabetic on oral agents. He also has severe chronic venous insufficiency, Marfan's, hyperlipidemia, thoracic aortic aneurysm, blindness. 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 He was on his feet for quite a period of time but he tells me he is not working currently. His ABI in our clinic was 1.03 on the left he was not felt in Dr. Jess Barters office based on bedside Dopplers to have arterial insufficiency. This seems quite correct 10/15 this is a patient who has 3 open wounds at surgical site wound on a third ray amputation site, and area over the fourth met head and a deep area over the plantar left heel. His MRI is booked for  10/20. Based on the MRI we will need to make a decision whether we attempt surgical or medical treatment for underlying infection if this is present versus offloading with a total contact cast. Even the total contact cast might have obstacles given his visual issues/balance. He lives with his wife who is also visually impaired. He takes Scat into the clinic. He also has severe chronic venous insufficiency which I think is added to the difficulties in healing of the surgical wound largely in the dorsal foot just proximal to the third toe amputation. We have been using silver alginate. I changed him to [facility] today 10/22; patient's MRI is come back suggesting subtle subcortical bone marrow edema and enhancement along the posterior and plantar surfaces of the calcaneal tuberosity either reactive osteitis or acute early osteomyelitis. The third ray amputation site did not have any obvious pathologic findings to suggest infection there was no joint effusion.. He has wound over the fourth metatarsal head there was no comment about this either. Given the fact that the patient has a probing wound on his heel to bone I suspect he does have osteomyelitis. I am going to send him to infectious disease for antibiotic management probably IV antibiotics. I have done a bone culture and pathology today from the heel. He came in today with his leg wrapped apparently there was quite an odor we will try to get home health to change this at least once Electronic Signature(s) Signed: 12/08/2019 4:52:36 PM By: Linton Ham MD Entered By: Linton Ham on 12/08/2019 10:57:06 -------------------------------------------------------------------------------- Physical Exam Details Patient Name: Date of Service: Chad Pear MA S Hall. 12/08/2019 9:15 A M Medical Record Number: [id number] Patient Account Number: [id number] Date of Birth/Sex: Treating RN: [birth year] (69 y.o. Chad Hall Primary Care Provider:  Myrle Sheng, Collier Salina Other Clinician: Referring Provider: Treating Provider/Extender: Burnis Kingfisher NK, PETER Weeks in Treatment: 2 Constitutional Patient is hypertensive.. Pulse regular and within target range for patient.Marland Kitchen Respirations regular, non-labored and within target range.. Temperature is normal and within the target  range for the patient.Marland Kitchen Appears in no distress. Notes Wound exam The third ray amputation site actually looks completely closed. I think the issue here was too much edema probably lymphedema. He has an area over his left fourth metatarsal head. Considerably hyper granulated with divots around the wound. I remove the hyper granulation with a #15 scalpel hemostasis with silver nitrate Deep plantar wound on the heel. This goes straight to bone. Using rongeurs I obtain specimens for culture and pathology Electronic Signature(s) Signed: 12/08/2019 4:52:36 PM By: Linton Ham MD Entered By: Linton Ham on 12/08/2019 11:01:56 -------------------------------------------------------------------------------- Physician Orders Details Patient Name: Date of Service: Gladys Damme, Chad Hall. 12/08/2019 9:15 A M Medical Record Number: [id number] Patient Account Number: [id number] Date of Birth/Sex: Treating RN: 05/07/1950 (69 y.o. 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 Nurse Visit: - Tuesday for dressing change unless home health begins Dressing Change Frequency Change dressing three times week. - all woiunds Skin Barriers/Peri-Wound Care Barrier cream - to macerated areas of foot Moisturizing lotion - to leg Wound Cleansing Wound #1 Left,Dorsal Foot Clean wound with Wound Cleanser May shower with protection. - cast protector Primary Wound Dressing Wound #1 Left,Dorsal Foot Hydrofera Blue - classic Wound #2 Left Amputation Site - Toe Hydrofera Blue - classic Wound #3 Left T Fourth oe Hydrofera Blue - classic Wound #4 Left Metatarsal head fourth Hydrofera Blue - classic Wound #5 Left Calcaneus Hydrofera Blue - classic Secondary Dressing Wound #1 Left,Dorsal Foot Dry Gauze ABD pad Wound #2 Left Amputation Site - Toe Dry Gauze Wound #3 Left T Fourth oe Dry Gauze Wound #4 Left Metatarsal head fourth Dry Gauze Wound #5 Left Calcaneus Dry Gauze Heel Cup Edema Control 3 Layer Compression System - Left Lower Extremity - stockinnette to cover all toes so patient does not have to change dressing Avoid standing for long periods of time Elevate legs to the level of the heart or above for 30 minutes daily and/or when sitting, a frequency of: Off-Loading Open toe surgical shoe to: Home Health dmit to [facility] - for dressing changes 2-3 times per week A Consults Infectious Disease - ASAP for wagner grade 3 diabetic foot ucers left foot and acute osteomyelitis left calcaneoius Laboratory Bacteria identified in Tissue by Biopsy culture (MICRO) - left heel LOINC Code: 67341-9 Convenience Name: Biopsy specimen culture naerobe culture (MICRO) - bone culture left heel Bacteria identified in Unspecified specimen by A LOINC Code: 379-0 Convenience Name: Anerobic culture Electronic Signature(s) Signed: 12/08/2019 4:52:36 PM By: Linton Ham MD Signed: 12/08/2019 5:21:31 PM By: Baruch Gouty RN, BSN Entered By: Baruch Gouty on 12/08/2019 10:55:53 Prescription 12/08/2019 -------------------------------------------------------------------------------- Lamar Benes MD Patient Name: Provider: [birth year] [id number] Date of Birth: NPI#: Jerilynn Mages [id number] Sex: DEA #: [phone] [id number] Phone #: License #: [place] Patient Address: [place] [address] [place], [place] [address] [address] [place], [place] [address] [phone] Allergies No Known Drug Intolerances Provider's Orders Infectious Disease - ASAP for wagner grade 3 diabetic foot ucers left foot and acute osteomyelitis left calcaneoius Hand Signature: Date(s): Electronic Signature(s) Signed: 12/08/2019 4:52:36 PM By: Linton Ham MD Signed: 12/08/2019 5:21:31 PM By: Baruch Gouty RN, BSN Entered By: Baruch Gouty on 12/08/2019 10:55:54 -------------------------------------------------------------------------------- Problem List Details Patient Name: Date of Service: Gladys Damme, Chad Hall. 12/08/2019 9:15 A M Medical Record Number: [id number] Patient Account Number: [id number] Date of Birth/Sex: Treating RN: 1950-05-07 (69 y.o. 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 11/24/2019 No Yes M86.172 Other acute osteomyelitis, left ankle and foot 12/08/2019 No Yes L97.528 Non-pressure  chronic ulcer of other part of left foot with other specified 11/24/2019 No Yes severity I87.332 Chronic venous hypertension (idiopathic) with ulcer and inflammation of left 11/24/2019 No Yes lower extremity Inactive Problems Resolved Problems Electronic Signature(s) Signed: 12/08/2019 4:52:36 PM By: Linton Ham MD Signed: 12/08/2019 5:21:31 PM By: Baruch Gouty RN, BSN Entered By: Baruch Gouty on 12/08/2019 12:18:24 -------------------------------------------------------------------------------- Progress Note Details Patient Name: Date of Service: Chad Pear MA S Hall. 12/08/2019 9:15 A M Medical Record Number: [id number] Patient Account Number: [id number] Date of Birth/Sex: Treating RN: Jun 30, 1950 (69 y.o. Chad Hall Primary Care Provider: Myrle Sheng, Collier Salina Other Clinician: Referring Provider: Treating Provider/Extender: Francella Solian Weeks in Treatment: 2 Subjective History of Present Illness (HPI) ADMISSION 11/24/2019 Mr. Folson is a 69 year old man with type 2 diabetes well controlled peripheral neuropathy and blindness secondary to remote bilateral retinal detachments I believe. His problem with regards to his left foot began it in November 2020. He developed osteomyelitis of his left toe/MTP and underwent a left third ray amputation by Dr. Sharol Given. He developed a wound dehiscence by early December and he was followed for this by Dr. Jess Barters office for most of this year. Most recently he has been using silver alginate. Trental and nitroglycerin patches have also been used. Early in September it was determined that the third grade dehiscence had closed over but for the first time Hall of a wound on the fourth toe, fourth met head and left plantar heel. Dr. Sharol Given had talked about a transmetatarsal amputation but that certainly would not address the very significant area on his heel now and also there was some conversation about a BKA. The patient is here  to see what can be done to save his foot The patient is a well-controlled type II diabetic on oral agents. He also has severe chronic venous insufficiency, Marfan's, hyperlipidemia, thoracic aortic aneurysm, blindness. 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 He was on his feet for quite a period of time but he tells me he is not working currently. His ABI in our clinic was 1.03 on the left he was not felt in Dr. Jess Barters office based on bedside Dopplers to have arterial insufficiency. This seems quite correct 10/15 this is a patient who has 3 open wounds at surgical site wound on a third ray amputation site, and area over the fourth met  head and a deep area over the plantar left heel. His MRI is booked for 10/20. Based on the MRI we will need to make a decision whether we attempt surgical or medical treatment for underlying infection if this is present versus offloading with a total contact cast. Even the total contact cast might have obstacles given his visual issues/balance. He lives with his wife who is also visually impaired. He takes Scat into the clinic. He also has severe chronic venous insufficiency which I think is added to the difficulties in healing of the surgical wound largely in the dorsal foot just proximal to the third toe amputation. We have been using silver alginate. I changed him to [facility] today 10/22; patient's MRI is come back suggesting subtle subcortical bone marrow edema and enhancement along the posterior and plantar surfaces of the calcaneal tuberosity either reactive osteitis or acute early osteomyelitis. The third ray amputation site did not have any obvious pathologic findings to suggest infection there was no joint effusion.. He has wound over the fourth metatarsal head there was no comment about this either. Given the fact that the patient has a probing wound on his heel to bone I suspect he does have osteomyelitis. I am going to  send him to infectious disease for antibiotic management probably IV antibiotics. I have done a bone culture and pathology today from the heel. He came in today with his leg wrapped apparently there was quite an odor we will try to get home health to change this at least once Objective Constitutional Patient is hypertensive.. Pulse regular and within target range for patient.Marland Kitchen Respirations regular, non-labored and within target range.. Temperature is normal and within the target range for the patient.Marland Kitchen Appears in no distress. Vitals Time Taken: 9:39 AM, Height: 77 in, Weight: 210 lbs, BMI: 24.9, Temperature: 98.1 F, Pulse: 90 bpm, Respiratory Rate: 18 breaths/min, Blood Pressure: 167/81 mmHg. General Notes: Wound exam ooThe third ray amputation site actually looks completely closed. I think the issue here was too much edema probably lymphedema. ooHe has an area over his left fourth metatarsal head. Considerably hyper granulated with divots around the wound. I remove the hyper granulation with a #15 scalpel hemostasis with silver nitrate ooDeep plantar wound on the heel. This goes straight to bone. Using rongeurs I obtain specimens for culture and pathology Integumentary (Hair, Skin) Wound #1 status is Open. Original cause of wound was Gradually Appeared. The wound is located on the Left,Dorsal Foot. The wound measures 10cm length x 9cm width x 0.1cm depth; 70.686cm^2 area and 7.069cm^3 volume. There is Fat Layer (Subcutaneous Tissue) exposed. There is a large amount of serosanguineous drainage noted. The wound margin is distinct with the outline attached to the wound base. There is medium (34-66%) red, pink granulation within the wound bed. There is a medium (34-66%) amount of necrotic tissue within the wound bed including Adherent Slough. Wound #2 status is Open. Original cause of wound was Gradually Appeared. The wound is located on the Left Amputation Site - T The wound measures  3cm oe. length x 1cm width x 0.4cm depth; 2.356cm^2 area and 0.942cm^3 volume. There is Fat Layer (Subcutaneous Tissue) exposed. There is no tunneling or undermining noted. There is a medium amount of serous drainage noted. The wound margin is distinct with the outline attached to the wound base. There is medium (34-66%) red, pink granulation within the wound bed. There is a medium (34-66%) amount of necrotic tissue within the wound bed including Adherent Slough.  Wound #3 status is Open. Original cause of wound was Gradually Appeared. The wound is located on the Left T Fourth. The wound measures 0.5cm length x oe 1cm width x 0.3cm depth; 0.393cm^2 area and 0.118cm^3 volume. There is Fat Layer (Subcutaneous Tissue) exposed. There is no tunneling or undermining noted. There is a medium amount of serous drainage noted. The wound margin is distinct with the outline attached to the wound base. There is medium (34- 66%) red, pink granulation within the wound bed. There is a medium (34-66%) amount of necrotic tissue within the wound bed including Adherent Slough. Wound #4 status is Open. Original cause of wound was Gradually Appeared. The wound is located on the Left Metatarsal head fourth. The wound measures 1.5cm length x 1.6cm width x 1cm depth; 1.885cm^2 area and 1.885cm^3 volume. There is Fat Layer (Subcutaneous Tissue) exposed. There is no tunneling or undermining noted. There is a large amount of serosanguineous drainage noted. The wound margin is distinct with the outline attached to the wound base. There is medium (34-66%) red, pink granulation within the wound bed. There is a medium (34-66%) amount of necrotic tissue within the wound bed including Adherent Slough. Wound #5 status is Open. Original cause of wound was Gradually Appeared. The wound is located on the Left Calcaneus. The wound measures 1.5cm length x 1.9cm width x 1.7cm depth; 2.238cm^2 area and 3.805cm^3 volume. There is Fat Layer  (Subcutaneous Tissue) exposed. There is no tunneling or undermining noted. There is a large amount of serosanguineous drainage noted. The wound margin is distinct with the outline attached to the wound base. There is medium (34-66%) red, pink granulation within the wound bed. There is a medium (34-66%) amount of necrotic tissue within the wound bed including Adherent Slough. 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 There was a Excisional Skin/Subcutaneous Tissue Debridement with a total area of 2.4 sq cm performed by Ricard Dillon., MD. With the following instrument(s): Blade to remove Viable and Non-Viable tissue/material. Material removed includes Subcutaneous Tissue and Slough and after achieving pain control using Other (benzocaine 20% spray). No specimens were taken. A time out was conducted at 10:40, prior to the start of the procedure. A Minimum amount of bleeding was controlled with Silver Nitrate. The procedure was tolerated well with a pain level of 0 throughout and a pain level of 0 following the procedure. Post Debridement Measurements: 1.5cm length x 1.6cm width x 1cm depth; 1.885cm^3 volume. Character of Wound/Ulcer Post Debridement is improved. Severity of Tissue Post Debridement is: Fat layer exposed. Post procedure Diagnosis Wound #4: Same as  Pre-Procedure Wound #5 Pre-procedure diagnosis of Wound #5 is a Diabetic Wound/Ulcer of the Lower Extremity located on the Left Calcaneus .Severity of Tissue Pre Debridement is: Necrosis of bone. There was a Excisional Skin/Subcutaneous Tissue/Muscle/Bone Debridement with a total area of 0.25 sq cm performed by Ricard Dillon., MD. With the following instrument(s): Rongeur to remove Viable and Non-Viable tissue/material. Material removed  includes Bone,Subcutaneous Tissue and after achieving pain control using Other (benzocaine 20% spray). No specimens were taken. A time out was conducted at 10:40, prior to the start of the procedure. A Minimum amount of bleeding was controlled with Silver Nitrate. The procedure was tolerated well with a pain level of 0 throughout and a pain level of 0 following the procedure. Post Debridement Measurements: 1.5cm length x 1.9cm width x 1.7cm depth; 3.805cm^3 volume. Character of Wound/Ulcer Post Debridement is improved. Severity of Tissue Post Debridement is: Necrosis of bone. Post procedure Diagnosis Wound #5: Same as Pre-Procedure Plan Follow-up Appointments: Return Appointment in 1 week. Nurse Visit: - Tuesday for dressing change unless home health begins Dressing Change Frequency: Change dressing three times week. - all woiunds Skin Barriers/Peri-Wound Care: Barrier cream - to macerated areas of foot Moisturizing lotion - to leg Wound Cleansing: Wound #1 Left,Dorsal Foot: Clean wound with Wound Cleanser May shower with protection. - cast protector Primary Wound Dressing: Wound #1 Left,Dorsal Foot: Hydrofera Blue - classic Wound #2 Left Amputation Site - Toe: Hydrofera Blue - classic Wound #3 Left T Fourth: oe Hydrofera Blue - classic Wound #4 Left Metatarsal head fourth: Hydrofera Blue - classic Wound #5 Left Calcaneus: Hydrofera Blue - classic Secondary Dressing: Wound #1 Left,Dorsal Foot: Dry Gauze ABD pad Wound #2 Left Amputation Site  - Toe: Dry Gauze Wound #3 Left T Fourth: oe Dry Gauze Wound #4 Left Metatarsal head fourth: Dry Gauze Wound #5 Left Calcaneus: Dry Gauze Heel Cup Edema Control: 3 Layer Compression System - Left Lower Extremity - stockinnette to cover all toes so patient does not have to change dressing Avoid standing for long periods of time Elevate legs to the level of the heart or above for 30 minutes daily and/or when sitting, a frequency of: Off-Loading: Open toe surgical shoe to: Home Health: Admit to [facility] - for dressing changes 2-3 times per week Consults ordered were: Infectious Disease - ASAP for wagner grade 3 diabetic foot ucers left foot and acute osteomyelitis left calcaneoius Laboratory ordered were: Biopsy specimen - left heel, Anerobic culture - bone culture left heel 1. The area in his surgical site is healed 2. The weeping at the areas on his dorsal foot are a lot better I think both of these were secondary to uncontrolled lymphedema 3. He has an area over the fourth metatarsal head which I debrided today with hyper granulation but there was nothing on the MRI that really suggested a deep infection. 4. I think he does have acute osteomyelitis of the left heel. I am really not even certain about the history of this wound. Specimens of bone for culture and pathology. I am referring him to infectious disease. 5. We are working on home health Was 6 Hydrofera [place] Signature(s) Signed: 12/08/2019 4:52:36 PM By: Linton Ham MD Entered By: Linton Ham on 12/08/2019 11:03:25 -------------------------------------------------------------------------------- SuperBill Details Patient Name: Date of Service: Chad Hall, Loann Quill. 12/08/2019 Medical Record Number: [id number] Patient Account Number: [id number] Date of Birth/Sex: Treating RN: 1950/11/08 (69 y.o. 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 M86.172 Other acute osteomyelitis, left ankle and foot L97.528  Non-pressure chronic ulcer of other part of left foot with other specified severity I87.332 Chronic venous hypertension (idiopathic) with ulcer and inflammation of left lower extremity Facility Procedures CPT4 Code: [id number] Description: 62563 - DEB SUBQ TISSUE 20 SQ CM/< ICD-10 Diagnosis Description L97.528 Non-pressure chronic ulcer of other part of left foot with other specified sever Modifier: ity Quantity: 1 CPT4 Code: [id number] Description: 76811 - DEB BONE 20 SQ CM/< ICD-10 Diagnosis Description 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 Modifier: Quantity: 1 Physician Procedures CPT4: Description Modifier Code [id number] 55974 - WC PHYS SUBQ TISS 20 SQ CM ICD-10 Diagnosis Description L97.528 Non-pressure chronic ulcer of other part of left foot with other specified severity Quantity: 1 CPT4: 1638453 Debridement; bone (includes epidermis, dermis, subQ tissue, muscle and/or fascia, if performed) 1st 20 sqcm or less ICD-10 Diagnosis Description 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

## 2019-12-12 ENCOUNTER — Other Ambulatory Visit: Payer: Self-pay

## 2019-12-12 ENCOUNTER — Encounter (HOSPITAL_BASED_OUTPATIENT_CLINIC_OR_DEPARTMENT_OTHER): Payer: Medicare Other | Admitting: Internal Medicine

## 2019-12-12 DIAGNOSIS — E11621 Type 2 diabetes mellitus with foot ulcer: Secondary | ICD-10-CM | POA: Diagnosis not present

## 2019-12-12 LAB — AEROBIC/ANAEROBIC CULTURE W GRAM STAIN (SURGICAL/DEEP WOUND): Gram Stain: NONE SEEN

## 2019-12-12 NOTE — Progress Notes (Signed)
SUSUMU, HACKLER ([id number]) Visit Report for 12/12/2019 SuperBill Details Patient Name: Date of Service: Chad Hall [place] 12/12/2019 Medical Record Number: [id number] Patient Account Number: [id number] Date of Birth/Sex: Treating RN: 10/27/1950 (69 y.o. 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

## 2019-12-14 NOTE — Progress Notes (Signed)
KAHLEN, MORAIS ([id number]) Visit Report for 12/12/2019 Arrival Information Details Patient Name: Date of Service: Jerelyn Scott [place]. 12/12/2019 11:00 A M Medical Record Number: [id number] Patient Account Number: [id number] Date of Birth/Sex: Treating RN: 01/11/1951 (69 y.o. Male) Shawn Stall Primary Care Tung Pustejovsky: Kandis Fantasia, Theron Arista Other Clinician: Referring Alexza Norbeck: Treating Mikyle Sox/Extender: Janifer Adie Weeks in Treatment: 2 Visit Information History Since Last Visit Added or deleted any medications: No Patient Arrived: Gilmer Mor Any new allergies or adverse reactions: No Arrival Time: 10:53 Had a fall or experienced change in No Accompanied By: self activities of daily living that may affect Transfer Assistance: None risk of falls: Patient Identification Verified: Yes Signs or symptoms of abuse/neglect since last visito No Secondary Verification Process Completed: Yes Hospitalized since last visit: No Patient Requires Transmission-Based Precautions: No Implantable device outside of the clinic excluding No Patient Has Alerts: No cellular tissue based products placed in the center since last visit: Has Dressing in Place as Prescribed: Yes Pain Present Now: No Electronic Signature(s) Signed: 12/12/2019 3:39:47 PM By: Karl Ito Entered By: Karl Ito on 12/12/2019 10:53:20 -------------------------------------------------------------------------------- Compression Therapy Details Patient Name: Date of Service: Jerelyn Scott MA S W. 12/12/2019 11:00 A M Medical Record Number: [id number] Patient Account Number: [id number] Date of Birth/Sex: Treating RN: [birth year] 69 y.o. Male) Zandra Abts Primary Care Maks Cavallero: Alanda Amass Other Clinician: Referring Brekyn Huntoon: Treating Kaelob Persky/Extender: Mikey [employer] NK, PETER Weeks in Treatment: 2 Compression Therapy Performed for Wound Assessment: Wound #1 Left,Dorsal Foot Performed By:  Clinician Zandra Abts, RN Compression Type: Three Emergency planning/management officer) Signed: 12/12/2019 5:15:49 PM By: Zandra Abts RN, BSN Entered By: Zandra Abts on 12/12/2019 11:58:40 -------------------------------------------------------------------------------- Compression Therapy Details Patient Name: Date of Service: Jerelyn Scott MA S W. 12/12/2019 11:00 A M Medical Record Number: [id number] Patient Account Number: [id number] Date of Birth/Sex: Treating RN: 1950-07-10 (69 y.o. Male) Zandra Abts Primary Care Aryahi Denzler: Other Clinician: Kandis Fantasia, PETER Referring Masiya Claassen: Treating Suzann Lazaro/Extender: [employer] NK, PETER Weeks in Treatment: 2 Compression Therapy Performed for Wound Assessment: Wound #4 Left Metatarsal head fourth Performed By: Clinician Zandra Abts, RN Compression Type: Three Emergency planning/management officer) Signed: 12/12/2019 5:15:49 PM By: Zandra Abts RN, BSN Entered By: Zandra Abts on 12/12/2019 11:58:41 -------------------------------------------------------------------------------- Compression Therapy Details Patient Name: Date of Service: Donney Dice, Barkley Boards MA S W. 12/12/2019 11:00 A M Medical Record Number: [id number] Patient Account Number: [id number] Date of Birth/Sex: Treating RN: 1950/07/24 (69 y.o. Male) Zandra Abts Primary Care Vaibhav Fogleman: Alanda Amass Other Clinician: Referring Navjot Loera: Treating Zarya Lasseigne/Extender: [employer] NK, PETER Weeks in Treatment: 2 Compression Therapy Performed for Wound Assessment: Wound #5 Left Calcaneus Performed By: Clinician Zandra Abts, RN Compression Type: Three Emergency planning/management officer) Signed: 12/12/2019 5:15:49 PM By: Zandra Abts RN, BSN Entered By: Zandra Abts on 12/12/2019 11:58:41 -------------------------------------------------------------------------------- Compression Therapy Details Patient Name: Date of Service: Donney Dice, Barkley Boards MA S W. 12/12/2019 11:00  A M Medical Record Number: [id number] Patient Account Number: [id number] Date of Birth/Sex: Treating RN: [birth year] (69 y.o. Male) Zandra Abts Primary Care Elmon Shader: Alanda Amass Other Clinician: Referring Nuria Phebus: Treating Ebbie Sorenson/Extender: [employer] NK, PETER Weeks in Treatment: 2 Compression Therapy Performed for Wound Assessment: Wound #2 Left Amputation Site - Toe Performed By: Clinician Zandra Abts, RN Compression Type: Three Emergency planning/management officer) Signed: 12/12/2019 5:15:49 PM By: Zandra Abts RN, BSN Entered By: Zandra Abts on 12/12/2019 11:58:41 -------------------------------------------------------------------------------- Encounter Discharge Information Details Patient Name: Date of Service: [name],  THO MA S W. 12/12/2019 11:00 A M Medical Record Number: [id number] Patient Account Number: [id number] Date of Birth/Sex: Treating RN: February 08, 1951 (69 y.o. Male) Zandra Abts Primary Care Tayllor Breitenstein: Kandis Fantasia, PETER Other Clinician: Referring Khiana Camino: Treating Randa Riss/Extender: Linton Rump in Treatment: 2 Encounter Discharge Information Items Discharge Condition: Stable Ambulatory Status: Cane Discharge Destination: Home Transportation: Private Auto Accompanied By: alone Schedule Follow-up Appointment: Yes Clinical Summary of Care: Patient Declined Electronic Signature(s) Signed: 12/12/2019 5:15:49 PM By: Zandra Abts RN, BSN Entered By: Zandra Abts on 12/12/2019 12:03:36 -------------------------------------------------------------------------------- Wound Assessment Details Patient Name: Date of Service: Jerelyn Scott MA S W. 12/12/2019 11:00 A M Medical Record Number: [id number] Patient Account Number: [id number] Date of Birth/Sex: Treating RN: [birth year] (69 y.o. Male) Shawn Stall Primary Care Baran Kuhrt: Alanda Amass Other Clinician: Referring Pearle Wandler: Treating Brahim Dolman/Extender: [employer] NK, PETER Weeks in Treatment: 2 Wound Status Wound Number: 1 Primary Etiology: Diabetic Wound/Ulcer of the Lower Extremity Wound Location: Left, Dorsal Foot Wound Status: Open Wounding Event: Gradually Appeared Date Acquired: 08/17/2019 Weeks Of Treatment: 2 Clustered Wound: No Wound Measurements Length: (cm) 10 Width: (cm) 9 Depth: (cm) 0.1 Area: (cm) 70.686 Volume: (cm) 7.069 % Reduction in Area: 9.1% % Reduction in Volume: 9.1% Wound Description Classification: Grade 3 Treatment Notes Wound #1 (Left, Dorsal Foot) 1. Cleanse With Soap and water 2. Periwound Care Barrier cream Moisturizing lotion 3. Primary Dressing Applied Hydrofera Blue 4. Secondary Dressing ABD Pad Dry Gauze 6. Support Layer Applied 3 layer compression wrap Electronic Signature(s) Signed: 12/12/2019 3:39:47 PM By: Karl Ito Signed: 12/14/2019 5:24:43 PM By: Shawn Stall Entered By: Karl Ito on 12/12/2019 10:53:56 -------------------------------------------------------------------------------- Wound Assessment Details Patient Name: Date of Service: [name], Barkley Boards MA S W. 12/12/2019 11:00 A M Medical Record Number: [id number] Patient Account Number: [id number] Date of Birth/Sex: Treating RN: Jun 21, 1950 (69 y.o. Male) Shawn Stall Primary Care Climmie Buelow: Alanda Amass Other Clinician: Referring Mieshia Pepitone: Treating Biridiana Twardowski/Extender: [employer] NK, PETER Weeks in Treatment: 2 Wound Status Wound Number: 2 Primary Etiology: Diabetic Wound/Ulcer of the Lower Extremity Wound Location: Left Amputation Site - Toe Wound Status: Open Wounding Event: Gradually Appeared Date Acquired: 01/17/2019 Weeks Of Treatment: 2 Clustered Wound: No Wound Measurements Length: (cm) 3 Width: (cm) 1 Depth: (cm) 0.4 Area: (cm) 2.356 Volume: (cm) 0.942 % Reduction in Area: 83.3% % Reduction in Volume: 33.4% Wound Description Classification: Grade 3 Treatment  Notes Wound #2 (Left Amputation Site - Toe) 1. Cleanse With Soap and water 2. Periwound Care Barrier cream Moisturizing lotion 3. Primary Dressing Applied Hydrofera Blue 4. Secondary Dressing ABD Pad Dry Gauze 6. Support Layer Applied 3 layer compression wrap Electronic Signature(s) Signed: 12/12/2019 3:39:47 PM By: Karl Ito Signed: 12/14/2019 5:24:43 PM By: Shawn Stall Entered By: Karl Ito on 12/12/2019 10:53:56 -------------------------------------------------------------------------------- Wound Assessment Details Patient Name: Date of Service: [name], Barkley Boards MA S W. 12/12/2019 11:00 A M Medical Record Number: [id number] Patient Account Number: [id number] Date of Birth/Sex: Treating RN: 1950/08/18 (69 y.o. Male) Elesa Hacker, [place] Primary Care Atif Chapple: Alanda Amass Other Clinician: Referring Mikhail Hallenbeck: Treating Maygen Sirico/Extender: [employer] NK, PETER Weeks in Treatment: 2 Wound Status Wound Number: 3 Primary Etiology: Diabetic Wound/Ulcer of the Lower Extremity Wound Location: Left T Fourth oe Wound Status: Open Wounding Event: Gradually Appeared Date Acquired: 09/17/2019 Weeks Of Treatment: 2 Clustered Wound: No Wound Measurements Length: (cm) 0.5 Width: (cm) 1 Depth: (cm) 0.3 Area: (cm) 0.393 Volume: (cm) 0.118 % Reduction in Area: 91.7% % Reduction  in Volume: 74.9% Wound Description Classification: Grade 3 Treatment Notes Wound #3 (Left Toe Fourth) 1. Cleanse With Soap and water 2. Periwound Care Barrier cream Moisturizing lotion 3. Primary Dressing Applied Hydrofera Blue 4. Secondary Dressing ABD Pad Dry Gauze 6. Support Layer Applied 3 layer compression wrap Electronic Signature(s) Signed: 12/12/2019 3:39:47 PM By: Karl Ito Signed: 12/14/2019 5:24:43 PM By: Shawn Stall Entered By: Karl Ito on 12/12/2019 10:53:57 -------------------------------------------------------------------------------- Wound  Assessment Details Patient Name: Date of Service: [name], Barkley Boards MA S W. 12/12/2019 11:00 A M Medical Record Number: [id number] Patient Account Number: [id number] Date of Birth/Sex: Treating RN: 1950/11/13 (69 y.o. Male) Elesa Hacker, [place] Primary Care Jonh Mcqueary: Alanda Amass Other Clinician: Referring Trumaine Wimer: Treating Yamilet Mcfayden/Extender: [employer] NK, PETER Weeks in Treatment: 2 Wound Status Wound Number: 4 Primary Etiology: Diabetic Wound/Ulcer of the Lower Extremity Wound Location: Left Metatarsal head fourth Wound Status: Open Wounding Event: Gradually Appeared Date Acquired: 08/17/2019 Weeks Of Treatment: 2 Clustered Wound: No Wound Measurements Length: (cm) 1.5 Width: (cm) 1.6 Depth: (cm) 1 Area: (cm) 1.885 Volume: (cm) 1.885 % Reduction in Area: -33.3% % Reduction in Volume: -1236.9% Wound Description Classification: Grade 3 Treatment Notes Wound #4 (Left Metatarsal head fourth) 1. Cleanse With Soap and water 2. Periwound Care Barrier cream Moisturizing lotion 3. Primary Dressing Applied Hydrofera Blue 4. Secondary Dressing ABD Pad Dry Gauze 6. Support Layer Applied 3 layer compression wrap Electronic Signature(s) Signed: 12/12/2019 3:39:47 PM By: Karl Ito Signed: 12/14/2019 5:24:43 PM By: Shawn Stall Entered By: Karl Ito on 12/12/2019 10:53:57 -------------------------------------------------------------------------------- Wound Assessment Details Patient Name: Date of Service: [name], Barkley Boards MA S W. 12/12/2019 11:00 A M Medical Record Number: [id number] Patient Account Number: [id number] Date of Birth/Sex: Treating RN: Jun 16, 1950 (69 y.o. Male) Elesa Hacker, [place] Primary Care Ellie Bryand: Alanda Amass Other Clinician: Referring Rikki Trosper: Treating Nicholas Trompeter/Extender: [employer] NK, PETER Weeks in Treatment: 2 Wound Status Wound Number: 5 Primary Etiology: Diabetic Wound/Ulcer of the Lower Extremity Wound Location: Left  Calcaneus Wound Status: Open Wounding Event: Gradually Appeared Date Acquired: 09/17/2019 Weeks Of Treatment: 2 Clustered Wound: No Wound Measurements Length: (cm) 1.5 Width: (cm) 1.9 Depth: (cm) 1.7 Area: (cm) 2.238 Volume: (cm) 3.805 % Reduction in Area: 24% % Reduction in Volume: -84.5% Wound Description Classification: Grade 3 Treatment Notes Wound #5 (Left Calcaneus) 1. Cleanse With Soap and water 2. Periwound Care Barrier cream Moisturizing lotion 3. Primary Dressing Applied Hydrofera Blue 4. Secondary Dressing Dry Gauze Heel Cup 6. Support Layer Applied 3 layer compression wrap Electronic Signature(s) Signed: 12/12/2019 3:39:47 PM By: Karl Ito Signed: 12/14/2019 5:24:43 PM By: Shawn Stall Entered By: Karl Ito on 12/12/2019 10:53:57 -------------------------------------------------------------------------------- Vitals Details Patient Name: Date of Service: [name], Barkley Boards MA S W. 12/12/2019 11:00 A M Medical Record Number: [id number] Patient Account Number: [id number] Date of Birth/Sex: Treating RN: 1951-01-25 (69 y.o. Male) Shawn Stall Primary Care Yariah Selvey: Alanda Amass Other Clinician: Referring Secundino Ellithorpe: Treating Edrian Melucci/Extender: [employer] NK, PETER Weeks in Treatment: 2 Vital Signs Time Taken: 10:53 Temperature (F): 98.6 Height (in): 77 Pulse (bpm): 98 Weight (lbs): 210 Respiratory Rate (breaths/min): 18 Body Mass Index (BMI): 24.9 Blood Pressure (mmHg): 150/87 Reference Range: 80 - 120 mg / dl Electronic Signature(s) Signed: 12/12/2019 3:39:47 PM By: Karl Ito Entered By: Karl Ito on 12/12/2019 10:53:35

## 2019-12-15 ENCOUNTER — Other Ambulatory Visit: Payer: Self-pay

## 2019-12-15 ENCOUNTER — Encounter (HOSPITAL_BASED_OUTPATIENT_CLINIC_OR_DEPARTMENT_OTHER): Payer: Medicare Other | Admitting: Internal Medicine

## 2019-12-15 DIAGNOSIS — E11621 Type 2 diabetes mellitus with foot ulcer: Secondary | ICD-10-CM | POA: Diagnosis not present

## 2019-12-18 NOTE — Progress Notes (Signed)
TIRAS, BIANCHINI ([id number]) Visit Report for 12/15/2019 Debridement Details Patient Name: Date of Service: [place]. 12/15/2019 8:30 A M Medical Record Number: [id number] Patient Account Number: [id number] Date of Birth/Sex: Treating RN: 1950/06/25 (69 y.o. [name] Primary Care Provider: Myrle Sheng, Collier Salina Other Clinician: Referring Provider: Treating Provider/Extender: Francella Solian Weeks in Treatment: 3 Debridement Performed for Assessment: Wound #4 Left Metatarsal head fourth Performed By: Physician Ricard Dillon., MD Debridement Type: Debridement Severity of Tissue Pre Debridement: Fat layer exposed Level of Consciousness (Pre-procedure): Awake and Alert Pre-procedure Verification/Time Out Yes - 09:05 Taken: Start Time: 09:06 Pain Control: Other : benzocaine 20% spray T Area Debrided (L x W): otal 1.5 (cm) x 1.4 (cm) = 2.1 (cm) Tissue and other material debrided: Viable, Non-Viable, Callus, Slough, Subcutaneous, Slough Level: Skin/Subcutaneous Tissue Debridement Description: Excisional Instrument: Curette Bleeding: Minimum Hemostasis Achieved: Silver Nitrate End Time: 09:09 Procedural Pain: 0 Post Procedural Pain: 0 Response to Treatment: Procedure was tolerated well Level of Consciousness (Post- Awake and Alert procedure): Post Debridement Measurements of Total Wound Length: (cm) 1.5 Width: (cm) 1.4 Depth: (cm) 0.2 Volume: (cm) 0.33 Character of Wound/Ulcer Post Debridement: Improved Severity of Tissue Post Debridement: Fat layer exposed Post Procedure Diagnosis Same as Pre-procedure Electronic Signature(s) Signed: 12/15/2019 4:23:42 PM By: Baruch Gouty RN, BSN Signed: 12/18/2019 2:10:44 PM By: Linton Ham MD Entered By: Linton Ham on 12/15/2019 09:16:51 -------------------------------------------------------------------------------- HPI Details Patient Name: Date of Service: [name] MS, [name] MA S W.  12/15/2019 8:30 A M Medical Record Number: [id number] Patient Account Number: [id number] Date of Birth/Sex: Treating RN: [birth year] (69 y.o. [name] Primary Care Provider: Myrle Sheng, Collier Salina Other Clinician: Referring Provider: Treating Provider/Extender: Francella Solian Weeks in Treatment: 3 History of Present Illness HPI Description: ADMISSION 11/24/2019 Mr. Zajkowski is a 69 year old man with type 2 diabetes well controlled peripheral neuropathy and blindness secondary to remote bilateral retinal detachments I believe. His problem with regards to his left foot began it in November 2020. He developed osteomyelitis of his left toe/MTP and underwent a left third ray amputation by Dr. Sharol Given. He developed a wound dehiscence by early December and he was followed for this by Dr. Jess Barters office for most of this year. Most recently he has been using silver alginate. Trental and nitroglycerin patches have also been used. Early in September it was determined that the third grade dehiscence had closed over but for the first time mention of a wound on the fourth toe, fourth met head and left plantar heel. Dr. Sharol Given had talked about a transmetatarsal amputation but that certainly would not address the very significant area on his heel now and also there was some conversation about a BKA. The patient is here to see what can be done to save his foot The patient is a well-controlled type II diabetic on oral agents. He also has severe chronic venous insufficiency, Marfan's, hyperlipidemia, thoracic aortic aneurysm, blindness. 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 He was on his feet for quite a period of time but he tells me he is not working currently. His ABI in our clinic was 1.03 on the left he was not felt in Dr. Jess Barters office based on bedside Dopplers to have arterial insufficiency. This seems quite correct 10/15 this is a patient who has 3 open wounds at  surgical site wound on a third ray amputation site, and area over the fourth met head  and a deep area over the plantar left heel. His MRI is booked for 10/20. Based on the MRI we will need to make a decision whether we attempt surgical or medical treatment for underlying infection if this is present versus offloading with a total contact cast. Even the total contact cast might have obstacles given his visual issues/balance. He lives with his wife who is also visually impaired. He takes Scat into the clinic. He also has severe chronic venous insufficiency which I think is added to the difficulties in healing of the surgical wound largely in the dorsal foot just proximal to the third toe amputation. We have been using silver alginate. I changed him to [facility] today 10/22; patient's MRI is come back suggesting subtle subcortical bone marrow edema and enhancement along the posterior and plantar surfaces of the calcaneal tuberosity either reactive osteitis or acute early osteomyelitis. The third ray amputation site did not have any obvious pathologic findings to suggest infection there was no joint effusion.. He has wound over the fourth metatarsal head there was no comment about this either. Given the fact that the patient has a probing wound on his heel to bone I suspect he does have osteomyelitis. I am going to send him to infectious disease for antibiotic management probably IV antibiotics. I have done a bone culture and pathology today from the heel. He came in today with his leg wrapped apparently there was quite an odor we will try to get home health to change this at least once 10/29; the patient's third ray amputation dehiscence has healed as have the numerous small areas on his dorsal foot. All of this I think a complication of uncontrolled swelling from chronic venous insufficiency and lymphedema together with very dry dyshidrotic skin. The major concerns I have are wounds on his plantar  heel which probes to bone. MRI suggested early osteomyelitis of this area. Bone culture I did showed group B strep and a few anaerobes. I note today that the anaerobes were beta-lactam positive I actually put him on Augmentin which may not of been the right choice HOWEVER the patient has an appointment with infectious disease on Tuesday so I will leave further antibiotics to them. I did also note that the pathology for the bone I sent did not show osteomyelitis although this may not have been the right bone area. In any case I think the patient needs to be treated. The patient is blind. I think he is going to need a total contact cast if he can tolerate this because of balance issues however I want to be sure that we adequately treat his infection in the heel before going this route. Electronic Signature(s) Signed: 12/18/2019 2:10:44 PM By: Linton Ham MD Entered By: Linton Ham on 12/15/2019 09:19:23 -------------------------------------------------------------------------------- Physical Exam Details Patient Name: Date of Service: Gladys Damme, [name] MA S W. 12/15/2019 8:30 A M Medical Record Number: [id number] Patient Account Number: [id number] Date of Birth/Sex: Treating RN: Mar 31, 1950 (69 y.o. [name] Primary Care Provider: Myrle Sheng, Collier Salina Other Clinician: Referring Provider: Treating Provider/Extender: Burnis Kingfisher NK, PETER Weeks in Treatment: 3 Constitutional Patient is hypertensive.. Pulse regular and within target range for patient.Marland Kitchen Respirations regular, non-labored and within target range.. Temperature is normal and within the target range for the patient.Marland Kitchen Appears in no distress. Notes Wound exam Third ray amputation site and dorsal foot are closed Deep probing wound to bone on the plantar heel. This does not look too much different from last  week we will use silver alginate. He has an area over the plantar fourth metatarsal head circumferential undermining  no tissue adherence. I used a #3 curette to remove 2 aggressively debride this area including roughing up the margins on both sides of the undermining area. Electronic Signature(s) Signed: 12/18/2019 2:10:44 PM By: Linton Ham MD Entered By: Linton Ham on 12/15/2019 09:20:30 -------------------------------------------------------------------------------- Physician Orders Details Patient Name: Date of Service: Gladys Damme, [name] MA S W. 12/15/2019 8:30 A M Medical Record Number: [id number] Patient Account Number: [id number] Date of Birth/Sex: Treating RN: [birth year] (69 y.o. 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 Nurse Visit: - Tuesday for dressing change unless home health begins Dressing Change Frequency Change dressing three times week. - all wounds if home health available Skin Barriers/Peri-Wound Care Barrier cream - to macerated areas of foot Moisturizing lotion - to leg TCA Cream or Ointment - mixed with lotion to left leg Wound Cleansing Clean wound with Wound Cleanser May shower with protection. Primary Wound Dressing Wound #2 Left Amputation Site - Toe Hydrofera Blue - classic Wound #3 Left T Fourth oe Hydrofera Blue -  classic Wound #4 Left Metatarsal head fourth Iodoflex Wound #5 Left Calcaneus lginate with Silver - packing Calcium A Secondary Dressing Wound #2 Left Amputation Site - Toe Dry Gauze Wound #3 Left T Fourth oe Dry Gauze Wound #4 Left Metatarsal head fourth Dry Gauze Wound #5 Left Calcaneus Dry Gauze Heel Cup Edema Control 3 Layer Compression System - Left Lower Extremity - stockinnette to cover all toes so patient does not have to change dressing Avoid standing for long periods of time Elevate legs to the level of the heart or above for 30 minutes daily and/or when sitting, a frequency of: Off-Loading Open toe surgical shoe to: Home Health dmit to [facility] - for dressing changes 2-3 times per week A Patient Medications llergies: No Known Drug Intolerances A Notifications Medication Indication Start End prior to debridement 12/15/2019 benzocaine DOSE topical 20 % aerosol - aerosol topical Electronic Signature(s) Signed: 12/15/2019 4:23:42 PM By: Baruch Gouty RN, BSN Signed: 12/18/2019 2:10:44 PM By: Linton Ham MD Entered By: Baruch Gouty on 12/15/2019 09:13:11 -------------------------------------------------------------------------------- Problem List Details Patient Name: Date of Service: [name] MS, [name] MA S W. 12/15/2019 8:30 A M Medical Record Number: [id number] Patient Account Number: [id number] Date of Birth/Sex: Treating RN: [birth year] (69 y.o. [name] Primary Care Provider: Myrle Sheng, PETER Other Clinician: Referring Provider: Treating Provider/Extender: Francella Solian Weeks in Treatment: 3 Active Problems ICD-10 Encounter Code Description Active Date MDM Diagnosis E11.621 Type 2 diabetes mellitus with foot ulcer 11/24/2019 No Yes T81.31XD Disruption of external operation (surgical) wound, not elsewhere classified, 11/24/2019 No Yes subsequent encounter L97.424 Non-pressure chronic ulcer of left heel  and midfoot with necrosis of bone 11/24/2019 No Yes M86.172 Other acute osteomyelitis, left ankle and foot 12/08/2019 No Yes  I62.703 Non-pressure chronic ulcer of other part of left foot with other specified 11/24/2019 No Yes severity I87.332 Chronic venous hypertension (idiopathic) with ulcer and inflammation of left 11/24/2019 No Yes lower extremity Inactive Problems Resolved Problems Electronic Signature(s) Signed: 12/18/2019 2:10:44 PM By: Linton Ham MD Entered By: Linton Ham on 12/15/2019 09:16:31 -------------------------------------------------------------------------------- Progress Note Details Patient Name: Date of Service: Gladys Damme, [name] MA S W. 12/15/2019 8:30 A M Medical Record Number: [id number] Patient Account Number: [id number] Date of Birth/Sex: Treating RN: August 03, 1950 (69 y.o. [name] Primary Care Provider: Myrle Sheng, Collier Salina Other Clinician: Referring Provider: Treating Provider/Extender: Francella Solian Weeks in Treatment: 3 Subjective History of Present Illness (HPI) ADMISSION 11/24/2019 Mr. Gardella is a 69 year old man with type 2 diabetes well controlled peripheral neuropathy and blindness secondary to remote bilateral retinal detachments I believe. His problem with regards to his left foot began it in November 2020. He developed osteomyelitis of his left toe/MTP and underwent a left third ray amputation by Dr. Sharol Given. He developed a wound dehiscence by early December and he was followed for this by Dr. Jess Barters office for most of this year. Most recently he has been using silver alginate. Trental and nitroglycerin patches have also been used. Early in September it was determined that the third grade dehiscence had closed over but for the first time mention of a wound on the fourth toe, fourth met head and left plantar heel. Dr. Sharol Given had talked about a transmetatarsal amputation but that certainly would not address the very significant area  on his heel now and also there was some conversation about a BKA. The patient is here to see what can be done to save his foot The patient is a well-controlled type II diabetic on oral agents. He also has severe chronic venous insufficiency, Marfan's, hyperlipidemia, thoracic aortic aneurysm, blindness. 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 He was on his feet for quite a period of time but he tells me he is not working currently. His ABI in our clinic was 1.03 on the left he was not felt in Dr. Jess Barters office based on bedside Dopplers to have arterial insufficiency. This seems quite correct 10/15 this is a patient who has 3 open wounds at surgical site wound on a third ray amputation site, and area over the fourth met head and a deep area over the plantar left heel. His MRI is booked for 10/20. Based on the MRI we will need to make a decision whether we attempt surgical or medical treatment for underlying infection if this is present versus offloading with a total contact cast. Even the total contact cast might have obstacles given his visual issues/balance. He lives with his wife who is also visually impaired. He takes Scat into the clinic. He also has severe chronic venous insufficiency which I think is added to the difficulties in healing of the surgical wound largely in the dorsal foot just proximal to the third toe amputation. We have been using silver alginate. I changed him to [facility] today 10/22; patient's MRI is come back suggesting subtle subcortical bone marrow edema and enhancement along the posterior and plantar surfaces of the calcaneal tuberosity either reactive osteitis or acute early osteomyelitis. The third ray amputation site did not have any obvious pathologic findings to suggest infection there was no joint effusion.. He has wound over the fourth metatarsal head there was no comment about this either. Given the fact that the  patient has a  probing wound on his heel to bone I suspect he does have osteomyelitis. I am going to send him to infectious disease for antibiotic management probably IV antibiotics. I have done a bone culture and pathology today from the heel. He came in today with his leg wrapped apparently there was quite an odor we will try to get home health to change this at least once 10/29; the patient's third ray amputation dehiscence has healed as have the numerous small areas on his dorsal foot. All of this I think a complication of uncontrolled swelling from chronic venous insufficiency and lymphedema together with very dry dyshidrotic skin. The major concerns I have are wounds on his plantar heel which probes to bone. MRI suggested early osteomyelitis of this area. Bone culture I did showed group B strep and a few anaerobes. I note today that the anaerobes were beta-lactam positive I actually put him on Augmentin which may not of been the right choice HOWEVER the patient has an appointment with infectious disease on Tuesday so I will leave further antibiotics to them. I did also note that the pathology for the bone I sent did not show osteomyelitis although this may not have been the right bone area. In any case I think the patient needs to be treated. The patient is blind. I think he is going to need a total contact cast if he can tolerate this because of balance issues however I want to be sure that we adequately treat his infection in the heel before going this route. Objective Constitutional Patient is hypertensive.. Pulse regular and within target range for patient.Marland Kitchen Respirations regular, non-labored and within target range.. Temperature is normal and within the target range for the patient.Marland Kitchen Appears in no distress. Vitals Time Taken: 8:21 AM, Height: 77 in, Weight: 210 lbs, BMI: 24.9, Temperature: 99.3 F, Pulse: 98 bpm, Respiratory Rate: 18 breaths/min, Blood Pressure: 164/88 mmHg. General Notes: Wound exam  ooThird ray amputation site and dorsal foot are closed ooDeep probing wound to bone on the plantar heel. This does not look too much different from last week we will use silver alginate. ooHe has an area over the plantar fourth metatarsal head circumferential undermining no tissue adherence. I used a #3 curette to remove 2 aggressively debride this area including roughing up the margins on both sides of the undermining area. Integumentary (Hair, Skin) Wound #1 status is Healed - Epithelialized. Original cause of wound was Gradually Appeared. The wound is located on the Left,Dorsal Foot. The wound measures 0cm length x 0cm width x 0cm depth; 0cm^2 area and 0cm^3 volume. Wound #2 status is Open. Original cause of wound was Gradually Appeared. The wound is located on the Left Amputation Site - T The wound measures oe. 2.9cm length x 0.6cm width x 0.1cm depth; 1.367cm^2 area and 0.137cm^3 volume. There is Fat Layer (Subcutaneous Tissue) exposed. There is no tunneling or undermining noted. There is a medium amount of serosanguineous drainage noted. The wound margin is distinct with the outline attached to the wound base. There is large (67-100%) red, pink granulation within the wound bed. There is a small (1-33%) amount of necrotic tissue within the wound bed including Adherent Slough. Wound #3 status is Open. Original cause of wound was Gradually Appeared. The wound is located on the Left T Fourth. The wound measures 0.4cm length x oe 0.9cm width x 0.3cm depth; 0.283cm^2 area and 0.085cm^3 volume. There is Fat Layer (Subcutaneous Tissue) exposed. There is no tunneling  noted, however, there is undermining starting at 5:00 and ending at 6:00 with a maximum distance of 0.3cm. There is a medium amount of serosanguineous drainage noted. The wound margin is distinct with the outline attached to the wound base. There is medium (34-66%) red, pink granulation within the wound bed. There is a medium (34-66%)  amount of necrotic tissue within the wound bed including Adherent Slough. Wound #4 status is Open. Original cause of wound was Gradually Appeared. The wound is located on the Left Metatarsal head fourth. The wound measures 1.5cm length x 1.4cm width x 0.2cm depth; 1.649cm^2 area and 0.33cm^3 volume. There is Fat Layer (Subcutaneous Tissue) exposed. There is no tunneling or undermining noted. There is a medium amount of serosanguineous drainage noted. The wound margin is distinct with the outline attached to the wound base. There is large (67-100%) red, pink granulation within the wound bed. There is a small (1-33%) amount of necrotic tissue within the wound bed including Adherent Slough. Wound #5 status is Open. Original cause of wound was Gradually Appeared. The wound is located on the Left Calcaneus. The wound measures 2cm length x 2.4cm width x 1.6cm depth; 3.77cm^2 area and 6.032cm^3 volume. There is bone and Fat Layer (Subcutaneous Tissue) exposed. There is no tunneling noted, however, there is undermining starting at 12:00 and ending at 12:00 with a maximum distance of 2.5cm. There is a medium amount of serosanguineous drainage noted. The wound margin is thickened. There is small (1-33%) red, pink granulation within the wound bed. There is a large (67-100%) amount of necrotic tissue within the wound bed including Adherent Slough. 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 There was a Excisional Skin/Subcutaneous Tissue Debridement with a total area of 2.1 sq cm performed by Ricard Dillon., MD. With the following instrument(s): Curette to remove Viable and Non-Viable tissue/material. Material removed includes Callus, Subcutaneous Tissue, and Slough after achieving pain control using Other (benzocaine 20% spray). No specimens were taken. A time out was conducted at 09:05, prior to the start of the procedure. A Minimum amount of bleeding was controlled with Silver Nitrate. The procedure was tolerated well with a pain level of 0 throughout and a pain level of 0 following the procedure. Post Debridement Measurements: 1.5cm length x 1.4cm width x 0.2cm depth; 0.33cm^3 volume. Character of Wound/Ulcer Post Debridement is improved. Severity of Tissue Post Debridement is: Fat layer exposed. Post procedure Diagnosis Wound #4: Same as Pre-Procedure There was a Three Layer Compression Therapy Procedure by Deon Pilling, RN. Post procedure Diagnosis Wound #: Same as Pre-Procedure Plan Follow-up Appointments: Return Appointment in 1 week. Nurse Visit: - Tuesday for dressing change unless home health begins Dressing Change Frequency: Change dressing three times week. - all wounds if home health available Skin Barriers/Peri-Wound Care: Barrier cream - to macerated areas of foot Moisturizing lotion - to leg TCA Cream or Ointment - mixed with lotion to left leg Wound Cleansing: Clean wound with Wound Cleanser May  shower with protection. Primary Wound Dressing: Wound #2 Left Amputation Site - Toe: Hydrofera Blue - classic Wound #3 Left T Fourth: oe Hydrofera Blue - classic Wound #4 Left Metatarsal head fourth: Iodoflex Wound #5 Left Calcaneus: Calcium Alginate with Silver - packing Secondary Dressing: Wound #2 Left Amputation Site - Toe: Dry Gauze Wound  #3 Left T Fourth: oe Dry Gauze Wound #4 Left Metatarsal head fourth: Dry Gauze Wound #5 Left Calcaneus: Dry Gauze Heel Cup Edema Control: 3 Layer Compression System - Left Lower Extremity - stockinnette to cover all toes so patient does not have to change dressing Avoid standing for long periods of time Elevate legs to the level of the heart or above for 30 minutes daily and/or when sitting, a frequency of: Off-Loading: Open toe surgical shoe to: Home Health: Admit to [facility] - for dressing changes 2-3 times per week The following medication(s) was prescribed: benzocaine topical 20 % aerosol aerosol topical for prior to debridement was prescribed at facility 1. [name] surgical wounds and areas on his dorsal foot remain closed. I think this was uncontrolled lymphedema/chronic venous insufficiency 2. We have been using compression and not really should control the swelling in this area 3. Silver alginate to the right heel. Infectious disease on Tuesday although Augmentin may not of been the correct choice for these few anaerobic bacteria he grew I did not change this today. I will be interested in knowing what antibiotics they would choose and whether this would be IV or oral 4. Once we are certain that we eradicate underlying infections I think this man is going to need aggressive offloading possibly with a skin substitute [Dermagraft] 5. I debrided the left fourth met head on both sides of the nonadherent wound. I am hopeful to get some tissue adhesion here using Iodoflex. 6. Silver alginate to the heel Electronic Signature(s) Signed: 12/18/2019 2:10:44 PM By: Linton Ham MD Entered By: Linton Ham on 12/15/2019 09:22:32 -------------------------------------------------------------------------------- SuperBill Details Patient Name: Date of Service: [name] MS, Loann Quill. 12/15/2019 Medical Record Number: [id number] Patient Account Number:  [id number] Date of Birth/Sex: Treating RN: 09/29/1950 (69 y.o. 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 M86.172 Other acute osteomyelitis, left ankle and foot L97.528 Non-pressure chronic ulcer of other part of left foot with other specified severity I87.332 Chronic venous hypertension (idiopathic) with ulcer and inflammation of left lower extremity Facility Procedures Physician Procedures : CPT4 Code Description Modifier [id number] 11042 - WC PHYS SUBQ TISS 20 SQ CM ICD-10 Diagnosis Description L97.528 Non-pressure chronic ulcer of other part of left foot with other specified severity Quantity: 1 Electronic Signature(s) Signed: 12/18/2019 2:10:44 PM By: Linton Ham MD Entered By: Linton Ham on 12/15/2019 09:22:46

## 2019-12-18 NOTE — Progress Notes (Signed)
Chad Hall, Chad Hall ([id number]) Visit Report for 12/15/2019 Arrival Information Details Patient Name: Date of Service: Chad Hall [place]. 12/15/2019 8:30 A M Medical Record Number: [id number] Patient Account Number: [id number] Date of Birth/Sex: Treating RN: 10/16/1950 (69 y.o. Harlon Flor, Millard.Loa Primary Care Ameer Sanden: Kandis Fantasia, Theron Arista Other Clinician: Referring Cheryln Balcom: Treating Truda Staub/Extender: Linton Rump in Treatment: 3 Visit Information History Since Last Visit Added or deleted any medications: No Patient Arrived: Ambulatory Any new allergies or adverse reactions: No Arrival Time: 08:20 Had a fall or experienced change in No Accompanied By: self activities of daily living that may affect Transfer Assistance: None risk of falls: Patient Identification Verified: Yes Signs or symptoms of abuse/neglect since No Secondary Verification Process Completed: Yes last visito Patient Requires Transmission-Based Precautions: No Hospitalized since last visit: No Patient Has Alerts: No Implantable device outside of the clinic No excluding cellular tissue based products placed in the center since last visit: Has Dressing in Place as Prescribed: Yes Has Compression in Place as Prescribed: Yes Has Footwear/Offloading in Place as Yes Prescribed: Left: Surgical Shoe with Pressure Relief Insole Pain Present Now: No Electronic Signature(s) Signed: 12/15/2019 4:26:07 PM By: Shawn Stall Entered By: Shawn Stall on 12/15/2019 08:29:30 -------------------------------------------------------------------------------- Compression Therapy Details Patient Name: Date of Service: Chad Scott MA S W. 12/15/2019 8:30 A M Medical Record Number: [id number] Patient Account Number: [id number] Date of Birth/Sex: Treating RN: 08-Oct-1950 (70 y.o. Damaris Schooner Primary Care Haylo Fake: Kandis Fantasia, Theron Arista Other Clinician: Referring Keeya Dyckman: Treating Deovion Batrez/Extender: Mikey College NK, PETER Weeks in Treatment: 3 Compression Therapy Performed for Wound Assessment: NonWound Condition Lymphedema - Left Leg Performed By: Clinician Shawn Stall, RN Compression Type: Three Layer Post Procedure Diagnosis Same as Pre-procedure Electronic Signature(s) Signed: 12/15/2019 4:23:42 PM By: Zenaida Deed RN, BSN Entered By: Zenaida Deed on 12/15/2019 09:05:54 -------------------------------------------------------------------------------- Encounter Discharge Information Details Patient Name: Date of Service: Chad Blalock MS, Barkley Boards MA S W. 12/15/2019 8:30 A M Medical Record Number: [id number] Patient Account Number: [id number] Date of Birth/Sex: Treating RN: Jun 12, 1950 (69 y.o. Tammy Sours Primary Care Marcha Licklider: Alanda Amass Other Clinician: Referring Arvetta Araque: Treating Edwards Mckelvie/Extender: Linton Rump in Treatment: 3 Encounter Discharge Information Items Post Procedure Vitals Discharge Condition: Stable Temperature (F): 99.3 Ambulatory Status: Ambulatory Pulse (bpm): 98 Discharge Destination: Home Respiratory Rate (breaths/min): 18 Transportation: Private Auto Blood Pressure (mmHg): 164/88 Accompanied By: self Schedule Follow-up Appointment: Yes Clinical Summary of Care: Electronic Signature(s) Signed: 12/15/2019 4:26:07 PM By: Shawn Stall Entered By: Shawn Stall on 12/15/2019 09:37:37 -------------------------------------------------------------------------------- Lower Extremity Assessment Details Patient Name: Date of Service: Chad Scott MA S W. 12/15/2019 8:30 A M Medical Record Number: [id number] Patient Account Number: [id number] Date of Birth/Sex: Treating RN: 08-21-1950 (69 y.o. Tammy Sours Primary Care Prue Lingenfelter: Alanda Amass Other Clinician: Referring Aamna Mallozzi: Treating Nadia Torr/Extender: Mikey College NK, PETER Weeks in Treatment: 3 Edema Assessment Assessed: [Left: Yes] [Right:  No] Edema: [Left: Ye] [Right: s] Calf Left: Right: Point of Measurement: 47 cm From Medial Instep 28.5 cm Ankle Left: Right: Point of Measurement: 12 cm From Medial Instep 23.5 cm Electronic Signature(s) Signed: 12/15/2019 4:26:07 PM By: Shawn Stall Entered By: Shawn Stall on 12/15/2019 08:30:22 -------------------------------------------------------------------------------- Multi Wound Chart Details Patient Name: Date of Service: Chad Blalock MS, Barkley Boards MA S W. 12/15/2019 8:30 A M Medical Record Number: [id number] Patient Account Number: [id number] Date of Birth/Sex: Treating RN: 1950-09-03 (69 y.o. Damaris Schooner Primary Care Shontez Sermon: Kandis Fantasia, Theron Arista  Other Clinician: Referring Shela Esses: Treating Merelyn Klump/Extender: Mikey College NK, PETER Weeks in Treatment: 3 Vital Signs Height(in): 77 Pulse(bpm): 98 Weight(lbs): 210 Blood Pressure(mmHg): 164/88 Body Mass Index(BMI): 25 Temperature(F): 99.3 Respiratory Rate(breaths/min): 18 Photos: [1:No Photos Left, Dorsal Foot] [2:No Photos Left Amputation Site - Toe] [3:No Photos Left T Fourth oe] Wound Location: [1:Gradually Appeared] [2:Gradually Appeared] [3:Gradually Appeared] Wounding Event: [1:Diabetic Wound/Ulcer of the Lower] [2:Diabetic Wound/Ulcer of the Lower] [3:Diabetic Wound/Ulcer of the Lower] Primary Etiology: [1:Extremity N/A] [2:Extremity Hypertension, Type II Diabetes,] [3:Extremity Hypertension, Type II Diabetes,] Comorbid History: [1:08/17/2019] [2:Neuropathy 01/17/2019] [3:Neuropathy 09/17/2019] Date A cquired: [1:3] [2:3] [3:3] Weeks of Treatment: [1:Healed - Epithelialized] [2:Open] [3:Open] Wound Status: [1:0x0x0] [2:2.9x0.6x0.1] [3:0.4x0.9x0.3] Measurements L x W x D (cm) [1:0] [2:1.367] [3:0.283] A (cm) : rea [1:0] [2:0.137] [3:0.085] Volume (cm) : [1:100.00%] [2:90.30%] [3:94.00%] % Reduction in A [1:rea: 100.00%] [2:90.30%] [3:82.00%] % Reduction in Volume: [3:5] Starting Position 1 (o'clock):  [3:6] Ending Position 1 (o'clock): [3:0.3] Maximum Distance 1 (cm): [1:N/A] [2:No] [3:Yes] Undermining: [1:Grade 3] [2:Grade 3] [3:Grade 3] Classification: [1:N/A] [2:Medium] [3:Medium] Exudate A mount: [1:N/A] [2:Serosanguineous] [3:Serosanguineous] Exudate Type: [1:N/A] [2:red, brown] [3:red, brown] Exudate Color: [1:N/A] [2:Distinct, outline attached] [3:Distinct, outline attached] Wound Margin: [1:N/A] [2:Large (67-100%)] [3:Medium (34-66%)] Granulation A mount: [1:N/A] [2:Red, Pink] [3:Red, Pink] Granulation Quality: [1:N/A] [2:Small (1-33%)] [3:Medium (34-66%)] Necrotic A mount: [1:N/A] [2:Large (67-100%)] [3:Small (1-33%)] Epithelialization: [1:N/A] [2:N/A] [3:N/A] Debridement: [1:N/A] [2:N/A] [3:N/A] Pain Control: [1:N/A] [2:N/A] [3:N/A] Tissue Debrided: [1:N/A] [2:N/A] [3:N/A] Level: [1:N/A] [2:N/A] [3:N/A] Debridement A (sq cm): [1:rea N/A] [2:N/A] [3:N/A] Instrument: [1:N/A] [2:N/A] [3:N/A] Bleeding: [1:N/A] [2:N/A] [3:N/A] Hemostasis A chieved: [1:N/A] [2:N/A] [3:N/A] Procedural Pain: [1:N/A] [2:N/A] [3:N/A] Post Procedural Pain: [1:N/A] [2:N/A] [3:N/A] Debridement Treatment Response: [1:N/A] [2:N/A] [3:N/A] Post Debridement Measurements L x W x D (cm) [1:N/A] [2:N/A] [3:N/A] Post Debridement Volume: (cm) [1:N/A] [2:N/A] [3:N/A] Wound Number: 4 5 N/A Photos: No Photos No Photos N/A Left Metatarsal head fourth Left Calcaneus N/A Wound Location: Gradually Appeared Gradually Appeared N/A Wounding Event: Diabetic Wound/Ulcer of the Lower Diabetic Wound/Ulcer of the Lower N/A Primary Etiology: Extremity Extremity Hypertension, Type II Diabetes, Hypertension, Type II Diabetes, N/A Comorbid History: Neuropathy Neuropathy 08/17/2019 09/17/2019 N/A Date Acquired: 3 3 N/A Weeks of Treatment: Open Open N/A Wound Status: 1.5x1.4x0.2 2x2.4x1.6 N/A Measurements L x W x D (cm) 1.649 3.77 N/A A (cm) : rea 0.33 6.032 N/A Volume (cm) : -16.60% -28.00% N/A % Reduction in  A rea: -134.00% -192.50% N/A % Reduction in Volume: 12 Starting Position 1 (o'clock): 12 Ending Position 1 (o'clock): 2.5 Maximum Distance 1 (cm): No Yes N/A Undermining: Grade 3 Grade 3 N/A Classification: Medium Medium N/A Exudate A mount: Serosanguineous Serosanguineous N/A Exudate Type: red, brown red, brown N/A Exudate Color: Distinct, outline attached Thickened N/A Wound Margin: Large (67-100%) Small (1-33%) N/A Granulation Amount: Red, Pink Red, Pink N/A Granulation Quality: Small (1-33%) Large (67-100%) N/A Necrotic Amount: Fat Layer (Subcutaneous Tissue): Yes Fat Layer (Subcutaneous Tissue): Yes N/A Exposed Structures: Fascia: No Bone: Yes Tendon: No Fascia: No Muscle: No Tendon: No Joint: No Muscle: No Bone: No Joint: No Small (1-33%) None N/A Epithelialization: Debridement - Excisional N/A N/A Debridement: Pre-procedure Verification/Time Out 09:05 N/A N/A Taken: Other N/A N/A Pain Control: Callus, Subcutaneous, Slough N/A N/A Tissue Debrided: Skin/Subcutaneous Tissue N/A N/A Level: 2.1 N/A N/A Debridement A (sq cm): rea Curette N/A N/A Instrument: Minimum N/A N/A Bleeding: Silver Nitrate N/A N/A Hemostasis A chieved: 0 N/A N/A Procedural Pain: 0 N/A N/A Post Procedural Pain: Procedure  was tolerated well N/A N/A Debridement Treatment Response: 1.5x1.4x0.2 N/A N/A Post Debridement Measurements L x W x D (cm) 0.33 N/A N/A Post Debridement Volume: (cm) Debridement N/A N/A Procedures Performed: Treatment Notes Electronic Signature(s) Signed: 12/15/2019 4:23:42 PM By: Zenaida Deed RN, BSN Signed: 12/18/2019 2:10:44 PM By: Baltazar Najjar MD Entered By: Baltazar Najjar on 12/15/2019 09:16:40 -------------------------------------------------------------------------------- Multi-Disciplinary Care Plan Details Patient Name: Date of Service: Chad Blalock MS, Barkley Boards MA S W. 12/15/2019 8:30 A M Medical Record Number: [id number] Patient  Account Number: [id number] Date of Birth/Sex: Treating RN: [birth year] (69 y.o. Damaris Schooner Primary Care Demitrious Mccannon: Kandis Fantasia, Theron Arista Other Clinician: Referring Kaid Seeberger: Treating Roseann Kees/Extender: Linton Rump in Treatment: 3 Active Inactive Nutrition Nursing Diagnoses: Impaired glucose control: actual or potential Goals: Patient/caregiver verbalizes understanding of need to maintain therapeutic glucose control per primary care physician Date Initiated: 11/24/2019 Target Resolution Date: 12/22/2019 Goal Status: Active Interventions: Provide education on elevated blood sugars and impact on wound healing Notes: Soft Tissue Infection Nursing Diagnoses: Impaired tissue integrity Knowledge deficit related to home infection control: handwashing, handling of soiled dressings, supply storage Potential for infection: soft tissue Goals: Patient's soft tissue infection will resolve Date Initiated: 12/08/2019 Target Resolution Date: 01/05/2020 Goal Status: Active Interventions: Assess signs and symptoms of infection every visit Provide education on infection Screen for HBO Treatment Activities: Consult for HBO : 12/08/2019 Culture and sensitivity : 12/08/2019 Education provided on Infection : 12/08/2019 Systemic antibiotics : 12/08/2019 Notes: Wound/Skin Impairment Nursing Diagnoses: Knowledge deficit related to ulceration/compromised skin integrity Goals: Ulcer/skin breakdown will have a volume reduction of 50% by week 8 Date Initiated: 11/24/2019 Target Resolution Date: 12/22/2019 Goal Status: Active Interventions: Provide education on ulcer and skin care Notes: Electronic Signature(s) Signed: 12/15/2019 4:23:42 PM By: Zenaida Deed RN, BSN Entered By: Zenaida Deed on 12/15/2019 09:03:08 -------------------------------------------------------------------------------- Pain Assessment Details Patient Name: Date of Service: Chad Scott MA S  W. 12/15/2019 8:30 A M Medical Record Number: [id number] Patient Account Number: [id number] Date of Birth/Sex: Treating RN: [birth year] (69 y.o. Tammy Sours Primary Care [place] Severs: Alanda Amass Other Clinician: Referring Carlen Fils: Treating Shakisha Abend/Extender: Janifer Adie Weeks in Treatment: 3 Active Problems Location of Pain Severity and Description of Pain Patient Has Paino No Site Locations Rate the pain. Rate the pain. Current Pain Level: 0 Pain Management and Medication Current Pain Management: Medication: No Cold Application: No Rest: No Massage: No Activity: No T.E.N.S.: No Heat Application: No Leg drop or elevation: No Is the Current Pain Management Adequate: Adequate How does your wound impact your activities of daily livingo Sleep: No Bathing: No Appetite: No Relationship With Others: No Bladder Continence: No Emotions: No Bowel Continence: No Work: No Toileting: No Drive: No Dressing: No Hobbies: No Electronic Signature(s) Signed: 12/15/2019 4:26:07 PM By: Shawn Stall Entered By: Shawn Stall on 12/15/2019 08:29:57 -------------------------------------------------------------------------------- Patient/Caregiver Education Details Patient Name: Date of Service: Verlon Setting 10/29/2021andnbsp8:30 A M Medical Record Number: [id number] Patient Account Number: [id number] Date of Birth/Gender: Treating RN: 1950/08/28 (69 y.o. Damaris Schooner Primary Care Physician: Kandis Fantasia, Theron Arista Other Clinician: Referring Physician: Treating Physician/Extender: Linton Rump in Treatment: 3 Education Assessment Education Provided To: Patient Education Topics Provided Elevated Blood Sugar/ Impact on Healing: Methods: Explain/Verbal Responses: Reinforcements needed, State content correctly Infection: Methods: Explain/Verbal Responses: Reinforcements needed, State content correctly Wound/Skin  Impairment: Methods: Explain/Verbal Responses: Reinforcements needed, State content correctly Electronic Signature(s) Signed: 12/15/2019 4:23:42 PM By: Zenaida Deed RN,  BSN Entered By: Zenaida DeedBoehlein, Linda on 12/15/2019 09:03:36 -------------------------------------------------------------------------------- Wound Assessment Details Patient Name: Date of Service: Chad ScottWILLIA MS, THO [place] S W. 12/15/2019 8:30 A M Medical Record Number: [id number] Patient Account Number: [id number] Date of Birth/Sex: Treating RN: [birth year] (69 y.o. Tammy SoursM) Deaton, Bobbi Primary Care Carleton [place]: Kandis FantasiaFRA NK, Theron AristaPETER Other Clinician: Referring Jennavie Martinek: Treating Karam Dunson/Extender: Mikey Collegeobson, Michael FRA NK, PETER Weeks in Treatment: 3 Wound Status Wound Number: 1 Primary Etiology: Diabetic Wound/Ulcer of the Lower Extremity Wound Location: Left, Dorsal Foot Wound Status: Healed - Epithelialized Wounding Event: Gradually Appeared Date Acquired: 08/17/2019 Weeks Of Treatment: 3 Clustered Wound: No Wound Measurements Length: (cm) Width: (cm) Depth: (cm) Area: (cm) Volume: (cm) 0 % Reduction in Area: 100% 0 % Reduction in Volume: 100% 0 0 0 Wound Description Classification: Grade 3 Electronic Signature(s) Signed: 12/15/2019 4:26:07 PM By: Shawn Stalleaton, Bobbi Entered By: Shawn Stalleaton, Bobbi on 12/15/2019 08:30:29 -------------------------------------------------------------------------------- Wound Assessment Details Patient Name: Date of Service: Chad ScottWILLIA MS, THO MA S W. 12/15/2019 8:30 A M Medical Record Number: [id number] Patient Account Number: [id number] Date of Birth/Sex: Treating RN: [birth year] (69 y.o. 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 Location: Left Amputation Site - Toe Wound  Status: Open Wounding Event: Gradually Appeared Comorbid History: Hypertension, Type II Diabetes, Neuropathy Date Acquired: 01/17/2019 Weeks Of Treatment: 3 Clustered Wound: No Wound Measurements Length: (cm) 2.9 Width: (cm) 0.6 Depth: (cm) 0.1 Area: (cm) 1.367 Volume: (cm) 0.137 % Reduction in Area: 90.3% % Reduction in Volume: 90.3% Epithelialization: Large (67-100%) Tunneling: No Undermining: No Wound Description Classification: Grade 3 Wound Margin: Distinct, outline attached Exudate Amount: Medium Exudate Type: Serosanguineous Exudate Color: red, brown Foul Odor After Cleansing: No Slough/Fibrino Yes Wound Bed Granulation Amount: Large (67-100%) Exposed Structure Granulation Quality: Red, Pink Fascia Exposed: No Necrotic Amount: Small (1-33%) Fat Layer (Subcutaneous Tissue) Exposed: Yes Necrotic Quality: Adherent Slough Tendon Exposed: No Muscle Exposed: No Joint Exposed: No Bone Exposed: No Treatment Notes Wound #2 (Left Amputation Site - Toe) 1. Cleanse With Wound Cleanser Soap and water 2. Periwound Care Barrier cream Moisturizing lotion TCA Cream 3. Primary Dressing Applied Hydrofera Blue 4. Secondary Dressing Dry Gauze 6. Support Layer Applied 3 layer compression wrap Notes stockinette. Electronic Signature(s) Signed: 12/15/2019 4:26:07 PM By: Shawn Stalleaton, Bobbi Entered By: Shawn Stalleaton, Bobbi on 12/15/2019 08:34:44 -------------------------------------------------------------------------------- Wound Assessment Details Patient Name: Date of Service: Chad ScottWILLIA MS, THO MA S W. 12/15/2019 8:30 A M Medical Record Number: [id number] Patient Account Number: [id number] Date of Birth/Sex: Treating RN: [birth year] (69 y.o. Tammy SoursM) Deaton, Bobbi Primary Care Omie Ferger: Kandis FantasiaFRA NK, Theron AristaPETER Other Clinician: Referring Bay Jarquin: Treating Aaminah Forrester/Extender: Mikey Collegeobson, Michael FRA NK, PETER Weeks in Treatment: 3 Wound Status Wound Number: 3 Primary Etiology: Diabetic Wound/Ulcer of  the Lower Extremity Wound Location: Left T Fourth oe Wound Status: Open Wounding Event: Gradually Appeared Comorbid History: Hypertension, Type II Diabetes, Neuropathy Date Acquired: 09/17/2019 Weeks Of Treatment: 3 Clustered Wound: No Wound Measurements Length: (cm) 0.4 Width: (cm) 0.9 Depth: (cm) 0.3 Area: (cm) 0.283 Volume: (cm) 0.085 % Reduction in Area: 94% % Reduction in Volume: 82% Epithelialization: Small (1-33%) Tunneling: No Undermining: Yes Starting Position (o'clock): 5 Ending Position (o'clock): 6 Maximum Distance: (cm) 0.3 Wound Description Classification: Grade 3 Wound Margin: Distinct, outline attached Exudate Amount: Medium Exudate Type: Serosanguineous Exudate Color: red, brown Foul Odor After Cleansing: No Slough/Fibrino Yes Wound Bed Granulation  Amount: Medium (34-66%) Exposed Structure Granulation Quality: Red, Pink Fascia Exposed: No Necrotic Amount: Medium (34-66%) Fat Layer (Subcutaneous Tissue) Exposed: Yes Necrotic Quality: Adherent Slough Tendon Exposed: No Muscle Exposed: No Joint Exposed: No Bone Exposed: No Treatment Notes Wound #3 (Left Toe Fourth) 1. Cleanse With Wound Cleanser Soap and water 2. Periwound Care Barrier cream Moisturizing lotion TCA Cream 3. Primary Dressing Applied Hydrofera Blue 4. Secondary Dressing Dry Gauze 6. Support Layer Applied 3 layer compression wrap Notes stockinette. Electronic Signature(s) Signed: 12/15/2019 4:26:07 PM By: Shawn Stall Entered By: Shawn Stall on 12/15/2019 08:31:37 -------------------------------------------------------------------------------- Wound Assessment Details Patient Name: Date of Service: Chad Scott MA S W. 12/15/2019 8:30 A M Medical Record Number: [id number] Patient Account Number: [id number] Date of Birth/Sex: Treating RN: [birth year] (69 y.o. Tammy Sours Primary Care Preethi Scantlebury: Kandis Fantasia, Theron Arista Other Clinician: Referring Maddi Collar: Treating  Isaiha Asare/Extender: Mikey College NK, PETER Weeks in Treatment: 3 Wound Status Wound Number: 4 Primary Etiology: Diabetic Wound/Ulcer of the Lower Extremity Wound Location: Left Metatarsal head fourth Wound Status: Open Wounding Event: Gradually Appeared Comorbid History: Hypertension, Type II Diabetes, Neuropathy Date Acquired: 08/17/2019 Weeks Of Treatment: 3 Clustered Wound: No Wound Measurements Length: (cm) 1.5 Width: (cm) 1.4 Depth: (cm) 0.2 Area: (cm) 1.649 Volume: (cm) 0.33 % Reduction in Area: -16.6% % Reduction in Volume: -134% Epithelialization: Small (1-33%) Tunneling: No Undermining: No Wound Description Classification: Grade 3 Wound Margin: Distinct, outline attached Exudate Amount: Medium Exudate Type: Serosanguineous Exudate Color: red, brown Foul Odor After Cleansing: No Slough/Fibrino Yes Wound Bed Granulation Amount: Large (67-100%) Exposed Structure Granulation Quality: Red, Pink Fascia Exposed: No Necrotic Amount: Small (1-33%) Fat Layer (Subcutaneous Tissue) Exposed: Yes Necrotic Quality: Adherent Slough Tendon Exposed: No Muscle Exposed: No Joint Exposed: No Bone Exposed: No Treatment Notes Wound #4 (Left Metatarsal head fourth) 1. Cleanse With Wound Cleanser Soap and water 2. Periwound Care Barrier cream Moisturizing lotion Skin Prep 3. Primary Dressing Applied Iodoflex 4. Secondary Dressing Dry Gauze 6. Support Layer Applied 3 layer compression wrap Notes stockinette. Electronic Signature(s) Signed: 12/15/2019 4:26:07 PM By: Shawn Stall Entered By: Shawn Stall on 12/15/2019 08:32:29 -------------------------------------------------------------------------------- Wound Assessment Details Patient Name: Date of Service: Chad Scott MA S W. 12/15/2019 8:30 A M Medical Record Number: [id number] Patient Account Number: [id number] Date of Birth/Sex: Treating RN: [birth year] (69 y.o. Tammy Sours Primary Care  Iana Buzan: Kandis Fantasia, Theron Arista Other Clinician: Referring Chadric Kimberley: Treating Igor Bishop/Extender: Mikey College NK, PETER Weeks in Treatment: 3 Wound Status Wound Number: 5 Primary Etiology: Diabetic Wound/Ulcer of the Lower Extremity Wound Location: Left Calcaneus Wound Status: Open Wounding Event: Gradually Appeared Comorbid History: Hypertension, Type II Diabetes, Neuropathy Date Acquired: 09/17/2019 Weeks Of Treatment: 3 Clustered Wound: No Wound Measurements Length: (cm) 2 Width: (cm) 2.4 Depth: (cm) 1.6 Area: (cm) 3.77 Volume: (cm) 6.032 % Reduction in Area: -28% % Reduction in Volume: -192.5% Epithelialization: None Tunneling: No Undermining: Yes Starting Position (o'clock): 12 Ending Position (o'clock): 12 Maximum Distance: (cm) 2.5 Wound Description Classification: Grade 3 Wound Margin: Thickened Exudate Amount: Medium Exudate Type: Serosanguineous Exudate Color: red, brown Foul Odor After Cleansing: No Slough/Fibrino Yes Wound Bed Granulation Amount: Small (1-33%) Exposed Structure Granulation Quality: Red, Pink Fascia Exposed: No Necrotic Amount: Large (67-100%) Fat Layer (Subcutaneous Tissue) Exposed: Yes Necrotic Quality: Adherent Slough Tendon Exposed: No Muscle Exposed: No Joint Exposed: No Bone Exposed: Yes Treatment Notes Wound #5 (Left Calcaneus) 1. Cleanse With Wound Cleanser Soap and water 2. Periwound Care Barrier cream Moisturizing lotion TCA Cream  3. Primary Dressing Applied Calcium Alginate Ag 4. Secondary Dressing Dry Gauze Heel Cup 6. Support Layer Applied 3 layer compression wrap Notes stockinette. Electronic Signature(s) Signed: 12/15/2019 4:26:07 PM By: Shawn Stall Entered By: Shawn Stall on 12/15/2019 08:33:29 -------------------------------------------------------------------------------- Vitals Details Patient Name: Date of Service: Chad Blalock MS, THO MA S W. 12/15/2019 8:30 A M Medical Record Number:  [id number] Patient Account Number: [id number] Date of Birth/Sex: Treating RN: [birth year] (69 y.o. Tammy Sours Primary Care Rodnesha Elie: Kandis Fantasia, Theron Arista Other Clinician: Referring Tradarius Reinwald: Treating Ilyaas Musto/Extender: Mikey College NK, PETER Weeks in Treatment: 3 Vital Signs Time Taken: 08:21 Temperature (F): 99.3 Height (in): 77 Pulse (bpm): 98 Weight (lbs): 210 Respiratory Rate (breaths/min): 18 Body Mass Index (BMI): 24.9 Blood Pressure (mmHg): 164/88 Reference Range: 80 - 120 mg / dl Electronic Signature(s) Signed: 12/15/2019 4:26:07 PM By: Shawn Stall Entered By: Shawn Stall on 12/15/2019 08:29:47

## 2019-12-19 ENCOUNTER — Other Ambulatory Visit: Payer: Self-pay

## 2019-12-19 ENCOUNTER — Encounter (HOSPITAL_BASED_OUTPATIENT_CLINIC_OR_DEPARTMENT_OTHER): Payer: Medicare Other | Attending: Internal Medicine | Admitting: Internal Medicine

## 2019-12-19 DIAGNOSIS — M86172 Other acute osteomyelitis, left ankle and foot: Secondary | ICD-10-CM | POA: Insufficient documentation

## 2019-12-19 DIAGNOSIS — I87332 Chronic venous hypertension (idiopathic) with ulcer and inflammation of left lower extremity: Secondary | ICD-10-CM | POA: Insufficient documentation

## 2019-12-19 DIAGNOSIS — I872 Venous insufficiency (chronic) (peripheral): Secondary | ICD-10-CM | POA: Diagnosis not present

## 2019-12-19 DIAGNOSIS — L97528 Non-pressure chronic ulcer of other part of left foot with other specified severity: Secondary | ICD-10-CM | POA: Insufficient documentation

## 2019-12-19 DIAGNOSIS — E1169 Type 2 diabetes mellitus with other specified complication: Secondary | ICD-10-CM | POA: Insufficient documentation

## 2019-12-19 DIAGNOSIS — I890 Lymphedema, not elsewhere classified: Secondary | ICD-10-CM | POA: Insufficient documentation

## 2019-12-19 DIAGNOSIS — L97424 Non-pressure chronic ulcer of left heel and midfoot with necrosis of bone: Secondary | ICD-10-CM | POA: Insufficient documentation

## 2019-12-19 DIAGNOSIS — X58XXXD Exposure to other specified factors, subsequent encounter: Secondary | ICD-10-CM | POA: Diagnosis not present

## 2019-12-19 DIAGNOSIS — E11621 Type 2 diabetes mellitus with foot ulcer: Secondary | ICD-10-CM | POA: Diagnosis present

## 2019-12-19 DIAGNOSIS — E1142 Type 2 diabetes mellitus with diabetic polyneuropathy: Secondary | ICD-10-CM | POA: Insufficient documentation

## 2019-12-19 DIAGNOSIS — T8131XD Disruption of external operation (surgical) wound, not elsewhere classified, subsequent encounter: Secondary | ICD-10-CM | POA: Diagnosis not present

## 2019-12-19 DIAGNOSIS — H547 Unspecified visual loss: Secondary | ICD-10-CM | POA: Diagnosis not present

## 2019-12-19 NOTE — Progress Notes (Signed)
[name], [name] ([id number]) Visit Report for 12/19/2019 SuperBill Details Patient Name: Date of Service: Chad Hall [place]. 12/19/2019 Medical Record Number: [id number] Patient Account Number: [id number] Date of Birth/Sex: Treating RN: 1950-05-19 (69 y.o. 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

## 2019-12-19 NOTE — Progress Notes (Signed)
KAY, RICCIUTI ([id number]) Visit Report for 12/19/2019 Arrival Information Details Patient Name: Date of Service: Jerelyn Scott [place]. 12/19/2019 7:30 A M Medical Record Number: [id number] Patient Account Number: [id number] Date of Birth/Sex: Treating RN: 1950-10-10 (69 y.o. Harlon Flor, Millard.Loa Primary Care Malayshia All: Kandis Fantasia, Theron Arista Other Clinician: Referring Skyy Nilan: Treating Tyresha Fede/Extender: Linton Rump in Treatment: 3 Visit Information History Since Last Visit Added or deleted any medications: No Patient Arrived: Cane Any new allergies or adverse reactions: No Arrival Time: 07:50 Had a fall or experienced change in No Accompanied By: self activities of daily living that may affect Transfer Assistance: None risk of falls: Patient Identification Verified: Yes Signs or symptoms of abuse/neglect since No Secondary Verification Process Completed: Yes last visito Patient Requires Transmission-Based Precautions: No Hospitalized since last visit: No Patient Has Alerts: No Implantable device outside of the clinic No excluding cellular tissue based products placed in the center since last visit: Has Dressing in Place as Prescribed: Yes Has Compression in Place as Prescribed: Yes Has Footwear/Offloading in Place as Yes Prescribed: Left: Surgical Shoe with Pressure Relief Insole Pain Present Now: No Electronic Signature(s) Signed: 12/19/2019 4:26:11 PM By: Shawn Stall Entered By: Shawn Stall on 12/19/2019 08:46:47 -------------------------------------------------------------------------------- Compression Therapy Details Patient Name: Date of Service: Jerelyn Scott MA S W. 12/19/2019 7:30 A M Medical Record Number: [id number] Patient Account Number: [id number] Date of Birth/Sex: Treating RN: Sep 06, 1950 (69 y.o. Tammy Sours Primary Care Melissa Pulido: Alanda Amass Other Clinician: Referring Kyleah Pensabene: Treating Rufus Beske/Extender: Mikey College  NK, PETER Weeks in Treatment: 3 Compression Therapy Performed for Wound Assessment: Wound #5 Left Calcaneus Performed By: Clinician Shawn Stall, RN Compression Type: Three Layer Pre Treatment ABI: 1 Electronic Signature(s) Signed: 12/19/2019 4:26:11 PM By: Shawn Stall Entered By: Shawn Stall on 12/19/2019 08:49:01 -------------------------------------------------------------------------------- Encounter Discharge Information Details Patient Name: Date of Service: [name], Barkley Boards MA S W. 12/19/2019 7:30 A M Medical Record Number: [id number] Patient Account Number: [id number] Date of Birth/Sex: Treating RN: 04/17/1950 (69 y.o. Tammy Sours Primary Care Tyarra Nolton: Alanda Amass Other Clinician: Referring Nixie Laube: Treating Cosby Proby/Extender: Linton Rump in Treatment: 3 Encounter Discharge Information Items Discharge Condition: Stable Ambulatory Status: Cane Discharge Destination: Home Transportation: Private Auto Accompanied By: self Schedule Follow-up Appointment: Yes Clinical Summary of Care: Electronic Signature(s) Signed: 12/19/2019 4:26:11 PM By: Shawn Stall Entered By: Shawn Stall on 12/19/2019 08:50:30 -------------------------------------------------------------------------------- Patient/Caregiver Education Details Patient Name: Date of Service: Verlon Setting 11/2/2021andnbsp7:30 A M Medical Record Number: [id number] Patient Account Number: [id number] Date of Birth/Gender: Treating RN: Sep 28, 1950 (69 y.o. Tammy Sours Primary Care Physician: Alanda Amass Other Clinician: Referring Physician: Treating Physician/Extender: Linton Rump in Treatment: 3 Education Assessment Education Provided To: Patient Education Topics Provided Elevated Blood Sugar/ Impact on Healing: Handouts: Elevated Blood Sugars: How Do They Affect Wound Healing Methods: Explain/Verbal Responses: Reinforcements  needed Electronic Signature(s) Signed: 12/19/2019 4:26:11 PM By: Shawn Stall Entered By: Shawn Stall on 12/19/2019 08:50:20 -------------------------------------------------------------------------------- Wound Assessment Details Patient Name: Date of Service: Jerelyn Scott MA S W. 12/19/2019 7:30 A M Medical Record Number: [id number] Patient Account Number: [id number] Date of Birth/Sex: Treating RN: 1951/01/26 (69 y.o. Tammy Sours Primary Care Zuriah Bordas: Alanda Amass Other Clinician: Referring Noris Kulinski: Treating Carmon Brigandi/Extender: Mikey College NK, PETER Weeks in Treatment: 3 Wound Status Wound Number: 2 Primary Etiology: Diabetic Wound/Ulcer of the Lower Extremity Wound Location: Left Amputation Site -  Toe Wound Status: Open Wounding Event: Gradually Appeared Date Acquired: 01/17/2019 Weeks Of Treatment: 3 Clustered Wound: No Wound Measurements Length: (cm) 2.9 Width: (cm) 0.6 Depth: (cm) 0.1 Area: (cm) 1.367 Volume: (cm) 0.137 % Reduction in Area: 90.3% % Reduction in Volume: 90.3% Wound Description Classification: Grade 3 Treatment Notes Wound #2 (Left Amputation Site - Toe) 1. Cleanse With Wound Cleanser Soap and water 2. Periwound Care Barrier cream Moisturizing lotion TCA Cream 3. Primary Dressing Applied Hydrofera Blue 4. Secondary Dressing Dry Gauze Notes stockinette. Electronic Signature(s) Signed: 12/19/2019 4:26:11 PM By: Shawn Stall Entered By: Shawn Stall on 12/19/2019 08:47:20 -------------------------------------------------------------------------------- Wound Assessment Details Patient Name: Date of Service: Jerelyn Scott MA S W. 12/19/2019 7:30 A M Medical Record Number: [id number] Patient Account Number: [id number] Date of Birth/Sex: Treating RN: [birth year] (69 y.o. Tammy Sours Primary Care Leaf [employer]: Kandis Fantasia, Theron Arista Other Clinician: Referring Read Bonelli: Treating Chauntel Windsor/Extender: Mikey College NK, PETER Weeks  in Treatment: 3 Wound Status Wound Number: 3 Primary Etiology: Diabetic Wound/Ulcer of the Lower Extremity Wound Location: Left T Fourth oe Wound Status: Open Wounding Event: Gradually Appeared Date Acquired: 09/17/2019 Weeks Of Treatment: 3 Clustered Wound: No Wound Measurements Length: (cm) 0.4 Width: (cm) 0.9 Depth: (cm) 0.3 Area: (cm) 0.283 Volume: (cm) 0.085 % Reduction in Area: 94% % Reduction in Volume: 82% Wound Description Classification: Grade 3 Treatment Notes Wound #3 (Left Toe Fourth) 1. Cleanse With Wound Cleanser Soap and water 2. Periwound Care Barrier cream Moisturizing lotion TCA Cream 3. Primary Dressing Applied Hydrofera Blue 4. Secondary Dressing Dry Gauze Notes stockinette. Electronic Signature(s) Signed: 12/19/2019 4:26:11 PM By: Shawn Stall Entered By: Shawn Stall on 12/19/2019 08:47:20 -------------------------------------------------------------------------------- Wound Assessment Details Patient Name: Date of Service: Jerelyn Scott MA S W. 12/19/2019 7:30 A M Medical Record Number: [id number] Patient Account Number: [id number] Date of Birth/Sex: Treating RN: 1950-08-14 (69 y.o. Harlon Flor, Millard.Loa Primary Care Elisha Mcgruder: Kandis Fantasia, Theron Arista Other Clinician: Referring Taqwa Deem: Treating Dyann Goodspeed/Extender: Mikey [employer] NK, PETER Weeks in Treatment: 3 Wound Status Wound Number: 4 Primary Etiology: Diabetic Wound/Ulcer of the Lower Extremity Wound Location: Left Metatarsal head fourth Wound Status: Open Wounding Event: Gradually Appeared Date Acquired: 08/17/2019 Weeks Of Treatment: 3 Clustered Wound: No Wound Measurements Length: (cm) 1.5 Width: (cm) 1.4 Depth: (cm) 0.2 Area: (cm) 1.649 Volume: (cm) 0.33 % Reduction in Area: -16.6% % Reduction in Volume: -134% Wound Description Classification: Grade 3 Treatment Notes Wound #4 (Left Metatarsal head fourth) 1. Cleanse With Wound Cleanser Soap and water 2. Periwound  Care Barrier cream Moisturizing lotion Skin Prep 3. Primary Dressing Applied Iodoflex 4. Secondary Dressing Dry Gauze 6. Support Layer Applied 3 layer compression wrap Notes stockinette. Electronic Signature(s) Signed: 12/19/2019 4:26:11 PM By: Shawn Stall Entered By: Shawn Stall on 12/19/2019 08:47:20 -------------------------------------------------------------------------------- Wound Assessment Details Patient Name: Date of Service: Jerelyn Scott MA S W. 12/19/2019 7:30 A M Medical Record Number: [id number] Patient Account Number: [id number] Date of Birth/Sex: Treating RN: 16-Aug-1950 (69 y.o. Tammy Sours Primary Care Armetta Henri: Kandis Fantasia, Theron Arista Other Clinician: Referring Zyra Parrillo: Treating General Wearing/Extender: Mikey College NK, PETER Weeks in Treatment: 3 Wound Status Wound Number: 5 Primary Etiology: Diabetic Wound/Ulcer of the Lower Extremity Wound Location: Left Calcaneus Wound Status: Open Wounding Event: Gradually Appeared Date Acquired: 09/17/2019 Weeks Of Treatment: 3 Clustered Wound: No Wound Measurements Length: (cm) 2 Width: (cm) 2.4 Depth: (cm) 1.6 Area: (cm) 3.77 Volume: (cm) 6.032 % Reduction in Area: -28% % Reduction in Volume: -192.5% Wound Description Classification: Grade 3  Treatment Notes Wound #5 (Left Calcaneus) 1. Cleanse With Wound Cleanser Soap and water 2. Periwound Care Barrier cream Moisturizing lotion TCA Cream 3. Primary Dressing Applied Calcium Alginate Ag 4. Secondary Dressing Dry Gauze Heel Cup 6. Support Layer Applied 3 layer compression wrap Notes stockinette. Electronic Signature(s) Signed: 12/19/2019 4:26:11 PM By: Shawn Stall Entered By: Shawn Stall on 12/19/2019 08:47:21 -------------------------------------------------------------------------------- Vitals Details Patient Name: Date of Service: [name], THO MA S W. 12/19/2019 7:30 A M Medical Record Number: [id number] Patient Account Number:  [id number] Date of Birth/Sex: Treating RN: 07-31-1950 (69 y.o. Tammy Sours Primary Care Coleen Cardiff: Kandis Fantasia, Theron Arista Other Clinician: Referring Doyel Mulkern: Treating Lazette Estala/Extender: Mikey College NK, PETER Weeks in Treatment: 3 Vital Signs Time Taken: 07:50 Temperature (F): 99 Height (in): 77 Pulse (bpm): 96 Weight (lbs): 210 Respiratory Rate (breaths/min): 20 Body Mass Index (BMI): 24.9 Blood Pressure (mmHg): 175/95 Reference Range: 80 - 120 mg / dl Electronic Signature(s) Signed: 12/19/2019 4:26:11 PM By: Shawn Stall Entered By: Shawn Stall on 12/19/2019 08:47:02

## 2019-12-21 ENCOUNTER — Ambulatory Visit: Payer: Medicare Other | Admitting: Internal Medicine

## 2019-12-21 ENCOUNTER — Other Ambulatory Visit: Payer: Self-pay

## 2019-12-21 ENCOUNTER — Encounter: Payer: Self-pay | Admitting: Internal Medicine

## 2019-12-21 VITALS — BP 161/81 | HR 81 | Temp 98.3°F | Wt 200.0 lb

## 2019-12-21 DIAGNOSIS — M8660 Other chronic osteomyelitis, unspecified site: Secondary | ICD-10-CM | POA: Diagnosis not present

## 2019-12-21 DIAGNOSIS — L089 Local infection of the skin and subcutaneous tissue, unspecified: Secondary | ICD-10-CM

## 2019-12-21 DIAGNOSIS — E11628 Type 2 diabetes mellitus with other skin complications: Secondary | ICD-10-CM | POA: Diagnosis not present

## 2019-12-21 MED ORDER — AMOXICILLIN-POT CLAVULANATE 875-125 MG PO TABS: 1.0000 | ORAL_TABLET | Freq: Two times a day (BID) | ORAL | 1 refills | Status: AC

## 2019-12-21 NOTE — Patient Instructions (Addendum)
Thank you for using our service   At this time, I agree that you might have bone infection of the left heel. The culture did grow bacteria   Finish your 2 weeks of augmentin, then continue for another 4 weeks. We might need even up to 10 week abx  Continue aggressive wound care, diabetes control, and off loading your pressure on the left foot  Continue to see with wound clinic as scheduled   I will see you again in 4-6 weeks You have labs today to do  Thank you

## 2019-12-21 NOTE — Progress Notes (Signed)
[facility] for Infectious Disease  Reason for Consult:diabetic foot ulcer/infection Referring Provider: Linton Ham     Patient Active Problem List   Diagnosis Date Noted  . Maggot infestation 08/23/2019  . Pyogenic inflammation of bone ([place])   . Osteomyelitis of foot, left, acute ([place]) 12/26/2018  . Right knee pain 10/04/2017  . Left hip pain 04/10/2013  . Diabetic peripheral neuropathy associated with type 2 diabetes mellitus ([place]) 11/10/2012  . Marfan's syndrome 08/08/2012  . Healthcare maintenance 08/08/2012  . Atopic dermatitis 02/16/2012  . Overweight (BMI 25.0-29.9) 07/05/2011  . Allergic rhinitis due to allergen 07/03/2011  . Venous insufficiency of leg 04/03/2011  . Blindness 07/23/2010  . DM (diabetes mellitus), type 2 with neurological complications ([place]) 44/04/4740  . Hypertension 06/13/2010  . Hyperlipidemia 06/13/2010    Patient's Medications  New Prescriptions   No medications on file  Previous Medications   ACETAMINOPHEN (TYLENOL) 325 MG TABLET    Take 2 tablets (650 mg total) by mouth every 6 (six) hours as needed for mild pain (or Fever >/= 101).   AMOXICILLIN-CLAVULANATE (AUGMENTIN) 875-125 MG TABLET    Take 1 tablet by mouth 2 (two) times daily.   ATORVASTATIN (LIPITOR) 80 MG TABLET    TAKE 1 TABLET BY MOUTH  DAILY AT 6 PM   AUGMENTED BETAMETHASONE DIPROPIONATE (DIPROLENE-AF) 0.05 % CREAM    APPLY TO AFFECTED AREA(S)  TOPICALLY TWICE DAILY AS  DIRECTED   CHOLECALCIFEROL (VITAMIN D3) 25 MCG (1000 UT) TABLET    Take 1,000 Units by mouth at bedtime.   DOCUSATE SODIUM (COLACE) 100 MG CAPSULE    Take 1 capsule (100 mg total) by mouth daily as needed for mild constipation.   DOXYCYCLINE (VIBRA-TABS) 100 MG TABLET    TAKE 1 TABLET BY MOUTH  TWICE DAILY   EMPAGLIFLOZIN (JARDIANCE) 10 MG TABS TABLET    Take 1 tablet (10 mg total) by mouth daily.   ENALAPRIL (VASOTEC) 20 MG TABLET    TAKE 1 TABLET BY MOUTH  DAILY   GABAPENTIN (NEURONTIN) 100 MG  CAPSULE    TAKE 1 CAPSULE BY MOUTH  ONCE DAILY   GLIPIZIDE (GLUCOTROL) 10 MG TABLET    Take 1 tablet (10 mg total) by mouth at bedtime.   LINAGLIPTIN (TRADJENTA) 5 MG TABS TABLET    Take 1 tablet (5 mg total) by mouth at bedtime.   METOPROLOL SUCCINATE (TOPROL-XL) 25 MG 24 HR TABLET    Take 0.5 tablets (12.5 mg total) by mouth every evening.   NITROGLYCERIN (NITRODUR - DOSED IN MG/24 HR) 0.2 MG/HR PATCH    Place 1 patch (0.2 mg total) onto the skin daily. Apply to skin daily to different area of ankle and foot. Remove previous patch   ONDANSETRON (ZOFRAN) 4 MG TABLET    Take 1 tablet (4 mg total) by mouth every 8 (eight) hours as needed for nausea or vomiting.   OXYCODONE (OXY IR/ROXICODONE) 5 MG IMMEDIATE RELEASE TABLET    Take 1 tablet (5 mg total) by mouth every 6 (six) hours as needed for severe pain (pain score 6-10).   PENTOXIFYLLINE (TRENTAL) 400 MG CR TABLET    TAKE 1 TABLET BY MOUTH 3  TIMES DAILY WITH MEALS   PRODIGY NO CODING BLOOD GLUC TEST STRIP    USE TO CHECK BLOOD SUGAR  THREE TIMES DAILY OR AS  DIRECTED   PRODIGY TWIST TOP LANCETS 28G MISC    CHECK BLOOD SUGAR 3 TIMES  DAILY  TRIAMCINOLONE CREAM (KENALOG) 0.1 %    APPLY TO AFFECTED AREA(S)  TOPICALLY TWICE DAILY  Modified Medications   No medications on file  Discontinued Medications   No medications on file    HPI: Chad Hall is a 69 y.o. male marfan, dm2 with retinopathy/neuropathy, ckd3, chronic left foot ulcer referred here for left heel ulcer of 2-3 months with mri concern of osteomyelitis  Patient is being followed by ortho, podiatry, and wound clinic. He is referred here by wound care dr Dellia Nims  He had prior partial rays. He has had ulcer on the plantar/dorsum forefoot for several months. 2-3 months ago another ulcer developed over the calcaneal area. He had positive probe to the bone early 11/2019 and 10/20 mri show imaging evidence suggestive of early om. He had bedside tissue biopsy/path sent. The culture  didn't grow anything. Unfortunately it doesn't appear the pathology was done  He wasn't on antibiotics prior to the procedure  He is just given amox-clav 14 days and referred here for evaluation of osteomyelitis and treatment   Per the note he has a recent ABI on the left LE of 1.03 and Dr Sharol Given didn't think he has arterial insufficiency  His last visit on 12/08/19 at the wound clinic he has bedside debridement of the forefoot ulcer and the heel.   I reviewed his prior culture, and it doesn't appear he has mdr or mrsa  His sugar at home is 80s-130s; a1c in the 5's. He feels well otherwise  He is blind in both eyes and wants to preserve his foot  He lives with his wife  Review of Systems: ROS Negative 11 point ros unless mentioned above   Past Medical History:  Diagnosis Date  . Allergy   . Blindness - both eyes 1967   Basketball injury  . Diabetes mellitus   . Hernia   . Hyperlipidemia   . Hypertension   . Marfan syndrome     Social History   Tobacco Use  . Smoking status: Former Smoker    Quit date: 02/14/1984    Years since quitting: 35.8  . Smokeless tobacco: Never Used  Substance Use Topics  . Alcohol use: No    Comment: Quit in 10/1983  . Drug use: No    Family History  Problem Relation Age of Onset  . Heart disease Mother   . Breast cancer Sister    No Known Allergies  OBJECTIVE: Vitals:   12/21/19 1016  Weight: 200 lb (90.7 kg)   Body mass index is 23.72 kg/m.   Physical Exam Tall male, in sun glasses, no distress, pleasant, conversant Heent: atraumatic, eomi, oropharynx clear Neck supple cv rrr no mrg Lungs clear to auscultation, normal respiratory effort abd s/nt Ext no edema Skin; left foot hyperkeratosis forefoot; 3 ulcer; 1 dorsum over 2nd webspace with some fibrinous discharge, the other plantar forefoot clean based; the 3rd is a 1 inch calcaneous ulcer that apperas to track to bone but clean base and no  tenderness/swelling/fluctuance. The forefoot ulcers are small about 1 cm msk no synovitis Neuro: blind; cn2-12 intact otherwise; strenght intact  Lab: 09/2019 a1c 5.4  Microbiology: No results found for this or any previous visit (from the past 240 hour(s)). 12/08/19 left heel tissue cx (bedside deep debridement) Group b strep prrevotella  Serology:  Imaging: 10/20 mri left foot   Assessment/plan: Diabetic foot ulcers with mri imaging of OM. Positive probe to bone and tissue cx with microbes  Agree this is  likely OM. The distinction of acute and chronic is rather hard at times, but in this case would say chronic  He had bedside bone debridement it appears on 10/22. I discussed with him calcaneal OM is tricky to manage and often patients end up with bka even though ideal conditions (off loading, good dm2 control, targetted therapy) are achieved.  In terms of oral vs iv, there is no strong evidence one is superior. While betalactam oral absorption is not the best, for dm foot om, we can try this and follow. If clinically he is heading in wrong direction will repeat mri and see if further debridment/culture is needed  -continue amox-clav; plan 6-10 weeks from 10/22 -crp, cbc, cmp, esr today -f/u 4-6 weeks with repeat labs     Patient Active Problem List   Diagnosis Date Noted  . Maggot infestation 08/23/2019  . Pyogenic inflammation of bone ([place])   . Osteomyelitis of foot, left, acute ([place]) 12/26/2018  . Right knee pain 10/04/2017  . Left hip pain 04/10/2013  . Diabetic peripheral neuropathy associated with type 2 diabetes mellitus ([place]) 11/10/2012  . Marfan's syndrome 08/08/2012  . Healthcare maintenance 08/08/2012  . Atopic dermatitis 02/16/2012  . Overweight (BMI 25.0-29.9) 07/05/2011  . Allergic rhinitis due to allergen 07/03/2011  . Venous insufficiency of leg 04/03/2011  . Blindness 07/23/2010  . DM (diabetes mellitus), type 2 with neurological complications ([place])  70/96/2836  . Hypertension 06/13/2010  . Hyperlipidemia 06/13/2010     Problem List Items Addressed This Visit    None       I am having Chad Hall maintain his cholecalciferol, acetaminophen, docusate sodium, ondansetron, oxyCODONE, nitroGLYCERIN, enalapril, Prodigy Twist Top Lancets 28G, metoprolol succinate, glipiZIDE, Prodigy No Coding Blood Gluc, atorvastatin, augmented betamethasone dipropionate, triamcinolone cream, linagliptin, empagliflozin, gabapentin, doxycycline, pentoxifylline, and amoxicillin-clavulanate.   Meds ordered this encounter  Medications  . amoxicillin-clavulanate (AUGMENTIN) 875-125 MG tablet    Sig: Take 1 tablet by mouth 2 (two) times daily.    Dispense:  60 tablet    Refill:  1     Follow-up: No follow-ups on file.  Jabier Mutton, [facility] [place] -- -- pager   [phone] cell 12/21/2019, 10:20 AM

## 2019-12-22 ENCOUNTER — Encounter (HOSPITAL_BASED_OUTPATIENT_CLINIC_OR_DEPARTMENT_OTHER): Payer: Medicare Other | Admitting: Internal Medicine

## 2019-12-22 DIAGNOSIS — T8131XD Disruption of external operation (surgical) wound, not elsewhere classified, subsequent encounter: Secondary | ICD-10-CM | POA: Diagnosis not present

## 2019-12-22 LAB — COMPREHENSIVE METABOLIC PANEL
AG Ratio: 0.8 (calc) — ABNORMAL LOW (ref 1.0–2.5)
ALT: 14 U/L (ref 9–46)
AST: 15 U/L (ref 10–35)
Albumin: 3.2 g/dL — ABNORMAL LOW (ref 3.6–5.1)
Alkaline phosphatase (APISO): 91 U/L (ref 35–144)
BUN/Creatinine Ratio: 16 (calc) (ref 6–22)
BUN: 22 mg/dL (ref 7–25)
CO2: 28 mmol/L (ref 20–32)
Calcium: 9.1 mg/dL (ref 8.6–10.3)
Chloride: 107 mmol/L (ref 98–110)
Creat: 1.35 mg/dL — ABNORMAL HIGH (ref 0.70–1.18)
Globulin: 4 g/dL (calc) — ABNORMAL HIGH (ref 1.9–3.7)
Glucose, Bld: 176 mg/dL — ABNORMAL HIGH (ref 65–99)
Potassium: 4.9 mmol/L (ref 3.5–5.3)
Sodium: 142 mmol/L (ref 135–146)
Total Bilirubin: 0.3 mg/dL (ref 0.2–1.2)
Total Protein: 7.2 g/dL (ref 6.1–8.1)

## 2019-12-22 LAB — CBC WITH DIFFERENTIAL/PLATELET
Absolute Monocytes: 930 cells/uL (ref 200–950)
Basophils Absolute: 68 cells/uL (ref 0–200)
Basophils Relative: 0.9 %
Eosinophils Absolute: 338 cells/uL (ref 15–500)
Eosinophils Relative: 4.5 %
HCT: 31.5 % — ABNORMAL LOW (ref 38.5–50.0)
Hemoglobin: 9.9 g/dL — ABNORMAL LOW (ref 13.2–17.1)
Lymphs Abs: 1230 cells/uL (ref 850–3900)
MCH: 29 pg (ref 27.0–33.0)
MCHC: 31.4 g/dL — ABNORMAL LOW (ref 32.0–36.0)
MCV: 92.4 fL (ref 80.0–100.0)
MPV: 8.9 fL (ref 7.5–12.5)
Monocytes Relative: 12.4 %
Neutro Abs: 4935 cells/uL (ref 1500–7800)
Neutrophils Relative %: 65.8 %
Platelets: 421 10*3/uL — ABNORMAL HIGH (ref 140–400)
RBC: 3.41 10*6/uL — ABNORMAL LOW (ref 4.20–5.80)
RDW: 11.9 % (ref 11.0–15.0)
Total Lymphocyte: 16.4 %
WBC: 7.5 10*3/uL (ref 3.8–10.8)

## 2019-12-22 LAB — C-REACTIVE PROTEIN: CRP: 35.5 mg/L — ABNORMAL HIGH (ref <8.0)

## 2019-12-22 LAB — SEDIMENTATION RATE: Sed Rate: 108 mm/h — ABNORMAL HIGH (ref 0–20)

## 2019-12-25 NOTE — Progress Notes (Signed)
Chad Hall ([id number]) Visit Report for 12/22/2019 Debridement Details Patient Name: Date of Service: Chad Hall [place]. 12/22/2019 7:30 A M Medical Record Number: [id number] Patient Account Number: [id number] Date of Birth/Sex: Treating RN: 12-25-1950 (69 y.o. Chad Hall Primary Care Provider: Myrle Sheng, Collier Hall Other Clinician: Referring Provider: Treating Provider/Extender: Chad Hall: 4 Debridement Performed for Assessment: Wound #4 Left Metatarsal head fourth Performed By: Physician Chad Dillon., MD Debridement Type: Debridement Severity of Tissue Pre Debridement: Fat layer exposed Level of Consciousness (Pre-procedure): Awake and Alert Pre-procedure Verification/Time Out Yes - 08:10 Taken: Start Time: 08:13 Pain Control: Other : benzocaine 20% spray T Area Debrided (L x Hall): otal 1.4 (cm) x 1.7 (cm) = 2.38 (cm) Tissue and other material debrided: Viable, Non-Viable, Callus, Slough, Subcutaneous, Skin: Epidermis, Slough Level: Skin/Subcutaneous Tissue Debridement Description: Excisional Instrument: Blade, Curette, Forceps Bleeding: Minimum Hemostasis Achieved: Pressure End Time: 08:15 Procedural Pain: 0 Post Procedural Pain: 0 Response to Hall: Procedure was tolerated well Level of Consciousness (Post- Awake and Alert procedure): Post Debridement Measurements of Total Wound Length: (cm) 1.4 Width: (cm) 1.7 Depth: (cm) 0.6 Volume: (cm) 1.122 Character of Wound/Ulcer Post Debridement: Improved Severity of Tissue Post Debridement: Fat layer exposed Post Procedure Diagnosis Same as Pre-procedure Electronic Signature(s) Signed: 12/22/2019 6:06:29 PM By: Chad Gouty RN, BSN Signed: 12/25/2019 1:46:42 PM By: Chad Ham MD Entered By: Chad Hall on 12/22/2019 08:28:36 -------------------------------------------------------------------------------- Debridement Details Patient Name: Date of  Service: Chad Hall. 12/22/2019 7:30 A M Medical Record Number: [id number] Patient Account Number: [id number] Date of Birth/Sex: Treating RN: 13-Feb-1951 (69 y.o. Chad Hall Primary Care Provider: Myrle Sheng, Collier Hall Other Clinician: Referring Provider: Treating Provider/Extender: Chad Hall: 4 Debridement Performed for Assessment: Wound #5 Left Calcaneus Performed By: Physician Chad Dillon., MD Debridement Type: Debridement Severity of Tissue Pre Debridement: Bone involvement without necrosis Level of Consciousness (Pre-procedure): Awake and Alert Pre-procedure Verification/Time Out Yes - 08:10 Taken: Start Time: 08:13 Pain Control: Other : benzocaine 20% spray T Area Debrided (L x Hall): otal 2.5 (cm) x 3 (cm) = 7.5 (cm) Tissue and other material debrided: Non-Viable, Callus, Skin: Epidermis Level: Skin/Epidermis Debridement Description: Selective/Open Wound Instrument: Blade, Forceps Bleeding: None End Time: 08:15 Procedural Pain: 0 Post Procedural Pain: 0 Response to Hall: Procedure was tolerated well Level of Consciousness (Post- Awake and Alert procedure): Post Debridement Measurements of Total Wound Length: (cm) 1.5 Width: (cm) 1.8 Depth: (cm) 1 Volume: (cm) 2.121 Character of Wound/Ulcer Post Debridement: Improved Severity of Tissue Post Debridement: Bone involvement without necrosis Post Procedure Diagnosis Same as Pre-procedure Electronic Signature(s) Signed: 12/22/2019 6:06:29 PM By: Chad Gouty RN, BSN Signed: 12/25/2019 1:46:42 PM By: Chad Ham MD Entered By: Chad Hall on 12/22/2019 08:28:44 -------------------------------------------------------------------------------- HPI Details Patient Name: Date of Service: Chad Hall. 12/22/2019 7:30 A M Medical Record Number: [id number] Patient Account Number: [id number] Date of Birth/Sex: Treating RN: 01/28/1951 (69 y.o. Chad Hall Primary Care Provider: Myrle Sheng, Collier Hall Other Clinician: Referring Provider: Treating Provider/Extender: Chad Hall: 4 History of Present Illness HPI Description: ADMISSION 11/24/2019 Chad Hall is a 69 year old man with type 2 diabetes well controlled peripheral neuropathy and blindness secondary to remote bilateral retinal detachments I believe. His problem with regards to his left foot began it in November 2020. He developed osteomyelitis of his left toe/MTP and underwent a left third  ray amputation by Dr. Sharol Hall. He developed a wound dehiscence by early December and he was followed for this by Dr. Jess Hall office for most of this year. Most recently he has been using silver alginate. Trental and nitroglycerin patches have also been used. Early in September it was determined that the third grade dehiscence had closed over but for the first time Hall of a wound on the fourth toe, fourth met head and left plantar heel. Dr. Sharol Hall had talked about a transmetatarsal amputation but that certainly would not address the very significant area on his heel now and also there was some conversation about a BKA. The patient is here to see what can be done to save his foot The patient is a well-controlled type II diabetic on oral agents. He also has severe chronic venous insufficiency, Marfan's, hyperlipidemia, thoracic aortic aneurysm, blindness. 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 He was on his feet for quite a period of time but he tells me he is not working currently. His ABI in our clinic was 1.03 on the left he was not felt in Dr. Jess Hall office based on bedside Dopplers to have arterial insufficiency. This seems quite correct 10/15 this is a patient who has 3 open wounds at surgical site wound on a third ray amputation site, and area over the fourth met head and a deep area over the plantar left heel. His MRI is  booked for 10/20. Based on the MRI we will need to make a decision whether we attempt surgical or medical Hall for underlying infection if this is present versus offloading with a total contact cast. Even the total contact cast might have obstacles Hall his visual issues/balance. He lives with his wife who is also visually impaired. He takes Scat into the clinic. He also has severe chronic venous insufficiency which I think is added to the difficulties in healing of the surgical wound largely in the dorsal foot just proximal to the third toe amputation. We have been using silver alginate. I changed him to [facility] today 10/22; patient's MRI is come back suggesting subtle subcortical bone marrow edema and enhancement along the posterior and plantar surfaces of the calcaneal tuberosity either reactive osteitis or acute early osteomyelitis. The third ray amputation site did not have any obvious pathologic findings to suggest infection there was no joint effusion.. He has wound over the fourth metatarsal head there was no comment about this either. Hall the fact that the patient has a probing wound on his heel to bone I suspect he does have osteomyelitis. I am going to send him to infectious disease for antibiotic management probably IV antibiotics. I have done a bone culture and pathology today from the heel. He came in today with his leg wrapped apparently there was quite an odor we will try to get home health to change this at least once 10/29; the patient's third ray amputation dehiscence has healed as have the numerous small areas on his dorsal foot. All of this I think a complication of uncontrolled swelling from chronic venous insufficiency and lymphedema together with very dry dyshidrotic skin. The major concerns I have are wounds on his plantar heel which probes to bone. MRI suggested early osteomyelitis of this area. Bone culture I did showed group B strep and a few anaerobes. I note  today that the anaerobes were beta-lactam positive I actually put him on Augmentin which may not of been the right choice HOWEVER  the patient has an appointment with infectious disease on Tuesday so I will leave further antibiotics to them. I did also note that the pathology for the bone I sent did not show osteomyelitis although this may not have been the right bone area. In any case I think the patient needs to be treated. The patient is blind. I think he is going to need a total contact cast if he can tolerate this because of balance issues however I want to be sure that we adequately treat his infection in the heel before going this route. 12/22/2019; patient was kindly seen by Dr. Gale Journey of infectious disease. The patient is a diabetic who presented to our clinic status post third ray amputation. He was being followed at by Dr. Jess Hall office. This was a nonhealing area largely secondary to poorly controlled chronic venous insufficiency and lymphedema. Once that was controlled this has closed markedly. However he arrived in clinic with a deep probing wound to bone on the plantar heel as well as a nonhealing wound over the fourth metatarsal head. MRI suggested osteomyelitis in the heel. Although the pathology of the bone I took out of his heel was negative he grew group B strep and I have had him on Augmentin. Dr. Gale Journey ordered lab work including CMP, CBC with differential, sedimentation rate and C-reactive protein all need to follow-up on this. Made comments about a follow-up MRI but as far as I understand only continued the Augmentin pending follow-up in 5 or 6 weeks. We are using silver alginate to the heel and making some progress. Iodoflex to the fifth metatarsal head and we will use silver alginate in his amputation site, perhaps he was putting [facility] and here I am not sure how that happened. He does not have arterial insufficiency. ABI in our clinic was 1.03. He certainly does have chronic  venous insufficiency however Electronic Signature(s) Signed: 12/25/2019 1:46:42 PM By: Chad Ham MD Entered By: Chad Hall on 12/22/2019 08:32:32 -------------------------------------------------------------------------------- Physical Exam Details Patient Name: Date of Service: Gladys Damme, [name] MA S Hall. 12/22/2019 7:30 A M Medical Record Number: [id number] Patient Account Number: [id number] Date of Birth/Sex: Treating RN: Jun 27, 1950 (69 y.o. Chad Hall Primary Care Provider: Myrle Sheng, PETER Other Clinician: Referring Provider: Treating Provider/Extender: Burnis Kingfisher NK, PETER Weeks in Hall: 4 Cardiovascular Needle pulses are palpable. Skin changes of chronic venous insufficiency in the lower extremities and dorsal feet. Very dry skin as well. Notes Wound exam Third ray amputation site may be slightly open this week this was largely closed last week The deep probing area on the plantar heel is better I can no longer probe to bone there is healthier looking granulation here. I removed skin callus and subcutaneous tissue from around the margins. Fourth metatarsal head this has a deeper undermining area in the proximal part of the wound. But it does not go to bone. I removed callus skin subcutaneous tissue from the margins here and debrided the wound surface of adherent fibrinous debris hemostasis with silver Electronic Signature(s) Signed: 12/25/2019 1:46:42 PM By: Chad Ham MD Entered By: Chad Hall on 12/22/2019 08:34:04 -------------------------------------------------------------------------------- Physician Orders Details Patient Name: Date of Service: Gladys Damme, [name] MA S Hall. 12/22/2019 7:30 A M Medical Record Number: [id number] Patient Account Number: [id number] Date of Birth/Sex: Treating RN: 1950/02/23 (69 y.o. 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 Dressing Change Frequency Change dressing three times week. - all wounds (home health to change Mon and Fri) Skin Barriers/Peri-Wound Care Barrier cream - to macerated areas of foot Moisturizing lotion - to leg TCA Cream or Ointment - mixed with lotion to left leg Wound Cleansing Clean wound with Wound Cleanser May shower with protection. Primary Wound Dressing Wound #2 Left Amputation Site - Toe Calcium Alginate with Silver Wound #3 Left T Fourth oe Calcium Alginate with Silver Wound #4 Left Metatarsal head fourth Iodoflex Wound #5 Left Calcaneus lginate with Silver - packing Calcium A Secondary Dressing Wound #2 Left Amputation Site - Toe Dry Gauze Wound #3 Left T Fourth oe Dry Gauze Wound #4 Left Metatarsal head fourth Dry Gauze Wound #5 Left Calcaneus Dry Gauze Heel Cup Edema Control 3 Layer Compression System - Left Lower Extremity - stockinnette to cover toes Avoid standing for long periods of time Elevate legs to the level of the heart or above for 30 minutes daily and/or when sitting, a frequency of: Off-Loading Open toe surgical shoe to: [facility] for wound care. Jackquline [place] Electronic Signature(s) Signed: 12/22/2019 6:06:29 PM By: Chad Gouty RN, BSN Signed:  12/25/2019 1:46:42 PM By: Chad Ham MD Entered By: Chad Hall on 12/22/2019 08:31:58 -------------------------------------------------------------------------------- Problem List Details Patient Name: Date of Service: Gladys Damme, [name] MA S Hall. 12/22/2019 7:30 A M Medical Record Number: [id number] Patient Account Number: [id number] Date of Birth/Sex: Treating RN: 1950/04/27 (69 y.o. 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 11/24/2019 No Yes M86.172 Other acute osteomyelitis, left ankle and foot 12/08/2019 No Yes L97.528 Non-pressure chronic ulcer of other part of left foot with other specified 11/24/2019 No Yes severity I87.332 Chronic venous hypertension (idiopathic) with ulcer and inflammation of left 11/24/2019 No Yes lower extremity Inactive Problems Resolved Problems Electronic Signature(s) Signed: 12/25/2019 1:46:42 PM By: Chad Ham MD Entered By: Chad Hall on 12/22/2019 08:27:41 -------------------------------------------------------------------------------- Progress Note Details Patient Name: Date of Service: Chad Hall. 12/22/2019 7:30 A M Medical Record Number: [id number] Patient Account Number: [id number] Date of Birth/Sex: Treating RN: [birth year] (69 y.o. Chad Hall Primary Care Provider: Myrle Sheng, Collier Hall Other Clinician: Referring Provider: Treating Provider/Extender: Chad Hall: 4 Subjective History of Present Illness  (HPI) ADMISSION 11/24/2019 Chad Hall is a 69 year old man with type 2 diabetes well controlled peripheral neuropathy and blindness secondary to remote bilateral retinal detachments I believe. His problem with regards to his left foot began it in November 2020. He developed osteomyelitis of his left toe/MTP and underwent a left third ray amputation by Dr.  Sharol Hall. He developed a wound dehiscence by early December and he was followed for this by Dr. Jess Hall office for most of this year. Most recently he has been using silver alginate. Trental and nitroglycerin patches have also been used. Early in September it was determined that the third grade dehiscence had closed over but for the first time Hall of a wound on the fourth toe, fourth met head and left plantar heel. Dr. Sharol Hall had talked about a transmetatarsal amputation but that certainly would not address the very significant area on his heel now and also there was some conversation about a BKA. The patient is here to see what can be done to save his foot The patient is a well-controlled type II diabetic on oral agents. He also has severe chronic venous insufficiency, Marfan's, hyperlipidemia, thoracic aortic aneurysm, blindness. 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 He was on his feet for quite a period of time but he tells me he is not working currently. His ABI in our clinic was 1.03 on the left he was not felt in Dr. Jess Hall office based on bedside Dopplers to have arterial insufficiency. This seems quite correct 10/15 this is a patient who has 3 open wounds at surgical site wound on a third ray amputation site, and area over the fourth met head and a deep area over the plantar left heel. His MRI is booked for 10/20. Based on the MRI we will need to make a decision whether we attempt surgical or medical Hall for underlying infection if this is present versus offloading with a total contact cast. Even the  total contact cast might have obstacles Hall his visual issues/balance. He lives with his wife who is also visually impaired. He takes Scat into the clinic. He also has severe chronic venous insufficiency which I think is added to the difficulties in healing of the surgical wound largely in the dorsal foot just proximal to the third toe amputation. We have been using silver alginate. I changed him to [facility] today 10/22; patient's MRI is come back suggesting subtle subcortical bone marrow edema and enhancement along the posterior and plantar surfaces of the calcaneal tuberosity either reactive osteitis or acute early osteomyelitis. The third ray amputation site did not have any obvious pathologic findings to suggest infection there was no joint effusion.. He has wound over the fourth metatarsal head there was no comment about this either. Hall the fact that the patient has a probing wound on his heel to bone I suspect he does have osteomyelitis. I am going to send him to infectious disease for antibiotic management probably IV antibiotics. I have done a bone culture and pathology today from the heel. He came in today with his leg wrapped apparently there was quite an odor we will try to get home health to change this at least once 10/29; the patient's third ray amputation dehiscence has healed as have the numerous small areas on his dorsal foot. All of this I think a complication of uncontrolled swelling from chronic venous insufficiency and lymphedema together with very dry dyshidrotic skin. The major concerns I have are wounds on his plantar heel which probes to bone. MRI suggested early osteomyelitis of this area. Bone culture I did showed group B strep and a few anaerobes. I note today that the anaerobes were beta-lactam positive I actually put him on Augmentin which may not of been the right choice HOWEVER the patient has an appointment  with infectious disease on Tuesday so I will leave  further antibiotics to them. I did also note that the pathology for the bone I sent did not show osteomyelitis although this may not have been the right bone area. In any case I think the patient needs to be treated. The patient is blind. I think he is going to need a total contact cast if he can tolerate this because of balance issues however I want to be sure that we adequately treat his infection in the heel before going this route. 12/22/2019; patient was kindly seen by Dr. Gale Journey of infectious disease. The patient is a diabetic who presented to our clinic status post third ray amputation. He was being followed at by Dr. Jess Hall office. This was a nonhealing area largely secondary to poorly controlled chronic venous insufficiency and lymphedema. Once that was controlled this has closed markedly. However he arrived in clinic with a deep probing wound to bone on the plantar heel as well as a nonhealing wound over the fourth metatarsal head. MRI suggested osteomyelitis in the heel. Although the pathology of the bone I took out of his heel was negative he grew group B strep and I have had him on Augmentin. Dr. Gale Journey ordered lab work including CMP, CBC with differential, sedimentation rate and C-reactive protein all need to follow-up on this. Made comments about a follow-up MRI but as far as I understand only continued the Augmentin pending follow-up in 5 or 6 weeks. We are using silver alginate to the heel and making some progress. Iodoflex to the fifth metatarsal head and we will use silver alginate in his amputation site, perhaps he was putting [facility] and here I am not sure how that happened. He does not have arterial insufficiency. ABI in our clinic was 1.03. He certainly does have chronic venous insufficiency however Objective Constitutional Vitals Time Taken: 7:45 AM, Height: 77 in, Weight: 210 lbs, BMI: 24.9, Temperature: 98.5 F, Pulse: 69 bpm, Respiratory Rate: 20 breaths/min, Blood  Pressure: 151/83 mmHg. Cardiovascular Needle pulses are palpable. Skin changes of chronic venous insufficiency in the lower extremities and dorsal feet. Very dry skin as well. General Notes: Wound exam ooThird ray amputation site may be slightly open this week this was largely closed last week ooThe deep probing area on the plantar heel is better I can no longer probe to bone there is healthier looking granulation here. I removed skin callus and subcutaneous tissue from around the margins. ooFourth metatarsal head this has a deeper undermining area in the proximal part of the wound. But it does not go to bone. I removed callus skin subcutaneous tissue from the margins here and debrided the wound surface of adherent fibrinous debris hemostasis with silver Integumentary (Hair, Skin) Wound #2 status is Open. Original cause of wound was Gradually Appeared. The wound is located on the Left Amputation Site - T The wound measures oe. 2.9cm length x 0.7cm width x 0.2cm depth; 1.594cm^2 area and 0.319cm^3 volume. There is Fat Layer (Subcutaneous Tissue) exposed. There is no tunneling or undermining noted. There is a medium amount of serosanguineous drainage noted. The wound margin is distinct with the outline attached to the wound base. There is medium (34-66%) red, pink granulation within the wound bed. There is a medium (34-66%) amount of necrotic tissue within the wound bed including Adherent Slough. Wound #3 status is Open. Original cause of wound was Gradually Appeared. The wound is located on the Left T Fourth. The wound measures  0.3cm length x oe 0.7cm width x 0.1cm depth; 0.165cm^2 area and 0.016cm^3 volume. There is Fat Layer (Subcutaneous Tissue) exposed. There is no tunneling or undermining noted. There is a small amount of serosanguineous drainage noted. The wound margin is distinct with the outline attached to the wound base. There is large (67- 100%) red, pink granulation within the wound  bed. There is a small (1-33%) amount of necrotic tissue within the wound bed including Adherent Slough. Wound #4 status is Open. Original cause of wound was Gradually Appeared. The wound is located on the Left Metatarsal head fourth. The wound measures 1.4cm length x 1.7cm width x 0.6cm depth; 1.869cm^2 area and 1.122cm^3 volume. There is bone, muscle, and Fat Layer (Subcutaneous Tissue) exposed. There is no tunneling noted, however, there is undermining starting at 10:00 and ending at 2:00 with a maximum distance of 0.6cm. There is a medium amount of serosanguineous drainage noted. The wound margin is distinct with the outline attached to the wound base. There is large (67-100%) red, pink granulation within the wound bed. There is a small (1-33%) amount of necrotic tissue within the wound bed including Adherent Slough. Wound #5 status is Open. Original cause of wound was Gradually Appeared. The wound is located on the Left Calcaneus. The wound measures 1.5cm length x 1.8cm width x 1cm depth; 2.121cm^2 area and 2.121cm^3 volume. There is bone, muscle, and Fat Layer (Subcutaneous Tissue) exposed. There is no tunneling noted, however, there is undermining starting at 12:00 and ending at 6:00 with a maximum distance of 2.4cm. There is a medium amount of serosanguineous drainage noted. The wound margin is distinct with the outline attached to the wound base. There is large (67-100%) red, pink granulation within the wound bed. There is no necrotic tissue within the wound bed. 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 There was a Excisional Skin/Subcutaneous Tissue Debridement with a total area of 2.38 sq cm performed by Chad Dillon., MD. With the following instrument(s): Blade, Curette, and Forceps to remove Viable and Non-Viable tissue/material. Material removed includes Callus, Subcutaneous Tissue, Slough, and Skin: Epidermis after achieving pain control using Other (benzocaine 20% spray). No specimens were taken. A time out was conducted at 08:10, prior to the start of the procedure. A Minimum amount of bleeding was controlled with Pressure. The procedure was tolerated well with a pain level of 0 throughout and a pain level of 0 following the procedure. Post Debridement Measurements: 1.4cm length x 1.7cm width x 0.6cm depth; 1.122cm^3 volume. Character of Wound/Ulcer Post Debridement is improved. Severity of Tissue Post Debridement is: Fat layer exposed. Post procedure Diagnosis Wound #4: Same as Pre-Procedure Wound #5 Pre-procedure diagnosis of Wound #5 is a Diabetic Wound/Ulcer of the Lower Extremity located on the Left Calcaneus .Severity of Tissue Pre Debridement is: Bone involvement without necrosis. There was a Selective/Open Wound Skin/Epidermis Debridement with a total area of 7.5 sq cm performed by Chad Dillon., MD. With the following  instrument(s): Blade, and Forceps to remove Non-Viable tissue/material. Material removed includes Callus and Skin: Epidermis and after achieving pain control using Other (benzocaine 20% spray). No specimens were taken. A time out was conducted at 08:10, prior to the start of the procedure. There was no bleeding. The procedure was tolerated well with a pain level of 0 throughout and a pain level of 0 following the  procedure. Post Debridement Measurements: 1.5cm length x 1.8cm width x 1cm depth; 2.121cm^3 volume. Character of Wound/Ulcer Post Debridement is improved. Severity of Tissue Post Debridement is: Bone involvement without necrosis. Post procedure Diagnosis Wound #5: Same as Pre-Procedure There was a Three Layer Compression Therapy Procedure by Deon Pilling, RN. Post procedure Diagnosis Wound #: Same as Pre-Procedure Plan Follow-up Appointments: Return Appointment in 1 week. Dressing Change Frequency: Change dressing three times week. - all wounds (home health to change Mon and Fri) Skin Barriers/Peri-Wound Care: Barrier cream - to macerated areas of foot Moisturizing lotion - to leg TCA Cream or Ointment - mixed with lotion to left leg Wound Cleansing: Clean wound with Wound Cleanser May shower with protection. Primary Wound Dressing: Wound #2 Left Amputation Site - T oe: Calcium Alginate with Silver Wound #3 Left T Fourth: oe Calcium Alginate with Silver Wound #4 Left Metatarsal head fourth: Iodoflex Wound #5 Left Calcaneus: Calcium Alginate with Silver - packing Secondary Dressing: Wound #2 Left Amputation Site - Toe: Dry Gauze Wound #3 Left T Fourth: oe Dry Gauze Wound #4 Left Metatarsal head fourth: Dry Gauze Wound #5 Left Calcaneus: Dry Gauze Heel Cup Edema Control: 3 Layer Compression System - Left Lower Extremity - stockinnette to cover toes Avoid standing for long periods of time Elevate legs to the level of the heart or above for 30 minutes daily and/or when sitting, a frequency of: Off-Loading: Open toe surgical shoe to: Home Health: [facility] for wound care. - [facility] 1. Continue with silver nitrate to the heel 2. Continue with Iodoflex to the fourth met head 3. Liberal TCA mixed with moisturizing cream to the left leg and dorsal foot 4. 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 Dr. Gale Journey  continued the Augmentin that I had previously ordered and will see him in follow-up. I will need to follow the lab work that Dr. Gale Journey ordered. 5. I am glad to see that oral antibiotics are coming more into play here although he certainly may need IV antibiotics at some point 6. I would like to put him in a total contact cast however I want to be sure we have adequately treated the infection from several points of you before I do this and I have talked to him about this. 7. I do not think anybody would see this man in tell him that he is free from the possibility of a left BKA HOWEVER I do not think that is necessary now and I think there is a possibility to cure this problem including his wounds short of a left BKA 8. I had some thoughts about hyperbaric oxygen but this is a daily Hall and I have not really reasonably discussed this with Electronic Signature(s) Signed: 12/25/2019 1:46:42 PM By: Chad Ham MD Entered By: Chad Hall on 12/22/2019 08:36:33 -------------------------------------------------------------------------------- SuperBill Details Patient Name: Date of Service: Chad Hall. 12/22/2019 Medical Record Number: [id number] Patient Account Number: [id number] Date of Birth/Sex: Treating RN: 26-Jul-1950 (69 y.o. 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 M86.172 Other acute osteomyelitis, left ankle and foot L97.528 Non-pressure chronic ulcer of other part of left foot with other specified severity I87.332 Chronic venous hypertension (idiopathic)  with ulcer and inflammation of left lower extremity Facility Procedures CPT4 Code: [id number] Description: 54301 - DEB SUBQ TISSUE 20 SQ CM/< ICD-10 Diagnosis Description L97.528 Non-pressure chronic ulcer of other part of left foot with other specified severi Modifier: ty Quantity: 1 CPT4 Code: [id number] Description: 53692 - DEBRIDE WOUND 1ST 20 SQ CM OR < ICD-10 Diagnosis Description L97.424 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

## 2019-12-25 NOTE — Progress Notes (Signed)
Chad Hall ([id number]) Visit Report for 12/22/2019 Arrival Information Details Patient Name: Date of Service: Chad Hall [place]. 12/22/2019 7:30 A Hall Medical Record Number: [id number] Patient Account Number: [id number] Date of Birth/Sex: Treating RN: July 28, 1950 (69 y.o. Harlon Flor, Millard.Loa Primary Care Nia Nathaniel: Alanda Amass Other Clinician: Referring Karryn Kosinski: Treating Tambi Thole/Extender: Linton Rump in Treatment: 4 Visit Information History Since Last Visit All ordered tests and consults were completed: Yes Patient Arrived: Cane Added or deleted any medications: No Arrival Time: 07:45 Any new allergies or adverse reactions: No Accompanied By: self Had a fall or experienced change in No Transfer Assistance: None activities of daily living that may affect Patient Identification Verified: Yes risk of falls: Secondary Verification Process Completed: Yes Signs or symptoms of abuse/neglect since No Patient Requires Transmission-Based Precautions: No last visito Patient Has Alerts: No Hospitalized since last visit: No Implantable device outside of the clinic No excluding cellular tissue based products placed in the center since last visit: Has Dressing in Place as Prescribed: No Has Compression in Place as Prescribed: Yes Has Footwear/Offloading in Place as Yes Prescribed: Left: Surgical Shoe with Pressure Relief Insole Pain Present Now: No Notes seen ID yesterday. ID rewrap wounds with Alginate Ag and kerlix and coban. Electronic Signature(s) Signed: 12/22/2019 5:41:13 PM By: Shawn Stall Entered By: Shawn Stall on 12/22/2019 07:55:30 -------------------------------------------------------------------------------- Compression Therapy Details Patient Name: Date of Service: Chad Hall. 12/22/2019 7:30 A Hall Medical Record Number: [id number] Patient Account Number: [id number] Date of Birth/Sex: Treating RN: October 18, 1950 (69 y.o. Damaris Schooner Primary Care Isidore Margraf: Kandis Fantasia, Theron Arista Other Clinician: Referring Edina Winningham: Treating Nyelli Samara/Extender: Mikey College NK, PETER Weeks in Treatment: 4 Compression Therapy Performed for Wound Assessment: NonWound Condition Lymphedema - Left Leg Performed By: Clinician Shawn Stall, RN Compression Type: Three Layer Post Procedure Diagnosis Same as Pre-procedure Electronic Signature(s) Signed: 12/22/2019 6:06:29 PM By: Zenaida Deed RN, BSN Signed: 12/22/2019 6:06:29 PM By: Zenaida Deed RN, BSN Entered By: Zenaida Deed on 12/22/2019 08:22:36 -------------------------------------------------------------------------------- Encounter Discharge Information Details Patient Name: Date of Service: Chad Hall, Chad Hall. 12/22/2019 7:30 A Hall Medical Record Number: [id number] Patient Account Number: [id number] Date of Birth/Sex: Treating RN: [birth year] (69 y.o. Tammy Sours Primary Care Toccara Alford: Alanda Amass Other Clinician: Referring Vin Yonke: Treating Alysson Geist/Extender: Linton Rump in Treatment: 4 Encounter Discharge Information Items Post Procedure Vitals Discharge Condition: Stable Temperature (F): 98.5 Ambulatory Status: Ambulatory Pulse (bpm): 69 Discharge Destination: Home Respiratory Rate (breaths/min): 20 Transportation: Private Auto Blood Pressure (mmHg): 151/83 Accompanied By: self Schedule Follow-up Appointment: Yes Clinical Summary of Care: Electronic Signature(s) Signed: 12/22/2019 5:41:13 PM By: Shawn Stall Entered By: Shawn Stall on 12/22/2019 08:50:48 -------------------------------------------------------------------------------- Lower Extremity Assessment Details Patient Name: Date of Service: Chad Hall. 12/22/2019 7:30 A Hall Medical Record Number: [id number] Patient Account Number: [id number] Date of Birth/Sex: Treating RN: [birth year] (69 y.o. Tammy Sours Primary Care Eniyah Eastmond: Alanda Amass Other Clinician: Referring Krystle Polcyn: Treating Solash Tullo/Extender: Mikey College NK, PETER Weeks in Treatment: 4 Edema Assessment Assessed: Kyra Searles: Yes] [Right: No] Edema: [Left: Ye] [Right: s] Calf Left: Right: Point of Measurement: 47 cm From Medial Instep 32 cm Ankle Left: Right: Point of Measurement: 12 cm From Medial Instep 24 cm Vascular Assessment Pulses: Dorsalis Pedis Palpable: [Left:Yes] Electronic Signature(s) Signed: 12/22/2019 5:41:13 PM By: Shawn Stall Entered By: Shawn Stall on 12/22/2019 07:57:03 -------------------------------------------------------------------------------- Multi Wound Chart Details Patient Name:  Date of Service: Chad Hall [place]. 12/22/2019 7:30 A Hall Medical Record Number: [id number] Patient Account Number: [id number] Date of Birth/Sex: Treating RN: Mar 01, 1950 (69 y.o. Damaris Schooner Primary Care Chariah Bailey: Kandis Fantasia, PETER Other Clinician: Referring Larri Yehle: Treating Kataleah Bejar/Extender: Mikey College NK, PETER Weeks in Treatment: 4 Vital Signs Height(in): 77 Pulse(bpm): 69 Weight(lbs): 210 Blood Pressure(mmHg): 151/83 Body Mass Index(BMI): 25 Temperature(F): 98.5 Respiratory Rate(breaths/min): 20 Photos: [2:No Photos Left Amputation Site - Toe] [3:No Photos Left T Fourth oe] [4:No Photos Left Metatarsal head fourth] Wound Location: [2:Gradually Appeared] [3:Gradually Appeared] [4:Gradually Appeared] Wounding Event: [2:Diabetic Wound/Ulcer of the Lower] [3:Diabetic Wound/Ulcer of the Lower] [4:Diabetic Wound/Ulcer of the Lower] Primary Etiology: [2:Extremity Hypertension, Type II Diabetes,] [3:Extremity Hypertension, Type II Diabetes,] [4:Extremity Hypertension, Type II Diabetes,] Comorbid History: [2:Neuropathy 01/17/2019] [3:Neuropathy 09/17/2019] [4:Neuropathy 08/17/2019] Date Acquired: [2:4] [3:4] [4:4] Weeks of Treatment: [2:Open] [3:Open] [4:Open] Wound Status: [2:2.9x0.7x0.2] [3:0.3x0.7x0.1]  [4:1.4x1.7x0.6] Measurements L x Hall x D (cm) [2:1.594] [3:0.165] [4:1.869] A (cm) : rea [2:0.319] [3:0.016] [4:1.122] Volume (cm) : [2:88.70%] [3:96.50%] [4:-32.20%] % Reduction in A [2:rea: 77.40%] [3:96.60%] [4:-695.70%] % Reduction in Volume: [4:10] Starting Position 1 (o'clock): [4:2] Ending Position 1 (o'clock): [4:0.6] Maximum Distance 1 (cm): [2:No] [3:No] [4:Yes] Undermining: [2:Grade 3] [3:Grade 3] [4:Grade 3] Classification: [2:Medium] [3:Small] [4:Medium] Exudate A mount: [2:Serosanguineous] [3:Serosanguineous] [4:Serosanguineous] Exudate Type: [2:red, brown] [3:red, brown] [4:red, brown] Exudate Color: [2:Distinct, outline attached] [3:Distinct, outline attached] [4:Distinct, outline attached] Wound Margin: [2:Medium (34-66%)] [3:Large (67-100%)] [4:Large (67-100%)] Granulation A mount: [2:Red, Pink] [3:Red, Pink] [4:Red, Pink] Granulation Quality: [2:Medium (34-66%)] [3:Small (1-33%)] [4:Small (1-33%)] Necrotic A mount: [2:Fat Layer (Subcutaneous Tissue): Yes Fat Layer (Subcutaneous Tissue): Yes Fat Layer (Subcutaneous Tissue): Yes] Exposed Structures: [2:Fascia: No Tendon: No Muscle: No Joint: No Bone: No Medium (34-66%)] [3:Fascia: No Tendon: No Muscle: No Joint: No Bone: No Medium (34-66%)] [4:Muscle: Yes Bone: Yes Fascia: No Tendon: No Joint: No None] Epithelialization: [2:N/A] [3:N/A] [4:Debridement - Excisional] Debridement: Pre-procedure Verification/Time Out N/A [3:N/A] [4:08:10] Taken: [2:N/A] [3:N/A] [4:Other] Pain Control: [2:N/A] [3:N/A] [4:Callus, Subcutaneous, Slough] Tissue Debrided: [2:N/A] [3:N/A] [4:Skin/Subcutaneous Tissue] Level: [2:N/A] [3:N/A] [4:2.38] Debridement A (sq cm): [2:rea N/A] [3:N/A] [4:Blade, Curette, Forceps] Instrument: [2:N/A] [3:N/A] [4:Minimum] Bleeding: [2:N/A] [3:N/A] [4:Pressure] Hemostasis A chieved: [2:N/A] [3:N/A] [4:0] Procedural Pain: [2:N/A] [3:N/A] [4:0] Post Procedural Pain: [2:N/A] [3:N/A] [4:Procedure was tolerated  well] Debridement Treatment Response: [2:N/A] [3:N/A] [4:1.4x1.7x0.6] Post Debridement Measurements L x Hall x D (cm) [2:N/A] [3:N/A] [4:1.122] Post Debridement Volume: (cm) [2:N/A] [3:N/A] [4:Debridement] Wound Number: 5 N/A N/A Photos: No Photos N/A N/A Left Calcaneus N/A N/A Wound Location: Gradually Appeared N/A N/A Wounding Event: Diabetic Wound/Ulcer of the Lower N/A N/A Primary Etiology: Extremity Hypertension, Type II Diabetes, N/A N/A Comorbid History: Neuropathy 09/17/2019 N/A N/A Date Acquired: 4 N/A N/A Weeks of Treatment: Open N/A N/A Wound Status: 1.5x1.8x1 N/A N/A Measurements L x Hall x D (cm) 2.121 N/A N/A A (cm) : rea 2.121 N/A N/A Volume (cm) : 28.00% N/A N/A % Reduction in A rea: -2.90% N/A N/A % Reduction in Volume: 12 Starting Position 1 (o'clock): 6 Ending Position 1 (o'clock): 2.4 Maximum Distance 1 (cm): Yes N/A N/A Undermining: Grade 3 N/A N/A Classification: Medium N/A N/A Exudate A mount: Serosanguineous N/A N/A Exudate Type: red, brown N/A N/A Exudate Color: Distinct, outline attached N/A N/A Wound Margin: Large (67-100%) N/A N/A Granulation A mount: Red, Pink N/A N/A Granulation Quality: None Present (0%) N/A N/A Necrotic A mount: Fat Layer (Subcutaneous Tissue): Yes N/A N/A Exposed Structures:  Muscle: Yes Bone: Yes Fascia: No Tendon: No Joint: No None N/A N/A Epithelialization: Debridement - Selective/Open Wound N/A N/A Debridement: Pre-procedure Verification/Time Out 08:10 N/A N/A Taken: Other N/A N/A Pain Control: Callus N/A N/A Tissue Debrided: Skin/Epidermis N/A N/A Level: 7.5 N/A N/A Debridement A (sq cm): rea Blade, Forceps N/A N/A Instrument: None N/A N/A Bleeding: N/A N/A N/A Hemostasis A chieved: 0 N/A N/A Procedural Pain: 0 N/A N/A Post Procedural Pain: Procedure was tolerated well N/A N/A Debridement Treatment Response: 1.5x1.8x1 N/A N/A Post Debridement Measurements L x Hall x D  (cm) 2.121 N/A N/A Post Debridement Volume: (cm) Debridement N/A N/A Procedures Performed: Treatment Notes Electronic Signature(s) Signed: 12/22/2019 6:06:29 PM By: Zenaida Deed RN, BSN Signed: 12/25/2019 1:46:42 PM By: Baltazar Najjar MD Entered By: Baltazar Najjar on 12/22/2019 08:65:78 -------------------------------------------------------------------------------- Multi-Disciplinary Care Plan Details Patient Name: Date of Service: Chad Hall, Chad Hall. 12/22/2019 7:30 A Hall Medical Record Number: [id number] Patient Account Number: [id number] Date of Birth/Sex: Treating RN: March 09, 1950 (69 y.o. Damaris Schooner Primary Care Herb Beltre: Kandis Fantasia, Theron Arista Other Clinician: Referring Danyl Deems: Treating Lakeeta Dobosz/Extender: Linton Rump in Treatment: 4 Active Inactive Nutrition Nursing Diagnoses: Impaired glucose control: actual or potential Goals: Patient/caregiver verbalizes understanding of need to maintain therapeutic glucose control per primary care physician Date Initiated: 11/24/2019 Target Resolution Date: 01/19/2020 Goal Status: Active Interventions: Provide education on elevated blood sugars and impact on wound healing Notes: Soft Tissue Infection Nursing Diagnoses: Impaired tissue integrity Knowledge deficit related to home infection control: handwashing, handling of soiled dressings, supply storage Potential for infection: soft tissue Goals: Patient's soft tissue infection will resolve Date Initiated: 12/08/2019 Target Resolution Date: 01/19/2020 Goal Status: Active Interventions: Assess signs and symptoms of infection every visit Provide education on infection Screen for HBO Treatment Activities: Consult for HBO : 12/08/2019 Culture and sensitivity : 12/08/2019 Education provided on Infection : 12/15/2019 Systemic antibiotics : 12/08/2019 Notes: Electronic Signature(s) Signed: 12/22/2019 6:06:29 PM By: Zenaida Deed RN, BSN Entered By:  Zenaida Deed on 12/22/2019 08:14:04 -------------------------------------------------------------------------------- Pain Assessment Details Patient Name: Date of Service: Chad Hall. 12/22/2019 7:30 A Hall Medical Record Number: [id number] Patient Account Number: [id number] Date of Birth/Sex: Treating RN: [birth year] (69 y.o. Tammy Sours Primary Care Adrienne Trombetta: Alanda Amass Other Clinician: Referring Adlai Sinning: Treating Cabrina Shiroma/Extender: Janifer Adie Weeks in Treatment: 4 Active Problems Location of Pain Severity and Description of Pain Patient Has Paino No Site Locations Rate the pain. Rate the pain. Current Pain Level: 0 Worst Pain Level: 10 Least Pain Level: 0 Tolerable Pain Level: 8 Pain Management and Medication Current Pain Management: Medication: No Cold Application: No Rest: No Massage: No Activity: No T.E.N.S.: No Heat Application: No Leg drop or elevation: No Is the Current Pain Management Adequate: Adequate How does your wound impact your activities of daily livingo Sleep: No Bathing: No Appetite: No Relationship With Others: No Bladder Continence: No Emotions: No Bowel Continence: No Work: No Toileting: No Drive: No Dressing: No Hobbies: No Electronic Signature(s) Signed: 12/22/2019 5:41:13 PM By: Shawn Stall Entered By: Shawn Stall on 12/22/2019 07:56:39 -------------------------------------------------------------------------------- Patient/Caregiver Education Details Patient Name: Date of Service: Chad Hall 11/5/2021andnbsp7:30 A Hall Medical Record Number: [id number] Patient Account Number: [id number] Date of Birth/Gender: Treating RN: Dec 26, 1950 (69 y.o. Damaris Schooner Primary Care Physician: Alanda Amass Other Clinician: Referring Physician: Treating Physician/Extender: Linton Rump in Treatment: 4 Education Assessment Education Provided To: Patient Education  Topics Provided Elevated Blood Sugar/ Impact on Healing: Methods: Explain/Verbal Responses: Reinforcements needed, State content correctly Infection: Methods: Explain/Verbal Responses: Reinforcements needed, State content correctly Wound/Skin Impairment: Methods: Explain/Verbal Responses: Reinforcements needed, State content correctly Electronic Signature(s) Signed: 12/22/2019 6:06:29 PM By: Zenaida Deed RN, BSN Entered By: Zenaida Deed on 12/22/2019 08:14:46 -------------------------------------------------------------------------------- Wound Assessment Details Patient Name: Date of Service: Chad Hall. 12/22/2019 7:30 A Hall Medical Record Number: [id number] Patient Account Number: [id number] Date of Birth/Sex: Treating RN: [birth year] (69 y.o. 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 Location: Left Amputation Site - Toe Wound Status: Open Wounding Event: Gradually Appeared Comorbid History: Hypertension, Type II Diabetes, Neuropathy Date Acquired: 01/17/2019 Weeks Of Treatment: 4 Clustered Wound: No Wound Measurements Length: (cm) 2.9 Width: (cm) 0.7 Depth: (cm) 0.2 Area: (cm) 1.594 Volume: (cm) 0.319 % Reduction in Area: 88.7% % Reduction in Volume: 77.4% Epithelialization: Medium (34-66%) Tunneling: No Undermining: No Wound Description Classification: Grade 3 Wound Margin: Distinct, outline attached Exudate Amount: Medium Exudate Type: Serosanguineous Exudate Color: red, brown Foul Odor After Cleansing: No Slough/Fibrino Yes Wound Bed Granulation Amount: Medium (34-66%) Exposed Structure Granulation Quality: Red, Pink Fascia Exposed: No Necrotic Amount: Medium (34-66%) Fat Layer (Subcutaneous Tissue) Exposed:  Yes Necrotic Quality: Adherent Slough Tendon Exposed: No Muscle Exposed: No Joint Exposed: No Bone Exposed: No Treatment Notes Wound #2 (Left Amputation Site - Toe) 1. Cleanse With Wound Cleanser Soap and water 2. Periwound Care Barrier cream Moisturizing lotion TCA Cream 3. Primary Dressing Applied Calcium Alginate Ag 4. Secondary Dressing Dry Gauze 6. Support Layer Applied 3 layer compression wrap Notes stockinette. Electronic Signature(s) Signed: 12/22/2019 5:41:13 PM By: Shawn Stall Entered By: Shawn Stall on 12/22/2019 07:57:33 -------------------------------------------------------------------------------- Wound Assessment Details Patient Name: Date of Service: Chad Hall. 12/22/2019 7:30 A Hall Medical Record Number: [id number] Patient Account Number: [id number] Date of Birth/Sex: Treating RN: 15-Aug-1950 (69 y.o. Tammy Sours Primary Care Nysa Sarin: Kandis Fantasia, Theron Arista Other Clinician: Referring Lashane Whelpley: Treating Barrett Goldie/Extender: Mikey College NK, PETER Weeks in Treatment: 4 Wound Status Wound Number: 3 Primary Etiology: Diabetic Wound/Ulcer of the Lower Extremity Wound Location: Left T Fourth oe Wound Status: Open Wounding Event: Gradually Appeared Comorbid History: Hypertension, Type II Diabetes, Neuropathy Date Acquired: 09/17/2019 Weeks Of Treatment: 4 Clustered Wound: No Wound Measurements Length: (cm) 0.3 Width: (cm) 0.7 Depth: (cm) 0.1 Area: (cm) 0.165 Volume: (cm) 0.016 % Reduction in Area: 96.5% % Reduction in Volume: 96.6% Epithelialization: Medium (34-66%) Tunneling: No Undermining: No Wound Description Classification: Grade 3 Wound Margin: Distinct, outline attached Exudate Amount: Small Exudate Type: Serosanguineous Exudate Color: red, brown Foul Odor After Cleansing: No Slough/Fibrino Yes Wound Bed Granulation Amount: Large (67-100%) Exposed Structure Granulation Quality: Red, Pink Fascia Exposed: No Necrotic  Amount: Small (1-33%) Fat Layer (Subcutaneous Tissue) Exposed: Yes Necrotic Quality: Adherent Slough Tendon Exposed: No Muscle Exposed: No Joint Exposed: No Bone Exposed: No Treatment Notes Wound #3 (Left Toe Fourth) 1. Cleanse With Wound Cleanser Soap and water 2. Periwound Care Barrier cream Moisturizing lotion TCA Cream 3. Primary Dressing Applied Calcium Alginate Ag 4. Secondary Dressing Dry Gauze 6. Support Layer Applied 3 layer compression wrap Notes stockinette. Electronic Signature(s) Signed: 12/22/2019 5:41:13 PM By: Shawn Stall Entered By: Shawn Stall on 12/22/2019 07:59:48 -------------------------------------------------------------------------------- Wound Assessment Details Patient Name: Date of Service: Chad Blalock  Hall, Chad Hall Medical Record Number: [id number] Patient Account Number: [id number] Date of Birth/Sex: Treating RN: 1951-01-23 (69 y.o. Tammy SoursM) Deaton, Bobbi Primary Care Javonn Gauger: Kandis FantasiaFRA NK, Theron AristaPETER Other Clinician: Referring Loy Little: Treating Nadeen Shipman/Extender: Mikey Collegeobson, Michael FRA NK, PETER Weeks in Treatment: 4 Wound Status Wound Number: 4 Primary Etiology: Diabetic Wound/Ulcer of the Lower Extremity Wound Location: Left Metatarsal head fourth Wound Status: Open Wounding Event: Gradually Appeared Comorbid History: Hypertension, Type II Diabetes, Neuropathy Date Acquired: 08/17/2019 Weeks Of Treatment: 4 Clustered Wound: No Wound Measurements Length: (cm) 1.4 Width: (cm) 1.7 Depth: (cm) 0.6 Area: (cm) 1.869 Volume: (cm) 1.122 % Reduction in Area: -32.2% % Reduction in Volume: -695.7% Epithelialization: None Tunneling: No Undermining: Yes Starting Position (o'clock): 10 Ending Position (o'clock): 2 Maximum Distance: (cm) 0.6 Wound Description Classification: Grade 3 Wound Margin: Distinct, outline attached Exudate Amount: Medium Exudate Type: Serosanguineous Exudate Color: red, brown Foul Odor After Cleansing:  No Slough/Fibrino Yes Wound Bed Granulation Amount: Large (67-100%) Exposed Structure Granulation Quality: Red, Pink Fascia Exposed: No Necrotic Amount: Small (1-33%) Fat Layer (Subcutaneous Tissue) Exposed: Yes Necrotic Quality: Adherent Slough Tendon Exposed: No Muscle Exposed: Yes Necrosis of Muscle: No Joint Exposed: No Bone Exposed: Yes Treatment Notes Wound #4 (Left Metatarsal head fourth) 1. Cleanse With Wound Cleanser Soap and water 2. Periwound Care Barrier cream Moisturizing lotion TCA Cream 3. Primary Dressing Applied Iodoflex 4. Secondary Dressing Dry Gauze 6. Support Layer Applied 3 layer compression wrap Notes stockinette. Electronic Signature(s) Signed: 12/22/2019 5:41:13 PM By: Shawn Stalleaton, Bobbi Entered By: Shawn Stalleaton, Bobbi on 12/22/2019 08:00:26 -------------------------------------------------------------------------------- Wound Assessment Details Patient Name: Date of Service: Chad Hall, Chad Hall Medical Record Number: [id number] Patient Account Number: [id number] Date of Birth/Sex: Treating RN: 1951-01-23 (69 y.o. Tammy SoursM) Deaton, Bobbi Primary Care Naveh Rickles: Kandis FantasiaFRA NK, Theron AristaPETER Other Clinician: Referring Maricruz Lucero: Treating Taylormarie Register/Extender: Mikey Collegeobson, Michael FRA NK, PETER Weeks in Treatment: 4 Wound Status Wound Number: 5 Primary Etiology: Diabetic Wound/Ulcer of the Lower Extremity Wound Location: Left Calcaneus Wound Status: Open Wounding Event: Gradually Appeared Comorbid History: Hypertension, Type II Diabetes, Neuropathy Date Acquired: 09/17/2019 Weeks Of Treatment: 4 Clustered Wound: No Wound Measurements Length: (cm) 1.5 Width: (cm) 1.8 Depth: (cm) 1 Area: (cm) 2.121 Volume: (cm) 2.121 % Reduction in Area: 28% % Reduction in Volume: -2.9% Epithelialization: None Tunneling: No Undermining: Yes Starting Position (o'clock): 12 Ending Position (o'clock): 6 Maximum Distance: (cm) 2.4 Wound Description Classification: Grade  3 Wound Margin: Distinct, outline attached Exudate Amount: Medium Exudate Type: Serosanguineous Exudate Color: red, brown Foul Odor After Cleansing: No Slough/Fibrino No Wound Bed Granulation Amount: Large (67-100%) Exposed Structure Granulation Quality: Red, Pink Fascia Exposed: No Necrotic Amount: None Present (0%) Fat Layer (Subcutaneous Tissue) Exposed: Yes Tendon Exposed: No Muscle Exposed: Yes Necrosis of Muscle: No Joint Exposed: No Bone Exposed: Yes Treatment Notes Wound #5 (Left Calcaneus) 1. Cleanse With Wound Cleanser Soap and water 2. Periwound Care Barrier cream Moisturizing lotion TCA Cream 3. Primary Dressing Applied Calcium Alginate Ag 4. Secondary Dressing Heel Cup 6. Support Layer Applied 3 layer compression wrap Notes stockinette. Electronic Signature(s) Signed: 12/22/2019 5:41:13 PM By: Shawn Stalleaton, Bobbi Entered By: Shawn Stalleaton, Bobbi on 12/22/2019 08:01:01 -------------------------------------------------------------------------------- Vitals Details Patient Name: Date of Service: Chad Hall, Chad Hall Medical Record Number: [id number] Patient Account Number: [id number] Date of Birth/Sex: Treating RN: 1951-01-23 (69 y.o. Tammy SoursM) Deaton, Bobbi Primary Care Vayden Weinand: Alanda AmassFRA NK, PETER Other Clinician: Referring Jeremias Broyhill: Treating Relena Ivancic/Extender: Mikey Collegeobson, Michael FRA NK,  PETER Weeks in Treatment: 4 Vital Signs Time Taken: 07:45 Temperature (F): 98.5 Height (in): 77 Pulse (bpm): 69 Weight (lbs): 210 Respiratory Rate (breaths/min): 20 Body Mass Index (BMI): 24.9 Blood Pressure (mmHg): 151/83 Reference Range: 80 - 120 mg / dl Electronic Signature(s) Signed: 12/22/2019 5:41:13 PM By: Shawn Stall Entered By: Shawn Stall on 12/22/2019 07:55:53

## 2019-12-29 ENCOUNTER — Encounter (HOSPITAL_BASED_OUTPATIENT_CLINIC_OR_DEPARTMENT_OTHER): Payer: Medicare Other | Admitting: Internal Medicine

## 2020-01-01 ENCOUNTER — Emergency Department (HOSPITAL_COMMUNITY): Payer: Medicare Other

## 2020-01-01 ENCOUNTER — Encounter (HOSPITAL_COMMUNITY): Payer: Self-pay

## 2020-01-01 ENCOUNTER — Other Ambulatory Visit: Payer: Self-pay

## 2020-01-01 ENCOUNTER — Inpatient Hospital Stay (HOSPITAL_COMMUNITY)
Admission: EM | Admit: 2020-01-01 | Discharge: 2020-01-08 | DRG: 854 | Disposition: A | Discharge status: Skilled Nursing Facility | Payer: Medicare Other | Attending: Internal Medicine | Admitting: Internal Medicine

## 2020-01-01 DIAGNOSIS — E1142 Type 2 diabetes mellitus with diabetic polyneuropathy: Secondary | ICD-10-CM | POA: Diagnosis present

## 2020-01-01 DIAGNOSIS — E11622 Type 2 diabetes mellitus with other skin ulcer: Secondary | ICD-10-CM | POA: Diagnosis present

## 2020-01-01 DIAGNOSIS — N1831 Chronic kidney disease, stage 3a: Secondary | ICD-10-CM | POA: Diagnosis present

## 2020-01-01 DIAGNOSIS — M869 Osteomyelitis, unspecified: Secondary | ICD-10-CM | POA: Diagnosis not present

## 2020-01-01 DIAGNOSIS — E1151 Type 2 diabetes mellitus with diabetic peripheral angiopathy without gangrene: Secondary | ICD-10-CM | POA: Diagnosis present

## 2020-01-01 DIAGNOSIS — Z87891 Personal history of nicotine dependence: Secondary | ICD-10-CM | POA: Diagnosis not present

## 2020-01-01 DIAGNOSIS — Q8740 Marfan's syndrome, unspecified: Secondary | ICD-10-CM | POA: Diagnosis not present

## 2020-01-01 DIAGNOSIS — Z20822 Contact with and (suspected) exposure to covid-19: Secondary | ICD-10-CM | POA: Diagnosis present

## 2020-01-01 DIAGNOSIS — E11628 Type 2 diabetes mellitus with other skin complications: Secondary | ICD-10-CM | POA: Diagnosis present

## 2020-01-01 DIAGNOSIS — E785 Hyperlipidemia, unspecified: Secondary | ICD-10-CM | POA: Diagnosis present

## 2020-01-01 DIAGNOSIS — M86172 Other acute osteomyelitis, left ankle and foot: Secondary | ICD-10-CM | POA: Diagnosis present

## 2020-01-01 DIAGNOSIS — Z89422 Acquired absence of other left toe(s): Secondary | ICD-10-CM

## 2020-01-01 DIAGNOSIS — I10 Essential (primary) hypertension: Secondary | ICD-10-CM | POA: Diagnosis present

## 2020-01-01 DIAGNOSIS — N179 Acute kidney failure, unspecified: Secondary | ICD-10-CM | POA: Diagnosis present

## 2020-01-01 DIAGNOSIS — Z8249 Family history of ischemic heart disease and other diseases of the circulatory system: Secondary | ICD-10-CM

## 2020-01-01 DIAGNOSIS — D649 Anemia, unspecified: Secondary | ICD-10-CM | POA: Diagnosis present

## 2020-01-01 DIAGNOSIS — E1149 Type 2 diabetes mellitus with other diabetic neurological complication: Secondary | ICD-10-CM | POA: Diagnosis present

## 2020-01-01 DIAGNOSIS — L089 Local infection of the skin and subcutaneous tissue, unspecified: Secondary | ICD-10-CM

## 2020-01-01 DIAGNOSIS — E1169 Type 2 diabetes mellitus with other specified complication: Secondary | ICD-10-CM | POA: Diagnosis present

## 2020-01-01 DIAGNOSIS — R7881 Bacteremia: Secondary | ICD-10-CM | POA: Diagnosis not present

## 2020-01-01 DIAGNOSIS — A419 Sepsis, unspecified organism: Principal | ICD-10-CM | POA: Diagnosis present

## 2020-01-01 DIAGNOSIS — Z79899 Other long term (current) drug therapy: Secondary | ICD-10-CM | POA: Diagnosis not present

## 2020-01-01 DIAGNOSIS — R6520 Severe sepsis without septic shock: Secondary | ICD-10-CM

## 2020-01-01 DIAGNOSIS — H547 Unspecified visual loss: Secondary | ICD-10-CM | POA: Diagnosis present

## 2020-01-01 DIAGNOSIS — E1122 Type 2 diabetes mellitus with diabetic chronic kidney disease: Secondary | ICD-10-CM | POA: Diagnosis present

## 2020-01-01 DIAGNOSIS — L98499 Non-pressure chronic ulcer of skin of other sites with unspecified severity: Secondary | ICD-10-CM | POA: Diagnosis present

## 2020-01-01 DIAGNOSIS — I129 Hypertensive chronic kidney disease with stage 1 through stage 4 chronic kidney disease, or unspecified chronic kidney disease: Secondary | ICD-10-CM | POA: Diagnosis present

## 2020-01-01 LAB — CBC WITH DIFFERENTIAL/PLATELET
Abs Immature Granulocytes: 0.06 10*3/uL (ref 0.00–0.07)
Basophils Absolute: 0 10*3/uL (ref 0.0–0.1)
Basophils Relative: 0 %
Eosinophils Absolute: 0 10*3/uL (ref 0.0–0.5)
Eosinophils Relative: 0 %
HCT: 31.4 % — ABNORMAL LOW (ref 39.0–52.0)
Hemoglobin: 9.5 g/dL — ABNORMAL LOW (ref 13.0–17.0)
Immature Granulocytes: 0 %
Lymphocytes Relative: 6 %
Lymphs Abs: 0.7 10*3/uL (ref 0.7–4.0)
MCH: 29.1 pg (ref 26.0–34.0)
MCHC: 30.3 g/dL (ref 30.0–36.0)
MCV: 96 fL (ref 80.0–100.0)
Monocytes Absolute: 1 10*3/uL (ref 0.1–1.0)
Monocytes Relative: 7 %
Neutro Abs: 11.6 10*3/uL — ABNORMAL HIGH (ref 1.7–7.7)
Neutrophils Relative %: 87 %
Platelets: 336 10*3/uL (ref 150–400)
RBC: 3.27 MIL/uL — ABNORMAL LOW (ref 4.22–5.81)
RDW: 13.4 % (ref 11.5–15.5)
WBC: 13.4 10*3/uL — ABNORMAL HIGH (ref 4.0–10.5)
nRBC: 0 % (ref 0.0–0.2)

## 2020-01-01 LAB — COMPREHENSIVE METABOLIC PANEL
ALT: 35 U/L (ref 0–44)
AST: 82 U/L — ABNORMAL HIGH (ref 15–41)
Albumin: 2.6 g/dL — ABNORMAL LOW (ref 3.5–5.0)
Alkaline Phosphatase: 58 U/L (ref 38–126)
Anion gap: 12 (ref 5–15)
BUN: 49 mg/dL — ABNORMAL HIGH (ref 8–23)
CO2: 23 mmol/L (ref 22–32)
Calcium: 8.2 mg/dL — ABNORMAL LOW (ref 8.9–10.3)
Chloride: 104 mmol/L (ref 98–111)
Creatinine, Ser: 1.82 mg/dL — ABNORMAL HIGH (ref 0.61–1.24)
GFR, Estimated: 39 mL/min — ABNORMAL LOW (ref >60)
Glucose, Bld: 143 mg/dL — ABNORMAL HIGH (ref 70–99)
Potassium: 4.1 mmol/L (ref 3.5–5.1)
Sodium: 139 mmol/L (ref 135–145)
Total Bilirubin: 1.4 mg/dL — ABNORMAL HIGH (ref 0.3–1.2)
Total Protein: 7.2 g/dL (ref 6.5–8.1)

## 2020-01-01 LAB — LACTIC ACID, PLASMA
Lactic Acid, Venous: 1.3 mmol/L (ref 0.5–1.9)
Lactic Acid, Venous: 1 mmol/L (ref 0.5–1.9)

## 2020-01-01 LAB — HEMOGLOBIN A1C
Hgb A1c MFr Bld: 5.3 % (ref 4.8–5.6)
Mean Plasma Glucose: 105.41 mg/dL

## 2020-01-01 LAB — RESPIRATORY PANEL BY RT PCR (FLU A&B, COVID)
Influenza A by PCR: NEGATIVE
Influenza B by PCR: NEGATIVE
SARS Coronavirus 2 by RT PCR: NEGATIVE

## 2020-01-01 LAB — CBG MONITORING, ED: Glucose-Capillary: 139 mg/dL — ABNORMAL HIGH (ref 70–99)

## 2020-01-01 LAB — PROTIME-INR
INR: 1.1 (ref 0.8–1.2)
Prothrombin Time: 14.1 seconds (ref 11.4–15.2)

## 2020-01-01 LAB — APTT: aPTT: 34 seconds (ref 24–36)

## 2020-01-01 IMAGING — DX DG FOOT COMPLETE 3+V*L*
3 series · 3 of 3 positions shown · non-contrast
Comparison: Radiographs [DATE] and MRI left foot [DATE]

CLINICAL DATA: Plantar foot wound.

EXAM:
LEFT FOOT - COMPLETE 3+ VIEW

[foot ap]
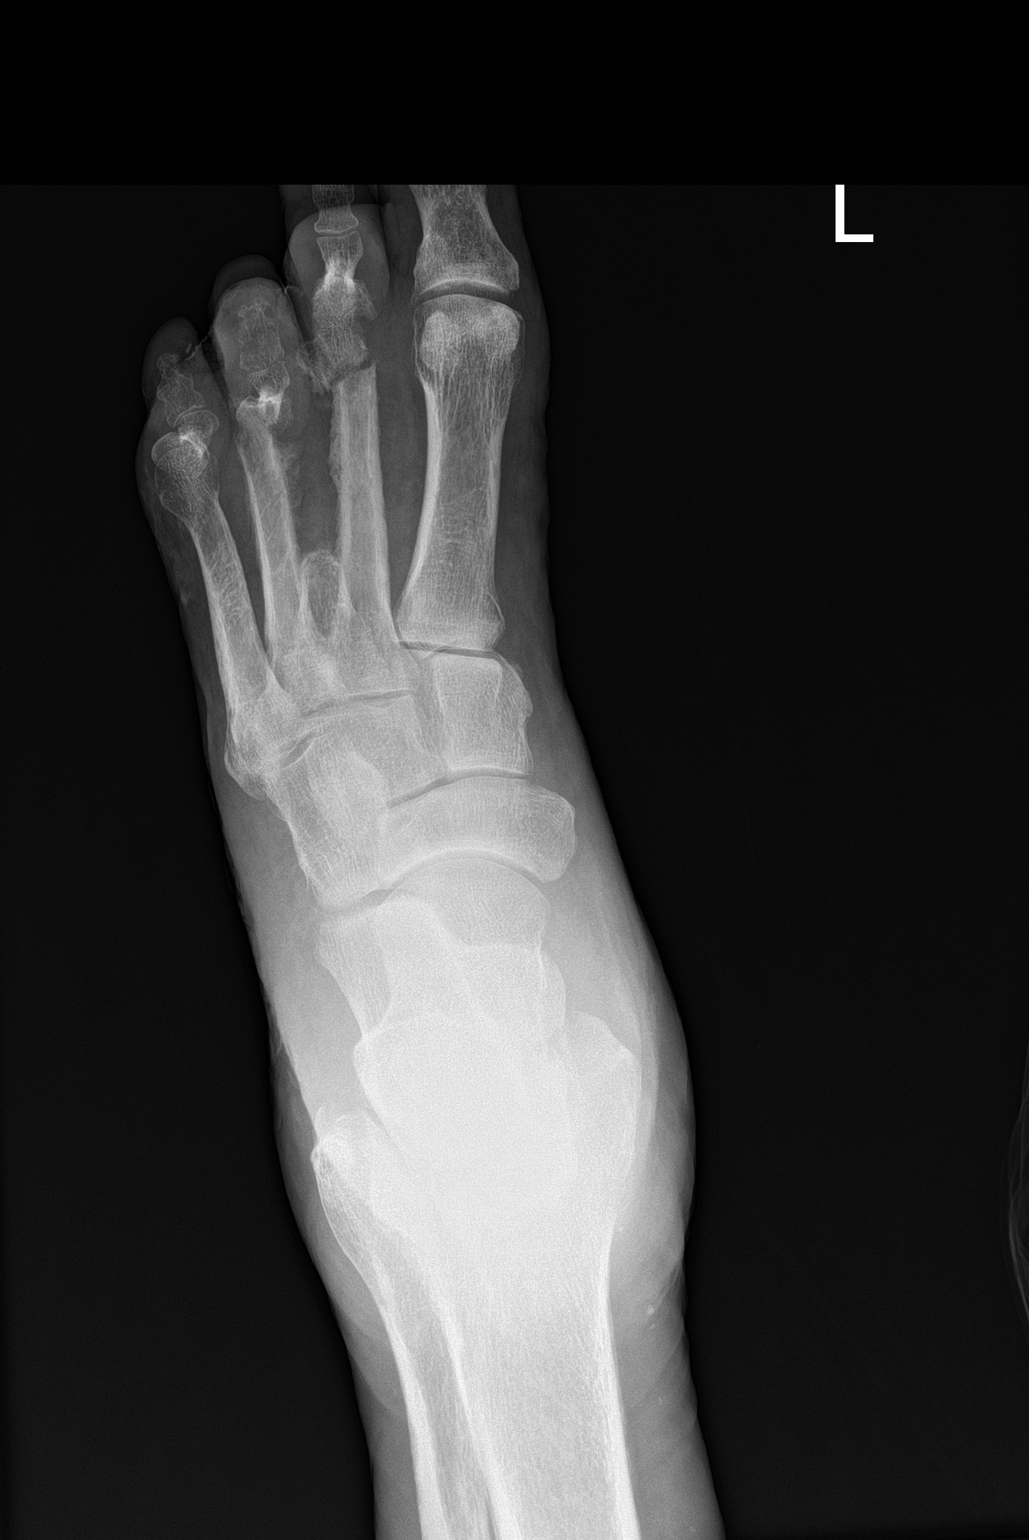

[foot obl]
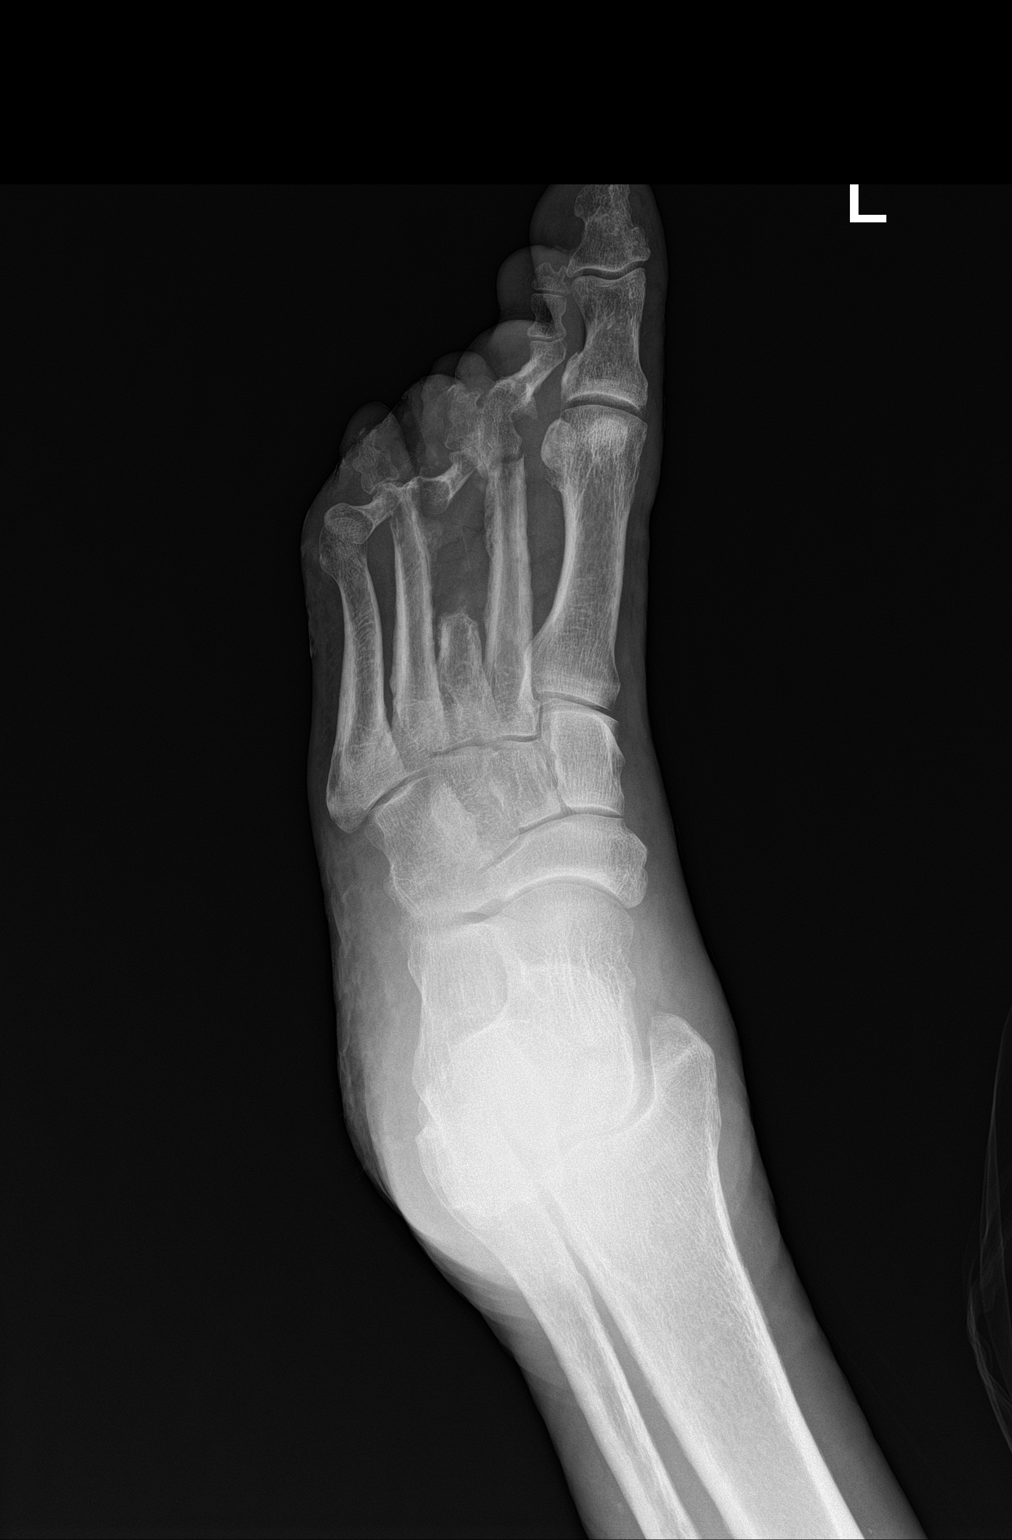

[foot lat]
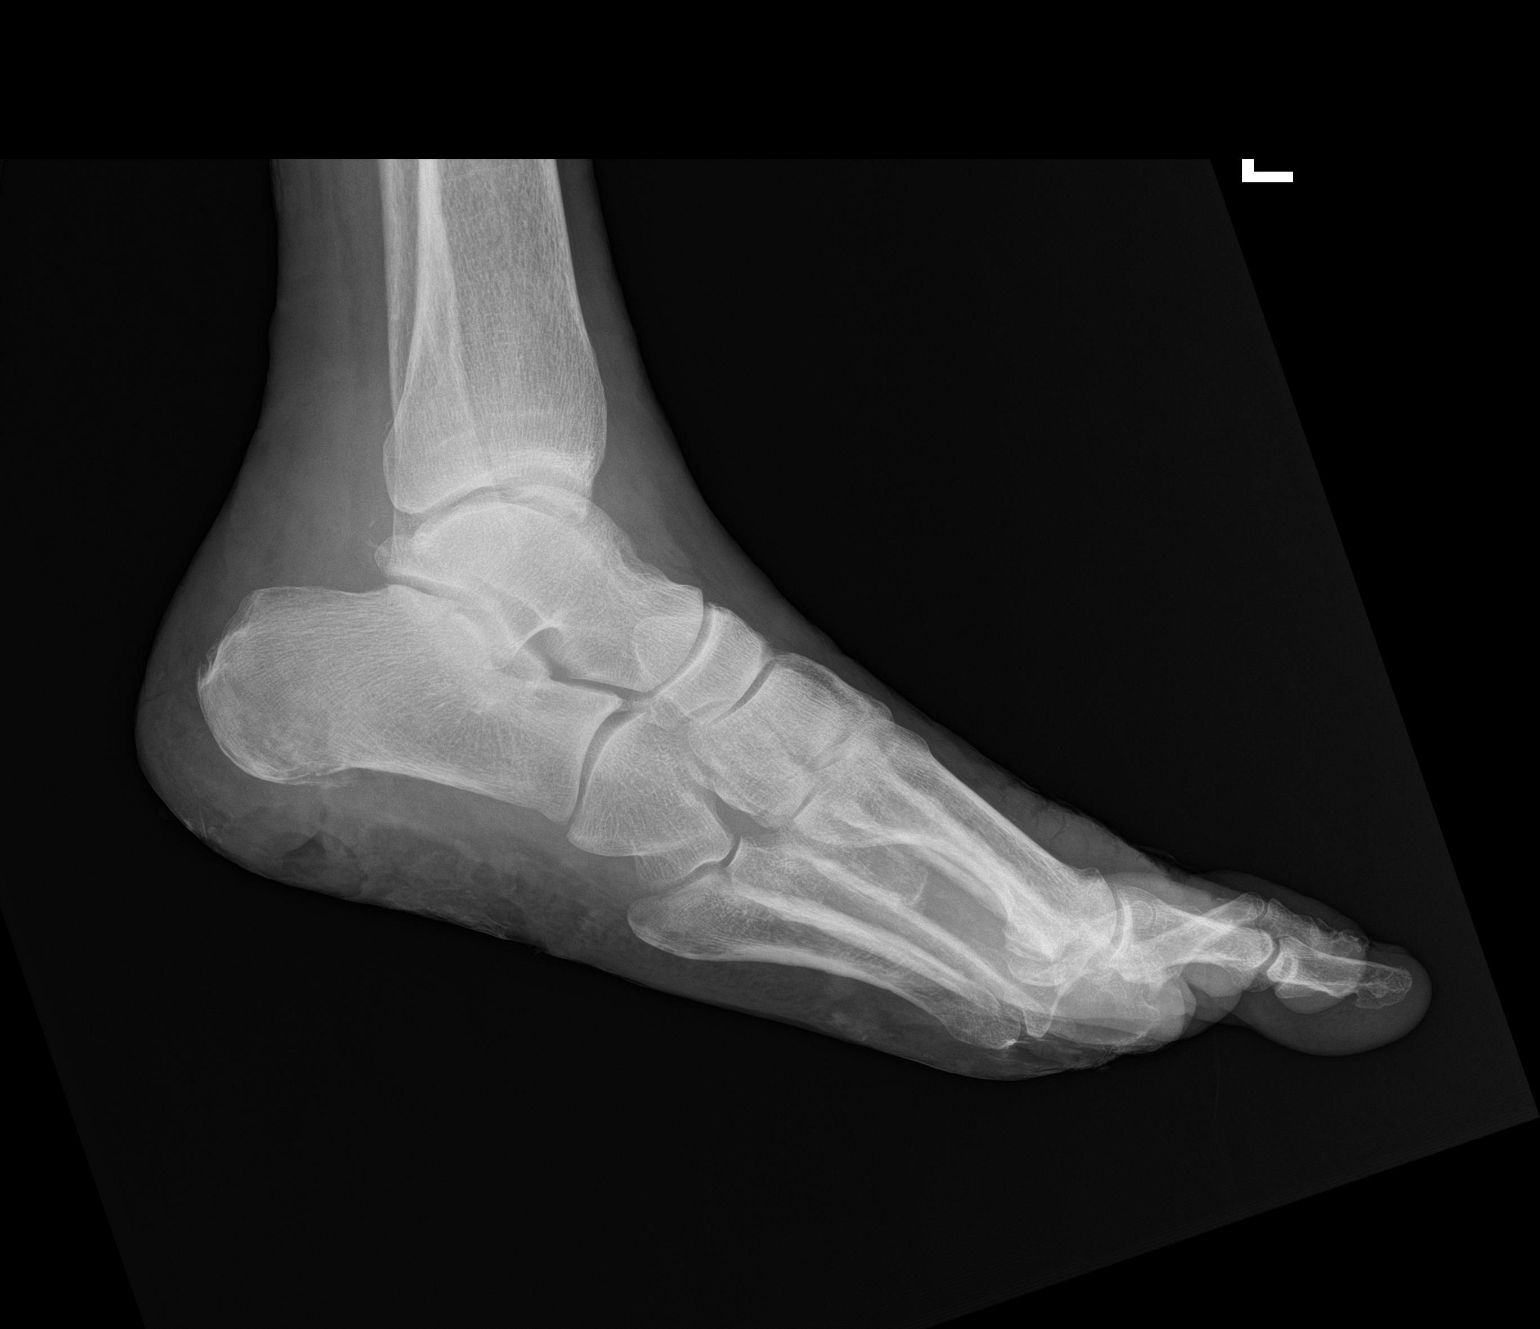

[3 of 3 positions shown; findings below may reference images not displayed]

FINDINGS: There is a new fracture involving the second metatarsal neck. The
fourth metatarsal neck fracture seen on the prior x-rays
demonstrates interval healing changes.

Stable appearing MTP joint subluxations. First MTP joint is
maintained.

Remote amputation of third ray.

The lateral film demonstrates a fairly extensive soft tissue defect
involving the plantar aspect of the hindfoot. I do not see any
obvious destructive bony changes involving the plantar cortex of the
calcaneus. There is some mild lucency in the calcaneus posteriorly.
Could not exclude osteomyelitis.
IMPRESSION: 1. New fracture involving the second metatarsal neck.
2. Healing fourth metatarsal neck fracture.
3. Large soft tissue defect involving the plantar aspect of the
hindfoot.
4. Mild lucency in the calcaneus posteriorly. Could not exclude
osteomyelitis.

## 2020-01-01 MED ORDER — INSULIN ASPART 100 UNIT/ML ~~LOC~~ SOLN
0.0000 [IU] | Freq: Three times a day (TID) | SUBCUTANEOUS | Status: DC
  Administered 2020-01-01 – 2020-01-04 (×3): 1 [IU] via SUBCUTANEOUS
  Administered 2020-01-04 – 2020-01-05 (×2): 2 [IU] via SUBCUTANEOUS
  Administered 2020-01-05: 1 [IU] via SUBCUTANEOUS
  Filled 2020-01-01: qty 0.09

## 2020-01-01 MED ORDER — SODIUM CHLORIDE 0.9 % IV SOLN
2.0000 g | Freq: Once | INTRAVENOUS | Status: AC
  Administered 2020-01-01: 2 g via INTRAVENOUS
  Filled 2020-01-01: qty 20

## 2020-01-01 MED ORDER — ONDANSETRON HCL 4 MG/2ML IJ SOLN: 4.0000 mg | Freq: Four times a day (QID) | INTRAMUSCULAR | Status: DC | PRN

## 2020-01-01 MED ORDER — VITAMIN D 25 MCG (1000 UNIT) PO TABS
1000.0000 [IU] | ORAL_TABLET | Freq: Every day | ORAL | Status: DC
  Administered 2020-01-01 – 2020-01-08 (×8): 1000 [IU] via ORAL
  Filled 2020-01-01 (×8): qty 1

## 2020-01-01 MED ORDER — ACETAMINOPHEN 325 MG PO TABS
650.0000 mg | ORAL_TABLET | Freq: Four times a day (QID) | ORAL | Status: DC | PRN
  Administered 2020-01-03 (×2): 650 mg via ORAL
  Filled 2020-01-01 (×3): qty 2

## 2020-01-01 MED ORDER — LACTATED RINGERS IV SOLN: INTRAVENOUS | Status: AC

## 2020-01-01 MED ORDER — ATORVASTATIN CALCIUM 80 MG PO TABS
80.0000 mg | ORAL_TABLET | Freq: Every day | ORAL | Status: DC
  Administered 2020-01-04 – 2020-01-08 (×5): 80 mg via ORAL
  Filled 2020-01-01: qty 1
  Filled 2020-01-01: qty 2
  Filled 2020-01-01 (×6): qty 1

## 2020-01-01 MED ORDER — SODIUM CHLORIDE 0.9 % IV SOLN
2.0000 g | INTRAVENOUS | Status: DC
  Administered 2020-01-02: 2 g via INTRAVENOUS
  Filled 2020-01-01: qty 20

## 2020-01-01 MED ORDER — HYDRALAZINE HCL 10 MG PO TABS
10.0000 mg | ORAL_TABLET | Freq: Three times a day (TID) | ORAL | Status: DC
  Administered 2020-01-01 – 2020-01-02 (×4): 10 mg via ORAL
  Filled 2020-01-01 (×4): qty 1

## 2020-01-01 MED ORDER — SODIUM CHLORIDE 0.9 % IV SOLN: INTRAVENOUS | Status: AC

## 2020-01-01 MED ORDER — GABAPENTIN 100 MG PO CAPS
100.0000 mg | ORAL_CAPSULE | Freq: Every day | ORAL | Status: DC
  Administered 2020-01-04 – 2020-01-08 (×5): 100 mg via ORAL
  Filled 2020-01-01 (×6): qty 1

## 2020-01-01 MED ORDER — VANCOMYCIN HCL IN DEXTROSE 1-5 GM/200ML-% IV SOLN
1000.0000 mg | Freq: Once | INTRAVENOUS | Status: AC
  Administered 2020-01-01: 1000 mg via INTRAVENOUS
  Filled 2020-01-01: qty 200

## 2020-01-01 MED ORDER — ACETAMINOPHEN 650 MG RE SUPP
650.0000 mg | Freq: Four times a day (QID) | RECTAL | Status: DC | PRN
  Filled 2020-01-01: qty 1

## 2020-01-01 MED ORDER — ONDANSETRON HCL 4 MG PO TABS: 4.0000 mg | ORAL_TABLET | Freq: Four times a day (QID) | ORAL | Status: DC | PRN

## 2020-01-01 MED ORDER — LACTATED RINGERS IV BOLUS (SEPSIS)
1000.0000 mL | Freq: Once | INTRAVENOUS | Status: AC
  Administered 2020-01-01: 1000 mL via INTRAVENOUS

## 2020-01-01 MED ORDER — LABETALOL HCL 5 MG/ML IV SOLN
10.0000 mg | Freq: Once | INTRAVENOUS | Status: AC
  Administered 2020-01-01: 10 mg via INTRAVENOUS
  Filled 2020-01-01: qty 4

## 2020-01-01 MED ORDER — OXYCODONE HCL 5 MG PO TABS
5.0000 mg | ORAL_TABLET | Freq: Four times a day (QID) | ORAL | Status: DC | PRN
  Administered 2020-01-02 – 2020-01-04 (×5): 5 mg via ORAL
  Filled 2020-01-01 (×5): qty 1

## 2020-01-01 MED ORDER — VANCOMYCIN HCL 750 MG/150ML IV SOLN
750.0000 mg | Freq: Two times a day (BID) | INTRAVENOUS | Status: DC
  Administered 2020-01-02 – 2020-01-03 (×3): 750 mg via INTRAVENOUS
  Filled 2020-01-01 (×4): qty 150

## 2020-01-01 MED ORDER — METOPROLOL SUCCINATE ER 25 MG PO TB24
25.0000 mg | ORAL_TABLET | Freq: Every day | ORAL | Status: DC
  Administered 2020-01-01 – 2020-01-02 (×2): 25 mg via ORAL
  Filled 2020-01-01 (×2): qty 1

## 2020-01-01 NOTE — ED Notes (Signed)
[facility] called for report to transfer to [facility]

## 2020-01-01 NOTE — ED Provider Notes (Signed)
[facility]   CSN: [id number] Arrival date & time: 01/01/20  1250     History Chief Complaint  Patient presents with  . Weakness  . Wound Check    Chad Hall is a 69 y.o. male.  69 year old male with past medical history below including blindness, Marfan syndrome, type 2 diabetes mellitus, diabetic neuropathy, hypertension, hyperlipidemia who presents with weakness and left foot wound.  Patient previously followed with Dr. Lajoyce Corners and was eventually referred to wound care where he has been receiving chronic wound treatment for his left foot wounds.  He has also followed with infectious disease and is currently on Augmentin for chronic osteomyelitis treatment.  He began feeling generalized weakness and malaise 3 days ago and thus missed his wound clinic visit.  He has continued to feel generally unwell over the weekend.  He denies any known fevers and no cough/cold symptoms, vomiting, or abdominal pain.  Bandage has not been changed for a few days.  The history is provided by the patient.  Weakness Wound Check       Past Medical History:  Diagnosis Date  . Allergy   . Blindness - both eyes 1967   Basketball injury  . Diabetes mellitus   . Hernia   . Hyperlipidemia   . Hypertension   . Marfan syndrome     Patient Active Problem List   Diagnosis Date Noted  . Diabetic foot infection (HCC) 01/01/2020  . Maggot infestation 08/23/2019  . Pyogenic inflammation of bone (HCC)   . Osteomyelitis of foot, left, acute (HCC) 12/26/2018  . Right knee pain 10/04/2017  . AKI (acute kidney injury) (HCC) 07/18/2013  . Left hip pain 04/10/2013  . Diabetic peripheral neuropathy associated with type 2 diabetes mellitus (HCC) 11/10/2012  . Marfan's syndrome 08/08/2012  . Healthcare maintenance 08/08/2012  . Atopic dermatitis 02/16/2012  . Overweight (BMI 25.0-29.9) 07/05/2011  . Allergic rhinitis due to allergen 07/03/2011  . Venous  insufficiency of leg 04/03/2011  . Blindness 07/23/2010  . DM (diabetes mellitus), type 2 with neurological complications (HCC) 06/13/2010  . Hypertension 06/13/2010  . Hyperlipidemia 06/13/2010    Past Surgical History:  Procedure Laterality Date  . AMPUTATION Left 12/28/2018   Procedure: LEFT FOOT 3RD RAY AMPUTATION;  Surgeon: Nadara Mustard, MD;  Location: [facility] OR;  Service: Orthopedics;  Laterality: Left;  . APPENDECTOMY    . INGUINAL HERNIA REPAIR     Right 1970s,    Left 2011 by Dr Andrey Campanile  . INGUINAL HERNIA REPAIR  08/25/10   recurrent left  . PENILE PROSTHESIS IMPLANT  2000s   [facility], Dr Brunilda Payor  . RETINAL DETACHMENT SURGERY  1970  . Rotator Cuff Repair  2009   Left side, Dr Elita Quick   . ROTATOR CUFF REPAIR  2010-2011?   right side       Family History  Problem Relation Age of Onset  . Heart disease Mother   . Breast cancer Sister     Social History   Tobacco Use  . Smoking status: Former Smoker    Quit date: 02/14/1984    Years since quitting: 35.9  . Smokeless tobacco: Never Used  Substance Use Topics  . Alcohol use: No    Comment: Quit in 10/1983  . Drug use: No    Home Medications Prior to Admission medications   Medication Sig Start Date End Date Taking? Authorizing Provider  acetaminophen (TYLENOL) 325 MG tablet Take 2 tablets (650 mg total)  by mouth every 6 (six) hours as needed for mild pain (or Fever >/= 101). 01/01/19  Yes Peggyann Shoals C, DO  amoxicillin-clavulanate (AUGMENTIN) 875-125 MG tablet Take 1 tablet by mouth 2 (two) times daily. 12/21/19 01/20/20 Yes Vu, Tonita Phoenix, MD  atorvastatin (LIPITOR) 80 MG tablet TAKE 1 TABLET BY MOUTH  DAILY AT 6 PM Patient taking differently: Take 80 mg by mouth daily.  09/04/19  Yes [name], MD  cholecalciferol (VITAMIN D3) 25 MCG (1000 UT) tablet Take 1,000 Units by mouth at bedtime.   Yes [provider]  empagliflozin (JARDIANCE) 10 MG TABS tablet Take 1 tablet (10 mg total) by mouth daily.  10/30/19  Yes [name], MD  enalapril (VASOTEC) 20 MG tablet TAKE 1 TABLET BY MOUTH  DAILY 05/27/19  Yes Lockamy, Timothy, DO  gabapentin (NEURONTIN) 100 MG capsule TAKE 1 CAPSULE BY MOUTH  ONCE DAILY 11/13/19  Yes [name], MD  linagliptin (TRADJENTA) 5 MG TABS tablet Take 1 tablet (5 mg total) by mouth at bedtime. 10/30/19  Yes [name], MD  metoprolol succinate (TOPROL-XL) 25 MG 24 hr tablet Take 0.5 tablets (12.5 mg total) by mouth every evening. 06/26/19  Yes Lockamy, Timothy, DO  triamcinolone cream (KENALOG) 0.1 % APPLY TO AFFECTED AREA(S)  TOPICALLY TWICE DAILY Patient taking differently: Apply 1 application topically 2 (two) times daily as needed (dry skin).  10/24/19  Yes [name], MD  augmented betamethasone dipropionate (DIPROLENE-AF) 0.05 % cream APPLY TO AFFECTED AREA(S)  TOPICALLY TWICE DAILY AS  DIRECTED Patient not taking: Reported on 01/01/2020 10/24/19   [name], MD  docusate sodium (COLACE) 100 MG capsule Take 1 capsule (100 mg total) by mouth daily as needed for mild constipation. Patient not taking: Reported on 01/01/2020 01/01/19   Peggyann Shoals C, DO  glipiZIDE (GLUCOTROL) 10 MG tablet Take 1 tablet (10 mg total) by mouth at bedtime. Patient not taking: Reported on 12/21/2019 06/29/19   Arlyce Harman, DO  nitroGLYCERIN (NITRODUR - DOSED IN MG/24 HR) 0.2 mg/hr patch Place 1 patch (0.2 mg total) onto the skin daily. Apply to skin daily to different area of ankle and foot. Remove previous patch Patient not taking: Reported on 01/01/2020 02/23/19   Persons, [place], [place]  ondansetron (ZOFRAN) 4 MG tablet Take 1 tablet (4 mg total) by mouth every 8 (eight) hours as needed for nausea or vomiting. Patient not taking: Reported on 12/21/2019 01/01/19   Peggyann Shoals C, DO  oxyCODONE (OXY IR/ROXICODONE) 5 MG immediate release tablet Take 1 tablet (5 mg total) by mouth every 6 (six) hours as needed for severe pain (pain score 6-10). Patient not taking: Reported on  12/21/2019 01/01/19   Peggyann Shoals C, DO  pentoxifylline (TRENTAL) 400 MG CR tablet TAKE 1 TABLET BY MOUTH 3  TIMES DAILY WITH MEALS Patient not taking: Reported on 12/21/2019 11/28/19   Persons, [place], [place]  PRODIGY NO CODING BLOOD GLUC test strip USE TO CHECK BLOOD SUGAR  THREE TIMES DAILY OR AS  DIRECTED 08/15/19   Arlyce Harman, DO  Prodigy Twist Top Lancets 28G MISC CHECK BLOOD SUGAR 3 TIMES  DAILY 06/15/19   Arlyce Harman, DO  atorvastatin (LIPITOR) 80 MG tablet Take 1 tablet (80 mg total) by mouth daily at 6 PM. 01/01/19   Dollene Cleveland, DO  cephALEXin (KEFLEX) 500 MG capsule Take 1 capsule (500 mg total) by mouth 3 (three) times daily for 16 days. 01/15/19   Peggyann Shoals C, DO  enalapril (VASOTEC) 20  MG tablet TAKE 1 TABLET BY MOUTH  DAILY Patient taking differently: Take 20 mg by mouth at bedtime.  07/04/18   Arlyce Harman, DO  triamcinolone cream (KENALOG) 0.1 % APPLY TO AFFECTED AREA(S)  TOPICALLY TWICE DAILY 02/13/19   Arlyce Harman, DO    Allergies    Patient has no known allergies.  Review of Systems   Review of Systems  Neurological: Positive for weakness.   All other systems reviewed and are negative except that which was mentioned in HPI  Physical Exam Updated Vital Signs BP (!) 177/78   Pulse 91   Temp 99.1 F (37.3 C) (Oral)   Resp 19   SpO2 96%   Physical Exam Vitals and nursing note reviewed.  Constitutional:      General: He is not in acute distress.    Appearance: He is well-developed.  HENT:     Head: Normocephalic and atraumatic.  Eyes:     Conjunctiva/sclera: Conjunctivae normal.  Cardiovascular:     Rate and Rhythm: Regular rhythm. Tachycardia present.     Heart sounds: Normal heart sounds. No murmur heard.   Pulmonary:     Effort: Pulmonary effort is normal.     Breath sounds: Normal breath sounds.  Abdominal:     General: Bowel sounds are normal. There is no distension.     Palpations: Abdomen is soft.     Tenderness:  There is no abdominal tenderness.  Musculoskeletal:        General: No tenderness.     Cervical back: Neck supple.  Skin:    General: Skin is warm and dry.     Comments: Large, deep, draining L plantar heel wound with foul-smelling drainage; another wound on plantar foot near base of toes; scaling skin on dorsal foot with macerated tissue and candida between toes; see photo  Neurological:     Mental Status: He is alert and oriented to person, place, and time.     Comments: Fluent speech  Psychiatric:        Judgment: Judgment normal.         ED Results / Procedures / Treatments   Labs (all labs ordered are listed, but only abnormal results are displayed) Labs Reviewed  COMPREHENSIVE METABOLIC PANEL - Abnormal; Notable for the following components:      Result Value   Glucose, Bld 143 (*)    BUN 49 (*)    Creatinine, Ser 1.82 (*)    Calcium 8.2 (*)    Albumin 2.6 (*)    AST 82 (*)    Total Bilirubin 1.4 (*)    GFR, Estimated 39 (*)    All other components within normal limits  CBC WITH DIFFERENTIAL/PLATELET - Abnormal; Notable for the following components:   WBC 13.4 (*)    RBC 3.27 (*)    Hemoglobin 9.5 (*)    HCT 31.4 (*)    Neutro Abs 11.6 (*)    All other components within normal limits  RESPIRATORY PANEL BY RT PCR (FLU A&B, COVID)  CULTURE, BLOOD (ROUTINE X 2)  CULTURE, BLOOD (ROUTINE X 2)  URINE CULTURE  LACTIC ACID, PLASMA  LACTIC ACID, PLASMA  PROTIME-INR  APTT  URINALYSIS, ROUTINE W REFLEX MICROSCOPIC  HEMOGLOBIN A1C    EKG EKG Interpretation  Date/Time:  Monday January 01 2020 13:34:29 EST Ventricular Rate:  102 PR Interval:    QRS Duration: 93 QT Interval:  347 QTC Calculation: 452 R Axis:   -17 Text Interpretation: Sinus tachycardia Borderline left  axis deviation tachycardia new from previous Confirmed by Frederick PeersLittle, Mellony Danziger [phone] on 01/01/2020 3:08:34 PM   Radiology DG Chest Port 1 View  Result Date: 01/01/2020 CLINICAL DATA:  Sepsis.  EXAM: PORTABLE CHEST 1 VIEW COMPARISON:  12/25/2018 FINDINGS: The cardiac silhouette, mediastinal and hilar contours are within normal limits given the AP projection and portable technique. Mild stable tortuosity of the thoracic aorta. The lungs are clear. No pleural effusions. No pneumothorax. The bony thorax is intact. IMPRESSION: No acute cardiopulmonary findings. Electronically Signed   By: Rudie MeyerP.  Gallerani M.D.   On: 01/01/2020 14:34   DG Foot Complete Left  Result Date: 01/01/2020 CLINICAL DATA:  Plantar foot wound. EXAM: LEFT FOOT - COMPLETE 3+ VIEW COMPARISON:  Radiographs 11/02/2019 and MRI left foot 12/06/2019 FINDINGS: There is a new fracture involving the second metatarsal neck. The fourth metatarsal neck fracture seen on the prior x-rays demonstrates interval healing changes. Stable appearing MTP joint subluxations. First MTP joint is maintained. Remote amputation of third ray. The lateral film demonstrates a fairly extensive soft tissue defect involving the plantar aspect of the hindfoot. I do not see any obvious destructive bony changes involving the plantar cortex of the calcaneus. There is some mild lucency in the calcaneus posteriorly. Could not exclude osteomyelitis. IMPRESSION: 1. New fracture involving the second metatarsal neck. 2. Healing fourth metatarsal neck fracture. 3. Large soft tissue defect involving the plantar aspect of the hindfoot. 4. Mild lucency in the calcaneus posteriorly. Could not exclude osteomyelitis. Electronically Signed   By: Rudie MeyerP.  Gallerani M.D.   On: 01/01/2020 14:41    Procedures Procedures (including critical care time) CRITICAL CARE Performed by: Ambrose Finlandachel Morgan Callen Zuba   Total critical care time: 30 minutes  Critical care time was exclusive of separately billable procedures and treating other patients.  Critical care was necessary to treat or prevent imminent or life-threatening deterioration.  Critical care was time spent personally by me on the  following activities: development of treatment plan with patient and/or surrogate as well as nursing, discussions with consultants, evaluation of patient's response to treatment, examination of patient, obtaining history from patient or surrogate, ordering and performing treatments and interventions, ordering and review of laboratory studies, ordering and review of radiographic studies, pulse oximetry and re-evaluation of patient's condition.  Medications Ordered in ED Medications  lactated ringers infusion (has no administration in time range)  cefTRIAXone (ROCEPHIN) 2 g in sodium chloride 0.9 % 100 mL IVPB (has no administration in time range)  insulin aspart (novoLOG) injection 0-9 Units (has no administration in time range)  atorvastatin (LIPITOR) tablet 80 mg (has no administration in time range)  metoprolol succinate (TOPROL-XL) 24 hr tablet 25 mg (has no administration in time range)  oxyCODONE (Oxy IR/ROXICODONE) immediate release tablet 5 mg (has no administration in time range)  hydrALAZINE (APRESOLINE) tablet 10 mg (has no administration in time range)  labetalol (NORMODYNE) injection 10 mg (has no administration in time range)  vancomycin (VANCOREADY) IVPB 750 mg/150 mL (has no administration in time range)  lactated ringers bolus 1,000 mL (0 mLs Intravenous Stopped 01/01/20 1620)  vancomycin (VANCOCIN) IVPB 1000 mg/200 mL premix (0 mg Intravenous Stopped 01/01/20 1620)  cefTRIAXone (ROCEPHIN) 2 g in sodium chloride 0.9 % 100 mL IVPB (0 g Intravenous Stopped 01/01/20 1519)    ED Course  I have reviewed the triage vital signs and the nursing notes.  Pertinent labs & imaging results that were available during my care of the patient were reviewed by me and considered in  my medical decision making (see chart for details).    MDM Rules/Calculators/A&P                          PT w/ concerning wound on exam, borderline fever and tachycardia. Initiated sepsis protocol w/ cultures,  broad abx for wound infection, IVF Bolus. XR w/ 2nd metatarsal fx and concern for osteo at calcaneus. Discussed w/ Dr. Lajoyce Corners who plans to operatively manage at [facility] Discussed admission to [facility] w/ [facility], Dr. Lafe Garin. PT given labetalol for hypertension. Final Clinical Impression(s) / ED Diagnoses Final diagnoses:  Osteomyelitis of left foot, unspecified type [facility])    Rx / DC Orders ED Discharge Orders    None       Lunabella Badgett, Ambrose Finland, MD 01/01/20 1725

## 2020-01-01 NOTE — Progress Notes (Signed)
A consult was received from an ED physician for vancomycin per pharmacy dosing (for an indication other than meningitis). [name] profile has been reviewed for ht/wt/allergies/indication/available labs. A one time order has been placed for the above antibiotics.  Further antibiotics/pharmacy consults should be ordered by admitting physician if indicated.                       Bernadene Person, PharmD, BCPS [phone] 01/01/2020, 2:17 PM

## 2020-01-01 NOTE — H&P (Signed)
History and Physical    Chad Gibsonhomas W Trusty EAV:[id number] DOB: [birth year] DOA: 01/01/2020  I have briefly reviewed the patient's prior medical records in [facility]  PCP: Mirian MoFrank, Peter, MD  Patient coming from: Home  Chief Complaint: Increased weakness, "feeling hot"  HPI: Chad Hall is a 69 y.o. male with medical history significant of blindness, chronic left lower extremity wound to his left foot, followed by wound center (Dr. Leanord Hawkingobson,), hypertension, hyperlipidemia, type 2 diabetes mellitus, chronic kidney disease stage IIIa, presents to the hospital with chief complaint of weakness.  Patient tells me that he was in his normal state of health up until about 4-5 days ago (last Thursday), and since then he experienced progressive weakness, difficulties ambulating due to generalized fatigue, as well as "feeling hot" at times.  He denies any frank fever or chills.  He is seen in the wound center regularly and most recently saw Dr. Leanord Hawkingobson about couple of weeks ago on 11/5 and he went some wound debridement.  He also has chronic osteomyelitis followed by infectious disease as an outpatient, most recently seen on 11/4 and he was placed on Augmentin for 6-10 weeks from 10/22 and he currently is still on that.  Patient also reports decreased appetite for the past 4 days and poor p.o. intake.  He denies any chest pain, denies any shortness of breath.  He denies any abdominal pain, did have an episode of nausea and vomiting on Thursday but nothing since.  ED Course: In the emergency room he has a low-grade temp of 99.1, tachycardic with heart rate into the low 100, hypertensive with blood pressure into the 170s, satting well on room air.  Blood work shows a white count of 13.4, BUN 49 creatinine 1.8.  Glucose is 143.  Chest x-ray is clear.  Foot x-ray shows a new fracture of the second metatarsal neck, healing fourth metatarsal neck fracture and a large soft tissue defect involving the plantar aspect  of the hindfoot and possible osteomyelitis in the posterior calcaneus.  Dr. Lajoyce Cornersuda with orthopedic surgery was consulted and [facility], likely needs surgery and we are asked to admit  Review of Systems: All systems reviewed, and apart from HPI, all negative  Past Medical History:  Diagnosis Date  . Allergy   . Blindness - both eyes 1967   Basketball injury  . Diabetes mellitus   . Hernia   . Hyperlipidemia   . Hypertension   . Marfan syndrome     Past Surgical History:  Procedure Laterality Date  . AMPUTATION Left 12/28/2018   Procedure: LEFT FOOT 3RD RAY AMPUTATION;  Surgeon: Nadara Mustarduda, Marcus V, MD;  [facility];  Service: Orthopedics;  Laterality: Left;  . APPENDECTOMY    . INGUINAL HERNIA REPAIR     Right 1970s,    Left 2011 by Dr Andrey CampanileWilson  . INGUINAL HERNIA REPAIR  08/25/10   recurrent left  . PENILE PROSTHESIS IMPLANT  2000s   [facility], Dr Brunilda PayorNesi  . RETINAL DETACHMENT SURGERY  1970  . Rotator Cuff Repair  2009   Left side, Dr Elita Quickowen   . ROTATOR CUFF REPAIR  2010-2011?   right side     reports that he quit smoking about 35 years ago. He has never used smokeless tobacco. He reports that he does not drink alcohol and does not use drugs.  No Known Allergies  Family History  Problem Relation Age of Onset  . Heart disease Mother   .  Breast cancer Sister     Prior to Admission medications   Medication Sig Start Date End Date Taking? Authorizing Provider  acetaminophen (TYLENOL) 325 MG tablet Take 2 tablets (650 mg total) by mouth every 6 (six) hours as needed for mild pain (or Fever >/= 101). 01/01/19   Dollene Cleveland, DO  amoxicillin-clavulanate (AUGMENTIN) 875-125 MG tablet Take 1 tablet by mouth 2 (two) times daily. 12/21/19 01/20/20  Vu, Gershon Mussel T, MD  atorvastatin (LIPITOR) 80 MG tablet TAKE 1 TABLET BY MOUTH  DAILY AT 6 PM 09/04/19   [name], MD  augmented betamethasone dipropionate (DIPROLENE-AF) 0.05 % cream APPLY TO AFFECTED AREA(S)   TOPICALLY TWICE DAILY AS  DIRECTED 10/24/19   [name], MD  cholecalciferol (VITAMIN D3) 25 MCG (1000 UT) tablet Take 1,000 Units by mouth at bedtime.    [provider]  docusate sodium (COLACE) 100 MG capsule Take 1 capsule (100 mg total) by mouth daily as needed for mild constipation. 01/01/19   Dollene Cleveland, DO  empagliflozin (JARDIANCE) 10 MG TABS tablet Take 1 tablet (10 mg total) by mouth daily. 10/30/19   [name], MD  enalapril (VASOTEC) 20 MG tablet TAKE 1 TABLET BY MOUTH  DAILY 05/27/19   Arlyce Harman, DO  gabapentin (NEURONTIN) 100 MG capsule TAKE 1 CAPSULE BY MOUTH  ONCE DAILY 11/13/19   [name], MD  glipiZIDE (GLUCOTROL) 10 MG tablet Take 1 tablet (10 mg total) by mouth at bedtime. Patient not taking: Reported on 12/21/2019 06/29/19   Arlyce Harman, DO  linagliptin (TRADJENTA) 5 MG TABS tablet Take 1 tablet (5 mg total) by mouth at bedtime. 10/30/19   [name], MD  metoprolol succinate (TOPROL-XL) 25 MG 24 hr tablet Take 0.5 tablets (12.5 mg total) by mouth every evening. 06/26/19   Arlyce Harman, DO  nitroGLYCERIN (NITRODUR - DOSED IN MG/24 HR) 0.2 mg/hr patch Place 1 patch (0.2 mg total) onto the skin daily. Apply to skin daily to different area of ankle and foot. Remove previous patch 02/23/19   Persons, [place], [place]  ondansetron (ZOFRAN) 4 MG tablet Take 1 tablet (4 mg total) by mouth every 8 (eight) hours as needed for nausea or vomiting. Patient not taking: Reported on 12/21/2019 01/01/19   Peggyann Shoals C, DO  oxyCODONE (OXY IR/ROXICODONE) 5 MG immediate release tablet Take 1 tablet (5 mg total) by mouth every 6 (six) hours as needed for severe pain (pain score 6-10). Patient not taking: Reported on 12/21/2019 01/01/19   Peggyann Shoals C, DO  pentoxifylline (TRENTAL) 400 MG CR tablet TAKE 1 TABLET BY MOUTH 3  TIMES DAILY WITH MEALS Patient not taking: Reported on 12/21/2019 11/28/19   Persons, [place], [place]  PRODIGY NO CODING BLOOD GLUC test  strip USE TO CHECK BLOOD SUGAR  THREE TIMES DAILY OR AS  DIRECTED 08/15/19   Arlyce Harman, DO  Prodigy Twist Top Lancets 28G MISC CHECK BLOOD SUGAR 3 TIMES  DAILY 06/15/19   Arlyce Harman, DO  triamcinolone cream (KENALOG) 0.1 % APPLY TO AFFECTED AREA(S)  TOPICALLY TWICE DAILY 10/24/19   [name], MD  atorvastatin (LIPITOR) 80 MG tablet Take 1 tablet (80 mg total) by mouth daily at 6 PM. 01/01/19   Dollene Cleveland, DO  cephALEXin (KEFLEX) 500 MG capsule Take 1 capsule (500 mg total) by mouth 3 (three) times daily for 16 days. 01/15/19   Peggyann Shoals C, DO  enalapril (VASOTEC) 20 MG tablet TAKE 1 TABLET BY MOUTH  DAILY Patient  taking differently: Take 20 mg by mouth at bedtime.  07/04/18   Arlyce Harman, DO  triamcinolone cream (KENALOG) 0.1 % APPLY TO AFFECTED AREA(S)  TOPICALLY TWICE DAILY 02/13/19   Arlyce Harman, DO    Physical Exam: Vitals:   01/01/20 1430 01/01/20 1435 01/01/20 1500 01/01/20 1530  BP: (!) 178/89 (!) 178/89 (!) 182/143 (!) 194/52  Pulse: 97 96 (!) 101 (!) 108  Resp: (!) 24 20 (!) 24 19  Temp:      TempSrc:      SpO2: 91% 95% 95% 97%    Constitutional: NAD, calm, comfortable Eyes: No scleral icterus ENMT: Mucous membranes are moist. Neck: normal, supple Respiratory: clear to auscultation bilaterally, no wheezing, no crackles. Normal respiratory effort. No accessory muscle use.  Cardiovascular: Regular rate and rhythm, no murmurs / rubs / gallops. No extremity edema. Abdomen: no tenderness, no masses palpated. Bowel sounds positive.  Musculoskeletal: Normal muscle tone.  Skin: Wound on the left foot, no other rashes Neurologic: CN 2-12 grossly intact. Strength 5/5 in all 4.  Psychiatric: Normal judgment and insight. Alert and oriented x 3. Normal mood.          Labs on Admission: I have personally reviewed following labs and imaging studies  CBC: Recent Labs  Lab 01/01/20 1435  WBC 13.4*  NEUTROABS 11.6*  HGB 9.5*  HCT 31.4*  MCV  96.0  PLT 336   Basic Metabolic Panel: Recent Labs  Lab 01/01/20 1435  NA 139  K 4.1  CL 104  CO2 23  GLUCOSE 143*  BUN 49*  CREATININE 1.82*  CALCIUM 8.2*   Liver Function Tests: Recent Labs  Lab 01/01/20 1435  AST 82*  ALT 35  ALKPHOS 58  BILITOT 1.4*  PROT 7.2  ALBUMIN 2.6*   Coagulation Profile: Recent Labs  Lab 01/01/20 1435  INR 1.1   BNP (last 3 results) No results for input(s): PROBNP in the last 8760 hours. CBG: No results for input(s): GLUCAP in the last 168 hours. Thyroid Function Tests: No results for input(s): TSH, T4TOTAL, FREET4, T3FREE, THYROIDAB in the last 72 hours. Urine analysis:    Component Value Date/Time   COLORURINE YELLOW 12/25/2018 1815   APPEARANCEUR CLEAR 12/25/2018 1815   LABSPEC 1.031 (H) 12/25/2018 1815   PHURINE 5.0 12/25/2018 1815   GLUCOSEU >=500 (A) 12/25/2018 1815   HGBUR SMALL (A) 12/25/2018 1815   BILIRUBINUR NEGATIVE 12/25/2018 1815   BILIRUBINUR NEG 01/05/2014 0950   KETONESUR NEGATIVE 12/25/2018 1815   PROTEINUR NEGATIVE 12/25/2018 1815   UROBILINOGEN 1.0 01/05/2014 0950   UROBILINOGEN 1.0 08/21/2010 0837   NITRITE NEGATIVE 12/25/2018 1815   LEUKOCYTESUR NEGATIVE 12/25/2018 1815     Radiological Exams on Admission: DG Chest Port 1 View  Result Date: 01/01/2020 CLINICAL DATA:  Sepsis. EXAM: PORTABLE CHEST 1 VIEW COMPARISON:  12/25/2018 FINDINGS: The cardiac silhouette, mediastinal and hilar contours are within normal limits given the AP projection and portable technique. Mild stable tortuosity of the thoracic aorta. The lungs are clear. No pleural effusions. No pneumothorax. The bony thorax is intact. IMPRESSION: No acute cardiopulmonary findings. Electronically Signed   By: Rudie Meyer M.D.   On: 01/01/2020 14:34   DG Foot Complete Left  Result Date: 01/01/2020 CLINICAL DATA:  Plantar foot wound. EXAM: LEFT FOOT - COMPLETE 3+ VIEW COMPARISON:  Radiographs 11/02/2019 and MRI left foot 12/06/2019 FINDINGS:  There is a new fracture involving the second metatarsal neck. The fourth metatarsal neck fracture seen on the prior x-rays demonstrates  interval healing changes. Stable appearing MTP joint subluxations. First MTP joint is maintained. Remote amputation of third ray. The lateral film demonstrates a fairly extensive soft tissue defect involving the plantar aspect of the hindfoot. I do not see any obvious destructive bony changes involving the plantar cortex of the calcaneus. There is some mild lucency in the calcaneus posteriorly. Could not exclude osteomyelitis. IMPRESSION: 1. New fracture involving the second metatarsal neck. 2. Healing fourth metatarsal neck fracture. 3. Large soft tissue defect involving the plantar aspect of the hindfoot. 4. Mild lucency in the calcaneus posteriorly. Could not exclude osteomyelitis. Electronically Signed   By: Rudie Meyer M.D.   On: 01/01/2020 14:41   EKG: Independently reviewed.  Sinus tachycardia  Assessment/Plan  Principal Problem Sepsis from infected left foot diabetes ulcer, acute on chronic osteomyelitis -Admit patient to the hospital, Dr. Lajoyce Corners with orthopedic surgery consulted -Blood cultures have been obtained, patient was already given vancomycin and ceftriaxone x1 in the ED, continue  Active Problems Type 2 diabetes mellitus -most recent A1c 6.4 but that was in 2020, repeat -Hold home medications and put patient on sliding scale insulin  Acute kidney injury on chronic kidney disease stage IIIa -Baseline creatinine 1.3-1.4, currently on admission at 1.82.  This is likely due to poor p.o. intake in the last few days in the setting of generalized weakness.  Provide IV fluids for the next 24 hours and recheck renal function in the morning -Hold home ACE inhibitor  Essential hypertension -Patient quite hypertensive in the ED, hold ACE inhibitor due to acute kidney injury, resume home Toprol, add low-dose hydralazine in addition to  that.  Hyperlipidemia -Continue home statin  Normocytic anemia -Likely in the context of chronic illness, monitor hemoglobin, no bleeding.  Marfan syndrome, blindness-noted   DVT prophylaxis: SCDs  Code Status: Full code  Family Communication: no family at bedside  Disposition Plan: TBD Bed Type: Medsurg Consults called: orthopedic surgery Dr Lajoyce Corners  Obs/Inp: Inpatient   At the time of admission, it appears that the appropriate admission status for this patient is INPATIENT as it is expected that patient will require hospital care > 2 midnights. This is judged to be reasonable and necessary in order to provide the required intensity of service to ensure the patient's safety given: presenting symptoms, initial radiographic and laboratory data and in the context of their chronic comorbidities. Together, these circumstances are felt to place patient at high at high risk for further clinical deterioration threatening life, limb, or organ.  Pamella Pert, MD, PhD [facility]  Contact via [email or web address]  01/01/2020, 4:18 PM

## 2020-01-01 NOTE — ED Notes (Signed)
Patient has 1 white belongings bag which shoe and medications are in and walking stick on stretcher

## 2020-01-01 NOTE — ED Triage Notes (Signed)
EMS reports from home, chronic wound to left foot treated at wound care, weakness x 4 days. Dressing is supposed to be changed x 2 days Pt unable to go wound care appointment Friday. Pt Hx of diabetes.  BP 160/75 HR 100 RR 18 Sp02 100 RA CBG 162 Temp 98.1

## 2020-01-01 NOTE — Sepsis Progress Note (Signed)
Sepsis protocol being followed by [facility] 

## 2020-01-01 NOTE — Progress Notes (Signed)
Pharmacy Antibiotic Note  Chad Hall is a 69 y.o. male admitted on 01/01/2020 with sepsis.  Pharmacy has been consulted for vancomycin dosing.  Vancomycin 1 g IV x 1 administered in ED today at 1520.   In addition to vancomycin, patient is also on ceftriaxone 2 g IV every 24 hours  Plan: Vancomycin 750 mg IV every 12 hours.  Goal trough 15-20 mcg/mL.  Monitor clinical picture, renal function, vancomycin trough level if indicated F/U C&S, abx deescalation / LOT   Temp (24hrs), Avg:99.1 F (37.3 C), Min:99.1 F (37.3 C), Max:99.1 F (37.3 C)  Recent Labs  Lab 01/01/20 1435  WBC 13.4*  CREATININE 1.82*  LATICACIDVEN 1.3    Estimated Creatinine Clearance: 46.9 mL/min (A) (by C-G formula based on SCr of 1.82 mg/dL (H)).    No Known Allergies  Antimicrobials this admission: 11/15 Ceftriaxone >>  11/15 vancomycin >>   Dose adjustments this admission:   Microbiology results: 11/15 BCx: sent 10/22 wound (heel): few group B strep, rare prevotella bivia  Thank you for allowing pharmacy to be a part of this patient's care.  Royce Macadamia, PharmD, BCPS 01/01/2020 4:55 PM

## 2020-01-01 NOTE — ED Notes (Signed)
Attempted to call report to [id number]. Asked to call back in 20 minutes

## 2020-01-02 ENCOUNTER — Other Ambulatory Visit: Payer: Self-pay | Admitting: Physician Assistant

## 2020-01-02 DIAGNOSIS — E11628 Type 2 diabetes mellitus with other skin complications: Secondary | ICD-10-CM | POA: Diagnosis not present

## 2020-01-02 DIAGNOSIS — N179 Acute kidney failure, unspecified: Secondary | ICD-10-CM | POA: Diagnosis not present

## 2020-01-02 DIAGNOSIS — M869 Osteomyelitis, unspecified: Secondary | ICD-10-CM

## 2020-01-02 DIAGNOSIS — Q8740 Marfan's syndrome, unspecified: Secondary | ICD-10-CM

## 2020-01-02 DIAGNOSIS — R7881 Bacteremia: Secondary | ICD-10-CM

## 2020-01-02 DIAGNOSIS — E1142 Type 2 diabetes mellitus with diabetic polyneuropathy: Secondary | ICD-10-CM | POA: Diagnosis not present

## 2020-01-02 LAB — BLOOD CULTURE ID PANEL (REFLEXED) - BCID2

## 2020-01-02 LAB — URINE CULTURE: Culture: <10000 — AB

## 2020-01-02 LAB — COMPREHENSIVE METABOLIC PANEL
ALT: 35 U/L (ref 0–44)
AST: 83 U/L — ABNORMAL HIGH (ref 15–41)
Albumin: 2 g/dL — ABNORMAL LOW (ref 3.5–5.0)
Alkaline Phosphatase: 45 U/L (ref 38–126)
Anion gap: 11 (ref 5–15)
BUN: 40 mg/dL — ABNORMAL HIGH (ref 8–23)
CO2: 21 mmol/L — ABNORMAL LOW (ref 22–32)
Calcium: 8.3 mg/dL — ABNORMAL LOW (ref 8.9–10.3)
Chloride: 110 mmol/L (ref 98–111)
Creatinine, Ser: 1.46 mg/dL — ABNORMAL HIGH (ref 0.61–1.24)
GFR, Estimated: 51 mL/min — ABNORMAL LOW (ref >60)
Glucose, Bld: 89 mg/dL (ref 70–99)
Potassium: 4.3 mmol/L (ref 3.5–5.1)
Sodium: 142 mmol/L (ref 135–145)
Total Bilirubin: 1.1 mg/dL (ref 0.3–1.2)
Total Protein: 5.8 g/dL — ABNORMAL LOW (ref 6.5–8.1)

## 2020-01-02 LAB — CBC
HCT: 30.6 % — ABNORMAL LOW (ref 39.0–52.0)
Hemoglobin: 9.4 g/dL — ABNORMAL LOW (ref 13.0–17.0)
MCH: 29.1 pg (ref 26.0–34.0)
MCHC: 30.7 g/dL (ref 30.0–36.0)
MCV: 94.7 fL (ref 80.0–100.0)
Platelets: 289 10*3/uL (ref 150–400)
RBC: 3.23 MIL/uL — ABNORMAL LOW (ref 4.22–5.81)
RDW: 13.2 % (ref 11.5–15.5)
WBC: 10.1 10*3/uL (ref 4.0–10.5)
nRBC: 0 % (ref 0.0–0.2)

## 2020-01-02 LAB — GLUCOSE, CAPILLARY
Glucose-Capillary: 97 mg/dL (ref 70–99)
Glucose-Capillary: 78 mg/dL (ref 70–99)
Glucose-Capillary: 109 mg/dL — ABNORMAL HIGH (ref 70–99)

## 2020-01-02 LAB — SURGICAL PCR SCREEN
MRSA, PCR: NEGATIVE
Staphylococcus aureus: NEGATIVE

## 2020-01-02 MED ORDER — CEFAZOLIN SODIUM-DEXTROSE 2-4 GM/100ML-% IV SOLN
2.0000 g | INTRAVENOUS | Status: AC
  Administered 2020-01-03: 2 g via INTRAVENOUS
  Filled 2020-01-02: qty 100

## 2020-01-02 MED ORDER — HYDRALAZINE HCL 25 MG PO TABS
25.0000 mg | ORAL_TABLET | Freq: Four times a day (QID) | ORAL | Status: DC
  Administered 2020-01-03 – 2020-01-08 (×21): 25 mg via ORAL
  Filled 2020-01-02 (×23): qty 1

## 2020-01-02 NOTE — Plan of Care (Signed)
  Problem: Education: Goal: Ability to describe self-care measures that may prevent or decrease complications (Diabetes Survival Skills Education) will improve Outcome: Progressing Goal: Individualized Educational Video(s) Outcome: Progressing   Problem: Coping: Goal: Ability to adjust to condition or change in health will improve Outcome: Progressing   Problem: Health Behavior/Discharge Planning: Goal: Ability to identify and utilize available resources and services will improve Outcome: Progressing Goal: Ability to manage health-related needs will improve Outcome: Progressing   Problem: Skin Integrity: Goal: Risk for impaired skin integrity will decrease Outcome: Progressing   Problem: Nutritional: Goal: Maintenance of adequate nutrition will improve Outcome: Progressing Goal: Progress toward achieving an optimal weight will improve Outcome: Progressing

## 2020-01-02 NOTE — H&P (View-Only) (Signed)
ORTHOPAEDIC CONSULTATION  REQUESTING PHYSICIAN: Swayze, Ava, DO  Chief Complaint: Progressive ulceration and pain left heel with forefoot ulceration as well status post foot salvage intervention with third ray amputation.  HPI: Chad Hall is a 69 y.o. male who presents with progressive ulceration drainage and pain left heel he is status post a third ray amputation where the dorsal incision has healed well.  Patient has a progressive ulcer beneath the fourth metatarsal head.  Past Medical History:  Diagnosis Date  . Allergy   . Blindness - both eyes 1967   Basketball injury  . Diabetes mellitus   . Hernia   . Hyperlipidemia   . Hypertension   . Marfan syndrome    Past Surgical History:  Procedure Laterality Date  . AMPUTATION Left 12/28/2018   Procedure: LEFT FOOT 3RD RAY AMPUTATION;  Surgeon: Nadara Mustard, MD;  Location: [facility] OR;  Service: Orthopedics;  Laterality: Left;  . APPENDECTOMY    . INGUINAL HERNIA REPAIR     Right 1970s,    Left 2011 by Dr Andrey Campanile  . INGUINAL HERNIA REPAIR  08/25/10   recurrent left  . PENILE PROSTHESIS IMPLANT  2000s   [facility], Dr Brunilda Payor  . RETINAL DETACHMENT SURGERY  1970  . Rotator Cuff Repair  2009   Left side, Dr Elita Quick   . ROTATOR CUFF REPAIR  2010-2011?   right side   Social History   Socioeconomic History  . Marital status: Married    Spouse name: JASMIN TRUMBULL  . Number of children: 0  . Years of education: 12th   . Highest education level: Not on file  Occupational History  . 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 Smoking status: Former Smoker    Quit date: 02/14/1984    Years since quitting: 35.9  . Smokeless tobacco: Never Used  Substance and Sexual Activity  . Alcohol use: No    Comment: Quit in 10/1983  . Drug use: No  . Sexual activity: Not on file  Other Topics Concern  . Not on file  Social History Narrative   Lives in [place] with wife, KAYHAN BOARDLEY    Blind.  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   Exercise: Pt has no regular exercise routine.   Seatbelts: Pt reports wearing seatbelt when in vehicles.    Wynelle Link Exposure/Protection:    Hobbies: Proofreader, walking, sports         Social Determinants of Corporate investment banker Strain:   . Difficulty of Paying Living Expenses: Not on file  Food Insecurity:   . Worried About Programme researcher, broadcasting/film/video in the Last Year: Not on file  . Ran Out of Food in the Last Year: Not on file  Transportation Needs:   . Lack of Transportation (Medical): Not on file  . Lack of Transportation (Non-Medical): Not on file  Physical Activity:   . Days of Exercise per Week: Not on file  . Minutes of Exercise per Session: Not on file  Stress:   . Feeling of Stress :  Not on file  Social Connections:   . Frequency of Communication with Friends and Family: Not on file  . Frequency of Social Gatherings with Friends and Family: Not on file  . Attends Religious Services: Not on file  . Active Member of Clubs or Organizations: Not on file  . Attends Banker Meetings: Not on file  . Marital Status: Not on file   Family History  Problem Relation Age of Onset  . Heart disease Mother   . Breast cancer Sister    - negative except otherwise stated in the family history section No Known Allergies Prior to Admission medications   Medication Sig Start Date End Date Taking? Authorizing Provider  acetaminophen (TYLENOL) 325 MG tablet Take 2 tablets (650 mg total) by mouth every 6 (six) hours as needed for mild pain (or Fever >/= 101). 01/01/19  Yes Peggyann Shoals C, DO  amoxicillin-clavulanate  (AUGMENTIN) 875-125 MG tablet Take 1 tablet by mouth 2 (two) times daily. 12/21/19 01/20/20 Yes Vu, Tonita Phoenix, MD  atorvastatin (LIPITOR) 80 MG tablet TAKE 1 TABLET BY MOUTH  DAILY AT 6 PM Patient taking differently: Take 80 mg by mouth daily.  09/04/19  Yes [name], MD  cholecalciferol (VITAMIN D3) 25 MCG (1000 UT) tablet Take 1,000 Units by mouth at bedtime.   Yes [provider]  empagliflozin (JARDIANCE) 10 MG TABS tablet Take 1 tablet (10 mg total) by mouth daily. 10/30/19  Yes [name], MD  enalapril (VASOTEC) 20 MG tablet TAKE 1 TABLET BY MOUTH  DAILY 05/27/19  Yes Lockamy, Timothy, DO  gabapentin (NEURONTIN) 100 MG capsule TAKE 1 CAPSULE BY MOUTH  ONCE DAILY 11/13/19  Yes [name], MD  linagliptin (TRADJENTA) 5 MG TABS tablet Take 1 tablet (5 mg total) by mouth at bedtime. 10/30/19  Yes [name], MD  metoprolol succinate (TOPROL-XL) 25 MG 24 hr tablet Take 0.5 tablets (12.5 mg total) by mouth every evening. 06/26/19  Yes Lockamy, Timothy, DO  triamcinolone cream (KENALOG) 0.1 % APPLY TO AFFECTED AREA(S)  TOPICALLY TWICE DAILY Patient taking differently: Apply 1 application topically 2 (two) times daily as needed (dry skin).  10/24/19  Yes [name], MD  augmented betamethasone dipropionate (DIPROLENE-AF) 0.05 % cream APPLY TO AFFECTED AREA(S)  TOPICALLY TWICE DAILY AS  DIRECTED Patient not taking: Reported on 01/01/2020 10/24/19   [name], MD  docusate sodium (COLACE) 100 MG capsule Take 1 capsule (100 mg total) by mouth daily as needed for mild constipation. Patient not taking: Reported on 01/01/2020 01/01/19   Peggyann Shoals C, DO  glipiZIDE (GLUCOTROL) 10 MG tablet Take 1 tablet (10 mg total) by mouth at bedtime. Patient not taking: Reported on 12/21/2019 06/29/19   Arlyce Harman, DO  nitroGLYCERIN (NITRODUR - DOSED IN MG/24 HR) 0.2 mg/hr patch Place 1 patch (0.2 mg total) onto the skin daily. Apply to skin daily to different area of ankle and foot. Remove previous  patch Patient not taking: Reported on 01/01/2020 02/23/19   Persons, [place], PA  ondansetron (ZOFRAN) 4 MG tablet Take 1 tablet (4 mg total) by mouth every 8 (eight) hours as needed for nausea or vomiting. Patient not taking: Reported on 12/21/2019 01/01/19   Peggyann Shoals C, DO  oxyCODONE (OXY IR/ROXICODONE) 5 MG immediate release tablet Take 1 tablet (5 mg total) by mouth every 6 (six) hours as needed for severe pain (pain score 6-10). Patient not taking: Reported on 12/21/2019 01/01/19   Peggyann Shoals C, DO  pentoxifylline (TRENTAL)  400 MG CR tablet TAKE 1 TABLET BY MOUTH 3  TIMES DAILY WITH MEALS Patient not taking: Reported on 12/21/2019 11/28/19   Persons, [place], PA  PRODIGY NO CODING BLOOD GLUC test strip USE TO CHECK BLOOD SUGAR  THREE TIMES DAILY OR AS  DIRECTED 08/15/19   Arlyce Harman, DO  Prodigy Twist Top Lancets 28G MISC CHECK BLOOD SUGAR 3 TIMES  DAILY 06/15/19   Arlyce Harman, DO  atorvastatin (LIPITOR) 80 MG tablet Take 1 tablet (80 mg total) by mouth daily at 6 PM. 01/01/19   Dollene Cleveland, DO  cephALEXin (KEFLEX) 500 MG capsule Take 1 capsule (500 mg total) by mouth 3 (three) times daily for 16 days. 01/15/19   Peggyann Shoals C, DO  enalapril (VASOTEC) 20 MG tablet TAKE 1 TABLET BY MOUTH  DAILY Patient taking differently: Take 20 mg by mouth at bedtime.  07/04/18   Arlyce Harman, DO  triamcinolone cream (KENALOG) 0.1 % APPLY TO AFFECTED AREA(S)  TOPICALLY TWICE DAILY 02/13/19   Arlyce Harman, DO   DG Chest Port 1 View  Result Date: 01/01/2020 CLINICAL DATA:  Sepsis. EXAM: PORTABLE CHEST 1 VIEW COMPARISON:  12/25/2018 FINDINGS: The cardiac silhouette, mediastinal and hilar contours are within normal limits given the AP projection and portable technique. Mild stable tortuosity of the thoracic aorta. The lungs are clear. No pleural effusions. No pneumothorax. The bony thorax is intact. IMPRESSION: No acute cardiopulmonary findings. Electronically Signed    By: Rudie Meyer M.D.   On: 01/01/2020 14:34   DG Foot Complete Left  Result Date: 01/01/2020 CLINICAL DATA:  Plantar foot wound. EXAM: LEFT FOOT - COMPLETE 3+ VIEW COMPARISON:  Radiographs 11/02/2019 and MRI left foot 12/06/2019 FINDINGS: There is a new fracture involving the second metatarsal neck. The fourth metatarsal neck fracture seen on the prior x-rays demonstrates interval healing changes. Stable appearing MTP joint subluxations. First MTP joint is maintained. Remote amputation of third ray. The lateral film demonstrates a fairly extensive soft tissue defect involving the plantar aspect of the hindfoot. I do not see any obvious destructive bony changes involving the plantar cortex of the calcaneus. There is some mild lucency in the calcaneus posteriorly. Could not exclude osteomyelitis. IMPRESSION: 1. New fracture involving the second metatarsal neck. 2. Healing fourth metatarsal neck fracture. 3. Large soft tissue defect involving the plantar aspect of the hindfoot. 4. Mild lucency in the calcaneus posteriorly. Could not exclude osteomyelitis. Electronically Signed   By: Rudie Meyer M.D.   On: 01/01/2020 14:41   - pertinent xrays, CT, MRI studies were reviewed and independently interpreted  Positive ROS: All other systems have been reviewed and were otherwise negative with the exception of those mentioned in the HPI and as above.  Physical Exam: General: Alert, no acute distress Psychiatric: Patient is competent for consent with normal mood and affect Lymphatic: No axillary or cervical lymphadenopathy Cardiovascular: No pedal edema Respiratory: No cyanosis, no use of accessory musculature GI: No organomegaly, abdomen is soft and non-tender    Images:  @ENCIMAGES @  Labs:  Lab Results  Component Value Date   HGBA1C 5.3 01/01/2020   HGBA1C 5.4 09/26/2019   HGBA1C 7.0 05/04/2019   ESRSEDRATE 108 (H) 12/21/2019   CRP 35.5 (H) 12/21/2019   REPTSTATUS 12/12/2019 FINAL  12/08/2019   GRAMSTAIN NO WBC SEEN RARE GRAM POSITIVE COCCI  12/08/2019   CULT  12/08/2019    FEW GROUP B STREP(S.AGALACTIAE)ISOLATED TESTING AGAINST S. AGALACTIAE NOT ROUTINELY PERFORMED DUE TO PREDICTABILITY OF AMP/PEN/VAN SUSCEPTIBILITY.  WITHIN MIXED ORGANISMS RARE PREVOTELLA BIVIA BETA LACTAMASE POSITIVE Performed at [facility], [address] [place], [place] [address]    LABORGA ESCHERICHIA COLI 12/26/2018   LABORGA ALCALIGENES FAECALIS 12/26/2018   LABORGA STREPTOCOCCUS MITIS/ORALIS 12/26/2018    Lab Results  Component Value Date   ALBUMIN 2.0 (L) 01/02/2020   ALBUMIN 2.6 (L) 01/01/2020   ALBUMIN 2.2 (L) 12/27/2018    Neurologic: Patient does not have protective sensation bilateral lower extremities.   MUSCULOSKELETAL:   Skin: Examination patient has a ulcer over the left heel which probes down to bone.  The dorsal third ray amputation has healed well.  Patient has a chronic ulcer beneath the fourth metatarsal head.  Review of the radiographs shows destructive bony changes in the fourth metatarsal head as well as a pathologic fracture through the second metatarsal.  The MRI scan was obtained a month ago showed early osteomyelitis in the calcaneus as well as osteomyelitis of the fourth metatarsal.  Patient's Doppler studies show a biphasic dorsalis pedis and posterior tibial pulse.  Patient's albumin is 2.0.  Assessment: Assessment: Progressive osteomyelitis of the left calcaneus with progressive osteomyelitis in the left forefoot status post third ray amputation.  Plan: Discussed with the patient that we will need to proceed with a transtibial amputation risk and benefits were discussed including risk of the wound not healing need for additional surgery.  Patient states he understands wished to proceed at this time patient will need discharge to skilled nursing due to his living alone and being blind.  Patient may need prolonged skilled nursing care.   Patient will also need protein supplementation.  Thank you for the consult and the opportunity to see Mr. Zahn  Loyce Flaming, MD [facility] [phone] 7:30 AM     

## 2020-01-02 NOTE — Progress Notes (Signed)
PHARMACY - PHYSICIAN COMMUNICATION CRITICAL VALUE ALERT - BLOOD CULTURE IDENTIFICATION (BCID)  Chad Hall is an 69 y.o. male who presented to [facility] on 01/01/2020 with a chief complaint of weakness and lower extremity wound of left foot.  Assessment:  1/4 BCx growing GNR Enterobacterales (no organism specified yet)  Name of physician (or Provider) Contacted: Ava Swayze, DO  Current antibiotics: ceftriaxone 2g IV q24h and vancomycin 750mg  IVq12h  Changes to prescribed antibiotics recommended:   No changes recommended Consider de-escalation of regimen to discontinue vancomycin.   Results for orders placed or performed during the hospital encounter of 01/01/20  Blood Culture ID Panel (Reflexed) (Collected: 01/01/2020  2:50 PM)  Result Value Ref Range   Enterococcus faecalis NOT DETECTED NOT DETECTED   Enterococcus Faecium NOT DETECTED NOT DETECTED   Listeria monocytogenes NOT DETECTED NOT DETECTED   Staphylococcus species NOT DETECTED NOT DETECTED   Staphylococcus aureus (BCID) NOT DETECTED NOT DETECTED   Staphylococcus epidermidis NOT DETECTED NOT DETECTED   Staphylococcus lugdunensis NOT DETECTED NOT DETECTED   Streptococcus species NOT DETECTED NOT DETECTED   Streptococcus agalactiae NOT DETECTED NOT DETECTED   Streptococcus pneumoniae NOT DETECTED NOT DETECTED   Streptococcus pyogenes NOT DETECTED NOT DETECTED   A.calcoaceticus-baumannii NOT DETECTED NOT DETECTED   Bacteroides fragilis NOT DETECTED NOT DETECTED   Enterobacterales DETECTED (A) NOT DETECTED   Enterobacter cloacae complex NOT DETECTED NOT DETECTED   Escherichia coli NOT DETECTED NOT DETECTED   Klebsiella aerogenes NOT DETECTED NOT DETECTED   Klebsiella oxytoca NOT DETECTED NOT DETECTED   Klebsiella pneumoniae NOT DETECTED NOT DETECTED   Proteus species NOT DETECTED NOT DETECTED   Salmonella species NOT DETECTED NOT DETECTED   Serratia marcescens NOT DETECTED NOT DETECTED   Haemophilus influenzae NOT  DETECTED NOT DETECTED   Neisseria meningitidis NOT DETECTED NOT DETECTED   Pseudomonas aeruginosa NOT DETECTED NOT DETECTED   Stenotrophomonas maltophilia NOT DETECTED NOT DETECTED   Candida albicans NOT DETECTED NOT DETECTED   Candida auris NOT DETECTED NOT DETECTED   Candida glabrata NOT DETECTED NOT DETECTED   Candida krusei NOT DETECTED NOT DETECTED   Candida parapsilosis NOT DETECTED NOT DETECTED   Candida tropicalis NOT DETECTED NOT DETECTED   Cryptococcus neoformans/gattii NOT DETECTED NOT DETECTED   CTX-M ESBL NOT DETECTED NOT DETECTED   Carbapenem resistance IMP NOT DETECTED NOT DETECTED   Carbapenem resistance KPC NOT DETECTED NOT DETECTED   Carbapenem resistance NDM NOT DETECTED NOT DETECTED   Carbapenem resist OXA 48 LIKE NOT DETECTED NOT DETECTED   Carbapenem resistance VIM NOT DETECTED NOT DETECTED    01/03/2020, PharmD PGY1 Pharmacy Resident 01/02/2020 4:00 PM

## 2020-01-02 NOTE — Progress Notes (Signed)
PROGRESS NOTE  Chad Hall:[id number] DOB: 1950-07-23 DOA: 01/01/2020 PCP: [name], MD  Brief History    Chad Hall is a 69 y.o. male with medical history significant of blindness, chronic left lower extremity wound to his left foot, followed by wound center (Dr. Leanord Hawking,), hypertension, hyperlipidemia, type 2 diabetes mellitus, chronic kidney disease stage IIIa, presents to the hospital with chief complaint of weakness.  Patient tells me that he was in his normal state of health up until about 4-5 days ago (last Thursday), and since then he experienced progressive weakness, difficulties ambulating due to generalized fatigue, as well as "feeling hot" at times.  He denies any frank fever or chills.  He is seen in the wound center regularly and most recently saw Dr. Leanord Hawking about couple of weeks ago on 11/5 and he went some wound debridement.  He also has chronic osteomyelitis followed by infectious disease as an outpatient, most recently seen on 11/4 and he was placed on Augmentin for 6-10 weeks from 10/22 and he currently is still on that.  Patient also reports decreased appetite for the past 4 days and poor p.o. intake.  He denies any chest pain, denies any shortness of breath.  He denies any abdominal pain, did have an episode of nausea and vomiting on Thursday but nothing since.  ED Course: In the emergency room he has a low-grade temp of 99.1, tachycardic with heart rate into the low 100, hypertensive with blood pressure into the 170s, satting well on room air.  Blood work shows a white count of 13.4, BUN 49 creatinine 1.8.  Glucose is 143.  Chest x-ray is clear.  Foot x-ray shows a new fracture of the second metatarsal neck, healing fourth metatarsal neck fracture and a large soft tissue defect involving the plantar aspect of the hindfoot and possible osteomyelitis in the posterior calcaneus.  Dr. Lajoyce Corners with orthopedic surgery was consulted and plans to see patient in [facility],  likely needs surgery and we are asked to admit.  Dr. Lajoyce Corners has seen the patient. Pla is for transtibial amputation.  Timing is unknown.  Consultants  . Orthopedic surgery  Procedures  . none  Antibiotics   Anti-infectives (From admission, onward)   Start     Dose/Rate Route Frequency Ordered Stop   01/02/20 1400  cefTRIAXone (ROCEPHIN) 2 g in sodium chloride 0.9 % 100 mL IVPB        2 g 200 mL/hr over 30 Minutes Intravenous Every 24 hours 01/01/20 1626     01/02/20 0600  vancomycin (VANCOREADY) IVPB 750 mg/150 mL        750 mg 150 mL/hr over 60 Minutes Intravenous Every 12 hours 01/01/20 1652     01/01/20 1400  vancomycin (VANCOCIN) IVPB 1000 mg/200 mL premix        1,000 mg 200 mL/hr over 60 Minutes Intravenous  Once 01/01/20 1350 01/01/20 1620   01/01/20 1400  cefTRIAXone (ROCEPHIN) 2 g in sodium chloride 0.9 % 100 mL IVPB        2 g 200 mL/hr over 30 Minutes Intravenous  Once 01/01/20 1350 01/01/20 1519    .  Subjective  The patient is resting comfortably. No new complaints.  Objective   Vitals:  Vitals:   01/02/20 0910 01/02/20 1507  BP: (!) 114/57 116/62  Pulse: (!) 56 (!) 54  Resp: 17 17  Temp: 99.2 F (37.3 C) 98.1 F (36.7 C)  SpO2: 97% 97%    Exam:  Constitutional:  .  The patient is awake, alert, and oriented x 3. No acute distress. Respiratory:  . No increased work of breathing. . No wheezes, rales, or rhonchi . No tactile fremitus Cardiovascular:  . Regular rate and rhythm . No murmurs, ectopy, or gallups. . No lateral PMI. No thrills. Abdomen:  . Abdomen is soft, non-tender, non-distended . No hernias, masses, or organomegaly . Normoactive bowel sounds.  Musculoskeletal:  . No cyanosis, clubbing, or edema . Left foot is bandaged.  Skin:  . No rashes, lesions, ulcers . Skin is dry, cracked, scaly.  Neurologic:  . CN 2-12 intact . Sensation all 4 extremities intact Psychiatric:  . Mental status o Mood, affect appropriate o Orientation  to person, place, time  . judgment and insight appear intact  I have personally reviewed the following:   Today's Data  . Vitals, CMP, CBC  Micro Data  . 1/4 blood cultures is positive for Enterobacterales without mention of a specific organism. Further identification is to be expected from culture.   Imaging  . X-ray of left foot - OM  Scheduled Meds: . atorvastatin  80 mg Oral Daily  . cholecalciferol  1,000 Units Oral QHS  . gabapentin  100 mg Oral Daily  . hydrALAZINE  10 mg Oral Q8H  . insulin aspart  0-9 Units Subcutaneous TID WC  . metoprolol succinate  25 mg Oral Daily   Continuous Infusions: . sodium chloride 75 mL/hr at 01/02/20 1220  . cefTRIAXone (ROCEPHIN)  IV 2 g (01/02/20 1253)  . vancomycin 750 mg (01/02/20 1657)    Active Problems:   DM (diabetes mellitus), type 2 with neurological complications (HCC)   Hypertension   Hyperlipidemia   Marfan's syndrome   Diabetic peripheral neuropathy associated with type 2 diabetes mellitus (HCC)   AKI (acute kidney injury) (HCC)   Osteomyelitis of foot, left, acute (HCC)   Diabetic foot infection (HCC)   LOS: 1 day   A & P  Sepsis from infected left foot diabetes ulcer, acute on chronic osteomyelitis: Resolving.  Pt presented with tachypnea and tachycardia. He had an elevated creatinine of 1.82, and elevated WBC. Source of infection is the left foot. 1/4 blood cultures is positive for Enterobacterales without mention of a specific organism. Further identification is to be expected from culture. The patient is receiving IV Rocephin and Vancomycin.  Type 2 diabetes mellitus: Most recent A1c 6.4 but that was in 2020. Repeat HbA1c is 5.3. Sliding scale insulin only.   Acute kidney injury on chronic kidney disease stage IIIa: Baseline creatinine 1.3-1.4, currently on admission at 1.82. Improved to 1.42 this morning.  This is likely due to poor p.o. intake in the last few days in the setting of generalized weakness.   Provide IV fluids for the next 24 hours and recheck renal function in the morning -Hold home ACE inhibitor  Essential hypertension: Patient quite hypertensive in the ED, hold ACE inhibitor due to acute kidney injury, resume home Toprol. Will increase dose of hydralazine for improved blood pressure control.  Hyperlipidemia: Continue home statin  Normocytic anemia: Likely in the context of chronic illness, monitor hemoglobin, no bleeding.  Marfan syndrome, blindness: Noted  I have seen and examined this patient myself. I have spent 38 minutes in his evaluation and care.  DVT prophylaxis: SCDs  Code Status: Full code  Family Communication: no family at bedside  Disposition Plan: SNF  Cohen Doleman, DO [facility] Direct contact: see [email or web address]  7PM-7AM contact night coverage as above 01/02/2020, 6:00  PM  LOS: 1 day

## 2020-01-02 NOTE — Consult Note (Signed)
ORTHOPAEDIC CONSULTATION  REQUESTING PHYSICIAN: Swayze, Ava, DO  Chief Complaint: Progressive ulceration and pain left heel with forefoot ulceration as well status post foot salvage intervention with third ray amputation.  HPI: Chad Hall is a 69 y.o. male who presents with progressive ulceration drainage and pain left heel he is status post a third ray amputation where the dorsal incision has healed well.  Patient has a progressive ulcer beneath the fourth metatarsal head.  Past Medical History:  Diagnosis Date  . Allergy   . Blindness - both eyes 1967   Basketball injury  . Diabetes mellitus   . Hernia   . Hyperlipidemia   . Hypertension   . Marfan syndrome    Past Surgical History:  Procedure Laterality Date  . AMPUTATION Left 12/28/2018   Procedure: LEFT FOOT 3RD RAY AMPUTATION;  Surgeon: Nadara Mustard, MD;  Location: [facility] OR;  Service: Orthopedics;  Laterality: Left;  . APPENDECTOMY    . INGUINAL HERNIA REPAIR     Right 1970s,    Left 2011 by Dr Andrey Campanile  . INGUINAL HERNIA REPAIR  08/25/10   recurrent left  . PENILE PROSTHESIS IMPLANT  2000s   [facility], Dr Brunilda Payor  . RETINAL DETACHMENT SURGERY  1970  . Rotator Cuff Repair  2009   Left side, Dr Elita Quick   . ROTATOR CUFF REPAIR  2010-2011?   right side   Social History   Socioeconomic History  . Marital status: Married    Spouse name: JASMIN TRUMBULL  . Number of children: 0  . Years of education: 12th   . Highest education level: Not on file  Occupational History  . 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 Smoking status: Former Smoker    Quit date: 02/14/1984    Years since quitting: 35.9  . Smokeless tobacco: Never Used  Substance and Sexual Activity  . Alcohol use: No    Comment: Quit in 10/1983  . Drug use: No  . Sexual activity: Not on file  Other Topics Concern  . Not on file  Social History Narrative   Lives in [place] with wife, KAYHAN BOARDLEY    Blind.  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   Exercise: Pt has no regular exercise routine.   Seatbelts: Pt reports wearing seatbelt when in vehicles.    Wynelle Link Exposure/Protection:    Hobbies: Proofreader, walking, sports         Social Determinants of Corporate investment banker Strain:   . Difficulty of Paying Living Expenses: Not on file  Food Insecurity:   . Worried About Programme researcher, broadcasting/film/video in the Last Year: Not on file  . Ran Out of Food in the Last Year: Not on file  Transportation Needs:   . Lack of Transportation (Medical): Not on file  . Lack of Transportation (Non-Medical): Not on file  Physical Activity:   . Days of Exercise per Week: Not on file  . Minutes of Exercise per Session: Not on file  Stress:   . Feeling of Stress :  Not on file  Social Connections:   . Frequency of Communication with Friends and Family: Not on file  . Frequency of Social Gatherings with Friends and Family: Not on file  . Attends Religious Services: Not on file  . Active Member of Clubs or Organizations: Not on file  . Attends Banker Meetings: Not on file  . Marital Status: Not on file   Family History  Problem Relation Age of Onset  . Heart disease Mother   . Breast cancer Sister    - negative except otherwise stated in the family history section No Known Allergies Prior to Admission medications   Medication Sig Start Date End Date Taking? Authorizing Provider  acetaminophen (TYLENOL) 325 MG tablet Take 2 tablets (650 mg total) by mouth every 6 (six) hours as needed for mild pain (or Fever >/= 101). 01/01/19  Yes Peggyann Shoals C, DO  amoxicillin-clavulanate  (AUGMENTIN) 875-125 MG tablet Take 1 tablet by mouth 2 (two) times daily. 12/21/19 01/20/20 Yes Vu, Tonita Phoenix, MD  atorvastatin (LIPITOR) 80 MG tablet TAKE 1 TABLET BY MOUTH  DAILY AT 6 PM Patient taking differently: Take 80 mg by mouth daily.  09/04/19  Yes [name], MD  cholecalciferol (VITAMIN D3) 25 MCG (1000 UT) tablet Take 1,000 Units by mouth at bedtime.   Yes [provider]  empagliflozin (JARDIANCE) 10 MG TABS tablet Take 1 tablet (10 mg total) by mouth daily. 10/30/19  Yes [name], MD  enalapril (VASOTEC) 20 MG tablet TAKE 1 TABLET BY MOUTH  DAILY 05/27/19  Yes Lockamy, Timothy, DO  gabapentin (NEURONTIN) 100 MG capsule TAKE 1 CAPSULE BY MOUTH  ONCE DAILY 11/13/19  Yes [name], MD  linagliptin (TRADJENTA) 5 MG TABS tablet Take 1 tablet (5 mg total) by mouth at bedtime. 10/30/19  Yes [name], MD  metoprolol succinate (TOPROL-XL) 25 MG 24 hr tablet Take 0.5 tablets (12.5 mg total) by mouth every evening. 06/26/19  Yes Lockamy, Timothy, DO  triamcinolone cream (KENALOG) 0.1 % APPLY TO AFFECTED AREA(S)  TOPICALLY TWICE DAILY Patient taking differently: Apply 1 application topically 2 (two) times daily as needed (dry skin).  10/24/19  Yes [name], MD  augmented betamethasone dipropionate (DIPROLENE-AF) 0.05 % cream APPLY TO AFFECTED AREA(S)  TOPICALLY TWICE DAILY AS  DIRECTED Patient not taking: Reported on 01/01/2020 10/24/19   [name], MD  docusate sodium (COLACE) 100 MG capsule Take 1 capsule (100 mg total) by mouth daily as needed for mild constipation. Patient not taking: Reported on 01/01/2020 01/01/19   Peggyann Shoals C, DO  glipiZIDE (GLUCOTROL) 10 MG tablet Take 1 tablet (10 mg total) by mouth at bedtime. Patient not taking: Reported on 12/21/2019 06/29/19   Arlyce Harman, DO  nitroGLYCERIN (NITRODUR - DOSED IN MG/24 HR) 0.2 mg/hr patch Place 1 patch (0.2 mg total) onto the skin daily. Apply to skin daily to different area of ankle and foot. Remove previous  patch Patient not taking: Reported on 01/01/2020 02/23/19   Persons, [place], PA  ondansetron (ZOFRAN) 4 MG tablet Take 1 tablet (4 mg total) by mouth every 8 (eight) hours as needed for nausea or vomiting. Patient not taking: Reported on 12/21/2019 01/01/19   Peggyann Shoals C, DO  oxyCODONE (OXY IR/ROXICODONE) 5 MG immediate release tablet Take 1 tablet (5 mg total) by mouth every 6 (six) hours as needed for severe pain (pain score 6-10). Patient not taking: Reported on 12/21/2019 01/01/19   Peggyann Shoals C, DO  pentoxifylline (TRENTAL)  400 MG CR tablet TAKE 1 TABLET BY MOUTH 3  TIMES DAILY WITH MEALS Patient not taking: Reported on 12/21/2019 11/28/19   Persons, [place], PA  PRODIGY NO CODING BLOOD GLUC test strip USE TO CHECK BLOOD SUGAR  THREE TIMES DAILY OR AS  DIRECTED 08/15/19   Arlyce Harman, DO  Prodigy Twist Top Lancets 28G MISC CHECK BLOOD SUGAR 3 TIMES  DAILY 06/15/19   Arlyce Harman, DO  atorvastatin (LIPITOR) 80 MG tablet Take 1 tablet (80 mg total) by mouth daily at 6 PM. 01/01/19   Dollene Cleveland, DO  cephALEXin (KEFLEX) 500 MG capsule Take 1 capsule (500 mg total) by mouth 3 (three) times daily for 16 days. 01/15/19   Peggyann Shoals C, DO  enalapril (VASOTEC) 20 MG tablet TAKE 1 TABLET BY MOUTH  DAILY Patient taking differently: Take 20 mg by mouth at bedtime.  07/04/18   Arlyce Harman, DO  triamcinolone cream (KENALOG) 0.1 % APPLY TO AFFECTED AREA(S)  TOPICALLY TWICE DAILY 02/13/19   Arlyce Harman, DO   DG Chest Port 1 View  Result Date: 01/01/2020 CLINICAL DATA:  Sepsis. EXAM: PORTABLE CHEST 1 VIEW COMPARISON:  12/25/2018 FINDINGS: The cardiac silhouette, mediastinal and hilar contours are within normal limits given the AP projection and portable technique. Mild stable tortuosity of the thoracic aorta. The lungs are clear. No pleural effusions. No pneumothorax. The bony thorax is intact. IMPRESSION: No acute cardiopulmonary findings. Electronically Signed    By: Rudie Meyer M.D.   On: 01/01/2020 14:34   DG Foot Complete Left  Result Date: 01/01/2020 CLINICAL DATA:  Plantar foot wound. EXAM: LEFT FOOT - COMPLETE 3+ VIEW COMPARISON:  Radiographs 11/02/2019 and MRI left foot 12/06/2019 FINDINGS: There is a new fracture involving the second metatarsal neck. The fourth metatarsal neck fracture seen on the prior x-rays demonstrates interval healing changes. Stable appearing MTP joint subluxations. First MTP joint is maintained. Remote amputation of third ray. The lateral film demonstrates a fairly extensive soft tissue defect involving the plantar aspect of the hindfoot. I do not see any obvious destructive bony changes involving the plantar cortex of the calcaneus. There is some mild lucency in the calcaneus posteriorly. Could not exclude osteomyelitis. IMPRESSION: 1. New fracture involving the second metatarsal neck. 2. Healing fourth metatarsal neck fracture. 3. Large soft tissue defect involving the plantar aspect of the hindfoot. 4. Mild lucency in the calcaneus posteriorly. Could not exclude osteomyelitis. Electronically Signed   By: Rudie Meyer M.D.   On: 01/01/2020 14:41   - pertinent xrays, CT, MRI studies were reviewed and independently interpreted  Positive ROS: All other systems have been reviewed and were otherwise negative with the exception of those mentioned in the HPI and as above.  Physical Exam: General: Alert, no acute distress Psychiatric: Patient is competent for consent with normal mood and affect Lymphatic: No axillary or cervical lymphadenopathy Cardiovascular: No pedal edema Respiratory: No cyanosis, no use of accessory musculature GI: No organomegaly, abdomen is soft and non-tender    Images:  @ENCIMAGES @  Labs:  Lab Results  Component Value Date   HGBA1C 5.3 01/01/2020   HGBA1C 5.4 09/26/2019   HGBA1C 7.0 05/04/2019   ESRSEDRATE 108 (H) 12/21/2019   CRP 35.5 (H) 12/21/2019   REPTSTATUS 12/12/2019 FINAL  12/08/2019   GRAMSTAIN NO WBC SEEN RARE GRAM POSITIVE COCCI  12/08/2019   CULT  12/08/2019    FEW GROUP B STREP(S.AGALACTIAE)ISOLATED TESTING AGAINST S. AGALACTIAE NOT ROUTINELY PERFORMED DUE TO PREDICTABILITY OF AMP/PEN/VAN SUSCEPTIBILITY.  WITHIN MIXED ORGANISMS RARE PREVOTELLA BIVIA BETA LACTAMASE POSITIVE Performed at [facility], [address] [place], [place] [address]    [facility] 12/26/2018   LABORGA ALCALIGENES FAECALIS 12/26/2018   LABORGA STREPTOCOCCUS MITIS/ORALIS 12/26/2018    Lab Results  Component Value Date   ALBUMIN 2.0 (L) 01/02/2020   ALBUMIN 2.6 (L) 01/01/2020   ALBUMIN 2.2 (L) 12/27/2018    Neurologic: Patient does not have protective sensation bilateral lower extremities.   MUSCULOSKELETAL:   Skin: Examination patient has a ulcer over the left heel which probes down to bone.  The dorsal third ray amputation has healed well.  Patient has a chronic ulcer beneath the fourth metatarsal head.  Review of the radiographs shows destructive bony changes in the fourth metatarsal head as well as a pathologic fracture through the second metatarsal.  The MRI scan was obtained a month ago showed early osteomyelitis in the calcaneus as well as osteomyelitis of the fourth metatarsal.  Patient's Doppler studies show a biphasic dorsalis pedis and posterior tibial pulse.  Patient's albumin is 2.0.  Assessment: Assessment: Progressive osteomyelitis of the left calcaneus with progressive osteomyelitis in the left forefoot status post third ray amputation.  Plan: Discussed with the patient that we will need to proceed with a transtibial amputation risk and benefits were discussed including risk of the wound not healing need for additional surgery.  Patient states he understands wished to proceed at this time patient will need discharge to skilled nursing due to his living alone and being blind.  Patient may need prolonged skilled nursing care.   Patient will also need protein supplementation.  Thank you for the consult and the opportunity to see Chad Hall  Yousif Edelson, MD [facility] [phone] 7:30 AM

## 2020-01-03 ENCOUNTER — Encounter (HOSPITAL_COMMUNITY)
Admission: EM | Disposition: A | Discharge status: Skilled Nursing Facility | Payer: Self-pay | Source: Home / Self Care | Attending: Internal Medicine

## 2020-01-03 ENCOUNTER — Inpatient Hospital Stay (HOSPITAL_COMMUNITY): Payer: Medicare Other | Admitting: Certified Registered"

## 2020-01-03 ENCOUNTER — Encounter (HOSPITAL_BASED_OUTPATIENT_CLINIC_OR_DEPARTMENT_OTHER): Payer: Medicare Other | Admitting: Physician Assistant

## 2020-01-03 ENCOUNTER — Encounter (HOSPITAL_COMMUNITY): Payer: Self-pay | Admitting: Internal Medicine

## 2020-01-03 DIAGNOSIS — E11628 Type 2 diabetes mellitus with other skin complications: Secondary | ICD-10-CM

## 2020-01-03 DIAGNOSIS — L089 Local infection of the skin and subcutaneous tissue, unspecified: Secondary | ICD-10-CM

## 2020-01-03 DIAGNOSIS — M869 Osteomyelitis, unspecified: Secondary | ICD-10-CM | POA: Diagnosis not present

## 2020-01-03 HISTORY — PX: AMPUTATION: SHX166

## 2020-01-03 LAB — GLUCOSE, CAPILLARY
Glucose-Capillary: 74 mg/dL (ref 70–99)
Glucose-Capillary: 67 mg/dL — ABNORMAL LOW (ref 70–99)
Glucose-Capillary: 80 mg/dL (ref 70–99)
Glucose-Capillary: 99 mg/dL (ref 70–99)
Glucose-Capillary: 103 mg/dL — ABNORMAL HIGH (ref 70–99)
Glucose-Capillary: 103 mg/dL — ABNORMAL HIGH (ref 70–99)
Glucose-Capillary: 173 mg/dL — ABNORMAL HIGH (ref 70–99)

## 2020-01-03 LAB — COMPREHENSIVE METABOLIC PANEL
ALT: 41 U/L (ref 0–44)
AST: 78 U/L — ABNORMAL HIGH (ref 15–41)
Albumin: 1.9 g/dL — ABNORMAL LOW (ref 3.5–5.0)
Alkaline Phosphatase: 48 U/L (ref 38–126)
Anion gap: 8 (ref 5–15)
BUN: 36 mg/dL — ABNORMAL HIGH (ref 8–23)
CO2: 23 mmol/L (ref 22–32)
Calcium: 8.2 mg/dL — ABNORMAL LOW (ref 8.9–10.3)
Chloride: 112 mmol/L — ABNORMAL HIGH (ref 98–111)
Creatinine, Ser: 1.32 mg/dL — ABNORMAL HIGH (ref 0.61–1.24)
GFR, Estimated: 58 mL/min — ABNORMAL LOW (ref >60)
Glucose, Bld: 80 mg/dL (ref 70–99)
Potassium: 4.4 mmol/L (ref 3.5–5.1)
Sodium: 143 mmol/L (ref 135–145)
Total Bilirubin: 0.6 mg/dL (ref 0.3–1.2)
Total Protein: 5.9 g/dL — ABNORMAL LOW (ref 6.5–8.1)

## 2020-01-03 SURGERY — AMPUTATION BELOW KNEE
Anesthesia: General | Site: Knee | Laterality: Left

## 2020-01-03 MED ORDER — EPHEDRINE SULFATE-NACL 50-0.9 MG/10ML-% IV SOSY
PREFILLED_SYRINGE | INTRAVENOUS | Status: DC | PRN
  Administered 2020-01-03 (×2): 10 mg via INTRAVENOUS

## 2020-01-03 MED ORDER — PHENYLEPHRINE 40 MCG/ML (10ML) SYRINGE FOR IV PUSH (FOR BLOOD PRESSURE SUPPORT)
PREFILLED_SYRINGE | INTRAVENOUS | Status: DC | PRN
  Administered 2020-01-03: 120 ug via INTRAVENOUS
  Administered 2020-01-03: 80 ug via INTRAVENOUS

## 2020-01-03 MED ORDER — CEFAZOLIN SODIUM-DEXTROSE 2-4 GM/100ML-% IV SOLN
INTRAVENOUS | Status: AC
  Filled 2020-01-03: qty 100

## 2020-01-03 MED ORDER — ONDANSETRON HCL 4 MG/2ML IJ SOLN
INTRAMUSCULAR | Status: AC
  Filled 2020-01-03: qty 2

## 2020-01-03 MED ORDER — LIDOCAINE 2% (20 MG/ML) 5 ML SYRINGE
INTRAMUSCULAR | Status: DC | PRN
  Administered 2020-01-03: 100 mg via INTRAVENOUS

## 2020-01-03 MED ORDER — LACTATED RINGERS IV SOLN: INTRAVENOUS | Status: DC

## 2020-01-03 MED ORDER — CHLORHEXIDINE GLUCONATE 0.12 % MT SOLN: 15.0000 mL | Freq: Once | OROMUCOSAL | Status: AC

## 2020-01-03 MED ORDER — FENTANYL CITRATE (PF) 100 MCG/2ML IJ SOLN
INTRAMUSCULAR | Status: DC | PRN
  Administered 2020-01-03 (×2): 25 ug via INTRAVENOUS

## 2020-01-03 MED ORDER — FENTANYL CITRATE (PF) 100 MCG/2ML IJ SOLN
INTRAMUSCULAR | Status: AC
  Filled 2020-01-03: qty 2

## 2020-01-03 MED ORDER — DEXTROSE 50 % IV SOLN
INTRAVENOUS | Status: AC
  Administered 2020-01-03: 50 mL
  Filled 2020-01-03: qty 50

## 2020-01-03 MED ORDER — DEXAMETHASONE SODIUM PHOSPHATE 10 MG/ML IJ SOLN
INTRAMUSCULAR | Status: AC
  Filled 2020-01-03: qty 1

## 2020-01-03 MED ORDER — DOCUSATE SODIUM 100 MG PO CAPS
100.0000 mg | ORAL_CAPSULE | Freq: Two times a day (BID) | ORAL | Status: DC
  Administered 2020-01-03 – 2020-01-08 (×11): 100 mg via ORAL
  Filled 2020-01-03 (×12): qty 1

## 2020-01-03 MED ORDER — SODIUM CHLORIDE 0.9 % IV SOLN: INTRAVENOUS | Status: DC

## 2020-01-03 MED ORDER — VANCOMYCIN HCL IN DEXTROSE 1-5 GM/200ML-% IV SOLN
1000.0000 mg | Freq: Two times a day (BID) | INTRAVENOUS | Status: DC
  Filled 2020-01-03: qty 200

## 2020-01-03 MED ORDER — PROPOFOL 10 MG/ML IV BOLUS
INTRAVENOUS | Status: DC | PRN
  Administered 2020-01-03: 150 mg via INTRAVENOUS

## 2020-01-03 MED ORDER — FENTANYL CITRATE (PF) 250 MCG/5ML IJ SOLN
INTRAMUSCULAR | Status: AC
  Filled 2020-01-03: qty 5

## 2020-01-03 MED ORDER — 0.9 % SODIUM CHLORIDE (POUR BTL) OPTIME
TOPICAL | Status: DC | PRN
  Administered 2020-01-03: 1000 mL

## 2020-01-03 MED ORDER — ORAL CARE MOUTH RINSE: 15.0000 mL | Freq: Once | OROMUCOSAL | Status: AC

## 2020-01-03 MED ORDER — FENTANYL CITRATE (PF) 100 MCG/2ML IJ SOLN
25.0000 ug | INTRAMUSCULAR | Status: DC | PRN
  Administered 2020-01-03: 50 ug via INTRAVENOUS

## 2020-01-03 MED ORDER — CHLORHEXIDINE GLUCONATE 0.12 % MT SOLN
OROMUCOSAL | Status: AC
  Administered 2020-01-03: 15 mL via OROMUCOSAL
  Filled 2020-01-03: qty 15

## 2020-01-03 MED ORDER — HYDROMORPHONE HCL 1 MG/ML IJ SOLN: 0.5000 mg | INTRAMUSCULAR | Status: DC | PRN

## 2020-01-03 SURGICAL SUPPLY — 41 items
BLADE SAW RECIP 87.9 MT (BLADE) ×3 IMPLANT
BLADE SURG 21 STRL SS (BLADE) ×3 IMPLANT
BNDG COHESIVE 6X5 TAN STRL LF (GAUZE/BANDAGES/DRESSINGS) IMPLANT
CANISTER WOUND CARE 500ML ATS (WOUND CARE) ×3 IMPLANT
COVER SURGICAL LIGHT HANDLE (MISCELLANEOUS) ×3 IMPLANT
COVER WAND RF STERILE (DRAPES) IMPLANT
CUFF TOURN SGL QUICK 34 (TOURNIQUET CUFF) ×3
CUFF TRNQT CYL 34X4.125X (TOURNIQUET CUFF) ×1 IMPLANT
DRAPE DERMATAC (DRAPES) ×4 IMPLANT
DRAPE INCISE IOBAN 66X45 STRL (DRAPES) ×3 IMPLANT
DRAPE U-SHAPE 47X51 STRL (DRAPES) ×3 IMPLANT
DRESSING PREVENA PLUS CUSTOM (GAUZE/BANDAGES/DRESSINGS) ×1 IMPLANT
DRSG PREVENA PLUS CUSTOM (GAUZE/BANDAGES/DRESSINGS) ×3
DURAPREP 26ML APPLICATOR (WOUND CARE) ×3 IMPLANT
ELECT REM PT RETURN 9FT ADLT (ELECTROSURGICAL) ×3
ELECTRODE REM PT RTRN 9FT ADLT (ELECTROSURGICAL) ×1 IMPLANT
GLOVE BIOGEL PI IND STRL 9 (GLOVE) ×1 IMPLANT
GLOVE BIOGEL PI INDICATOR 9 (GLOVE) ×2
GLOVE SURG ORTHO 9.0 STRL STRW (GLOVE) ×3 IMPLANT
GOWN STRL REUS W/ TWL XL LVL3 (GOWN DISPOSABLE) ×2 IMPLANT
GOWN STRL REUS W/TWL XL LVL3 (GOWN DISPOSABLE) ×6
KIT BASIN OR (CUSTOM PROCEDURE TRAY) ×3 IMPLANT
KIT TURNOVER KIT B (KITS) ×3 IMPLANT
MANIFOLD NEPTUNE II (INSTRUMENTS) ×3 IMPLANT
NS IRRIG 1000ML POUR BTL (IV SOLUTION) ×3 IMPLANT
PACK ORTHO EXTREMITY (CUSTOM PROCEDURE TRAY) ×3 IMPLANT
PAD ARMBOARD 7.5X6 YLW CONV (MISCELLANEOUS) ×3 IMPLANT
PREVENA RESTOR ARTHOFORM 46X30 (CANNISTER) ×3 IMPLANT
SPONGE LAP 18X18 RF (DISPOSABLE) IMPLANT
STAPLER VISISTAT 35W (STAPLE) IMPLANT
STOCKINETTE IMPERVIOUS LG (DRAPES) ×3 IMPLANT
SUT ETHILON 2 0 PSLX (SUTURE) IMPLANT
SUT SILK 2 0 (SUTURE) ×3
SUT SILK 2-0 18XBRD TIE 12 (SUTURE) ×1 IMPLANT
SUT VIC AB 1 CT1 27 (SUTURE) ×6
SUT VIC AB 1 CT1 27XBRD ANBCTR (SUTURE) IMPLANT
SUT VIC AB 1 CTX 27 (SUTURE) ×6 IMPLANT
TOWEL GREEN STERILE (TOWEL DISPOSABLE) ×3 IMPLANT
TUBE CONNECTING 12'X1/4 (SUCTIONS) ×1
TUBE CONNECTING 12X1/4 (SUCTIONS) ×2 IMPLANT
YANKAUER SUCT BULB TIP NO VENT (SUCTIONS) ×3 IMPLANT

## 2020-01-03 NOTE — Progress Notes (Signed)
Pt with morganella bacteremia with sens pending. Ok to [facility] per Dr. Allena Katz.  Ulyses Southward, PharmD, BCIDP, AAHIVP, CPP Infectious Disease Pharmacist 01/03/2020 12:09 PM

## 2020-01-03 NOTE — Op Note (Signed)
° °  Date of Surgery: 01/03/2020  INDICATIONS: [name] is a 69 y.o.-year-old male who has PVD diabetes and osteomyelitis left foot.Marland Kitchen  PREOPERATIVE DIAGNOSIS: osteomyelitis left foot  POSTOPERATIVE DIAGNOSIS: Same.  PROCEDURE: Transtibial amputation Application of Prevena wound VAC  SURGEON: Lajoyce Corners, M.D.  ANESTHESIA:  general  IV FLUIDS AND URINE: See anesthesia.  ESTIMATED BLOOD LOSS: 100 mL.  COMPLICATIONS: None.  DESCRIPTION OF PROCEDURE: The patient was brought to the operating room and underwent a general anesthetic. After adequate levels of anesthesia were obtained patient's lower extremity was prepped using DuraPrep draped into a sterile field. A timeout was called. The foot was draped out of the sterile field with impervious stockinette. A transverse incision was made 11 cm distal to the tibial tubercle. This curved proximally and a large posterior flap was created. The tibia was transected 1 cm proximal to the skin incision. The fibula was transected just proximal to the tibial incision. The tibia was beveled anteriorly. A large posterior flap was created. The sciatic nerve was pulled cut and allowed to retract. The vascular bundles were suture ligated with 2-0 silk. The deep and superficial fascial layers were closed using #1 Vicryl. The skin was closed using staples and 2-0 nylon. The wound was covered with a Prevena wound VAC. There was a good suction fit. A prosthetic shrinker was applied. Patient was extubated taken to the PACU in stable condition.   DISCHARGE PLANNING:  Antibiotic duration:24 hours  Weightbearing: NWB left  Pain medication: opiod pathway  Dressing care/ Wound VAC:VAC for 1 week  Discharge to: SNF  Follow-up: In the office 1 week post operative.  Aldean Baker, MD [facility] 3:06 PM

## 2020-01-03 NOTE — Progress Notes (Signed)
Pt blood sugar 66, 1/2 amp of D50 Given to the patient.

## 2020-01-03 NOTE — Anesthesia Preprocedure Evaluation (Addendum)
Anesthesia Evaluation  Patient identified by MRN, date of birth, ID band Patient awake    Reviewed: Allergy & Precautions, NPO status , Patient's Chart, lab work & pertinent test results  Airway Mallampati: II  TM Distance: >3 FB     Dental   Pulmonary neg pulmonary ROS, former smoker,    breath sounds clear to auscultation       Cardiovascular hypertension,  Rhythm:Regular Rate:Normal     Neuro/Psych  Neuromuscular disease    GI/Hepatic negative GI ROS, Neg liver ROS,   Endo/Other  diabetes  Renal/GU Renal disease     Musculoskeletal   Abdominal   Peds  Hematology   Anesthesia Other Findings   Reproductive/Obstetrics                             Anesthesia Physical Anesthesia Plan  ASA: III  Anesthesia Plan: General   Post-op Pain Management:    Induction: Intravenous  PONV Risk Score and Plan: 3 and Ondansetron, Dexamethasone and Midazolam  Airway Management Planned: LMA  Additional Equipment:   Intra-op Plan:   Post-operative Plan:   Informed Consent: I have reviewed the patients History and Physical, chart, labs and discussed the procedure including the risks, benefits and alternatives for the proposed anesthesia with the patient or authorized representative who has indicated his/her understanding and acceptance.     Dental advisory given  Plan Discussed with: CRNA and Anesthesiologist  Anesthesia Plan Comments:         Anesthesia Quick Evaluation

## 2020-01-03 NOTE — Progress Notes (Signed)
Orthopedic Tech Progress Note Patient Details:  Chad Hall 08/06/1950 [id number] Called in order to [facility] for a VIVE PROTOCOL BK Patient ID: Chad Hall, male   DOB: 1950-03-11, 69 y.o.   MRN: [id number]   [name] 01/03/2020, 4:11 PM

## 2020-01-03 NOTE — Transfer of Care (Signed)
Immediate Anesthesia Transfer of Care Note  Patient: Chad Hall  Procedure(s) Performed: LEFT BELOW KNEE AMPUTATION (Left Knee)  Patient Location: PACU  Anesthesia Type:General  Level of Consciousness: drowsy and patient cooperative  Airway & Oxygen Therapy: Patient Spontanous Breathing  Post-op Assessment: Report given to RN and Post -op Vital signs reviewed and stable  Post vital signs: Reviewed and stable  Last Vitals:  Vitals Value Taken Time  BP 149/71 01/03/20 1455  Temp    Pulse 93 01/03/20 1457  Resp 15 01/03/20 1457  SpO2 100 % 01/03/20 1457  Vitals shown include unvalidated device data.  Last Pain:  Vitals:   01/03/20 1105  TempSrc: Oral  PainSc:          Complications: No complications documented.

## 2020-01-03 NOTE — Anesthesia Postprocedure Evaluation (Signed)
Anesthesia Post Note  Patient: Chad Hall  Procedure(s) Performed: LEFT BELOW KNEE AMPUTATION (Left Knee)     Patient location during evaluation: PACU Anesthesia Type: General Level of consciousness: awake Pain management: pain level controlled Vital Signs Assessment: post-procedure vital signs reviewed and stable Respiratory status: spontaneous breathing Cardiovascular status: stable Postop Assessment: no apparent nausea or vomiting Anesthetic complications: no   No complications documented.  Last Vitals:  Vitals:   01/03/20 1105 01/03/20 1500  BP: (!) 143/61 (!) 149/71  Pulse: (!) 56 61  Resp:  15  Temp: 36.6 C 36.5 C  SpO2: 100% 98%    Last Pain:  Vitals:   01/03/20 1500  TempSrc:   PainSc: 9                  Amarisa Wilinski

## 2020-01-03 NOTE — Progress Notes (Signed)
PROGRESS NOTE  Chad Hall ZOX:[id number] DOB: 12/21/1950 DOA: 01/01/2020 PCP: [name], MD  Brief History    Chad Hall is a 69 y.o. male with medical history significant of blindness, chronic left lower extremity wound to his left foot, followed by wound center (Dr. Leanord Hawking,), hypertension, hyperlipidemia, type 2 diabetes mellitus, chronic kidney disease stage IIIa, presents to the hospital with chief complaint of weakness.  Patient tells me that he was in his normal state of health up until about 4-5 days ago (last Thursday), and since then he experienced progressive weakness, difficulties ambulating due to generalized fatigue, as well as "feeling hot" at times.  He denies any frank fever or chills.  He is seen in the wound center regularly and most recently saw Dr. Leanord Hawking about couple of weeks ago on 11/5 and he went some wound debridement.  He also has chronic osteomyelitis followed by infectious disease as an outpatient, most recently seen on 11/4 and he was placed on Augmentin for 6-10 weeks from 10/22 and he currently is still on that.  Patient also reports decreased appetite for the past 4 days and poor p.o. intake.  He denies any chest pain, denies any shortness of breath.  He denies any abdominal pain, did have an episode of nausea and vomiting on Thursday but nothing since.  ED Course: In the emergency room he has a low-grade temp of 99.1, tachycardic with heart rate into the low 100, hypertensive with blood pressure into the 170s, satting well on room air.  Blood work shows a white count of 13.4, BUN 49 creatinine 1.8.  Glucose is 143.  Chest x-ray is clear.  Foot x-ray shows a new fracture of the second metatarsal neck, healing fourth metatarsal neck fracture and a large soft tissue defect involving the plantar aspect of the hindfoot and possible osteomyelitis in the posterior calcaneus.  Dr. Lajoyce Corners with orthopedic surgery was consulted and plans to see patient in [facility],  likely needs surgery and we are asked to admit.  Dr. Lajoyce Corners has seen the patient. Pla is for transtibial amputation.  Timing is unknown.  Consultants  . Orthopedic surgery  Procedures  . none  Subjective  No nausea no vomiting.  Pain well controlled.  No fever no chills.  Objective   Vitals:  Vitals:   01/03/20 1628 01/03/20 1708  BP: (!) 155/59 (!) 155/83  Pulse: (!) 56 64  Resp: 17   Temp: (!) 97.5 F (36.4 C)   SpO2: 99% 100%    Exam: General: Appear in mild distress, no Rash; Oral Mucosa Clear, moist. no Abnormal Neck Mass Or lumps, Conjunctiva normal  Cardiovascular: S1 and S2 Present, no Murmur, Respiratory: good respiratory effort, Bilateral Air entry present and CTA, no Crackles, no wheezes Abdomen: Bowel Sound present, Soft and no tenderness Extremities: no Pedal edema Neurology: alert and oriented to time, place, and person affect appropriate. no new focal deficit Gait not checked due to patient safety concerns  I have personally reviewed the following:   Today's Data  . Vitals, CMP, CBC  Micro Data  . 1/4 blood cultures is positive for Enterobacterales without mention of a specific organism. Further identification is to be expected from culture.   Imaging  . X-ray of left foot - OM  Scheduled Meds: . atorvastatin  80 mg Oral Daily  . cholecalciferol  1,000 Units Oral QHS  . docusate sodium  100 mg Oral BID  . gabapentin  100 mg Oral Daily  .  hydrALAZINE  25 mg Oral Q6H  . insulin aspart  0-9 Units Subcutaneous TID WC   Continuous Infusions: . sodium chloride 75 mL/hr at 01/03/20 1611  . cefTRIAXone (ROCEPHIN)  IV 2 g (01/02/20 1253)    Active Problems:   DM (diabetes mellitus), type 2 with neurological complications (HCC)   Hypertension   Hyperlipidemia   Marfan's syndrome   Diabetic peripheral neuropathy associated with type 2 diabetes mellitus (HCC)   AKI (acute kidney injury) (HCC)   Osteomyelitis of foot, left, acute (HCC)   Diabetic  foot infection (HCC)   LOS: 2 days   A & P  Sepsis from infected left foot diabetes ulcer, acute on chronic osteomyelitis: Resolving.  Pt presented with tachypnea and tachycardia. He had an elevated creatinine of 1.82, and elevated WBC. Source of infection is the left foot. 1/4 blood cultures is positive for Enterobacterales without mention of a specific organism. Further identification is to be expected from culture. The patient is receiving IV Rocephin and Vancomycin.  Will discontinue vancomycin for now. Awaiting sensitivities. We will discuss with ID tomorrow regarding options.  Type 2 diabetes mellitus: Most recent A1c 6.4 but that was in 2020. Repeat HbA1c is 5.3. Sliding scale insulin only.   Acute kidney injury on chronic kidney disease stage IIIa: Baseline creatinine 1.3-1.4, currently on admission at 1.82. Improved to 1.42 this morning.  This is likely due to poor p.o. intake in the last few days in the setting of generalized weakness.  Provide IV fluids for the next 24 hours and recheck renal function in the morning -Hold home ACE inhibitor  Essential hypertension: Patient quite hypertensive in the ED, hold ACE inhibitor due to acute kidney injury, resume home Toprol. Will increase dose of hydralazine for improved blood pressure control.  Hyperlipidemia: Continue home statin  Normocytic anemia: Likely in the context of chronic illness, monitor hemoglobin, no bleeding.  Marfan syndrome, blindness: Noted  I have seen and examined this patient myself. I have spent 38 minutes in his evaluation and care.  DVT prophylaxis: SCDs  Code Status: Full code  Family Communication: no family at bedside  Disposition Plan: SNF Author:  Lynden Oxford, MD [facility] 01/03/2020  8:00 PM   To reach On-call, see care teams to locate the attending and reach out to them via [email or web address]. If 7PM-7AM, please contact night-coverage If you still have difficulty reaching the  attending provider, please page the [facility] (Director on Call) for [facility] on amion for assistance.

## 2020-01-03 NOTE — Progress Notes (Signed)
Pharmacy Antibiotic Note  Chad Hall is a 69 y.o. male admitted on 01/01/2020 with weakness and "feeling hot."  Pharmacy has been consulted for vancomycin dosing for osteomyelitis of the left calcaneous/forefoot post 3rd ray amputation.  Patient is also on Rocephin for GNR in blood culture - speciation in process.  Renal function improving, afebrile, WBC normalized, LA 1.  Plan: Increase vanc to 1000mg  IV Q12H for goal trough 15-20 mcg/mL Continue CTX 2gm IV Q24H per MD Monitor renal fxn, micro data, vanc trough at Css if continued on therapy   Temp (24hrs), Avg:98.3 F (36.8 C), Min:97.4 F (36.3 C), Max:99.2 F (37.3 C)  Recent Labs  Lab 01/01/20 1435 01/01/20 1550 01/02/20 0450 01/03/20 0211  WBC 13.4*  --  10.1  --   CREATININE 1.82*  --  1.46* 1.32*  LATICACIDVEN 1.3 1.0  --   --     Estimated Creatinine Clearance: 64.7 mL/min (A) (by C-G formula based on SCr of 1.32 mg/dL (H)).    No Known Allergies  Vanc 11/15 >> CTX 11/15 >>  11/15 UCx - negative 11/16 BCx - 1/2 GNR (BCID Enterobacterales) 11/16 surgical PCR - negative  Marria Mathison D. 12/16, PharmD, BCPS, BCCCP 01/03/2020, 8:59 AM

## 2020-01-03 NOTE — Interval H&P Note (Signed)
History and Physical Interval Note:  01/03/2020 1:28 PM  Chad Hall  has presented today for surgery, with the diagnosis of Osteomyelitis Left Calcaneous.  The various methods of treatment have been discussed with the patient and family. After consideration of risks, benefits and other options for treatment, the patient has consented to  Procedure(s): LEFT BELOW KNEE AMPUTATION (Left) as a surgical intervention.  [name] history has been reviewed, patient examined, no change in status, stable for surgery.  I have reviewed the patient's chart and labs.  Questions were answered to the patient's satisfaction.     Nadara Mustard

## 2020-01-03 NOTE — Anesthesia Procedure Notes (Signed)
Procedure Name: LMA Insertion Date/Time: 01/03/2020 2:18 PM Performed by: Marny Lowenstein, CRNA Pre-anesthesia Checklist: Patient identified, Emergency Drugs available, Suction available and Patient being monitored Patient Re-evaluated:Patient Re-evaluated prior to induction Oxygen Delivery Method: Circle system utilized Preoxygenation: Pre-oxygenation with 100% oxygen Induction Type: IV induction Ventilation: Mask ventilation without difficulty LMA Size: 5.0 Number of attempts: 1 Airway Equipment and Method: Patient positioned with wedge pillow Placement Confirmation: positive ETCO2 and breath sounds checked- equal and bilateral Tube secured with: Tape Dental Injury: Teeth and Oropharynx as per pre-operative assessment

## 2020-01-04 ENCOUNTER — Encounter (HOSPITAL_COMMUNITY): Payer: Self-pay | Admitting: Orthopedic Surgery

## 2020-01-04 DIAGNOSIS — M869 Osteomyelitis, unspecified: Secondary | ICD-10-CM | POA: Diagnosis not present

## 2020-01-04 LAB — CBC
HCT: 30.2 % — ABNORMAL LOW (ref 39.0–52.0)
Hemoglobin: 8.9 g/dL — ABNORMAL LOW (ref 13.0–17.0)
MCH: 28.2 pg (ref 26.0–34.0)
MCHC: 29.5 g/dL — ABNORMAL LOW (ref 30.0–36.0)
MCV: 95.6 fL (ref 80.0–100.0)
Platelets: 347 10*3/uL (ref 150–400)
RBC: 3.16 MIL/uL — ABNORMAL LOW (ref 4.22–5.81)
RDW: 13.2 % (ref 11.5–15.5)
WBC: 7.4 10*3/uL (ref 4.0–10.5)
nRBC: 0 % (ref 0.0–0.2)

## 2020-01-04 LAB — CULTURE, BLOOD (ROUTINE X 2): Special Requests: ADEQUATE

## 2020-01-04 LAB — BASIC METABOLIC PANEL
Anion gap: 11 (ref 5–15)
BUN: 34 mg/dL — ABNORMAL HIGH (ref 8–23)
CO2: 20 mmol/L — ABNORMAL LOW (ref 22–32)
Calcium: 8.3 mg/dL — ABNORMAL LOW (ref 8.9–10.3)
Chloride: 113 mmol/L — ABNORMAL HIGH (ref 98–111)
Creatinine, Ser: 1.27 mg/dL — ABNORMAL HIGH (ref 0.61–1.24)
GFR, Estimated: >60 mL/min (ref >60)
Glucose, Bld: 128 mg/dL — ABNORMAL HIGH (ref 70–99)
Potassium: 4.7 mmol/L (ref 3.5–5.1)
Sodium: 144 mmol/L (ref 135–145)

## 2020-01-04 LAB — GLUCOSE, CAPILLARY
Glucose-Capillary: 143 mg/dL — ABNORMAL HIGH (ref 70–99)
Glucose-Capillary: 105 mg/dL — ABNORMAL HIGH (ref 70–99)
Glucose-Capillary: 155 mg/dL — ABNORMAL HIGH (ref 70–99)
Glucose-Capillary: 149 mg/dL — ABNORMAL HIGH (ref 70–99)

## 2020-01-04 MED ORDER — CIPROFLOXACIN HCL 500 MG PO TABS
500.0000 mg | ORAL_TABLET | Freq: Two times a day (BID) | ORAL | Status: DC
  Administered 2020-01-04 – 2020-01-08 (×10): 500 mg via ORAL
  Filled 2020-01-04 (×10): qty 1

## 2020-01-04 NOTE — Care Management Important Message (Signed)
Important Message  Patient Details  Name: Chad Hall MRN: [id number] Date of Birth: 02-Jun-1950   Medicare Important Message Given:  Yes     [name] Suheyb Raucci 01/04/2020, 2:58 PM

## 2020-01-04 NOTE — Progress Notes (Signed)
[facility] Progress Note  Patient: Chad Hall    AST:[id number]  DOA: 01/01/2020     Date of Service: the patient was seen and examined on 01/04/2020  Brief hospital course: Past medical history of chronic left leg wound, HTN, HLD, type II DM, CKD 3A, blindness.  Presents with complaints of weakness found to have Enterobacter bacteremia as well as osteomyelitis of the foot.  Started on IV biotics.  On chronic Augmentin.  SP BKA and wound VAC placement. Currently plan is continue antibiotics.  Assessment and Plan: Sepsis from infected left foot diabetes ulcer, POA. acute on chronic osteomyelitis:  Morganella bacteremia. Sepsis physiology resolved. Pt presented with tachypnea and tachycardia.  He had an elevated creatinine of 1.82, and elevated WBC.  Source of infection is the left foot.  1/4 blood cultures is positive for Morganella  Was receiving IV Rocephin and Vancomycin.   Now on oral Cipro We will discuss with ID tomorrow regarding duration.  Type 2 diabetes mellitus Controlled with renal complication. Most recent A1c 6.4 but that was in 2020. Repeat HbA1c is 5.3. Sliding scale insulin only.   Acute kidney injury on chronic kidney disease stage IIIa: Baseline creatinine 1.3-1.4. currently on admission at 1.82.  Improved this morning. This is likely due to poor p.o. intake in the last few days in the setting of generalized weakness.  Provided IV fluids for the next 24 hours and recheck renal function in the morning -Hold home ACE inhibitor  Essential hypertension:  Patient quite hypertensive in the ED,  hold ACE inhibitor due to acute kidney injury, resume home Toprol.  Will increase dose of hydralazine for improved blood pressure control.  Hyperlipidemia:  Continue home statin  Normocytic anemia:  Likely in the context of chronic illness, monitor hemoglobin, no bleeding.  Marfan syndrome,  Blindness Patient will need SNF on discharge given his  inability to perform dressing changes as well as living alone at home and having a BKA.  Diet: Regular diet DVT Prophylaxis:   SCDs Start: 01/03/20 1558 SCDs Start: 01/01/20 2259   Advance goals of care discussion: Full code  Family Communication: no family was present at bedside, at the time of interview.   Disposition:  Status is: Inpatient  Remains inpatient appropriate because:IV treatments appropriate due to intensity of illness or inability to take PO  Dispo: The patient is from: Home              Anticipated d/c is to: SNF              Anticipated d/c date is: 2 days              Patient currently is not medically stable to d/c.  Subjective: No nausea no vomiting.  No fever no chills.  Pain well controlled.  No diarrhea.  Physical Exam:  General: Appear in mild distress, no Rash; Oral Mucosa Clear, moist. no Abnormal Neck Mass Or lumps, Conjunctiva normal  Cardiovascular: S1 and S2 Present, no Murmur, Respiratory: good respiratory effort, Bilateral Air entry present and CTA, no Crackles, no wheezes Abdomen: Bowel Sound present, Soft and no tenderness Extremities: Left leg BKA, no pedal edema Neurology: alert and oriented to time, place, and person affect appropriate. no new focal deficit Gait not checked due to patient safety concerns  Vitals:   01/03/20 2357 01/04/20 0300 01/04/20 0938 01/04/20 1604  BP: (!) 143/69 (!) 142/66 116/62 (!) 117/57  Pulse: 70 71 86 93  Resp: 16 16 20  18  Temp: 98.3 F (36.8 C) 98 F (36.7 C) 97.8 F (36.6 C) 97.8 F (36.6 C)  TempSrc:  Oral Oral Oral  SpO2: 97% 96% 99% 98%  Weight:      Height:        Intake/Output Summary (Last 24 hours) at 01/04/2020 1848 Last data filed at 01/04/2020 1606 Gross per 24 hour  Intake 1150.43 ml  Output 350 ml  Net 800.43 ml   Filed Weights   01/01/20 2301 01/03/20 1310  Weight: 90.7 kg 90.7 kg    Data Reviewed: I have personally reviewed and interpreted daily labs, tele strips,  imagings as discussed above. I reviewed all nursing notes, pharmacy notes, vitals, pertinent old records I have discussed plan of care as described above with RN and patient/family.  CBC: Recent Labs  Lab 01/01/20 1435 01/02/20 0450 01/04/20 0411  WBC 13.4* 10.1 7.4  NEUTROABS 11.6*  --   --   HGB 9.5* 9.4* 8.9*  HCT 31.4* 30.6* 30.2*  MCV 96.0 94.7 95.6  PLT 336 289 347   Basic Metabolic Panel: Recent Labs  Lab 01/01/20 1435 01/02/20 0450 01/03/20 0211 01/04/20 0411  NA 139 142 143 144  K 4.1 4.3 4.4 4.7  CL 104 110 112* 113*  CO2 23 21* 23 20*  GLUCOSE 143* 89 80 128*  BUN 49* 40* 36* 34*  CREATININE 1.82* 1.46* 1.32* 1.27*  CALCIUM 8.2* 8.3* 8.2* 8.3*    Studies: No results found.  Scheduled Meds:  atorvastatin  80 mg Oral Daily   cholecalciferol  1,000 Units Oral QHS   ciprofloxacin  500 mg Oral BID   docusate sodium  100 mg Oral BID   gabapentin  100 mg Oral Daily   hydrALAZINE  25 mg Oral Q6H   insulin aspart  0-9 Units Subcutaneous TID WC   Continuous Infusions: PRN Meds: acetaminophen **OR** acetaminophen, HYDROmorphone (DILAUDID) injection, ondansetron **OR** ondansetron (ZOFRAN) IV, oxyCODONE  Time spent: 35 minutes  Author: Lynden Oxford, MD [facility] 01/04/2020 6:48 PM  To reach On-call, see care teams to locate the attending and reach out via [email or web address]. Between 7PM-7AM, please contact night-coverage If you still have difficulty reaching the attending provider, please page the [facility] (Director on Call) for [facility] on amion for assistance.

## 2020-01-04 NOTE — Progress Notes (Signed)
Pharmacy Antibiotic Note  Chad Hall is a 69 y.o. male admitted on 01/01/2020 with bacteremia.  Pharmacy has been consulted for Cipro dosing. CrCl - 67 ml/min.  Plan: Cipro 500 mg po bid Monitor renal function and cultures  Height: 6\' 5"  (195.6 cm) Weight: 90.7 kg (199 lb 15.3 oz) IBW/kg (Calculated) : 89.1  Temp (24hrs), Avg:97.9 F (36.6 C), Min:97.5 F (36.4 C), Max:98.3 F (36.8 C)  Recent Labs  Lab 01/01/20 1435 01/01/20 1550 01/02/20 0450 01/03/20 0211 01/04/20 0411  WBC 13.4*  --  10.1  --  7.4  CREATININE 1.82*  --  1.46* 1.32* 1.27*  LATICACIDVEN 1.3 1.0  --   --   --     Estimated Creatinine Clearance: 67.2 mL/min (A) (by C-G formula based on SCr of 1.27 mg/dL (H)).    No Known Allergies  Antimicrobials this admission: Cipro 11/18 >>   Thank you for allowing pharmacy to be a part of this patient's care.  12/18, PharmD, [facility] Clinical Pharmacist Please see AMION for all Pharmacists' Contact Phone Numbers 01/04/2020, 8:18 AM

## 2020-01-04 NOTE — Progress Notes (Signed)
Patient is postop day 1 status post below-knee amputation of the left.  He is laying in bed states he is comfortable eating breakfast   Vital signs stable wound VAC does not plug-in or turned on.  Discussed with nursing.  0 cc in the canister

## 2020-01-04 NOTE — TOC Initial Note (Signed)
Transition of Care [facility]) - Initial/Assessment Note    Patient Details  Name: Chad Hall MRN: [id number] Date of Birth: 23-Jun-1950  Transition of Care [facility]) CM/SW Contact:    Janae Bridgeman, RN Phone Number: 01/04/2020, 12:56 PM  Clinical Narrative:                 Case management spoke with the patient at the bedside regarding patient transitions needs.  The patient lives with a spouse and service dog for assistance.  The patient states that he will not be able to utilize his blind cane and service dog post operatively active his surgery.  The patient is agreeable to CIR placement and I will continue to follow the patient for discharge needs.  Patient is fully vaccinated for COVID through [employer] x 2.  Will continue to follow for discharge needs.   Expected Discharge Plan: IP Rehab Facility Barriers to Discharge: Continued Medical Work up   Patient Goals and CMS Choice Patient states their goals for this hospitalization and ongoing recovery are:: Patient plans to discharge to CIR for rehabilitation. CMS Medicare.gov Compare Post Acute Care list provided to:: Patient Choice offered to / list presented to : Patient  Expected Discharge Plan and Services Expected Discharge Plan: IP Rehab Facility   Discharge Planning Services: CM Consult Post Acute Care Choice: IP Rehab Living arrangements for the past 2 months: Single Family Home                                      Prior Living Arrangements/Services Living arrangements for the past 2 months: Single Family Home Lives with:: Spouse Patient language and need for interpreter reviewed:: Yes Do you feel safe going back to the place where you live?: Yes      Need for Family Participation in Patient Care: Yes (Comment) Care giver support system in place?: Yes (comment) Current home services: Home RN, DME (current services with service dog at home, folding blind cane) Criminal Activity/Legal Involvement  Pertinent to Current Situation/Hospitalization: No - Comment as needed  Activities of Daily Living Home Assistive Devices/Equipment: Cane (specify quad or straight), Shower chair with back, Other (Comment) (black medical shoe for left foot) ADL Screening (condition at time of admission) Patient's cognitive ability adequate to safely complete daily activities?: Yes Is the patient deaf or have difficulty hearing?: No Does the patient have difficulty seeing, even when wearing glasses/contacts?: Yes (blind in both eyes) Does the patient have difficulty concentrating, remembering, or making decisions?: No Patient able to express need for assistance with ADLs?: Yes Does the patient have difficulty dressing or bathing?: No Independently performs ADLs?: No Communication: Independent Dressing (OT): Independent Grooming: Independent Feeding: Independent Bathing: Independent Toileting: Needs assistance Is this a change from baseline?: Change from baseline, expected to last >3days In/Out Bed: Needs assistance Is this a change from baseline?: Change from baseline, expected to last >3 days Walks in Home: Needs assistance Is this a change from baseline?: Change from baseline, expected to last >3 days Does the patient have difficulty walking or climbing stairs?: Yes Weakness of Legs: Both Weakness of Arms/Hands: Both  Permission Sought/Granted Permission sought to share information with : Case Manager Permission granted to share information with : Yes, Verbal Permission Granted     Permission granted to share info w AGENCY: CIR  Permission granted to share info w Relationship: Lynden Ang, wife     Emotional Assessment  Appearance:: Appears stated age Attitude/Demeanor/Rapport: Engaged Affect (typically observed): Accepting Orientation: : Oriented to Place, Oriented to  Time, Oriented to Situation, Oriented to Self Alcohol / Substance Use: Not Applicable Psych Involvement: No (comment)  Admission  diagnosis:  Diabetic foot infection (HCC) [O96.295, L08.9] Osteomyelitis of left foot, unspecified type [facility]) [M86.9] Patient Active Problem List   Diagnosis Date Noted  . Diabetic foot infection (HCC) 01/01/2020  . Maggot infestation 08/23/2019  . Pyogenic inflammation of bone (HCC)   . Osteomyelitis of foot, left, acute (HCC) 12/26/2018  . Right knee pain 10/04/2017  . AKI (acute kidney injury) (HCC) 07/18/2013  . Left hip pain 04/10/2013  . Diabetic peripheral neuropathy associated with type 2 diabetes mellitus (HCC) 11/10/2012  . Marfan's syndrome 08/08/2012  . Healthcare maintenance 08/08/2012  . Atopic dermatitis 02/16/2012  . Overweight (BMI 25.0-29.9) 07/05/2011  . Allergic rhinitis due to allergen 07/03/2011  . Venous insufficiency of leg 04/03/2011  . Blindness 07/23/2010  . DM (diabetes mellitus), type 2 with neurological complications (HCC) 06/13/2010  . Hypertension 06/13/2010  . Hyperlipidemia 06/13/2010   PCP:  [name], MD Pharmacy:   [address] [place], [place] - [address] [place] [address] Phone: [phone] Fax: [phone]  [facility] - [place], [place] - [address] [place], [address] [place], [address] [place] [address] Phone: [phone] Fax: [phone]  [id number] [place], [place] - [address] [place] [address] Phone: [phone] Fax: [phone]     Social Determinants of Health (SDOH) Interventions    Readmission Risk Interventions Readmission Risk Prevention Plan 01/04/2020 12/29/2018  Post Dischage Appt Complete Not Complete  Appt Comments - dispo pending  Medication Screening Complete Complete  Transportation Screening Complete Complete  Some recent data might be hidden

## 2020-01-04 NOTE — Progress Notes (Signed)
Inpatient Rehab Admissions Coordinator Note:   Per therapy recommendations, pt was screened for CIR candidacy by Estill Dooms, PT, DPT.  At this time payor unlikely to approve CIR for pt's diagnosis. Also note that pt already min +2 on evaluation and would likely have min goals 2/2 visual impairments in an unfamiliar environment.  Will not request a consult at this time.  Please contact me with questions.   Estill Dooms, PT, DPT [phone] 01/04/20 1:12 PM

## 2020-01-04 NOTE — Evaluation (Signed)
Occupational Therapy Evaluation Patient Details Name: Chad Hall MRN: [id number] DOB: 03-Sep-1950 Today's Date: 01/04/2020    History of Present Illness Pt is a 69 y.o. male admitted 01/01/20 with chronic L foot ulcer with drainage and pain; workup for L foot osteomyelitis. S/p L foot transtibial amputation on 11/17. PMH includes blindness, HTN, DM2, Marfan syndrome.   Clinical Impression   PTA, pt lives with wife and reports Modified Independence with ADLs, iADLs and mobility. Pt uses seeing eye cane and service dog in the community. Pt presents now s/p BKA with deficits in strength, balance and endurance placing pt at high risk for falls. Pt overall Min A x 2 for sit to stand with RW and pivot to recliner chair with cues for safe sequencing. Pt requires physical support to maintain standing balance. Pt requires Supervision for UB ADLs and Mod A for LB ADLs. Pt motivated to return to independence in ADLs, IADLs and mobility. Recommend CIR for comprehensive therapies to assist in returning pt to independence.     Follow Up Recommendations  CIR;Supervision/Assistance - 24 hour    Equipment Recommendations  3 in 1 bedside commode;Wheelchair (measurements OT);Wheelchair cushion (measurements OT);Other (comment) (RW)    Recommendations for Other Services Rehab consult     Precautions / Restrictions Precautions Precautions: Fall;Other (comment) Precaution Comments: L BKA wound vac; blind Restrictions Weight Bearing Restrictions: Yes LLE Weight Bearing: Non weight bearing      Mobility Bed Mobility Overal bed mobility: Needs Assistance Bed Mobility: Supine to Sit     Supine to sit: Supervision;HOB elevated     General bed mobility comments: Assist for line management    Transfers Overall transfer level: Needs assistance Equipment used: Rolling walker (2 wheeled) Transfers: Sit to/from Stand Sit to Stand: Min assist;+2 physical assistance;+2 safety/equipment          General transfer comment: Increased time and effort preparing to stand, cues for hand placement; minA+2 for trunk elevation and stability; pt with heavy reliance on UE support    Balance Overall balance assessment: Needs assistance   Sitting balance-Leahy Scale: Fair Sitting balance - Comments: Required assist to don R shoe   Standing balance support: Bilateral upper extremity supported;During functional activity Standing balance-Leahy Scale: Poor Standing balance comment: Reliant on BUE support and external assist; would likely lose balance with minimal challenge to balance, but not formally assessed                           ADL either performed or assessed with clinical judgement   ADL Overall ADL's : Needs assistance/impaired Eating/Feeding: Set up;Sitting   Grooming: Set up;Sitting   Upper Body Bathing: Set up;Sitting   Lower Body Bathing: Moderate assistance;Sit to/from stand;Sitting/lateral leans   Upper Body Dressing : Set up;Sitting   Lower Body Dressing: Moderate assistance;Sit to/from stand;Sitting/lateral leans Lower Body Dressing Details (indicate cue type and reason): Assistance to don R slipper shoe, will need increased assist in standing  Toilet Transfer: Minimal assistance;+2 for safety/equipment;+2 for physical assistance;Stand-pivot;RW Toilet Transfer Details (indicate cue type and reason): simulated to recliner Toileting- Clothing Manipulation and Hygiene: Moderate assistance;Sitting/lateral lean;Sit to/from stand       Functional mobility during ADLs: Minimal assistance;+2 for physical assistance;+2 for safety/equipment;Cueing for safety;Cueing for sequencing;Rolling walker General ADL Comments: Pt with new BKA, limited by decreased balance, endurance and decreased knowledge of DME use      Vision Baseline Vision/History: Legally blind Patient Visual Report: No change from  baseline Vision Assessment?: Vision impaired- to be further tested in  functional context Additional Comments: pt with hx of blindness, has service dog and uses seeing eye cane in community     Perception     Praxis      Pertinent Vitals/Pain Pain Assessment: Faces Faces Pain Scale: Hurts a little bit Pain Location: L residual limb, as well as phantom limb pain to L foot Pain Descriptors / Indicators: Discomfort;Aching Pain Intervention(s): Limited activity within patient's tolerance;Monitored during session     Hand Dominance Right   Extremity/Trunk Assessment Upper Extremity Assessment Upper Extremity Assessment: Overall WFL for tasks assessed   Lower Extremity Assessment Lower Extremity Assessment: Defer to PT evaluation LLE Deficits / Details: s/p L BKA; hip and knee flex/ext at least 3/5 LLE Coordination: decreased gross motor   Cervical / Trunk Assessment Cervical / Trunk Assessment: Normal   Communication Communication Communication: No difficulties   Cognition Arousal/Alertness: Awake/alert Behavior During Therapy: WFL for tasks assessed/performed Overall Cognitive Status: Within Functional Limits for tasks assessed                                     General Comments  [employer] rep entering at end of session to fit limb guard. Pt agreeable to rehab, understands need and motivated to return to independence    Exercises Amputee Exercises Hip Flexion/Marching: AROM;Left Knee Flexion: AROM;Left Knee Extension: AROM;Left   Shoulder Instructions      Home Living Family/patient expects to be discharged to:: Private residence Living Arrangements: Spouse/significant other Available Help at Discharge: Family;Available PRN/intermittently Type of Home: House Home Access: Stairs to enter Entrance Stairs-Number of Steps: 2 Entrance Stairs-Rails: None (wall on R side) Home Layout: One level     Bathroom Shower/Tub: Producer, television/film/video: Standard     Home Equipment: Shower seat;Hand held shower head;Other  (comment) (seeing eye cane)   Additional Comments: Wife works. Patient has service dog      Prior Functioning/Environment Level of Independence: Independent with assistive device(s)        Comments: Household ambulation without DME. Ambulates outside with service dog and seeing eye cane. Mod indep with ADLs/iADLs, stands for showers        OT Problem List: Decreased strength;Decreased activity tolerance;Impaired balance (sitting and/or standing);Decreased knowledge of use of DME or AE;Decreased knowledge of precautions      OT Treatment/Interventions: Self-care/ADL training;Therapeutic exercise;DME and/or AE instruction;Therapeutic activities;Patient/family education;Balance training    OT Goals(Current goals can be found in the care plan section) Acute Rehab OT Goals Patient Stated Goal: Post-acute rehab to regain independence before return home OT Goal Formulation: With patient Time For Goal Achievement: 01/18/20 Potential to Achieve Goals: Good ADL Goals Pt Will Perform Grooming: standing;with modified independence Pt Will Perform Lower Body Bathing: with modified independence;sitting/lateral leans Pt Will Perform Lower Body Dressing: with modified independence;sitting/lateral leans Pt Will Transfer to Toilet: with supervision;ambulating;bedside commode Pt Will Perform Toileting - Clothing Manipulation and hygiene: with modified independence;sitting/lateral leans  OT Frequency: Min 2X/week   Barriers to D/C:            Co-evaluation PT/OT/SLP Co-Evaluation/Treatment: Yes Reason for Co-Treatment: For patient/therapist safety;To address functional/ADL transfers   OT goals addressed during session: ADL's and self-care      AM-PAC OT "6 Clicks" Daily Activity     Outcome Measure Help from another person eating meals?: A Little Help from another person  taking care of personal grooming?: A Little Help from another person toileting, which includes using toliet, bedpan, or  urinal?: A Lot Help from another person bathing (including washing, rinsing, drying)?: A Lot Help from another person to put on and taking off regular upper body clothing?: A Little Help from another person to put on and taking off regular lower body clothing?: A Lot 6 Click Score: 15   End of Session Equipment Utilized During Treatment: Gait belt;Rolling walker Nurse Communication: Mobility status;Other (comment) (chair alarm)  Activity Tolerance: Patient tolerated treatment well Patient left: in chair;with call bell/phone within reach;Other (comment) (chair pad under pt, no chair alarm in room - RN notified)  OT Visit Diagnosis: Unsteadiness on feet (R26.81);Other abnormalities of gait and mobility (R26.89);Muscle weakness (generalized) (M62.81)                Time: 5189-8421 OT Time Calculation (min): 25 min Charges:  OT General Charges $OT Visit: 1 Visit OT Evaluation $OT Eval Moderate Complexity: 1 Mod  Lorre Munroe, OTR/L  Lorre Munroe 01/04/2020, 12:38 PM

## 2020-01-04 NOTE — Plan of Care (Signed)
°  Problem: Education: °Goal: Knowledge of General Education information will improve °Description: Including pain rating scale, medication(s)/side effects and non-pharmacologic comfort measures °Outcome: Progressing °  °Problem: Clinical Measurements: °Goal: Will remain free from infection °Outcome: Progressing °  °Problem: Activity: °Goal: Risk for activity intolerance will decrease °Outcome: Progressing °  °Problem: Elimination: °Goal: Will not experience complications related to bowel motility °Outcome: Progressing °  °

## 2020-01-04 NOTE — Evaluation (Addendum)
Physical Therapy Evaluation Patient Details Name: Chad Hall MRN: [id number] DOB: 26-Apr-1950 Today's Date: 01/04/2020   History of Present Illness  Pt is a 69 y.o. male admitted 01/01/20 with chronic L foot ulcer with drainage and pain; workup for L foot osteomyelitis. S/p L foot transtibial amputation on 11/17. PMH includes blindness, HTN, DM2, Marfan syndrome.    Clinical Impression  Pt presents with an overall decrease in functional mobility secondary to above. PTA, pt independent with mobility, uses folding blind cane and service dog for community mobility, retired from work and lives with wife. Educ on precautions, positioning, therex, and importance of mobility. Today, pt able to initiate transfer training with RW. Pt limited by generalized weakness, pain and impaired balance strategies/postural reactions; at high risk for falls. Pt would benefit from intservice CIR-level therapies to maximize functional mobility and independence prior to return home. Pt motivated to participate and regain active, independent PLOF.    Follow Up Recommendations CIR;Supervision for mobility/OOB    Equipment Recommendations  Rolling walker with 5" wheels;3in1 (PT);Wheelchair;Wheelchair cushion   Recommendations for Other Services Rehab consult     Precautions / Restrictions Precautions Precautions: Fall;Other (comment) Precaution Comments: L BKA wound vac; blind Restrictions Weight Bearing Restrictions: Yes LLE Weight Bearing: Non weight bearing      Mobility  Bed Mobility Overal bed mobility: Needs Assistance Bed Mobility: Supine to Sit     Supine to sit: Supervision;HOB elevated     General bed mobility comments: Assist for line management    Transfers Overall transfer level: Needs assistance Equipment used: Rolling walker (2 wheeled) Transfers: Sit to/from Stand Sit to Stand: Min assist;+2 physical assistance;+2 safety/equipment         General transfer comment:  Increased time and effort preparing to stand, cues for hand placement; minA+2 for trunk elevation and stability; pt with heavy reliance on UE support  Ambulation/Gait         Gait velocity: Decreased   General Gait Details: Unable to hop completely on R foot, performing minimal hops/scooting on R foot with RW and minA+2 for stability; cues for sequencing  Stairs            Wheelchair Mobility    Modified Rankin (Stroke Patients Only)       Balance Overall balance assessment: Needs assistance   Sitting balance-Leahy Scale: Fair Sitting balance - Comments: Required assist to don R shoe     Standing balance-Leahy Scale: Poor Standing balance comment: Reliant on BUE support and external assist; would likely lose balance with minimal challenge to balance, but not formally assessed                             Pertinent Vitals/Pain Pain Assessment: Faces Faces Pain Scale: Hurts a little bit Pain Location: L residual limb, as well as phantom limb pain to L foot Pain Descriptors / Indicators: Discomfort;Aching Pain Intervention(s): Limited activity within patient's tolerance;Monitored during session    Home Living Family/patient expects to be discharged to:: Private residence Living Arrangements: Spouse/significant other Available Help at Discharge: Family;Available PRN/intermittently Type of Home: House Home Access: Stairs to enter Entrance Stairs-Rails: None (wall on R-side pt uses for support) Entrance Stairs-Number of Steps: 2 Home Layout: One level Home Equipment: Shower seat;Hand held shower head;Other (comment) (seeing eye cane) Additional Comments: Wife works. Patient has service dog    Prior Function Level of Independence: Independent with assistive device(s)  Comments: Household ambulation without DME. Ambulates outside with service dog and seeing eye cane. Mod indep with ADLs/iADLs, stands for showers     Hand Dominance   Dominant  Hand: Right    Extremity/Trunk Assessment   Upper Extremity Assessment Upper Extremity Assessment: Overall WFL for tasks assessed    Lower Extremity Assessment Lower Extremity Assessment: LLE deficits/detail LLE Deficits / Details: s/p L BKA; hip and knee flex/ext at least 3/5 LLE Coordination: decreased gross motor       Communication   Communication: No difficulties  Cognition Arousal/Alertness: Awake/alert Behavior During Therapy: WFL for tasks assessed/performed Overall Cognitive Status: Within Functional Limits for tasks assessed                                        General Comments General comments (skin integrity, edema, etc.): [employer] rep present to fit limb guard    Exercises Amputee Exercises Hip Flexion/Marching: AROM;Left Knee Flexion: AROM;Left Knee Extension: AROM;Left   Assessment/Plan    PT Assessment Patient needs continued PT services  PT Problem List Decreased strength;Decreased activity tolerance;Decreased balance;Decreased mobility;Decreased knowledge of use of DME;Decreased knowledge of precautions;Pain       PT Treatment Interventions DME instruction;Gait training;Stair training;Functional mobility training;Therapeutic activities;Therapeutic exercise;Balance training;Patient/family education;Wheelchair mobility training    PT Goals (Current goals can be found in the Care Plan section)  Acute Rehab PT Goals Patient Stated Goal: Post-acute rehab to regain independence before return home PT Goal Formulation: With patient Time For Goal Achievement: 01/18/20 Potential to Achieve Goals: Good    Frequency Min 3X/week   Barriers to discharge        Co-evaluation PT/OT/SLP Co-Evaluation/Treatment: Yes             AM-PAC PT "6 Clicks" Mobility  Outcome Measure Help needed turning from your back to your side while in a flat bed without using bedrails?: A Little Help needed moving from lying on your back to sitting on the  side of a flat bed without using bedrails?: A Little Help needed moving to and from a bed to a chair (including a wheelchair)?: A Little Help needed standing up from a chair using your arms (e.g., wheelchair or bedside chair)?: A Little Help needed to walk in hospital room?: A Lot Help needed climbing 3-5 steps with a railing? : A Lot 6 Click Score: 16    End of Session Equipment Utilized During Treatment: Gait belt Activity Tolerance: Patient tolerated treatment well Patient left: in chair;with call bell/phone within reach;with nursing/sitter in room Nurse Communication: Mobility status;Other (comment) (pt sitting on chair alarm, but box not in room; RN/NT notified, NT to find one) PT Visit Diagnosis: Other abnormalities of gait and mobility (R26.89);Muscle weakness (generalized) (M62.81)    Time: 3545-6256 PT Time Calculation (min) (ACUTE ONLY): 24 min   Charges:   PT Evaluation $PT Eval Moderate Complexity: 1 Mod     Ina Homes, PT, DPT Acute Rehabilitation Services  Pager [phone] Office [phone]  Malachy Chamber 01/04/2020, 11:52 AM

## 2020-01-05 DIAGNOSIS — M869 Osteomyelitis, unspecified: Secondary | ICD-10-CM | POA: Diagnosis not present

## 2020-01-05 LAB — CBC
HCT: 28.1 % — ABNORMAL LOW (ref 39.0–52.0)
Hemoglobin: 8.2 g/dL — ABNORMAL LOW (ref 13.0–17.0)
MCH: 28.4 pg (ref 26.0–34.0)
MCHC: 29.2 g/dL — ABNORMAL LOW (ref 30.0–36.0)
MCV: 97.2 fL (ref 80.0–100.0)
Platelets: 338 10*3/uL (ref 150–400)
RBC: 2.89 MIL/uL — ABNORMAL LOW (ref 4.22–5.81)
RDW: 13.2 % (ref 11.5–15.5)
WBC: 6.8 10*3/uL (ref 4.0–10.5)
nRBC: 0.3 % — ABNORMAL HIGH (ref 0.0–0.2)

## 2020-01-05 LAB — GLUCOSE, CAPILLARY
Glucose-Capillary: 97 mg/dL (ref 70–99)
Glucose-Capillary: 124 mg/dL — ABNORMAL HIGH (ref 70–99)
Glucose-Capillary: 108 mg/dL — ABNORMAL HIGH (ref 70–99)

## 2020-01-05 LAB — BASIC METABOLIC PANEL
Anion gap: 3 — ABNORMAL LOW (ref 5–15)
BUN: 28 mg/dL — ABNORMAL HIGH (ref 8–23)
CO2: 27 mmol/L (ref 22–32)
Calcium: 8.1 mg/dL — ABNORMAL LOW (ref 8.9–10.3)
Chloride: 115 mmol/L — ABNORMAL HIGH (ref 98–111)
Creatinine, Ser: 1.22 mg/dL (ref 0.61–1.24)
GFR, Estimated: >60 mL/min (ref >60)
Glucose, Bld: 105 mg/dL — ABNORMAL HIGH (ref 70–99)
Potassium: 4.2 mmol/L (ref 3.5–5.1)
Sodium: 145 mmol/L (ref 135–145)

## 2020-01-05 LAB — SURGICAL PATHOLOGY

## 2020-01-05 NOTE — Progress Notes (Signed)
Patient is postop day 2 status post below-knee amputation.  Overall he is doing well from an orthopedic standpoint.  Wound VAC is functioning with 2 green checks.  0 cc in canister   From a orthopedic standpoint patient could discharge to nursing facility when bed available.  Will need 1 week follow-up once discharged to remove wound VAC

## 2020-01-05 NOTE — Care Management (Signed)
RE: Chad, Hall Date of Birth:  April 12, 1950 Date:  01/05/2020  To whom it may concern,  Please be advised that the above-named patient will require a short-term skilled nursing home stay - anticipated 30 days or less for rehabilitation and strengthening.  The plan is for return home.

## 2020-01-05 NOTE — NC FL2 (Signed)
[place] MEDICAID FL2 LEVEL OF CARE SCREENING TOOL     IDENTIFICATION  Patient Name: Chad Hall Birthdate: Nov 14, 1950 Sex: male Admission Date (Current Location): 01/01/2020  [facility] and [place] Number:  Producer, television/film/video and Address:  [facility], [address] [place], [place] [address]      Provider Number: [id number]  Attending Physician Name and Address:  Rolly Salter, MD  Relative Name and Phone Number:  Darrien Belter, spouse - [phone]    Current Level of Care: Hospital Recommended Level of Care: Skilled Nursing Facility Prior Approval Number:    Date Approved/Denied:   PASRR Number: pending  Discharge Plan: SNF    Current Diagnoses: Patient Active Problem List   Diagnosis Date Noted  . Diabetic foot infection (HCC) 01/01/2020  . Maggot infestation 08/23/2019  . Pyogenic inflammation of bone (HCC)   . Osteomyelitis of foot, left, acute (HCC) 12/26/2018  . Right knee pain 10/04/2017  . AKI (acute kidney injury) (HCC) 07/18/2013  . Left hip pain 04/10/2013  . Diabetic peripheral neuropathy associated with type 2 diabetes mellitus (HCC) 11/10/2012  . Marfan's syndrome 08/08/2012  . Healthcare maintenance 08/08/2012  . Atopic dermatitis 02/16/2012  . Overweight (BMI 25.0-29.9) 07/05/2011  . Allergic rhinitis due to allergen 07/03/2011  . Venous insufficiency of leg 04/03/2011  . Blindness 07/23/2010  . DM (diabetes mellitus), type 2 with neurological complications (HCC) 06/13/2010  . Hypertension 06/13/2010  . Hyperlipidemia 06/13/2010    Orientation RESPIRATION BLADDER Height & Weight     Self, Time, Situation, Place  Normal Continent Weight: 90.7 kg Height:  6\' 5"  (195.6 cm)  BEHAVIORAL SYMPTOMS/MOOD NEUROLOGICAL BOWEL NUTRITION STATUS      Continent Diet (See Discharge Summary)  AMBULATORY STATUS COMMUNICATION OF NEEDS Skin   Extensive Assist Verbally Surgical wounds, Wound Vac (prevena wound vac to be  removed in 1 week)                       Personal Care Assistance Level of Assistance  Bathing, Feeding, Dressing Bathing Assistance: Limited assistance Feeding assistance: Limited assistance Dressing Assistance: Limited assistance     Functional Limitations Info  Sight, Hearing, Speech Sight Info: Impaired (Patient is blind since 1967 due to basketball injury) Hearing Info: Adequate Speech Info: Adequate    SPECIAL CARE FACTORS FREQUENCY  PT (By licensed PT), OT (By licensed OT)     PT Frequency: 5 x per week OT Frequency: 5 x per week            Contractures Contractures Info: Not present    Additional Factors Info  Code Status, Allergies, Psychotropic, Insulin Sliding Scale Code Status Info: Full code Allergies Info: NKDA Psychotropic Info: neurontin Insulin Sliding Scale Info: See discharge summary       Current Medications (01/05/2020):  This is the current hospital active medication list Current Facility-Administered Medications  Medication Dose Route Frequency Provider Last Rate Last Admin  . acetaminophen (TYLENOL) tablet 650 mg  650 mg Oral Q6H PRN Persons, 01/07/2020, [place]   650 mg at 01/03/20 2325   Or  . acetaminophen (TYLENOL) suppository 650 mg  650 mg Rectal Q6H PRN Persons, 01/05/20, [place]      . atorvastatin (LIPITOR) tablet 80 mg  80 mg Oral Daily Persons, [place], [place]   80 mg at 01/05/20 0904  . cholecalciferol (VITAMIN D3) tablet 1,000 Units  1,000 Units Oral QHS Persons, 01/07/20, [place]  1,000 Units at 01/04/20 2201  . ciprofloxacin (CIPRO) tablet 500 mg  500 mg Oral BID Rolly Salter, MD   500 mg at 01/05/20 0900  . docusate sodium (COLACE) capsule 100 mg  100 mg Oral BID Persons, [place], [place]   100 mg at 01/05/20 0904  . gabapentin (NEURONTIN) capsule 100 mg  100 mg Oral Daily Persons, [place], [place]   100 mg at 01/05/20 0904  . hydrALAZINE (APRESOLINE) tablet 25 mg  25 mg Oral Q6H Persons, [place], [place]   25 mg at 01/05/20 6759  .  HYDROmorphone (DILAUDID) injection 0.5 mg  0.5 mg Intravenous Q4H PRN Persons, [place], [place]      . insulin aspart (novoLOG) injection 0-9 Units  0-9 Units Subcutaneous TID WC Persons, [place], [place]   2 Units at 01/04/20 1643  . ondansetron (ZOFRAN) tablet 4 mg  4 mg Oral Q6H PRN Persons, [place], [place]       Or  . ondansetron [facility]) injection 4 mg  4 mg Intravenous Q6H PRN Persons, [place], [place]      . oxyCODONE (Oxy IR/ROXICODONE) immediate release tablet 5 mg  5 mg Oral Q6H PRN Persons, [place], [place]   5 mg at 01/04/20 2201     Discharge Medications: Please see discharge summary for a list of discharge medications.  Relevant Imaging Results:  Relevant Lab Results:   Additional Information SS# [id number]  Janae Bridgeman, RN

## 2020-01-05 NOTE — Plan of Care (Signed)
  Problem: Education: Goal: Knowledge of General Education information will improve Description: Including pain rating scale, medication(s)/side effects and non-pharmacologic comfort measures Outcome: Progressing   Problem: Health Behavior/Discharge Planning: Goal: Ability to manage health-related needs will improve Outcome: Progressing   Problem: Activity: Goal: Risk for activity intolerance will decrease Outcome: Progressing   Problem: Elimination: Goal: Will not experience complications related to bowel motility Outcome: Progressing   

## 2020-01-05 NOTE — TOC Progression Note (Addendum)
Transition of Care [facility]) - Progression Note    Patient Details  Name: Chad Hall MRN: [id number] Date of Birth: 1950/09/16  Transition of Care [facility]) CM/SW Contact  Janae Bridgeman, RN Phone Number: 01/05/2020, 11:37 AM  Clinical Narrative:    Case management notes that [facility] will be unable to accept the patient for admission at this time.  I started the PASSR application and sent clinicals, 30 day note and uploaded in PASSR website - pending approval.  Will continue to follow for approved PASSR assigned number.  Case management continued to start SNF work up for SNF placement in the [place], [place] area.  The patient and wife will need to be given Medicare choice regarding SNF placement and offered choice once bed availability is noted in the hub.  The patient was living at home independently with his wife and has been blind since 1967 due to a basketball injury.  The patient normally uses assistance with a service dog and blind cane at home.  The patient will need to be discharged to the SNF with a prevena wound vac intact.  Will continue to follow for PASSR approval, bed offer to patient once bed availability determined and insurance auth to be started at this time.  The patient is fully vaccinated.  01/05/2020 - 1557 - Called and left a message with the patient's wife regarding SNF choice - no answer but left a detailed voice message and return phone call number.  I called [employer] and started insurance authorization with pending SNF choice from the patient/family.  I'm still waiting to receive PASSR number.  I started insurance start date for 01/07/2020 and will update [employer] with the SNF choice once the patient and family have decided.  I also placed a couple more facilities in the hub so that patient might have more than one facility to choose from that is in network with the insurance provider.  Will need to check back with the patient/family for SNF choice and update  [employer] - Clinicals were faxed to [phone] Ref # [id number].   Expected Discharge Plan: IP Rehab Facility Barriers to Discharge: Continued Medical Work up  Expected Discharge Plan and Services Expected Discharge Plan: IP Rehab Facility   Discharge Planning Services: CM Consult Post Acute Care Choice: IP Rehab Living arrangements for the past 2 months: Single Family Home                                       Social Determinants of Health (SDOH) Interventions    Readmission Risk Interventions Readmission Risk Prevention Plan 01/04/2020 12/29/2018  Post Dischage Appt Complete Not Complete  Appt Comments - dispo pending  Medication Screening Complete Complete  Transportation Screening Complete Complete  Some recent data might be hidden

## 2020-01-05 NOTE — Progress Notes (Signed)
[facility] Progress Note  Patient: Chad Hall    OFB:[id number]  DOA: 01/01/2020     Date of Service: the patient was seen and examined on 01/05/2020  Brief hospital course: Past medical history of chronic left leg wound, HTN, HLD, type II DM, CKD 3A, blindness.  Presents with complaints of weakness found to have Enterobacter bacteremia as well as osteomyelitis of the foot.  Started on IV biotics.  On chronic Augmentin.  SP BKA and wound VAC placement. Currently plan is continue antibiotics.  Assessment and Plan: Sepsis from infected left foot diabetes ulcer, POA. acute on chronic osteomyelitis:  Morganella bacteremia. Sepsis physiology resolved. Pt presented with tachypnea and tachycardia.  He had an elevated creatinine of 1.82, and elevated WBC.  Source of infection is the left foot.  1/4 blood cultures is positive for Morganella  Was receiving IV Rocephin and Vancomycin.   Now on oral Cipro Discussed with ID.  Recommend duration for 5 to 7 days postop.  Type 2 diabetes mellitus Controlled with renal complication. Most recent A1c 6.4 but that was in 2020. Repeat HbA1c is 5.3. Sliding scale insulin only.   Acute kidney injury on chronic kidney disease stage IIIa: Baseline creatinine 1.3-1.4. on admission at 1.82.  Improving. This is likely due to poor p.o. intake in the last few days in the setting of generalized weakness.  -Hold home ACE inhibitor  Essential hypertension:  Patient quite hypertensive in the ED,  hold ACE inhibitor due to acute kidney injury, resume home Toprol.  Will increase dose of hydralazine for improved blood pressure control.  Hyperlipidemia:  Continue home statin  Normocytic anemia:  Likely in the context of chronic illness, monitor hemoglobin, no bleeding.  Marfan syndrome,  Blindness Patient will need SNF on discharge given his inability to perform dressing changes as well as living alone at home and having a BKA.  Diet:  Regular diet DVT Prophylaxis:   SCDs Start: 01/03/20 1558 SCDs Start: 01/01/20 2259   Advance goals of care discussion: Full code  Family Communication: no family was present at bedside, at the time of interview.   Disposition:  Status is: Inpatient  Remains inpatient appropriate because:IV treatments appropriate due to intensity of illness or inability to take PO  Dispo: The patient is from: Home              Anticipated d/c is to: SNF              Anticipated d/c date is: 2 days              Patient currently is not medically stable to d/c.  Subjective: No acute complaint.  No nausea no vomiting.  Physical Exam:  General: Appear in mild distress, no Rash; Oral Mucosa Clear, moist. no Abnormal Neck Mass Or lumps, Conjunctiva normal  Cardiovascular: S1 and S2 Present, no Murmur, Respiratory: good respiratory effort, Bilateral Air entry present and CTA, no Crackles, no wheezes Abdomen: Bowel Sound present, Soft and no tenderness Extremities: Left leg BKA, no pedal edema Neurology: alert and oriented to time, place, and person affect appropriate. no new focal deficit Gait not checked due to patient safety concerns  Vitals:   01/04/20 2023 01/05/20 0413 01/05/20 0903 01/05/20 1521  BP: 111/68 123/70 (!) 110/56 127/70  Pulse: 81 81 91 92  Resp: 17 17 20 20   Temp: 98 F (36.7 C) 98 F (36.7 C) 97.7 F (36.5 C) 98.6 F (37 C)  TempSrc:   Oral Oral  SpO2: 100% 99% 99% 99%  Weight:      Height:        Intake/Output Summary (Last 24 hours) at 01/05/2020 1852 Last data filed at 01/05/2020 1300 Gross per 24 hour  Intake 890 ml  Output 825 ml  Net 65 ml   Filed Weights   01/01/20 2301 01/03/20 1310  Weight: 90.7 kg 90.7 kg    Data Reviewed: I have personally reviewed and interpreted daily labs, tele strips, imagings as discussed above. I reviewed all nursing notes, pharmacy notes, vitals, pertinent old records I have discussed plan of care as described above with RN  and patient/family.  CBC: Recent Labs  Lab 01/01/20 1435 01/02/20 0450 01/04/20 0411 01/05/20 0231  WBC 13.4* 10.1 7.4 6.8  NEUTROABS 11.6*  --   --   --   HGB 9.5* 9.4* 8.9* 8.2*  HCT 31.4* 30.6* 30.2* 28.1*  MCV 96.0 94.7 95.6 97.2  PLT 336 289 347 338   Basic Metabolic Panel: Recent Labs  Lab 01/01/20 1435 01/02/20 0450 01/03/20 0211 01/04/20 0411 01/05/20 0231  NA 139 142 143 144 145  K 4.1 4.3 4.4 4.7 4.2  CL 104 110 112* 113* 115*  CO2 23 21* 23 20* 27  GLUCOSE 143* 89 80 128* 105*  BUN 49* 40* 36* 34* 28*  CREATININE 1.82* 1.46* 1.32* 1.27* 1.22  CALCIUM 8.2* 8.3* 8.2* 8.3* 8.1*    Studies: No results found.  Scheduled Meds: . atorvastatin  80 mg Oral Daily  . cholecalciferol  1,000 Units Oral QHS  . ciprofloxacin  500 mg Oral BID  . docusate sodium  100 mg Oral BID  . gabapentin  100 mg Oral Daily  . hydrALAZINE  25 mg Oral Q6H  . insulin aspart  0-9 Units Subcutaneous TID WC   Continuous Infusions: PRN Meds: acetaminophen **OR** acetaminophen, HYDROmorphone (DILAUDID) injection, ondansetron **OR** ondansetron (ZOFRAN) IV, oxyCODONE  Time spent: 35 minutes  Author: Lynden Oxford, MD [facility] 01/05/2020 6:52 PM  To reach On-call, see care teams to locate the attending and reach out via [email or web address]. Between 7PM-7AM, please contact night-coverage If you still have difficulty reaching the attending provider, please page the [facility] (Director on Call) for [facility] on amion for assistance.

## 2020-01-06 DIAGNOSIS — M869 Osteomyelitis, unspecified: Secondary | ICD-10-CM | POA: Diagnosis not present

## 2020-01-06 LAB — CBC
HCT: 28.2 % — ABNORMAL LOW (ref 39.0–52.0)
Hemoglobin: 8.3 g/dL — ABNORMAL LOW (ref 13.0–17.0)
MCH: 28.2 pg (ref 26.0–34.0)
MCHC: 29.4 g/dL — ABNORMAL LOW (ref 30.0–36.0)
MCV: 95.9 fL (ref 80.0–100.0)
Platelets: 330 10*3/uL (ref 150–400)
RBC: 2.94 MIL/uL — ABNORMAL LOW (ref 4.22–5.81)
RDW: 13.3 % (ref 11.5–15.5)
WBC: 8 10*3/uL (ref 4.0–10.5)
nRBC: 0.5 % — ABNORMAL HIGH (ref 0.0–0.2)

## 2020-01-06 LAB — BASIC METABOLIC PANEL
Anion gap: 8 (ref 5–15)
BUN: 23 mg/dL (ref 8–23)
CO2: 23 mmol/L (ref 22–32)
Calcium: 8.2 mg/dL — ABNORMAL LOW (ref 8.9–10.3)
Chloride: 112 mmol/L — ABNORMAL HIGH (ref 98–111)
Creatinine, Ser: 1.09 mg/dL (ref 0.61–1.24)
GFR, Estimated: >60 mL/min (ref >60)
Glucose, Bld: 103 mg/dL — ABNORMAL HIGH (ref 70–99)
Potassium: 4.2 mmol/L (ref 3.5–5.1)
Sodium: 143 mmol/L (ref 135–145)

## 2020-01-06 LAB — GLUCOSE, CAPILLARY
Glucose-Capillary: 152 mg/dL — ABNORMAL HIGH (ref 70–99)
Glucose-Capillary: 92 mg/dL (ref 70–99)
Glucose-Capillary: 102 mg/dL — ABNORMAL HIGH (ref 70–99)
Glucose-Capillary: 118 mg/dL — ABNORMAL HIGH (ref 70–99)
Glucose-Capillary: 120 mg/dL — ABNORMAL HIGH (ref 70–99)

## 2020-01-06 NOTE — Progress Notes (Signed)
Patient ID: Chad Hall, male   DOB: December 04, 1950, 69 y.o.   MRN: [id number] Patient comfortable this morning wound VAC functioning well.  Plan for discharge to skilled nursing on Monday.

## 2020-01-06 NOTE — Progress Notes (Signed)
[facility] Progress Note  Patient: Chad Hall    JJO:[id number]  DOA: 01/01/2020     Date of Service: the patient was seen and examined on 01/06/2020  Brief hospital course: Past medical history of chronic left leg wound, HTN, HLD, type II DM, CKD 3A, blindness.  Presents with complaints of weakness found to have Enterobacter bacteremia as well as osteomyelitis of the foot.  Started on IV biotics.  On chronic Augmentin.  SP BKA and wound VAC placement. Currently plan is continue antibiotics arrange for SNF placement.  Assessment and Plan: Sepsis from infected left foot diabetes ulcer, POA. acute on chronic osteomyelitis:  Morganella bacteremia. Sepsis physiology resolved. Pt presented with tachypnea and tachycardia.  He had an elevated creatinine of 1.82, and elevated WBC.  Source of infection is the left foot.  1/4 blood cultures is positive for Morganella  Was receiving IV Rocephin and Vancomycin.   Now on oral Cipro Discussed with ID.  Recommend duration for 5 to 7 days postop.  Type 2 diabetes mellitus Controlled with renal complication. Most recent A1c 6.4 but that was in 2020. Repeat HbA1c is 5.3. Sliding scale insulin only.   Acute kidney injury on chronic kidney disease stage IIIa: Baseline creatinine 1.3-1.4. on admission at 1.82.  Improving. This is likely due to poor p.o. intake in the last few days in the setting of generalized weakness.  -Hold home ACE inhibitor  Essential hypertension:  Patient quite hypertensive in the ED,  hold ACE inhibitor due to acute kidney injury, resume home Toprol.  Will increase dose of hydralazine for improved blood pressure control.  Hyperlipidemia:  Continue home statin  Normocytic anemia:  Likely in the context of chronic illness, monitor hemoglobin, no bleeding.  Marfan syndrome,  Blindness Patient will need SNF on discharge given his inability to perform dressing changes as well as living alone at home  and having a BKA.  Diet: Regular diet DVT Prophylaxis:   SCDs Start: 01/03/20 1558 SCDs Start: 01/01/20 2259   Advance goals of care discussion: Full code  Family Communication: no family was present at bedside, at the time of interview.   Disposition:  Status is: Inpatient  Remains inpatient appropriate because:IV treatments appropriate due to intensity of illness or inability to take PO  Dispo: The patient is from: Home              Anticipated d/c is to: SNF              Anticipated d/c date is: 2 days              Patient currently is not medically stable to d/c.  Subjective: Pain well controlled.  No nausea no vomiting.  Physical Exam:  General: Appear in mild distress, no Rash; Oral Mucosa Clear, moist. no Abnormal Neck Mass Or lumps, Conjunctiva normal  Cardiovascular: S1 and S2 Present, no Murmur, Respiratory: good respiratory effort, Bilateral Air entry present and CTA, no Crackles, no wheezes Abdomen: Bowel Sound present, Soft and no tenderness Extremities: no Pedal edema, left BKA Neurology: alert and oriented to time, place, and person affect appropriate. no new focal deficit Gait not checked due to patient safety concerns   Vitals:   01/05/20 2053 01/06/20 0419 01/06/20 0909 01/06/20 1740  BP: 116/68 139/62 (!) 127/54 121/60  Pulse: 70 (!) 57 78 62  Resp: 16 16 18 18   Temp: 97.8 F (36.6 C) 98.7 F (37.1 C) 98 F (36.7 C) (!) 97.4 F (36.3 C)  TempSrc: Oral Oral Oral Oral  SpO2: 97% 100% 99% 100%  Weight:      Height:        Intake/Output Summary (Last 24 hours) at 01/06/2020 1802 Last data filed at 01/06/2020 0900 Gross per 24 hour  Intake 820 ml  Output 1000 ml  Net -180 ml   Filed Weights   01/01/20 2301 01/03/20 1310  Weight: 90.7 kg 90.7 kg    Data Reviewed: I have personally reviewed and interpreted daily labs, tele strips, imagings as discussed above. I reviewed all nursing notes, pharmacy notes, vitals, pertinent old records I have  discussed plan of care as described above with RN and patient/family.  CBC: Recent Labs  Lab 01/01/20 1435 01/02/20 0450 01/04/20 0411 01/05/20 0231 01/06/20 0152  WBC 13.4* 10.1 7.4 6.8 8.0  NEUTROABS 11.6*  --   --   --   --   HGB 9.5* 9.4* 8.9* 8.2* 8.3*  HCT 31.4* 30.6* 30.2* 28.1* 28.2*  MCV 96.0 94.7 95.6 97.2 95.9  PLT 336 289 347 338 330   Basic Metabolic Panel: Recent Labs  Lab 01/02/20 0450 01/03/20 0211 01/04/20 0411 01/05/20 0231 01/06/20 0152  NA 142 143 144 145 143  K 4.3 4.4 4.7 4.2 4.2  CL 110 112* 113* 115* 112*  CO2 21* 23 20* 27 23  GLUCOSE 89 80 128* 105* 103*  BUN 40* 36* 34* 28* 23  CREATININE 1.46* 1.32* 1.27* 1.22 1.09  CALCIUM 8.3* 8.2* 8.3* 8.1* 8.2*    Studies: No results found.  Scheduled Meds:  atorvastatin  80 mg Oral Daily   cholecalciferol  1,000 Units Oral QHS   ciprofloxacin  500 mg Oral BID   docusate sodium  100 mg Oral BID   gabapentin  100 mg Oral Daily   hydrALAZINE  25 mg Oral Q6H   insulin aspart  0-9 Units Subcutaneous TID WC   Continuous Infusions: PRN Meds: acetaminophen **OR** acetaminophen, HYDROmorphone (DILAUDID) injection, ondansetron **OR** ondansetron (ZOFRAN) IV, oxyCODONE  Time spent: 35 minutes  Author: Lynden Oxford, MD [facility] 01/06/2020 6:02 PM  To reach On-call, see care teams to locate the attending and reach out via [email or web address]. Between 7PM-7AM, please contact night-coverage If you still have difficulty reaching the attending provider, please page the [facility] (Director on Call) for [facility] on amion for assistance.

## 2020-01-06 NOTE — Plan of Care (Signed)
  Problem: Coping: Goal: Ability to adjust to condition or change in health will improve Outcome: Progressing   Problem: Nutritional: Goal: Maintenance of adequate nutrition will improve Outcome: Progressing Goal: Progress toward achieving an optimal weight will improve Outcome: Progressing   Problem: Skin Integrity: Goal: Risk for impaired skin integrity will decrease Outcome: Progressing   Problem: Education: Goal: Knowledge of General Education information will improve Description: Including pain rating scale, medication(s)/side effects and non-pharmacologic comfort measures Outcome: Progressing   Problem: Nutrition: Goal: Adequate nutrition will be maintained Outcome: Progressing   Problem: Activity: Goal: Risk for activity intolerance will decrease Outcome: Progressing   Problem: Coping: Goal: Level of anxiety will decrease Outcome: Progressing   Problem: Elimination: Goal: Will not experience complications related to bowel motility Outcome: Progressing Goal: Will not experience complications related to urinary retention Outcome: Progressing   Problem: Pain Managment: Goal: General experience of comfort will improve Outcome: Progressing   Problem: Safety: Goal: Ability to remain free from injury will improve Outcome: Progressing

## 2020-01-07 DIAGNOSIS — M869 Osteomyelitis, unspecified: Secondary | ICD-10-CM | POA: Diagnosis not present

## 2020-01-07 LAB — GLUCOSE, CAPILLARY
Glucose-Capillary: 78 mg/dL (ref 70–99)
Glucose-Capillary: 101 mg/dL — ABNORMAL HIGH (ref 70–99)
Glucose-Capillary: 101 mg/dL — ABNORMAL HIGH (ref 70–99)
Glucose-Capillary: 110 mg/dL — ABNORMAL HIGH (ref 70–99)

## 2020-01-07 LAB — CBC
HCT: 29.1 % — ABNORMAL LOW (ref 39.0–52.0)
Hemoglobin: 8.8 g/dL — ABNORMAL LOW (ref 13.0–17.0)
MCH: 28.9 pg (ref 26.0–34.0)
MCHC: 30.2 g/dL (ref 30.0–36.0)
MCV: 95.4 fL (ref 80.0–100.0)
Platelets: 370 10*3/uL (ref 150–400)
RBC: 3.05 MIL/uL — ABNORMAL LOW (ref 4.22–5.81)
RDW: 13.2 % (ref 11.5–15.5)
WBC: 7.8 10*3/uL (ref 4.0–10.5)
nRBC: 0 % (ref 0.0–0.2)

## 2020-01-07 LAB — BASIC METABOLIC PANEL
Anion gap: 7 (ref 5–15)
BUN: 19 mg/dL (ref 8–23)
CO2: 25 mmol/L (ref 22–32)
Calcium: 8.6 mg/dL — ABNORMAL LOW (ref 8.9–10.3)
Chloride: 112 mmol/L — ABNORMAL HIGH (ref 98–111)
Creatinine, Ser: 1.15 mg/dL (ref 0.61–1.24)
GFR, Estimated: >60 mL/min (ref >60)
Glucose, Bld: 90 mg/dL (ref 70–99)
Potassium: 4.5 mmol/L (ref 3.5–5.1)
Sodium: 144 mmol/L (ref 135–145)

## 2020-01-07 LAB — SARS CORONAVIRUS 2 BY RT PCR (HOSPITAL ORDER, PERFORMED IN ~~LOC~~ HOSPITAL LAB): SARS Coronavirus 2: NEGATIVE

## 2020-01-07 NOTE — Progress Notes (Signed)
[facility] Progress Note  Patient: Chad Hall    SWF:[id number]  DOA: 01/01/2020     Date of Service: the patient was seen and examined on 01/07/2020  Brief hospital course: Past medical history of chronic left leg wound, HTN, HLD, type II DM, CKD 3A, blindness.  Presents with complaints of weakness found to have Enterobacter bacteremia as well as osteomyelitis of the foot.  Started on IV biotics.  On chronic Augmentin.  SP BKA and wound VAC placement. Currently plan is continue antibiotics arrange for SNF placement.  Assessment and Plan: Sepsis from infected left foot diabetes ulcer, POA. acute on chronic osteomyelitis:  Morganella bacteremia. Sepsis physiology resolved. Pt presented with tachypnea and tachycardia.  He had an elevated creatinine of 1.82, and elevated WBC.  Source of infection is the left foot.  1/4 blood cultures is positive for Morganella  Was receiving IV Rocephin and Vancomycin.   Now on oral Cipro Discussed with ID.  Recommend duration for 5 to 7 days postop.  Type 2 diabetes mellitus Controlled with renal complication. Most recent A1c 6.4 but that was in 2020. Repeat HbA1c is 5.3. Sliding scale insulin only.   Acute kidney injury on chronic kidney disease stage IIIa: Baseline creatinine 1.3-1.4. on admission at 1.82.  Improving. This is likely due to poor p.o. intake in the last few days in the setting of generalized weakness.  -Hold home ACE inhibitor  Essential hypertension:  Patient quite hypertensive in the ED,  hold ACE inhibitor due to acute kidney injury, resume home Toprol.  Will increase dose of hydralazine for improved blood pressure control.  Hyperlipidemia:  Continue home statin  Normocytic anemia:  Likely in the context of chronic illness, monitor hemoglobin, no bleeding.  Marfan syndrome,  Blindness Patient will need SNF on discharge given his inability to perform dressing changes as well as living alone at home  and having a BKA.  Diet: Regular diet DVT Prophylaxis:   SCDs Start: 01/03/20 1558 SCDs Start: 01/01/20 2259   Advance goals of care discussion: Full code  Family Communication: no family was present at bedside, at the time of interview.   Disposition:  Status is: Inpatient  Remains inpatient appropriate because: Arranging safe discharge  Dispo: The patient is from: Home              Anticipated d/c is to: SNF              Anticipated d/c date is: Tomorrow              Patient currently medically stable.  Subjective: No acute complaint but no nausea no vomiting.  Physical Exam:  General: Appear in mild distress, no Rash; Oral Mucosa Clear, moist. no Abnormal Neck Mass Or lumps, Conjunctiva normal  Cardiovascular: S1 and S2 Present, no Murmur, Respiratory: good respiratory effort, Bilateral Air entry present and CTA, no Crackles, no wheezes Abdomen: Bowel Sound present, Soft and no tenderness Extremities: no Pedal edema, left BKA Neurology: alert and oriented to time, place, and person affect appropriate. no new focal deficit Gait not checked due to patient safety concerns  Vitals:   01/06/20 1920 01/07/20 0258 01/07/20 0811 01/07/20 1700  BP: 127/65 139/67 134/72 137/68  Pulse: (!) 56 83 84 83  Resp: 16 18 16 16   Temp: 98.3 F (36.8 C) 98.4 F (36.9 C) 98.6 F (37 C) 98.3 F (36.8 C)  TempSrc: Oral Oral Oral Oral  SpO2: 100% 99% 98% 99%  Weight:  Height:        Intake/Output Summary (Last 24 hours) at 01/07/2020 2035 Last data filed at 01/07/2020 1300 Gross per 24 hour  Intake 1120 ml  Output 725 ml  Net 395 ml   Filed Weights   01/01/20 2301 01/03/20 1310  Weight: 90.7 kg 90.7 kg    Data Reviewed: I have personally reviewed and interpreted daily labs, tele strips, imagings as discussed above. I reviewed all nursing notes, pharmacy notes, vitals, pertinent old records I have discussed plan of care as described above with RN and  patient/family.  CBC: Recent Labs  Lab 01/01/20 1435 01/01/20 1435 01/02/20 0450 01/04/20 0411 01/05/20 0231 01/06/20 0152 01/07/20 0655  WBC 13.4*   < > 10.1 7.4 6.8 8.0 7.8  NEUTROABS 11.6*  --   --   --   --   --   --   HGB 9.5*   < > 9.4* 8.9* 8.2* 8.3* 8.8*  HCT 31.4*   < > 30.6* 30.2* 28.1* 28.2* 29.1*  MCV 96.0   < > 94.7 95.6 97.2 95.9 95.4  PLT 336   < > 289 347 338 330 370   < > = values in this interval not displayed.   Basic Metabolic Panel: Recent Labs  Lab 01/03/20 0211 01/04/20 0411 01/05/20 0231 01/06/20 0152 01/07/20 0655  NA 143 144 145 143 144  K 4.4 4.7 4.2 4.2 4.5  CL 112* 113* 115* 112* 112*  CO2 23 20* 27 23 25   GLUCOSE 80 128* 105* 103* 90  BUN 36* 34* 28* 23 19  CREATININE 1.32* 1.27* 1.22 1.09 1.15  CALCIUM 8.2* 8.3* 8.1* 8.2* 8.6*    Studies: No results found.  Scheduled Meds: . atorvastatin  80 mg Oral Daily  . cholecalciferol  1,000 Units Oral QHS  . ciprofloxacin  500 mg Oral BID  . docusate sodium  100 mg Oral BID  . gabapentin  100 mg Oral Daily  . hydrALAZINE  25 mg Oral Q6H  . insulin aspart  0-9 Units Subcutaneous TID WC   Continuous Infusions: PRN Meds: acetaminophen **OR** acetaminophen, HYDROmorphone (DILAUDID) injection, ondansetron **OR** ondansetron (ZOFRAN) IV, oxyCODONE  Time spent: 35 minutes  Author: , MD [facility] 01/07/2020 8:35 PM  To reach On-call, see care teams to locate the attending and reach out via [email or web address]. Between 7PM-7AM, please contact night-coverage If you still have difficulty reaching the attending provider, please page the [facility] (Director on Call) for [facility] on amion for assistance.

## 2020-01-07 NOTE — Progress Notes (Signed)
Pharmacy Antibiotic Note  Chad Hall is a 69 y.o. male admitted on 01/01/2020 with Morganella  bacteremia.  Pharmacy has been consulted for Cipro dosing.   Pt is s/p left BKA on 11/17. Afebrile, WBC normalized, renal function stable. ID recommends stopping abx 5-7 days post-op. Likely will discharge to SNF on 11/22.  Plan: Continue cipro 500 mg po bid for 1-2 more days. Pharmacy will sign off.  Height: 6\' 5"  (195.6 cm) Weight: 90.7 kg (199 lb 15.3 oz) IBW/kg (Calculated) : 89.1  Temp (24hrs), Avg:98 F (36.7 C), Min:97.4 F (36.3 C), Max:98.4 F (36.9 C)  Recent Labs  Lab 01/01/20 1435 01/01/20 1435 01/01/20 1550 01/02/20 0450 01/02/20 0450 01/03/20 0211 01/04/20 0411 01/05/20 0231 01/06/20 0152 01/07/20 0655  WBC 13.4*   < >  --  10.1  --   --  7.4 6.8 8.0 7.8  CREATININE 1.82*   < >  --  1.46*   < > 1.32* 1.27* 1.22 1.09 1.15  LATICACIDVEN 1.3  --  1.0  --   --   --   --   --   --   --    < > = values in this interval not displayed.    Estimated Creatinine Clearance: 74.3 mL/min (by C-G formula based on SCr of 1.15 mg/dL).    No Known Allergies  Antimicrobials this admission: Cipro 11/18 >>   Thank you for allowing pharmacy to be a part of this patients care.  12/18, PharmD PGY2 Pharmacy Resident Phone between 7 am - 3:30 pm: Lulu Riding  Please check AMION for all [facility] phone numbers After 10:00 PM, call Main Pharmacy [phone]  01/07/2020, 10:50 AM

## 2020-01-07 NOTE — TOC Progression Note (Addendum)
Transition of Care [facility]) - Progression Note    Patient Details  Name: Chad Hall MRN: [id number] Date of Birth: [birth year]  Transition of Care [facility]) CM/SW [place], [place] Phone Number: 01/07/2020, 1:37 PM  Clinical Narrative:     CSW met with pt at bedside to give only SNF choice.  CSW read pt information on [facility] from [place].gov website.  Pt is agreeable to received SNF rehab at [facility]. CSW attempted to leave medicare.gov information for SNF for pt's spouse.  Pt informed CSW that pt's wife is blind.  Pt did ask CSW to leave a copy on tray. Pt will need a covid test within 24 hours of discharging.  Juliann Pulse with [employer] has been contacted.Marland Kitchen Physician has been updated on covid test and has placed an order.  CSW will continue to assist with disposition planning.  Expected Discharge Plan: IP Rehab Facility Barriers to Discharge: Continued Medical Work up  Expected Discharge Plan and Services Expected Discharge Plan: [place]   Discharge Planning Services: CM Consult Post Acute Care Choice: IP Rehab Living arrangements for the past 2 months: Single Family Home                                       Social Determinants of Health (SDOH) Interventions    Readmission Risk Interventions Readmission Risk Prevention Plan 01/04/2020 12/29/2018  Post Dischage Appt Complete Not Complete  Appt Comments - dispo pending  Medication Screening Complete Complete  Transportation Screening Complete Complete  Some recent data might be hidden

## 2020-01-07 NOTE — [place] FL2 (Signed)
[place] MEDICAID FL2 LEVEL OF CARE SCREENING TOOL     IDENTIFICATION  Patient Name: Chad Hall Birthdate: 1950-12-11 Sex: male Admission Date (Current Location): 01/01/2020  [facility] and [place] Number:  Producer, television/film/video and Address:  [facility], [address], [place], [place] [address]      Provider Number: [id number]  Attending Physician Name and Address:  Rolly Salter, MD  Relative Name and Phone Number:  Shloma Roggenkamp, spouse - [phone]    Current Level of Care: Hospital Recommended Level of Care: Skilled Nursing Facility Prior Approval Number:    Date Approved/Denied:   PASRR Number: [id number]  Discharge Plan: SNF    Current Diagnoses: Patient Active Problem List   Diagnosis Date Noted  . Diabetic foot infection (HCC) 01/01/2020  . Maggot infestation 08/23/2019  . Pyogenic inflammation of bone (HCC)   . Osteomyelitis of foot, left, acute (HCC) 12/26/2018  . Right knee pain 10/04/2017  . AKI (acute kidney injury) (HCC) 07/18/2013  . Left hip pain 04/10/2013  . Diabetic peripheral neuropathy associated with type 2 diabetes mellitus (HCC) 11/10/2012  . Marfan's syndrome 08/08/2012  . Healthcare maintenance 08/08/2012  . Atopic dermatitis 02/16/2012  . Overweight (BMI 25.0-29.9) 07/05/2011  . Allergic rhinitis due to allergen 07/03/2011  . Venous insufficiency of leg 04/03/2011  . Blindness 07/23/2010  . DM (diabetes mellitus), type 2 with neurological complications (HCC) 06/13/2010  . Hypertension 06/13/2010  . Hyperlipidemia 06/13/2010    Orientation RESPIRATION BLADDER Height & Weight     Self, Time, Situation, Place  Normal Continent Weight: 199 lb 15.3 oz (90.7 kg) Height:  6\' 5"  (195.6 cm)  BEHAVIORAL SYMPTOMS/MOOD NEUROLOGICAL BOWEL NUTRITION STATUS      Continent Diet (See Discharge Summary)  AMBULATORY STATUS COMMUNICATION OF NEEDS Skin   Extensive Assist Verbally Surgical wounds, Wound Vac  (prevena wound vac to be removed in 1 week)                       Personal Care Assistance Level of Assistance  Bathing, Feeding, Dressing Bathing Assistance: Limited assistance Feeding assistance: Limited assistance Dressing Assistance: Limited assistance     Functional Limitations Info  Sight, Hearing, Speech Sight Info: Impaired (Patient is blind since 1967 due to basketball injury) Hearing Info: Adequate Speech Info: Adequate    SPECIAL CARE FACTORS FREQUENCY  PT (By licensed PT), OT (By licensed OT)     PT Frequency: 5 x per week OT Frequency: 5 x per week            Contractures Contractures Info: Not present    Additional Factors Info  Code Status, Allergies, Psychotropic, Insulin Sliding Scale Code Status Info: Full code Allergies Info: NKDA Psychotropic Info: neurontin Insulin Sliding Scale Info: See discharge summary       Current Medications (01/07/2020):  This is the current hospital active medication list Current Facility-Administered Medications  Medication Dose Route Frequency Provider Last Rate Last Admin  . acetaminophen (TYLENOL) tablet 650 mg  650 mg Oral Q6H PRN Persons, 01/09/2020, [place]   650 mg at 01/03/20 2325   Or  . acetaminophen (TYLENOL) suppository 650 mg  650 mg Rectal Q6H PRN Persons, 01/05/20, PA      . atorvastatin (LIPITOR) tablet 80 mg  80 mg Oral Daily Persons, [place], PA   80 mg at 01/07/20 1011  . cholecalciferol (VITAMIN D3) tablet 1,000 Units  1,000 Units Oral QHS Persons, 01/09/20  [place], PA   1,000 Units at 01/06/20 2213  . ciprofloxacin (CIPRO) tablet 500 mg  500 mg Oral BID Rolly Salter, MD   500 mg at 01/07/20 1011  . docusate sodium (COLACE) capsule 100 mg  100 mg Oral BID Persons, [place], PA   100 mg at 01/07/20 1011  . gabapentin (NEURONTIN) capsule 100 mg  100 mg Oral Daily Persons, [place], PA   100 mg at 01/07/20 1011  . hydrALAZINE (APRESOLINE) tablet 25 mg  25 mg Oral Q6H Persons, [place], [place]   25 mg at  01/07/20 1212  . HYDROmorphone (DILAUDID) injection 0.5 mg  0.5 mg Intravenous Q4H PRN Persons, [place], PA      . insulin aspart (novoLOG) injection 0-9 Units  0-9 Units Subcutaneous TID WC Persons, [place], PA   2 Units at 01/05/20 1800  . ondansetron (ZOFRAN) tablet 4 mg  4 mg Oral Q6H PRN Persons, [place], PA       Or  . ondansetron [facility]) injection 4 mg  4 mg Intravenous Q6H PRN Persons, [place], [place]      . oxyCODONE (Oxy IR/ROXICODONE) immediate release tablet 5 mg  5 mg Oral Q6H PRN Persons, [place], PA   5 mg at 01/04/20 2201     Discharge Medications: Please see discharge summary for a list of discharge medications.  Relevant Imaging Results:  Relevant Lab Results:   Additional Information SS# [id number]  Arvin Collard, [place]

## 2020-01-07 NOTE — TOC Progression Note (Signed)
Transition of Care [facility]) - Progression Note    Patient Details  Name: Chad Hall MRN: [id number] Date of Birth: Sep 05, 1950  Transition of Care [facility]) CM/SW Contact  Jimel Myler, [place], [place] Phone Number: 01/07/2020, 1:50 PM  Clinical Narrative:    Phone call to [facility] to obtain update on insurance authorization extension and to provide patient's facility choice. [employer] health authorization 01/07/20-01/09/20. Next review date 01/09/20. Patient's choice for [employer] provided.  Latoy Labriola, LCSW Transitions of Care [phone]    Expected Discharge Plan: IP Rehab Facility Barriers to Discharge: Continued Medical Work up  Expected Discharge Plan and Services Expected Discharge Plan: IP Rehab Facility   Discharge Planning Services: CM Consult Post Acute Care Choice: IP Rehab Living arrangements for the past 2 months: Single Family Home                                       Social Determinants of Health (SDOH) Interventions    Readmission Risk Interventions Readmission Risk Prevention Plan 01/04/2020 12/29/2018  Post Dischage Appt Complete Not Complete  Appt Comments - dispo pending  Medication Screening Complete Complete  Transportation Screening Complete Complete  Some recent data might be hidden

## 2020-01-08 ENCOUNTER — Inpatient Hospital Stay: Payer: Medicare Other

## 2020-01-08 DIAGNOSIS — M869 Osteomyelitis, unspecified: Secondary | ICD-10-CM | POA: Diagnosis not present

## 2020-01-08 DIAGNOSIS — Z23 Encounter for immunization: Secondary | ICD-10-CM

## 2020-01-08 LAB — GLUCOSE, CAPILLARY
Glucose-Capillary: 87 mg/dL (ref 70–99)
Glucose-Capillary: 118 mg/dL — ABNORMAL HIGH (ref 70–99)
Glucose-Capillary: 115 mg/dL — ABNORMAL HIGH (ref 70–99)
Glucose-Capillary: 133 mg/dL — ABNORMAL HIGH (ref 70–99)

## 2020-01-08 LAB — CULTURE, BLOOD (ROUTINE X 2): Special Requests: ADEQUATE

## 2020-01-08 MED ORDER — CIPROFLOXACIN HCL 500 MG PO TABS: 500.0000 mg | ORAL_TABLET | Freq: Two times a day (BID) | ORAL | 0 refills | Status: AC

## 2020-01-08 MED ORDER — OXYCODONE-ACETAMINOPHEN 5-325 MG PO TABS
1.0000 | ORAL_TABLET | Freq: Three times a day (TID) | ORAL | 0 refills | Status: AC | PRN
Start: 2020-01-08 — End: 2020-01-13

## 2020-01-08 MED ORDER — HYDRALAZINE HCL 25 MG PO TABS
25.0000 mg | ORAL_TABLET | Freq: Three times a day (TID) | ORAL | 0 refills | Status: DC
Start: 2020-01-08 — End: 2020-04-09

## 2020-01-08 NOTE — Discharge Summary (Signed)
[facility] Discharge Summary   Patient: Chad Hall ZOX:[id number]  PCP: [name], MD  Date of admission: 01/01/2020   Date of discharge:  01/08/2020     Discharge Diagnoses:  Principal diagnosis Osteomyelitis of left foot Active Problems:   DM (diabetes mellitus), type 2 with neurological complications (HCC)   Hypertension   Hyperlipidemia   Marfan's syndrome   Diabetic peripheral neuropathy associated with type 2 diabetes mellitus (HCC)   AKI (acute kidney injury) (HCC)   Osteomyelitis of foot, left, acute (HCC)   Diabetic foot infection (HCC)   Admitted From: home Disposition:  SNF   Recommendations for Outpatient Follow-up:  1. PCP: follow up with PCP and Orthopedics as recommended 2. Follow up LABS/TEST:  none   Contact information for follow-up providers    Adonis Huguenin, NP In 1 week.   Specialty: Orthopedic Surgery Contact information: [address] [place] [address] [phone]        [name], MD. Schedule an appointment as soon as possible for a visit in 1 week(s).   Specialty: Family Medicine Contact information: [address] [place] [address] [phone]            Contact information for after-discharge care    Destination    [facility] Preferred SNF .   Service: Skilled Nursing Contact information: [address] [place] [address] [phone]                 Diet recommendation: Cardiac diet  Activity: The patient is advised to gradually reintroduce usual activities, as tolerated  Discharge Condition: stable  Code Status: Full code   History of present illness: As per the H and P dictated on admission, "Chad Hall is a 69 y.o. male with medical history significant of blindness, chronic left lower extremity wound to his left foot, followed by wound center (Dr. Leanord Hawking,), hypertension, hyperlipidemia, type 2 diabetes mellitus, chronic kidney disease stage  IIIa, presents to the hospital with chief complaint of weakness.  Patient tells me that he was in his normal state of health up until about 4-5 days ago (last Thursday), and since then he experienced progressive weakness, difficulties ambulating due to generalized fatigue, as well as "feeling hot" at times.  He denies any frank fever or chills.  He is seen in the wound center regularly and most recently saw Dr. Leanord Hawking about couple of weeks ago on 11/5 and he went some wound debridement.  He also has chronic osteomyelitis followed by infectious disease as an outpatient, most recently seen on 11/4 and he was placed on Augmentin for 6-10 weeks from 10/22 and he currently is still on that.  Patient also reports decreased appetite for the past 4 days and poor p.o. intake.  He denies any chest pain, denies any shortness of breath.  He denies any abdominal pain, did have an episode of nausea and vomiting on Thursday but nothing since."  Hospital Course:  Summary of his active problems in the hospital is as following. Sepsis from infected left foot diabetes ulcer, POA. acute on chronic osteomyelitis:  Morganella and providencia  bacteremia. Sepsis physiology resolved. Pt presented with tachypnea and tachycardia.  He had an elevated creatinine of 1.82, and elevated WBC.  Source of infection is the left foot.  1/4 blood cultures is positive for Morganella and providencia Was receiving IV Rocephin and Vancomycin.   Now on oral Cipro Discussed with ID. Last day of Antibiotics 01/10/2020. Pt will resume  home Augmentin that he take for chronic prophylaxis after that.   Type 2 diabetes mellitus Controlled with renal complication. Most recent A1c 6.4 but that was in 2020. Repeat HbA1c is 5.3. resume home regimen  Acute kidney injury on chronic kidney disease stage IIIa Baseline creatinine 1.3-1.4. on admission at 1.82. Improving. This is likely due to poor p.o. intake in the last few days in the setting  of generalized weakness.  Hold home ACE inhibitor  Essential hypertension Patient quite hypertensive in the ED,  hold ACE inhibitor due to acute kidney injury, resume home resimen   Hyperlipidemia Continue home statin  Normocytic anemia Likely in the context of chronic illness, monitor hemoglobin, no bleeding.  Marfan syndrome Blindness Patient will need SNF on discharge given his inability to perform dressing changes as well as living alone at home and having a BKA.  Pain control  - [employer] Controlled Substance Reporting System database was reviewed. - 5 day supply was provided. - Patient was instructed, not to drive, operate heavy machinery, perform activities at heights, swimming or participation in water activities or provide baby sitting services while on Pain, Sleep and Anxiety Medications; until his outpatient Physician has advised to do so again.  - Also recommended to not to take more than prescribed Pain, Sleep and Anxiety Medications.  Patient was seen by physical therapy, who recommended SNF,  was arranged. On the day of the discharge the patient's vitals were stable, and no other acute medical condition were reported by patient. The patient was felt safe to be discharge at SNF with therapy  Consultants: Orthopedics  Procedures: Transtibial amputation Application of Prevena wound VAC  Discharge Exam: General: Appear in no distress, no Rash; Oral Mucosa Clear, moist. no Abnormal Neck Mass Or lumps, chronic blindness Cardiovascular: S1 and S2 Present, no Murmur Respiratory: good respiratory effort, Bilateral Air entry present and CTA, no Crackles, no wheezes Abdomen: Bowel Sound present, Soft and no tenderness Extremities: no Pedal edema, left BKA Neurology: alert and oriented to time, place, and person affect appropriate. no new focal deficit  Filed Weights   01/01/20 2301 01/03/20 1310  Weight: 90.7 kg 90.7 kg   Vitals:   01/08/20 0600 01/08/20  0751  BP: 123/61 125/70  Pulse:  86  Resp:  18  Temp:  97.6 F (36.4 C)  SpO2:  99%    DISCHARGE MEDICATION: Allergies as of 01/08/2020   No Known Allergies     Medication List    STOP taking these medications   enalapril 20 MG tablet Commonly known as: VASOTEC   nitroGLYCERIN 0.2 mg/hr patch Commonly known as: NITRODUR - Dosed in mg/24 hr   ondansetron 4 MG tablet Commonly known as: ZOFRAN   oxyCODONE 5 MG immediate release tablet Commonly known as: Oxy IR/ROXICODONE     TAKE these medications   acetaminophen 325 MG tablet Commonly known as: TYLENOL Take 2 tablets (650 mg total) by mouth every 6 (six) hours as needed for mild pain (or Fever >/= 101).   amoxicillin-clavulanate 875-125 MG tablet Commonly known as: AUGMENTIN Take 1 tablet by mouth 2 (two) times daily.   atorvastatin 80 MG tablet Commonly known as: LIPITOR TAKE 1 TABLET BY MOUTH  DAILY AT 6 PM What changed: See the new instructions.   augmented betamethasone dipropionate 0.05 % cream Commonly known as: DIPROLENE-AF APPLY TO AFFECTED AREA(S)  TOPICALLY TWICE DAILY AS  DIRECTED   cholecalciferol 25 MCG (1000 UNIT) tablet Commonly known as: VITAMIN D3 Take 1,000  Units by mouth at bedtime.   ciprofloxacin 500 MG tablet Commonly known as: CIPRO Take 1 tablet (500 mg total) by mouth 2 (two) times daily for 2 days. Through November 24   docusate sodium 100 MG capsule Commonly known as: COLACE Take 1 capsule (100 mg total) by mouth daily as needed for mild constipation.   empagliflozin 10 MG Tabs tablet Commonly known as: Jardiance Take 1 tablet (10 mg total) by mouth daily.   gabapentin 100 MG capsule Commonly known as: NEURONTIN TAKE 1 CAPSULE BY MOUTH  ONCE DAILY   glipiZIDE 10 MG tablet Commonly known as: GLUCOTROL Take 1 tablet (10 mg total) by mouth at bedtime.   hydrALAZINE 25 MG tablet Commonly known as: APRESOLINE Take 1 tablet (25 mg total) by mouth 3 (three) times daily.     linagliptin 5 MG Tabs tablet Commonly known as: Tradjenta Take 1 tablet (5 mg total) by mouth at bedtime.   metoprolol succinate 25 MG 24 hr tablet Commonly known as: TOPROL-XL Take 0.5 tablets (12.5 mg total) by mouth every evening.   oxyCODONE-acetaminophen 5-325 MG tablet Commonly known as: Percocet Take 1 tablet by mouth every 8 (eight) hours as needed for up to 5 days for severe pain.   pentoxifylline 400 MG CR tablet Commonly known as: TRENTAL TAKE 1 TABLET BY MOUTH 3  TIMES DAILY WITH MEALS   Prodigy No Coding Blood Gluc test strip Generic drug: glucose blood USE TO CHECK BLOOD SUGAR  THREE TIMES DAILY OR AS  DIRECTED   Prodigy Twist Top Lancets 28G Misc CHECK BLOOD SUGAR 3 TIMES  DAILY   triamcinolone 0.1 % Commonly known as: KENALOG APPLY TO AFFECTED AREA(S)  TOPICALLY TWICE DAILY What changed: See the new instructions.            Discharge Care Instructions  (From admission, onward)         Start     Ordered   01/08/20 0000  Discharge wound care:       Comments: Reinforce dressing  Until discontinued      Negative Pressure Wound Therapy - Incisional  Until discontinued      Amount of suction? 125 mm/Hg  Suction Type? Continuous  Remove Dressing? Do not remove dressing   01/08/20 1116         No Known Allergies Discharge Instructions    Diet - low sodium heart healthy   Complete by: As directed    Discharge wound care:   Complete by: As directed    Reinforce dressing  Until discontinued      Negative Pressure Wound Therapy - Incisional  Until discontinued      Amount of suction? 125 mm/Hg  Suction Type? Continuous  Remove Dressing? Do not remove dressing   Increase activity slowly   Complete by: As directed    Negative Pressure Wound Therapy - Incisional   Complete by: As directed    Show patient how to attach prevena vac. Call office if vac alarms or stops working      The results of significant diagnostics from this hospitalization  (including imaging, microbiology, ancillary and laboratory) are listed below for reference.    Significant Diagnostic Studies: DG Chest Port 1 View  Result Date: 01/01/2020 CLINICAL DATA:  Sepsis. EXAM: PORTABLE CHEST 1 VIEW COMPARISON:  12/25/2018 FINDINGS: The cardiac silhouette, mediastinal and hilar contours are within normal limits given the AP projection and portable technique. Mild stable tortuosity of the thoracic aorta. The lungs are clear. No  pleural effusions. No pneumothorax. The bony thorax is intact. IMPRESSION: No acute cardiopulmonary findings. Electronically Signed   By: Rudie Meyer M.D.   On: 01/01/2020 14:34   DG Foot Complete Left  Result Date: 01/01/2020 CLINICAL DATA:  Plantar foot wound. EXAM: LEFT FOOT - COMPLETE 3+ VIEW COMPARISON:  Radiographs 11/02/2019 and MRI left foot 12/06/2019 FINDINGS: There is a new fracture involving the second metatarsal neck. The fourth metatarsal neck fracture seen on the prior x-rays demonstrates interval healing changes. Stable appearing MTP joint subluxations. First MTP joint is maintained. Remote amputation of third ray. The lateral film demonstrates a fairly extensive soft tissue defect involving the plantar aspect of the hindfoot. I do not see any obvious destructive bony changes involving the plantar cortex of the calcaneus. There is some mild lucency in the calcaneus posteriorly. Could not exclude osteomyelitis. IMPRESSION: 1. New fracture involving the second metatarsal neck. 2. Healing fourth metatarsal neck fracture. 3. Large soft tissue defect involving the plantar aspect of the hindfoot. 4. Mild lucency in the calcaneus posteriorly. Could not exclude osteomyelitis. Electronically Signed   By: Rudie Meyer M.D.   On: 01/01/2020 14:41    Microbiology: Recent Results (from the past 240 hour(s))  Blood Culture (routine x 2)     Status: Abnormal   Collection Time: 01/01/20  2:35 PM   Specimen: BLOOD  Result Value Ref Range Status    Specimen Description   Final    BLOOD RIGHT ANTECUBITAL Performed at [facility], [address], [place], [place] [address]    Special Requests   Final    BOTTLES DRAWN AEROBIC AND ANAEROBIC Blood Culture adequate volume Performed at [facility], [address], [place], [place] [address]    Culture  Setup Time   Final    GRAM NEGATIVE RODS IN BOTH AEROBIC AND ANAEROBIC BOTTLES CRITICAL VALUE NOTED.  VALUE IS CONSISTENT WITH PREVIOUSLY REPORTED AND CALLED VALUE. Performed at [facility], [address] [place], [place] [address]    Culture (A)  Final    MORGANELLA MORGANII SUSCEPTIBILITIES PERFORMED ON PREVIOUS CULTURE WITHIN THE LAST 5 DAYS. PROVIDENCIA RETTGERI    Report Status 01/08/2020 FINAL  Final   Organism ID, Bacteria PROVIDENCIA RETTGERI  Final      Susceptibility   Providencia rettgeri - MIC*    AMPICILLIN RESISTANT Resistant     CEFAZOLIN >=64 RESISTANT Resistant     CEFEPIME <=0.12 SENSITIVE Sensitive     CEFTAZIDIME <=1 SENSITIVE Sensitive     CEFTRIAXONE <=0.25 SENSITIVE Sensitive     CIPROFLOXACIN <=0.25 SENSITIVE Sensitive     GENTAMICIN <=1 SENSITIVE Sensitive     IMIPENEM 2 SENSITIVE Sensitive     TRIMETH/SULFA <=20 SENSITIVE Sensitive     AMPICILLIN/SULBACTAM 16 INTERMEDIATE Intermediate     PIP/TAZO <=4 SENSITIVE Sensitive     * PROVIDENCIA RETTGERI  Blood Culture (routine x 2)     Status: Abnormal   Collection Time: 01/01/20  2:50 PM   Specimen: BLOOD  Result Value Ref Range Status   Specimen Description   Final    BLOOD LEFT ANTECUBITAL Performed at [facility], [address], [place], [place] [address]    Special Requests   Final    BOTTLES DRAWN AEROBIC AND ANAEROBIC Blood Culture adequate volume Performed at [facility], [address], [place], [place] [address]    Culture  Setup Time   Final    GRAM NEGATIVE RODS IN  BOTH AEROBIC AND ANAEROBIC  BOTTLES Organism ID to follow CRITICAL RESULT CALLED TO, READ BACK BY AND VERIFIED WITH: Isabel Caprice PharmD 15:05 01/02/20 ([email or web address]) Performed at [facility], [address] [place], [place] [address]    Culture [facility] MORGANII (A)  Final   Report Status 01/04/2020 FINAL  Final   Organism ID, Bacteria MORGANELLA MORGANII  Final      Susceptibility   Morganella morganii - MIC*    AMPICILLIN >=32 RESISTANT Resistant     CEFAZOLIN >=64 RESISTANT Resistant     CEFTAZIDIME <=1 SENSITIVE Sensitive     CIPROFLOXACIN <=0.25 SENSITIVE Sensitive     GENTAMICIN <=1 SENSITIVE Sensitive     IMIPENEM 1 SENSITIVE Sensitive     TRIMETH/SULFA <=20 SENSITIVE Sensitive     AMPICILLIN/SULBACTAM >=32 RESISTANT Resistant     PIP/TAZO <=4 SENSITIVE Sensitive     * MORGANELLA MORGANII  Blood Culture ID Panel (Reflexed)     Status: Abnormal   Collection Time: 01/01/20  2:50 PM  Result Value Ref Range Status   Enterococcus faecalis NOT DETECTED NOT DETECTED Final   Enterococcus Faecium NOT DETECTED NOT DETECTED Final   Listeria monocytogenes NOT DETECTED NOT DETECTED Final   Staphylococcus species NOT DETECTED NOT DETECTED Final   Staphylococcus aureus (BCID) NOT DETECTED NOT DETECTED Final   Staphylococcus epidermidis NOT DETECTED NOT DETECTED Final   Staphylococcus lugdunensis NOT DETECTED NOT DETECTED Final   Streptococcus species NOT DETECTED NOT DETECTED Final   Streptococcus agalactiae NOT DETECTED NOT DETECTED Final   Streptococcus pneumoniae NOT DETECTED NOT DETECTED Final   Streptococcus pyogenes NOT DETECTED NOT DETECTED Final   A.calcoaceticus-baumannii NOT DETECTED NOT DETECTED Final   Bacteroides fragilis NOT DETECTED NOT DETECTED Final   Enterobacterales DETECTED (A) NOT DETECTED Final    Comment: Enterobacterales represent a large order of gram negative bacteria, not a single organism. Refer to culture for further identification. CRITICAL RESULT CALLED TO, READ BACK BY AND  VERIFIED WITH: Isabel Caprice PharmD 15:05 01/02/20 ([email or web address])    Enterobacter cloacae complex NOT DETECTED NOT DETECTED Final   Escherichia coli NOT DETECTED NOT DETECTED Final   Klebsiella aerogenes NOT DETECTED NOT DETECTED Final   Klebsiella oxytoca NOT DETECTED NOT DETECTED Final   Klebsiella pneumoniae NOT DETECTED NOT DETECTED Final   Proteus species NOT DETECTED NOT DETECTED Final   Salmonella species NOT DETECTED NOT DETECTED Final   Serratia marcescens NOT DETECTED NOT DETECTED Final   Haemophilus influenzae NOT DETECTED NOT DETECTED Final   Neisseria meningitidis NOT DETECTED NOT DETECTED Final   Pseudomonas aeruginosa NOT DETECTED NOT DETECTED Final   Stenotrophomonas maltophilia NOT DETECTED NOT DETECTED Final   Candida albicans NOT DETECTED NOT DETECTED Final   Candida auris NOT DETECTED NOT DETECTED Final   Candida glabrata NOT DETECTED NOT DETECTED Final   Candida krusei NOT DETECTED NOT DETECTED Final   Candida parapsilosis NOT DETECTED NOT DETECTED Final   Candida tropicalis NOT DETECTED NOT DETECTED Final   Cryptococcus neoformans/gattii NOT DETECTED NOT DETECTED Final   CTX-M ESBL NOT DETECTED NOT DETECTED Final   Carbapenem resistance IMP NOT DETECTED NOT DETECTED Final   Carbapenem resistance KPC NOT DETECTED NOT DETECTED Final   Carbapenem resistance NDM NOT DETECTED NOT DETECTED Final   Carbapenem resist OXA 48 LIKE NOT DETECTED NOT DETECTED Final   Carbapenem resistance VIM NOT DETECTED NOT DETECTED Final    Comment: Performed at [facility], [address] [place], [place] [address]  Respiratory Panel  by RT PCR (Flu A&B, Covid) - Nasopharyngeal Swab     Status: None   Collection Time: 01/01/20  3:22 PM   Specimen: Nasopharyngeal Swab  Result Value Ref Range Status   SARS Coronavirus 2 by RT PCR NEGATIVE NEGATIVE Final    Comment: (NOTE) SARS-CoV-2 target nucleic acids are NOT DETECTED.  The SARS-CoV-2 RNA is generally detectable in upper  respiratoy specimens during the acute phase of infection. The lowest concentration of SARS-CoV-2 viral copies this assay can detect is 131 copies/mL. A negative result does not preclude SARS-Cov-2 infection and should not be used as the sole basis for treatment or other patient management decisions. A negative result may occur with  improper specimen collection/handling, submission of specimen other than nasopharyngeal swab, presence of viral mutation(s) within the areas targeted by this assay, and inadequate number of viral copies (<131 copies/mL). A negative result must be combined with clinical observations, patient history, and epidemiological information. The expected result is Negative.  Fact Sheet for Patients:  https://www.moore.com/https://www.fda.gov/media/142436/download  Fact Sheet for Healthcare Providers:  https://www.young.biz/https://www.fda.gov/media/142435/download  This test is no t yet approved or cleared by the [place] FDA and  has been authorized for detection and/or diagnosis of SARS-CoV-2 by FDA under an Emergency Use Authorization (EUA). This EUA will remain  in effect (meaning this test can be used) for the duration of the COVID-19 declaration under Section 564(b)(1) of the Act, 21 U.S.C. section 360bbb-3(b)(1), unless the authorization is terminated or revoked sooner.     Influenza A by PCR NEGATIVE NEGATIVE Final   Influenza B by PCR NEGATIVE NEGATIVE Final    Comment: (NOTE) The Xpert Xpress SARS-CoV-2/FLU/RSV assay is intended as an aid in  the diagnosis of influenza from Nasopharyngeal swab specimens and  should not be used as a sole basis for treatment. Nasal washings and  aspirates are unacceptable for Xpert Xpress SARS-CoV-2/FLU/RSV  testing.  Fact Sheet for Patients: https://www.moore.com/https://www.fda.gov/media/142436/download  Fact Sheet for Healthcare Providers: https://www.young.biz/https://www.fda.gov/media/142435/download  This test is not yet approved or cleared by the [place] FDA and  has been  authorized for detection and/or diagnosis of SARS-CoV-2 by  FDA under an Emergency Use Authorization (EUA). This EUA will remain  in effect (meaning this test can be used) for the duration of the  Covid-19 declaration under Section 564(b)(1) of the Act, 21  U.S.C. section 360bbb-3(b)(1), unless the authorization is  terminated or revoked. Performed at [facility], [address] [place], [place] [address]   Urine Culture     Status: Abnormal   Collection Time: 01/01/20 11:10 PM   Specimen: Urine, Random  Result Value Ref Range Status   Specimen Description URINE, RANDOM  Final   Special Requests NONE  Final   Culture (A)  Final    <10,000 COLONIES/mL INSIGNIFICANT GROWTH Performed at [facility], [address] [place], [place] [address]    Report Status 01/02/2020 FINAL  Final  Surgical pcr screen     Status: None   Collection Time: 01/02/20  5:48 AM   Specimen: Nasal Mucosa; Nasal Swab  Result Value Ref Range Status   MRSA, PCR NEGATIVE NEGATIVE Final   Staphylococcus aureus NEGATIVE NEGATIVE Final    Comment: (NOTE) The Xpert SA Assay (FDA approved for NASAL specimens in patients 71 years of age and older), is one component of a comprehensive surveillance program. It is not intended to diagnose infection nor to guide or monitor treatment. Performed at [facility], [address], [place], [place] [address]   SARS Coronavirus 2 by RT PCR (hospital order, performed in [facility] hospital lab) Nasopharyngeal Nasopharyngeal Swab     Status: None   Collection Time: 01/07/20  4:29 PM   Specimen: Nasopharyngeal Swab  Result Value Ref Range Status   SARS Coronavirus 2 NEGATIVE NEGATIVE Final    Comment: (NOTE) SARS-CoV-2 target nucleic acids are NOT DETECTED.  The SARS-CoV-2 RNA is generally detectable in upper and lower respiratory specimens during the acute phase of infection. The lowest concentration of SARS-CoV-2 viral copies this assay  can detect is 250 copies / mL. A negative result does not preclude SARS-CoV-2 infection and should not be used as the sole basis for treatment or other patient management decisions.  A negative result may occur with improper specimen collection / handling, submission of specimen other than nasopharyngeal swab, presence of viral mutation(s) within the areas targeted by this assay, and inadequate number of viral copies (<250 copies / mL). A negative result must be combined with clinical observations, patient history, and epidemiological information.  Fact Sheet for Patients:   BoilerBrush.com.cy  Fact Sheet for Healthcare Providers: https://pope.com/  This test is not yet approved or  cleared by the [place] FDA and has been authorized for detection and/or diagnosis of SARS-CoV-2 by FDA under an Emergency Use Authorization (EUA).  This EUA will remain in effect (meaning this test can be used) for the duration of the COVID-19 declaration under Section 564(b)(1) of the Act, 21 U.S.C. section 360bbb-3(b)(1), unless the authorization is terminated or revoked sooner.  Performed at [facility], [address], [place], [place] [address]      Labs: CBC: Recent Labs  Lab 01/01/20 1435 01/01/20 1435 01/02/20 0450 01/04/20 0411 01/05/20 0231 01/06/20 0152 01/07/20 0655  WBC 13.4*   < > 10.1 7.4 6.8 8.0 7.8  NEUTROABS 11.6*  --   --   --   --   --   --   HGB 9.5*   < > 9.4* 8.9* 8.2* 8.3* 8.8*  HCT 31.4*   < > 30.6* 30.2* 28.1* 28.2* 29.1*  MCV 96.0   < > 94.7 95.6 97.2 95.9 95.4  PLT 336   < > 289 347 338 330 370   < > = values in this interval not displayed.   Basic Metabolic Panel: Recent Labs  Lab 01/03/20 0211 01/04/20 0411 01/05/20 0231 01/06/20 0152 01/07/20 0655  NA 143 144 145 143 144  K 4.4 4.7 4.2 4.2 4.5  CL 112* 113* 115* 112* 112*  CO2 23 20* 27 23 25   GLUCOSE 80 128* 105* 103* 90  BUN 36* 34* 28*  23 19  CREATININE 1.32* 1.27* 1.22 1.09 1.15  CALCIUM 8.2* 8.3* 8.1* 8.2* 8.6*   Liver Function Tests: Recent Labs  Lab 01/01/20 1435 01/02/20 0450 01/03/20 0211  AST 82* 83* 78*  ALT 35 35 41  ALKPHOS 58 45 48  BILITOT 1.4* 1.1 0.6  PROT 7.2 5.8* 5.9*  ALBUMIN 2.6* 2.0* 1.9*   CBG: Recent Labs  Lab 01/07/20 0655 01/07/20 1121 01/07/20 1648 01/07/20 2033 01/08/20 0645  GLUCAP 78 101* 101* 110* 87    Time spent: 35 minutes  Signed:  01/10/20  [facility]  01/08/2020 11:22 AM

## 2020-01-08 NOTE — TOC Transition Note (Signed)
Transition of Care [facility] - CM/SW Discharge Note   Patient Details  Name: [name] MRN: [id number] Date of Birth: [birth year]  Transition of Care [facility] CM/SW Contact:  Janae Bridgeman, RN Phone Number: 01/08/2020, 12:45 PM   Clinical Narrative:    Case management spoke with [facility] healthcare SNF and the patient will be able to transfer to the [facility] today via PT[facility] after his COVID booster administration.  I placed all discharge clinicals in the hub for the patient's discharge.  Tammy, RN is going to call report to [facility] healthcare SNF at [phone] for room number 124 B.  The patient received his COVID booster at 1230 today.  I spoke with the patient and notified him of his transfer to the facility today.  I called the patient's wife and notified her of the patient's transfer to the facility as well.  The patient states that he will call his wife when he arrives at [facility] today.  PTAR was called and transportation was arranged for 3 pm today.  Will continue to follow for discharge needs.   Final next level of care: IP Rehab Facility Barriers to Discharge: Continued Medical Work up   Patient Goals and CMS Choice Patient states their goals for this hospitalization and ongoing recovery are:: Patient plans to discharge to CIR for rehabilitation. CMS Medicare.gov Compare Post Acute Care list provided to:: Patient Choice offered to / list presented to : Patient  Discharge Placement                       Discharge Plan and Services   Discharge Planning Services: CM Consult Post Acute Care Choice: IP Rehab                               Social Determinants of Health (SDOH) Interventions     Readmission Risk Interventions Readmission Risk Prevention Plan 01/04/2020 12/29/2018  Post Dischage Appt Complete Not Complete  Appt Comments - dispo pending  Medication Screening Complete Complete  Transportation Screening Complete  Complete  Some recent data might be hidden

## 2020-01-08 NOTE — Progress Notes (Signed)
Called PT[facility] to see an ETA regarding picking up patient. They said he is the next one on the list once the truck comes back.

## 2020-01-08 NOTE — Progress Notes (Signed)
° °  Covid-19 Vaccination Clinic  Name:  Chad Hall    MRN: [id number] DOB: June 05, 1950  01/08/2020  [name] was observed post Covid-19 immunization for 15 minutes without incident. He was provided with Vaccine Information Sheet and instruction to access the V-Safe system.   [name] was instructed to call 911 with any severe reactions post vaccine:  Difficulty breathing   Swelling of face and throat   A fast heartbeat   A bad rash all over body   Dizziness and weakness   Immunizations Administered    Name Date Dose VIS Date Route   Pfizer COVID-19 Vaccine 01/08/2020 12:14 PM 0.3 mL 12/06/2019 Intramuscular   Manufacturer: [employer], [employer]   Lot: [id number]   NDC: [address]-1

## 2020-01-08 NOTE — Plan of Care (Signed)
Plan for patient is to be discharged to [facility]. Waiting for PT[facility] to come pick up the patient. Will continue to monitor.

## 2020-01-08 NOTE — Progress Notes (Signed)
Switched patient over from hospital wound vac to portable Prevena Plus 125 wound vac. Power on and device is functioning WNL. Will continue to monitor.

## 2020-01-08 NOTE — Progress Notes (Signed)
Pt waiting for PTAR transport to take to SNF/[facility].  PTAR was called by unit NS to verify pt was on the list for pickup.  Pt is 10th on the list for pick up.  Attempted to call report to facility at this time, unable to reach anyone, line rings infinitely then recording to try your call again later. Attempted x2 without success.   Pt made aware of plan still for pick up, just do not know time of pickup.

## 2020-01-08 NOTE — Progress Notes (Signed)
Report called to Richardine Service, Charity fundraiser at [facility], [address]. Still waiting for PTAR to arrive to come get the patient.

## 2020-01-08 NOTE — Progress Notes (Signed)
2347 Patient discharged with PTAR to [facility] via stretcher with urinal, prevena plus portable wound vac, and all belongings. AVS given to PTAR; bilateral IVs removed.

## 2020-01-08 NOTE — Care Management Important Message (Signed)
Important Message  Patient Details  Name: Chad Hall MRN: [id number] Date of Birth: 10-29-1950   Medicare Important Message Given:  Yes     [name] [name] 01/08/2020, 3:09 PM

## 2020-01-08 NOTE — Progress Notes (Signed)
Physical Therapy Treatment Patient Details Name: Chad Hall MRN: [id number] DOB: 14-Sep-1950 Today's Date: 01/08/2020    History of Present Illness Pt is a 69 y.o. male admitted 01/01/20 with chronic L foot ulcer with drainage and pain; workup for L foot osteomyelitis. S/p L foot transtibial amputation on 11/17. PMH includes blindness, HTN, DM2, Marfan syndrome.    PT Comments    Pt supine on arrival, agreeable to therapy session and with good participation and tolerance for mobility. Pt performed bed mobility with Supervision, sit<>stand from elevated bed height to RW with minA and stand pivot transfer from EOB>chair with minA, with verbal/tactile cues for safety/positioning and RW assist due to pt blindness. Pt performed supine/sidelying/seated BLE AROM therapeutic exercises with good tolerance as detailed below, encouraged to perform TID as able. Anticipate pt may need +2 assist with Stedy for return transfer from chair>bed, pt/NT/RN notified and written on white board. Pt given call bell and encouraged to use when needing to return to bed, RN notified chair alarm box plugged in but does not appear to be working properly (monitor won't turn on and green light not lit). Pt continues to benefit from PT services to progress toward functional mobility goals. D/C recs below remain appropriate.  Follow Up Recommendations  CIR;Supervision for mobility/OOB     Equipment Recommendations  Rolling walker with 5" wheels;3in1 (PT);Wheelchair (measurements PT);Wheelchair cushion (measurements PT);Other (comment) (defer to next facility if not home)    Recommendations for Other Services Rehab consult     Precautions / Restrictions Precautions Precautions: Fall;Other (comment) Precaution Comments: L BKA wound vac; blind Required Braces or Orthoses: Other Brace (LLE limb guard) Restrictions Weight Bearing Restrictions: Yes LLE Weight Bearing: Non weight bearing    Mobility  Bed  Mobility Overal bed mobility: Needs Assistance Bed Mobility: Supine to Sit     Supine to sit: Supervision;HOB elevated     General bed mobility comments: Assist for line management  Transfers Overall transfer level: Needs assistance Equipment used: Rolling walker (2 wheeled) Transfers: Sit to/from [employer] Sit to Stand: From elevated surface;Min assist Stand pivot transfers: Min assist;From elevated surface       General transfer comment: bed height elevated, pt unable to stand from lowest bed height or 2" elevated (pt is 55ft 5"), once bed raised pt able to stand with +59minA, will likely need +2 and Stedy to stand from low chair height, RN/NT notified  Ambulation/Gait                 Stairs             Wheelchair Mobility    Modified Rankin (Stroke Patients Only)       Balance Overall balance assessment: Needs assistance Sitting-balance support: Feet supported;No upper extremity supported Sitting balance-Leahy Scale: Fair Sitting balance - Comments: Required assist to don R shoe   Standing balance support: Bilateral upper extremity supported;During functional activity Standing balance-Leahy Scale: Poor Standing balance comment: reliant on BUE support and external assist                            Cognition Arousal/Alertness: Awake/alert Behavior During Therapy: WFL for tasks assessed/performed Overall Cognitive Status: Within Functional Limits for tasks assessed  Exercises Amputee Exercises Gluteal Sets: AROM;Strengthening;Both;10 reps;Supine Towel Squeeze: AROM;Strengthening;Both;10 reps;Supine Hip Extension: AROM;Left;Strengthening;10 reps;Supine Hip ABduction/ADduction: AROM;Strengthening;Left;10 reps;Supine;Sidelying Knee Extension: AROM;Right;10 reps;Seated (LAQ on RLE; LLE remained in limb guard) Straight Leg Raises: AROM;Left;10 reps;Supine Chair Push Up:  AROM;Strengthening;Both;5 reps;Seated (to place pillow under his hips)    General Comments General comments (skin integrity, edema, etc.): limb guard remained donned to LLE t/o session      Pertinent Vitals/Pain Pain Assessment: No/denies pain Pain Location: LLE Pain Intervention(s): Monitored during session;Premedicated before session;Repositioned   Vitals:   01/08/20 0751  BP: 125/70  Pulse: 86  Resp: 18  Temp: 97.6 F (36.4 C)  SpO2: 99%    Home Living                      Prior Function            PT Goals (current goals can now be found in the care plan section) Acute Rehab PT Goals Patient Stated Goal: Post-acute rehab to regain independence before return home PT Goal Formulation: With patient Time For Goal Achievement: 01/18/20 Potential to Achieve Goals: Good Progress towards PT goals: Progressing toward goals    Frequency    Min 3X/week      PT Plan Current plan remains appropriate    Co-evaluation              AM-PAC PT "6 Clicks" Mobility   Outcome Measure  Help needed turning from your back to your side while in a flat bed without using bedrails?: A Little Help needed moving from lying on your back to sitting on the side of a flat bed without using bedrails?: A Little Help needed moving to and from a bed to a chair (including a wheelchair)?: A Little Help needed standing up from a chair using your arms (e.g., wheelchair or bedside chair)?: A Lot Help needed to walk in hospital room?: Total Help needed climbing 3-5 steps with a railing? : Total 6 Click Score: 13    End of Session Equipment Utilized During Treatment: Gait belt Activity Tolerance: Patient tolerated treatment well Patient left: in chair;with call bell/phone within reach;Other (comment) (RN notified pt chair alarm not working, pt has call bell) Nurse Communication: Mobility status;Need for lift equipment (+2 with Stedy for return to EOB) PT Visit Diagnosis: Other  abnormalities of gait and mobility (R26.89);Muscle weakness (generalized) (M62.81)     Time: 8466-5993 PT Time Calculation (min) (ACUTE ONLY): 38 min  Charges:  $Therapeutic Exercise: 23-37 mins $Therapeutic Activity: 8-22 mins                     Chad Nigh P., Chad Hall Acute Rehabilitation Services Pager: [phone] Office: [phone]   Chad Hall 01/08/2020, 12:30 PM

## 2020-01-17 ENCOUNTER — Encounter: Payer: Self-pay | Admitting: Family

## 2020-01-17 ENCOUNTER — Ambulatory Visit (INDEPENDENT_AMBULATORY_CARE_PROVIDER_SITE_OTHER): Payer: Medicare Other | Admitting: Family

## 2020-01-17 VITALS — Ht 77.0 in | Wt 199.0 lb

## 2020-01-17 DIAGNOSIS — Z89512 Acquired absence of left leg below knee: Secondary | ICD-10-CM

## 2020-01-17 NOTE — Progress Notes (Signed)
Post-Op Visit Note   Patient: Chad Hall           Date of Birth: [birth year]           MRN: [id number] Visit Date: 01/17/2020 PCP: [name], MD  Chief Complaint:  Chief Complaint  Patient presents with  . Left Leg - Routine Post Op    01/03/20 left BKA     HPI:  HPI The patient is a 69 year old gentleman seen status post left below-knee amputation on November 17.  His wound VAC was removed yesterday by skilled nursing as it was not functioning  He is currently residing at a skilled nursing with [facility] health care.  Of note he is also legally blind.    Ortho Exam On examination of the left residual limb this is well approximated with staples along the incision there is no gaping drainage no erythema very minimal edema healing quite well  Visit Diagnoses: No diagnosis found.  Plan: Begin daily Dial soap cleansing.  Dry dressing changes.  Wear shrinker once obtained.  Did provide an order to [facility] clinic for prosthesis set up.  He will follow-up in the office in 2 weeks for staple removal  He is concerned about transportation issues to and from [place] [facility].  If necessary [facility] could harvest the staples in the next 1 to 2 weeks  Follow-Up Instructions: No follow-ups on file.   Imaging: No results found.  Orders:  No orders of the defined types were placed in this encounter.  No orders of the defined types were placed in this encounter.    PMFS History: Patient Active Problem List   Diagnosis Date Noted  . Diabetic foot infection (HCC) 01/01/2020  . Maggot infestation 08/23/2019  . Pyogenic inflammation of bone (HCC)   . Osteomyelitis of foot, left, acute (HCC) 12/26/2018  . Right knee pain 10/04/2017  . AKI (acute kidney injury) (HCC) 07/18/2013  . Left hip pain 04/10/2013  . Diabetic peripheral neuropathy associated with type 2 diabetes mellitus (HCC) 11/10/2012  . Marfan's syndrome 08/08/2012  . Healthcare maintenance  08/08/2012  . Atopic dermatitis 02/16/2012  . Overweight (BMI 25.0-29.9) 07/05/2011  . Allergic rhinitis due to allergen 07/03/2011  . Venous insufficiency of leg 04/03/2011  . Blindness 07/23/2010  . DM (diabetes mellitus), type 2 with neurological complications (HCC) 06/13/2010  . Hypertension 06/13/2010  . Hyperlipidemia 06/13/2010   Past Medical History:  Diagnosis Date  . Allergy   . Blindness - both eyes 1967   Basketball injury  . Diabetes mellitus   . Hernia   . Hyperlipidemia   . Hypertension   . Marfan syndrome     Family History  Problem Relation Age of Onset  . Heart disease Mother   . Breast cancer Sister     Past Surgical History:  Procedure Laterality Date  . AMPUTATION Left 12/28/2018   Procedure: LEFT FOOT 3RD RAY AMPUTATION;  Surgeon: Nadara Mustard, MD;  Location: [facility] OR;  Service: Orthopedics;  Laterality: Left;  . AMPUTATION Left 01/03/2020   Procedure: LEFT BELOW KNEE AMPUTATION;  Surgeon: Nadara Mustard, MD;  Location: [facility] OR;  Service: Orthopedics;  Laterality: Left;  . APPENDECTOMY    . INGUINAL HERNIA REPAIR     Right 1970s,    Left 2011 by Dr Andrey Campanile  . INGUINAL HERNIA REPAIR  08/25/10   recurrent left  . PENILE PROSTHESIS IMPLANT  2000s   [facility], Dr Brunilda Payor  . RETINAL DETACHMENT SURGERY  1970  . Rotator Cuff Repair  2009   Left side, Dr Elita Quick   . ROTATOR CUFF REPAIR  2010-2011?   right side   Social History   Occupational History  . 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 Smoking status: Former Smoker    Quit date: 02/14/1984    Years since quitting: 35.9  . Smokeless tobacco: Never Used  Substance and Sexual Activity  . Alcohol use: No    Comment: Quit in 10/1983  . Drug use: No  . Sexual activity: Not on file

## 2020-01-18 ENCOUNTER — Ambulatory Visit: Payer: Medicare Other | Admitting: Podiatry

## 2020-01-25 ENCOUNTER — Other Ambulatory Visit: Payer: Self-pay

## 2020-01-25 ENCOUNTER — Ambulatory Visit (INDEPENDENT_AMBULATORY_CARE_PROVIDER_SITE_OTHER): Payer: Medicare Other | Admitting: Internal Medicine

## 2020-01-25 ENCOUNTER — Encounter: Payer: Self-pay | Admitting: Internal Medicine

## 2020-01-25 VITALS — BP 119/71 | HR 79 | Temp 97.6°F

## 2020-01-25 DIAGNOSIS — L089 Local infection of the skin and subcutaneous tissue, unspecified: Secondary | ICD-10-CM

## 2020-01-25 DIAGNOSIS — E11628 Type 2 diabetes mellitus with other skin complications: Secondary | ICD-10-CM

## 2020-01-25 NOTE — Patient Instructions (Signed)
You have a bka amputation on the left leg Your infection has been removed and no longer need antibiotics   Follow up with dr Lajoyce Corners as previously arranged No need for follow up with id clinic

## 2020-01-25 NOTE — Progress Notes (Signed)
[facility]  Reason for Consult:diabetic foot ulcer/infection Referring Provider: Baltazar Najjar     Patient Active Problem List   Diagnosis Date Noted  . Diabetic foot infection (HCC) 01/01/2020  . Maggot infestation 08/23/2019  . Pyogenic inflammation of bone (HCC)   . Osteomyelitis of foot, left, acute (HCC) 12/26/2018  . Right knee pain 10/04/2017  . AKI (acute kidney injury) (HCC) 07/18/2013  . Left hip pain 04/10/2013  . Diabetic peripheral neuropathy associated with type 2 diabetes mellitus (HCC) 11/10/2012  . Marfan's syndrome 08/08/2012  . Healthcare maintenance 08/08/2012  . Atopic dermatitis 02/16/2012  . Overweight (BMI 25.0-29.9) 07/05/2011  . Allergic rhinitis due to allergen 07/03/2011  . Venous insufficiency of leg 04/03/2011  . Blindness 07/23/2010  . DM (diabetes mellitus), type 2 with neurological complications (HCC) 06/13/2010  . Hypertension 06/13/2010  . Hyperlipidemia 06/13/2010    Patient's Medications  New Prescriptions   No medications on file  Previous Medications   ACETAMINOPHEN (TYLENOL) 325 MG TABLET    Take 2 tablets (650 mg total) by mouth every 6 (six) hours as needed for mild pain (or Fever >/= 101).   ATORVASTATIN (LIPITOR) 80 MG TABLET    TAKE 1 TABLET BY MOUTH  DAILY AT 6 PM   AUGMENTED BETAMETHASONE DIPROPIONATE (DIPROLENE-AF) 0.05 % CREAM    APPLY TO AFFECTED AREA(S)  TOPICALLY TWICE DAILY AS  DIRECTED   CHOLECALCIFEROL (VITAMIN D3) 25 MCG (1000 UT) TABLET    Take 1,000 Units by mouth at bedtime.   DOCUSATE SODIUM (COLACE) 100 MG CAPSULE    Take 1 capsule (100 mg total) by mouth daily as needed for mild constipation.   EMPAGLIFLOZIN (JARDIANCE) 10 MG TABS TABLET    Take 1 tablet (10 mg total) by mouth daily.   GABAPENTIN (NEURONTIN) 100 MG CAPSULE    TAKE 1 CAPSULE BY MOUTH  ONCE DAILY   GLIPIZIDE (GLUCOTROL) 10 MG TABLET    Take 1 tablet (10 mg total) by mouth at bedtime.   HYDRALAZINE  (APRESOLINE) 25 MG TABLET    Take 1 tablet (25 mg total) by mouth 3 (three) times daily.   LINAGLIPTIN (TRADJENTA) 5 MG TABS TABLET    Take 1 tablet (5 mg total) by mouth at bedtime.   METOPROLOL SUCCINATE (TOPROL-XL) 25 MG 24 HR TABLET    Take 0.5 tablets (12.5 mg total) by mouth every evening.   PENTOXIFYLLINE (TRENTAL) 400 MG CR TABLET    TAKE 1 TABLET BY MOUTH 3  TIMES DAILY WITH MEALS   PRODIGY NO CODING BLOOD GLUC TEST STRIP    USE TO CHECK BLOOD SUGAR  THREE TIMES DAILY OR AS  DIRECTED   PRODIGY TWIST TOP LANCETS 28G MISC    CHECK BLOOD SUGAR 3 TIMES  DAILY   TRIAMCINOLONE CREAM (KENALOG) 0.1 %    APPLY TO AFFECTED AREA(S)  TOPICALLY TWICE DAILY  Modified Medications   No medications on file  Discontinued Medications   No medications on file    HPI: BURRELL HODAPP is a 69 y.o. male marfan, dm2 with retinopathy/neuropathy, ckd3, chronic left foot ulcer referred here for left heel ulcer of 2-3 months with mri concern of osteomyelitis   12/9 f/u id Patient had worsening left foot infection and undergone bka 11/17. He is in nursing home now but is still taking amox-clav No pain in his stump No f/c/n/v/diarrhea  Background: -------  Patient is being followed by ortho, podiatry, and wound clinic.  He is referred here by wound care dr Leanord Hawking  He had prior partial rays. He has had ulcer on the plantar/dorsum forefoot for several months. 2-3 months ago another ulcer developed over the calcaneal area. He had positive probe to the bone early 11/2019 and 10/20 mri show imaging evidence suggestive of early om. He had bedside tissue biopsy/path sent. The culture didn't grow anything. Unfortunately it doesn't appear the pathology was done  He wasn't on antibiotics prior to the procedure  He is just given amox-clav 14 days and referred here for evaluation of osteomyelitis and treatment   Per the note he has a recent ABI on the left LE of 1.03 and Dr Lajoyce Corners didn't think he has arterial  insufficiency  His last visit on 12/08/19 at the wound clinic he has bedside debridement of the forefoot ulcer and the heel.   I reviewed his prior culture, and it doesn't appear he has mdr or mrsa  His sugar at home is 80s-130s; a1c in the 5's. He feels well otherwise  He is blind in both eyes and wants to preserve his foot  He lives with his wife  Review of Systems: ROS Negative 11 point ros unless mentioned above   Past Medical History:  Diagnosis Date  . Allergy   . Blindness - both eyes 1967   Basketball injury  . Diabetes mellitus   . Hernia   . Hyperlipidemia   . Hypertension   . Marfan syndrome     Social History   Tobacco Use  . Smoking status: Former Smoker    Quit date: 02/14/1984    Years since quitting: 35.9  . Smokeless tobacco: Never Used  Substance Use Topics  . Alcohol use: No    Comment: Quit in 10/1983  . Drug use: No    Family History  Problem Relation Age of Onset  . Heart disease Mother   . Breast cancer Sister    No Known Allergies  OBJECTIVE: There were no vitals filed for this visit. There is no height or weight on file to calculate BMI.   Physical Exam Tall male, in sun glasses, no distress, pleasant, conversant Heent: atraumatic, eomi, oropharynx clear Neck supple cv rrr no mrg Lungs clear to auscultation, normal respiratory effort abd s/nt Ext no edema Skin; left LE s/p bka; staples intact; no wound dehiscence, swelling, erythyma or tendernss msk no synovitis Neuro: blind; cn2-12 intact otherwise; strenght intact  Lab: 09/2019 a1c 5.4  Microbiology: No results found for this or any previous visit (from the past 240 hour(s)). 12/08/19 left heel tissue cx (bedside deep debridement) Group b strep prrevotella  Serology:  Imaging: 10/20 mri left foot   Assessment/plan: Diabetic foot ulcers with mri imaging of OM. Positive probe to bone and tissue cx with microbes  Agree this is likely OM. The distinction of  acute and chronic is rather hard at times, but in this case would say chronic  He had bedside bone debridement it appears on 10/22. I discussed with him calcaneal OM is tricky to manage and often patients end up with bka even though ideal conditions (off loading, good dm2 control, targetted therapy) are achieved.  In terms of oral vs iv, there is no strong evidence one is superior. While betalactam oral absorption is not the best, for dm foot om, we can try this and follow. If clinically he is heading in wrong direction will repeat mri and see if further debridment/culture is needed  12/09 assessment As he  is s/p bka, the infection is no longer there. The stump doesn't appear infected. No further need for abx   Stop amox clav No need for f/u id clinic     Patient Active Problem List   Diagnosis Date Noted  . Diabetic foot infection (HCC) 01/01/2020  . Maggot infestation 08/23/2019  . Pyogenic inflammation of bone (HCC)   . Osteomyelitis of foot, left, acute (HCC) 12/26/2018  . Right knee pain 10/04/2017  . AKI (acute kidney injury) (HCC) 07/18/2013  . Left hip pain 04/10/2013  . Diabetic peripheral neuropathy associated with type 2 diabetes mellitus (HCC) 11/10/2012  . Marfan's syndrome 08/08/2012  . Healthcare maintenance 08/08/2012  . Atopic dermatitis 02/16/2012  . Overweight (BMI 25.0-29.9) 07/05/2011  . Allergic rhinitis due to allergen 07/03/2011  . Venous insufficiency of leg 04/03/2011  . Blindness 07/23/2010  . DM (diabetes mellitus), type 2 with neurological complications (HCC) 06/13/2010  . Hypertension 06/13/2010  . Hyperlipidemia 06/13/2010     Problem List Items Addressed This Visit   None      I am having [name] maintain his cholecalciferol, acetaminophen, docusate sodium, Prodigy Twist Top Lancets 28G, metoprolol succinate, glipiZIDE, Prodigy No Coding Blood Gluc, atorvastatin, augmented betamethasone dipropionate, triamcinolone, linagliptin,  empagliflozin, gabapentin, pentoxifylline, and hydrALAZINE.   No orders of the defined types were placed in this encounter.    Follow-up: No follow-ups on file.  Raymondo Band, MD [facility] -- -- pager   [phone] cell 01/25/2020, 11:17 AM

## 2020-01-29 ENCOUNTER — Telehealth: Payer: Self-pay

## 2020-01-29 ENCOUNTER — Other Ambulatory Visit: Payer: Self-pay | Admitting: Cardiothoracic Surgery

## 2020-01-29 DIAGNOSIS — I351 Nonrheumatic aortic (valve) insufficiency: Secondary | ICD-10-CM

## 2020-01-29 NOTE — Telephone Encounter (Signed)
I called to give verbal ok for order as requested below.

## 2020-01-29 NOTE — Telephone Encounter (Signed)
Tonya from [facility] called she stated the nursing frequency is now 1 month 2 and 1 PRN [phone]

## 2020-01-31 ENCOUNTER — Telehealth: Payer: Self-pay | Admitting: Orthopedic Surgery

## 2020-01-31 ENCOUNTER — Ambulatory Visit (INDEPENDENT_AMBULATORY_CARE_PROVIDER_SITE_OTHER): Payer: Medicare Other | Admitting: Physician Assistant

## 2020-01-31 ENCOUNTER — Encounter: Payer: Self-pay | Admitting: Physician Assistant

## 2020-01-31 VITALS — Ht 77.0 in | Wt 199.0 lb

## 2020-01-31 DIAGNOSIS — Z89512 Acquired absence of left leg below knee: Secondary | ICD-10-CM

## 2020-01-31 NOTE — Telephone Encounter (Signed)
Received call from Kreg Shropshire (PT) with [facility] needing HHPT orders for 1 Wk 2 and twice a wk for 6 weeks   The number to contact Jae Dire is [phone]

## 2020-01-31 NOTE — Telephone Encounter (Signed)
I called and sw PT to advise verbal ok for orders as requested below.

## 2020-01-31 NOTE — Progress Notes (Addendum)
Office Visit Note   Patient: Chad Hall           Date of Birth: 1950/06/28           MRN: [id number] Visit Date: 01/31/2020              Requested by: [name], MD [address] [place],  [place] [address] PCP: [name], MD  Chief Complaint  Patient presents with  . Left Leg - Routine Post Op    01/03/20 left BKA       HPI: Patient is 4 weeks status post left below-knee amputation.  He is doing overall well.  He is visually impaired.  He has an appointment with [facility]  Assessment & Plan: Visit Diagnoses: No diagnosis found.  Plan: Patient will go forward with removal of staples as well as going to [facility] to begin molding for a prosthetic.  Imperative that he work with our physical therapist given his visual impairment.  I will put the orders in today.  Patient is a new left transtibial  amputee.  Patient's current comorbidities are not expected to impact the ability to function with the prescribed prosthesis. Patient verbally communicates a strong desire to use a prosthesis. Patient currently requires mobility aids to ambulate without a prosthesis.  Expects not to use mobility aids with a new prosthesis.  Patient is a K2 level ambulator that will use a prosthesis to walk around their home and the community over low level environmental barriers.     Follow-Up Instructions: No follow-ups on file.   Ortho Exam  Patient is alert, oriented, no adenopathy, well-dressed, normal affect, normal respiratory effort. Left below-knee amputation stump well apposed wound edges no drainage incision is healed staples are in place.  No cellulitis.  No signs of infection.  Imaging: No results found.   Labs: Lab Results  Component Value Date   HGBA1C 5.3 01/01/2020   HGBA1C 5.4 09/26/2019   HGBA1C 7.0 05/04/2019   ESRSEDRATE 108 (H) 12/21/2019   CRP 35.5 (H) 12/21/2019   REPTSTATUS 01/02/2020 FINAL 01/01/2020   GRAMSTAIN NO WBC SEEN RARE GRAM POSITIVE COCCI   12/08/2019   CULT (A) 01/01/2020    <10,000 COLONIES/mL INSIGNIFICANT GROWTH Performed at [facility], [address] [place], [place] [address]    Imelda Pillow [facility] MORGANII 01/01/2020     Lab Results  Component Value Date   ALBUMIN 1.9 (L) 01/03/2020   ALBUMIN 2.0 (L) 01/02/2020   ALBUMIN 2.6 (L) 01/01/2020    No results found for: MG No results found for: VD25OH  No results found for: PREALBUMIN CBC EXTENDED Latest Ref Rng & Units 01/07/2020 01/06/2020 01/05/2020  WBC 4.0 - 10.5 K/uL 7.8 8.0 6.8  RBC 4.22 - 5.81 MIL/uL 3.05(L) 2.94(L) 2.89(L)  HGB 13.0 - 17.0 g/dL 3.6(U) 8.3(L) 8.2(L)  HCT 39.0 - 52.0 % 29.1(L) 28.2(L) 28.1(L)  PLT 150 - 400 K/uL 370 330 338  NEUTROABS 1.7 - 7.7 K/uL - - -  LYMPHSABS 0.7 - 4.0 K/uL - - -     Body mass index is 23.6 kg/m.  Orders:  No orders of the defined types were placed in this encounter.  No orders of the defined types were placed in this encounter.    Procedures: No procedures performed  Clinical Data: No additional findings.  ROS:  All other systems negative, except as noted in the HPI. Review of Systems  Objective: Vital Signs: Ht 6\' 5"  (1.956 m)   Wt 199  lb (90.3 kg)   BMI 23.60 kg/m   Specialty Comments:  No specialty comments available.  PMFS History: Patient Active Problem List   Diagnosis Date Noted  . Diabetic foot infection (HCC) 01/01/2020  . Maggot infestation 08/23/2019  . Pyogenic inflammation of bone (HCC)   . Osteomyelitis of foot, left, acute (HCC) 12/26/2018  . Right knee pain 10/04/2017  . AKI (acute kidney injury) (HCC) 07/18/2013  . Left hip pain 04/10/2013  . Diabetic peripheral neuropathy associated with type 2 diabetes mellitus (HCC) 11/10/2012  . Marfan's syndrome 08/08/2012  . Healthcare maintenance 08/08/2012  . Atopic dermatitis 02/16/2012  . Overweight (BMI 25.0-29.9) 07/05/2011  . Allergic rhinitis due to allergen 07/03/2011  . Venous insufficiency of leg  04/03/2011  . Blindness 07/23/2010  . DM (diabetes mellitus), type 2 with neurological complications (HCC) 06/13/2010  . Hypertension 06/13/2010  . Hyperlipidemia 06/13/2010   Past Medical History:  Diagnosis Date  . Allergy   . Blindness - both eyes 1967   Basketball injury  . Diabetes mellitus   . Hernia   . Hyperlipidemia   . Hypertension   . Marfan syndrome     Family History  Problem Relation Age of Onset  . Heart disease Mother   . Breast cancer Sister     Past Surgical History:  Procedure Laterality Date  . AMPUTATION Left 12/28/2018   Procedure: LEFT FOOT 3RD RAY AMPUTATION;  Surgeon: Nadara Mustard, MD;  Location: [facility] OR;  Service: Orthopedics;  Laterality: Left;  . AMPUTATION Left 01/03/2020   Procedure: LEFT BELOW KNEE AMPUTATION;  Surgeon: Nadara Mustard, MD;  Location: [facility] OR;  Service: Orthopedics;  Laterality: Left;  . APPENDECTOMY    . INGUINAL HERNIA REPAIR     Right 1970s,    Left 2011 by Dr Andrey Campanile  . INGUINAL HERNIA REPAIR  08/25/10   recurrent left  . PENILE PROSTHESIS IMPLANT  2000s   [facility], Dr Brunilda Payor  . RETINAL DETACHMENT SURGERY  1970  . Rotator Cuff Repair  2009   Left side, Dr Elita Quick   . ROTATOR CUFF REPAIR  2010-2011?   right side   Social History   Occupational History  . 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 Smoking status: Former Smoker    Quit date: 02/14/1984    Years since quitting: 35.9  . Smokeless tobacco: Never Used  Substance and Sexual Activity  . Alcohol use: No    Comment: Quit in 10/1983  . Drug use: No  . Sexual activity: Not on file

## 2020-02-26 ENCOUNTER — Encounter: Payer: Medicare Other | Admitting: Physical Therapy

## 2020-02-27 ENCOUNTER — Ambulatory Visit (INDEPENDENT_AMBULATORY_CARE_PROVIDER_SITE_OTHER): Payer: Medicare Other | Admitting: Physician Assistant

## 2020-02-27 ENCOUNTER — Encounter: Payer: Medicare Other | Admitting: Physical Therapy

## 2020-02-27 ENCOUNTER — Encounter: Payer: Self-pay | Admitting: Physician Assistant

## 2020-02-27 ENCOUNTER — Other Ambulatory Visit: Payer: Self-pay | Admitting: Family Medicine

## 2020-02-27 DIAGNOSIS — Z89512 Acquired absence of left leg below knee: Secondary | ICD-10-CM

## 2020-02-27 NOTE — Progress Notes (Signed)
Office Visit Note   Patient: Chad Hall           Date of Birth: 19-Oct-1950           MRN: [id number] Visit Date: 02/27/2020              Requested by: [name], MD [address] [place],  [place] [address] PCP: [name], MD  No chief complaint on file.     HPI: This is a pleasant gentleman who is almost 2 months status post left below-knee amputation.  He has been molded for his prosthetic and will be meeting with [facility] later this week  Assessment & Plan: Visit Diagnoses: No diagnosis found.  Plan: Patient is doing home physical therapy but will be working also outpatient with Zella Ball.  I have told him not to wear the liner for 8 hours a day and that if need be wear the shrinker beneath the liner.  Follow-up in 4 weeks.  Follow-Up Instructions: No follow-ups on file.   Ortho Exam  Patient is alert, oriented, no adenopathy, well-dressed, normal affect, normal respiratory effort. Well-healed left below-knee amputation stump.  No swelling no cellulitis he has been wearing his liner and there is some dampness secondary to sweating beneath it.  This has caused very mild maceration of the skin but no skin breakdown  Imaging: No results found. No images are attached to the encounter.  Labs: Lab Results  Component Value Date   HGBA1C 5.3 01/01/2020   HGBA1C 5.4 09/26/2019   HGBA1C 7.0 05/04/2019   ESRSEDRATE 108 (H) 12/21/2019   CRP 35.5 (H) 12/21/2019   REPTSTATUS 01/02/2020 FINAL 01/01/2020   GRAMSTAIN NO WBC SEEN RARE GRAM POSITIVE COCCI  12/08/2019   CULT (A) 01/01/2020    <10,000 COLONIES/mL INSIGNIFICANT GROWTH Performed at [facility], [address] [place], [place] [address]    Imelda Pillow [facility] MORGANII 01/01/2020     Lab Results  Component Value Date   ALBUMIN 1.9 (L) 01/03/2020   ALBUMIN 2.0 (L) 01/02/2020   ALBUMIN 2.6 (L) 01/01/2020    No results found for: MG No results found for: VD25OH  No results found for:  PREALBUMIN CBC EXTENDED Latest Ref Rng & Units 01/07/2020 01/06/2020 01/05/2020  WBC 4.0 - 10.5 K/uL 7.8 8.0 6.8  RBC 4.22 - 5.81 MIL/uL 3.05(L) 2.94(L) 2.89(L)  HGB 13.0 - 17.0 g/dL 3.8(H) 8.3(L) 8.2(L)  HCT 39.0 - 52.0 % 29.1(L) 28.2(L) 28.1(L)  PLT 150 - 400 K/uL 370 330 338  NEUTROABS 1.7 - 7.7 K/uL - - -  LYMPHSABS 0.7 - 4.0 K/uL - - -     There is no height or weight on file to calculate BMI.  Orders:  No orders of the defined types were placed in this encounter.  No orders of the defined types were placed in this encounter.    Procedures: No procedures performed  Clinical Data: No additional findings.  ROS:  All other systems negative, except as noted in the HPI. Review of Systems  Objective: Vital Signs: There were no vitals taken for this visit.  Specialty Comments:  No specialty comments available.  PMFS History: Patient Active Problem List   Diagnosis Date Noted  . Diabetic foot infection (HCC) 01/01/2020  . Maggot infestation 08/23/2019  . Pyogenic inflammation of bone (HCC)   . Osteomyelitis of foot, left, acute (HCC) 12/26/2018  . Right knee pain 10/04/2017  . AKI (acute kidney injury) (HCC) 07/18/2013  . Left hip pain  04/10/2013  . Diabetic peripheral neuropathy associated with type 2 diabetes mellitus (HCC) 11/10/2012  . Marfan's syndrome 08/08/2012  . Healthcare maintenance 08/08/2012  . Atopic dermatitis 02/16/2012  . Overweight (BMI 25.0-29.9) 07/05/2011  . Allergic rhinitis due to allergen 07/03/2011  . Venous insufficiency of leg 04/03/2011  . Blindness 07/23/2010  . DM (diabetes mellitus), type 2 with neurological complications (HCC) 06/13/2010  . Hypertension 06/13/2010  . Hyperlipidemia 06/13/2010   Past Medical History:  Diagnosis Date  . Allergy   . Blindness - both eyes 1967   Basketball injury  . Diabetes mellitus   . Hernia   . Hyperlipidemia   . Hypertension   . Marfan syndrome     Family History  Problem Relation  Age of Onset  . Heart disease Mother   . Breast cancer Sister     Past Surgical History:  Procedure Laterality Date  . AMPUTATION Left 12/28/2018   Procedure: LEFT FOOT 3RD RAY AMPUTATION;  Surgeon: Nadara Mustard, MD;  Location: [facility] OR;  Service: Orthopedics;  Laterality: Left;  . AMPUTATION Left 01/03/2020   Procedure: LEFT BELOW KNEE AMPUTATION;  Surgeon: Nadara Mustard, MD;  Location: [facility] OR;  Service: Orthopedics;  Laterality: Left;  . APPENDECTOMY    . INGUINAL HERNIA REPAIR     Right 1970s,    Left 2011 by Dr Andrey Campanile  . INGUINAL HERNIA REPAIR  08/25/10   recurrent left  . PENILE PROSTHESIS IMPLANT  2000s   [facility], Dr Brunilda Payor  . RETINAL DETACHMENT SURGERY  1970  . Rotator Cuff Repair  2009   Left side, Dr Elita Quick   . ROTATOR CUFF REPAIR  2010-2011?   right side   Social History   Occupational History  . 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 Smoking status: Former Smoker    Quit date: 02/14/1984    Years since quitting: 36.0  . Smokeless tobacco: Never Used  Substance and Sexual Activity  . Alcohol use: No    Comment: Quit in 10/1983  . Drug use: No  . Sexual activity: Not on file

## 2020-03-01 ENCOUNTER — Telehealth (HOSPITAL_COMMUNITY): Payer: Self-pay | Admitting: Cardiothoracic Surgery

## 2020-03-01 NOTE — Telephone Encounter (Signed)
I called patient to schedule his echocardiogram that you ordered in Dec. Patient stated in Dec that he had a foot amputated and for me to call him in Jan 2022. I spoke with him today and he still is not reasy to reschedule and states he will call us back when ready to have.  Order will be removed from the ECHO WQ and when he calls back to reschedule we will reinstate the order. Thank you.

## 2020-03-04 ENCOUNTER — Other Ambulatory Visit: Payer: Self-pay | Admitting: Physician Assistant

## 2020-03-04 ENCOUNTER — Telehealth: Payer: Medicare Other | Admitting: Cardiothoracic Surgery

## 2020-03-06 ENCOUNTER — Telehealth: Payer: Self-pay | Admitting: Orthopedic Surgery

## 2020-03-06 NOTE — Telephone Encounter (Signed)
Pt has an appt tomorrow spoke with nurse this morning. Will hold message and address after appt tomorrow.

## 2020-03-06 NOTE — Telephone Encounter (Signed)
Stephanie Clinical Manager called requesting a call back as soon as possible about Dr. Lajoyce Corners pt about shrinker for patient's BKA. Please call Judeth Cornfield back at [phone].

## 2020-03-07 ENCOUNTER — Telehealth: Payer: Self-pay | Admitting: Orthopedic Surgery

## 2020-03-07 ENCOUNTER — Encounter: Payer: Self-pay | Admitting: Orthopedic Surgery

## 2020-03-07 ENCOUNTER — Ambulatory Visit (INDEPENDENT_AMBULATORY_CARE_PROVIDER_SITE_OTHER): Payer: Medicare Other | Admitting: Physician Assistant

## 2020-03-07 DIAGNOSIS — Z89512 Acquired absence of left leg below knee: Secondary | ICD-10-CM

## 2020-03-07 DIAGNOSIS — Z89422 Acquired absence of other left toe(s): Secondary | ICD-10-CM

## 2020-03-07 NOTE — Telephone Encounter (Signed)
Pt was in office and asked to have his PT appt for 03/11/20 canceled.  [phone]

## 2020-03-07 NOTE — Progress Notes (Signed)
Office Visit Note   Patient: Chad Hall           Date of Birth: 08/15/1950           MRN: [id number] Visit Date: 03/07/2020              Requested by: [name], MD [address] [place],  [place] [address] PCP: [name], MD  Chief Complaint  Patient presents with  . Left Leg - Follow-up      HPI: Patient is a pleasant gentleman who follows up on his below-knee amputation he is 2 months since surgery.  His only concern was some serous drainage from the stump in general.  He was originally wearing the liner against the skin but is gone back to wearing a shrinker sock he thinks this is helped  Assessment & Plan: Visit Diagnoses: No diagnosis found.  Plan: Patient will follow-up in 1 month.  Follow-Up Instructions: No follow-ups on file.   Ortho Exam  Patient is alert, oriented, no adenopathy, well-dressed, normal affect, normal respiratory effort. Well-healed amputation incision.  He does have some minimal weeping from sweating and skin changes.  No cellulitis no signs of infection no foul odor.  Patient was seen today by Dr. Lajoyce Corners  Imaging: No results found. No images are attached to the encounter.  Labs: Lab Results  Component Value Date   HGBA1C 5.3 01/01/2020   HGBA1C 5.4 09/26/2019   HGBA1C 7.0 05/04/2019   ESRSEDRATE 108 (H) 12/21/2019   CRP 35.5 (H) 12/21/2019   REPTSTATUS 01/02/2020 FINAL 01/01/2020   GRAMSTAIN NO WBC SEEN RARE GRAM POSITIVE COCCI  12/08/2019   CULT (A) 01/01/2020    <10,000 COLONIES/mL INSIGNIFICANT GROWTH Performed at [facility] Lab, [address], [place], [place] [address]    Imelda Pillow [facility] MORGANII 01/01/2020     Lab Results  Component Value Date   ALBUMIN 1.9 (L) 01/03/2020   ALBUMIN 2.0 (L) 01/02/2020   ALBUMIN 2.6 (L) 01/01/2020    No results found for: MG No results found for: VD25OH  No results found for: PREALBUMIN CBC EXTENDED Latest Ref Rng & Units 01/07/2020 01/06/2020 01/05/2020  WBC 4.0  - 10.5 K/uL 7.8 8.0 6.8  RBC 4.22 - 5.81 MIL/uL 3.05(L) 2.94(L) 2.89(L)  HGB 13.0 - 17.0 g/dL 7.3(S) 8.3(L) 8.2(L)  HCT 39.0 - 52.0 % 29.1(L) 28.2(L) 28.1(L)  PLT 150 - 400 K/uL 370 330 338  NEUTROABS 1.7 - 7.7 K/uL - - -  LYMPHSABS 0.7 - 4.0 K/uL - - -     There is no height or weight on file to calculate BMI.  Orders:  No orders of the defined types were placed in this encounter.  No orders of the defined types were placed in this encounter.    Procedures: No procedures performed  Clinical Data: No additional findings.  ROS:  All other systems negative, except as noted in the HPI. Review of Systems  Objective: Vital Signs: There were no vitals taken for this visit.  Specialty Comments:  No specialty comments available.  PMFS History: Patient Active Problem List   Diagnosis Date Noted  . Diabetic foot infection (HCC) 01/01/2020  . Maggot infestation 08/23/2019  . Pyogenic inflammation of bone (HCC)   . Osteomyelitis of foot, left, acute (HCC) 12/26/2018  . Right knee pain 10/04/2017  . AKI (acute kidney injury) (HCC) 07/18/2013  . Left hip pain 04/10/2013  . Diabetic peripheral neuropathy associated with type 2 diabetes mellitus (HCC) 11/10/2012  .  Marfan's syndrome 08/08/2012  . Healthcare maintenance 08/08/2012  . Atopic dermatitis 02/16/2012  . Overweight (BMI 25.0-29.9) 07/05/2011  . Allergic rhinitis due to allergen 07/03/2011  . Venous insufficiency of leg 04/03/2011  . Blindness 07/23/2010  . DM (diabetes mellitus), type 2 with neurological complications (HCC) 06/13/2010  . Hypertension 06/13/2010  . Hyperlipidemia 06/13/2010   Past Medical History:  Diagnosis Date  . Allergy   . Blindness - both eyes 1967   Basketball injury  . Diabetes mellitus   . Hernia   . Hyperlipidemia   . Hypertension   . Marfan syndrome     Family History  Problem Relation Age of Onset  . Heart disease Mother   . Breast cancer Sister     Past Surgical History:   Procedure Laterality Date  . AMPUTATION Left 12/28/2018   Procedure: LEFT FOOT 3RD RAY AMPUTATION;  Surgeon: Nadara Mustard, MD;  Location: [facility] OR;  Service: Orthopedics;  Laterality: Left;  . AMPUTATION Left 01/03/2020   Procedure: LEFT BELOW KNEE AMPUTATION;  Surgeon: Nadara Mustard, MD;  Location: [facility] OR;  Service: Orthopedics;  Laterality: Left;  . APPENDECTOMY    . INGUINAL HERNIA REPAIR     Right 1970s,    Left 2011 by Dr Andrey Campanile  . INGUINAL HERNIA REPAIR  08/25/10   recurrent left  . PENILE PROSTHESIS IMPLANT  2000s   [facility], Dr Brunilda Payor  . RETINAL DETACHMENT SURGERY  1970  . Rotator Cuff Repair  2009   Left side, Dr Elita Quick   . ROTATOR CUFF REPAIR  2010-2011?   right side   Social History   Occupational History  . 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 Smoking status: Former Smoker    Quit date: 02/14/1984    Years since quitting: 36.0  . Smokeless tobacco: Never Used  Substance and Sexual Activity  . Alcohol use: No    Comment: Quit in 10/1983  . Drug use: No  . Sexual activity: Not on file

## 2020-03-08 NOTE — Telephone Encounter (Signed)
I called and sw Chad Hall to advise that the pt was evaluated in the office yesterday and that he had been advised to go back to wear the shrinker he had before has an appt for follow up on 03/26/20 and will call if there are any changes and HHN will continue to monitor.

## 2020-03-11 ENCOUNTER — Encounter: Payer: Medicare Other | Admitting: Physical Therapy

## 2020-03-18 ENCOUNTER — Telehealth: Payer: Medicare Other | Admitting: Cardiothoracic Surgery

## 2020-03-18 ENCOUNTER — Other Ambulatory Visit: Payer: Self-pay | Admitting: *Deleted

## 2020-03-18 ENCOUNTER — Other Ambulatory Visit: Payer: Self-pay

## 2020-03-18 DIAGNOSIS — I712 Thoracic aortic aneurysm, without rupture, unspecified: Secondary | ICD-10-CM

## 2020-03-23 ENCOUNTER — Other Ambulatory Visit: Payer: Self-pay | Admitting: Physician Assistant

## 2020-03-26 ENCOUNTER — Other Ambulatory Visit: Payer: Self-pay | Admitting: Family Medicine

## 2020-03-26 ENCOUNTER — Ambulatory Visit: Payer: Medicare Other | Admitting: Physician Assistant

## 2020-03-28 ENCOUNTER — Ambulatory Visit (INDEPENDENT_AMBULATORY_CARE_PROVIDER_SITE_OTHER): Payer: Medicare Other | Admitting: Physical Therapy

## 2020-03-28 ENCOUNTER — Encounter: Payer: Self-pay | Admitting: Physical Therapy

## 2020-03-28 ENCOUNTER — Other Ambulatory Visit: Payer: Self-pay

## 2020-03-28 DIAGNOSIS — M6281 Muscle weakness (generalized): Secondary | ICD-10-CM | POA: Diagnosis not present

## 2020-03-28 DIAGNOSIS — R2681 Unsteadiness on feet: Secondary | ICD-10-CM

## 2020-03-28 DIAGNOSIS — R293 Abnormal posture: Secondary | ICD-10-CM | POA: Diagnosis not present

## 2020-03-28 DIAGNOSIS — R2689 Other abnormalities of gait and mobility: Secondary | ICD-10-CM

## 2020-03-28 NOTE — Therapy (Signed)
[facility] Physical Therapy [address] [place], [place], [address] Phone: [phone]   Fax:  [phone]  Physical Therapy Treatment  Patient Details  Name: Chad Hall MRN: [id number] Date of Birth: 1951-02-11 Referring Provider (PT): West Bali Persons, [place]   Encounter Date: 03/28/2020   PT End of Session - 03/28/20 1046    Visit Number 1    Number of Visits 25    Date for PT Re-Evaluation 06/26/20    Authorization Type [employer] Medicare    Authorization Time Period 5% coinsurance    PT Start Time 0845    PT Stop Time 0930    PT Time Calculation (min) 45 min    Equipment Utilized During Treatment Gait belt    Activity Tolerance Patient tolerated treatment well    Behavior During Therapy [facility] for tasks assessed/performed           Past Medical History:  Diagnosis Date  . Allergy   . Blindness - both eyes 1967   Basketball injury  . Diabetes mellitus   . Hernia   . Hyperlipidemia   . Hypertension   . Marfan syndrome     Past Surgical History:  Procedure Laterality Date  . AMPUTATION Left 12/28/2018   Procedure: LEFT FOOT 3RD RAY AMPUTATION;  Surgeon: Nadara Mustard, MD;  Location: [facility] OR;  Service: Orthopedics;  Laterality: Left;  . AMPUTATION Left 01/03/2020   Procedure: LEFT BELOW KNEE AMPUTATION;  Surgeon: Nadara Mustard, MD;  Location: [facility] OR;  Service: Orthopedics;  Laterality: Left;  . APPENDECTOMY    . INGUINAL HERNIA REPAIR     Right 1970s,    Left 2011 by Dr Andrey Campanile  . INGUINAL HERNIA REPAIR  08/25/10   recurrent left  . PENILE PROSTHESIS IMPLANT  2000s   [facility], Dr Brunilda Payor  . RETINAL DETACHMENT SURGERY  1970  . Rotator Cuff Repair  2009   Left side, Dr Elita Quick   . ROTATOR CUFF REPAIR  2010-2011?   right side    There were no vitals filed for this visit.   Subjective Assessment - 03/28/20 0853    Subjective This 70yo male was referred to PT by West Bali Persons, PA with [phone] (ICD-10-CM) - History of left below knee  amputation. He underwent a left Transtibial Amputation on 01/03/2020 due to osteomyelitis. He received prosthesis on 03/22/2020 from [facility] working with [address], [facility].    Pertinent History Lt TTA, DM2, peripheral neuropathy, HTN, Marfan's syndrome blindness 1967    Patient Stated Goals walk with prosthesis like before which was no walking device & used service animal in community    Currently in Pain? No/denies              [facility] PT Assessment - 03/28/20 0845      Assessment   Medical Diagnosis Left Transtibial Amputation    Referring Provider (PT) West Bali Persons, PA    Onset Date/Surgical Date 03/22/20   prosthesis delivery   Hand Dominance Left    Prior Therapy [facility]      Precautions   Precautions Fall      Balance Screen   Has the patient fallen in the past 6 months No    Has the patient had a decrease in activity level because of a fear of falling?  Yes   since TTA   Is the patient reluctant to leave their home because of a fear of falling?  No   uses w/c  Home Environment   Living Environment Private residence    Living Arrangements Spouse/significant other   wife is blind   Type of Home House    Home Access Ramped entrance   back entrance has 3 steps right rail   Home Layout One level    Home Equipment Walker - standard;Bedside commode;Shower seat;Wheelchair - manual   visually impaired cane     Prior Function   Level of Independence Independent;Independent with household mobility without device;Independent with community mobility without device   used service dog for community   Vocation Retired    Leisure walk outdoors      Transfers   Transfers Sit to [employer];Stand to [employer]    Sit to Stand 5: Supervision;With upper extremity assist;With armrests;From chair/3-in-1;Other (comment)   requires RW or external support to stabilize   Stand to Sit 5: Supervision;With upper extremity assist;With armrests;To chair/3-in-1;Other (comment)   requires RW or external  support for stability     Ambulation/Gait   Ambulation/Gait Yes    Ambulation/Gait Assistance 4: Min guard;4: Min assist   Min guard //bars & MinA RW   Ambulation/Gait Assistance Details excessive UE weight bearing and partial weight on prosthesis    Ambulation Distance (Feet) 40 Feet   40' in //bars & 40' w/RW   Assistive device Prosthesis;Parallel bars;Rolling walker    Gait Pattern Step-to pattern;Decreased step length - right;Decreased stance time - left;Decreased hip/knee flexion - left;Decreased weight shift to left;Left hip hike;Lateral hip instability;Trunk flexed;Poor foot clearance - right   LLE adducted   Ambulation Surface Level;Indoor      Standardized Balance Assessment   Standardized Balance Assessment Berg Balance Test      Berg Balance Test   Sit to Stand Needs minimal aid to stand or to stabilize    Standing Unsupported Needs several tries to stand 30 seconds unsupported    Sitting with Back Unsupported but Feet Supported on Floor or Stool Able to sit safely and securely 2 minutes    Stand to Sit Controls descent by using hands    Transfers Able to transfer safely, definite need of hands    Standing Unsupported with Eyes Closed Able to stand 10 seconds with supervision    Standing Unsupported with Feet Together Needs help to attain position but able to stand for 30 seconds with feet together    From Standing, Reach Forward with Outstretched Arm Loses balance while trying/requires external support    From Standing Position, Pick up Object from Floor Unable to try/needs assist to keep balance    From Standing Position, Turn to Look Behind Over each Shoulder Needs assist to keep from losing balance and falling    Turn 360 Degrees Needs assistance while turning    Standing Unsupported, Alternately Place Feet on Step/Stool Needs assistance to keep from falling or unable to try    Standing Unsupported, One Foot in Front Loses balance while stepping or standing    Standing on  One Leg Unable to try or needs assist to prevent fall    Total Score 16           Prosthetics Assessment - 03/28/20 0845      Prosthetics   Prosthetic Care Dependent with Skin check;Residual limb care;Prosthetic cleaning;Ply sock cleaning;Correct ply sock adjustment;Proper wear schedule/adjustment;Proper weight-bearing schedule/adjustment    Donning prosthesis  Supervision    Doffing prosthesis  Modified independent (Device/Increase time)    Current prosthetic wear tolerance (days/week)  daily for 6 days since delivery  Current prosthetic wear tolerance (#hours/day)  up to 2.5 hrs total between 2 wears    Current prosthetic weight-bearing tolerance (hours/day)  Patient reported mild limb discomfort with standing & gait activities    Edema pitting edema with 5 sec capillary refill    Residual limb condition  scab wound on anterior tibia area, severe scally skin entire limb distal to knee, over moist with gray cloudy skin (shrinker under liner was damp with perspiration), normal temperature, cylinderical shape    Prosthesis Description silicon liner with shuttle pin lock, total contact socket, Flex foot    K code/activity level with prosthetic use  K3 full community with variable cadence                        [facility] Adult PT Treatment/Exercise - 03/28/20 0845      Prosthetics   Prosthetic Care Comments  PT instructed in proper donning of liner with patient feeling what PT recommended.  PT recommended starting wear with 1 hr on & 2 hrs off 3-4 times during day.  He needs to feel shrinker & switch if damp from perspiration.    Education Provided Proper Donning;Correct ply sock adjustment;Proper wear schedule/adjustment;Other (comment)   see prosthetic care comments   Person(s) Educated Patient    Education Method Explanation;Tactile cues;Verbal cues    Education Method Verbalized understanding;Tactile cues required;Verbal cues required;Needs further instruction                     PT Short Term Goals - 03/28/20 1222      PT SHORT TERM GOAL #1   Title Patient donnes prosthesis correctly modified independent.    Time 4    Period Weeks    Status New    Target Date 04/27/20      PT SHORT TERM GOAL #2   Title Patient tolerates prosthesis wear >8 hrs total / day without increase in skin issues.    Time 4    Period Weeks    Status New    Target Date 04/27/20      PT SHORT TERM GOAL #3   Title Patient transfers sit to/from stand chair without armrest using UEs to push off chair but no external support required to stabilize independently.    Time 4    Period Weeks    Status New    Target Date 04/27/20      PT SHORT TERM GOAL #4   Title Patient ambulates 200' with walker & prosthesis with supervision.    Time 4    Period Weeks    Status New    Target Date 04/27/20             PT Long Term Goals - 03/28/20 1218      PT LONG TERM GOAL #1   Title Patient verbalizes & demonstrates understanding of proper prosthetic care to enable utilization of proshesis.    Time 12    Period Weeks    Status New    Target Date 06/26/20      PT LONG TERM GOAL #2   Title Patient tolerates prosthesis wear >90% of awake hours without skin or limb discomfort issues to enable function throughout his day.    Time 12    Period Weeks    Status New    Target Date 06/26/20      PT LONG TERM GOAL #3   Title Berg Balance >/= 45/56    Time 12  Period Weeks    Status New    Target Date 06/26/20      PT LONG TERM GOAL #4   Title Patient ambulates >500' with cane or less and seeing eye dog with prosthesis modified independent.    Time 12    Period Weeks    Status New    Target Date 06/26/20      PT LONG TERM GOAL #5   Title Patient negotiates ramps & curbs with cane or less and seeing eye dog with prosthesis modified independent.    Time 12    Period Weeks    Status New    Target Date 06/26/20                 Plan - 03/28/20 1053     Clinical Impression Statement This 70yo male with history of blindness since 1967 underwent a left Transtibial Amputation on 01/03/2020 and received his first prosthesis on 03/22/2020 (6 days prior to PT evaluation).  He has significant fraility of skin with high risk for breakdown and his wife & he are blind limiting ability to check limb.  He has worn prosthesis daily for up to 2.5 hours which is only small percentage of his awake hours which limits function.  He is dependent in proper prosthetic care. Berg Balance score of 16/56 indicates high fall risk and depenendency in standing ADLs.  His gait requires bilateral upper extremity support with limited weight bearing on prosthesis and gait deviations indicating high fall risk.    Personal Factors and Comorbidities Comorbidity 3+    Comorbidities Lt TTA, DM2, peripheral neuropathy, HTN, Marfan's syndrome blindness 1967    Examination-Activity Limitations Lift;Locomotion Level;Stairs;Stand;Transfers    Stability/Clinical Decision Making Evolving/Moderate complexity    Clinical Decision Making Moderate    Rehab Potential Good    PT Frequency 2x / week    PT Duration 12 weeks    PT Treatment/Interventions ADLs/Self Care Home Management;DME Instruction;Gait training;Stair training;Functional mobility training;Therapeutic activities;Therapeutic exercise;Balance training;Neuromuscular re-education;Patient/family education;Prosthetic Training    PT Next Visit Plan review prosthetic care, instruct in HEP at sink, assess gait with RW vs std walker (already has at home)    Consulted and Agree with Plan of Care Patient           Patient will benefit from skilled therapeutic intervention in order to improve the following deficits and impairments:  Abnormal gait,Decreased activity tolerance,Decreased balance,Decreased endurance,Decreased knowledge of use of DME,Decreased mobility,Decreased skin integrity,Decreased strength,Increased edema,Postural  dysfunction,Prosthetic Dependency  Visit Diagnosis: Unsteadiness on feet  Other abnormalities of gait and mobility  Muscle weakness (generalized)  Abnormal posture     Problem List Patient Active Problem List   Diagnosis Date Noted  . Diabetic foot infection (HCC) 01/01/2020  . Maggot infestation 08/23/2019  . Pyogenic inflammation of bone (HCC)   . Osteomyelitis of foot, left, acute (HCC) 12/26/2018  . Right knee pain 10/04/2017  . AKI (acute kidney injury) (HCC) 07/18/2013  . Left hip pain 04/10/2013  . Diabetic peripheral neuropathy associated with type 2 diabetes mellitus (HCC) 11/10/2012  . Marfan's syndrome 08/08/2012  . Healthcare maintenance 08/08/2012  . Atopic dermatitis 02/16/2012  . Overweight (BMI 25.0-29.9) 07/05/2011  . Allergic rhinitis due to allergen 07/03/2011  . Venous insufficiency of leg 04/03/2011  . Blindness 07/23/2010  . DM (diabetes mellitus), type 2 with neurological complications (HCC) 06/13/2010  . Hypertension 06/13/2010  . Hyperlipidemia 06/13/2010    Vladimir Faster  PT, DPT 03/28/2020, 12:31 PM  [facility]  Physical Therapy [address] [place], [place], [address] Phone: [phone]   Fax:  [phone]  Name: KOREY PRASHAD MRN: [id number] Date of Birth: 1950-12-24

## 2020-03-29 ENCOUNTER — Telehealth: Payer: Self-pay | Admitting: Family Medicine

## 2020-03-29 NOTE — Telephone Encounter (Signed)
Patient called waiting to do a virtual appointment. He said the reason he is wanting a virtual is because he had his left leg amputated. Please let me know if you want to do that or what I need to tell him or if you call him and talk to him.  Thanks

## 2020-04-01 ENCOUNTER — Encounter: Payer: Self-pay | Admitting: Orthopedic Surgery

## 2020-04-01 ENCOUNTER — Ambulatory Visit (INDEPENDENT_AMBULATORY_CARE_PROVIDER_SITE_OTHER): Payer: Medicare Other | Admitting: Physician Assistant

## 2020-04-01 DIAGNOSIS — Z89422 Acquired absence of other left toe(s): Secondary | ICD-10-CM

## 2020-04-01 NOTE — Progress Notes (Signed)
Office Visit Note   Patient: Chad Hall           Date of Birth: December 27, 1950           MRN: [id number] Visit Date: 04/01/2020              Requested by: [name], MD [address] [place],  [place] [address] PCP: [name], MD  Chief Complaint  Patient presents with  . Left Leg - Routine Post Op    01/03/20 left BKA       HPI: Patient is 3 months status post left below-knee amputation.  He is doing quite well he is wearing his prosthetic and is already had his additional physical therapy assessment.  Assessment & Plan: Visit Diagnoses: No diagnosis found.  Plan: Follow-up for final visit in 2 months.  Emphasized the importance of following up and having someone check his other foot periodically looks fine today.  But given his vision impairment and his wife's vision impairment is important to continue to keep an eye on his other foot  Follow-Up Instructions: No follow-ups on file.   Ortho Exam  Patient is alert, oriented, no adenopathy, well-dressed, normal affect, normal respiratory effort. Left below-knee amputation stump is completely healed.  There is no swelling no cellulitis he does have some flaking and drying of the skin.  No concerns for infection.  Right foot is without any ulcerative changes  Imaging: No results found. No images are attached to the encounter.  Labs: Lab Results  Component Value Date   HGBA1C 5.3 01/01/2020   HGBA1C 5.4 09/26/2019   HGBA1C 7.0 05/04/2019   ESRSEDRATE 108 (H) 12/21/2019   CRP 35.5 (H) 12/21/2019   REPTSTATUS 01/02/2020 FINAL 01/01/2020   GRAMSTAIN NO WBC SEEN RARE GRAM POSITIVE COCCI  12/08/2019   CULT (A) 01/01/2020    <10,000 COLONIES/mL INSIGNIFICANT GROWTH Performed at [facility], [address] [place], [place] [address]    Imelda Pillow [facility] MORGANII 01/01/2020     Lab Results  Component Value Date   ALBUMIN 1.9 (L) 01/03/2020   ALBUMIN 2.0 (L) 01/02/2020   ALBUMIN 2.6 (L) 01/01/2020     No results found for: MG No results found for: VD25OH  No results found for: PREALBUMIN CBC EXTENDED Latest Ref Rng & Units 01/07/2020 01/06/2020 01/05/2020  WBC 4.0 - 10.5 K/uL 7.8 8.0 6.8  RBC 4.22 - 5.81 MIL/uL 3.05(L) 2.94(L) 2.89(L)  HGB 13.0 - 17.0 g/dL 9.7(Q) 8.3(L) 8.2(L)  HCT 39.0 - 52.0 % 29.1(L) 28.2(L) 28.1(L)  PLT 150 - 400 K/uL 370 330 338  NEUTROABS 1.7 - 7.7 K/uL - - -  LYMPHSABS 0.7 - 4.0 K/uL - - -     There is no height or weight on file to calculate BMI.  Orders:  No orders of the defined types were placed in this encounter.  No orders of the defined types were placed in this encounter.    Procedures: No procedures performed  Clinical Data: No additional findings.  ROS:  All other systems negative, except as noted in the HPI. Review of Systems  Objective: Vital Signs: There were no vitals taken for this visit.  Specialty Comments:  No specialty comments available.  PMFS History: Patient Active Problem List   Diagnosis Date Noted  . Diabetic foot infection (HCC) 01/01/2020  . Maggot infestation 08/23/2019  . Pyogenic inflammation of bone (HCC)   . Osteomyelitis of foot, left, acute (HCC) 12/26/2018  . Right knee pain  10/04/2017  . AKI (acute kidney injury) (HCC) 07/18/2013  . Left hip pain 04/10/2013  . Diabetic peripheral neuropathy associated with type 2 diabetes mellitus (HCC) 11/10/2012  . Marfan's syndrome 08/08/2012  . Healthcare maintenance 08/08/2012  . Atopic dermatitis 02/16/2012  . Overweight (BMI 25.0-29.9) 07/05/2011  . Allergic rhinitis due to allergen 07/03/2011  . Venous insufficiency of leg 04/03/2011  . Blindness 07/23/2010  . DM (diabetes mellitus), type 2 with neurological complications (HCC) 06/13/2010  . Hypertension 06/13/2010  . Hyperlipidemia 06/13/2010   Past Medical History:  Diagnosis Date  . Allergy   . Blindness - both eyes 1967   Basketball injury  . Diabetes mellitus   . Hernia   .  Hyperlipidemia   . Hypertension   . Marfan syndrome     Family History  Problem Relation Age of Onset  . Heart disease Mother   . Breast cancer Sister     Past Surgical History:  Procedure Laterality Date  . AMPUTATION Left 12/28/2018   Procedure: LEFT FOOT 3RD RAY AMPUTATION;  Surgeon: Nadara Mustard, MD;  Location: [facility] OR;  Service: Orthopedics;  Laterality: Left;  . AMPUTATION Left 01/03/2020   Procedure: LEFT BELOW KNEE AMPUTATION;  Surgeon: Nadara Mustard, MD;  Location: [facility] OR;  Service: Orthopedics;  Laterality: Left;  . APPENDECTOMY    . INGUINAL HERNIA REPAIR     Right 1970s,    Left 2011 by Dr Andrey Campanile  . INGUINAL HERNIA REPAIR  08/25/10   recurrent left  . PENILE PROSTHESIS IMPLANT  2000s   [facility], Dr Brunilda Payor  . RETINAL DETACHMENT SURGERY  1970  . Rotator Cuff Repair  2009   Left side, Dr Elita Quick   . ROTATOR CUFF REPAIR  2010-2011?   right side   Social History   Occupational History  . 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 Smoking status: Former Smoker    Quit date: 02/14/1984    Years since quitting: 36.1  . Smokeless tobacco: Never Used  Substance and Sexual Activity  . Alcohol use: No    Comment: Quit in 10/1983  . Drug use: No  . Sexual activity: Not on file

## 2020-04-04 ENCOUNTER — Other Ambulatory Visit: Payer: Self-pay

## 2020-04-04 ENCOUNTER — Ambulatory Visit (INDEPENDENT_AMBULATORY_CARE_PROVIDER_SITE_OTHER): Payer: Medicare Other | Admitting: Rehabilitation

## 2020-04-04 ENCOUNTER — Encounter: Payer: Self-pay | Admitting: Rehabilitation

## 2020-04-04 DIAGNOSIS — R2689 Other abnormalities of gait and mobility: Secondary | ICD-10-CM | POA: Diagnosis not present

## 2020-04-04 DIAGNOSIS — M6281 Muscle weakness (generalized): Secondary | ICD-10-CM | POA: Diagnosis not present

## 2020-04-04 DIAGNOSIS — R293 Abnormal posture: Secondary | ICD-10-CM | POA: Diagnosis not present

## 2020-04-04 DIAGNOSIS — R2681 Unsteadiness on feet: Secondary | ICD-10-CM | POA: Diagnosis not present

## 2020-04-04 NOTE — Therapy (Signed)
[facility] Physical Therapy [address] [place], [place], [address] Phone: [phone]   Fax:  [phone]  Physical Therapy Treatment  Patient Details  Name: Chad Hall MRN: [id number] Date of Birth: [birth year] Referring Provider (PT): [place]ali Persons, [place]   Encounter Date: 04/04/2020   PT End of Session - 04/04/20 1134    Visit Number 2    Number of Visits 25    Date for PT Re-Evaluation 06/26/20    Authorization Type UHC Medicare    Authorization Time Period 5% coinsurance    PT Start Time 1000    PT Stop Time 1049    PT Time Calculation (min) 49 min    Equipment Utilized During Treatment Gait belt    Activity Tolerance Patient tolerated treatment well    Behavior During Therapy [facility] for tasks assessed/performed           Past Medical History:  Diagnosis Date  . Allergy   . Blindness - both eyes 1967   Basketball injury  . Diabetes mellitus   . Hernia   . Hyperlipidemia   . Hypertension   . Marfan syndrome     Past Surgical History:  Procedure Laterality Date  . AMPUTATION Left 12/28/2018   Procedure: LEFT FOOT 3RD RAY AMPUTATION;  Surgeon: Nadara Mustard, MD;  Location: [facility] OR;  Service: Orthopedics;  Laterality: Left;  . AMPUTATION Left 01/03/2020   Procedure: LEFT BELOW KNEE AMPUTATION;  Surgeon: Nadara Mustard, MD;  Location: [facility] OR;  Service: Orthopedics;  Laterality: Left;  . APPENDECTOMY    . INGUINAL HERNIA REPAIR     Right 1970s,    Left 2011 by Dr Andrey Campanile  . INGUINAL HERNIA REPAIR  08/25/10   recurrent left  . PENILE PROSTHESIS IMPLANT  2000s   [facility], Dr Brunilda Payor  . RETINAL DETACHMENT SURGERY  1970  . Rotator Cuff Repair  2009   Left side, Dr Elita Quick   . ROTATOR CUFF REPAIR  2010-2011?   right side    There were no vitals filed for this visit.   Subjective Assessment - 04/04/20 1110    Subjective Pt doing well at home and has been "self progressing" with increased wear time (only 2 hrs at a time and one hour  off), and also doing some standing and walking at home. He did get RW, so has been using this.    Pertinent History Lt TTA, DM2, peripheral neuropathy, HTN, Marfan's syndrome blindness 1967    Patient Stated Goals walk with prosthesis like before which was no walking device & used service animal in community                             [facility] Adult PT Treatment/Exercise - 04/04/20 1111      Transfers   Transfers Sit to Stand;Stand to Sit    Sit to Stand 5: Supervision;With upper extremity assist;With armrests;From chair/3-in-1;Other (comment)    Sit to Stand Details Verbal cues for sequencing;Verbal cues for technique    Sit to Stand Details (indicate cue type and reason) Cues for ensuring prosthesis is under him when sitting/standing.    Stand to Sit 5: Supervision;With upper extremity assist;With armrests;To chair/3-in-1;Other (comment)    Transfer Cueing Pt requires guidance and verbal cues from PT for turning.  Max cues to remain inside RW when turning which is difficult due to vision loss.      Ambulation/Gait   Ambulation/Gait Yes  Ambulation/Gait Assistance 4: Min guard;4: Min assist    Ambulation/Gait Assistance Details Had pt ambulate with RW following skin assessment and adjustment of socks from 5 ply to 6 ply.  Note socket seems rotated, therefore had him reassess pin alignment which was slightly off, sat and readjusted with heavy cues for ensuring inverting liner completely and holding pin between thumbs to ensure proper alignment.  Once this was fixed, he ambulated around therapy gym with min/guard to min a with cues for guidance and ensuring upright posture, wider BOS with less R LE adduction and ensuring more neutral alignment of L foot.  He then requested to ambulate without device with PT "leading".  Did have pts nephew accompany for safety.  He ambulated another 20' in this manner.  Note gait speed is improved and alignment is better however do  not feel from a  safety nor WB point and discussed this with patient in length.  Pt verbalized understanding.    Ambulation Distance (Feet) 140 Feet    Assistive device Rolling walker;Prosthesis;Other (Comment)   No AD   Gait Pattern Step-to pattern;Decreased step length - right;Decreased stance time - left;Decreased hip/knee flexion - left;Decreased weight shift to left;Left hip hike;Lateral hip instability;Trunk flexed;Poor foot clearance - right    Ambulation Surface Level;Indoor      Neuro Re-ed    Neuro Re-ed Details  Reviewed sink HEP at // bars with cues to feel sink middle and align body as well as ensuring his R foot is under R shoulder, and L foot is under L shoulder.  See pt instructions for details (did not provide for pt due to being blind).      Prosthetics   Prosthetic Care Comments  Provided education on lotion as pt donned lotion this morning prior to donning liner.  Educated on proper pin alignment, sock ply adjustment and letting Aundra Millet know that he is wearing high number of socks already (will likely not add pads until he is a little further out).  Educated on sink HEP and had him verbalize back.   Also continued to educate on importance of building up wear and WB time gradually to avoid skin breakdown.    Current prosthetic wear tolerance (days/week)  daily    Current prosthetic wear tolerance (#hours/day)  2 hours on, 1 hour off throughout the day.  He is removing to pat limb dry.    Current prosthetic weight-bearing tolerance (hours/day)  Pt reported that he is standing for several mins at a time.    Edema no overt edema noted today    Residual limb condition  skin seems to be in much better condition, scab is no longer present and healed, skin is not dry (again he had applied lotion this morning) and skin color was good    Education Provided Proper Donning;Correct ply sock adjustment;Proper wear schedule/adjustment;Other (comment);Proper weight-bearing schedule/adjustment    Person(s) Educated  Patient    Education Method Explanation;Verbal cues    Education Method Verbalized understanding;Needs further instruction    Donning Prosthesis Supervision;Minimal assist                    PT Short Term Goals - 03/28/20 1222      PT SHORT TERM GOAL #1   Title Patient donnes prosthesis correctly modified independent.    Time 4    Period Weeks    Status New    Target Date 04/27/20      PT SHORT TERM GOAL #2  Title Patient tolerates prosthesis wear >8 hrs total / day without increase in skin issues.    Time 4    Period Weeks    Status New    Target Date 04/27/20      PT SHORT TERM GOAL #3   Title Patient transfers sit to/from stand chair without armrest using UEs to push off chair but no external support required to stabilize independently.    Time 4    Period Weeks    Status New    Target Date 04/27/20      PT SHORT TERM GOAL #4   Title Patient ambulates 200' with walker & prosthesis with supervision.    Time 4    Period Weeks    Status New    Target Date 04/27/20             PT Long Term Goals - 03/28/20 1218      PT LONG TERM GOAL #1   Title Patient verbalizes & demonstrates understanding of proper prosthetic care to enable utilization of proshesis.    Time 12    Period Weeks    Status New    Target Date 06/26/20      PT LONG TERM GOAL #2   Title Patient tolerates prosthesis wear >90% of awake hours without skin or limb discomfort issues to enable function throughout his day.    Time 12    Period Weeks    Status New    Target Date 06/26/20      PT LONG TERM GOAL #3   Title Berg Balance >/= 45/56    Time 12    Period Weeks    Status New    Target Date 06/26/20      PT LONG TERM GOAL #4   Title Patient ambulates >500' with cane or less and seeing eye dog with prosthesis modified independent.    Time 12    Period Weeks    Status New    Target Date 06/26/20      PT LONG TERM GOAL #5   Title Patient negotiates ramps & curbs with cane or  less and seeing eye dog with prosthesis modified independent.    Time 12    Period Weeks    Status New    Target Date 06/26/20                 Plan - 04/04/20 1134    Clinical Impression Statement Skilled session focused on prosthetic education as well as gait training with RW and without device (per pts request).  Discussed why it is not recommended at this time that he ambulate without device.  Pt verbalized understanding.  Pt also educated and performed sink HEP at // bars today.  Pt able to return demo with min cues.  May need to review at next session to ensure proper technique.    Personal Factors and Comorbidities Comorbidity 3+    Comorbidities Lt TTA, DM2, peripheral neuropathy, HTN, Marfan's syndrome blindness 1967    Examination-Activity Limitations Lift;Locomotion Level;Stairs;Stand;Transfers    Stability/Clinical Decision Making Evolving/Moderate complexity    Rehab Potential Good    PT Frequency 2x / week    PT Duration 12 weeks    PT Treatment/Interventions ADLs/Self Care Home Management;DME Instruction;Gait training;Stair training;Functional mobility training;Therapeutic activities;Therapeutic exercise;Balance training;Neuromuscular re-education;Patient/family education;Prosthetic Training    PT Next Visit Plan review prosthetic care, review sink HEP, check skin and go over wear time.  He is very anxious to ambulate without device  and honestly he looks pretty good, but he probably looks better with RW in his home vs here in clinic as I have to guide him.    Consulted and Agree with Plan of Care Patient           Patient will benefit from skilled therapeutic intervention in order to improve the following deficits and impairments:  Abnormal gait,Decreased activity tolerance,Decreased balance,Decreased endurance,Decreased knowledge of use of DME,Decreased mobility,Decreased skin integrity,Decreased strength,Increased edema,Postural dysfunction,Prosthetic  Dependency  Visit Diagnosis: Unsteadiness on feet  Other abnormalities of gait and mobility  Muscle weakness (generalized)  Abnormal posture     Problem List Patient Active Problem List   Diagnosis Date Noted  . Diabetic foot infection (HCC) 01/01/2020  . Maggot infestation 08/23/2019  . Pyogenic inflammation of bone (HCC)   . Osteomyelitis of foot, left, acute (HCC) 12/26/2018  . Right knee pain 10/04/2017  . AKI (acute kidney injury) (HCC) 07/18/2013  . Left hip pain 04/10/2013  . Diabetic peripheral neuropathy associated with type 2 diabetes mellitus (HCC) 11/10/2012  . Marfan's syndrome 08/08/2012  . Healthcare maintenance 08/08/2012  . Atopic dermatitis 02/16/2012  . Overweight (BMI 25.0-29.9) 07/05/2011  . Allergic rhinitis due to allergen 07/03/2011  . Venous insufficiency of leg 04/03/2011  . Blindness 07/23/2010  . DM (diabetes mellitus), type 2 with neurological complications (HCC) 06/13/2010  . Hypertension 06/13/2010  . Hyperlipidemia 06/13/2010    Harriet ButteEmily Alexxa Sabet, PT, MPT [facility] [address] [place], [place], [address] Phone: [phone]   Fax:  [phone] 04/04/20, 11:37 AM  Name: Chad Hall MRN: [id number] Date of Birth: 07/21/1950

## 2020-04-04 NOTE — Patient Instructions (Signed)

## 2020-04-04 NOTE — Telephone Encounter (Signed)
Pt has virtual appointment scheduled with PCP on 04/09/20. Chad Hall, CMA

## 2020-04-06 ENCOUNTER — Other Ambulatory Visit: Payer: Self-pay | Admitting: Physician Assistant

## 2020-04-08 ENCOUNTER — Other Ambulatory Visit: Payer: Self-pay

## 2020-04-08 ENCOUNTER — Encounter: Payer: Self-pay | Admitting: Physical Therapy

## 2020-04-08 ENCOUNTER — Other Ambulatory Visit: Payer: Self-pay | Admitting: Physician Assistant

## 2020-04-08 ENCOUNTER — Ambulatory Visit (INDEPENDENT_AMBULATORY_CARE_PROVIDER_SITE_OTHER): Payer: Medicare Other | Admitting: Physical Therapy

## 2020-04-08 DIAGNOSIS — R293 Abnormal posture: Secondary | ICD-10-CM | POA: Diagnosis not present

## 2020-04-08 DIAGNOSIS — R2681 Unsteadiness on feet: Secondary | ICD-10-CM

## 2020-04-08 DIAGNOSIS — R2689 Other abnormalities of gait and mobility: Secondary | ICD-10-CM | POA: Diagnosis not present

## 2020-04-08 DIAGNOSIS — M6281 Muscle weakness (generalized): Secondary | ICD-10-CM | POA: Diagnosis not present

## 2020-04-08 NOTE — Therapy (Signed)
[facility] Physical Therapy [address] [place], [place], [address] Phone: [phone]   Fax:  [phone]  Physical Therapy Treatment  Patient Details  Name: Chad Hall MRN: [id number] Date of Birth: [birth year] Referring Provider (PT): [place] Bali Persons, [place]   Encounter Date: 04/08/2020   PT End of Session - 04/08/20 1348    Visit Number 3    Number of Visits 25    Date for PT Re-Evaluation 06/26/20    Authorization Type UHC Medicare    Authorization Time Period 5% coinsurance    PT Start Time 1345    PT Stop Time 1430    PT Time Calculation (min) 45 min    Equipment Utilized During Treatment Gait belt    Activity Tolerance Patient tolerated treatment well    Behavior During Therapy [facility] for tasks assessed/performed           Past Medical History:  Diagnosis Date  . Allergy   . Blindness - both eyes 1967   Basketball injury  . Diabetes mellitus   . Hernia   . Hyperlipidemia   . Hypertension   . Marfan syndrome     Past Surgical History:  Procedure Laterality Date  . AMPUTATION Left 12/28/2018   Procedure: LEFT FOOT 3RD RAY AMPUTATION;  Surgeon: Nadara Mustard, MD;  Location: [facility] OR;  Service: Orthopedics;  Laterality: Left;  . AMPUTATION Left 01/03/2020   Procedure: LEFT BELOW KNEE AMPUTATION;  Surgeon: Nadara Mustard, MD;  Location: [facility] OR;  Service: Orthopedics;  Laterality: Left;  . APPENDECTOMY    . INGUINAL HERNIA REPAIR     Right 1970s,    Left 2011 by Dr Andrey Campanile  . INGUINAL HERNIA REPAIR  08/25/10   recurrent left  . PENILE PROSTHESIS IMPLANT  2000s   [facility], Dr Brunilda Payor  . RETINAL DETACHMENT SURGERY  1970  . Rotator Cuff Repair  2009   Left side, Dr Elita Quick   . ROTATOR CUFF REPAIR  2010-2011?   right side    There were no vitals filed for this visit.   Subjective Assessment - 04/08/20 1345    Subjective He has been wearing prosthesis 1-3 hrs on 1-3 hrs off (inconsistent).    Pertinent History Lt TTA, DM2, peripheral  neuropathy, HTN, Marfan's syndrome blindness 1967    Patient Stated Goals walk with prosthesis like before which was no walking device & used service animal in community    Currently in Pain? No/denies                             [facility] Adult PT Treatment/Exercise - 04/08/20 1345      Transfers   Transfers Sit to Stand;Stand to Sit    Sit to Stand 5: Supervision;With upper extremity assist;With armrests;From chair/3-in-1;Other (comment)    Sit to Stand Details Verbal cues for sequencing;Verbal cues for technique    Stand to Sit 5: Supervision;With upper extremity assist;With armrests;To chair/3-in-1;Other (comment)      Ambulation/Gait   Ambulation/Gait Yes    Ambulation/Gait Assistance 4: Min guard;4: Min assist   Min guard withRW & minA no device   Ambulation/Gait Assistance Details verbal & tactile cues for direction,  with RW cues on step length & upright posture;  without assistive device following voice for 20' with 180* turn at 57' and 100' counting steps to determine when to turn.    Ambulation Distance (Feet) 100 Feet   40' X 3 &  100' with RW and 20' & 100' no device   Assistive device Rolling walker;Prosthesis;None    Gait Pattern Step-to pattern;Decreased step length - right;Decreased stance time - left;Decreased hip/knee flexion - left;Decreased weight shift to left;Left hip hike;Lateral hip instability;Trunk flexed;Poor foot clearance - right    Ambulation Surface Level;Indoor      Neuro Re-ed    Neuro Re-ed Details  --      Prosthetics   Prosthetic Care Comments  PT recommended no lotion for next 2 days (until next PT session). Only should be using lotion if dry skin & then at night washing off in morning. PT instructed in number of ply socks donning & standing / gait with too few socks, too many socks and correct ply socks    Current prosthetic wear tolerance (days/week)  daily    Current prosthetic wear tolerance (#hours/day)  inconsistent with 1-3 hrs on  1-3 hrs off.  PT recommended consistency with 3 hrs on & 3 hrs off 3x/day.    Current prosthetic weight-bearing tolerance (hours/day)  --    Edema no overt edema noted today    Residual limb condition  skin sweaty & cloudy gray appearance indicating too moist. skin feels tacky from residual lotion.  scaly sking but no open areas noted.    Education Provided Proper Donning;Proper wear schedule/adjustment;Other (comment);Proper weight-bearing schedule/adjustment;Correct ply sock adjustment   see prosthetic care comments   Person(s) Educated Patient    Education Method Explanation;Verbal cues;Tactile cues    Education Method Verbalized understanding;Tactile cues required;Verbal cues required;Needs further instruction    Donning Prosthesis Supervision                    PT Short Term Goals - 03/28/20 1222      PT SHORT TERM GOAL #1   Title Patient donnes prosthesis correctly modified independent.    Time 4    Period Weeks    Status New    Target Date 04/27/20      PT SHORT TERM GOAL #2   Title Patient tolerates prosthesis wear >8 hrs total / day without increase in skin issues.    Time 4    Period Weeks    Status New    Target Date 04/27/20      PT SHORT TERM GOAL #3   Title Patient transfers sit to/from stand chair without armrest using UEs to push off chair but no external support required to stabilize independently.    Time 4    Period Weeks    Status New    Target Date 04/27/20      PT SHORT TERM GOAL #4   Title Patient ambulates 200' with walker & prosthesis with supervision.    Time 4    Period Weeks    Status New    Target Date 04/27/20             PT Long Term Goals - 03/28/20 1218      PT LONG TERM GOAL #1   Title Patient verbalizes & demonstrates understanding of proper prosthetic care to enable utilization of proshesis.    Time 12    Period Weeks    Status New    Target Date 06/26/20      PT LONG TERM GOAL #2   Title Patient tolerates  prosthesis wear >90% of awake hours without skin or limb discomfort issues to enable function throughout his day.    Time 12    Period Weeks  Status New    Target Date 06/26/20      PT LONG TERM GOAL #3   Title Berg Balance >/= 45/56    Time 12    Period Weeks    Status New    Target Date 06/26/20      PT LONG TERM GOAL #4   Title Patient ambulates >500' with cane or less and seeing eye dog with prosthesis modified independent.    Time 12    Period Weeks    Status New    Target Date 06/26/20      PT LONG TERM GOAL #5   Title Patient negotiates ramps & curbs with cane or less and seeing eye dog with prosthesis modified independent.    Time 12    Period Weeks    Status New    Target Date 06/26/20                 Plan - 04/08/20 1349    Clinical Impression Statement PT continued to educate on proper prosthetic care including wear, ply socks and skin management issues.  He is inconsistent with wear which increases risk of skin issues.    Personal Factors and Comorbidities Comorbidity 3+    Comorbidities Lt TTA, DM2, peripheral neuropathy, HTN, Marfan's syndrome blindness 1967    Examination-Activity Limitations Lift;Locomotion Level;Stairs;Stand;Transfers    Stability/Clinical Decision Making Evolving/Moderate complexity    Rehab Potential Good    PT Frequency 2x / week    PT Duration 12 weeks    PT Treatment/Interventions ADLs/Self Care Home Management;DME Instruction;Gait training;Stair training;Functional mobility training;Therapeutic activities;Therapeutic exercise;Balance training;Neuromuscular re-education;Patient/family education;Prosthetic Training    PT Next Visit Plan Pt to bring his seeing eye dog to PT session to work on gait with dog.  continue education on prosthetic care.    Consulted and Agree with Plan of Care Patient           Patient will benefit from skilled therapeutic intervention in order to improve the following deficits and impairments:   Abnormal gait,Decreased activity tolerance,Decreased balance,Decreased endurance,Decreased knowledge of use of DME,Decreased mobility,Decreased skin integrity,Decreased strength,Increased edema,Postural dysfunction,Prosthetic Dependency  Visit Diagnosis: Unsteadiness on feet  Other abnormalities of gait and mobility  Muscle weakness (generalized)  Abnormal posture     Problem List Patient Active Problem List   Diagnosis Date Noted  . Diabetic foot infection (HCC) 01/01/2020  . Maggot infestation 08/23/2019  . Pyogenic inflammation of bone (HCC)   . Osteomyelitis of foot, left, acute (HCC) 12/26/2018  . Right knee pain 10/04/2017  . AKI (acute kidney injury) (HCC) 07/18/2013  . Left hip pain 04/10/2013  . Diabetic peripheral neuropathy associated with type 2 diabetes mellitus (HCC) 11/10/2012  . Marfan's syndrome 08/08/2012  . Healthcare maintenance 08/08/2012  . Atopic dermatitis 02/16/2012  . Overweight (BMI 25.0-29.9) 07/05/2011  . Allergic rhinitis due to allergen 07/03/2011  . Venous insufficiency of leg 04/03/2011  . Blindness 07/23/2010  . DM (diabetes mellitus), type 2 with neurological complications (HCC) 06/13/2010  . Hypertension 06/13/2010  . Hyperlipidemia 06/13/2010    Vladimir Faster, PT, DPT 04/08/2020, 3:54 PM  [facility] Physical Therapy [address] [place], [place], [address] Phone: [phone]   Fax:  [phone]  Name: Chad Hall MRN: [id number] Date of Birth: 06-26-1950

## 2020-04-09 ENCOUNTER — Telehealth (INDEPENDENT_AMBULATORY_CARE_PROVIDER_SITE_OTHER): Payer: Medicare Other | Admitting: Family Medicine

## 2020-04-09 ENCOUNTER — Other Ambulatory Visit: Payer: Self-pay | Admitting: Physician Assistant

## 2020-04-09 DIAGNOSIS — E1142 Type 2 diabetes mellitus with diabetic polyneuropathy: Secondary | ICD-10-CM | POA: Diagnosis not present

## 2020-04-09 MED ORDER — GABAPENTIN 100 MG PO CAPS
300.0000 mg | ORAL_CAPSULE | Freq: Every day | ORAL | 3 refills | Status: DC
Start: 2020-04-09 — End: 2020-09-19

## 2020-04-09 MED ORDER — BETAMETHASONE DIPROPIONATE AUG 0.05 % EX CREA
TOPICAL_CREAM | Freq: Two times a day (BID) | CUTANEOUS | 2 refills | Status: DC
Start: 2020-04-09 — End: 2020-10-14

## 2020-04-09 NOTE — Progress Notes (Signed)
[facility] Telemedicine Visit  Patient consented to have virtual visit and was identified by name and date of birth. Method of visit: Telephone  Encounter participants: Patient: Chad Hall - located at home Provider: [name] - located at [facility] Others (if applicable): None  Chief Complaint: Check in with his doctor  HPI:  The primary reason Chad Hall wanted to talk today was to check in with his doctor. He informed me that he knows he has been absent from clinic for the past 6 months but that is because he has been working with other doctors to care for his known foot infection. In the past 6 months, he has required amputation for an osteomyelitis of his foot and is now in therapy to help use his new prosthesis. This seems to have all gone well although he does mourning the loss of his foot. We use this time to review his medications and he was encouraged to be seen in clinic in the next 1-2 months if possible.  Diabetic neuropathy He experiences numbness and discomfort of his remaining foot. This is an ongoing issue. He was previously been started on gabapentin 100 mg nightly. He notes that this does not seem to be giving him significant relief and would like to increase this dose if appropriate.  Hypertension Current medication includes: -Enalapril 20 mg daily -Metoprolol succinate 12.5 mg daily  ROS: per HPI  Pertinent PMHx: Hypertension, diabetes, recent osteomyelitis of the left foot requiring amputation  Exam:  Respiratory: Breathing comfortably on room air throughout the conversation. Able to complete sentences without significant effort.  Assessment/Plan:  Medications His medication list was reviewed and updated.  Diabetic neuropathy Gabapentin increased to 300 mg nightly  Diabetes His last A1c was well controlled though it is now more than 6 months old. His diabetic regimen is appropriate although he was informed that he would  benefit from an in person exam and new A1c measurement. He was encouraged to be seen in clinic in the next 1-2 months.  Hypertension Per chart review, his blood pressure does seem well controlled. He was informed that he would benefit from being seen in clinic for further assessment and monitoring of his kidney function by blood work. He was encouraged to be seen in clinic in the next 1-2 months.  Time spent during visit with patient: 15 minutes

## 2020-04-10 ENCOUNTER — Other Ambulatory Visit: Payer: Self-pay

## 2020-04-10 ENCOUNTER — Other Ambulatory Visit: Payer: Self-pay | Admitting: Physician Assistant

## 2020-04-10 ENCOUNTER — Ambulatory Visit (INDEPENDENT_AMBULATORY_CARE_PROVIDER_SITE_OTHER): Payer: Medicare Other | Admitting: Physical Therapy

## 2020-04-10 ENCOUNTER — Encounter: Payer: Self-pay | Admitting: Physical Therapy

## 2020-04-10 DIAGNOSIS — R2689 Other abnormalities of gait and mobility: Secondary | ICD-10-CM | POA: Diagnosis not present

## 2020-04-10 DIAGNOSIS — M6281 Muscle weakness (generalized): Secondary | ICD-10-CM

## 2020-04-10 DIAGNOSIS — R2681 Unsteadiness on feet: Secondary | ICD-10-CM

## 2020-04-10 DIAGNOSIS — R293 Abnormal posture: Secondary | ICD-10-CM | POA: Diagnosis not present

## 2020-04-10 NOTE — Therapy (Signed)
[facility] Physical Therapy [address] [place], [place], [address] Phone: [phone]   Fax:  [phone]  Physical Therapy Treatment  Patient Details  Name: Chad Hall MRN: [id number] Date of Birth: Sep 28, 1950 Referring Provider (PT): [place] Persons, [place]   Encounter Date: 04/10/2020   PT End of Session - 04/10/20 1345    Visit Number 4    Number of Visits 25    Date for PT Re-Evaluation 06/26/20    Authorization Type [employer] Medicare    Authorization Time Period 5% coinsurance    PT Start Time 1345    PT Stop Time 1430    PT Time Calculation (min) 45 min    Equipment Utilized During Treatment Gait belt    Activity Tolerance Patient tolerated treatment well    Behavior During Therapy [facility] for tasks assessed/performed           Past Medical History:  Diagnosis Date  . Allergy   . Blindness - both eyes 1967   Basketball injury  . Diabetes mellitus   . Hernia   . Hyperlipidemia   . Hypertension   . Marfan syndrome     Past Surgical History:  Procedure Laterality Date  . AMPUTATION Left 12/28/2018   Procedure: LEFT FOOT 3RD RAY AMPUTATION;  Surgeon: Nadara Mustard, MD;  Location: [facility] OR;  Service: Orthopedics;  Laterality: Left;  . AMPUTATION Left 01/03/2020   Procedure: LEFT BELOW KNEE AMPUTATION;  Surgeon: Nadara Mustard, MD;  Location: [facility] OR;  Service: Orthopedics;  Laterality: Left;  . APPENDECTOMY    . INGUINAL HERNIA REPAIR     Right 1970s,    Left 2011 by Dr Andrey Campanile  . INGUINAL HERNIA REPAIR  08/25/10   recurrent left  . PENILE PROSTHESIS IMPLANT  2000s   [facility], Dr Brunilda Payor  . RETINAL DETACHMENT SURGERY  1970  . Rotator Cuff Repair  2009   Left side, Dr Elita Quick   . ROTATOR CUFF REPAIR  2010-2011?   right side    There were no vitals filed for this visit.   Subjective Assessment - 04/10/20 1345    Subjective He did not use lotion for last 2 nights as PT requested. (His skin does not appear macerated by too much moisture).  He brought seeing eye dog to PT session.    Pertinent History Lt TTA, DM2, peripheral neuropathy, HTN, Marfan's syndrome blindness 1967    Patient Stated Goals walk with prosthesis like before which was no walking device & used service animal in community    Currently in Pain? No/denies                             [facility] Adult PT Treatment/Exercise - 04/10/20 1345      Transfers   Transfers Sit to Stand;Stand to Sit    Sit to Stand 5: Supervision;With upper extremity assist;With armrests;From chair/3-in-1;Other (comment)    Sit to Stand Details Verbal cues for sequencing;Verbal cues for technique    Stand to Sit 5: Supervision;With upper extremity assist;With armrests;To chair/3-in-1;Other (comment)      Ambulation/Gait   Ambulation/Gait Yes    Ambulation/Gait Assistance 4: Min assist    Ambulation/Gait Assistance Details manual / tactile and verbal cues on balance reactions, upright posture and weight shift over prosthesis in stance.  Worked on ambulating with seeing eye dog (dog following another person)    Ambulation Distance (Feet) 100 Feet   100' X 2  Assistive device Prosthesis;None;Other (Comment)   seeing eye dog   Gait Pattern Step-to pattern;Decreased step length - right;Decreased stance time - left;Decreased hip/knee flexion - left;Decreased weight shift to left;Left hip hike;Lateral hip instability;Trunk flexed;Poor foot clearance - right    Ramp 3: Mod assist   2 person HHA, visual cane & prosthesis   Ramp Details (indicate cue type and reason) tactile & verbal cues on technique.    Curb 3: Mod assist   visual cane & prosthesis   Curb Details (indicate cue type and reason) tactile & verbal cues on technique      Knee/Hip Exercises: Standing   Forward Step Up Right;1 set;10 reps;Hand Hold: 2;Step Height: 6"   //bars   Forward Step Up Limitations verbal cues on technique & using RLE to feel step & step prosthesis close to step prior to stepping up    Step  Down Left;1 set;10 reps;Hand Hold: 2;Step Height: 6"   //bars   Step Down Limitations verbal cues on technique including feeling edge of step with RLE prior to stepping down      Prosthetics   Prosthetic Care Comments  Continue with no lotion at night.    Current prosthetic wear tolerance (days/week)  daily    Current prosthetic wear tolerance (#hours/day)  3 hrs on & 3 hrs off 3x/day.    Edema no overt edema noted today    Residual limb condition  skin less macerated with less over moist appearance    Education Provided Proper Donning;Proper wear schedule/adjustment;Other (comment);Proper weight-bearing schedule/adjustment;Correct ply sock adjustment;Residual limb care   see prosthetic care comments   Person(s) Educated Patient    Education Method Explanation;Tactile cues;Verbal cues    Education Method Verbalized understanding;Verbal cues required;Needs further instruction                    PT Short Term Goals - 03/28/20 1222      PT SHORT TERM GOAL #1   Title Patient donnes prosthesis correctly modified independent.    Time 4    Period Weeks    Status New    Target Date 04/27/20      PT SHORT TERM GOAL #2   Title Patient tolerates prosthesis wear >8 hrs total / day without increase in skin issues.    Time 4    Period Weeks    Status New    Target Date 04/27/20      PT SHORT TERM GOAL #3   Title Patient transfers sit to/from stand chair without armrest using UEs to push off chair but no external support required to stabilize independently.    Time 4    Period Weeks    Status New    Target Date 04/27/20      PT SHORT TERM GOAL #4   Title Patient ambulates 200' with walker & prosthesis with supervision.    Time 4    Period Weeks    Status New    Target Date 04/27/20             PT Long Term Goals - 03/28/20 1218      PT LONG TERM GOAL #1   Title Patient verbalizes & demonstrates understanding of proper prosthetic care to enable utilization of  proshesis.    Time 12    Period Weeks    Status New    Target Date 06/26/20      PT LONG TERM GOAL #2   Title Patient tolerates prosthesis wear >90% of  awake hours without skin or limb discomfort issues to enable function throughout his day.    Time 12    Period Weeks    Status New    Target Date 06/26/20      PT LONG TERM GOAL #3   Title Berg Balance >/= 45/56    Time 12    Period Weeks    Status New    Target Date 06/26/20      PT LONG TERM GOAL #4   Title Patient ambulates >500' with cane or less and seeing eye dog with prosthesis modified independent.    Time 12    Period Weeks    Status New    Target Date 06/26/20      PT LONG TERM GOAL #5   Title Patient negotiates ramps & curbs with cane or less and seeing eye dog with prosthesis modified independent.    Time 12    Period Weeks    Status New    Target Date 06/26/20                 Plan - 04/10/20 1353    Clinical Impression Statement PT session began to work on prosthetic gait with his seeing eye dog.  PT also introduced ramps & curb negotiation today but will need significntly more training.    Personal Factors and Comorbidities Comorbidity 3+    Comorbidities Lt TTA, DM2, peripheral neuropathy, HTN, Marfan's syndrome blindness 1967    Examination-Activity Limitations Lift;Locomotion Level;Stairs;Stand;Transfers    Stability/Clinical Decision Making Evolving/Moderate complexity    Rehab Potential Good    PT Frequency 2x / week    PT Duration 12 weeks    PT Treatment/Interventions ADLs/Self Care Home Management;DME Instruction;Gait training;Stair training;Functional mobility training;Therapeutic activities;Therapeutic exercise;Balance training;Neuromuscular re-education;Patient/family education;Prosthetic Training    PT Next Visit Plan prosthetic gait with ambulatory cane & visual cane.     Pt to bring his seeing eye dog to PT session to work on gait with dog.  continue education on prosthetic care.     Consulted and Agree with Plan of Care Patient           Patient will benefit from skilled therapeutic intervention in order to improve the following deficits and impairments:  Abnormal gait,Decreased activity tolerance,Decreased balance,Decreased endurance,Decreased knowledge of use of DME,Decreased mobility,Decreased skin integrity,Decreased strength,Increased edema,Postural dysfunction,Prosthetic Dependency  Visit Diagnosis: Unsteadiness on feet  Other abnormalities of gait and mobility  Muscle weakness (generalized)  Abnormal posture     Problem List Patient Active Problem List   Diagnosis Date Noted  . Maggot infestation 08/23/2019  . Osteomyelitis of foot, left, acute (HCC) 12/26/2018  . Right knee pain 10/04/2017  . Left hip pain 04/10/2013  . Diabetic peripheral neuropathy associated with type 2 diabetes mellitus (HCC) 11/10/2012  . Marfan's syndrome 08/08/2012  . Atopic dermatitis 02/16/2012  . Overweight (BMI 25.0-29.9) 07/05/2011  . Allergic rhinitis due to allergen 07/03/2011  . Venous insufficiency of leg 04/03/2011  . Blindness 07/23/2010  . DM (diabetes mellitus), type 2 with neurological complications (HCC) 06/13/2010  . Hypertension 06/13/2010  . Hyperlipidemia 06/13/2010    Vladimir Faster, PT, DPT 04/10/2020, 8:59 PM  [facility] Physical Therapy [address] [place], [place], [address] Phone: [phone]   Fax:  [phone]  Name: RICHARD RITCHEY MRN: [id number] Date of Birth: 06-05-1950

## 2020-04-15 ENCOUNTER — Ambulatory Visit (INDEPENDENT_AMBULATORY_CARE_PROVIDER_SITE_OTHER): Payer: Medicare Other | Admitting: Physical Therapy

## 2020-04-15 ENCOUNTER — Encounter: Payer: Self-pay | Admitting: Physical Therapy

## 2020-04-15 ENCOUNTER — Other Ambulatory Visit: Payer: Self-pay

## 2020-04-15 DIAGNOSIS — R2689 Other abnormalities of gait and mobility: Secondary | ICD-10-CM | POA: Diagnosis not present

## 2020-04-15 DIAGNOSIS — M6281 Muscle weakness (generalized): Secondary | ICD-10-CM | POA: Diagnosis not present

## 2020-04-15 DIAGNOSIS — R2681 Unsteadiness on feet: Secondary | ICD-10-CM | POA: Diagnosis not present

## 2020-04-15 DIAGNOSIS — R293 Abnormal posture: Secondary | ICD-10-CM

## 2020-04-15 NOTE — Therapy (Signed)
[facility] Physical Therapy [address] [place], [place], [address] Phone: [phone]   Fax:  [phone]  Physical Therapy Treatment  Patient Details  Name: Chad Hall MRN: [id number] Date of Birth: 03-12-1950 Referring Provider (PT): [place] Persons, [place]   Encounter Date: 04/15/2020   PT End of Session - 04/15/20 1012    Visit Number 5    Number of Visits 25    Date for PT Re-Evaluation 06/26/20    Authorization Type [employer] Medicare    Authorization Time Period 5% coinsurance    PT Start Time 1015    PT Stop Time 1100    PT Time Calculation (min) 45 min    Equipment Utilized During Treatment Gait belt    Activity Tolerance Patient tolerated treatment well    Behavior During Therapy [facility] for tasks assessed/performed           Past Medical History:  Diagnosis Date  . Allergy   . Blindness - both eyes 1967   Basketball injury  . Diabetes mellitus   . Hernia   . Hyperlipidemia   . Hypertension   . Marfan syndrome     Past Surgical History:  Procedure Laterality Date  . AMPUTATION Left 12/28/2018   Procedure: LEFT FOOT 3RD RAY AMPUTATION;  Surgeon: Nadara Mustard, MD;  Location: [facility] OR;  Service: Orthopedics;  Laterality: Left;  . AMPUTATION Left 01/03/2020   Procedure: LEFT BELOW KNEE AMPUTATION;  Surgeon: Nadara Mustard, MD;  Location: [facility] OR;  Service: Orthopedics;  Laterality: Left;  . APPENDECTOMY    . INGUINAL HERNIA REPAIR     Right 1970s,    Left 2011 by Dr Andrey Campanile  . INGUINAL HERNIA REPAIR  08/25/10   recurrent left  . PENILE PROSTHESIS IMPLANT  2000s   [facility], Dr Brunilda Payor  . RETINAL DETACHMENT SURGERY  1970  . Rotator Cuff Repair  2009   Left side, Dr Elita Quick   . ROTATOR CUFF REPAIR  2010-2011?   right side    There were no vitals filed for this visit.   Subjective Assessment - 04/15/20 1013    Subjective He wore prosthesis 3hrs on & off 4hrs 2x/day.  he is walking at home some.    Pertinent History Lt TTA, DM2,  peripheral neuropathy, HTN, Marfan's syndrome blindness 1967    Patient Stated Goals walk with prosthesis like before which was no walking device & used service animal in community    Currently in Pain? No/denies                             [facility] Adult PT Treatment/Exercise - 04/15/20 1015      Transfers   Transfers Sit to Stand;Stand to Sit    Sit to Stand 5: Supervision;With upper extremity assist;With armrests;From chair/3-in-1;Other (comment)    Sit to Stand Details Verbal cues for sequencing;Verbal cues for technique    Stand to Sit 5: Supervision;With upper extremity assist;With armrests;To chair/3-in-1;Other (comment)      Ambulation/Gait   Ambulation/Gait Yes    Ambulation/Gait Assistance 5: Supervision;4: Min guard    Ambulation/Gait Assistance Details walking cane RUE verbal & tactile cues on sequence & balance reactions, progressed from cane & PT assist, then walking cane RUE vision cane LUE to walking cane RUE & seeing eye dog LUE    Ambulation Distance (Feet) 100 Feet   100' X 2   Assistive device Prosthesis;None;Other (Comment)   seeing eye  dog   Gait Pattern --    Ramp 3: Mod assist   TTA prosthesis 1st time with walking cane & visual cane, 2nd time walking cane & seeing eye dog   Ramp Details (indicate cue type and reason) tactile & verbal cues on technique.    Curb 3: Mod assist   TTA prosthesis 1st time with walking cane & visual cane, 2nd time walking cane & seeing eye dog   Curb Details (indicate cue type and reason) tactile & verbal cues on technique.      Knee/Hip Exercises: Standing   Forward Step Up --    Forward Step Up Limitations --    Step Down --    Step Down Limitations --      Prosthetics   Prosthetic Care Comments  Continue with no lotion at night.    Current prosthetic wear tolerance (days/week)  daily    Current prosthetic wear tolerance (#hours/day)  3 hrs on & 4 hrs off 2x/day PT recommended increase to 4hrs on & 3hrs off.     Edema no overt edema noted today    Residual limb condition  skin less macerated with less over moist appearance    Education Provided Proper Donning;Proper wear schedule/adjustment;Other (comment);Proper weight-bearing schedule/adjustment;Correct ply sock adjustment;Residual limb care   see prosthetic care comments   Person(s) Educated Patient    Education Method Explanation;Verbal cues    Education Method Verbalized understanding;Verbal cues required;Needs further instruction    Donning Prosthesis Supervision                    PT Short Term Goals - 03/28/20 1222      PT SHORT TERM GOAL #1   Title Patient donnes prosthesis correctly modified independent.    Time 4    Period Weeks    Status New    Target Date 04/27/20      PT SHORT TERM GOAL #2   Title Patient tolerates prosthesis wear >8 hrs total / day without increase in skin issues.    Time 4    Period Weeks    Status New    Target Date 04/27/20      PT SHORT TERM GOAL #3   Title Patient transfers sit to/from stand chair without armrest using UEs to push off chair but no external support required to stabilize independently.    Time 4    Period Weeks    Status New    Target Date 04/27/20      PT SHORT TERM GOAL #4   Title Patient ambulates 200' with walker & prosthesis with supervision.    Time 4    Period Weeks    Status New    Target Date 04/27/20             PT Long Term Goals - 03/28/20 1218      PT LONG TERM GOAL #1   Title Patient verbalizes & demonstrates understanding of proper prosthetic care to enable utilization of proshesis.    Time 12    Period Weeks    Status New    Target Date 06/26/20      PT LONG TERM GOAL #2   Title Patient tolerates prosthesis wear >90% of awake hours without skin or limb discomfort issues to enable function throughout his day.    Time 12    Period Weeks    Status New    Target Date 06/26/20      PT LONG TERM GOAL #3  Title Berg Balance >/= 45/56    Time  12    Period Weeks    Status New    Target Date 06/26/20      PT LONG TERM GOAL #4   Title Patient ambulates >500' with cane or less and seeing eye dog with prosthesis modified independent.    Time 12    Period Weeks    Status New    Target Date 06/26/20      PT LONG TERM GOAL #5   Title Patient negotiates ramps & curbs with cane or less and seeing eye dog with prosthesis modified independent.    Time 12    Period Weeks    Status New    Target Date 06/26/20                 Plan - 04/15/20 1013    Clinical Impression Statement Walking cane improved gait on level surfaces with vision cane or seeing eye dog. He continues to struggle with balance on ramps & curbs.    Personal Factors and Comorbidities Comorbidity 3+    Comorbidities Lt TTA, DM2, peripheral neuropathy, HTN, Marfan's syndrome blindness 1967    Examination-Activity Limitations Lift;Locomotion Level;Stairs;Stand;Transfers    Stability/Clinical Decision Making Evolving/Moderate complexity    Rehab Potential Good    PT Frequency 2x / week    PT Duration 12 weeks    PT Treatment/Interventions ADLs/Self Care Home Management;DME Instruction;Gait training;Stair training;Functional mobility training;Therapeutic activities;Therapeutic exercise;Balance training;Neuromuscular re-education;Patient/family education;Prosthetic Training    PT Next Visit Plan prosthetic gait with ambulatory cane & visual cane.     Pt to bring his seeing eye dog to PT session to work on gait with dog.  continue education on prosthetic care.    Consulted and Agree with Plan of Care Patient           Patient will benefit from skilled therapeutic intervention in order to improve the following deficits and impairments:  Abnormal gait,Decreased activity tolerance,Decreased balance,Decreased endurance,Decreased knowledge of use of DME,Decreased mobility,Decreased skin integrity,Decreased strength,Increased edema,Postural dysfunction,Prosthetic  Dependency  Visit Diagnosis: Unsteadiness on feet  Other abnormalities of gait and mobility  Muscle weakness (generalized)  Abnormal posture     Problem List Patient Active Problem List   Diagnosis Date Noted  . Maggot infestation 08/23/2019  . Osteomyelitis of foot, left, acute (HCC) 12/26/2018  . Right knee pain 10/04/2017  . Left hip pain 04/10/2013  . Diabetic peripheral neuropathy associated with type 2 diabetes mellitus (HCC) 11/10/2012  . Marfan's syndrome 08/08/2012  . Atopic dermatitis 02/16/2012  . Overweight (BMI 25.0-29.9) 07/05/2011  . Allergic rhinitis due to allergen 07/03/2011  . Venous insufficiency of leg 04/03/2011  . Blindness 07/23/2010  . DM (diabetes mellitus), type 2 with neurological complications (HCC) 06/13/2010  . Hypertension 06/13/2010  . Hyperlipidemia 06/13/2010    Vladimir Faster, PT, DPT 04/15/2020, 12:48 PM  [facility] Physical Therapy [address] [place], [place], [address] Phone: [phone]   Fax:  [phone]  Name: Chad Hall MRN: [id number] Date of Birth: March 09, 1950

## 2020-04-17 ENCOUNTER — Ambulatory Visit (INDEPENDENT_AMBULATORY_CARE_PROVIDER_SITE_OTHER): Payer: Medicare Other | Admitting: Physical Therapy

## 2020-04-17 ENCOUNTER — Other Ambulatory Visit: Payer: Self-pay

## 2020-04-17 ENCOUNTER — Encounter: Payer: Self-pay | Admitting: Physical Therapy

## 2020-04-17 DIAGNOSIS — M6281 Muscle weakness (generalized): Secondary | ICD-10-CM

## 2020-04-17 DIAGNOSIS — R293 Abnormal posture: Secondary | ICD-10-CM | POA: Diagnosis not present

## 2020-04-17 DIAGNOSIS — R2689 Other abnormalities of gait and mobility: Secondary | ICD-10-CM

## 2020-04-17 DIAGNOSIS — R2681 Unsteadiness on feet: Secondary | ICD-10-CM | POA: Diagnosis not present

## 2020-04-17 NOTE — Therapy (Signed)
[facility] Physical Therapy [address] [place], [place], [address] Phone: [phone]   Fax:  [phone]  Physical Therapy Treatment  Patient Details  Name: Chad Hall MRN: [id number] Date of Birth: Jun 18, 1950 Referring Provider (PT): [place] Persons, [place]   Encounter Date: 04/17/2020   PT End of Session - 04/17/20 1026    Visit Number 6    Number of Visits 25    Date for PT Re-Evaluation 06/26/20    Authorization Type [employer] Medicare    Authorization Time Period 5% coinsurance    PT Start Time 1018    PT Stop Time 1100    PT Time Calculation (min) 42 min    Equipment Utilized During Treatment Gait belt    Activity Tolerance Patient tolerated treatment well    Behavior During Therapy WFL for tasks assessed/performed           Past Medical History:  Diagnosis Date  . Allergy   . Blindness - both eyes 1967   Basketball injury  . Diabetes mellitus   . Hernia   . Hyperlipidemia   . Hypertension   . Marfan syndrome     Past Surgical History:  Procedure Laterality Date  . AMPUTATION Left 12/28/2018   Procedure: LEFT FOOT 3RD RAY AMPUTATION;  Surgeon: Nadara Mustard, MD;  Location: [facility] OR;  Service: Orthopedics;  Laterality: Left;  . AMPUTATION Left 01/03/2020   Procedure: LEFT BELOW KNEE AMPUTATION;  Surgeon: Nadara Mustard, MD;  Location: [facility] OR;  Service: Orthopedics;  Laterality: Left;  . APPENDECTOMY    . INGUINAL HERNIA REPAIR     Right 1970s,    Left 2011 by Dr Andrey Campanile  . INGUINAL HERNIA REPAIR  08/25/10   recurrent left  . PENILE PROSTHESIS IMPLANT  2000s   [facility], Dr Brunilda Payor  . RETINAL DETACHMENT SURGERY  1970  . Rotator Cuff Repair  2009   Left side, Dr Elita Quick   . ROTATOR CUFF REPAIR  2010-2011?   right side    There were no vitals filed for this visit.   Subjective Assessment - 04/17/20 1019    Subjective He is wearing prosthesis 4hrs on & 3 hrs off    Pertinent History Lt TTA, DM2, peripheral neuropathy, HTN, Marfan's  syndrome blindness 1967    Patient Stated Goals walk with prosthesis like before which was no walking device & used service animal in community    Currently in Pain? No/denies                             [facility] Adult PT Treatment/Exercise - 04/17/20 1018      Transfers   Transfers Sit to Stand;Stand to Sit    Sit to Stand 5: Supervision;With upper extremity assist;With armrests;From chair/3-in-1;Other (comment)    Sit to Stand Details Verbal cues for sequencing;Verbal cues for technique    Stand to Sit 5: Supervision;With upper extremity assist;With armrests;To chair/3-in-1;Other (comment)      Ambulation/Gait   Ambulation/Gait Yes    Ambulation/Gait Assistance 5: Supervision;4: Min guard    Ambulation/Gait Assistance Details Prosthetic gait with seeing eye dog indoors initially & progressed to outdoors on sidewalks including curb cuts.  Tactile cues on balance reactions & verbal cues on step width / cane placement.    Ambulation Distance (Feet) 300 Feet   100' X 2 indoors & 300' outdoors   Assistive device Prosthesis;None;Other (Comment)   seeing eye dog   Ambulation  Surface Level;Indoor;Outdoor;Paved    Ramp --    Curb --      Prosthetics   Prosthetic Care Comments  use lotion of rough, precallous area distal medial tibia.    Current prosthetic wear tolerance (days/week)  daily    Current prosthetic wear tolerance (#hours/day)  4hrs on & 2-3hrs off.  PT recommended increasing to 6hrs 2x/day drying limb/liner half way ea wear.    Edema no overt edema noted today    Residual limb condition  skin less macerated with less over moist appearance    Education Provided Proper Donning;Proper wear schedule/adjustment;Other (comment);Proper weight-bearing schedule/adjustment;Correct ply sock adjustment;Residual limb care   see prosthetic care comments   Person(s) Educated Patient    Education Method Explanation;Tactile cues;Verbal cues    Education Method Verbalized  understanding;Returned demonstration;Tactile cues required;Verbal cues required;Needs further instruction    Donning Prosthesis Supervision                    PT Short Term Goals - 03/28/20 1222      PT SHORT TERM GOAL #1   Title Patient donnes prosthesis correctly modified independent.    Time 4    Period Weeks    Status New    Target Date 04/27/20      PT SHORT TERM GOAL #2   Title Patient tolerates prosthesis wear >8 hrs total / day without increase in skin issues.    Time 4    Period Weeks    Status New    Target Date 04/27/20      PT SHORT TERM GOAL #3   Title Patient transfers sit to/from stand chair without armrest using UEs to push off chair but no external support required to stabilize independently.    Time 4    Period Weeks    Status New    Target Date 04/27/20      PT SHORT TERM GOAL #4   Title Patient ambulates 200' with walker & prosthesis with supervision.    Time 4    Period Weeks    Status New    Target Date 04/27/20             PT Long Term Goals - 03/28/20 1218      PT LONG TERM GOAL #1   Title Patient verbalizes & demonstrates understanding of proper prosthetic care to enable utilization of proshesis.    Time 12    Period Weeks    Status New    Target Date 06/26/20      PT LONG TERM GOAL #2   Title Patient tolerates prosthesis wear >90% of awake hours without skin or limb discomfort issues to enable function throughout his day.    Time 12    Period Weeks    Status New    Target Date 06/26/20      PT LONG TERM GOAL #3   Title Berg Balance >/= 45/56    Time 12    Period Weeks    Status New    Target Date 06/26/20      PT LONG TERM GOAL #4   Title Patient ambulates >500' with cane or less and seeing eye dog with prosthesis modified independent.    Time 12    Period Weeks    Status New    Target Date 06/26/20      PT LONG TERM GOAL #5   Title Patient negotiates ramps & curbs with cane or less and seeing eye dog with  prosthesis modified independent.    Time 12    Period Weeks    Status New    Target Date 06/26/20                 Plan - 04/17/20 1027    Clinical Impression Statement PT progressed prosthetic gait with cane stand alone tip & seeing eye dog outdoors on sidewalks.  His gait is progressing but requires assistance for safety. He is tolerating increased wear without skin issues.    Personal Factors and Comorbidities Comorbidity 3+    Comorbidities Lt TTA, DM2, peripheral neuropathy, HTN, Marfan's syndrome blindness 1967    Examination-Activity Limitations Lift;Locomotion Level;Stairs;Stand;Transfers    Stability/Clinical Decision Making Evolving/Moderate complexity    Rehab Potential Good    PT Frequency 2x / week    PT Duration 12 weeks    PT Treatment/Interventions ADLs/Self Care Home Management;DME Instruction;Gait training;Stair training;Functional mobility training;Therapeutic activities;Therapeutic exercise;Balance training;Neuromuscular re-education;Patient/family education;Prosthetic Training    PT Next Visit Plan prosthetic gait with ambulatory cane & visual cane.     Pt to bring his seeing eye dog to PT session to work on gait with dog.  continue education on prosthetic care.    Consulted and Agree with Plan of Care Patient           Patient will benefit from skilled therapeutic intervention in order to improve the following deficits and impairments:  Abnormal gait,Decreased activity tolerance,Decreased balance,Decreased endurance,Decreased knowledge of use of DME,Decreased mobility,Decreased skin integrity,Decreased strength,Increased edema,Postural dysfunction,Prosthetic Dependency  Visit Diagnosis: Unsteadiness on feet  Other abnormalities of gait and mobility  Muscle weakness (generalized)  Abnormal posture     Problem List Patient Active Problem List   Diagnosis Date Noted  . Maggot infestation 08/23/2019  . Osteomyelitis of foot, left, acute (HCC)  12/26/2018  . Right knee pain 10/04/2017  . Left hip pain 04/10/2013  . Diabetic peripheral neuropathy associated with type 2 diabetes mellitus (HCC) 11/10/2012  . Marfan's syndrome 08/08/2012  . Atopic dermatitis 02/16/2012  . Overweight (BMI 25.0-29.9) 07/05/2011  . Allergic rhinitis due to allergen 07/03/2011  . Venous insufficiency of leg 04/03/2011  . Blindness 07/23/2010  . DM (diabetes mellitus), type 2 with neurological complications (HCC) 06/13/2010  . Hypertension 06/13/2010  . Hyperlipidemia 06/13/2010    Vladimir Faster, PT, DPT 04/17/2020, 4:31 PM  [facility] Physical Therapy [address] [place], [place], [address] Phone: [phone]   Fax:  [phone]  Name: Chad Hall MRN: [id number] Date of Birth: 1950/04/07

## 2020-04-22 ENCOUNTER — Ambulatory Visit (INDEPENDENT_AMBULATORY_CARE_PROVIDER_SITE_OTHER): Payer: Medicare Other | Admitting: Physical Therapy

## 2020-04-22 ENCOUNTER — Encounter: Payer: Self-pay | Admitting: Physical Therapy

## 2020-04-22 ENCOUNTER — Other Ambulatory Visit: Payer: Self-pay

## 2020-04-22 DIAGNOSIS — M6281 Muscle weakness (generalized): Secondary | ICD-10-CM

## 2020-04-22 DIAGNOSIS — R2689 Other abnormalities of gait and mobility: Secondary | ICD-10-CM

## 2020-04-22 DIAGNOSIS — R293 Abnormal posture: Secondary | ICD-10-CM

## 2020-04-22 DIAGNOSIS — R2681 Unsteadiness on feet: Secondary | ICD-10-CM | POA: Diagnosis not present

## 2020-04-22 NOTE — Therapy (Signed)
[facility] Physical Therapy [address] [place], [place], [address] Phone: [phone]   Fax:  [phone]  Physical Therapy Treatment  Patient Details  Name: Chad Hall MRN: [id number] Date of Birth: 07/14/1950 Referring Provider (PT): Bevely Palmer Persons, [place]   Encounter Date: 04/22/2020   PT End of Session - 04/22/20 1101    Visit Number 7    Number of Visits 25    Date for PT Re-Evaluation 06/26/20    Authorization Type [employer]HC Medicare    Authorization Time Period 5% coinsurance    PT Start Time 1059    PT Stop Time 1141    PT Time Calculation (min) 42 min    Equipment Utilized During Treatment Gait belt    Activity Tolerance Patient tolerated treatment well    Behavior During Therapy [facility] for tasks assessed/performed           Past Medical History:  Diagnosis Date  . Allergy   . Blindness - both eyes 1967   Basketball injury  . Diabetes mellitus   . Hernia   . Hyperlipidemia   . Hypertension   . Marfan syndrome     Past Surgical History:  Procedure Laterality Date  . AMPUTATION Left 12/28/2018   Procedure: LEFT FOOT 3RD RAY AMPUTATION;  Surgeon: Newt Minion, MD;  Location: [place];  Service: Orthopedics;  Laterality: Left;  . AMPUTATION Left 01/03/2020   Procedure: LEFT BELOW KNEE AMPUTATION;  Surgeon: Newt Minion, MD;  Location: [facility];  Service: Orthopedics;  Laterality: Left;  . APPENDECTOMY    . INGUINAL HERNIA REPAIR     Right 1970s,    Left 2011 by Dr Redmond Pulling  . INGUINAL HERNIA REPAIR  08/25/10   recurrent left  . [facility], Dr Janice Norrie  . RETINAL DETACHMENT SURGERY  1970  . Rotator Cuff Repair  2009   Left side, Dr Hal Morales   . ROTATOR CUFF REPAIR  2010-2011?   right side    There were no vitals filed for this visit.   Subjective Assessment - 04/22/20 1100    Subjective He is wearing prosthesis 5hrs on & 3-4 hrs off and second wear until bedtime    Pertinent History Lt TTA, DM2,  peripheral neuropathy, HTN, Marfan's syndrome blindness 1967    Patient Stated Goals walk with prosthesis like before which was no walking device & used service animal in community                             [facility] Adult PT Treatment/Exercise - 04/22/20 1100      Transfers   Transfers Sit to Stand;Stand to Sit    Sit to Stand 5: Supervision;With upper extremity assist;With armrests;From chair/3-in-1;Other (comment)    Sit to Stand Details Verbal cues for sequencing;Verbal cues for technique    Stand to Sit 5: Supervision;With upper extremity assist;With armrests;To chair/3-in-1;Other (comment)      Ambulation/Gait   Ambulation/Gait Yes    Ambulation/Gait Assistance 5: Supervision;4: Min guard    Ambulation Distance (Feet) 350 Feet   100' X 2 indoors & 350' outdoors   Assistive device Prosthesis;None;Other (Comment)   seeing eye dog   Ambulation Surface Level;Indoor;Outdoor;Paved   mildly inclined sidewalks   Ramp 4: Min assist   curb cuts outdoors with seeing eye dog & prosthesis   Curb 4: Min assist   outdoors with seeing eye dog & prosthesis.  Curb Details (indicate cue type and reason) verbal cues on sequence & manual assist for balance      High Level Balance   High Level Balance Activities Side stepping;Backward walking    High Level Balance Comments progressed from single UE support on //bar to no UE support but bars close.      Neuro Re-ed    Neuro Re-ed Details  In //bars:  solid ground feet 2" width apart head turns right/left & up/down with supervision;  foam surface hip width apart head turns with minA / tactile cues for balance.  prosthetic foot on foam mat, stepping RLE back & forth over foam mat with LUE support on bar with supervision.      Knee/Hip Exercises: Standing   Forward Step Up Right;Left;1 set;5 reps;Hand Hold: 1;Step Height: 6"    Step Down Left;Right;1 set;5 reps;Step Height: 6";Hand Hold: 1      Prosthetics   Prosthetic Care Comments   increase wear to awake hours except 2 hrs midday.    Current prosthetic wear tolerance (days/week)  daily    Current prosthetic wear tolerance (#hours/day)  5hrs on & 3-4hrs off.  second wear until bedtime    Edema no overt edema noted today    Residual limb condition  skin less macerated with less over moist appearance    Education Provided Proper wear schedule/adjustment;Other (comment)   see prosthetic care comments   Person(s) Educated Patient    Education Method Explanation;Verbal cues    Education Method Verbalized understanding;Needs further instruction                    PT Short Term Goals - 04/22/20 1214      PT SHORT TERM GOAL #1   Title Patient donnes prosthesis correctly modified independent.    Baseline MET 04/22/2020    Time 4    Period Weeks    Status Achieved    Target Date 04/27/20      PT SHORT TERM GOAL #2   Title Patient tolerates prosthesis wear >8 hrs total / day without increase in skin issues.    Baseline MET 04/22/2020    Time 4    Period Weeks    Status Achieved    Target Date 04/27/20      PT SHORT TERM GOAL #3   Title Patient transfers sit to/from stand chair without armrest using UEs to push off chair but no external support required to stabilize independently.    Baseline MET 04/22/2020    Time 4    Period Weeks    Status Achieved    Target Date 04/27/20      PT SHORT TERM GOAL #4   Title Patient ambulates 200' with walker & prosthesis with supervision.    Baseline MET per pt report 04/22/2020    Time 4    Period Weeks    Status Achieved    Target Date 04/27/20             PT Long Term Goals - 03/28/20 1218      PT LONG TERM GOAL #1   Title Patient verbalizes & demonstrates understanding of proper prosthetic care to enable utilization of proshesis.    Time 12    Period Weeks    Status New    Target Date 06/26/20      PT LONG TERM GOAL #2   Title Patient tolerates prosthesis wear >90% of awake hours without skin or limb  discomfort issues to enable  function throughout his day.    Time 12    Period Weeks    Status New    Target Date 06/26/20      PT LONG TERM GOAL #3   Title Berg Balance >/= 45/56    Time 12    Period Weeks    Status New    Target Date 06/26/20      PT LONG TERM GOAL #4   Title Patient ambulates >500' with cane or less and seeing eye dog with prosthesis modified independent.    Time 12    Period Weeks    Status New    Target Date 06/26/20      PT LONG TERM GOAL #5   Title Patient negotiates ramps & curbs with cane or less and seeing eye dog with prosthesis modified independent.    Time 12    Period Weeks    Status New    Target Date 06/26/20                 Plan - 04/22/20 1101    Clinical Impression Statement Patient met all STGs set for first 30 days. PT worked on prosthetic gait with seeing eye dog including outdoors.  Weather was windy which patient reports cause sensory issues with his blindness so stays home on those days unless he has to go out.    Personal Factors and Comorbidities Comorbidity 3+    Comorbidities Lt TTA, DM2, peripheral neuropathy, HTN, Marfan's syndrome blindness 1967    Examination-Activity Limitations Lift;Locomotion Level;Stairs;Stand;Transfers    Stability/Clinical Decision Making Evolving/Moderate complexity    Rehab Potential Good    PT Frequency 2x / week    PT Duration 12 weeks    PT Treatment/Interventions ADLs/Self Care Home Management;DME Instruction;Gait training;Stair training;Functional mobility training;Therapeutic activities;Therapeutic exercise;Balance training;Neuromuscular re-education;Patient/family education;Prosthetic Training    PT Next Visit Plan prosthetic gait with ambulatory cane & visual cane.     Pt to bring his seeing eye dog to PT session to work on gait with dog.  continue education on prosthetic care.    Consulted and Agree with Plan of Care Patient           Patient will benefit from skilled therapeutic  intervention in order to improve the following deficits and impairments:  Abnormal gait,Decreased activity tolerance,Decreased balance,Decreased endurance,Decreased knowledge of use of DME,Decreased mobility,Decreased skin integrity,Decreased strength,Increased edema,Postural dysfunction,Prosthetic Dependency  Visit Diagnosis: Unsteadiness on feet  Other abnormalities of gait and mobility  Muscle weakness (generalized)  Abnormal posture     Problem List Patient Active Problem List   Diagnosis Date Noted  . Maggot infestation 08/23/2019  . Osteomyelitis of foot, left, acute ([place]) 12/26/2018  . Right knee pain 10/04/2017  . Left hip pain 04/10/2013  . Diabetic peripheral neuropathy associated with type 2 diabetes mellitus ([place]) 11/10/2012  . Marfan's syndrome 08/08/2012  . Atopic dermatitis 02/16/2012  . Overweight (BMI 25.0-29.9) 07/05/2011  . Allergic rhinitis due to allergen 07/03/2011  . Venous insufficiency of leg 04/03/2011  . Blindness 07/23/2010  . DM (diabetes mellitus), type 2 with neurological complications ([place]) 41/96/2229  . Hypertension 06/13/2010  . Hyperlipidemia 06/13/2010    Jamey Reas, PT, DPT 04/22/2020, 12:21 PM  [facility] Physical Therapy [address] [place], [place], [address] Phone: [phone]   Fax:  [phone]  Name: KEANTE URIZAR MRN: [id number] Date of Birth: 1950-10-31

## 2020-04-24 ENCOUNTER — Ambulatory Visit (INDEPENDENT_AMBULATORY_CARE_PROVIDER_SITE_OTHER): Payer: Medicare Other | Admitting: Physical Therapy

## 2020-04-24 ENCOUNTER — Other Ambulatory Visit: Payer: Self-pay

## 2020-04-24 ENCOUNTER — Encounter: Payer: Self-pay | Admitting: Physical Therapy

## 2020-04-24 DIAGNOSIS — R293 Abnormal posture: Secondary | ICD-10-CM

## 2020-04-24 DIAGNOSIS — R2689 Other abnormalities of gait and mobility: Secondary | ICD-10-CM | POA: Diagnosis not present

## 2020-04-24 DIAGNOSIS — R2681 Unsteadiness on feet: Secondary | ICD-10-CM | POA: Diagnosis not present

## 2020-04-24 DIAGNOSIS — M6281 Muscle weakness (generalized): Secondary | ICD-10-CM

## 2020-04-24 NOTE — Therapy (Signed)
[facility] [address] [place], [place], [address] Phone: [phone]   Fax:  [phone]  Physical Therapy Treatment  Patient Details  Name: Chad Hall MRN: [id number] Date of Birth: [birth year] Referring Provider (PT): [place] Persons, [place]   Encounter Date: 04/24/2020   PT End of Session - 04/24/20 1058    Visit Number 8    Number of Visits 25    Date for PT Re-Evaluation 06/26/20    Authorization Type UHC Medicare    Authorization Time Period 5% coinsurance    PT Start Time 1100    PT Stop Time 1142    PT Time Calculation (min) 42 min    Equipment Utilized During Treatment Gait belt    Activity Tolerance Patient tolerated treatment well    Behavior During Therapy [facility] for tasks assessed/performed           Past Medical History:  Diagnosis Date  . Allergy   . Blindness - both eyes 1967   Basketball injury  . Diabetes mellitus   . Hernia   . Hyperlipidemia   . Hypertension   . Marfan syndrome     Past Surgical History:  Procedure Laterality Date  . AMPUTATION Left 12/28/2018   Procedure: LEFT FOOT 3RD RAY AMPUTATION;  Surgeon: Nadara Mustard, MD;  Location: [facility] OR;  Service: Orthopedics;  Laterality: Left;  . AMPUTATION Left 01/03/2020   Procedure: LEFT BELOW KNEE AMPUTATION;  Surgeon: Nadara Mustard, MD;  Location: [facility] OR;  Service: Orthopedics;  Laterality: Left;  . APPENDECTOMY    . INGUINAL HERNIA REPAIR     Right 1970s,    Left 2011 by Dr Andrey Campanile  . INGUINAL HERNIA REPAIR  08/25/10   recurrent left  . PENILE PROSTHESIS IMPLANT  2000s   [facility], Dr Brunilda Payor  . RETINAL DETACHMENT SURGERY  1970  . Rotator Cuff Repair  2009   Left side, Dr Elita Quick   . ROTATOR CUFF REPAIR  2010-2011?   right side    There were no vitals filed for this visit.   Subjective Assessment - 04/24/20 1058    Subjective Prosthesis is causing callous area that has become tender.    Pertinent History Lt TTA, DM2, peripheral neuropathy,  HTN, Marfan's syndrome blindness 1967    Patient Stated Goals walk with prosthesis like before which was no walking device & used service animal in community    Currently in Pain? No/denies                             [facility] Adult PT Treatment/Exercise - 04/24/20 1100      Transfers   Transfers Sit to Stand;Stand to Sit    Sit to Stand 5: Supervision;With upper extremity assist;With armrests;From chair/3-in-1;Other (comment)    Sit to Stand Details Verbal cues for sequencing;Verbal cues for technique    Sit to Stand Details (indicate cue type and reason) instructed in standing from chairs without armrests using UEs    Stand to Sit 5: Supervision;With upper extremity assist;With armrests;To chair/3-in-1;Other (comment)    Stand to Sit Details (indicate cue type and reason) Tactile cues for sequencing;Verbal cues for technique    Stand to Sit Details instructed in sitting to chairs without armrests using UEs      Ambulation/Gait   Ambulation/Gait Yes    Ambulation/Gait Assistance 5: Supervision    Ambulation/Gait Assistance Details worked on ambulating at household level without device except prosthesis.  Pt following music to area with no obstacles except doorway.    Ambulation Distance (Feet) 75 Feet   30' X 4, 150' with dog   Assistive device Prosthesis;None;Other (Comment)   seeing eye dog for longer distances     High Level Balance   High Level Balance Activities Side stepping;Backward walking;Turns      Self-Care   Self-Care Lifting    Lifting PT instructed with verbal & pt feeling proper technique with TTA prosthesis to pick up items from floor. Pt return demo understanding 3 reps progressing from contact assist to supervision.      Prosthetics   Prosthetic Care Comments  Wear Vivewear shrinker under liner, check liner regualarly during day for shrinkage and will need to adjust ply socks to accommodate shrinker. .PT recorded on his device how to determine proper  ply socks.    Current prosthetic wear tolerance (days/week)  daily    Current prosthetic wear tolerance (#hours/day)  5hrs on & 3-4hrs off.  second wear until bedtime    Edema no overt edema noted today    Residual limb condition  distal medial tibia area with callous with some breakdown, no signs of infection but color change from increased moisture.    Education Provided Proper wear schedule/adjustment;Other (comment)   see prosthetic care comments   Person(s) Educated Patient    Education Method Explanation;Verbal cues    Education Method Verbalized understanding;Verbal cues required;Needs further instruction                    PT Short Term Goals - 04/24/20 1201      PT SHORT TERM GOAL #1   Title Patient reports proper prosthetic care for sweat management & adjusting ply fit.    Time 4    Period Weeks    Status New    Target Date 05/23/20      PT SHORT TERM GOAL #2   Title Berg Balance >36/56    Time 4    Period Weeks    Status Revised    Target Date 05/23/20      PT SHORT TERM GOAL #3   Title Patient ambulates in simulated household environment with prosthesis only with supervision.    Time 4    Period Weeks    Status New    Target Date 05/23/20      PT SHORT TERM GOAL #4   Title Patient ambulates 400' with seeing eye dog & ambulatory cane with supervision.    Time 4    Period Weeks    Status Revised    Target Date 05/23/20      PT SHORT TERM GOAL #5   Title Patient negotiates ramps / curb cuts & curbs with seeing eye dog & ambulatory cane with supervision.    Time 4    Period Weeks    Status New    Target Date 05/23/20             PT Long Term Goals - 04/24/20 1205      PT LONG TERM GOAL #1   Title Patient verbalizes & demonstrates understanding of proper prosthetic care to enable utilization of proshesis.    Time 12    Period Weeks    Status On-going    Target Date 06/26/20      PT LONG TERM GOAL #2   Title Patient tolerates prosthesis  wear >90% of awake hours without skin or limb discomfort issues to enable function throughout his day.  Time 12    Period Weeks    Status On-going    Target Date 05/23/20      PT LONG TERM GOAL #3   Title Berg Balance >/= 45/56    Time 12    Period Weeks    Status On-going    Target Date 05/23/20      PT LONG TERM GOAL #4   Title Patient ambulates >500' with cane or less and seeing eye dog with prosthesis modified independent.    Time 12    Period Weeks    Status On-going    Target Date 05/23/20      PT LONG TERM GOAL #5   Title Patient negotiates ramps & curbs with cane or less and seeing eye dog with prosthesis modified independent.    Time 12    Period Weeks    Status On-going    Target Date 05/23/20                 Plan - 04/24/20 1058    Clinical Impression Statement PT worked on mobility at household level without device except prosthesis. pt improved with instruction & practice.    Personal Factors and Comorbidities Comorbidity 3+    Comorbidities Lt TTA, DM2, peripheral neuropathy, HTN, Marfan's syndrome blindness 1967    Examination-Activity Limitations Lift;Locomotion Level;Stairs;Stand;Transfers    Stability/Clinical Decision Making Evolving/Moderate complexity    Rehab Potential Good    PT Frequency 2x / week    PT Duration 12 weeks    PT Treatment/Interventions ADLs/Self Care Home Management;DME Instruction;Gait training;Stair training;Functional mobility training;Therapeutic activities;Therapeutic exercise;Balance training;Neuromuscular re-education;Patient/family education;Prosthetic Training    PT Next Visit Plan prosthetic gait with ambulatory cane & visual cane.     Pt to bring his seeing eye dog to PT session to work on gait with dog.  continue education on prosthetic care.    Consulted and Agree with Plan of Care Patient           Patient will benefit from skilled therapeutic intervention in order to improve the following deficits and  impairments:  Abnormal gait,Decreased activity tolerance,Decreased balance,Decreased endurance,Decreased knowledge of use of DME,Decreased mobility,Decreased skin integrity,Decreased strength,Increased edema,Postural dysfunction,Prosthetic Dependency  Visit Diagnosis: Unsteadiness on feet  Other abnormalities of gait and mobility  Muscle weakness (generalized)  Abnormal posture     Problem List Patient Active Problem List   Diagnosis Date Noted  . Maggot infestation 08/23/2019  . Osteomyelitis of foot, left, acute (HCC) 12/26/2018  . Right knee pain 10/04/2017  . Left hip pain 04/10/2013  . Diabetic peripheral neuropathy associated with type 2 diabetes mellitus (HCC) 11/10/2012  . Marfan's syndrome 08/08/2012  . Atopic dermatitis 02/16/2012  . Overweight (BMI 25.0-29.9) 07/05/2011  . Allergic rhinitis due to allergen 07/03/2011  . Venous insufficiency of leg 04/03/2011  . Blindness 07/23/2010  . DM (diabetes mellitus), type 2 with neurological complications (HCC) 06/13/2010  . Hypertension 06/13/2010  . Hyperlipidemia 06/13/2010    Vladimir Faster, PT, DPT 04/24/2020, 12:08 PM  [facility] [address] [place], [place], [address] Phone: [phone]   Fax:  [phone]  Name: Chad Hall MRN: [id number] Date of Birth: 1950-04-08

## 2020-04-29 ENCOUNTER — Encounter: Payer: Self-pay | Admitting: Physical Therapy

## 2020-04-29 ENCOUNTER — Other Ambulatory Visit: Payer: Self-pay

## 2020-04-29 ENCOUNTER — Ambulatory Visit (INDEPENDENT_AMBULATORY_CARE_PROVIDER_SITE_OTHER): Payer: Medicare Other | Admitting: Physical Therapy

## 2020-04-29 DIAGNOSIS — R293 Abnormal posture: Secondary | ICD-10-CM | POA: Diagnosis not present

## 2020-04-29 DIAGNOSIS — R2689 Other abnormalities of gait and mobility: Secondary | ICD-10-CM | POA: Diagnosis not present

## 2020-04-29 DIAGNOSIS — M6281 Muscle weakness (generalized): Secondary | ICD-10-CM

## 2020-04-29 DIAGNOSIS — R2681 Unsteadiness on feet: Secondary | ICD-10-CM | POA: Diagnosis not present

## 2020-04-29 NOTE — Therapy (Addendum)
[facility] [address] [place], [place], [address] Phone: [phone]   Fax:  [phone]  Physical Therapy Treatment  Patient Details  Name: Chad Gibsonhomas W Cumba MRN: [id number] Date of Birth: [birth year][facility]25/1952 Referring Provider (PT): [place] Anne [place]ons, [place]   Encounter Date: 04/29/2020   04/29/20 1130  PT Visits / Re-Eval  Visit Number 9  Number of Visits 25  Date for PT Re-Evaluation 06/26/20  Authorization  Authorization Type U[employer] Medicare  Authorization Time Period 5% coinsurance  PT Time Calculation  PT Start Time 1100  PT Stop Time 1145  PT Time Calculation (min) 45 min  PT - End of Session  Equipment Utilized During Treatment Gait belt  Activity Tolerance Patient tolerated treatment well  Behavior During Therapy [facility] for tasks assessed/performed     Past Medical History:  Diagnosis Date  . Allergy   . Blindness - both eyes 1967   Basketball injury  . Diabetes mellitus   . Hernia   . Hyperlipidemia   . Hypertension   . Marfan syndrome     Past Surgical History:  Procedure Laterality Date  . AMPUTATION Left 12/28/2018   Procedure: LEFT FOOT 3RD RAY AMPUTATION;  Surgeon: Nadara Mustarduda, Marcus V, MD;  [facility];  Service: Orthopedics;  Laterality: Left;  . AMPUTATION Left 01/03/2020   Procedure: LEFT BELOW KNEE AMPUTATION;  Surgeon: Nadara Mustarduda, Marcus V, MD;  [facility];  Service: Orthopedics;  Laterality: Left;  . APPENDECTOMY    . INGUINAL HERNIA REPAIR     Right 1970s,    Left 2011 by Dr Andrey CampanileWilson  . INGUINAL HERNIA REPAIR  08/25/10   recurrent left  . PENILE PROSTHESIS IMPLANT  2000s   [facility], Dr Brunilda PayorNesi  . RETINAL DETACHMENT SURGERY  1970  . Rotator Cuff Repair  2009   Left side, Dr Elita Quickowen   . ROTATOR CUFF REPAIR  2010-2011?   right side    There were no vitals filed for this visit.   Subjective Assessment - 04/29/20 1100    Subjective He wore prosthesis most of awake hours but limited activity because area on leg was  hurting worse.  He tried shrinker under liner but prosthesis would not stay on his leg.    Pertinent History Lt TTA, DM2, peripheral neuropathy, HTN, Marfan's syndrome blindness 1967    Patient Stated Goals walk with prosthesis like before which was no walking device & used service animal in community    Currently in Pain? No/denies                             [facility] Adult PT Treatment/Exercise - 04/29/20 1100      Transfers   Transfers Sit to Stand;Stand to Sit    Sit to Stand 5: Supervision;With upper extremity assist;With armrests;From chair/3-in-1;Other (comment)    Sit to Stand Details Verbal cues for sequencing;Verbal cues for technique    Stand to Sit 5: Supervision;With upper extremity assist;With armrests;To chair/3-in-1;Other (comment)    Stand to Sit Details (indicate cue type and reason) Tactile cues for sequencing;Verbal cues for technique      Ambulation/Gait   Ambulation/Gait Yes    Ambulation/Gait Assistance 5: Supervision    Ambulation/Gait Assistance Details manual / tactile cues on balance reactions when he got his feet crossed and verbal cues on upright posture.    Ambulation Distance (Feet) 300 Feet   300' with dog & 2350' X 2 inside with prosthesis only  Assistive device Prosthesis;None;Other (Comment)   seeing eye dog for longer distances   Ambulation Surface Level;Indoor    Stairs Yes    Stairs Assistance 5: Supervision    Stairs Assistance Details (indicate cue type and reason) verbal cues on ambulatory cane use with single rail similar to his deck.    Stair Management Technique One rail Right;With cane;Alternating pattern;Step to pattern;Forwards   alternating ascend & step to descend   Number of Stairs 6    Height of Stairs 6    Ramp 5: Supervision   curb cut outdoors with seeing eye dog & prosthesis   Curb 4: Min assist   outdoors with seeing eye dog & prosthesis   Curb Details (indicate cue type and reason) PT cued on position of lead leash  (not harness) as dangling low across his body is trip hazard.      High Level Balance   High Level Balance Activities --      Self-Care   Self-Care --    Lifting --      Prosthetics   Prosthetic Care Comments  He has an appt with prosthetist on Thursday, 3/17.  PT instructed with demo while applying Tegaderm to wound. PT recommended use when wearing prosthesis & removing at night.  Clean wound before & after Tegaderm wear.  Vivewear shrinker without Tegaderm under liner is preferable.    Current prosthetic wear tolerance (days/week)  daily    Current prosthetic wear tolerance (#hours/day)  5hrs on & 3-4hrs off.  second wear until bedtime    Edema no overt edema noted today    Residual limb condition  distal medial tibia area with callous & wound 1cm with no signs of infection, no signs of infection but color change from increased moisture.    Education Provided Proper wear schedule/adjustment;Other (comment);Residual limb care;Correct ply sock adjustment   see prosthetic care comments   Person(s) Educated Patient    Education Method Explanation;Demonstration;Tactile cues;Verbal cues    Education Method Verbalized understanding;Returned demonstration;Tactile cues required;Verbal cues required;Needs further instruction                    PT Short Term Goals - 04/24/20 1201      PT SHORT TERM GOAL #1   Title Patient reports proper prosthetic care for sweat management & adjusting ply fit.    Time 4    Period Weeks    Status New    Target Date 05/23/20      PT SHORT TERM GOAL #2   Title Berg Balance >36/56    Time 4    Period Weeks    Status Revised    Target Date 05/23/20      PT SHORT TERM GOAL #3   Title Patient ambulates in simulated household environment with prosthesis only with supervision.    Time 4    Period Weeks    Status New    Target Date 05/23/20      PT SHORT TERM GOAL #4   Title Patient ambulates 400' with seeing eye dog & ambulatory cane with  supervision.    Time 4    Period Weeks    Status Revised    Target Date 05/23/20      PT SHORT TERM GOAL #5   Title Patient negotiates ramps / curb cuts & curbs with seeing eye dog & ambulatory cane with supervision.    Time 4    Period Weeks    Status New  Target Date 05/23/20             PT Long Term Goals - 04/24/20 1205      PT LONG TERM GOAL #1   Title Patient verbalizes & demonstrates understanding of proper prosthetic care to enable utilization of proshesis.    Time 12    Period Weeks    Status On-going    Target Date 06/26/20      PT LONG TERM GOAL #2   Title Patient tolerates prosthesis wear >90% of awake hours without skin or limb discomfort issues to enable function throughout his day.    Time 12    Period Weeks    Status On-going    Target Date 05/23/20      PT LONG TERM GOAL #3   Title Berg Balance >/= 45/56    Time 12    Period Weeks    Status On-going    Target Date 05/23/20      PT LONG TERM GOAL #4   Title Patient ambulates >500' with cane or less and seeing eye dog with prosthesis modified independent.    Time 12    Period Weeks    Status On-going    Target Date 05/23/20      PT LONG TERM GOAL #5   Title Patient negotiates ramps & curbs with cane or less and seeing eye dog with prosthesis modified independent.    Time 12    Period Weeks    Status On-going    Target Date 05/23/20                 Plan - 04/29/20 1131    Clinical Impression Statement Patient now has a wound on distal lateral limb. PT recommending Vivewear shrinker under liner but pt reports prosthesis kept slipping off limb. PT instructed in Tegaderm application & wear. He appears to understand.    Personal Factors and Comorbidities Comorbidity 3+    Comorbidities Lt TTA, DM2, peripheral neuropathy, HTN, Marfan's syndrome blindness 1967    Examination-Activity Limitations Lift;Locomotion Level;Stairs;Stand;Transfers    Stability/Clinical Decision Making  Evolving/Moderate complexity    Rehab Potential Good    PT Frequency 2x / week    PT Duration 12 weeks    PT Treatment/Interventions ADLs/Self Care Home Management;DME Instruction;Gait training;Stair training;Functional mobility training;Therapeutic activities;Therapeutic exercise;Balance training;Neuromuscular re-education;Patient/family education;Prosthetic Training    PT Next Visit Plan Do 10th visit progress note.  He has appt with prosthetist on 3/17 with recommendation to grind distolateral socket, add pretibial pads & check alignment.  prosthetic gait with ambulatory cane & visual cane.     Pt to bring his seeing eye dog to PT session to work on gait with dog.  continue education on prosthetic care.    Consulted and Agree with Plan of Care Patient           Patient will benefit from skilled therapeutic intervention in order to improve the following deficits and impairments:  Abnormal gait,Decreased activity tolerance,Decreased balance,Decreased endurance,Decreased knowledge of use of DME,Decreased mobility,Decreased skin integrity,Decreased strength,Increased edema,Postural dysfunction,Prosthetic Dependency  Visit Diagnosis: Unsteadiness on feet  Other abnormalities of gait and mobility  Muscle weakness (generalized)  Abnormal posture     Problem List Patient Active Problem List   Diagnosis Date Noted  . Maggot infestation 08/23/2019  . Osteomyelitis of foot, left, acute (HCC) 12/26/2018  . Right knee pain 10/04/2017  . Left hip pain 04/10/2013  . Diabetic peripheral neuropathy associated with type 2 diabetes mellitus (HCC) 11/10/2012  .  Marfan's syndrome 08/08/2012  . Atopic dermatitis 02/16/2012  . Overweight (BMI 25.0-29.9) 07/05/2011  . Allergic rhinitis due to allergen 07/03/2011  . Venous insufficiency of leg 04/03/2011  . Blindness 07/23/2010  . DM (diabetes mellitus), type 2 with neurological complications (HCC) 06/13/2010  . Hypertension 06/13/2010  .  Hyperlipidemia 06/13/2010    Vladimir Faster, PT, DPT 04/29/2020, 12:13 PM  [facility] [address] [place], [place], [address] Phone: [phone]   Fax:  [phone]  Name: JOESPH MARCY MRN: [id number] Date of Birth: 1951-02-12

## 2020-05-01 ENCOUNTER — Other Ambulatory Visit: Payer: Self-pay

## 2020-05-01 ENCOUNTER — Ambulatory Visit (INDEPENDENT_AMBULATORY_CARE_PROVIDER_SITE_OTHER): Payer: Medicare Other | Admitting: Physical Therapy

## 2020-05-01 ENCOUNTER — Encounter: Payer: Self-pay | Admitting: Physical Therapy

## 2020-05-01 ENCOUNTER — Telehealth: Payer: Self-pay | Admitting: Physical Therapy

## 2020-05-01 DIAGNOSIS — R2689 Other abnormalities of gait and mobility: Secondary | ICD-10-CM

## 2020-05-01 DIAGNOSIS — M6281 Muscle weakness (generalized): Secondary | ICD-10-CM

## 2020-05-01 DIAGNOSIS — R293 Abnormal posture: Secondary | ICD-10-CM

## 2020-05-01 DIAGNOSIS — R2681 Unsteadiness on feet: Secondary | ICD-10-CM

## 2020-05-01 NOTE — Telephone Encounter (Signed)
done

## 2020-05-01 NOTE — Telephone Encounter (Signed)
Mr. Doolittle' limb has shrunk. His limb measurements indicate size large Vivewear shrinker. He has an appointment at [facility] tomorrow. Can you please Valinda Hoar [phone] to [facility] prescription for large Vivewear shrinkers? Thank you  Zella Ball

## 2020-05-01 NOTE — Therapy (Signed)
[facility] Physical Therapy [address] [place], [place], [address] Phone: [phone]   Fax:  [phone]  Physical Therapy Treatment & 10th Visit Progress Note  Patient Details  Name: Chad Hall MRN: [id number] Date of Birth: February 07, 1951 Referring Provider (PT): West Bali Persons, [place]   Encounter Date: 05/01/2020  Progress Note Reporting Period 03/28/2020 to 05/01/2020  See note below for Objective Data and Assessment of Progress/Goals.        PT End of Session - 05/01/20 1256    Visit Number 10    Number of Visits 25    Date for PT Re-Evaluation 06/26/20    Authorization Type [employer] Medicare    Authorization Time Period 5% coinsurance    PT Start Time 1100    PT Stop Time 1147    PT Time Calculation (min) 47 min    Equipment Utilized During Treatment Gait belt    Activity Tolerance Patient tolerated treatment well    Behavior During Therapy WFL for tasks assessed/performed           Past Medical History:  Diagnosis Date  . Allergy   . Blindness - both eyes 1967   Basketball injury  . Diabetes mellitus   . Hernia   . Hyperlipidemia   . Hypertension   . Marfan syndrome     Past Surgical History:  Procedure Laterality Date  . AMPUTATION Left 12/28/2018   Procedure: LEFT FOOT 3RD RAY AMPUTATION;  Surgeon: Nadara Mustard, MD;  Location: [facility] OR;  Service: Orthopedics;  Laterality: Left;  . AMPUTATION Left 01/03/2020   Procedure: LEFT BELOW KNEE AMPUTATION;  Surgeon: Nadara Mustard, MD;  Location: [facility] OR;  Service: Orthopedics;  Laterality: Left;  . APPENDECTOMY    . INGUINAL HERNIA REPAIR     Right 1970s,    Left 2011 by Dr Andrey Campanile  . INGUINAL HERNIA REPAIR  08/25/10   recurrent left  . PENILE PROSTHESIS IMPLANT  2000s   [facility], Dr Brunilda Payor  . RETINAL DETACHMENT SURGERY  1970  . Rotator Cuff Repair  2009   Left side, Dr Elita Quick   . ROTATOR CUFF REPAIR  2010-2011?   right side    There were no vitals filed for this visit.    Subjective Assessment - 05/01/20 1104    Subjective He brought his Vivewear shrinker for PT as requested    Pertinent History Lt TTA, DM2, peripheral neuropathy, HTN, Marfan's syndrome blindness 1967    Patient Stated Goals walk with prosthesis like before which was no walking device & used service animal in community    Currently in Pain? No/denies                             [facility] Adult PT Treatment/Exercise - 05/01/20 1123      Transfers   Transfers Sit to Stand;Stand to Sit    Sit to Stand 5: Supervision;With upper extremity assist;With armrests;From chair/3-in-1;Other (comment)    Sit to Stand Details Verbal cues for sequencing;Verbal cues for technique    Stand to Sit 5: Supervision;With upper extremity assist;With armrests;To chair/3-in-1;Other (comment)    Stand to Sit Details (indicate cue type and reason) Tactile cues for sequencing;Verbal cues for technique      Ambulation/Gait   Ambulation/Gait Yes    Ambulation/Gait Assistance 5: Supervision    Ambulation Distance (Feet) 300 Feet   300' with dog & 37' X 2 inside with prosthesis only   Assistive  device Prosthesis;None;Other (Comment)   seeing eye dog for longer distances   Stairs --    Stairs Assistance --    Stair Management Technique --    Number of Stairs --    Height of Stairs --    Ramp 5: Supervision   curb cut outdoors with seeing eye dog & prosthesis   Curb 4: Min assist   outdoors with seeing eye dog & prosthesis     Prosthetics   Prosthetic Care Comments  PT measured thigh and calf of residual limb and should be in size Soil scientist.  PT demo & instructed in using cut off shrinker under liner.  PT spoke with Edman Circle, [facility] regarding appt tomorrow.    Current prosthetic wear tolerance (days/week)  daily    Current prosthetic wear tolerance (#hours/day)  5hrs on & 3-4hrs off.  second wear until bedtime    Edema no overt edema noted today    Residual limb condition  distal medial  tibia area with callous & wound 1cm with no signs of infection, no signs of infection but color change from increased moisture.    Education Provided Proper wear schedule/adjustment;Other (comment);Residual limb care;Correct ply sock adjustment   see prosthetic care comments   Person(s) Educated Patient    Education Method Explanation;Tactile cues;Verbal cues    Education Method Verbalized understanding;Tactile cues required;Verbal cues required;Needs further instruction                    PT Short Term Goals - 04/24/20 1201      PT SHORT TERM GOAL #1   Title Patient reports proper prosthetic care for sweat management & adjusting ply fit.    Time 4    Period Weeks    Status New    Target Date 05/23/20      PT SHORT TERM GOAL #2   Title Berg Balance >36/56    Time 4    Period Weeks    Status Revised    Target Date 05/23/20      PT SHORT TERM GOAL #3   Title Patient ambulates in simulated household environment with prosthesis only with supervision.    Time 4    Period Weeks    Status New    Target Date 05/23/20      PT SHORT TERM GOAL #4   Title Patient ambulates 400' with seeing eye dog & ambulatory cane with supervision.    Time 4    Period Weeks    Status Revised    Target Date 05/23/20      PT SHORT TERM GOAL #5   Title Patient negotiates ramps / curb cuts & curbs with seeing eye dog & ambulatory cane with supervision.    Time 4    Period Weeks    Status New    Target Date 05/23/20             PT Long Term Goals - 04/24/20 1205      PT LONG TERM GOAL #1   Title Patient verbalizes & demonstrates understanding of proper prosthetic care to enable utilization of proshesis.    Time 12    Period Weeks    Status On-going    Target Date 06/26/20      PT LONG TERM GOAL #2   Title Patient tolerates prosthesis wear >90% of awake hours without skin or limb discomfort issues to enable function throughout his day.    Time 12    Period Weeks  Status  On-going    Target Date 05/23/20      PT LONG TERM GOAL #3   Title Berg Balance >/= 45/56    Time 12    Period Weeks    Status On-going    Target Date 05/23/20      PT LONG TERM GOAL #4   Title Patient ambulates >500' with cane or less and seeing eye dog with prosthesis modified independent.    Time 12    Period Weeks    Status On-going    Target Date 05/23/20      PT LONG TERM GOAL #5   Title Patient negotiates ramps & curbs with cane or less and seeing eye dog with prosthesis modified independent.    Time 12    Period Weeks    Status On-going    Target Date 05/23/20                 Plan - 05/01/20 1256    Clinical Impression Statement Patient has improved prosthetic gait simulating household without device and community with seeing eye dog.  Patient appears to understand how to use cutoff Vivewear shrinker under liner to heal wound.    Personal Factors and Comorbidities Comorbidity 3+    Comorbidities Lt TTA, DM2, peripheral neuropathy, HTN, Marfan's syndrome blindness 1967    Examination-Activity Limitations Lift;Locomotion Level;Stairs;Stand;Transfers    Stability/Clinical Decision Making Evolving/Moderate complexity    Rehab Potential Good    PT Frequency 2x / week    PT Duration 12 weeks    PT Treatment/Interventions ADLs/Self Care Home Management;DME Instruction;Gait training;Stair training;Functional mobility training;Therapeutic activities;Therapeutic exercise;Balance training;Neuromuscular re-education;Patient/family education;Prosthetic Training    PT Next Visit Plan Do 10th visit progress note.  He has appt with prosthetist on 3/17 with recommendation to grind distolateral socket, add pretibial pads & check alignment.  prosthetic gait with ambulatory cane & visual cane.     Pt to bring his seeing eye dog to PT session to work on gait with dog.  continue education on prosthetic care.    Consulted and Agree with Plan of Care Patient           Patient will  benefit from skilled therapeutic intervention in order to improve the following deficits and impairments:  Abnormal gait,Decreased activity tolerance,Decreased balance,Decreased endurance,Decreased knowledge of use of DME,Decreased mobility,Decreased skin integrity,Decreased strength,Increased edema,Postural dysfunction,Prosthetic Dependency  Visit Diagnosis: Unsteadiness on feet  Other abnormalities of gait and mobility  Abnormal posture  Muscle weakness (generalized)     Problem List Patient Active Problem List   Diagnosis Date Noted  . Maggot infestation 08/23/2019  . Osteomyelitis of foot, left, acute (HCC) 12/26/2018  . Right knee pain 10/04/2017  . Left hip pain 04/10/2013  . Diabetic peripheral neuropathy associated with type 2 diabetes mellitus (HCC) 11/10/2012  . Marfan's syndrome 08/08/2012  . Atopic dermatitis 02/16/2012  . Overweight (BMI 25.0-29.9) 07/05/2011  . Allergic rhinitis due to allergen 07/03/2011  . Venous insufficiency of leg 04/03/2011  . Blindness 07/23/2010  . DM (diabetes mellitus), type 2 with neurological complications (HCC) 06/13/2010  . Hypertension 06/13/2010  . Hyperlipidemia 06/13/2010    Vladimir Faster PT, DPT 05/01/2020, 12:59 PM  [facility] Physical Therapy [address] [place], [place], [address] Phone: [phone]   Fax:  [phone]  Name: LANDY DUNNAVANT MRN: [id number] Date of Birth: [birth year]

## 2020-05-06 ENCOUNTER — Ambulatory Visit (INDEPENDENT_AMBULATORY_CARE_PROVIDER_SITE_OTHER): Payer: Medicare Other | Admitting: Physical Therapy

## 2020-05-06 ENCOUNTER — Other Ambulatory Visit: Payer: Self-pay

## 2020-05-06 ENCOUNTER — Encounter: Payer: Self-pay | Admitting: Physical Therapy

## 2020-05-06 DIAGNOSIS — M6281 Muscle weakness (generalized): Secondary | ICD-10-CM

## 2020-05-06 DIAGNOSIS — R2681 Unsteadiness on feet: Secondary | ICD-10-CM | POA: Diagnosis not present

## 2020-05-06 DIAGNOSIS — R293 Abnormal posture: Secondary | ICD-10-CM

## 2020-05-06 DIAGNOSIS — R2689 Other abnormalities of gait and mobility: Secondary | ICD-10-CM

## 2020-05-06 NOTE — Therapy (Signed)
[facility] Physical Therapy [address] [place], [place], [address] Phone: [phone]   Fax:  [phone]  Physical Therapy Treatment  Patient Details  Name: Chad Hall MRN: [id number] Date of Birth: [birth year] Referring Provider (PT): West Bali Persons, [place]   Encounter Date: 05/06/2020   PT End of Session - 05/06/20 1109    Visit Number 11    Number of Visits 25    Date for PT Re-Evaluation 06/26/20    Authorization Type [employer] Medicare    Authorization Time Period 5% coinsurance    PT Start Time 1100    PT Stop Time 1145    PT Time Calculation (min) 45 min    Equipment Utilized During Treatment Gait belt    Activity Tolerance Patient tolerated treatment well    Behavior During Therapy [facility] for tasks assessed/performed           Past Medical History:  Diagnosis Date  . Allergy   . Blindness - both eyes 1967   Basketball injury  . Diabetes mellitus   . Hernia   . Hyperlipidemia   . Hypertension   . Marfan syndrome     Past Surgical History:  Procedure Laterality Date  . AMPUTATION Left 12/28/2018   Procedure: LEFT FOOT 3RD RAY AMPUTATION;  Surgeon: Nadara Mustard, MD;  Location: [facility] OR;  Service: Orthopedics;  Laterality: Left;  . AMPUTATION Left 01/03/2020   Procedure: LEFT BELOW KNEE AMPUTATION;  Surgeon: Nadara Mustard, MD;  Location: [facility] OR;  Service: Orthopedics;  Laterality: Left;  . APPENDECTOMY    . INGUINAL HERNIA REPAIR     Right 1970s,    Left 2011 by Dr Andrey Campanile  . INGUINAL HERNIA REPAIR  08/25/10   recurrent left  . PENILE PROSTHESIS IMPLANT  2000s   [facility], Dr Brunilda Payor  . RETINAL DETACHMENT SURGERY  1970  . Rotator Cuff Repair  2009   Left side, Dr Elita Quick   . ROTATOR CUFF REPAIR  2010-2011?   right side    There were no vitals filed for this visit.   Subjective Assessment - 05/06/20 1100    Subjective He saw Cherre Blanc, [facility] on Thursday. The area felt better. She issued 3 new Vivewear shrinkers for current limb  size.  He has been using Vivewear cutoff shrinker under liner.    Pertinent History Lt TTA, DM2, peripheral neuropathy, HTN, Marfan's syndrome blindness 1967    Patient Stated Goals walk with prosthesis like before which was no walking device & used service animal in community    Currently in Pain? No/denies                             [facility] Adult PT Treatment/Exercise - 05/06/20 1100      Transfers   Transfers Sit to Stand;Stand to Sit    Sit to Stand 5: Supervision;With upper extremity assist;With armrests;From chair/3-in-1;Other (comment)    Sit to Stand Details Verbal cues for sequencing;Verbal cues for technique    Stand to Sit 5: Supervision;With upper extremity assist;With armrests;To chair/3-in-1;Other (comment)    Stand to Sit Details (indicate cue type and reason) Tactile cues for sequencing;Verbal cues for technique      Ambulation/Gait   Ambulation/Gait Yes    Ambulation/Gait Assistance 5: Supervision    Ambulation/Gait Assistance Details worked on entering/exiting business without w/c assistance.    Ambulation Distance (Feet) 300 Feet   300' X 2 with dog & 105' X  2 without device   Assistive device Prosthesis;None;Other (Comment)   seeing eye dog for longer distances   Ramp --    Curb --      Self-Care   Self-Care Other Self-Care Comments    Other Self-Care Comments  PT verbal cues on gettig in & out of vehicle in front & back seats. He verbalized & return demo understanding.  PT verbal & tactile cues getting in/out of middle seat of transport van with running board.  Pt verbalized understanding.      Prosthetics   Prosthetic Care Comments  Continue using cutoff Vivewear shrinker under liner.    Current prosthetic wear tolerance (days/week)  daily    Current prosthetic wear tolerance (#hours/day)  increase to most of awake hours.    Edema no overt edema noted today    Residual limb condition  distal medial tibia area with callous & wound 1cm with no  signs of infection & granulation over surface, no signs of infection but color change from increased moisture.    Education Provided Proper wear schedule/adjustment;Other (comment);Residual limb care;Correct ply sock adjustment   see prosthetic care comments   Person(s) Educated Patient    Education Method Explanation;Verbal cues    Education Method Verbalized understanding;Verbal cues required;Needs further instruction    Donning Prosthesis Modified independent (device/increased time)                    PT Short Term Goals - 04/24/20 1201      PT SHORT TERM GOAL #1   Title Patient reports proper prosthetic care for sweat management & adjusting ply fit.    Time 4    Period Weeks    Status New    Target Date 05/23/20      PT SHORT TERM GOAL #2   Title Berg Balance >36/56    Time 4    Period Weeks    Status Revised    Target Date 05/23/20      PT SHORT TERM GOAL #3   Title Patient ambulates in simulated household environment with prosthesis only with supervision.    Time 4    Period Weeks    Status New    Target Date 05/23/20      PT SHORT TERM GOAL #4   Title Patient ambulates 400' with seeing eye dog & ambulatory cane with supervision.    Time 4    Period Weeks    Status Revised    Target Date 05/23/20      PT SHORT TERM GOAL #5   Title Patient negotiates ramps / curb cuts & curbs with seeing eye dog & ambulatory cane with supervision.    Time 4    Period Weeks    Status New    Target Date 05/23/20             PT Long Term Goals - 04/24/20 1205      PT LONG TERM GOAL #1   Title Patient verbalizes & demonstrates understanding of proper prosthetic care to enable utilization of proshesis.    Time 12    Period Weeks    Status On-going    Target Date 06/26/20      PT LONG TERM GOAL #2   Title Patient tolerates prosthesis wear >90% of awake hours without skin or limb discomfort issues to enable function throughout his day.    Time 12    Period Weeks     Status On-going    Target Date  05/23/20      PT LONG TERM GOAL #3   Title Berg Balance >/= 45/56    Time 12    Period Weeks    Status On-going    Target Date 05/23/20      PT LONG TERM GOAL #4   Title Patient ambulates >500' with cane or less and seeing eye dog with prosthesis modified independent.    Time 12    Period Weeks    Status On-going    Target Date 05/23/20      PT LONG TERM GOAL #5   Title Patient negotiates ramps & curbs with cane or less and seeing eye dog with prosthesis modified independent.    Time 12    Period Weeks    Status On-going    Target Date 05/23/20                 Plan - 05/06/20 1109    Clinical Impression Statement PT instructed in getting in & out of vehicles with TTA prosthesis and he appears to understand.  PT worked on carrying cup of water walking in simulated home situation without issues.    Personal Factors and Comorbidities Comorbidity 3+    Comorbidities Lt TTA, DM2, peripheral neuropathy, HTN, Marfan's syndrome blindness 1967    Examination-Activity Limitations Lift;Locomotion Level;Stairs;Stand;Transfers    Stability/Clinical Decision Making Evolving/Moderate complexity    Rehab Potential Good    PT Frequency 2x / week    PT Duration 12 weeks    PT Treatment/Interventions ADLs/Self Care Home Management;DME Instruction;Gait training;Stair training;Functional mobility training;Therapeutic activities;Therapeutic exercise;Balance training;Neuromuscular re-education;Patient/family education;Prosthetic Training    PT Next Visit Plan prosthetic gait with ambulatory cane & visual cane.     Pt to bring his seeing eye dog to PT session to work on gait with dog.  continue education on prosthetic care.    Consulted and Agree with Plan of Care Patient           Patient will benefit from skilled therapeutic intervention in order to improve the following deficits and impairments:  Abnormal gait,Decreased activity tolerance,Decreased  balance,Decreased endurance,Decreased knowledge of use of DME,Decreased mobility,Decreased skin integrity,Decreased strength,Increased edema,Postural dysfunction,Prosthetic Dependency  Visit Diagnosis: Unsteadiness on feet  Other abnormalities of gait and mobility  Abnormal posture  Muscle weakness (generalized)     Problem List Patient Active Problem List   Diagnosis Date Noted  . Maggot infestation 08/23/2019  . Osteomyelitis of foot, left, acute (HCC) 12/26/2018  . Right knee pain 10/04/2017  . Left hip pain 04/10/2013  . Diabetic peripheral neuropathy associated with type 2 diabetes mellitus (HCC) 11/10/2012  . Marfan's syndrome 08/08/2012  . Atopic dermatitis 02/16/2012  . Overweight (BMI 25.0-29.9) 07/05/2011  . Allergic rhinitis due to allergen 07/03/2011  . Venous insufficiency of leg 04/03/2011  . Blindness 07/23/2010  . DM (diabetes mellitus), type 2 with neurological complications (HCC) 06/13/2010  . Hypertension 06/13/2010  . Hyperlipidemia 06/13/2010    Vladimir Faster, PT, DPT 05/06/2020, 12:21 PM  [facility] Physical Therapy [address] [place], [place], [address] Phone: [phone]   Fax:  [phone]  Name: ROBERTO ROMANOSKI MRN: [id number] Date of Birth: 02/21/1950

## 2020-05-08 ENCOUNTER — Other Ambulatory Visit: Payer: Self-pay

## 2020-05-08 ENCOUNTER — Ambulatory Visit (INDEPENDENT_AMBULATORY_CARE_PROVIDER_SITE_OTHER): Payer: Medicare Other | Admitting: Physical Therapy

## 2020-05-08 ENCOUNTER — Encounter: Payer: Self-pay | Admitting: Physical Therapy

## 2020-05-08 DIAGNOSIS — R2689 Other abnormalities of gait and mobility: Secondary | ICD-10-CM

## 2020-05-08 DIAGNOSIS — R2681 Unsteadiness on feet: Secondary | ICD-10-CM

## 2020-05-08 DIAGNOSIS — M6281 Muscle weakness (generalized): Secondary | ICD-10-CM

## 2020-05-08 DIAGNOSIS — R293 Abnormal posture: Secondary | ICD-10-CM | POA: Diagnosis not present

## 2020-05-08 NOTE — Therapy (Signed)
[facility] [address] [place], [place], [address] Phone: [phone]   Fax:  [phone]  Physical Therapy Treatment  Patient Details  Name: Chad Hall MRN: [id number] Date of Birth: 19-Jan-1951 Referring Provider (PT): [place] Anne Persons, [place]   Encounter Date: 05/08/2020   PT End of Session - 05/08/20 1103    Visit Number 12    Number of Visits 25    Date for PT Re-Evaluation 06/26/20    Authorization Type UHC Medicare    Authorization Time Period 5% coinsurance    PT Start Time 1100    PT Stop Time 1141    PT Time Calculation (min) 41 min    Equipment Utilized During Treatment Gait belt    Activity Tolerance Patient tolerated treatment well    Behavior [facility] for [facility] assessed/performed           Past Medical History:  Diagnosis Date  . Allergy   . Blindness - both eyes 1967   Basketball injury  . Diabetes mellitus   . Hernia   . Hyperlipidemia   . Hypertension   . Marfan syndrome     Past Surgical History:  Procedure Laterality Date  . AMPUTATION Left 12/28/2018   Procedure: LEFT FOOT 3RD RAY AMPUTATION;  Surgeon: Nadara Mustarduda, Marcus V, MD;  [facility];  Service: Orthopedics;  Laterality: Left;  . AMPUTATION Left 01/03/2020   Procedure: LEFT BELOW KNEE AMPUTATION;  Surgeon: Nadara Mustarduda, Marcus V, MD;  [facility];  Service: Orthopedics;  Laterality: Left;  . APPENDECTOMY    . INGUINAL HERNIA REPAIR     Right 1970s,    Left 2011 by Dr Andrey CampanileWilson  . INGUINAL HERNIA REPAIR  08/25/10   recurrent left  . PENILE PROSTHESIS IMPLANT  2000s   [facility], Dr Brunilda PayorNesi  . RETINAL DETACHMENT SURGERY  1970  . Rotator Cuff Repair  2009   Left side, Dr Elita Quickowen   . ROTATOR CUFF REPAIR  2010-2011?   right side    There were no vitals filed for this visit.   Subjective Assessment - 05/08/20 1104    Subjective He is concerned that spot on leg is getting bigger.    Pertinent History Lt TTA, DM2, peripheral neuropathy, HTN,  Marfan's syndrome blindness 1967    Patient Stated Goals walk with prosthesis like before which was no walking device & used service animal in community    Currently in Pain? No/denies                             [facility] - 05/08/20 1100      Transfers   Transfers Sit to Stand;Stand to Sit    Sit to Stand 5: Supervision;With upper extremity assist;With armrests;From chair/3-in-1;Other (comment)    Sit to Stand Details Verbal cues for sequencing;Verbal cues for technique    Stand to Sit 5: Supervision;With upper extremity assist;With armrests;To chair/3-in-1;Other (comment)    Stand to Sit Details (indicate cue type and reason) Tactile cues for sequencing;Verbal cues for technique      Ambulation/Gait   Ambulation/Gait Yes    Ambulation/Gait Assistance 5: Supervision    Ambulation/Gait Assistance Details simulating walking in home - using wall / counter to find chair in rooom    Ambulation Distance (Feet) 50 Feet   50' X 2 without device   Assistive device Prosthesis;None;Other (Comment)   seeing eye dog for longer distances  High Level Balance   High Level Balance Activities Side stepping;Braiding;Backward walking;Turns;Tandem walking    High Level Balance Comments In //bars with intermittent UE except crossovers required BUE support.  Verbal cues on technique.      Self-Care   Self-Care --    Other Self-Care Comments  --      Neuro Re-ed    Neuro Re-ed Details  standing crossways on foam balance beam with head turns right/left & up/down with manual assist for balance reactions.  LLE on foam beam stepping RLE over back & forth.      Knee/Hip Exercises: Standing   Forward Step Up Right;Left;1 set;5 reps;Hand Hold: 1;Step Height: 6"    Forward Step Up Limitations verbal cues on technique & using RLE to feel step & step prosthesis close to step prior to stepping up    Step Down Left;Right;1 set;5 reps;Step Height: 6";Hand Hold: 1    Step  Down Limitations verbal cues on technique including feeling edge of step with RLE prior to stepping down    Rocker Board 1 minute;Other (comment)   ant/level/post & right/mid/left, BUEs support, square board with 2 pivot points   Rocker Board Limitations tactile & verbal cues      Prosthetics   Prosthetic Care Comments  Continue using cutoff Vivewear shrinker under liner.    Current prosthetic wear tolerance (days/week)  daily    Current prosthetic wear tolerance (#hours/day)  increase to most of awake hours.    Edema no overt edema noted today    Residual limb condition  distal medial tibia area with callous & wound 0.7cm wide X 1cm tall with no signs of infection & granulation over surface, pt notes no increased tenderness to wound or surrounding area    Education Provided Other (comment);Residual limb care;Skin check   see prosthetic care comments   Person(s) Educated Patient    Education Method Explanation;Verbal cues    Education Method Verbalized understanding;Verbal cues required;Needs further instruction    Donning Prosthesis Modified independent (device/increased time)                    PT Short Term Goals - 04/24/20 1201      PT SHORT TERM GOAL #1   Title Patient reports proper prosthetic care for sweat management & adjusting ply fit.    Time 4    Period Weeks    Status New    Target Date 05/23/20      PT SHORT TERM GOAL #2   Title Berg Balance >36/56    Time 4    Period Weeks    Status Revised    Target Date 05/23/20      PT SHORT TERM GOAL #3   Title Patient ambulates in simulated household environment with prosthesis only with supervision.    Time 4    Period Weeks    Status New    Target Date 05/23/20      PT SHORT TERM GOAL #4   Title Patient ambulates 400' with seeing eye dog & ambulatory cane with supervision.    Time 4    Period Weeks    Status Revised    Target Date 05/23/20      PT SHORT TERM GOAL #5   Title Patient negotiates ramps /  curb cuts & curbs with seeing eye dog & ambulatory cane with supervision.    Time 4    Period Weeks    Status New    Target Date 05/23/20  PT Long Term Goals - 04/24/20 1205      PT LONG TERM GOAL #1   Title Patient verbalizes & demonstrates understanding of proper prosthetic care to enable utilization of proshesis.    Time 12    Period Weeks    Status On-going    Target Date 06/26/20      PT LONG TERM GOAL #2   Title Patient tolerates prosthesis wear >90% of awake hours without skin or limb discomfort issues to enable function throughout his day.    Time 12    Period Weeks    Status On-going    Target Date 05/23/20      PT LONG TERM GOAL #3   Title Berg Balance >/= 45/56    Time 12    Period Weeks    Status On-going    Target Date 05/23/20      PT LONG TERM GOAL #4   Title Patient ambulates >500' with cane or less and seeing eye dog with prosthesis modified independent.    Time 12    Period Weeks    Status On-going    Target Date 05/23/20      PT LONG TERM GOAL #5   Title Patient negotiates ramps & curbs with cane or less and seeing eye dog with prosthesis modified independent.    Time 12    Period Weeks    Status On-going    Target Date 05/23/20                 Plan - 05/08/20 1104    Clinical Impression Statement PT session worked on balance and improved reactions and movements in various directions with instruction & repetition. His wound appears to be healing with use of cutoff Vivewear shrinker under liner.    Personal Factors and Comorbidities Comorbidity 3+    Comorbidities Lt TTA, DM2, peripheral neuropathy, HTN, Marfan's syndrome blindness 1967    Examination-Activity Limitations Lift;Locomotion Level;Stairs;Stand;Transfers    Stability/Clinical Decision Making Evolving/Moderate complexity    Rehab Potential Good    PT Frequency 2x / week    PT Duration 12 weeks    PT Treatment/Interventions ADLs/Self Care Home Management;DME  Instruction;Gait training;Stair training;Functional mobility training;Therapeutic activities;Therapeutic exercise;Balance training;Neuromuscular re-education;Patient/family education;Prosthetic Training    PT Next Visit Plan balance activities. prosthetic gait with ambulatory cane & visual cane.     Pt to bring his seeing eye dog to PT session to work on gait with dog.  continue education on prosthetic care.    Consulted and Agree with Plan of Care Patient           Patient will benefit from skilled therapeutic intervention in order to improve the following deficits and impairments:  Abnormal gait,Decreased activity tolerance,Decreased balance,Decreased endurance,Decreased knowledge of use of DME,Decreased mobility,Decreased skin integrity,Decreased strength,Increased edema,Postural dysfunction,Prosthetic Dependency  Visit Diagnosis: Unsteadiness on feet  Other abnormalities of gait and mobility  Abnormal posture  Muscle weakness (generalized)     Problem List Patient Active Problem List   Diagnosis Date Noted  . Maggot infestation 08/23/2019  . Osteomyelitis of foot, left, acute (HCC) 12/26/2018  . Right knee pain 10/04/2017  . Left hip pain 04/10/2013  . Diabetic peripheral neuropathy associated with type 2 diabetes mellitus (HCC) 11/10/2012  . Marfan's syndrome 08/08/2012  . Atopic dermatitis 02/16/2012  . Overweight (BMI 25.0-29.9) 07/05/2011  . Allergic rhinitis due to allergen 07/03/2011  . Venous insufficiency of leg 04/03/2011  . Blindness 07/23/2010  . DM (diabetes mellitus), type 2 with  neurological complications (HCC) 06/13/2010  . Hypertension 06/13/2010  . Hyperlipidemia 06/13/2010    Vladimir Faster, PT, DPT 05/08/2020, 12:32 PM  [facility] Physical Therapy [address] [place], [place], [address] Phone: [phone]   Fax:  [phone]  Name: Chad Hall MRN: [id number] Date of Birth: 03-15-1950

## 2020-05-13 ENCOUNTER — Other Ambulatory Visit: Payer: Self-pay | Admitting: Family Medicine

## 2020-05-13 ENCOUNTER — Ambulatory Visit (INDEPENDENT_AMBULATORY_CARE_PROVIDER_SITE_OTHER): Payer: Medicare Other | Admitting: Physical Therapy

## 2020-05-13 ENCOUNTER — Encounter: Payer: Self-pay | Admitting: Physical Therapy

## 2020-05-13 ENCOUNTER — Other Ambulatory Visit: Payer: Self-pay

## 2020-05-13 DIAGNOSIS — R2681 Unsteadiness on feet: Secondary | ICD-10-CM

## 2020-05-13 DIAGNOSIS — M6281 Muscle weakness (generalized): Secondary | ICD-10-CM | POA: Diagnosis not present

## 2020-05-13 DIAGNOSIS — R293 Abnormal posture: Secondary | ICD-10-CM

## 2020-05-13 DIAGNOSIS — R2689 Other abnormalities of gait and mobility: Secondary | ICD-10-CM | POA: Diagnosis not present

## 2020-05-13 NOTE — Therapy (Signed)
[facility] [address] [place], [place], [address] Phone: [phone]   Fax:  [phone]  Physical Therapy Treatment  Patient Details  Name: Chad Hall MRN: [id number] Date of Birth: Dec 07, 1950 Referring Provider (PT): [place] Anne Persons, [place]   Encounter Date: 05/13/2020   PT End of Session - 05/13/20 1100    Visit Number 13    Number of Visits 25    Date for PT Re-Evaluation 06/26/20    Authorization Type UHC Medicare    Authorization Time Period 5% coinsurance    PT Start Time 1100    PT Stop Time 1142    PT Time Calculation (min) 42 min    Equipment Utilized During Treatment Gait belt    Activity Tolerance Patient tolerated treatment well    Behavior During Therapy [facility] for tasks assessed/performed           Past Medical History:  Diagnosis Date  . Allergy   . Blindness - both eyes 1967   Basketball injury  . Diabetes mellitus   . Hernia   . Hyperlipidemia   . Hypertension   . Marfan syndrome     Past Surgical History:  Procedure Laterality Date  . AMPUTATION Left 12/28/2018   Procedure: LEFT FOOT 3RD RAY AMPUTATION;  Surgeon: Nadara Mustarduda, Marcus V, MD;  [facility];  Service: Orthopedics;  Laterality: Left;  . AMPUTATION Left 01/03/2020   Procedure: LEFT BELOW KNEE AMPUTATION;  Surgeon: Nadara Mustarduda, Marcus V, MD;  [facility];  Service: Orthopedics;  Laterality: Left;  . APPENDECTOMY    . INGUINAL HERNIA REPAIR     Right 1970s,    Left 2011 by Dr Andrey CampanileWilson  . INGUINAL HERNIA REPAIR  08/25/10   recurrent left  . PENILE PROSTHESIS IMPLANT  2000s   [facility], Dr Brunilda PayorNesi  . RETINAL DETACHMENT SURGERY  1970  . Rotator Cuff Repair  2009   Left side, Dr Elita Quickowen   . ROTATOR CUFF REPAIR  2010-2011?   right side    There were no vitals filed for this visit.   Subjective Assessment - 05/13/20 1106    Subjective He has been wearing prosthesis most of awake hours again. If sitting around house, sometimes he'll pull prosthesis  off & leaves liner on limb.    Pertinent History Lt TTA, DM2, peripheral neuropathy, HTN, Marfan's syndrome blindness 1967    Patient Stated Goals walk with prosthesis like before which was no walking device & used service animal in community    Currently in Pain? No/denies                             [facility] - 05/13/20 1100      Transfers   Transfers Sit to Stand;Stand to Sit    Sit to Stand 6: Modified independent (Device/Increase time);With upper extremity assist;With armrests;From chair/3-in-1;Other (comment)   chairs with & without armrests   Sit to Stand Details Verbal cues for sequencing;Verbal cues for technique    Stand to Sit 6: Modified independent (Device/Increase time);With upper extremity assist;With armrests;To chair/3-in-1;Other (comment)   chairs with & without armrests   Stand to Sit Details (indicate cue type and reason) Tactile cues for sequencing;Verbal cues for technique    Transfer via Lift Equipment --      Ambulation/Gait   Ambulation/Gait Yes    Ambulation/Gait Assistance 5: Supervision    Ambulation/Gait Assistance Details indoors with prosthesis only using sound  and feeling walls (technique for the blind) and outdoors with seeing eye dog & no assistive device except prosthesis.    Ambulation Distance (Feet) 300 Feet   300' X 2 with dog & 97' X 2 without device   Assistive device Prosthesis;None;Other (Comment)   seeing eye dog for longer distances   Ambulation Surface Level;Indoor;Outdoor;Paved    Stairs Yes    Stairs Assistance 5: Supervision    Stairs Assistance Details (indicate cue type and reason) verbal cues on sequence    Stair Management Technique One rail Right;Forwards;Step to pattern    Number of Stairs 6    Height of Stairs 6    Ramp 5: Supervision   prosthesis only, seeing eye dog   Curb 5: Supervision   prosthesis only, seeing eye dog   Curb Details (indicate cue type and reason) verbal cues on  sequence      High Level Balance   High Level Balance Activities Side stepping;Braiding;Backward walking   in //bars   High Level Balance Comments sideways & backwards with //bars close but did not have to touch, PT supervision.   Braiding with BUE support on bar.      Self-Care   Self-Care --    Other Self-Care Comments  --      Prosthetics   Prosthetic Care Comments  Continue using cutoff Vivewear shrinker under liner. check shrinker every 4 hrs for wetness from sweating & switch to another cutoff shrinker if damp.    Current prosthetic wear tolerance (days/week)  daily    Current prosthetic wear tolerance (#hours/day)  increase to most of awake hours.    Edema no overt edema noted today    Residual limb condition  distal medial tibia area with callous & wound 1cm with no signs of infection & granulation over surface, no signs of infection but color change from increased moisture.    Education Provided Proper wear schedule/adjustment;Other (comment);Residual limb care;Correct ply sock adjustment   see prosthetic care comments   Person(s) Educated Patient    Education Method Explanation;Verbal cues    Education Method Verbalized understanding;Verbal cues required;Needs further instruction    Donning Prosthesis Modified independent (device/increased time)                    PT Short Term Goals - 04/24/20 1201      PT SHORT TERM GOAL #1   Title Patient reports proper prosthetic care for sweat management & adjusting ply fit.    Time 4    Period Weeks    Status New    Target Date 05/23/20      PT SHORT TERM GOAL #2   Title Berg Balance >36/56    Time 4    Period Weeks    Status Revised    Target Date 05/23/20      PT SHORT TERM GOAL #3   Title Patient ambulates in simulated household environment with prosthesis only with supervision.    Time 4    Period Weeks    Status New    Target Date 05/23/20      PT SHORT TERM GOAL #4   Title Patient ambulates 400' with  seeing eye dog & ambulatory cane with supervision.    Time 4    Period Weeks    Status Revised    Target Date 05/23/20      PT SHORT TERM GOAL #5   Title Patient negotiates ramps / curb cuts & curbs with seeing eye dog &  ambulatory cane with supervision.    Time 4    Period Weeks    Status New    Target Date 05/23/20             PT Long Term Goals - 04/24/20 1205      PT LONG TERM GOAL #1   Title Patient verbalizes & demonstrates understanding of proper prosthetic care to enable utilization of proshesis.    Time 12    Period Weeks    Status On-going    Target Date 06/26/20      PT LONG TERM GOAL #2   Title Patient tolerates prosthesis wear >90% of awake hours without skin or limb discomfort issues to enable function throughout his day.    Time 12    Period Weeks    Status On-going    Target Date 05/23/20      PT LONG TERM GOAL #3   Title Berg Balance >/= 45/56    Time 12    Period Weeks    Status On-going    Target Date 05/23/20      PT LONG TERM GOAL #4   Title Patient ambulates >500' with cane or less and seeing eye dog with prosthesis modified independent.    Time 12    Period Weeks    Status On-going    Target Date 05/23/20      PT LONG TERM GOAL #5   Title Patient negotiates ramps & curbs with cane or less and seeing eye dog with prosthesis modified independent.    Time 12    Period Weeks    Status On-going    Target Date 05/23/20                 Plan - 05/13/20 1105    Clinical Impression Statement Patient is on target to meet STGs & LTGs.  His wound appears the same as last session. PT advised to check shrinker for sweating & switch out if damp. He appears to understand.    Personal Factors and Comorbidities Comorbidity 3+    Comorbidities Lt TTA, DM2, peripheral neuropathy, HTN, Marfan's syndrome blindness 1967    Examination-Activity Limitations Lift;Locomotion Level;Stairs;Stand;Transfers    Stability/Clinical Decision Making  Evolving/Moderate complexity    Rehab Potential Good    PT Frequency 2x / week    PT Duration 12 weeks    PT Treatment/Interventions ADLs/Self Care Home Management;DME Instruction;Gait training;Stair training;Functional mobility training;Therapeutic activities;Therapeutic exercise;Balance training;Neuromuscular re-education;Patient/family education;Prosthetic Training    PT Next Visit Plan balance activities. prosthetic gait with ambulatory cane & visual cane.     Pt to bring his seeing eye dog to PT session to work on gait with dog.  continue education on prosthetic care.    Consulted and Agree with Plan of Care Patient           Patient will benefit from skilled therapeutic intervention in order to improve the following deficits and impairments:  Abnormal gait,Decreased activity tolerance,Decreased balance,Decreased endurance,Decreased knowledge of use of DME,Decreased mobility,Decreased skin integrity,Decreased strength,Increased edema,Postural dysfunction,Prosthetic Dependency  Visit Diagnosis: Unsteadiness on feet  Other abnormalities of gait and mobility  Abnormal posture  Muscle weakness (generalized)     Problem List Patient Active Problem List   Diagnosis Date Noted  . Maggot infestation 08/23/2019  . Osteomyelitis of foot, left, acute (HCC) 12/26/2018  . Right knee pain 10/04/2017  . Left hip pain 04/10/2013  . Diabetic peripheral neuropathy associated with type 2 diabetes mellitus (HCC) 11/10/2012  . Marfan's  syndrome 08/08/2012  . Atopic dermatitis 02/16/2012  . Overweight (BMI 25.0-29.9) 07/05/2011  . Allergic rhinitis due to allergen 07/03/2011  . Venous insufficiency of leg 04/03/2011  . Blindness 07/23/2010  . DM (diabetes mellitus), type 2 with neurological complications (HCC) 06/13/2010  . Hypertension 06/13/2010  . Hyperlipidemia 06/13/2010    Vladimir Faster, PT, DPT 05/13/2020, 11:57 AM  [facility] Physical Therapy [address] [place], [place], [address] Phone: [phone]   Fax:  [phone]  Name: Chad Hall MRN: [id number] Date of Birth: Apr 08, 1950

## 2020-05-15 ENCOUNTER — Encounter: Payer: Self-pay | Admitting: Physical Therapy

## 2020-05-15 ENCOUNTER — Ambulatory Visit (INDEPENDENT_AMBULATORY_CARE_PROVIDER_SITE_OTHER): Payer: Medicare Other | Admitting: Physical Therapy

## 2020-05-15 ENCOUNTER — Other Ambulatory Visit: Payer: Self-pay

## 2020-05-15 DIAGNOSIS — R293 Abnormal posture: Secondary | ICD-10-CM

## 2020-05-15 DIAGNOSIS — M6281 Muscle weakness (generalized): Secondary | ICD-10-CM | POA: Diagnosis not present

## 2020-05-15 DIAGNOSIS — R2689 Other abnormalities of gait and mobility: Secondary | ICD-10-CM | POA: Diagnosis not present

## 2020-05-15 DIAGNOSIS — R2681 Unsteadiness on feet: Secondary | ICD-10-CM

## 2020-05-15 NOTE — Therapy (Signed)
[facility] Physical Therapy [address] [place], [place], [address] Phone: [phone]   Fax:  [phone]  Physical Therapy Treatment  Patient Details  Name: Chad Hall MRN: [id number] Date of Birth: January 12, 1951 Referring Provider (PT): [place] Persons, [place]   Encounter Date: 05/15/2020   PT End of Session - 05/15/20 1107    Visit Number 14    Number of Visits 25    Date for PT Re-Evaluation 06/26/20    Authorization Type UHC Medicare    Authorization Time Period 5% coinsurance    PT Start Time 1100    PT Stop Time 1142    PT Time Calculation (min) 42 min    Equipment Utilized During Treatment Gait belt    Activity Tolerance Patient tolerated treatment well    Behavior During Therapy [facility] for tasks assessed/performed           Past Medical History:  Diagnosis Date  . Allergy   . Blindness - both eyes 1967   Basketball injury  . Diabetes mellitus   . Hernia   . Hyperlipidemia   . Hypertension   . Marfan syndrome     Past Surgical History:  Procedure Laterality Date  . AMPUTATION Left 12/28/2018   Procedure: LEFT FOOT 3RD RAY AMPUTATION;  Surgeon: Nadara Mustard, MD;  Location: [facility] OR;  Service: Orthopedics;  Laterality: Left;  . AMPUTATION Left 01/03/2020   Procedure: LEFT BELOW KNEE AMPUTATION;  Surgeon: Nadara Mustard, MD;  Location: [facility] OR;  Service: Orthopedics;  Laterality: Left;  . APPENDECTOMY    . INGUINAL HERNIA REPAIR     Right 1970s,    Left 2011 by Dr Andrey Campanile  . INGUINAL HERNIA REPAIR  08/25/10   recurrent left  . PENILE PROSTHESIS IMPLANT  2000s   [facility], Dr Brunilda Payor  . RETINAL DETACHMENT SURGERY  1970  . Rotator Cuff Repair  2009   Left side, Dr Elita Quick   . ROTATOR CUFF REPAIR  2010-2011?   right side    There were no vitals filed for this visit.   Subjective Assessment - 05/15/20 1100    Subjective He checked shrinker yesterday for dampness as PT recommended. He gets up ~1:45am and goes to bed 7-8:00pm.  He had  prosthesis on limb for 8 hrs & had not checked shrinker. (it was damp)    Pertinent History Lt TTA, DM2, peripheral neuropathy, HTN, Marfan's syndrome blindness 1967    Patient Stated Goals walk with prosthesis like before which was no walking device & used service animal in community    Currently in Pain? No/denies                             [facility] Adult PT Treatment/Exercise - 05/15/20 1100      Transfers   Transfers Sit to Stand;Stand to Sit    Sit to Stand 6: Modified independent (Device/Increase time);With upper extremity assist;With armrests;From chair/3-in-1;Other (comment)   chairs with & without armrests   Sit to Stand Details Verbal cues for sequencing;Verbal cues for technique    Stand to Sit 6: Modified independent (Device/Increase time);With upper extremity assist;With armrests;To chair/3-in-1;Other (comment)   chairs with & without armrests   Stand to Sit Details (indicate cue type and reason) Tactile cues for sequencing;Verbal cues for technique      Ambulation/Gait   Ambulation/Gait Yes    Ambulation/Gait Assistance 5: Supervision    Ambulation Distance (Feet) 300 Feet  300' X 2 with dog & 49' X 2 without device   Assistive device Prosthesis;None;Other (Comment)   seeing eye dog for longer distances   Stairs --    Stairs Assistance --    Stair Management Technique --    Number of Stairs --    Height of Stairs --    Ramp 5: Supervision   prosthesis only, seeing eye dog   Curb 5: Supervision   prosthesis only, seeing eye dog     High Level Balance   High Level Balance Activities --    High Level Balance Comments --      Neuro Re-ed    Neuro Re-ed Details  standing alternating UEs & BUEs rows & forward punch 10 reps ea green theraband.  tactile cues for upright posture & balance      Knee/Hip Exercises: Standing   Hip Flexion Stengthening;Right;Left;1 set;10 reps;Knee straight   red theraband   Hip Flexion Limitations PT verbal & tactile cues for  balance reactions    Hip ADduction Strengthening;Right;Left;1 set;10 reps   red theraband   Hip ADduction Limitations PT verbal & tactile cues for balance reactions    Hip Abduction Stengthening;Right;Left;1 set;10 reps;Knee straight   red theraband   Abduction Limitations PT verbal & tactile cues for balance reactions    Hip Extension Stengthening;Right;Left;1 set;10 reps;Knee straight   red theraband   Extension Limitations PT verbal & tactile cues for balance reactions      Prosthetics   Prosthetic Care Comments  Continue using cutoff Vivewear shrinker under liner. check shrinker every 4-5 hrs for wetness from sweating & switch to another cutoff shrinker if damp.    Current prosthetic wear tolerance (days/week)  daily    Current prosthetic wear tolerance (#hours/day)  increase to most of awake hours.    Edema no overt edema noted today    Residual limb condition  distal medial tibia area with callous & wound 0.7 cm with no signs of infection & granulation over surface, edges are white from too much moisture, no signs of infection but color change from increased moisture.    Education Provided Proper wear schedule/adjustment;Other (comment);Residual limb care;Correct ply sock adjustment   see prosthetic care comments   Person(s) Educated Patient    Education Method Explanation;Verbal cues    Education Method Verbalized understanding;Verbal cues required;Needs further instruction                    PT Short Term Goals - 04/24/20 1201      PT SHORT TERM GOAL #1   Title Patient reports proper prosthetic care for sweat management & adjusting ply fit.    Time 4    Period Weeks    Status New    Target Date 05/23/20      PT SHORT TERM GOAL #2   Title Berg Balance >36/56    Time 4    Period Weeks    Status Revised    Target Date 05/23/20      PT SHORT TERM GOAL #3   Title Patient ambulates in simulated household environment with prosthesis only with supervision.    Time 4     Period Weeks    Status New    Target Date 05/23/20      PT SHORT TERM GOAL #4   Title Patient ambulates 400' with seeing eye dog & ambulatory cane with supervision.    Time 4    Period Weeks    Status Revised  Target Date 05/23/20      PT SHORT TERM GOAL #5   Title Patient negotiates ramps / curb cuts & curbs with seeing eye dog & ambulatory cane with supervision.    Time 4    Period Weeks    Status New    Target Date 05/23/20             PT Long Term Goals - 04/24/20 1205      PT LONG TERM GOAL #1   Title Patient verbalizes & demonstrates understanding of proper prosthetic care to enable utilization of proshesis.    Time 12    Period Weeks    Status On-going    Target Date 06/26/20      PT LONG TERM GOAL #2   Title Patient tolerates prosthesis wear >90% of awake hours without skin or limb discomfort issues to enable function throughout his day.    Time 12    Period Weeks    Status On-going    Target Date 05/23/20      PT LONG TERM GOAL #3   Title Berg Balance >/= 45/56    Time 12    Period Weeks    Status On-going    Target Date 05/23/20      PT LONG TERM GOAL #4   Title Patient ambulates >500' with cane or less and seeing eye dog with prosthesis modified independent.    Time 12    Period Weeks    Status On-going    Target Date 05/23/20      PT LONG TERM GOAL #5   Title Patient negotiates ramps & curbs with cane or less and seeing eye dog with prosthesis modified independent.    Time 12    Period Weeks    Status On-going    Target Date 05/23/20                 Plan - 05/15/20 1108    Clinical Impression Statement Patient is on target to meet STGs next week. His wound appears to be healing slowly. He needs to switch shrinkers under liner when damp as edges show signs of too much moisture.    Personal Factors and Comorbidities Comorbidity 3+    Comorbidities Lt TTA, DM2, peripheral neuropathy, HTN, Marfan's syndrome blindness 1967     Examination-Activity Limitations Lift;Locomotion Level;Stairs;Stand;Transfers    Stability/Clinical Decision Making Evolving/Moderate complexity    Rehab Potential Good    PT Frequency 2x / week    PT Duration 12 weeks    PT Treatment/Interventions ADLs/Self Care Home Management;DME Instruction;Gait training;Stair training;Functional mobility training;Therapeutic activities;Therapeutic exercise;Balance training;Neuromuscular re-education;Patient/family education;Prosthetic Training    PT Next Visit Plan balance activities. prosthetic gait with ambulatory cane & visual cane.     Pt to bring his seeing eye dog to PT session to work on gait with dog.  continue education on prosthetic care.    Consulted and Agree with Plan of Care Patient           Patient will benefit from skilled therapeutic intervention in order to improve the following deficits and impairments:  Abnormal gait,Decreased activity tolerance,Decreased balance,Decreased endurance,Decreased knowledge of use of DME,Decreased mobility,Decreased skin integrity,Decreased strength,Increased edema,Postural dysfunction,Prosthetic Dependency  Visit Diagnosis: Unsteadiness on feet  Other abnormalities of gait and mobility  Abnormal posture  Muscle weakness (generalized)     Problem List Patient Active Problem List   Diagnosis Date Noted  . Maggot infestation 08/23/2019  . Osteomyelitis of foot, left, acute (HCC) 12/26/2018  .  Right knee pain 10/04/2017  . Left hip pain 04/10/2013  . Diabetic peripheral neuropathy associated with type 2 diabetes mellitus (HCC) 11/10/2012  . Marfan's syndrome 08/08/2012  . Atopic dermatitis 02/16/2012  . Overweight (BMI 25.0-29.9) 07/05/2011  . Allergic rhinitis due to allergen 07/03/2011  . Venous insufficiency of leg 04/03/2011  . Blindness 07/23/2010  . DM (diabetes mellitus), type 2 with neurological complications (HCC) 06/13/2010  . Hypertension 06/13/2010  . Hyperlipidemia 06/13/2010     Vladimir Fasterobin Katrina Daddona, PT, DPT 05/15/2020, 4:52 PM  [facility] Physical Therapy [address] [place], [place], [address] Phone: [phone]   Fax:  [phone]  Name: Urban Gibsonhomas W Guardia MRN: [id number] Date of Birth: 12/07/1950

## 2020-05-20 ENCOUNTER — Ambulatory Visit (INDEPENDENT_AMBULATORY_CARE_PROVIDER_SITE_OTHER): Payer: Medicare Other | Admitting: Physical Therapy

## 2020-05-20 ENCOUNTER — Encounter: Payer: Self-pay | Admitting: Physical Therapy

## 2020-05-20 ENCOUNTER — Other Ambulatory Visit: Payer: Self-pay

## 2020-05-20 DIAGNOSIS — M6281 Muscle weakness (generalized): Secondary | ICD-10-CM | POA: Diagnosis not present

## 2020-05-20 DIAGNOSIS — R293 Abnormal posture: Secondary | ICD-10-CM | POA: Diagnosis not present

## 2020-05-20 DIAGNOSIS — R2681 Unsteadiness on feet: Secondary | ICD-10-CM

## 2020-05-20 DIAGNOSIS — R2689 Other abnormalities of gait and mobility: Secondary | ICD-10-CM

## 2020-05-20 NOTE — Therapy (Signed)
[facility] Physical Therapy [address] [place], [place], [address] Phone: [phone]   Fax:  [phone]  Physical Therapy Treatment  Patient Details  Name: Chad Hall MRN: [id number] Date of Birth: 1950/09/06 Referring Provider (PT): Bevely Palmer Persons, [place]   Encounter Date: 05/20/2020   PT End of Session - 05/20/20 1104    Visit Number 15    Number of Visits 25    Date for PT Re-Evaluation 06/26/20    Authorization Type UHC Medicare    Authorization Time Period 5% coinsurance    PT Start Time 1100    PT Stop Time 1143    PT Time Calculation (min) 43 min    Equipment Utilized During Treatment Gait belt    Activity Tolerance Patient tolerated treatment well    Behavior During Therapy [facility] for tasks assessed/performed           Past Medical History:  Diagnosis Date  . Allergy   . Blindness - both eyes 1967   Basketball injury  . Diabetes mellitus   . Hernia   . Hyperlipidemia   . Hypertension   . Marfan syndrome     Past Surgical History:  Procedure Laterality Date  . AMPUTATION Left 12/28/2018   Procedure: LEFT FOOT 3RD RAY AMPUTATION;  Surgeon: Newt Minion, MD;  Location: [place];  Service: Orthopedics;  Laterality: Left;  . AMPUTATION Left 01/03/2020   Procedure: LEFT BELOW KNEE AMPUTATION;  Surgeon: Newt Minion, MD;  Location: [place];  Service: Orthopedics;  Laterality: Left;  . APPENDECTOMY    . INGUINAL HERNIA REPAIR     Right 1970s,    Left 2011 by Dr Redmond Pulling  . INGUINAL HERNIA REPAIR  08/25/10   recurrent left  . [facility], Dr Janice Norrie  . RETINAL DETACHMENT SURGERY  1970  . Rotator Cuff Repair  2009   Left side, Dr Hal Morales   . ROTATOR CUFF REPAIR  2010-2011?   right side    There were no vitals filed for this visit.   Subjective Assessment - 05/20/20 1100    Subjective He has been checking the shrinker under liner every 5 hrs for dampness as PT recommended.    Pertinent History Lt  TTA, DM2, peripheral neuropathy, HTN, Marfan's syndrome blindness 1967    Patient Stated Goals walk with prosthesis like before which was no walking device & used service animal in community    Currently in Pain? No/denies                             [facility] Adult PT Treatment/Exercise - 05/20/20 1100      Transfers   Transfers Sit to Stand;Stand to Sit    Sit to Stand 6: Modified independent (Device/Increase time);With upper extremity assist;With armrests;From chair/3-in-1;Other (comment)   chairs with & without armrests   Sit to Stand Details Verbal cues for sequencing;Verbal cues for technique    Stand to Sit 6: Modified independent (Device/Increase time);With upper extremity assist;With armrests;To chair/3-in-1;Other (comment)   chairs with & without armrests   Stand to Sit Details (indicate cue type and reason) Tactile cues for sequencing;Verbal cues for technique      Ambulation/Gait   Ambulation/Gait Yes    Ambulation/Gait Assistance 5: Supervision    Ambulation Distance (Feet) 300 Feet   300' X 2 with dog & 29' X 2 without device   Assistive device Prosthesis;None;Other (Comment)  seeing eye dog for longer distances   Ramp 5: Supervision   prosthesis only, seeing eye dog   Curb 5: Supervision   prosthesis only, seeing eye dog     Neuro Re-ed    Neuro Re-ed Details  standing alternating UEs & BUEs rows & forward punch 10 reps ea green theraband.  tactile cues for upright posture & balance      Knee/Hip Exercises: Standing   Hip Flexion Stengthening;Right;Left;1 set;10 reps;Knee straight   red theraband   Hip Flexion Limitations PT verbal & tactile cues for balance reactions    Hip ADduction Strengthening;Right;Left;1 set;10 reps   red theraband   Hip ADduction Limitations PT verbal & tactile cues for balance reactions    Hip Abduction Stengthening;Right;Left;1 set;10 reps;Knee straight   red theraband   Abduction Limitations PT verbal & tactile cues for balance  reactions    Hip Extension Stengthening;Right;Left;1 set;10 reps;Knee straight   red theraband   Extension Limitations PT verbal & tactile cues for balance reactions      Prosthetics   Prosthetic Care Comments  Continue using cutoff Vivewear shrinker under liner. check shrinker every 4-5 hrs for wetness from sweating & switch to another cutoff shrinker if damp.    Current prosthetic wear tolerance (days/week)  daily    Current prosthetic wear tolerance (#hours/day)  increase to most of awake hours.    Edema no overt edema noted today    Residual limb condition  distal medial tibia area with callous & wound 0.5 cm with no signs of infection & granulation over surface, edges are less white from too much moisture, no signs of infection but color change from increased moisture.    Education Provided Proper wear schedule/adjustment;Other (comment);Residual limb care;Correct ply sock adjustment   see prosthetic care comments   Person(s) Educated Patient    Education Method Explanation;Verbal cues    Education Method Verbalized understanding;Verbal cues required                    PT Short Term Goals - 05/20/20 1225      PT SHORT TERM GOAL #1   Title Patient reports proper prosthetic care for sweat management & adjusting ply fit.    Time 4    Period Weeks    Status Achieved    Target Date 05/23/20      PT SHORT TERM GOAL #2   Title Berg Balance >36/56    Time 4    Period Weeks    Status On-going    Target Date 05/23/20      PT SHORT TERM GOAL #3   Title Patient ambulates in simulated household environment with prosthesis only with supervision.    Time 4    Period Weeks    Status Achieved    Target Date 05/23/20      PT SHORT TERM GOAL #4   Title Patient ambulates 400' with seeing eye dog & ambulatory cane with supervision.    Time 4    Period Weeks    Status Achieved    Target Date 05/23/20      PT SHORT TERM GOAL #5   Title Patient negotiates ramps / curb cuts &  curbs with seeing eye dog & ambulatory cane with supervision.    Time 4    Period Weeks    Status Achieved    Target Date 05/23/20             PT Long Term Goals - 04/24/20 1205  PT LONG TERM GOAL #1   Title Patient verbalizes & demonstrates understanding of proper prosthetic care to enable utilization of proshesis.    Time 12    Period Weeks    Status On-going    Target Date 06/26/20      PT LONG TERM GOAL #2   Title Patient tolerates prosthesis wear >90% of awake hours without skin or limb discomfort issues to enable function throughout his day.    Time 12    Period Weeks    Status On-going    Target Date 05/23/20      PT LONG TERM GOAL #3   Title Berg Balance >/= 45/56    Time 12    Period Weeks    Status On-going    Target Date 05/23/20      PT LONG TERM GOAL #4   Title Patient ambulates >500' with cane or less and seeing eye dog with prosthesis modified independent.    Time 12    Period Weeks    Status On-going    Target Date 05/23/20      PT LONG TERM GOAL #5   Title Patient negotiates ramps & curbs with cane or less and seeing eye dog with prosthesis modified independent.    Time 12    Period Weeks    Status On-going    Target Date 05/23/20                 Plan - 05/20/20 1104    Clinical Impression Statement Patient's balance with standing activities was improved today.  He met 4 STGs set for this 30 days.    Personal Factors and Comorbidities Comorbidity 3+    Comorbidities Lt TTA, DM2, peripheral neuropathy, HTN, Marfan's syndrome blindness 1967    Examination-Activity Limitations Lift;Locomotion Level;Stairs;Stand;Transfers    Stability/Clinical Decision Making Evolving/Moderate complexity    Rehab Potential Good    PT Frequency 2x / week    PT Duration 12 weeks    PT Treatment/Interventions ADLs/Self Care Home Management;DME Instruction;Gait training;Stair training;Functional mobility training;Therapeutic activities;Therapeutic  exercise;Balance training;Neuromuscular re-education;Patient/family education;Prosthetic Training    PT Next Visit Plan check Berg STGs, balance activities. prosthetic gait with ambulatory cane & visual cane.     Pt to bring his seeing eye dog to PT session to work on gait with dog.  continue education on prosthetic care.    Consulted and Agree with Plan of Care Patient           Patient will benefit from skilled therapeutic intervention in order to improve the following deficits and impairments:  Abnormal gait,Decreased activity tolerance,Decreased balance,Decreased endurance,Decreased knowledge of use of DME,Decreased mobility,Decreased skin integrity,Decreased strength,Increased edema,Postural dysfunction,Prosthetic Dependency  Visit Diagnosis: Unsteadiness on feet  Other abnormalities of gait and mobility  Abnormal posture  Muscle weakness (generalized)     Problem List Patient Active Problem List   Diagnosis Date Noted  . Maggot infestation 08/23/2019  . Osteomyelitis of foot, left, acute ([place]) 12/26/2018  . Right knee pain 10/04/2017  . Left hip pain 04/10/2013  . Diabetic peripheral neuropathy associated with type 2 diabetes mellitus ([place]) 11/10/2012  . Marfan's syndrome 08/08/2012  . Atopic dermatitis 02/16/2012  . Overweight (BMI 25.0-29.9) 07/05/2011  . Allergic rhinitis due to allergen 07/03/2011  . Venous insufficiency of leg 04/03/2011  . Blindness 07/23/2010  . DM (diabetes mellitus), type 2 with neurological complications ([place]) 32/99/2426  . Hypertension 06/13/2010  . Hyperlipidemia 06/13/2010    Jamey Reas, PT, DPT 05/20/2020, 12:28 PM  [facility] Physical Therapy [address] [place], [place], [address] Phone: [phone]   Fax:  [phone]  Name: Chad Hall MRN: [id number] Date of Birth: [birth year]

## 2020-05-22 ENCOUNTER — Ambulatory Visit (INDEPENDENT_AMBULATORY_CARE_PROVIDER_SITE_OTHER): Payer: Medicare Other | Admitting: Physical Therapy

## 2020-05-22 ENCOUNTER — Encounter: Payer: Self-pay | Admitting: Physical Therapy

## 2020-05-22 ENCOUNTER — Other Ambulatory Visit: Payer: Self-pay

## 2020-05-22 DIAGNOSIS — R293 Abnormal posture: Secondary | ICD-10-CM

## 2020-05-22 DIAGNOSIS — M6281 Muscle weakness (generalized): Secondary | ICD-10-CM | POA: Diagnosis not present

## 2020-05-22 DIAGNOSIS — R2681 Unsteadiness on feet: Secondary | ICD-10-CM | POA: Diagnosis not present

## 2020-05-22 DIAGNOSIS — R2689 Other abnormalities of gait and mobility: Secondary | ICD-10-CM

## 2020-05-22 NOTE — Therapy (Signed)
[facility] Physical Therapy [address] [place], [place], [address] Phone: [phone]   Fax:  [phone]  Physical Therapy Treatment  Patient Details  Name: Chad Hall MRN: [id number] Date of Birth: July 18, 1950 Referring Provider (PT): West Bali Persons, [place]   Encounter Date: 05/22/2020   PT End of Session - 05/22/20 1444    Visit Number 16    Number of Visits 25    Date for PT Re-Evaluation 06/26/20    Authorization Type U[employer] Medicare    Authorization Time Period 5% coinsurance    PT Start Time 1100    PT Stop Time 1145    PT Time Calculation (min) 45 min    Equipment Utilized During Treatment Gait belt    Activity Tolerance Patient tolerated treatment well    Behavior During Therapy [facility] for tasks assessed/performed           Past Medical History:  Diagnosis Date  . Allergy   . Blindness - both eyes 1967   Basketball injury  . Diabetes mellitus   . Hernia   . Hyperlipidemia   . Hypertension   . Marfan syndrome     Past Surgical History:  Procedure Laterality Date  . AMPUTATION Left 12/28/2018   Procedure: LEFT FOOT 3RD RAY AMPUTATION;  Surgeon: Nadara Mustard, MD;  Location: [facility] OR;  Service: Orthopedics;  Laterality: Left;  . AMPUTATION Left 01/03/2020   Procedure: LEFT BELOW KNEE AMPUTATION;  Surgeon: Nadara Mustard, MD;  Location: [facility] OR;  Service: Orthopedics;  Laterality: Left;  . APPENDECTOMY    . INGUINAL HERNIA REPAIR     Right 1970s,    Left 2011 by Dr Andrey Campanile  . INGUINAL HERNIA REPAIR  08/25/10   recurrent left  . PENILE PROSTHESIS IMPLANT  2000s   [facility], Dr Brunilda Payor  . RETINAL DETACHMENT SURGERY  1970  . Rotator Cuff Repair  2009   Left side, Dr Elita Quick   . ROTATOR CUFF REPAIR  2010-2011?   right side    There were no vitals filed for this visit.   Subjective Assessment - 05/22/20 1100    Subjective He put prosthesis on limb when he got up ~2am and has not checked or changed shrinker.    Pertinent History Lt TTA,  DM2, peripheral neuropathy, HTN, Marfan's syndrome blindness 1967    Patient Stated Goals walk with prosthesis like before which was no walking device & used service animal in community    Currently in Pain? No/denies              [facility] PT Assessment - 05/22/20 1100      Assessment   Medical Diagnosis Left Transtibial Amputation    Referring Provider (PT) [place] Bali Persons, [place]    Onset Date/Surgical Date 03/22/20      Sharlene Motts Balance Test   Sit to Stand Able to stand  independently using hands    Standing Unsupported Able to stand safely 2 minutes    Sitting with Back Unsupported but Feet Supported on Floor or Stool Able to sit safely and securely 2 minutes    Stand to Sit Sits safely with minimal use of hands    Transfers Able to transfer safely, minor use of hands    Standing Unsupported with Eyes Closed Able to stand 10 seconds safely    Standing Unsupported with Feet Together Able to place feet together independently and stand for 1 minute with supervision    From Standing, Reach Forward with Outstretched Arm  Can reach forward >12 cm safely (5")    From Standing Position, Pick up Object from Floor Able to pick up shoe, needs supervision    From Standing Position, Turn to Look Behind Over each Shoulder Looks behind from both sides and weight shifts well    Turn 360 Degrees Able to turn 360 degrees safely but slowly    Standing Unsupported, Alternately Place Feet on Step/Stool Able to complete >2 steps/needs minimal assist    Standing Unsupported, One Foot in Front Able to take small step independently and hold 30 seconds    Standing on One Leg Tries to lift leg/unable to hold 3 seconds but remains standing independently    Total Score 42                         [facility] Adult PT Treatment/Exercise - 05/22/20 1100      Transfers   Transfers Sit to Stand;Stand to Sit    Sit to Stand 6: Modified independent (Device/Increase time);With upper extremity assist;With  armrests;From chair/3-in-1;Other (comment)   chairs with & without armrests   Sit to Stand Details Verbal cues for sequencing;Verbal cues for technique    Stand to Sit 6: Modified independent (Device/Increase time);With upper extremity assist;With armrests;To chair/3-in-1;Other (comment)   chairs with & without armrests   Stand to Sit Details (indicate cue type and reason) Tactile cues for sequencing;Verbal cues for technique      Ambulation/Gait   Ambulation/Gait Yes    Ambulation/Gait Assistance 5: Supervision    Ambulation Distance (Feet) 50 Feet    Assistive device Prosthesis;None;Other (Comment)   seeing eye dog for longer distances   Ramp --    Curb --      High Level Balance   High Level Balance Activities Side stepping;Backward walking;Tandem walking    High Level Balance Comments inside //bars with intermittent support      Neuro Re-ed    Neuro Re-ed Details  --      Knee/Hip Exercises: Standing   Hip Flexion --    Hip Flexion Limitations --    Hip ADduction --    Hip ADduction Limitations --    Hip Abduction --    Abduction Limitations --    Hip Extension --    Extension Limitations --      Prosthetics   Prosthetic Care Comments  Continue using cutoff Vivewear shrinker under liner. check shrinker every 4-5 hrs for wetness from sweating & switch to another cutoff shrinker if damp. new small wound on incision line that appears from skin too moist.    Current prosthetic wear tolerance (days/week)  daily    Current prosthetic wear tolerance (#hours/day)  increase to most of awake hours.    Edema no overt edema noted today    Residual limb condition  New wound on insicion line 3mm superficial with no signs of infection.  distal medial tibia area with callous & wound 0.4 cm with no signs of infection & granulation over surface, edges are less white from too much moisture, no signs of infection but color change from increased moisture.    Education Provided Proper wear  schedule/adjustment;Other (comment);Residual limb care;Correct ply sock adjustment   see prosthetic care comments   Person(s) Educated Patient    Education Method Explanation;Verbal cues    Education Method Verbalized understanding;Verbal cues required               Balance Exercises - 05/22/20 1100  Balance Exercises: Standing   Standing Eyes Opened Head turns;Foam/compliant surface;5 reps    Standing Eyes Opened Limitations Head turns 4 directions. Tactile cues on balance reactions.    Stepping Strategy Anterior;Posterior;Foam/compliant surface;5 reps    Stepping Strategy Limitations standing crossways on foam beam - anticipatory stepping off & stabilizing without UE support.  Progressed to reactionary with PT pushing & pulling off balance. Difficulty catching balance stepping with prosthesis posterior > anterior               PT Short Term Goals - 05/22/20 1442      PT SHORT TERM GOAL #1   Title Patient reports proper prosthetic care for sweat management & adjusting ply fit.    Time 4    Period Weeks    Status Achieved    Target Date 05/23/20      PT SHORT TERM GOAL #2   Title Berg Balance >36/56    Time 4    Period Weeks    Status Achieved    Target Date 05/23/20      PT SHORT TERM GOAL #3   Title Patient ambulates in simulated household environment with prosthesis only with supervision.    Time 4    Period Weeks    Status Achieved    Target Date 05/23/20      PT SHORT TERM GOAL #4   Title Patient ambulates 400' with seeing eye dog & ambulatory cane with supervision.    Time 4    Period Weeks    Status Achieved    Target Date 05/23/20      PT SHORT TERM GOAL #5   Title Patient negotiates ramps / curb cuts & curbs with seeing eye dog & ambulatory cane with supervision.    Time 4    Period Weeks    Status Achieved    Target Date 05/23/20             PT Long Term Goals - 04/24/20 1205      PT LONG TERM GOAL #1   Title Patient verbalizes &  demonstrates understanding of proper prosthetic care to enable utilization of proshesis.    Time 12    Period Weeks    Status On-going    Target Date 06/26/20      PT LONG TERM GOAL #2   Title Patient tolerates prosthesis wear >90% of awake hours without skin or limb discomfort issues to enable function throughout his day.    Time 12    Period Weeks    Status On-going    Target Date 05/23/20      PT LONG TERM GOAL #3   Title Berg Balance >/= 45/56    Time 12    Period Weeks    Status On-going    Target Date 05/23/20      PT LONG TERM GOAL #4   Title Patient ambulates >500' with cane or less and seeing eye dog with prosthesis modified independent.    Time 12    Period Weeks    Status On-going    Target Date 05/23/20      PT LONG TERM GOAL #5   Title Patient negotiates ramps & curbs with cane or less and seeing eye dog with prosthesis modified independent.    Time 12    Period Weeks    Status On-going    Target Date 05/23/20                 Plan -  05/22/20 1444    Clinical Impression Statement Patient's balance has significantly improved with Berg Balance score of 42/56.  Patient has new wound 39mm on incision probably from too much moisture. He arrives to PT after wearing prosthesis 8+ hrs without checking or changing shrinker as advised.  He needs to see prosthetist for pads, height check & alignment check without device.    Personal Factors and Comorbidities Comorbidity 3+    Comorbidities Lt TTA, DM2, peripheral neuropathy, HTN, Marfan's syndrome blindness 1967    Examination-Activity Limitations Lift;Locomotion Level;Stairs;Stand;Transfers    Stability/Clinical Decision Making Evolving/Moderate complexity    Rehab Potential Good    PT Frequency 2x / week    PT Duration 12 weeks    PT Treatment/Interventions ADLs/Self Care Home Management;DME Instruction;Gait training;Stair training;Functional mobility training;Therapeutic activities;Therapeutic exercise;Balance  training;Neuromuscular re-education;Patient/family education;Prosthetic Training    PT Next Visit Plan check Berg STGs, balance activities. prosthetic gait with ambulatory cane & visual cane.     Pt to bring his seeing eye dog to PT session to work on gait with dog.  continue education on prosthetic care.    Consulted and Agree with Plan of Care Patient           Patient will benefit from skilled therapeutic intervention in order to improve the following deficits and impairments:  Abnormal gait,Decreased activity tolerance,Decreased balance,Decreased endurance,Decreased knowledge of use of DME,Decreased mobility,Decreased skin integrity,Decreased strength,Increased edema,Postural dysfunction,Prosthetic Dependency  Visit Diagnosis: Unsteadiness on feet  Other abnormalities of gait and mobility  Abnormal posture  Muscle weakness (generalized)     Problem List Patient Active Problem List   Diagnosis Date Noted  . Maggot infestation 08/23/2019  . Osteomyelitis of foot, left, acute (HCC) 12/26/2018  . Right knee pain 10/04/2017  . Left hip pain 04/10/2013  . Diabetic peripheral neuropathy associated with type 2 diabetes mellitus (HCC) 11/10/2012  . Marfan's syndrome 08/08/2012  . Atopic dermatitis 02/16/2012  . Overweight (BMI 25.0-29.9) 07/05/2011  . Allergic rhinitis due to allergen 07/03/2011  . Venous insufficiency of leg 04/03/2011  . Blindness 07/23/2010  . DM (diabetes mellitus), type 2 with neurological complications (HCC) 06/13/2010  . Hypertension 06/13/2010  . Hyperlipidemia 06/13/2010    Vladimir Faster, PT, DPT  05/22/2020, 2:57 PM  [facility] Physical Therapy [address] [place], [place], [address] Phone: [phone]   Fax:  [phone]  Name: Chad Hall MRN: [id number] Date of Birth: 1950-03-16

## 2020-05-27 ENCOUNTER — Encounter: Payer: Self-pay | Admitting: Physical Therapy

## 2020-05-27 ENCOUNTER — Other Ambulatory Visit: Payer: Self-pay

## 2020-05-27 ENCOUNTER — Ambulatory Visit (INDEPENDENT_AMBULATORY_CARE_PROVIDER_SITE_OTHER): Payer: Medicare Other | Admitting: Physical Therapy

## 2020-05-27 DIAGNOSIS — R2689 Other abnormalities of gait and mobility: Secondary | ICD-10-CM | POA: Diagnosis not present

## 2020-05-27 DIAGNOSIS — R293 Abnormal posture: Secondary | ICD-10-CM | POA: Diagnosis not present

## 2020-05-27 DIAGNOSIS — M6281 Muscle weakness (generalized): Secondary | ICD-10-CM | POA: Diagnosis not present

## 2020-05-27 DIAGNOSIS — R2681 Unsteadiness on feet: Secondary | ICD-10-CM | POA: Diagnosis not present

## 2020-05-27 NOTE — Therapy (Signed)
[facility] Physical Therapy [address] [place], [place], [address] Phone: [phone]   Fax:  [phone]  Physical Therapy Treatment  Patient Details  Name: Chad Hall MRN: [id number] Date of Birth: 1950/09/11 Referring Provider (PT): West Bali Persons, [place]   Encounter Date: 05/27/2020   PT End of Session - 05/27/20 1127    Visit Number 17    Number of Visits 25    Date for PT Re-Evaluation 06/26/20    Authorization Type UHC Medicare    Authorization Time Period 5% coinsurance    PT Start Time 1123    PT Stop Time 1146    PT Time Calculation (min) 23 min    Equipment Utilized During Treatment Gait belt    Activity Tolerance Patient tolerated treatment well    Behavior During Therapy [facility] for tasks assessed/performed           Past Medical History:  Diagnosis Date  . Allergy   . Blindness - both eyes 1967   Basketball injury  . Diabetes mellitus   . Hernia   . Hyperlipidemia   . Hypertension   . Marfan syndrome     Past Surgical History:  Procedure Laterality Date  . AMPUTATION Left 12/28/2018   Procedure: LEFT FOOT 3RD RAY AMPUTATION;  Surgeon: Nadara Mustard, MD;  Location: [facility] OR;  Service: Orthopedics;  Laterality: Left;  . AMPUTATION Left 01/03/2020   Procedure: LEFT BELOW KNEE AMPUTATION;  Surgeon: Nadara Mustard, MD;  Location: [facility] OR;  Service: Orthopedics;  Laterality: Left;  . APPENDECTOMY    . INGUINAL HERNIA REPAIR     Right 1970s,    Left 2011 by Dr Andrey Campanile  . INGUINAL HERNIA REPAIR  08/25/10   recurrent left  . PENILE PROSTHESIS IMPLANT  2000s   [facility], Dr Brunilda Payor  . RETINAL DETACHMENT SURGERY  1970  . Rotator Cuff Repair  2009   Left side, Dr Elita Quick   . ROTATOR CUFF REPAIR  2010-2011?   right side    There were no vitals filed for this visit.                      [facility] Adult PT Treatment/Exercise - 05/27/20 1123      Transfers   Transfers Sit to Stand;Stand to Sit    Sit to Stand 6:  Modified independent (Device/Increase time);With upper extremity assist;With armrests;From chair/3-in-1;Other (comment)   chairs with & without armrests   Sit to Stand Details Verbal cues for sequencing;Verbal cues for technique    Stand to Sit 6: Modified independent (Device/Increase time);With upper extremity assist;With armrests;To chair/3-in-1;Other (comment)   chairs with & without armrests   Stand to Sit Details (indicate cue type and reason) Tactile cues for sequencing;Verbal cues for technique      Ambulation/Gait   Ambulation/Gait Yes    Ambulation/Gait Assistance 5: Supervision    Ambulation Distance (Feet) 200 Feet   200 seeing eye dog and 50' without device simulating indoor ambulation   Assistive device Prosthesis;None;Other (Comment)   seeing eye dog for longer distances or outside   Ambulation Surface Level;Indoor;Outdoor;Paved      High Level Balance   High Level Balance Activities --    High Level Balance Comments --      Prosthetics   Prosthetic Care Comments  Continue using cutoff Vivewear shrinker under liner. check shrinker every 4-5 hrs for wetness from sweating & switch to another cutoff shrinker if damp.    Current prosthetic  wear tolerance (days/week)  daily    Current prosthetic wear tolerance (#hours/day)  reports most of awake hours.    Edema no overt edema noted today    Residual limb condition  wound now healed with granulation.  2nd small wound noted last week is healed.    Education Provided Other (comment);Residual limb care   see prosthetic care comments   Person(s) Educated Patient    Education Method Verbalized understanding;Verbal cues required    Donning Prosthesis Modified independent (device/increased time)                    PT Short Term Goals - 05/22/20 1442      PT SHORT TERM GOAL #1   Title Patient reports proper prosthetic care for sweat management & adjusting ply fit.    Time 4    Period Weeks    Status Achieved    Target  Date 05/23/20      PT SHORT TERM GOAL #2   Title Berg Balance >36/56    Time 4    Period Weeks    Status Achieved    Target Date 05/23/20      PT SHORT TERM GOAL #3   Title Patient ambulates in simulated household environment with prosthesis only with supervision.    Time 4    Period Weeks    Status Achieved    Target Date 05/23/20      PT SHORT TERM GOAL #4   Title Patient ambulates 400' with seeing eye dog & ambulatory cane with supervision.    Time 4    Period Weeks    Status Achieved    Target Date 05/23/20      PT SHORT TERM GOAL #5   Title Patient negotiates ramps / curb cuts & curbs with seeing eye dog & ambulatory cane with supervision.    Time 4    Period Weeks    Status Achieved    Target Date 05/23/20             PT Long Term Goals - 04/24/20 1205      PT LONG TERM GOAL #1   Title Patient verbalizes & demonstrates understanding of proper prosthetic care to enable utilization of proshesis.    Time 12    Period Weeks    Status On-going    Target Date 06/26/20      PT LONG TERM GOAL #2   Title Patient tolerates prosthesis wear >90% of awake hours without skin or limb discomfort issues to enable function throughout his day.    Time 12    Period Weeks    Status On-going    Target Date 05/23/20      PT LONG TERM GOAL #3   Title Berg Balance >/= 45/56    Time 12    Period Weeks    Status On-going    Target Date 05/23/20      PT LONG TERM GOAL #4   Title Patient ambulates >500' with cane or less and seeing eye dog with prosthesis modified independent.    Time 12    Period Weeks    Status On-going    Target Date 05/23/20      PT LONG TERM GOAL #5   Title Patient negotiates ramps & curbs with cane or less and seeing eye dog with prosthesis modified independent.    Time 12    Period Weeks    Status On-going    Target Date 05/23/20  Plan - 05/27/20 1128    Clinical Impression Statement Patient's wound appears to have  healed. He has an appt with prosthetist today for pads, ht & alignment checked.  If no further skin issues in next week, patient may be ready for discharge.    Personal Factors and Comorbidities Comorbidity 3+    Comorbidities Lt TTA, DM2, peripheral neuropathy, HTN, Marfan's syndrome blindness 1967    Examination-Activity Limitations Lift;Locomotion Level;Stairs;Stand;Transfers    Stability/Clinical Decision Making Evolving/Moderate complexity    Rehab Potential Good    PT Frequency 2x / week    PT Duration 12 weeks    PT Treatment/Interventions ADLs/Self Care Home Management;DME Instruction;Gait training;Stair training;Functional mobility training;Therapeutic activities;Therapeutic exercise;Balance training;Neuromuscular re-education;Patient/family education;Prosthetic Training    PT Next Visit Plan work towards LTGs with gait & balance activities.    Consulted and Agree with Plan of Care Patient           Patient will benefit from skilled therapeutic intervention in order to improve the following deficits and impairments:  Abnormal gait,Decreased activity tolerance,Decreased balance,Decreased endurance,Decreased knowledge of use of DME,Decreased mobility,Decreased skin integrity,Decreased strength,Increased edema,Postural dysfunction,Prosthetic Dependency  Visit Diagnosis: Unsteadiness on feet  Other abnormalities of gait and mobility  Abnormal posture  Muscle weakness (generalized)     Problem List Patient Active Problem List   Diagnosis Date Noted  . Maggot infestation 08/23/2019  . Osteomyelitis of foot, left, acute (HCC) 12/26/2018  . Right knee pain 10/04/2017  . Left hip pain 04/10/2013  . Diabetic peripheral neuropathy associated with type 2 diabetes mellitus (HCC) 11/10/2012  . Marfan's syndrome 08/08/2012  . Atopic dermatitis 02/16/2012  . Overweight (BMI 25.0-29.9) 07/05/2011  . Allergic rhinitis due to allergen 07/03/2011  . Venous insufficiency of leg  04/03/2011  . Blindness 07/23/2010  . DM (diabetes mellitus), type 2 with neurological complications (HCC) 06/13/2010  . Hypertension 06/13/2010  . Hyperlipidemia 06/13/2010    Vladimir Faster, PT, DPT 05/27/2020, 2:26 PM  [facility] Physical Therapy [address] [place], [place], [address] Phone: [phone]   Fax:  [phone]  Name: Chad Hall MRN: [id number] Date of Birth: 1951-01-28

## 2020-05-29 ENCOUNTER — Other Ambulatory Visit: Payer: Self-pay

## 2020-05-29 MED ORDER — METOPROLOL SUCCINATE ER 25 MG PO TB24
12.5000 mg | ORAL_TABLET | Freq: Every evening | ORAL | 3 refills | Status: DC
Start: 2020-05-29 — End: 2020-09-19

## 2020-05-30 ENCOUNTER — Ambulatory Visit (INDEPENDENT_AMBULATORY_CARE_PROVIDER_SITE_OTHER): Payer: Medicare Other | Admitting: Physician Assistant

## 2020-05-30 ENCOUNTER — Encounter: Payer: Self-pay | Admitting: Orthopedic Surgery

## 2020-05-30 VITALS — Ht 77.0 in | Wt 199.0 lb

## 2020-05-30 DIAGNOSIS — Z89422 Acquired absence of other left toe(s): Secondary | ICD-10-CM

## 2020-05-30 NOTE — Progress Notes (Signed)
Office Visit Note   Patient: Chad Hall           Date of Birth: 1950-02-20           MRN: [id number] Visit Date: 05/30/2020              Requested by: [name], MD [address] [place],  [place] [address] PCP: [name], MD  Chief Complaint  Patient presents with  . Left Leg - Follow-up    Left below knee amputation 01/03/2020      HPI: Patient is a pleasant vision impaired 70 year old gentleman who is 5 months status post left below-knee amputation.  He is completing physical therapy and ambulating without any difficulty  Assessment & Plan: Visit Diagnoses: No diagnosis found.  Plan: He may follow-up as needed he understands he is to have someone examine his stump on a periodic basis as well as his feet.  If he has any concerns she is to follow-up with [place] immediately.  Follow-Up Instructions: No follow-ups on file.   Ortho Exam  Patient is alert, oriented, no adenopathy, well-dressed, normal affect, normal respiratory effort. Patient is ambulating well and with ease.  Below-knee amputation stump is completely healed.  Swelling is well controlled there is no ascending cellulitis or signs of recurrent infection  Imaging: No results found. No images are attached to the encounter.  Labs: Lab Results  Component Value Date   HGBA1C 5.3 01/01/2020   HGBA1C 5.4 09/26/2019   HGBA1C 7.0 05/04/2019   ESRSEDRATE 108 (H) 12/21/2019   CRP 35.5 (H) 12/21/2019   REPTSTATUS 01/02/2020 FINAL 01/01/2020   GRAMSTAIN NO WBC SEEN RARE GRAM POSITIVE COCCI  12/08/2019   CULT (A) 01/01/2020    <10,000 COLONIES/mL INSIGNIFICANT GROWTH Performed at [facility], [address] [place], [place] [address]    Imelda Pillow [facility] MORGANII 01/01/2020     Lab Results  Component Value Date   ALBUMIN 1.9 (L) 01/03/2020   ALBUMIN 2.0 (L) 01/02/2020   ALBUMIN 2.6 (L) 01/01/2020    No results found for: MG No results found for: VD25OH  No results found for:  PREALBUMIN CBC EXTENDED Latest Ref Rng & Units 01/07/2020 01/06/2020 01/05/2020  WBC 4.0 - 10.5 K/uL 7.8 8.0 6.8  RBC 4.22 - 5.81 MIL/uL 3.05(L) 2.94(L) 2.89(L)  HGB 13.0 - 17.0 g/dL 6.9(G) 8.3(L) 8.2(L)  HCT 39.0 - 52.0 % 29.1(L) 28.2(L) 28.1(L)  PLT 150 - 400 K/uL 370 330 338  NEUTROABS 1.7 - 7.7 K/uL - - -  LYMPHSABS 0.7 - 4.0 K/uL - - -     Body mass index is 23.6 kg/m.  Orders:  No orders of the defined types were placed in this encounter.  No orders of the defined types were placed in this encounter.    Procedures: No procedures performed  Clinical Data: No additional findings.  ROS:  All other systems negative, except as noted in the HPI. Review of Systems  Objective: Vital Signs: Ht 6\' 5"  (1.956 m)   Wt 199 lb (90.3 kg)   BMI 23.60 kg/m   Specialty Comments:  No specialty comments available.  PMFS History: Patient Active Problem List   Diagnosis Date Noted  . Maggot infestation 08/23/2019  . Osteomyelitis of foot, left, acute (HCC) 12/26/2018  . Right knee pain 10/04/2017  . Left hip pain 04/10/2013  . Diabetic peripheral neuropathy associated with type 2 diabetes mellitus (HCC) 11/10/2012  . Marfan's syndrome 08/08/2012  . Atopic dermatitis 02/16/2012  .  Overweight (BMI 25.0-29.9) 07/05/2011  . Allergic rhinitis due to allergen 07/03/2011  . Venous insufficiency of leg 04/03/2011  . Blindness 07/23/2010  . DM (diabetes mellitus), type 2 with neurological complications (HCC) 06/13/2010  . Hypertension 06/13/2010  . Hyperlipidemia 06/13/2010   Past Medical History:  Diagnosis Date  . Allergy   . Blindness - both eyes 1967   Basketball injury  . Diabetes mellitus   . Hernia   . Hyperlipidemia   . Hypertension   . Marfan syndrome     Family History  Problem Relation Age of Onset  . Heart disease Mother   . Breast cancer Sister     Past Surgical History:  Procedure Laterality Date  . AMPUTATION Left 12/28/2018   Procedure: LEFT FOOT  3RD RAY AMPUTATION;  Surgeon: Nadara Mustard, MD;  Location: [facility];  Service: Orthopedics;  Laterality: Left;  . AMPUTATION Left 01/03/2020   Procedure: LEFT BELOW KNEE AMPUTATION;  Surgeon: Nadara Mustard, MD;  Location: [facility] OR;  Service: Orthopedics;  Laterality: Left;  . APPENDECTOMY    . INGUINAL HERNIA REPAIR     Right 1970s,    Left 2011 by Dr Andrey Campanile  . INGUINAL HERNIA REPAIR  08/25/10   recurrent left  . PENILE PROSTHESIS IMPLANT  2000s   [facility], Dr Brunilda Payor  . RETINAL DETACHMENT SURGERY  1970  . Rotator Cuff Repair  2009   Left side, Dr Elita Quick   . ROTATOR CUFF REPAIR  2010-2011?   right side   Social History   Occupational History  . 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 Smoking status: Former Smoker    Quit date: 02/14/1984    Years since quitting: 36.3  . Smokeless tobacco: Never Used  Substance and Sexual Activity  . Alcohol use: No    Comment: Quit in 10/1983  . Drug use: No  . Sexual activity: Not on file

## 2020-06-04 ENCOUNTER — Ambulatory Visit (INDEPENDENT_AMBULATORY_CARE_PROVIDER_SITE_OTHER): Payer: Medicare Other | Admitting: Physical Therapy

## 2020-06-04 ENCOUNTER — Other Ambulatory Visit: Payer: Self-pay

## 2020-06-04 ENCOUNTER — Encounter: Payer: Self-pay | Admitting: Physical Therapy

## 2020-06-04 DIAGNOSIS — M6281 Muscle weakness (generalized): Secondary | ICD-10-CM | POA: Diagnosis not present

## 2020-06-04 DIAGNOSIS — R293 Abnormal posture: Secondary | ICD-10-CM | POA: Diagnosis not present

## 2020-06-04 DIAGNOSIS — R2689 Other abnormalities of gait and mobility: Secondary | ICD-10-CM | POA: Diagnosis not present

## 2020-06-04 DIAGNOSIS — R2681 Unsteadiness on feet: Secondary | ICD-10-CM | POA: Diagnosis not present

## 2020-06-04 NOTE — Therapy (Signed)
[facility] Physical Therapy [address] [place], [place], [address] Phone: [phone]   Fax:  [phone]  Physical Therapy Treatment & Discharge Summary  Patient Details  Name: Chad Hall MRN: [id number] Date of Birth: 1950-04-13 Referring Provider (PT): Bevely Palmer Persons, [place]   Encounter Date: 06/04/2020   PHYSICAL THERAPY DISCHARGE SUMMARY  Visits from Start of Care: 18  Current functional level related to goals / functional outcomes: See below   Remaining deficits: See below   Education / Equipment: HEP & prosthetic care  Plan: Patient agrees to discharge.  Patient goals were met. Patient is being discharged due to meeting the stated rehab goals.  ?????          PT End of Session - 06/04/20 1108    Visit Number 18    Number of Visits 25    Date for PT Re-Evaluation 06/26/20    Authorization Type [employer] Medicare    Authorization Time Period 5% coinsurance    PT Start Time 1100    PT Stop Time 1130    PT Time Calculation (min) 30 min    Equipment Utilized During Treatment Gait belt    Activity Tolerance Patient tolerated treatment well    Behavior During Therapy WFL for tasks assessed/performed           Past Medical History:  Diagnosis Date  . Allergy   . Blindness - both eyes 1967   Basketball injury  . Diabetes mellitus   . Hernia   . Hyperlipidemia   . Hypertension   . Marfan syndrome     Past Surgical History:  Procedure Laterality Date  . AMPUTATION Left 12/28/2018   Procedure: LEFT FOOT 3RD RAY AMPUTATION;  Surgeon: Newt Minion, MD;  Location: [facility];  Service: Orthopedics;  Laterality: Left;  . AMPUTATION Left 01/03/2020   Procedure: LEFT BELOW KNEE AMPUTATION;  Surgeon: Newt Minion, MD;  Location: [facility];  Service: Orthopedics;  Laterality: Left;  . APPENDECTOMY    . INGUINAL HERNIA REPAIR     Right 1970s,    Left 2011 by Dr Redmond Pulling  . INGUINAL HERNIA REPAIR  08/25/10   recurrent left  . [facility], Dr Janice Norrie  . RETINAL DETACHMENT SURGERY  1970  . Rotator Cuff Repair  2009   Left side, Dr Hal Morales   . ROTATOR CUFF REPAIR  2010-2011?   right side    There were no vitals filed for this visit.   Subjective Assessment - 06/04/20 1100    Subjective He saw Bevely Palmer Persons, PA who was pleased with his progress.    Pertinent History Lt TTA, DM2, peripheral neuropathy, HTN, Marfan's syndrome blindness 1967    Patient Stated Goals walk with prosthesis like before which was no walking device & used service animal in community    Currently in Pain? No/denies              [facility] PT Assessment - 06/04/20 1100      Assessment   Medical Diagnosis Left Transtibial Amputation    Referring Provider (PT) Bevely Palmer Persons, PA      Ambulation/Gait   Ambulation/Gait Assistance 6: Modified independent (Device/Increase time)    Ambulation/Gait Assistance Details Pt ambulates in familar environments by touch or sound without assistive device and community based with seeing eye dog or guide.    Ambulation Distance (Feet) 500 Feet    Ambulation Surface Level;Indoor;Outdoor;Paved    Ramp 6:  Modified independent (Device)   seeing eye dog &  TTA prosthesis   Curb 6: Modified independent (Device/increase time)   seeing eye dog &  TTA prosthesis     Berg Balance Test   Sit to Stand Able to stand  independently using hands    Standing Unsupported Able to stand safely 2 minutes    Sitting with Back Unsupported but Feet Supported on Floor or Stool Able to sit safely and securely 2 minutes    Stand to Sit Sits safely with minimal use of hands    Transfers Able to transfer safely, minor use of hands    Standing Unsupported with Eyes Closed Able to stand 10 seconds safely    Standing Unsupported with Feet Together Able to place feet together independently and stand 1 minute safely    From Standing, Reach Forward with Outstretched Arm Can reach confidently >25 cm (10")     From Standing Position, Pick up Object from Floor Able to pick up shoe, needs supervision    From Standing Position, Turn to Look Behind Over each Shoulder Looks behind from both sides and weight shifts well    Turn 360 Degrees Able to turn 360 degrees safely but slowly    Standing Unsupported, Alternately Place Feet on Step/Stool Able to complete 4 steps without aid or supervision    Standing Unsupported, One Foot in Front Able to plae foot ahead of the other independently and hold 30 seconds    Standing on One Leg Able to lift leg independently and hold equal to or more than 3 seconds    Total Score 47    Berg comment: initial berg was 16/56 & on 05/22/20 was 42/56.    < 36 high risk for falls (close to 100%) 46-51 moderate (>50%)   37-45 significant (>80%) 52-55 lower (> 25%)                         [facility] Adult PT Treatment/Exercise - 06/04/20 1100      Transfers   Transfers Sit to Stand;Stand to Sit    Sit to Stand 6: Modified independent (Device/Increase time);With upper extremity assist;With armrests;From chair/3-in-1;Other (comment)   chairs with & without armrests   Sit to Stand Details Verbal cues for sequencing;Verbal cues for technique    Stand to Sit 6: Modified independent (Device/Increase time);With upper extremity assist;With armrests;To chair/3-in-1;Other (comment)   chairs with & without armrests   Stand to Sit Details (indicate cue type and reason) Tactile cues for sequencing;Verbal cues for technique      Ambulation/Gait   Ambulation/Gait Yes    Assistive device Prosthesis;None;Other (Comment)   seeing eye dog for longer distances or outside     Prosthetics   Prosthetic Care Comments  reviewed sweat management & pt verbalized understanding.    Current prosthetic wear tolerance (days/week)  daily    Current prosthetic wear tolerance (#hours/day)  reports most of awake hours.    Edema no overt edema noted today    Residual limb condition  wound now healed  with granulation.  2nd small wound noted last week is healed. normal color, temperature & moisture.                    PT Short Term Goals - 05/22/20 1442      PT SHORT TERM GOAL #1   Title Patient reports proper prosthetic care for sweat management & adjusting ply fit.    Time  4    Period Weeks    Status Achieved    Target Date 05/23/20      PT SHORT TERM GOAL #2   Title Berg Balance >36/56    Time 4    Period Weeks    Status Achieved    Target Date 05/23/20      PT SHORT TERM GOAL #3   Title Patient ambulates in simulated household environment with prosthesis only with supervision.    Time 4    Period Weeks    Status Achieved    Target Date 05/23/20      PT SHORT TERM GOAL #4   Title Patient ambulates 400' with seeing eye dog & ambulatory cane with supervision.    Time 4    Period Weeks    Status Achieved    Target Date 05/23/20      PT SHORT TERM GOAL #5   Title Patient negotiates ramps / curb cuts & curbs with seeing eye dog & ambulatory cane with supervision.    Time 4    Period Weeks    Status Achieved    Target Date 05/23/20             PT Long Term Goals - 06/04/20 1135      PT LONG TERM GOAL #1   Title Patient verbalizes & demonstrates understanding of proper prosthetic care to enable utilization of proshesis.    Time 12    Period Weeks    Status Achieved      PT LONG TERM GOAL #2   Title Patient tolerates prosthesis wear >90% of awake hours without skin or limb discomfort issues to enable function throughout his day.    Time 12    Period Weeks    Status Achieved      PT LONG TERM GOAL #3   Title Berg Balance >/= 45/56    Time 12    Period Weeks    Status On-going      PT LONG TERM GOAL #4   Title Patient ambulates >500' with cane or less and seeing eye dog with prosthesis modified independent.    Time 12    Period Weeks    Status Achieved      PT LONG TERM GOAL #5   Title Patient negotiates ramps & curbs with cane or less  and seeing eye dog with prosthesis modified independent.    Time 12    Period Weeks    Status Achieved                 Plan - 06/04/20 1109    Clinical Impression Statement Patient met all LTGs.  His wounds have all healed. He appears to functioning at prior level per his report.    Personal Factors and Comorbidities Comorbidity 3+    Comorbidities Lt TTA, DM2, peripheral neuropathy, HTN, Marfan's syndrome blindness 1967    Examination-Activity Limitations Lift;Locomotion Level;Stairs;Stand;Transfers    Stability/Clinical Decision Making Evolving/Moderate complexity    Rehab Potential Good    PT Frequency 2x / week    PT Duration 12 weeks    PT Treatment/Interventions ADLs/Self Care Home Management;DME Instruction;Gait training;Stair training;Functional mobility training;Therapeutic activities;Therapeutic exercise;Balance training;Neuromuscular re-education;Patient/family education;Prosthetic Training    PT Next Visit Plan discharge    Consulted and Agree with Plan of Care Patient           Patient will benefit from skilled therapeutic intervention in order to improve the following deficits and impairments:  Abnormal gait,Decreased activity  tolerance,Decreased balance,Decreased endurance,Decreased knowledge of use of DME,Decreased mobility,Decreased skin integrity,Decreased strength,Increased edema,Postural dysfunction,Prosthetic Dependency  Visit Diagnosis: Unsteadiness on feet  Other abnormalities of gait and mobility  Abnormal posture  Muscle weakness (generalized)     Problem List Patient Active Problem List   Diagnosis Date Noted  . Maggot infestation 08/23/2019  . Osteomyelitis of foot, left, acute ([place]) 12/26/2018  . Right knee pain 10/04/2017  . Left hip pain 04/10/2013  . Diabetic peripheral neuropathy associated with type 2 diabetes mellitus ([place]) 11/10/2012  . Marfan's syndrome 08/08/2012  . Atopic dermatitis 02/16/2012  . Overweight (BMI 25.0-29.9)  07/05/2011  . Allergic rhinitis due to allergen 07/03/2011  . Venous insufficiency of leg 04/03/2011  . Blindness 07/23/2010  . DM (diabetes mellitus), type 2 with neurological complications ([place]) 11/22/1217  . Hypertension 06/13/2010  . Hyperlipidemia 06/13/2010    Jamey Reas, PT, DPT 06/04/2020, 11:38 AM  [facility] Physical Therapy [address] [place], [place], [address] Phone: [phone]   Fax:  [phone]  Name: Chad Hall MRN: [id number] Date of Birth: 12-28-1950

## 2020-06-06 ENCOUNTER — Encounter: Payer: Medicare Other | Admitting: Physical Therapy

## 2020-06-10 ENCOUNTER — Encounter: Payer: Self-pay | Admitting: Physical Therapy

## 2020-07-04 ENCOUNTER — Other Ambulatory Visit: Payer: Self-pay | Admitting: Family Medicine

## 2020-09-19 ENCOUNTER — Other Ambulatory Visit: Payer: Self-pay

## 2020-09-19 ENCOUNTER — Encounter: Payer: Self-pay | Admitting: Family Medicine

## 2020-09-19 ENCOUNTER — Ambulatory Visit: Payer: Medicare Other | Admitting: Family Medicine

## 2020-09-19 VITALS — BP 150/64 | HR 60 | Ht 77.0 in | Wt 207.2 lb

## 2020-09-19 DIAGNOSIS — D649 Anemia, unspecified: Secondary | ICD-10-CM

## 2020-09-19 DIAGNOSIS — I10 Essential (primary) hypertension: Secondary | ICD-10-CM

## 2020-09-19 DIAGNOSIS — E785 Hyperlipidemia, unspecified: Secondary | ICD-10-CM | POA: Diagnosis not present

## 2020-09-19 DIAGNOSIS — E1149 Type 2 diabetes mellitus with other diabetic neurological complication: Secondary | ICD-10-CM | POA: Diagnosis not present

## 2020-09-19 MED ORDER — GABAPENTIN 100 MG PO CAPS: 300.0000 mg | ORAL_CAPSULE | Freq: Every day | ORAL | 1 refills | Status: DC

## 2020-09-19 MED ORDER — EMPAGLIFLOZIN 10 MG PO TABS
10.0000 mg | ORAL_TABLET | Freq: Every day | ORAL | 3 refills | Status: DC
Start: 2020-09-19 — End: 2020-11-18

## 2020-09-19 MED ORDER — ENALAPRIL MALEATE 20 MG PO TABS
20.0000 mg | ORAL_TABLET | Freq: Every day | ORAL | 3 refills | Status: DC
Start: 2020-09-19 — End: 2020-11-18

## 2020-09-19 MED ORDER — LINAGLIPTIN 5 MG PO TABS
5.0000 mg | ORAL_TABLET | Freq: Every day | ORAL | 3 refills | Status: DC
Start: 2020-09-19 — End: 2021-04-17

## 2020-09-19 MED ORDER — ATORVASTATIN CALCIUM 80 MG PO TABS
80.0000 mg | ORAL_TABLET | Freq: Every day | ORAL | 3 refills | Status: DC
Start: 2020-09-19 — End: 2021-04-17

## 2020-09-19 MED ORDER — METOPROLOL SUCCINATE ER 25 MG PO TB24
12.5000 mg | ORAL_TABLET | Freq: Every evening | ORAL | 3 refills | Status: DC
Start: 2020-09-19 — End: 2021-04-17

## 2020-09-19 NOTE — Patient Instructions (Signed)
We are going to check some lab work today to check on your cholesterol as well as your blood counts.  I sent in prescription refills for your medications, please let me know if you have any issues with these.  Continue take your medications as prescribed, I will call you if we need to change anything based on your lab results.

## 2020-09-19 NOTE — Assessment & Plan Note (Addendum)
BP elevated in office at 150/64.  Patient reports that home readings are in the 130s/80s.  Patient currently on enalapril 20 mg daily and metoprolol 12.5mg . - Continue enalapril and metoprolol

## 2020-09-19 NOTE — Assessment & Plan Note (Addendum)
Due to lab error, A1c was not collected today. Currently on Linagliptin and Jardiance with good compliance. Gabapentin 300mg  daily, has not yet increased to this dose as he did not believe he had enough doses and would run out.  - Refills sent in for linagliptin, jardiance, and gabapentin - Will need A1c at next visit.

## 2020-09-19 NOTE — Progress Notes (Signed)
    SUBJECTIVE:   CHIEF COMPLAINT / HPI:   Patient reports that he is here for a checkup.  He has no acute complaints at this time.  Patient ports he has been taking his medications as prescribed.  He does note that his blood pressure is elevated typically when he comes in the office but states that at home it is in the 130s/80s.  PERTINENT  PMH / PSH: Reviewed  OBJECTIVE:   BP (!) 150/64   Pulse 60   Ht 6\' 5"  (1.956 m)   Wt 207 lb 4 oz (94 kg)   SpO2 99%   BMI 24.58 kg/m   Gen: well-appearing, NAD CV: RRR, no m/r/g appreciated, no peripheral edema Pulm: CTAB, no wheezes/crackles GI: soft, non-tender, non-distended  ASSESSMENT/PLAN:   Hypertension BP elevated in office at 150/64.  Patient reports that home readings are in the 130s/80s.  Patient currently on enalapril 20 mg daily and metoprolol 12.5mg . - Continue enalapril and metoprolol  DM (diabetes mellitus), type 2 with neurological complications (HCC) Due to lab error, A1c was not collected today. Currently on Linagliptin and Jardiance with good compliance. Gabapentin 300mg  daily, has not yet increased to this dose as he did not believe he had enough doses and would run out.  - Refills sent in for linagliptin, jardiance, and gabapentin - Will need A1c at next visit.     April 22, DO [place] [facility]

## 2020-09-20 ENCOUNTER — Other Ambulatory Visit: Payer: Self-pay | Admitting: Family Medicine

## 2020-09-20 LAB — IRON,TIBC AND FERRITIN PANEL
Ferritin: 195 ng/mL (ref 30–400)
Iron Saturation: 23 % (ref 15–55)
Iron: 52 ug/dL (ref 38–169)
Total Iron Binding Capacity: 225 ug/dL — ABNORMAL LOW (ref 250–450)
UIBC: 173 ug/dL (ref 111–343)

## 2020-09-20 LAB — LIPID PANEL
Chol/HDL Ratio: 2.1 ratio (ref 0.0–5.0)
Cholesterol, Total: 128 mg/dL (ref 100–199)
HDL: 60 mg/dL (ref >39)
LDL Chol Calc (NIH): 55 mg/dL (ref 0–99)
Triglycerides: 64 mg/dL (ref 0–149)
VLDL Cholesterol Cal: 13 mg/dL (ref 5–40)

## 2020-09-20 NOTE — Addendum Note (Signed)
Addended by: Brandon Melnick on: 09/20/2020 06:43 PM   Modules accepted: Level of Service

## 2020-09-23 ENCOUNTER — Encounter: Payer: Self-pay | Admitting: Family Medicine

## 2020-10-13 ENCOUNTER — Other Ambulatory Visit: Payer: Self-pay | Admitting: Family Medicine

## 2020-11-11 ENCOUNTER — Inpatient Hospital Stay (HOSPITAL_COMMUNITY)
Admission: EM | Admit: 2020-11-11 | Discharge: 2020-11-18 | DRG: 617 | Disposition: A | Discharge status: Home Health | Payer: Medicare Other | Attending: Family Medicine | Admitting: Family Medicine

## 2020-11-11 ENCOUNTER — Encounter (HOSPITAL_COMMUNITY): Payer: Self-pay | Admitting: Emergency Medicine

## 2020-11-11 ENCOUNTER — Observation Stay (HOSPITAL_COMMUNITY): Payer: Medicare Other

## 2020-11-11 ENCOUNTER — Emergency Department (HOSPITAL_COMMUNITY): Payer: Medicare Other

## 2020-11-11 ENCOUNTER — Other Ambulatory Visit: Payer: Self-pay

## 2020-11-11 DIAGNOSIS — E1169 Type 2 diabetes mellitus with other specified complication: Secondary | ICD-10-CM | POA: Diagnosis not present

## 2020-11-11 DIAGNOSIS — I712 Thoracic aortic aneurysm, without rupture, unspecified: Secondary | ICD-10-CM | POA: Diagnosis present

## 2020-11-11 DIAGNOSIS — E43 Unspecified severe protein-calorie malnutrition: Secondary | ICD-10-CM | POA: Diagnosis present

## 2020-11-11 DIAGNOSIS — I110 Hypertensive heart disease with heart failure: Secondary | ICD-10-CM | POA: Diagnosis present

## 2020-11-11 DIAGNOSIS — Z7984 Long term (current) use of oral hypoglycemic drugs: Secondary | ICD-10-CM

## 2020-11-11 DIAGNOSIS — L97519 Non-pressure chronic ulcer of other part of right foot with unspecified severity: Secondary | ICD-10-CM | POA: Diagnosis present

## 2020-11-11 DIAGNOSIS — R531 Weakness: Secondary | ICD-10-CM | POA: Diagnosis not present

## 2020-11-11 DIAGNOSIS — B951 Streptococcus, group B, as the cause of diseases classified elsewhere: Secondary | ICD-10-CM | POA: Diagnosis present

## 2020-11-11 DIAGNOSIS — T148XXA Other injury of unspecified body region, initial encounter: Secondary | ICD-10-CM | POA: Diagnosis not present

## 2020-11-11 DIAGNOSIS — Z79899 Other long term (current) drug therapy: Secondary | ICD-10-CM

## 2020-11-11 DIAGNOSIS — E875 Hyperkalemia: Secondary | ICD-10-CM | POA: Diagnosis present

## 2020-11-11 DIAGNOSIS — E785 Hyperlipidemia, unspecified: Secondary | ICD-10-CM | POA: Diagnosis present

## 2020-11-11 DIAGNOSIS — Z20822 Contact with and (suspected) exposure to covid-19: Secondary | ICD-10-CM | POA: Diagnosis present

## 2020-11-11 DIAGNOSIS — H547 Unspecified visual loss: Secondary | ICD-10-CM

## 2020-11-11 DIAGNOSIS — L089 Local infection of the skin and subcutaneous tissue, unspecified: Secondary | ICD-10-CM

## 2020-11-11 DIAGNOSIS — E1152 Type 2 diabetes mellitus with diabetic peripheral angiopathy with gangrene: Secondary | ICD-10-CM | POA: Diagnosis present

## 2020-11-11 DIAGNOSIS — B879 Myiasis, unspecified: Secondary | ICD-10-CM

## 2020-11-11 DIAGNOSIS — B9562 Methicillin resistant Staphylococcus aureus infection as the cause of diseases classified elsewhere: Secondary | ICD-10-CM | POA: Diagnosis present

## 2020-11-11 DIAGNOSIS — I872 Venous insufficiency (chronic) (peripheral): Secondary | ICD-10-CM | POA: Diagnosis present

## 2020-11-11 DIAGNOSIS — L209 Atopic dermatitis, unspecified: Secondary | ICD-10-CM | POA: Diagnosis present

## 2020-11-11 DIAGNOSIS — Z87891 Personal history of nicotine dependence: Secondary | ICD-10-CM

## 2020-11-11 DIAGNOSIS — E860 Dehydration: Secondary | ICD-10-CM | POA: Diagnosis present

## 2020-11-11 DIAGNOSIS — I96 Gangrene, not elsewhere classified: Secondary | ICD-10-CM | POA: Diagnosis present

## 2020-11-11 DIAGNOSIS — M8600 Acute hematogenous osteomyelitis, unspecified site: Secondary | ICD-10-CM

## 2020-11-11 DIAGNOSIS — L02611 Cutaneous abscess of right foot: Secondary | ICD-10-CM | POA: Diagnosis present

## 2020-11-11 DIAGNOSIS — H548 Legal blindness, as defined in USA: Secondary | ICD-10-CM | POA: Diagnosis present

## 2020-11-11 DIAGNOSIS — Z89512 Acquired absence of left leg below knee: Secondary | ICD-10-CM

## 2020-11-11 DIAGNOSIS — D72829 Elevated white blood cell count, unspecified: Secondary | ICD-10-CM

## 2020-11-11 DIAGNOSIS — A499 Bacterial infection, unspecified: Secondary | ICD-10-CM

## 2020-11-11 DIAGNOSIS — Q8740 Marfan's syndrome, unspecified: Secondary | ICD-10-CM

## 2020-11-11 DIAGNOSIS — I5032 Chronic diastolic (congestive) heart failure: Secondary | ICD-10-CM | POA: Diagnosis present

## 2020-11-11 DIAGNOSIS — Z8249 Family history of ischemic heart disease and other diseases of the circulatory system: Secondary | ICD-10-CM

## 2020-11-11 DIAGNOSIS — E1149 Type 2 diabetes mellitus with other diabetic neurological complication: Secondary | ICD-10-CM | POA: Diagnosis present

## 2020-11-11 DIAGNOSIS — B871 Wound myiasis: Secondary | ICD-10-CM | POA: Diagnosis present

## 2020-11-11 DIAGNOSIS — I739 Peripheral vascular disease, unspecified: Secondary | ICD-10-CM | POA: Diagnosis present

## 2020-11-11 DIAGNOSIS — E1142 Type 2 diabetes mellitus with diabetic polyneuropathy: Secondary | ICD-10-CM | POA: Diagnosis present

## 2020-11-11 DIAGNOSIS — N179 Acute kidney failure, unspecified: Secondary | ICD-10-CM | POA: Diagnosis present

## 2020-11-11 DIAGNOSIS — M869 Osteomyelitis, unspecified: Secondary | ICD-10-CM | POA: Insufficient documentation

## 2020-11-11 DIAGNOSIS — Q2549 Other congenital malformations of aorta: Secondary | ICD-10-CM

## 2020-11-11 DIAGNOSIS — R7881 Bacteremia: Secondary | ICD-10-CM

## 2020-11-11 DIAGNOSIS — L97418 Non-pressure chronic ulcer of right heel and midfoot with other specified severity: Secondary | ICD-10-CM | POA: Diagnosis present

## 2020-11-11 DIAGNOSIS — B9561 Methicillin susceptible Staphylococcus aureus infection as the cause of diseases classified elsewhere: Secondary | ICD-10-CM

## 2020-11-11 DIAGNOSIS — E11621 Type 2 diabetes mellitus with foot ulcer: Secondary | ICD-10-CM | POA: Diagnosis present

## 2020-11-11 LAB — CBC WITH DIFFERENTIAL/PLATELET
Abs Immature Granulocytes: 0.11 10*3/uL — ABNORMAL HIGH (ref 0.00–0.07)
Basophils Absolute: 0 10*3/uL (ref 0.0–0.1)
Basophils Relative: 0 %
Eosinophils Absolute: 0.1 10*3/uL (ref 0.0–0.5)
Eosinophils Relative: 1 %
HCT: 30.3 % — ABNORMAL LOW (ref 39.0–52.0)
Hemoglobin: 8.9 g/dL — ABNORMAL LOW (ref 13.0–17.0)
Immature Granulocytes: 1 %
Lymphocytes Relative: 10 %
Lymphs Abs: 1.2 10*3/uL (ref 0.7–4.0)
MCH: 28.3 pg (ref 26.0–34.0)
MCHC: 29.4 g/dL — ABNORMAL LOW (ref 30.0–36.0)
MCV: 96.2 fL (ref 80.0–100.0)
Monocytes Absolute: 1.4 10*3/uL — ABNORMAL HIGH (ref 0.1–1.0)
Monocytes Relative: 12 %
Neutro Abs: 9.4 10*3/uL — ABNORMAL HIGH (ref 1.7–7.7)
Neutrophils Relative %: 76 %
Platelets: 510 10*3/uL — ABNORMAL HIGH (ref 150–400)
RBC: 3.15 MIL/uL — ABNORMAL LOW (ref 4.22–5.81)
RDW: 12.3 % (ref 11.5–15.5)
WBC: 12.2 10*3/uL — ABNORMAL HIGH (ref 4.0–10.5)
nRBC: 0 % (ref 0.0–0.2)

## 2020-11-11 LAB — URINALYSIS, ROUTINE W REFLEX MICROSCOPIC
Bacteria, UA: NONE SEEN
Bilirubin Urine: NEGATIVE
Glucose, UA: >=500 mg/dL — AB
Ketones, ur: NEGATIVE mg/dL
Nitrite: NEGATIVE
Protein, ur: NEGATIVE mg/dL
Specific Gravity, Urine: 1.02 (ref 1.005–1.030)
pH: 5 (ref 5.0–8.0)

## 2020-11-11 LAB — BASIC METABOLIC PANEL
Anion gap: 8 (ref 5–15)
BUN: 39 mg/dL — ABNORMAL HIGH (ref 8–23)
CO2: 23 mmol/L (ref 22–32)
Calcium: 8.4 mg/dL — ABNORMAL LOW (ref 8.9–10.3)
Chloride: 104 mmol/L (ref 98–111)
Creatinine, Ser: 2.16 mg/dL — ABNORMAL HIGH (ref 0.61–1.24)
GFR, Estimated: 32 mL/min — ABNORMAL LOW (ref >60)
Glucose, Bld: 231 mg/dL — ABNORMAL HIGH (ref 70–99)
Potassium: 5.3 mmol/L — ABNORMAL HIGH (ref 3.5–5.1)
Sodium: 135 mmol/L (ref 135–145)

## 2020-11-11 LAB — SARS CORONAVIRUS 2 (TAT 6-24 HRS): SARS Coronavirus 2: NEGATIVE

## 2020-11-11 LAB — C-REACTIVE PROTEIN: CRP: 21.2 mg/dL — ABNORMAL HIGH (ref <1.0)

## 2020-11-11 LAB — CBG MONITORING, ED: Glucose-Capillary: 134 mg/dL — ABNORMAL HIGH (ref 70–99)

## 2020-11-11 LAB — LACTIC ACID, PLASMA: Lactic Acid, Venous: 1.3 mmol/L (ref 0.5–1.9)

## 2020-11-11 IMAGING — CR DG FOOT COMPLETE 3+V*R*
3 series · 3 of 3 positions shown · non-contrast
Comparison: None.

CLINICAL DATA: Open wounds to the right foot and ankle.  Pain.

EXAM:
RIGHT FOOT COMPLETE - 3+ VIEW

[foot ap]
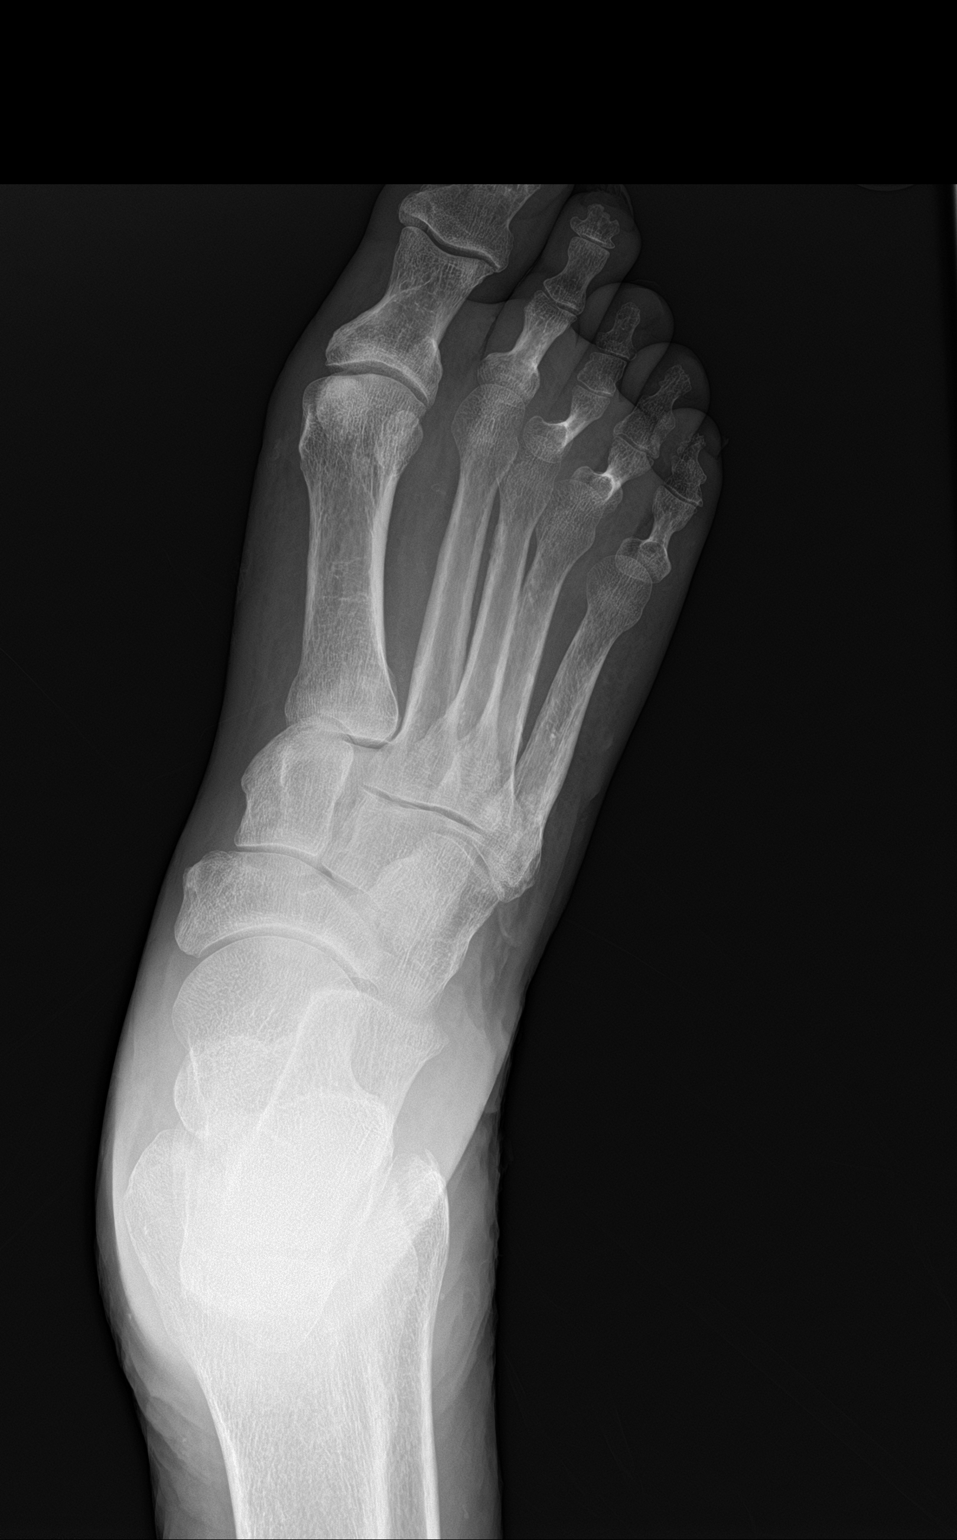

[foot obl]
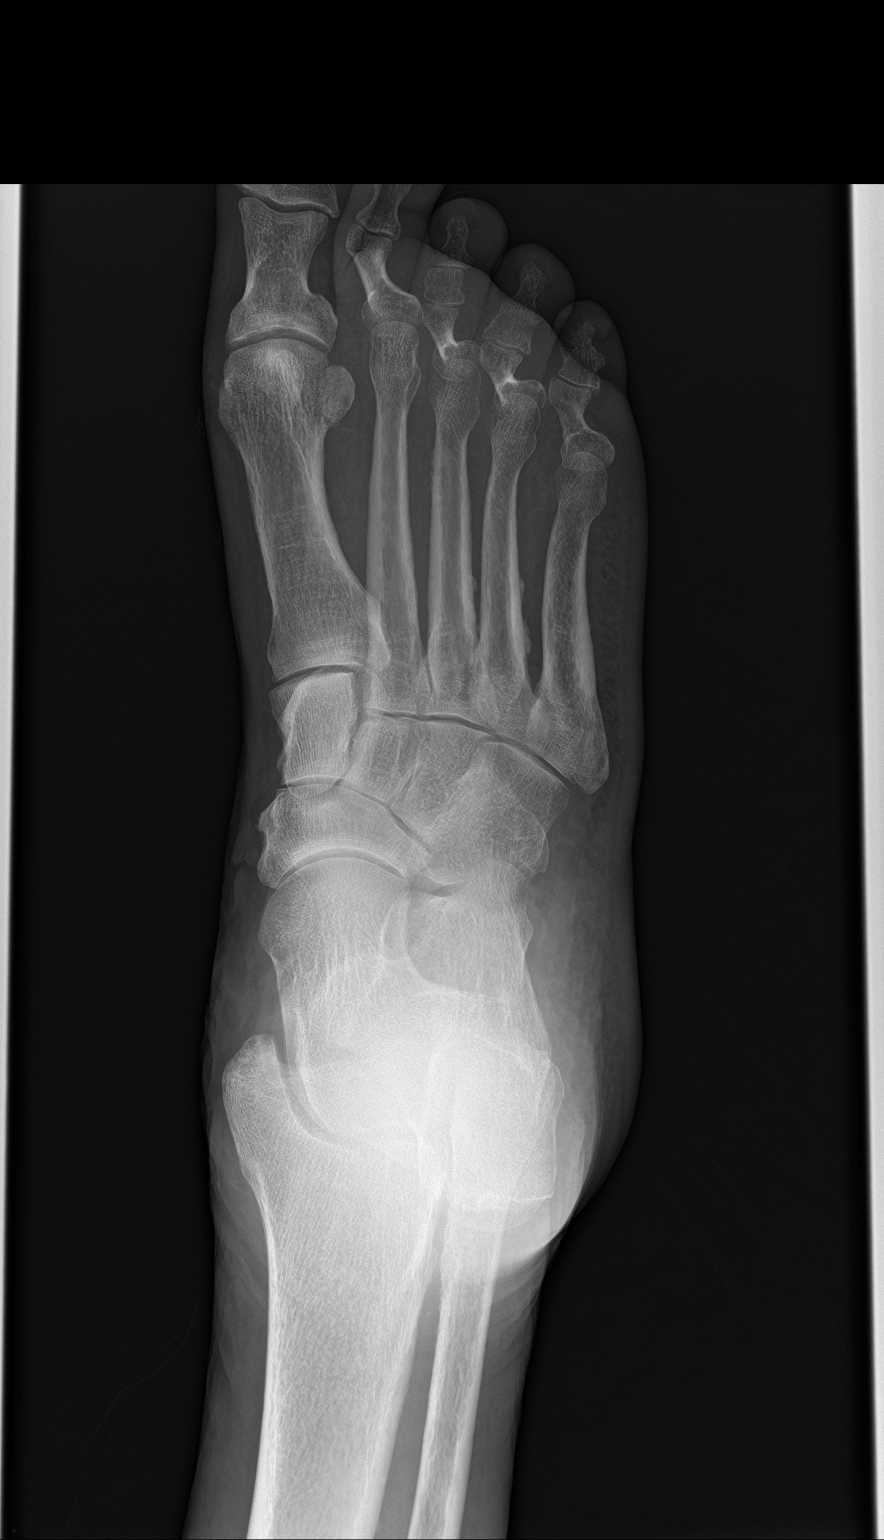

[foot lat]
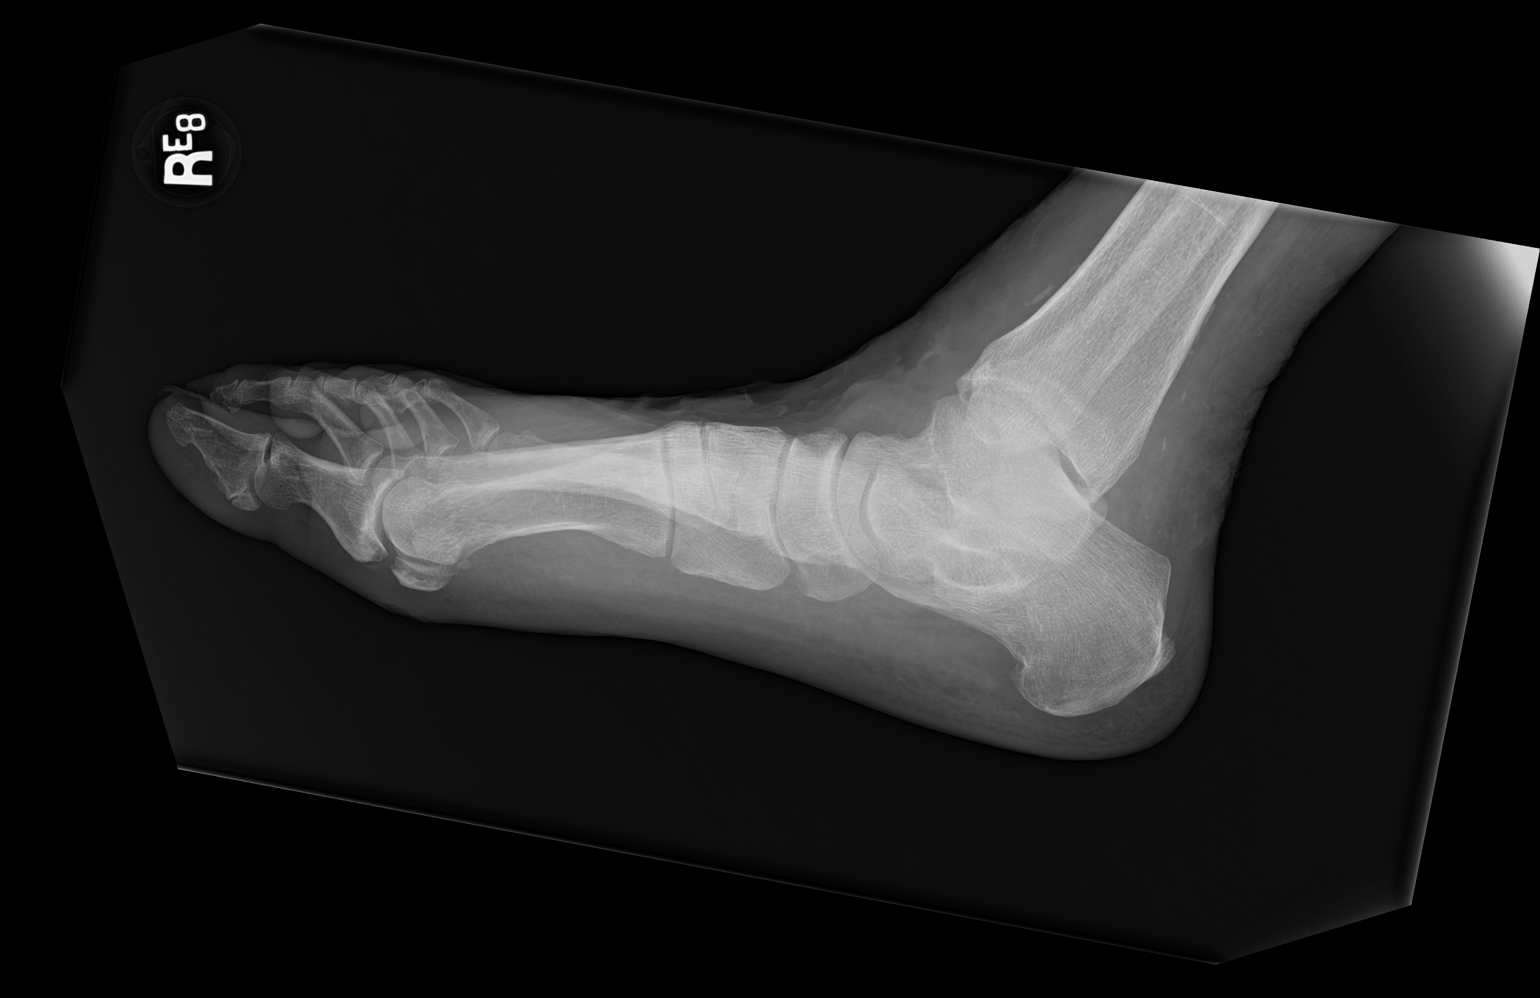

[3 of 3 positions shown; findings below may reference images not displayed]

FINDINGS: There is heterogeneous lucency and mild sclerosis involving the base
of the fifth metatarsal with indistinct cortex. There is overlying
soft tissue ulceration involving the lateral and dorsal aspects of
the foot and anterior ankle. No displaced fracture or dislocation is
identified.
IMPRESSION: Heterogeneous lucency and cortical disruption in the base of the
fifth metatarsal concerning for osteomyelitis.

## 2020-11-11 MED ORDER — PIPERACILLIN-TAZOBACTAM 3.375 G IVPB: 3.3750 g | Freq: Three times a day (TID) | INTRAVENOUS | Status: DC

## 2020-11-11 MED ORDER — HEPARIN SODIUM (PORCINE) 5000 UNIT/ML IJ SOLN
5000.0000 [IU] | Freq: Three times a day (TID) | INTRAMUSCULAR | Status: DC
  Administered 2020-11-11 – 2020-11-12 (×3): 5000 [IU] via SUBCUTANEOUS
  Filled 2020-11-11 (×4): qty 1

## 2020-11-11 MED ORDER — VANCOMYCIN HCL 1750 MG/350ML IV SOLN
1750.0000 mg | Freq: Once | INTRAVENOUS | Status: AC
  Administered 2020-11-11: 1750 mg via INTRAVENOUS
  Filled 2020-11-11: qty 350

## 2020-11-11 MED ORDER — VANCOMYCIN HCL IN DEXTROSE 1-5 GM/200ML-% IV SOLN
1000.0000 mg | INTRAVENOUS | Status: DC
  Filled 2020-11-11: qty 200

## 2020-11-11 MED ORDER — VANCOMYCIN VARIABLE DOSE PER UNSTABLE RENAL FUNCTION (PHARMACIST DOSING): Status: DC

## 2020-11-11 MED ORDER — ATORVASTATIN CALCIUM 80 MG PO TABS
80.0000 mg | ORAL_TABLET | Freq: Every day | ORAL | Status: DC
  Administered 2020-11-11 – 2020-11-18 (×7): 80 mg via ORAL
  Filled 2020-11-11 (×4): qty 1
  Filled 2020-11-11: qty 2
  Filled 2020-11-11 (×2): qty 1

## 2020-11-11 MED ORDER — SODIUM CHLORIDE 0.9 % IV BOLUS
1000.0000 mL | Freq: Once | INTRAVENOUS | Status: AC
  Administered 2020-11-11: 1000 mL via INTRAVENOUS

## 2020-11-11 MED ORDER — GADOBUTROL 1 MMOL/ML IV SOLN
9.0000 mL | Freq: Once | INTRAVENOUS | Status: AC | PRN
  Administered 2020-11-11: 9 mL via INTRAVENOUS

## 2020-11-11 MED ORDER — PIPERACILLIN-TAZOBACTAM 3.375 G IVPB 30 MIN
3.3750 g | Freq: Once | INTRAVENOUS | Status: AC
  Administered 2020-11-11: 3.375 g via INTRAVENOUS
  Filled 2020-11-11: qty 50

## 2020-11-11 MED ORDER — GABAPENTIN 300 MG PO CAPS
300.0000 mg | ORAL_CAPSULE | Freq: Every day | ORAL | Status: DC
  Administered 2020-11-11 – 2020-11-18 (×7): 300 mg via ORAL
  Filled 2020-11-11 (×7): qty 1

## 2020-11-11 MED ORDER — METOPROLOL SUCCINATE ER 25 MG PO TB24
12.5000 mg | ORAL_TABLET | Freq: Every evening | ORAL | Status: DC
  Administered 2020-11-11 – 2020-11-17 (×7): 12.5 mg via ORAL
  Filled 2020-11-11 (×7): qty 1

## 2020-11-11 MED ORDER — ACETAMINOPHEN 650 MG RE SUPP: 650.0000 mg | Freq: Four times a day (QID) | RECTAL | Status: DC | PRN

## 2020-11-11 MED ORDER — ACETAMINOPHEN 325 MG PO TABS
650.0000 mg | ORAL_TABLET | Freq: Four times a day (QID) | ORAL | Status: DC | PRN
  Administered 2020-11-12 – 2020-11-17 (×3): 650 mg via ORAL
  Filled 2020-11-11 (×3): qty 2

## 2020-11-11 MED ORDER — INSULIN ASPART 100 UNIT/ML IJ SOLN
0.0000 [IU] | Freq: Three times a day (TID) | INTRAMUSCULAR | Status: DC
  Administered 2020-11-11 – 2020-11-12 (×2): 1 [IU] via SUBCUTANEOUS
  Administered 2020-11-12: 2 [IU] via SUBCUTANEOUS
  Administered 2020-11-13 – 2020-11-14 (×2): 1 [IU] via SUBCUTANEOUS
  Administered 2020-11-14 – 2020-11-16 (×5): 2 [IU] via SUBCUTANEOUS
  Administered 2020-11-17 (×2): 1 [IU] via SUBCUTANEOUS
  Administered 2020-11-18: 2 [IU] via SUBCUTANEOUS

## 2020-11-11 MED ORDER — LINAGLIPTIN 5 MG PO TABS
5.0000 mg | ORAL_TABLET | Freq: Every day | ORAL | Status: DC
  Administered 2020-11-12 – 2020-11-17 (×7): 5 mg via ORAL
  Filled 2020-11-11 (×8): qty 1

## 2020-11-11 MED ORDER — METRONIDAZOLE 500 MG/100ML IV SOLN
500.0000 mg | Freq: Three times a day (TID) | INTRAVENOUS | Status: DC
  Administered 2020-11-11 – 2020-11-12 (×3): 500 mg via INTRAVENOUS
  Filled 2020-11-11 (×4): qty 100

## 2020-11-11 MED ORDER — SODIUM CHLORIDE 0.9 % IV SOLN
2.0000 g | Freq: Two times a day (BID) | INTRAVENOUS | Status: DC
  Administered 2020-11-11 – 2020-11-12 (×2): 2 g via INTRAVENOUS
  Filled 2020-11-11 (×2): qty 2

## 2020-11-11 NOTE — Progress Notes (Signed)
FPTS Brief Progress Note  S: Sleeping.    O: BP (!) 141/64   Pulse 78   Temp 99.2 F (37.3 C) (Oral)   Resp 19   Wt 94 kg   SpO2 96%   BMI 24.57 kg/m   General: Sleeping soundly in ED hallway, no acute distress. Age appropriate. Respiratory:  normal effort Extremities: Right LE with clean dressing   A/P: Osteomyelitis of the right foot - Orders reviewed. Labs for AM ordered, which was adjusted as needed.  - f/u ABIs  Autry-Lott, Antion Andres, DO 11/11/2020, 10:56 PM PGY-3, [facility] Night Resident  Please page [phone] with questions.

## 2020-11-11 NOTE — Consult Note (Addendum)
Hospital Consult    Reason for Consult:  RLE wound Requesting Physician:  ED MRN #:  [id number]  History of Present Illness: This is a 70 y.o. male with past medical history significant for hypertension, hyperlipidemia, diabetes mellitus.  He presents to the ED with worsening right foot wound.  Surgical history significant for left toe amputation with subsequent left below the knee amputation by Dr. Lajoyce Corners in November 2021.  He states current right foot and ankle wound has been present for at least 3 months.  He is only recently started to have pain with plantar and dorsiflexion and ambulation.  Work-up included right foot x-ray demonstrating osteomyelitis of the base of the fifth toe.  He is able to walk with a prosthetic without a cane or walker.  He denies any claudication of the right calf with ambulation.  He is a former smoker.  He is on a statin daily.  He is legally blind.  Past Medical History:  Diagnosis Date   Allergy    Blindness - both eyes 1967   Basketball injury   Diabetes mellitus    Hernia    Hyperlipidemia    Hypertension    Left hip pain 04/10/2013   Marfan syndrome    Osteomyelitis of foot, left, acute (HCC) 12/26/2018   Right knee pain 10/04/2017    Past Surgical History:  Procedure Laterality Date   AMPUTATION Left 12/28/2018   Procedure: LEFT FOOT 3RD RAY AMPUTATION;  Surgeon: Nadara Mustard, MD;  Location: [facility] OR;  Service: Orthopedics;  Laterality: Left;   AMPUTATION Left 01/03/2020   Procedure: LEFT BELOW KNEE AMPUTATION;  Surgeon: Nadara Mustard, MD;  Location: [facility] OR;  Service: Orthopedics;  Laterality: Left;   APPENDECTOMY     INGUINAL HERNIA REPAIR     Right 1970s,    Left 2011 by Dr Job Founds HERNIA REPAIR  08/25/10   recurrent left   PENILE PROSTHESIS IMPLANT  2000s   [facility], Dr Brunilda Payor   RETINAL DETACHMENT SURGERY  1970   Rotator Cuff Repair  2009   Left side, Dr Elita Quick    ROTATOR CUFF REPAIR  2010-2011?   right side    No Known  Allergies  Prior to Admission medications   Medication Sig Start Date End Date Taking? Authorizing Provider  acetaminophen (TYLENOL) 325 MG tablet Take 2 tablets (650 mg total) by mouth every 6 (six) hours as needed for mild pain (or Fever >/= 101). 01/01/19   Dollene Cleveland, DO  atorvastatin (LIPITOR) 80 MG tablet Take 1 tablet (80 mg total) by mouth daily. 09/19/20   Lilland, Alana, DO  augmented betamethasone dipropionate (DIPROLENE-AF) 0.05 % cream APPLY TOPICALLY TWICE DAILY 10/14/20   Lilland, Alana, DO  cholecalciferol (VITAMIN D3) 25 MCG (1000 UT) tablet Take 1,000 Units by mouth at bedtime.    [provider]  empagliflozin (JARDIANCE) 10 MG TABS tablet Take 1 tablet (10 mg total) by mouth daily. 09/19/20   Lilland, Alana, DO  enalapril (VASOTEC) 20 MG tablet Take 1 tablet (20 mg total) by mouth daily. 09/19/20   Lilland, Alana, DO  gabapentin (NEURONTIN) 100 MG capsule Take 3 capsules (300 mg total) by mouth daily. 09/19/20 03/18/21  Lilland, Alana, DO  linagliptin (TRADJENTA) 5 MG TABS tablet Take 1 tablet (5 mg total) by mouth at bedtime. 09/19/20   Lilland, Alana, DO  metoprolol succinate (TOPROL-XL) 25 MG 24 hr tablet Take 0.5 tablets (12.5 mg total) by mouth every evening. 09/19/20  Lilland, Alana, DO  PRODIGY NO CODING BLOOD GLUC test strip USE TO CHECK BLOOD SUGAR  THREE TIMES DAILY OR AS  DIRECTED 07/08/20   [name], MD  Prodigy Twist Top Lancets 28G MISC CHECK BLOOD SUGAR 3 TIMES  DAILY 03/26/20   [name], MD  triamcinolone cream (KENALOG) 0.1 % APPLY TOPICALLY TWICE DAILY AS NEEDED FOR DRY SKIN 10/14/20   Evelena Leyden, DO    Social History   Socioeconomic History   Marital status: Married    Spouse name: DEREN DEGRAZIA   Number of children: 0   Years of education: 12th    Highest education level: Not on file  Occupational History   Occupation: industrial sewing    Employer: [employer]  Tobacco Use   Smoking status: Former    Types: Cigarettes     Quit date: 02/14/1984    Years since quitting: 36.7   Smokeless tobacco: Never  Substance and Sexual Activity   Alcohol use: No    Comment: Quit in 10/1983   Drug use: No   Sexual activity: Not on file  Other Topics Concern   Not on file  Social History Narrative   Lives in [place] with wife, KLYE BESECKER   Blind.  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   Exercise: Pt has no regular exercise routine.   Seatbelts: Pt reports wearing seatbelt when in vehicles.    Wynelle Link Exposure/Protection:    Hobbies: Proofreader, walking, sports         Social Determinants of Corporate investment banker Strain: Not on [employer] Insecurity: Not on file  Transportation Needs: Not on file  Physical Activity: Not on file  Stress: Not on file  Social Connections: Not on file  Intimate Partner Violence: Not on file     Family History  Problem Relation Age of Onset   Heart disease Mother    Breast cancer Sister     ROS: Otherwise negative unless mentioned in HPI  Physical Examination  Vitals:   11/11/20 0945 11/11/20 1015  BP: (!) 142/57 (!) 133/54  Pulse: 76 72  Resp: 20 19  Temp:    SpO2: 97% 96%   Body mass index is 24.57 kg/m.  General:  WDWN in NAD Gait: Not observed HENT: WNL, normocephalic Pulmonary: normal non-labored breathing, without Rales, rhonchi,  wheezing Cardiac: regular Abdomen:  soft, NT/ND, no masses Skin: without rashes Vascular Exam/Pulses: symmetrical radial pulses Extremities: Extensive right dorsal foot and ankle wound with exposed necrotic tendon, purulent drainage; maggots present tween first and second toe space Musculoskeletal: no muscle wasting or  atrophy  Neurologic: A&O X 3;  No focal weakness or paresthesias are detected; speech is fluent/normal Psychiatric:  The pt has Normal affect. Lymph:  Unremarkable     CBC    Component Value Date/Time   WBC 12.2 (H) 11/11/2020 0902   RBC 3.15 (L) 11/11/2020 0902   HGB 8.9 (L) 11/11/2020 0902   HCT 30.3 (L) 11/11/2020 0902   PLT 510 (H) 11/11/2020 0902   MCV 96.2 11/11/2020 0902   MCH 28.3 11/11/2020  0902   MCHC 29.4 (L) 11/11/2020 0902   RDW 12.3 11/11/2020 0902   LYMPHSABS 1.2 11/11/2020 0902   MONOABS 1.4 (H) 11/11/2020 0902   EOSABS 0.1 11/11/2020 0902   BASOSABS 0.0 11/11/2020 0902    BMET    Component Value Date/Time   NA 135 11/11/2020 0902   NA 143 09/26/2019 0935   K 5.3 (H) 11/11/2020 0902   CL 104 11/11/2020 0902   CO2 23 11/11/2020 0902   GLUCOSE 231 (H) 11/11/2020 0902   BUN 39 (H) 11/11/2020 0902   BUN 26 09/26/2019 0935   CREATININE 2.16 (H) 11/11/2020 0902   CREATININE 1.35 (H) 12/21/2019 1103   CALCIUM 8.4 (L) 11/11/2020 0902   GFRNONAA 32 (L) 11/11/2020 0902   GFRNONAA 50 (L) 05/17/2015 1519   GFRAA 55 (L) 09/26/2019 0935   GFRAA 58 (L) 05/17/2015 1519    COAGS: Lab Results  Component Value Date   INR 1.1 01/01/2020   INR 1.2 12/28/2018   INR 1.1 12/25/2018     Non-Invasive Vascular Imaging:   Right lower extremity arterial duplex and ABI are pending    ASSESSMENT/PLAN: This is a 70 y.o. male with extensive tissue loss of right foot and ankle  -Extensive wound of right dorsal foot and ankle with exposed necrotic tendon.  Work-up included plain film demonstrating osteomyelitis of the fifth toe metatarsal.  Even with intervention to improve blood flow, tissue loss is seemingly too extensive at this point.  Agree with broad spectrum antibiotics.  We will start with noninvasive studies including ABI and right lower extremity arterial duplex.  This can also help determine level of amputation.  Dr. Lajoyce Corners has been consulted.  On-call vascular  surgeon Dr. Karin Lieu will evaluate the patient later today and provide further treatment plans.  VASCULAR STAFF ADDENDUM: I have independently interviewed and examined the patient. I agree with the above.   In brief, 70 year old male with history of PAD, diabetes, left below-knee amputation-Dr. Lajoyce Corners, who presents with a several month history of nonhealing wounds to the right foot.  He has been blind since 1967 after a basketball accident left him with bilateral detached retinas.  He worked full-time for 30 years at [employer] for the [employer]. He is now retired.  He has ambulated on his below-knee amputation without the need for a walker until a few days ago when the pain in the right foot sidelined him.  He denies fevers, chills.  No signs of ascending infection.    On exam, nonpalpable pulse at the foot, palpable femoral arteries bilaterally When examining the foot, there are maggots in the subcutaneous tissue with a tract from the dorsum of the foot to the webspace between the first and second toes. I placed a Betadine over the patient's wounds. No concern for life-threatening infection requiring immediate OR guillotine amputation. I will discuss the patient with Dr. Lajoyce Corners.  If he deems the limb salvageable, I will happily provide arterial angiogram in an effort to improve right lower extremity perfusion.  Fara Olden, MD [facility] Phone Number: [phone] 11/11/2020 4:44 PM     Emilie Rutter PA-C [facility] [phone]

## 2020-11-11 NOTE — ED Triage Notes (Signed)
Patient BIB [facility] from home, complaint of generalized weakness x2 months. Just not getting better and decided to come in today. Pt has open wound right ankle that appears infected. VSS. NAD.

## 2020-11-11 NOTE — ED Provider Notes (Signed)
[facility] EMERGENCY DEPARTMENT Provider Note   CSN: [id number] Arrival date & time: 11/11/20  4098     History Chief Complaint  Patient presents with   Weakness   Wound Infection    NACHMAN SUNDT is a 70 y.o. male PMHx below presetned with c/o generalized weakness and right foot wound. Patient states that for the past two months he has been experiencing difficulty standing from a sitting position. When able to stand, he has to sit right back down due to weakness, feels associated lightheaded. Patient reports intermittent chills.  Patient denies: -recent changes to medications. -any recent falls or syncopal episodes -associated numbness/tingling -any recent weight changes -urinary changes, wears depends -fever or recent illness -appetite changes   Patient is blind in both eyes since 1967 caused by retinal detachement. Patient lives with wife who is also blind. Denies HH aids or any other assistance at home  Patient he has a right foot wound that began as a "callus" at the dorsum of the right foot and progressively worsened over the past two months. Denies injury or trauma to the right foot. Does not change dressing often. States use silver nitrate sometimes. Patient experiences pain in the right foot pain when standing or elevating the foot.    Weakness Associated symptoms: no chest pain, no diarrhea, no dizziness, no dysuria, no fever, no headaches, no myalgias, no nausea, no shortness of breath and no vomiting       Past Medical History:  Diagnosis Date   Allergy    Blindness - both eyes 1967   Basketball injury   Diabetes mellitus    Hernia    Hyperlipidemia    Hypertension    Left hip pain 04/10/2013   Marfan syndrome    Osteomyelitis of foot, left, acute (HCC) 12/26/2018   Right knee pain 10/04/2017    Patient Active Problem List   Diagnosis Date Noted   Osteomyelitis (HCC) 11/11/2020   Diabetic peripheral neuropathy associated with  type 2 diabetes mellitus (HCC) 11/10/2012   Marfan's syndrome 08/08/2012   Atopic dermatitis 02/16/2012   BMI 24.0-24.9, adult 07/05/2011   Allergic rhinitis due to allergen 07/03/2011   Venous insufficiency of leg 04/03/2011   Blindness 07/23/2010   DM (diabetes mellitus), type 2 with neurological complications (HCC) 06/13/2010   Hypertension 06/13/2010   Hyperlipidemia 06/13/2010    Past Surgical History:  Procedure Laterality Date   AMPUTATION Left 12/28/2018   Procedure: LEFT FOOT 3RD RAY AMPUTATION;  Surgeon: Nadara Mustard, MD;  Location: [facility] OR;  Service: Orthopedics;  Laterality: Left;   AMPUTATION Left 01/03/2020   Procedure: LEFT BELOW KNEE AMPUTATION;  Surgeon: Nadara Mustard, MD;  Location: [facility] OR;  Service: Orthopedics;  Laterality: Left;   APPENDECTOMY     INGUINAL HERNIA REPAIR     Right 1970s,    Left 2011 by Dr Job Founds HERNIA REPAIR  08/25/10   recurrent left   PENILE PROSTHESIS IMPLANT  2000s   [facility], Dr Brunilda Payor   RETINAL DETACHMENT SURGERY  1970   Rotator Cuff Repair  2009   Left side, Dr Elita Quick    ROTATOR CUFF REPAIR  2010-2011?   right side       Family History  Problem Relation Age of Onset   Heart disease Mother    Breast cancer Sister     Social History   Tobacco Use   Smoking status: Former    Types: Cigarettes    Quit  date: 02/14/1984    Years since quitting: 36.7   Smokeless tobacco: Never  Substance Use Topics   Alcohol use: No    Comment: Quit in 10/1983   Drug use: No    Home Medications Prior to Admission medications   Medication Sig Start Date End Date Taking? Authorizing Provider  acetaminophen (TYLENOL) 325 MG tablet Take 2 tablets (650 mg total) by mouth every 6 (six) hours as needed for mild pain (or Fever >/= 101). 01/01/19   Dollene Cleveland, DO  atorvastatin (LIPITOR) 80 MG tablet Take 1 tablet (80 mg total) by mouth daily. 09/19/20   Lilland, Alana, DO  augmented betamethasone dipropionate (DIPROLENE-AF)  0.05 % cream APPLY TOPICALLY TWICE DAILY 10/14/20   Lilland, Alana, DO  cholecalciferol (VITAMIN D3) 25 MCG (1000 UT) tablet Take 1,000 Units by mouth at bedtime.    [provider]  empagliflozin (JARDIANCE) 10 MG TABS tablet Take 1 tablet (10 mg total) by mouth daily. 09/19/20   Lilland, Alana, DO  enalapril (VASOTEC) 20 MG tablet Take 1 tablet (20 mg total) by mouth daily. 09/19/20   Lilland, Alana, DO  gabapentin (NEURONTIN) 100 MG capsule Take 3 capsules (300 mg total) by mouth daily. 09/19/20 03/18/21  Lilland, Alana, DO  linagliptin (TRADJENTA) 5 MG TABS tablet Take 1 tablet (5 mg total) by mouth at bedtime. 09/19/20   Lilland, Alana, DO  metoprolol succinate (TOPROL-XL) 25 MG 24 hr tablet Take 0.5 tablets (12.5 mg total) by mouth every evening. 09/19/20   Lilland, Alana, DO  PRODIGY NO CODING BLOOD GLUC test strip USE TO CHECK BLOOD SUGAR  THREE TIMES DAILY OR AS  DIRECTED 07/08/20   [name], MD  Prodigy Twist Top Lancets 28G MISC CHECK BLOOD SUGAR 3 TIMES  DAILY 03/26/20   [name], MD  triamcinolone cream (KENALOG) 0.1 % APPLY TOPICALLY TWICE DAILY AS NEEDED FOR DRY SKIN 10/14/20   Lilland, Alana, DO    Allergies    Patient has no known allergies.  Review of Systems   Review of Systems  Constitutional:  Positive for chills and fatigue. Negative for appetite change and fever.  Respiratory:  Negative for shortness of breath.   Cardiovascular:  Negative for chest pain.  Gastrointestinal:  Negative for constipation, diarrhea, nausea and vomiting.  Genitourinary:  Negative for difficulty urinating and dysuria.  Musculoskeletal:  Negative for myalgias.  Skin:  Positive for wound (right foot).  Neurological:  Positive for weakness and light-headedness. Negative for dizziness, syncope, numbness and headaches.   Physical Exam Updated Vital Signs BP 140/72   Pulse 71   Temp 99.2 F (37.3 C) (Oral)   Resp (!) 21   Wt 94 kg   SpO2 98%   BMI 24.57 kg/m   Physical  Exam Constitutional:      General: He is not in acute distress.    Comments: Patient is covered in layers of blankets  HENT:     Head: Normocephalic and atraumatic.  Cardiovascular:     Rate and Rhythm: Normal rate.     Pulses:          Posterior tibial pulses are detected w/ Doppler on the right side.  Pulmonary:     Effort: Pulmonary effort is normal.     Breath sounds: Normal breath sounds and air entry.     Comments: Lung sounds appreciated anteriorly Musculoskeletal:     Right lower leg: No edema.     Left lower leg: No edema.  Left Lower Extremity: Left leg is amputated below knee.  Feet:     Comments: Right foot wound- appears necrotic; maggots present within the wound. Malodorous  Skin:    General: Skin is warm.     Comments: Dry flaky skin  Neurological:     General: No focal deficit present.     Mental Status: He is alert.  Psychiatric:        Behavior: Behavior normal. Behavior is cooperative.     ED Results / Procedures / Treatments   Labs (all labs ordered are listed, but only abnormal results are displayed) Labs Reviewed  CBC WITH DIFFERENTIAL/PLATELET - Abnormal; Notable for the following components:      Result Value   WBC 12.2 (*)    RBC 3.15 (*)    Hemoglobin 8.9 (*)    HCT 30.3 (*)    MCHC 29.4 (*)    Platelets 510 (*)    Neutro Abs 9.4 (*)    Monocytes Absolute 1.4 (*)    Abs Immature Granulocytes 0.11 (*)    All other components within normal limits  BASIC METABOLIC PANEL - Abnormal; Notable for the following components:   Potassium 5.3 (*)    Glucose, Bld 231 (*)    BUN 39 (*)    Creatinine, Ser 2.16 (*)    Calcium 8.4 (*)    GFR, Estimated 32 (*)    All other components within normal limits  C-REACTIVE PROTEIN - Abnormal; Notable for the following components:   CRP 21.2 (*)    All other components within normal limits  CULTURE, BLOOD (ROUTINE X 2)  CULTURE, BLOOD (ROUTINE X 2)  SARS CORONAVIRUS 2 (TAT 6-24 HRS)  LACTIC ACID,  PLASMA  URINALYSIS, ROUTINE W REFLEX MICROSCOPIC    EKG None  Radiology DG Foot Complete Right  Result Date: 11/11/2020 CLINICAL DATA:  Open wounds to the right foot and ankle.  Pain. EXAM: RIGHT FOOT COMPLETE - 3+ VIEW COMPARISON:  None. FINDINGS: There is heterogeneous lucency and mild sclerosis involving the base of the fifth metatarsal with indistinct cortex. There is overlying soft tissue ulceration involving the lateral and dorsal aspects of the foot and anterior ankle. No displaced fracture or dislocation is identified. IMPRESSION: Heterogeneous lucency and cortical disruption in the base of the fifth metatarsal concerning for osteomyelitis. Electronically Signed   By: Sebastian Ache M.D.   On: 11/11/2020 09:51    Procedures Procedures   Medications Ordered in ED Medications  vancomycin variable dose per unstable renal function (pharmacist dosing) (has no administration in time range)  atorvastatin (LIPITOR) tablet 80 mg (has no administration in time range)  metoprolol succinate (TOPROL-XL) 24 hr tablet 12.5 mg (has no administration in time range)  linagliptin (TRADJENTA) tablet 5 mg (has no administration in time range)  gabapentin (NEURONTIN) capsule 300 mg (has no administration in time range)  heparin injection 5,000 Units (has no administration in time range)  acetaminophen (TYLENOL) tablet 650 mg (has no administration in time range)    Or  acetaminophen (TYLENOL) suppository 650 mg (has no administration in time range)  metroNIDAZOLE (FLAGYL) IVPB 500 mg (has no administration in time range)  sodium chloride 0.9 % bolus 1,000 mL (has no administration in time range)  insulin aspart (novoLOG) injection 0-9 Units (has no administration in time range)  piperacillin-tazobactam (ZOSYN) IVPB 3.375 g (0 g Intravenous Stopped 11/11/20 1006)  vancomycin (VANCOREADY) IVPB 1750 mg/350 mL (1,750 mg Intravenous New Bag/Given 11/11/20 1006)    ED Course  I have reviewed the triage  vital signs and the nursing notes.  Pertinent labs & imaging results that were available during my care of the patient were reviewed by me and considered in my medical decision making (see chart for details).    MDM Rules/Calculators/A&P                          Generalized Weakness Began two months ago; associated with lightheaded, denies any syncopal episodes. Patient reports difficulty standing from sitting position and standing for long periods of time. BMP significant for hyperkalemia.  Right foot wound Patient presents with severe malodorous necrotic maggot infested appearing foot wound that he noticed began as a callus two months ago. Denies any injury or trauma to the foot. Denies pain associated with the right foot wound. PT pulse appreciated on doppler. CXR of foot revealed possible osteomyelitis of the 5th metatarsal Ortho has seen patient. MRI and ABI ordered. Vascular consulted for evaluation. Vancomycin and Zosyn given. Initial labs: CBC significant for leukocytosis; BMP significant for AKI, mild hyperkalemia; CRP elevated. Family medicine admitting to service.  Final Clinical Impression(s) / ED Diagnoses Final diagnoses:  Wound infection  Weakness  Leukocytosis, unspecified type    Rx / DC Orders ED Discharge Orders     None        Dellis Filbert, MD 11/11/20 1335    Milagros Loll, MD 11/12/20 [phone]

## 2020-11-11 NOTE — H&P (Addendum)
Family Medicine Teaching [facility] Admission History and Physical Service Pager: [phone]  Patient name: Chad Hall Medical record number: [id number] Date of birth: [birth year] Age: 70 y.o. Gender: male  Primary Care Provider: Evelena Leyden, DO Consultants: Ortho, vascular Code Status: Full code Preferred Emergency Contact: Spouse Contact Information     Name Relation Home Work Gideon L Spouse [phone]  [phone]   Whalen, Trompeter   [phone]        Chief Complaint: Foot wound  Assessment and Plan: ODIN MARIANI is a 70 y.o. male presenting with foot wound and concern for osteomyelitis. PMH is significant for blindness in both eyes, type 2 diabetes, hypertension, hyperlipidemia  Osteomyelitis of the right foot: Patient with right foot wound which has been going on for some time and only recently became more more painful.  Patient previously had a left third ray amputation on 12/28/2018 for osteomyelitis. In the emergency department patient was afebrile with mildly elevated white blood cell count of 12.  Initial lactic acid of 1.3.  CRP elevated at 21.2. Patient with significant AKI with creatinine of 2.16, previously within normal limits.  X-ray showed findings concerning for osteomyelitis at the base of the fifth metatarsal.  Patient was started on vancomycin and Zosyn and orthopedics was consulted. -Admit to MedSurg, attending Dr. Jennette Kettle -Orthopedic surgery on board, appreciate recommendations - vascular surgery on board as well, appreciate recommendations -MRI pending -ABIs -Follow-up blood cultures -We will switch antibiotics to Vanc, cefepime, Flagyl to reduce renal stress -Vitals per floor -PT/OT once any surgical procedures are complete -A.m. CBC/BMP  AKI: Initial creatinine of 2.16, recent creatinine of 1.0-1.1.  Likely secondary to prerenal with a BUN of 39 and greater than 20 BUN to creatinine ratio.  Patient with dry  mucous membranes again suggesting the prerenal was the cause of his AKI. -IV fluids-we will do 1 L fluid bolus followed by maintenance fluids if the patient remains n.p.o. -A.m. CMP -We will hold patient's lisinopril -Avoid nephrotoxic agents if possible -We will discontinue Zosyn and use Vanc, cefepime, Flagyl for antibiotic coverage  Fatigue  generalized weakness: Been going on for a few months per patient, associated with some generalized weakness and sometimes dizziness immediately upon standing. Denies falls, denies fevers or poor appetite. Lives at home with his spouse who is blind (patient also with history of blindness). Denies new medication changes but states his foot started becoming painful about 2-3 months ago. Denies syncope, chest pains or shortness of breath associated with his fatigue and generalized weakness. - Orthostatic vitals - continuous cardiac monitoring - AM labs including albumin  Type 2 diabetes Home medications include Jardiance 10 mg daily, Tradjenta 5 mg nightly.  Most recent A1c 5.3 on 01/11/2020.  -We will hold Jardiance -Continue Tradjenta -Sliding scale -A.m. A1c  Hypertension: Home medications include enalapril 20 mg daily.  Patient also takes metoprolol succinate 12.5 mg daily.  Blood pressure range of 125-150/50s-60s in the ED. -Continue home metoprolol, hold home enalapril  Hyperkalemia: Initial potassium 5.3. - AM CMP  Diabetic neuropathy Home medications include gabapentin 300 mg daily -Continue home gabapentin  HFpEF Most recent echo 08/24/2019 with grade 1 diastolic dysfunction normal left ventricular systolic function with ejection fraction 60 to 65%.  No regional wall abnormalities at that time. -Continue to monitor  Thoracic aortic aneurysm: Has been followed with imaging over the past couple of years.  Most recent CT chest on 09/04/2019 showing a stable appearing dilation at 3.8 cm  in the ascending aorta.  History of blindness: Per  documentation patient is blind in both eyes since 1967 due to retinal detachment.  He lives at home with his spouse who is also blind. -Social work consult as patient may have additional support needs at home  FEN/GI: Heart healthy/carb modified Prophylaxis: Heparin subq  Disposition: Admit to MedSurg  History of Present Illness:  Chad Hall is a 70 y.o. male presenting with fatigue and foot pain. He states he has felt more fatigued than usual for about 3 weeks. He has had some difficulty standing lately due to foot pain and low energy. He has had foot pain in his right foot for about 3 months which has been getting worse. He lives at home with his spouse.   He checks his blood pressure occasionally at home and gets numbers in the 140s systolically. He denies dizziness but states he sometimes get light headed when he stands up. Denies any falls. Denies any new medications.   Alcohol: Occasional glass of wine, 1-2 per month Tobacco: Stopped smoking in 1985. Was doing about a pack per day before that Drugs: none  Review Of Systems: Per HPI with the following additions:   Review of Systems  Constitutional:  Negative for fever.  HENT:  Negative for congestion.   Respiratory:  Negative for shortness of breath.   Cardiovascular:  Negative for chest pain.  Gastrointestinal:  Negative for abdominal pain.  Genitourinary:  Negative for difficulty urinating and dysuria.  Musculoskeletal:  Positive for gait problem (due to pain).  Neurological:  Positive for light-headedness (sometimes when stands up). Negative for dizziness.    Patient Active Problem List   Diagnosis Date Noted   Osteomyelitis (HCC) 11/11/2020   Diabetic peripheral neuropathy associated with type 2 diabetes mellitus (HCC) 11/10/2012   Marfan's syndrome 08/08/2012   Atopic dermatitis 02/16/2012   BMI 24.0-24.9, adult 07/05/2011   Allergic rhinitis due to allergen 07/03/2011   Venous insufficiency of leg 04/03/2011    Blindness 07/23/2010   DM (diabetes mellitus), type 2 with neurological complications (HCC) 06/13/2010   Hypertension 06/13/2010   Hyperlipidemia 06/13/2010    Past Medical History: Past Medical History:  Diagnosis Date   Allergy    Blindness - both eyes 1967   Basketball injury   Diabetes mellitus    Hernia    Hyperlipidemia    Hypertension    Left hip pain 04/10/2013   Marfan syndrome    Osteomyelitis of foot, left, acute (HCC) 12/26/2018   Right knee pain 10/04/2017    Past Surgical History: Past Surgical History:  Procedure Laterality Date   AMPUTATION Left 12/28/2018   Procedure: LEFT FOOT 3RD RAY AMPUTATION;  Surgeon: Nadara Mustard, MD;  Location: [facility] OR;  Service: Orthopedics;  Laterality: Left;   AMPUTATION Left 01/03/2020   Procedure: LEFT BELOW KNEE AMPUTATION;  Surgeon: Nadara Mustard, MD;  Location: [facility] OR;  Service: Orthopedics;  Laterality: Left;   APPENDECTOMY     INGUINAL HERNIA REPAIR     Right 1970s,    Left 2011 by Dr Job Founds HERNIA REPAIR  08/25/10   recurrent left   PENILE PROSTHESIS IMPLANT  2000s   [facility], Dr Brunilda Payor   RETINAL DETACHMENT SURGERY  1970   Rotator Cuff Repair  2009   Left side, Dr Elita Quick    ROTATOR CUFF REPAIR  2010-2011?   right side    Social History: Social History   Tobacco Use   Smoking status:  Former    Types: Cigarettes    Quit date: 02/14/1984    Years since quitting: 36.7   Smokeless tobacco: Never  Substance Use Topics   Alcohol use: No    Comment: Quit in 10/1983   Drug use: No   Additional social history:   Please also refer to relevant sections of EMR.  Family History: Family History  Problem Relation Age of Onset   Heart disease Mother    Breast cancer Sister     Allergies and Medications: No Known Allergies No current facility-administered medications on file prior to encounter.   Current Outpatient Medications on File Prior to Encounter  Medication Sig Dispense Refill   acetaminophen  (TYLENOL) 325 MG tablet Take 2 tablets (650 mg total) by mouth every 6 (six) hours as needed for mild pain (or Fever >/= 101). 30 tablet 0   atorvastatin (LIPITOR) 80 MG tablet Take 1 tablet (80 mg total) by mouth daily. 90 tablet 3   augmented betamethasone dipropionate (DIPROLENE-AF) 0.05 % cream APPLY TOPICALLY TWICE DAILY 100 g 2   cholecalciferol (VITAMIN D3) 25 MCG (1000 UT) tablet Take 1,000 Units by mouth at bedtime.     empagliflozin (JARDIANCE) 10 MG TABS tablet Take 1 tablet (10 mg total) by mouth daily. 90 tablet 3   enalapril (VASOTEC) 20 MG tablet Take 1 tablet (20 mg total) by mouth daily. 90 tablet 3   gabapentin (NEURONTIN) 100 MG capsule Take 3 capsules (300 mg total) by mouth daily. 270 capsule 1   linagliptin (TRADJENTA) 5 MG TABS tablet Take 1 tablet (5 mg total) by mouth at bedtime. 90 tablet 3   metoprolol succinate (TOPROL-XL) 25 MG 24 hr tablet Take 0.5 tablets (12.5 mg total) by mouth every evening. 45 tablet 3   PRODIGY NO CODING BLOOD GLUC test strip USE TO CHECK BLOOD SUGAR  THREE TIMES DAILY OR AS  DIRECTED 300 strip 3   Prodigy Twist Top Lancets 28G MISC CHECK BLOOD SUGAR 3 TIMES  DAILY 300 each 3   triamcinolone cream (KENALOG) 0.1 % APPLY TOPICALLY TWICE DAILY AS NEEDED FOR DRY SKIN 270 g 0    Objective: BP (!) 133/54   Pulse 72   Temp 99.2 F (37.3 C) (Oral)   Resp 19   Wt 94 kg   SpO2 96%   BMI 24.57 kg/m  Exam: General: Alert and oriented, no apparent distress  ENTM: No pharyengeal erythema, dry mucous membranes Neck: nontender Cardiovascular: RRR with no murmurs noted Respiratory: CTA bilaterally  Gastrointestinal: Bowel sounds present. No abdominal pain Lower extremities: Left leg status post amputation with prosthetic in place, right leg with necrotic appearing soft tissue wound with some purulence and drainage, tender to palpation on the superior aspect of the midfoot.  Unable to palpate pedal pulses Neuro: No obvious focal deficits Psych:  Behavior and speech appropriate to situation Image per ED provider     Labs and Imaging: CBC BMET  Recent Labs  Lab 11/11/20 0902  WBC 12.2*  HGB 8.9*  HCT 30.3*  PLT 510*   Recent Labs  Lab 11/11/20 0902  NA 135  K 5.3*  CL 104  CO2 23  BUN 39*  CREATININE 2.16*  GLUCOSE 231*  CALCIUM 8.4Jackelyn Poling, DO 11/11/2020, 11:30 AM PGY-3, [facility] FPTS Intern pager: [phone], text pages welcome

## 2020-11-11 NOTE — ED Notes (Signed)
MD at bedside. Applied betadine to the right foot. Noted some maggots coming out from the wound. MD applied abd pad and wrapped foot with gauze.

## 2020-11-11 NOTE — ED Notes (Signed)
1st Lactic normal, 2nd discontinued.

## 2020-11-11 NOTE — Progress Notes (Signed)
Pharmacy Antibiotic Note  Chad Hall is a 70 y.o. male admitted on 11/11/2020 with  osteomyelitis on R 5th toe base .  Pharmacy has been consulted for vancomycin and cefepime dosing. Also on metronidazole per team.  Variable Scr 2.16 (baseline 1.1-1.3), CrCl 39 mL/min Afebrile, WBC 12.2  Plan: Vancomycin 1750mg  IV x1 followed by 1000mg  IV Q24H given variable renal function. Calculated AUC 402. Cefepime 2g IV Q12H - Monitor renal function, clinical status - F/u cultures - Vanc levels as necessary - Narrow abx when possible  Weight: 94 kg (207 lb 3.7 oz)  Temp (24hrs), Avg:99.2 F (37.3 C), Min:99.2 F (37.3 C), Max:99.2 F (37.3 C)  Recent Labs  Lab 11/11/20 0853 11/11/20 0902  WBC  --  12.2*  CREATININE  --  2.16*  LATICACIDVEN 1.3  --     Estimated Creatinine Clearance: 39 mL/min (A) (by C-G formula based on SCr of 2.16 mg/dL (H)).    No Known Allergies  Antimicrobials this admission: Zosyn 9/26 >> 9/26  Vanc 9/26 >> Cefepime 9/26 >> Metronidazole 9/26 >>   Dose adjustments this admission: N/A  Microbiology results: 9/26 BCx: ngtd  Thank you for allowing pharmacy to be a part of this patient's care.  10/26 Chad Hall 11/11/2020 1:23 PM

## 2020-11-11 NOTE — ED Notes (Signed)
Pt repositioned and sat up in the bed. Dinner tray set up. Pt eating at this time.

## 2020-11-11 NOTE — ED Notes (Signed)
Patient transported to MRI 

## 2020-11-11 NOTE — Consult Note (Signed)
Reason for Consult:Right foot wound Referring Physician: Marianna Fuss Time called: [id number] Time at bedside: 0954  Chad Hall is an 70 y.o. male.  HPI: Bralon comes in with a 3 month hx/o right foot wound. He has not been to see anyone about it. It only recently began to hurt when he moved his foot or bore weight. He denies fevers, chills, sweats, N/V.  Past Medical History:  Diagnosis Date   Allergy    Blindness - both eyes 1967   Basketball injury   Diabetes mellitus    Hernia    Hyperlipidemia    Hypertension    Left hip pain 04/10/2013   Marfan syndrome    Osteomyelitis of foot, left, acute (HCC) 12/26/2018   Right knee pain 10/04/2017    Past Surgical History:  Procedure Laterality Date   AMPUTATION Left 12/28/2018   Procedure: LEFT FOOT 3RD RAY AMPUTATION;  Surgeon: Nadara Mustard, MD;  Location: [facility] OR;  Service: Orthopedics;  Laterality: Left;   AMPUTATION Left 01/03/2020   Procedure: LEFT BELOW KNEE AMPUTATION;  Surgeon: Nadara Mustard, MD;  Location: [facility] OR;  Service: Orthopedics;  Laterality: Left;   APPENDECTOMY     INGUINAL HERNIA REPAIR     Right 1970s,    Left 2011 by Dr Job Founds HERNIA REPAIR  08/25/10   recurrent left   PENILE PROSTHESIS IMPLANT  2000s   [facility], Dr Brunilda Payor   RETINAL DETACHMENT SURGERY  1970   Rotator Cuff Repair  2009   Left side, Dr Elita Quick    ROTATOR CUFF REPAIR  2010-2011?   right side    Family History  Problem Relation Age of Onset   Heart disease Mother    Breast cancer Sister     Social History:  reports that he quit smoking about 36 years ago. His smoking use included cigarettes. He has never used smokeless tobacco. He reports that he does not drink alcohol and does not use drugs.  Allergies: No Known Allergies  Medications: I have reviewed the patient's current medications.  Results for orders placed or performed during the hospital encounter of 11/11/20 (from the past 48 hour(s))  CBC with Differential      Status: Abnormal   Collection Time: 11/11/20  9:02 AM  Result Value Ref Range   WBC 12.2 (H) 4.0 - 10.5 K/uL   RBC 3.15 (L) 4.22 - 5.81 MIL/uL   Hemoglobin 8.9 (L) 13.0 - 17.0 g/dL   HCT 00.9 (L) 23.3 - 00.7 %   MCV 96.2 80.0 - 100.0 fL   MCH 28.3 26.0 - 34.0 pg   MCHC 29.4 (L) 30.0 - 36.0 g/dL   RDW 62.2 63.3 - 35.4 %   Platelets 510 (H) 150 - 400 K/uL   nRBC 0.0 0.0 - 0.2 %   Neutrophils Relative % 76 %   Neutro Abs 9.4 (H) 1.7 - 7.7 K/uL   Lymphocytes Relative 10 %   Lymphs Abs 1.2 0.7 - 4.0 K/uL   Monocytes Relative 12 %   Monocytes Absolute 1.4 (H) 0.1 - 1.0 K/uL   Eosinophils Relative 1 %   Eosinophils Absolute 0.1 0.0 - 0.5 K/uL   Basophils Relative 0 %   Basophils Absolute 0.0 0.0 - 0.1 K/uL   Immature Granulocytes 1 %   Abs Immature Granulocytes 0.11 (H) 0.00 - 0.07 K/uL    Comment: Performed at [facility], [address], [place], [place] [address]    DG Foot  Complete Right  Result Date: 11/11/2020 CLINICAL DATA:  Open wounds to the right foot and ankle.  Pain. EXAM: RIGHT FOOT COMPLETE - 3+ VIEW COMPARISON:  None. FINDINGS: There is heterogeneous lucency and mild sclerosis involving the base of the fifth metatarsal with indistinct cortex. There is overlying soft tissue ulceration involving the lateral and dorsal aspects of the foot and anterior ankle. No displaced fracture or dislocation is identified. IMPRESSION: Heterogeneous lucency and cortical disruption in the base of the fifth metatarsal concerning for osteomyelitis. Electronically Signed   By: Sebastian Ache M.D.   On: 11/11/2020 09:51    Review of Systems  Constitutional:  Negative for chills, diaphoresis and fever.  HENT:  Negative for ear discharge, ear pain, hearing loss and tinnitus.   Eyes:  Negative for photophobia and pain.  Respiratory:  Negative for cough and shortness of breath.   Cardiovascular:  Negative for chest pain.  Gastrointestinal:  Negative for abdominal pain, nausea and vomiting.   Genitourinary:  Negative for dysuria, flank pain, frequency and urgency.  Musculoskeletal:  Positive for arthralgias (Right foot). Negative for back pain, myalgias and neck pain.  Neurological:  Negative for dizziness and headaches.  Hematological:  Does not bruise/bleed easily.  Psychiatric/Behavioral:  The patient is not nervous/anxious.   Blood pressure (!) 142/57, pulse 76, temperature 99.2 F (37.3 C), temperature source Oral, resp. rate 20, weight 94 kg, SpO2 97 %. Physical Exam Constitutional:      General: He is not in acute distress.    Appearance: He is well-developed. He is not diaphoretic.  HENT:     Head: Normocephalic and atraumatic.  Eyes:     General: No scleral icterus.       Right eye: No discharge.        Left eye: No discharge.     Conjunctiva/sclera: Conjunctivae normal.  Cardiovascular:     Rate and Rhythm: Normal rate and regular rhythm.  Pulmonary:     Effort: Pulmonary effort is normal. No respiratory distress.  Musculoskeletal:     Cervical back: Normal range of motion.  Feet:     Comments: Right foot: Large ulceration dorsum midfoot, foul odor with purulent discharge, pain with movement of toes but essentially insensate, no palpable pulses Skin:    General: Skin is warm and dry.  Neurological:     Mental Status: He is alert.  Psychiatric:        Mood and Affect: Mood normal.        Behavior: Behavior normal.    Assessment/Plan: Right foot wound -- Will check ABI's and MRI and have vascular surgery consulted. Dr. Lajoyce Corners to evaluate later today or in AM.    Freeman Caldron, PA-C Orthopedic Surgery [phone] 11/11/2020, 9:59 AM

## 2020-11-11 NOTE — ED Notes (Signed)
Patient transported to X-ray 

## 2020-11-11 NOTE — Progress Notes (Signed)
Pharmacy Antibiotic Note  Chad Hall is a 71 y.o. male admitted on 11/11/2020 with  osteomyelitis on R 5th toe base .  Pharmacy has been consulted for vancomycin and zosyn dosing.  Plan: Vancomycin 1750mg  x1 then vanc variable dosing given Cr > 2mg /dL and unsure which way trending, bsl closer to 1.1-1.3mg d/L  Zosyn 3.375g IV q8h over 4 hours -Monitor renal function, clinical status, and antibiotic plan -Order vanc levels as necessary to re-dose -F/u vascular surgery plan for osteomyelitis  Weight: 94 kg (207 lb 3.7 oz)  Temp (24hrs), Avg:99.2 F (37.3 C), Min:99.2 F (37.3 C), Max:99.2 F (37.3 C)  No results for input(s): WBC, CREATININE, LATICACIDVEN, VANCOTROUGH, VANCOPEAK, VANCORANDOM, GENTTROUGH, GENTPEAK, GENTRANDOM, TOBRATROUGH, TOBRAPEAK, TOBRARND, AMIKACINPEAK, AMIKACINTROU, AMIKACIN in the last 168 hours.  CrCl cannot be calculated (Patient's most recent lab result is older than the maximum 21 days allowed.).    No Known Allergies  Antimicrobials this admission: Vanc 9/26 >>  Zosyn 9/26 >>   Dose adjustments this admission: N/A  Microbiology results: 9/26 BCx:   Thank you for allowing pharmacy to be a part of this patient's care.  10/26, PharmD, [facility] Emergency Medicine Clinical Pharmacist ED RPh Phone: [phone] Main RX: [phone]

## 2020-11-12 ENCOUNTER — Observation Stay (HOSPITAL_COMMUNITY): Payer: Medicare Other

## 2020-11-12 ENCOUNTER — Other Ambulatory Visit: Payer: Self-pay | Admitting: Physician Assistant

## 2020-11-12 ENCOUNTER — Encounter (HOSPITAL_COMMUNITY): Payer: Self-pay | Admitting: Family Medicine

## 2020-11-12 ENCOUNTER — Inpatient Hospital Stay (HOSPITAL_COMMUNITY): Payer: Medicare Other

## 2020-11-12 ENCOUNTER — Telehealth (HOSPITAL_COMMUNITY): Payer: Self-pay | Admitting: *Deleted

## 2020-11-12 DIAGNOSIS — E1149 Type 2 diabetes mellitus with other diabetic neurological complication: Secondary | ICD-10-CM | POA: Diagnosis present

## 2020-11-12 DIAGNOSIS — H548 Legal blindness, as defined in USA: Secondary | ICD-10-CM | POA: Diagnosis present

## 2020-11-12 DIAGNOSIS — E860 Dehydration: Secondary | ICD-10-CM | POA: Diagnosis present

## 2020-11-12 DIAGNOSIS — E1142 Type 2 diabetes mellitus with diabetic polyneuropathy: Secondary | ICD-10-CM | POA: Diagnosis present

## 2020-11-12 DIAGNOSIS — R531 Weakness: Secondary | ICD-10-CM | POA: Diagnosis present

## 2020-11-12 DIAGNOSIS — E11621 Type 2 diabetes mellitus with foot ulcer: Secondary | ICD-10-CM | POA: Diagnosis present

## 2020-11-12 DIAGNOSIS — B951 Streptococcus, group B, as the cause of diseases classified elsewhere: Secondary | ICD-10-CM | POA: Diagnosis present

## 2020-11-12 DIAGNOSIS — I110 Hypertensive heart disease with heart failure: Secondary | ICD-10-CM | POA: Diagnosis present

## 2020-11-12 DIAGNOSIS — I96 Gangrene, not elsewhere classified: Secondary | ICD-10-CM | POA: Diagnosis not present

## 2020-11-12 DIAGNOSIS — M8600 Acute hematogenous osteomyelitis, unspecified site: Secondary | ICD-10-CM | POA: Diagnosis not present

## 2020-11-12 DIAGNOSIS — B879 Myiasis, unspecified: Secondary | ICD-10-CM | POA: Diagnosis not present

## 2020-11-12 DIAGNOSIS — M869 Osteomyelitis, unspecified: Secondary | ICD-10-CM | POA: Diagnosis present

## 2020-11-12 DIAGNOSIS — Z20822 Contact with and (suspected) exposure to covid-19: Secondary | ICD-10-CM | POA: Diagnosis present

## 2020-11-12 DIAGNOSIS — Q2549 Other congenital malformations of aorta: Secondary | ICD-10-CM | POA: Diagnosis not present

## 2020-11-12 DIAGNOSIS — A499 Bacterial infection, unspecified: Secondary | ICD-10-CM

## 2020-11-12 DIAGNOSIS — B9562 Methicillin resistant Staphylococcus aureus infection as the cause of diseases classified elsewhere: Secondary | ICD-10-CM | POA: Diagnosis present

## 2020-11-12 DIAGNOSIS — E785 Hyperlipidemia, unspecified: Secondary | ICD-10-CM | POA: Diagnosis present

## 2020-11-12 DIAGNOSIS — E875 Hyperkalemia: Secondary | ICD-10-CM | POA: Diagnosis present

## 2020-11-12 DIAGNOSIS — N179 Acute kidney failure, unspecified: Secondary | ICD-10-CM | POA: Diagnosis present

## 2020-11-12 DIAGNOSIS — E43 Unspecified severe protein-calorie malnutrition: Secondary | ICD-10-CM | POA: Diagnosis present

## 2020-11-12 DIAGNOSIS — B871 Wound myiasis: Secondary | ICD-10-CM | POA: Diagnosis present

## 2020-11-12 DIAGNOSIS — R7881 Bacteremia: Secondary | ICD-10-CM | POA: Diagnosis present

## 2020-11-12 DIAGNOSIS — L97418 Non-pressure chronic ulcer of right heel and midfoot with other specified severity: Secondary | ICD-10-CM | POA: Diagnosis present

## 2020-11-12 DIAGNOSIS — I081 Rheumatic disorders of both mitral and tricuspid valves: Secondary | ICD-10-CM | POA: Diagnosis not present

## 2020-11-12 DIAGNOSIS — E1152 Type 2 diabetes mellitus with diabetic peripheral angiopathy with gangrene: Secondary | ICD-10-CM | POA: Diagnosis present

## 2020-11-12 DIAGNOSIS — I739 Peripheral vascular disease, unspecified: Secondary | ICD-10-CM | POA: Diagnosis present

## 2020-11-12 DIAGNOSIS — I5032 Chronic diastolic (congestive) heart failure: Secondary | ICD-10-CM | POA: Diagnosis present

## 2020-11-12 DIAGNOSIS — D72829 Elevated white blood cell count, unspecified: Secondary | ICD-10-CM | POA: Diagnosis not present

## 2020-11-12 DIAGNOSIS — E1169 Type 2 diabetes mellitus with other specified complication: Secondary | ICD-10-CM | POA: Diagnosis present

## 2020-11-12 DIAGNOSIS — Z89512 Acquired absence of left leg below knee: Secondary | ICD-10-CM | POA: Diagnosis not present

## 2020-11-12 DIAGNOSIS — T148XXA Other injury of unspecified body region, initial encounter: Secondary | ICD-10-CM | POA: Diagnosis not present

## 2020-11-12 DIAGNOSIS — B9561 Methicillin susceptible Staphylococcus aureus infection as the cause of diseases classified elsewhere: Secondary | ICD-10-CM | POA: Diagnosis not present

## 2020-11-12 DIAGNOSIS — Q8740 Marfan's syndrome, unspecified: Secondary | ICD-10-CM | POA: Diagnosis not present

## 2020-11-12 DIAGNOSIS — L02611 Cutaneous abscess of right foot: Secondary | ICD-10-CM | POA: Diagnosis present

## 2020-11-12 DIAGNOSIS — I712 Thoracic aortic aneurysm, without rupture, unspecified: Secondary | ICD-10-CM | POA: Diagnosis present

## 2020-11-12 DIAGNOSIS — H543 Unqualified visual loss, both eyes: Secondary | ICD-10-CM | POA: Diagnosis not present

## 2020-11-12 DIAGNOSIS — I77819 Aortic ectasia, unspecified site: Secondary | ICD-10-CM | POA: Diagnosis not present

## 2020-11-12 HISTORY — DX: Bacterial infection, unspecified: A49.9

## 2020-11-12 LAB — COMPREHENSIVE METABOLIC PANEL
ALT: 30 U/L (ref 0–44)
AST: 33 U/L (ref 15–41)
Albumin: 2 g/dL — ABNORMAL LOW (ref 3.5–5.0)
Alkaline Phosphatase: 52 U/L (ref 38–126)
Anion gap: 8 (ref 5–15)
BUN: 34 mg/dL — ABNORMAL HIGH (ref 8–23)
CO2: 22 mmol/L (ref 22–32)
Calcium: 8.6 mg/dL — ABNORMAL LOW (ref 8.9–10.3)
Chloride: 106 mmol/L (ref 98–111)
Creatinine, Ser: 1.66 mg/dL — ABNORMAL HIGH (ref 0.61–1.24)
GFR, Estimated: 44 mL/min — ABNORMAL LOW (ref >60)
Glucose, Bld: 136 mg/dL — ABNORMAL HIGH (ref 70–99)
Potassium: 4.7 mmol/L (ref 3.5–5.1)
Sodium: 136 mmol/L (ref 135–145)
Total Bilirubin: 0.7 mg/dL (ref 0.3–1.2)
Total Protein: 7.4 g/dL (ref 6.5–8.1)

## 2020-11-12 LAB — BLOOD CULTURE ID PANEL (REFLEXED) - BCID2
A.calcoaceticus-baumannii: NOT DETECTED
Bacteroides fragilis: NOT DETECTED
Candida albicans: NOT DETECTED
Candida auris: NOT DETECTED
Candida glabrata: NOT DETECTED
Candida krusei: NOT DETECTED
Candida parapsilosis: NOT DETECTED
Candida tropicalis: NOT DETECTED
Cryptococcus neoformans/gattii: NOT DETECTED
Enterobacter cloacae complex: NOT DETECTED
Enterobacterales: NOT DETECTED
Enterococcus Faecium: NOT DETECTED
Enterococcus faecalis: NOT DETECTED
Escherichia coli: NOT DETECTED
Haemophilus influenzae: NOT DETECTED
Klebsiella aerogenes: NOT DETECTED
Klebsiella oxytoca: NOT DETECTED
Klebsiella pneumoniae: NOT DETECTED
Listeria monocytogenes: NOT DETECTED
Meth resistant mecA/C and MREJ: DETECTED — AB
Methicillin resistance mecA/C: DETECTED — AB
Neisseria meningitidis: NOT DETECTED
Proteus species: NOT DETECTED
Pseudomonas aeruginosa: NOT DETECTED
Salmonella species: NOT DETECTED
Serratia marcescens: NOT DETECTED
Staphylococcus aureus (BCID): DETECTED — AB
Staphylococcus epidermidis: DETECTED — AB
Staphylococcus lugdunensis: NOT DETECTED
Staphylococcus species: DETECTED — AB
Stenotrophomonas maltophilia: NOT DETECTED
Streptococcus agalactiae: DETECTED — AB
Streptococcus pneumoniae: NOT DETECTED
Streptococcus pyogenes: NOT DETECTED
Streptococcus species: DETECTED — AB

## 2020-11-12 LAB — GLUCOSE, CAPILLARY
Glucose-Capillary: 131 mg/dL — ABNORMAL HIGH (ref 70–99)
Glucose-Capillary: 129 mg/dL — ABNORMAL HIGH (ref 70–99)

## 2020-11-12 LAB — ECHOCARDIOGRAM COMPLETE
Area-P 1/2: 2.45 cm2
S' Lateral: 3.2 cm
Weight: 3315.72 oz

## 2020-11-12 LAB — CBC
HCT: 30 % — ABNORMAL LOW (ref 39.0–52.0)
Hemoglobin: 8.9 g/dL — ABNORMAL LOW (ref 13.0–17.0)
MCH: 28.2 pg (ref 26.0–34.0)
MCHC: 29.7 g/dL — ABNORMAL LOW (ref 30.0–36.0)
MCV: 94.9 fL (ref 80.0–100.0)
Platelets: 520 10*3/uL — ABNORMAL HIGH (ref 150–400)
RBC: 3.16 MIL/uL — ABNORMAL LOW (ref 4.22–5.81)
RDW: 12 % (ref 11.5–15.5)
WBC: 12.2 10*3/uL — ABNORMAL HIGH (ref 4.0–10.5)
nRBC: 0 % (ref 0.0–0.2)

## 2020-11-12 LAB — CK: Total CK: 107 U/L (ref 49–397)

## 2020-11-12 LAB — CBG MONITORING, ED
Glucose-Capillary: 144 mg/dL — ABNORMAL HIGH (ref 70–99)
Glucose-Capillary: 153 mg/dL — ABNORMAL HIGH (ref 70–99)
Glucose-Capillary: 102 mg/dL — ABNORMAL HIGH (ref 70–99)

## 2020-11-12 LAB — HEMOGLOBIN A1C
Hgb A1c MFr Bld: 6.9 % — ABNORMAL HIGH (ref 4.8–5.6)
Mean Plasma Glucose: 151 mg/dL

## 2020-11-12 MED ORDER — SODIUM CHLORIDE 0.9 % IV SOLN
8.0000 mg/kg | Freq: Every day | INTRAVENOUS | Status: DC
  Administered 2020-11-12 – 2020-11-18 (×7): 750 mg via INTRAVENOUS
  Filled 2020-11-12 (×7): qty 15

## 2020-11-12 NOTE — ED Notes (Signed)
Infectious disease MD at bedside, Echo tech at bedside.

## 2020-11-12 NOTE — Consult Note (Signed)
ORTHOPAEDIC CONSULTATION  REQUESTING PHYSICIAN: Nestor Ramp, MD  Chief Complaint: Gangrene right foot with ulceration and maggots.  HPI: Chad Hall is a 70 y.o. male who presents with gangrene dorsum of the right foot with ulceration anteriorly over the ankle with maggots over the lateral ankle wound.  Patient is blind with history of diabetes and hypertension.  Patient is 1 year status post left below the knee amputation and patient has been ambulating well with a prosthesis on the left.  Past Medical History:  Diagnosis Date   Allergy    Blindness - both eyes 1967   Basketball injury   Diabetes mellitus    Hernia    Hyperlipidemia    Hypertension    Left hip pain 04/10/2013   Marfan syndrome    Osteomyelitis of foot, left, acute (HCC) 12/26/2018   Right knee pain 10/04/2017   Past Surgical History:  Procedure Laterality Date   AMPUTATION Left 12/28/2018   Procedure: LEFT FOOT 3RD RAY AMPUTATION;  Surgeon: Nadara Mustard, MD;  Location: [facility] OR;  Service: Orthopedics;  Laterality: Left;   AMPUTATION Left 01/03/2020   Procedure: LEFT BELOW KNEE AMPUTATION;  Surgeon: Nadara Mustard, MD;  Location: [facility] OR;  Service: Orthopedics;  Laterality: Left;   APPENDECTOMY     INGUINAL HERNIA REPAIR     Right 1970s,    Left 2011 by Dr Job Founds HERNIA REPAIR  08/25/10   recurrent left   PENILE PROSTHESIS IMPLANT  2000s   [facility], Dr Brunilda Payor   RETINAL DETACHMENT SURGERY  1970   Rotator Cuff Repair  2009   Left side, Dr Elita Quick    ROTATOR CUFF REPAIR  2010-2011?   right side   Social History   Socioeconomic History   Marital status: Married    Spouse name: ANGUEL DELAPENA   Number of children: 0   Years of education: 12th    Highest education level: Not on file  Occupational History   Occupation: industrial sewing    Employer: [employer]  Tobacco Use   Smoking status: Former    Types: Cigarettes    Quit date: 02/14/1984    Years since quitting:  36.7   Smokeless tobacco: Never  Substance and Sexual Activity   Alcohol use: No    Comment: Quit in 10/1983   Drug use: No   Sexual activity: Not on file  Other Topics Concern   Not on file  Social History Narrative   Lives in [place] with wife, ROMELLE REILEY   Blind.  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   Exercise: Pt has no regular exercise routine.   Seatbelts: Pt reports wearing seatbelt when in vehicles.    Wynelle Link Exposure/Protection:    Hobbies: Proofreader, walking, sports         Social Determinants of Corporate investment banker Strain: Not on [employer] Insecurity: Not on file  Transportation Needs: Not on file  Physical Activity: Not on file  Stress: Not on file  Social Connections: Not on file   Family History  Problem Relation Age of Onset   Heart disease Mother    Breast cancer Sister    - negative except otherwise stated in the family history section No Known Allergies Prior to Admission medications   Medication Sig Start Date End Date Taking? Authorizing Provider  atorvastatin (LIPITOR) 80 MG tablet Take 1 tablet (80 mg total) by mouth daily. 09/19/20  Yes Lilland, Alana, DO  cholecalciferol (VITAMIN D3) 25 MCG (1000 UT) tablet Take 1,000 Units by mouth at bedtime.   Yes [provider]  empagliflozin (JARDIANCE) 10 MG TABS tablet Take 1 tablet (10 mg total) by mouth daily. 09/19/20  Yes Lilland, Alana, DO  enalapril (VASOTEC) 20 MG tablet Take 1 tablet (20 mg total) by mouth daily. 09/19/20  Yes Lilland, Alana, DO  gabapentin (NEURONTIN) 100 MG capsule Take 3 capsules (300 mg total) by mouth daily. Patient taking differently: Take 100-200 mg by mouth See  admin instructions. 200mg  in the morning 100mg  at night 09/19/20 03/18/21 Yes Lilland, Alana, DO  linagliptin (TRADJENTA) 5 MG TABS tablet Take 1 tablet (5 mg total) by mouth at bedtime. 09/19/20  Yes Lilland, Alana, DO  metoprolol succinate (TOPROL-XL) 25 MG 24 hr tablet Take 0.5 tablets (12.5 mg total) by mouth every evening. 09/19/20  Yes Lilland, Alana, DO  triamcinolone cream (KENALOG) 0.1 % APPLY TOPICALLY TWICE DAILY AS NEEDED FOR DRY SKIN Patient taking differently: Apply 1 application topically 2 (two) times daily as needed (dry skin). 10/14/20  Yes Lilland, Alana, DO  augmented betamethasone dipropionate (DIPROLENE-AF) 0.05 % cream APPLY TOPICALLY TWICE DAILY Patient not taking: Reported on 11/11/2020 10/14/20   Lilland, Alana, DO  PRODIGY NO CODING BLOOD GLUC test strip USE TO CHECK BLOOD SUGAR  THREE TIMES DAILY OR AS  DIRECTED 07/08/20   10/16/20, MD  Prodigy Twist Top Lancets 28G MISC CHECK BLOOD SUGAR 3 TIMES  DAILY 03/26/20   [name], MD   MR FOOT RIGHT W WO CONTRAST  Result Date: 11/11/2020 CLINICAL DATA:  Right foot wound. EXAM: MRI OF THE RIGHT FOREFOOT WITHOUT AND WITH CONTRAST TECHNIQUE: Multiplanar, multisequence MR imaging of the right foot was performed before and after the administration of intravenous contrast. CONTRAST:  59mL GADAVIST GADOBUTROL 1 MMOL/ML IV SOLN COMPARISON:  Right foot x-rays from same day. FINDINGS: Bones/Joint/Cartilage No marrow signal abnormality. No fracture or dislocation. Joint spaces are preserved. Small tibiotalar joint effusion. Ligaments Intact. Muscles and Tendons Flexor, extensor, peroneal, and Achilles tendons are intact. Moderate amount of fluid in the flexor hallucis longus tendon sheath. Diffuse atrophy of the intrinsic foot muscles. Soft tissue Dorsal midfoot ulceration. Diffuse soft tissue swelling. No fluid collection or hematoma. No soft tissue mass. IMPRESSION: 1. Dorsal midfoot ulceration. No evidence of osteomyelitis or abscess. 2. Moderate  flexor hallucis longus tenosynovitis. Electronically Signed   By: 11/13/2020 M.D.   On: 11/11/2020 14:33   DG Foot Complete Right  Result Date: 11/11/2020 CLINICAL DATA:  Open wounds to the right foot and ankle.  Pain. EXAM: RIGHT FOOT COMPLETE - 3+ VIEW COMPARISON:  None. FINDINGS: There is heterogeneous lucency and mild sclerosis involving the base of the fifth metatarsal with indistinct cortex. There is overlying soft tissue ulceration involving the lateral and dorsal aspects of the foot and anterior ankle. No displaced fracture or dislocation is identified. IMPRESSION: Heterogeneous lucency and cortical disruption in the base of the fifth metatarsal concerning for osteomyelitis. Electronically Signed   By: 11/13/2020 M.D.   On: 11/11/2020 09:51   -  pertinent xrays, CT, MRI studies were reviewed and independently interpreted  Positive ROS: All other systems have been reviewed and were otherwise negative with the exception of those mentioned in the HPI and as above.  Physical Exam: General: Alert, no acute distress Psychiatric: Patient is competent for consent with normal mood and affect Lymphatic: No axillary or cervical lymphadenopathy Cardiovascular: No pedal edema Respiratory: No cyanosis, no use of accessory musculature GI: No organomegaly, abdomen is soft and non-tender    Images:  @ENCIMAGES @  Labs:  Lab Results  Component Value Date   HGBA1C 5.3 01/01/2020   HGBA1C 5.4 09/26/2019   HGBA1C 7.0 05/04/2019   ESRSEDRATE 108 (H) 12/21/2019   CRP 21.2 (H) 11/11/2020   CRP 35.5 (H) 12/21/2019   REPTSTATUS PENDING 11/11/2020   GRAMSTAIN NO WBC SEEN RARE GRAM POSITIVE COCCI  12/08/2019   CULT  11/11/2020    GRAM POSITIVE COCCI TOO YOUNG TO READ Performed at [facility], [address] [place], [place]    [address] [facility] [place] 01/01/2020    Lab Results  Component Value Date   ALBUMIN 1.9 (L) 01/03/2020   ALBUMIN 2.0 (L) 01/02/2020    ALBUMIN 2.6 (L) 01/01/2020     CBC EXTENDED Latest Ref Rng & Units 11/11/2020 01/07/2020 01/06/2020  WBC 4.0 - 10.5 K/uL 12.2(H) 7.8 8.0  RBC 4.22 - 5.81 MIL/uL 3.15(L) 3.05(L) 2.94(L)  HGB 13.0 - 17.0 g/dL 01/08/2020) 4.5(O) 8.3(L)  HCT 39.0 - 52.0 % 30.3(L) 29.1(L) 28.2(L)  PLT 150 - 400 K/uL 510(H) 370 330  NEUTROABS 1.7 - 7.7 K/uL 9.4(H) - -  LYMPHSABS 0.7 - 4.0 K/uL 1.2 - -    Neurologic: Patient does not have protective sensation bilateral lower extremities.   MUSCULOSKELETAL:   Skin: Patient has thin atrophic skin on the right gangrenous ulcer.  There is an over the midfoot to ankle with ulceration over the ankle with persistent maggots.  Patient does not have palpable pulses at the ankle.  The calf is soft nontender with good warmth no ascending cellulitis.  Patient has a stable hemoglobin at 8.9 with a albumin of 1.9.  The C-reactive protein is 21.2.  Patient has been evaluated by vascular vein surgery.  I have reviewed patient's case with Dr. 5.9(Y.  I do not feel that even with ideal revascularization patient would have sufficient soft tissue for foot salvage intervention.  Neutrophil to lymphocyte ratio is 7.8  Assessment: Gangrene right foot and ankle with maggots with peripheral vascular disease diabetes hypertension and severe protein caloric malnutrition.  Plan: I have reviewed options with the patient and patient states that he would like to proceed with a transtibial amputation.  We will set this up for Wednesday.  Anticipate patient will need discharge to skilled nursing postoperatively.  Thank you for the consult and the opportunity to see Mr. Asiah Befort, MD [facility] [phone] 9:39 AM

## 2020-11-12 NOTE — ED Notes (Signed)
Pt received breakfast and eating no other needs from pt at this time.

## 2020-11-12 NOTE — Progress Notes (Signed)
FPTS Interim Progress Note  Spoke with patient about plans after surgery. Patients informed me that he would like to go home with home health, after his surgery. He says that after his first amputation, he went to a SNF and spent most of the time laying in bed, except when people came by to do 30-45 minute exercises for his amputation. He notes that after he left the SNF, people came to his home to perform the same exercises that they had been performing in the SNF. He feels that he would benefit more from being home and having people come to him to perform the leg exercises. He also feels like he's been through this with one leg, and has an idea of how things will go/need to go. Patient also notes that his wife is also blind, but a good support for him, and that she was very helpful with his last amputation. He has siblings that are helpful at time, in a limited capacity.   He also reports that he plans on seeing his prosthetic specialist for a follow up on his prothesis adjustments. He feels that he'll be able to get adjustments made for his first prosthesis, while recovering from his BKA.   Bess Kinds, MD 11/12/2020, 3:47 PM PGY-1, [facility] Family Medicine Service pager [phone]

## 2020-11-12 NOTE — H&P (View-Only) (Signed)
  ORTHOPAEDIC CONSULTATION  REQUESTING PHYSICIAN: Neal, Sara L, MD  Chief Complaint: Gangrene right foot with ulceration and maggots.  HPI: Chad Hall is a 70 y.o. male who presents with gangrene dorsum of the right foot with ulceration anteriorly over the ankle with maggots over the lateral ankle wound.  Patient is blind with history of diabetes and hypertension.  Patient is 1 year status post left below the knee amputation and patient has been ambulating well with a prosthesis on the left.  Past Medical History:  Diagnosis Date   Allergy    Blindness - both eyes 1967   Basketball injury   Diabetes mellitus    Hernia    Hyperlipidemia    Hypertension    Left hip pain 04/10/2013   Marfan syndrome    Osteomyelitis of foot, left, acute (HCC) 12/26/2018   Right knee pain 10/04/2017   Past Surgical History:  Procedure Laterality Date   AMPUTATION Left 12/28/2018   Procedure: LEFT FOOT 3RD RAY AMPUTATION;  Surgeon: Merissa Renwick V, MD;  Location: [facility] OR;  Service: Orthopedics;  Laterality: Left;   AMPUTATION Left 01/03/2020   Procedure: LEFT BELOW KNEE AMPUTATION;  Surgeon: Grace Valley V, MD;  Location: [facility] OR;  Service: Orthopedics;  Laterality: Left;   APPENDECTOMY     INGUINAL HERNIA REPAIR     Right 1970s,    Left 2011 by Dr Wilson   INGUINAL HERNIA REPAIR  08/25/10   recurrent left   PENILE PROSTHESIS IMPLANT  2000s   [facility], Dr Nesi   RETINAL DETACHMENT SURGERY  1970   Rotator Cuff Repair  2009   Left side, Dr Rowen    ROTATOR CUFF REPAIR  2010-2011?   right side   Social History   Socioeconomic History   Marital status: Married    Spouse name: Chad Hall   Number of children: 0   Years of education: 12th    Highest education level: Not on file  Occupational History   Occupation: industrial sewing    Employer: [employer]  Tobacco Use   Smoking status: Former    Types: Cigarettes    Quit date: 02/14/1984    Years since quitting:  36.7   Smokeless tobacco: Never  Substance and Sexual Activity   Alcohol use: No    Comment: Quit in 10/1983   Drug use: No   Sexual activity: Not on file  Other Topics Concern   Not on file  Social History Narrative   Lives in [place] with wife, Chad Hall   Blind.  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   Exercise: Pt has no regular exercise routine.   Seatbelts: Pt reports wearing seatbelt when in vehicles.    Sun Exposure/Protection:    Hobbies: Amateur Radio, walking, sports         Social Determinants of Health   Financial Resource Strain: Not on file  Food Insecurity: Not on file  Transportation Needs: Not on file  Physical Activity: Not on file  Stress: Not on file  Social Connections: Not on file   Family History    Problem Relation Age of Onset   Heart disease Mother    Breast cancer Sister    - negative except otherwise stated in the family history section No Known Allergies Prior to Admission medications   Medication Sig Start Date End Date Taking? Authorizing Provider  atorvastatin (LIPITOR) 80 MG tablet Take 1 tablet (80 mg total) by mouth daily. 09/19/20  Yes Lilland, Alana, DO  cholecalciferol (VITAMIN D3) 25 MCG (1000 UT) tablet Take 1,000 Units by mouth at bedtime.   Yes [provider]  empagliflozin (JARDIANCE) 10 MG TABS tablet Take 1 tablet (10 mg total) by mouth daily. 09/19/20  Yes Lilland, Alana, DO  enalapril (VASOTEC) 20 MG tablet Take 1 tablet (20 mg total) by mouth daily. 09/19/20  Yes Lilland, Alana, DO  gabapentin (NEURONTIN) 100 MG capsule Take 3 capsules (300 mg total) by mouth daily. Patient taking differently: Take 100-200 mg by mouth See  admin instructions. 200mg in the morning 100mg at night 09/19/20 03/18/21 Yes Lilland, Alana, DO  linagliptin (TRADJENTA) 5 MG TABS tablet Take 1 tablet (5 mg total) by mouth at bedtime. 09/19/20  Yes Lilland, Alana, DO  metoprolol succinate (TOPROL-XL) 25 MG 24 hr tablet Take 0.5 tablets (12.5 mg total) by mouth every evening. 09/19/20  Yes Lilland, Alana, DO  triamcinolone cream (KENALOG) 0.1 % APPLY TOPICALLY TWICE DAILY AS NEEDED FOR DRY SKIN Patient taking differently: Apply 1 application topically 2 (two) times daily as needed (dry skin). 10/14/20  Yes Lilland, Alana, DO  augmented betamethasone dipropionate (DIPROLENE-AF) 0.05 % cream APPLY TOPICALLY TWICE DAILY Patient not taking: Reported on 11/11/2020 10/14/20   Lilland, Alana, DO  PRODIGY NO CODING BLOOD GLUC test strip USE TO CHECK BLOOD SUGAR  THREE TIMES DAILY OR AS  DIRECTED 07/08/20   Frank, Peter, MD  Prodigy Twist Top Lancets 28G MISC CHECK BLOOD SUGAR 3 TIMES  DAILY 03/26/20   Frank, Peter, MD   MR FOOT RIGHT W WO CONTRAST  Result Date: 11/11/2020 CLINICAL DATA:  Right foot wound. EXAM: MRI OF THE RIGHT FOREFOOT WITHOUT AND WITH CONTRAST TECHNIQUE: Multiplanar, multisequence MR imaging of the right foot was performed before and after the administration of intravenous contrast. CONTRAST:  9mL GADAVIST GADOBUTROL 1 MMOL/ML IV SOLN COMPARISON:  Right foot x-rays from same day. FINDINGS: Bones/Joint/Cartilage No marrow signal abnormality. No fracture or dislocation. Joint spaces are preserved. Small tibiotalar joint effusion. Ligaments Intact. Muscles and Tendons Flexor, extensor, peroneal, and Achilles tendons are intact. Moderate amount of fluid in the flexor hallucis longus tendon sheath. Diffuse atrophy of the intrinsic foot muscles. Soft tissue Dorsal midfoot ulceration. Diffuse soft tissue swelling. No fluid collection or hematoma. No soft tissue mass. IMPRESSION: 1. Dorsal midfoot ulceration. No evidence of osteomyelitis or abscess. 2. Moderate  flexor hallucis longus tenosynovitis. Electronically Signed   By: William T Derry M.D.   On: 11/11/2020 14:33   DG Foot Complete Right  Result Date: 11/11/2020 CLINICAL DATA:  Open wounds to the right foot and ankle.  Pain. EXAM: RIGHT FOOT COMPLETE - 3+ VIEW COMPARISON:  None. FINDINGS: There is heterogeneous lucency and mild sclerosis involving the base of the fifth metatarsal with indistinct cortex. There is overlying soft tissue ulceration involving the lateral and dorsal aspects of the foot and anterior ankle. No displaced fracture or dislocation is identified. IMPRESSION: Heterogeneous lucency and cortical disruption in the base of the fifth metatarsal concerning for osteomyelitis. Electronically Signed   By: Allen  Grady M.D.   On: 11/11/2020 09:51   -   pertinent xrays, CT, MRI studies were reviewed and independently interpreted  Positive ROS: All other systems have been reviewed and were otherwise negative with the exception of those mentioned in the HPI and as above.  Physical Exam: General: Alert, no acute distress Psychiatric: Patient is competent for consent with normal mood and affect Lymphatic: No axillary or cervical lymphadenopathy Cardiovascular: No pedal edema Respiratory: No cyanosis, no use of accessory musculature GI: No organomegaly, abdomen is soft and non-tender    Images:  @ENCIMAGES @  Labs:  Lab Results  Component Value Date   HGBA1C 5.3 01/01/2020   HGBA1C 5.4 09/26/2019   HGBA1C 7.0 05/04/2019   ESRSEDRATE 108 (H) 12/21/2019   CRP 21.2 (H) 11/11/2020   CRP 35.5 (H) 12/21/2019   REPTSTATUS PENDING 11/11/2020   GRAMSTAIN NO WBC SEEN RARE GRAM POSITIVE COCCI  12/08/2019   CULT  11/11/2020    GRAM POSITIVE COCCI TOO YOUNG TO READ Performed at [facility], [address] [place], [place]    [address] [facility] [place] 01/01/2020    Lab Results  Component Value Date   ALBUMIN 1.9 (L) 01/03/2020   ALBUMIN 2.0 (L) 01/02/2020    ALBUMIN 2.6 (L) 01/01/2020     CBC EXTENDED Latest Ref Rng & Units 11/11/2020 01/07/2020 01/06/2020  WBC 4.0 - 10.5 K/uL 12.2(H) 7.8 8.0  RBC 4.22 - 5.81 MIL/uL 3.15(L) 3.05(L) 2.94(L)  HGB 13.0 - 17.0 g/dL 01/08/2020) 4.5(O) 8.3(L)  HCT 39.0 - 52.0 % 30.3(L) 29.1(L) 28.2(L)  PLT 150 - 400 K/uL 510(H) 370 330  NEUTROABS 1.7 - 7.7 K/uL 9.4(H) - -  LYMPHSABS 0.7 - 4.0 K/uL 1.2 - -    Neurologic: Patient does not have protective sensation bilateral lower extremities.   MUSCULOSKELETAL:   Skin: Patient has thin atrophic skin on the right gangrenous ulcer.  There is an over the midfoot to ankle with ulceration over the ankle with persistent maggots.  Patient does not have palpable pulses at the ankle.  The calf is soft nontender with good warmth no ascending cellulitis.  Patient has a stable hemoglobin at 8.9 with a albumin of 1.9.  The C-reactive protein is 21.2.  Patient has been evaluated by vascular vein surgery.  I have reviewed patient's case with Dr. 5.9(Y.  I do not feel that even with ideal revascularization patient would have sufficient soft tissue for foot salvage intervention.  Neutrophil to lymphocyte ratio is 7.8  Assessment: Gangrene right foot and ankle with maggots with peripheral vascular disease diabetes hypertension and severe protein caloric malnutrition.  Plan: I have reviewed options with the patient and patient states that he would like to proceed with a transtibial amputation.  We will set this up for Wednesday.  Anticipate patient will need discharge to skilled nursing postoperatively.  Thank you for the consult and the opportunity to see Mr. Asiah Befort, MD [facility] [phone] 9:39 AM

## 2020-11-12 NOTE — Progress Notes (Signed)
FPTS Interim Progress Note  S:Patient asleep in bed  O: BP 132/64 (BP Location: Left Arm)   Pulse 88   Temp 98.2 F (36.8 C) (Oral)   Resp 17   Ht 6\' 5"  (1.956 m)   Wt 95.3 kg   SpO2 100%   BMI 24.90 kg/m    Gen: NAD, laying in bed asleep Resp: Chest rises symmetrically, no iWOB  A/P: Dorsal right foot infection -plan for transtibial amputation tomorrow -NPO currently -Echo with no vegetations, plan for TEE Monday -Labs and orders reviewed  Friday, MD 11/12/2020, 11:36 PM PGY-1, [facility] Family Medicine Service pager [phone]

## 2020-11-12 NOTE — Progress Notes (Signed)
PHARMACY - PHYSICIAN COMMUNICATION CRITICAL VALUE ALERT - BLOOD CULTURE IDENTIFICATION (BCID)  Chad Hall is an 70 y.o. male who presented to [facility] on 11/11/2020 with a chief complaint of foot wound.   Name of physician (or Provider) Contacted: Dr. Salvadore Dom  Current antibiotics: Vancomycin, Cefepime, Flagyl  Changes to prescribed antibiotics recommended:  No changes for now Will get auto ID consult for MRSA Dr Lajoyce Corners planning to see today  Results for orders placed or performed during the hospital encounter of 11/11/20  Blood Culture ID Panel (Reflexed) (Collected: 11/11/2020  8:53 AM)  Result Value Ref Range   Enterococcus faecalis NOT DETECTED NOT DETECTED   Enterococcus Faecium NOT DETECTED NOT DETECTED   Listeria monocytogenes NOT DETECTED NOT DETECTED   Staphylococcus species DETECTED (A) NOT DETECTED   Staphylococcus aureus (BCID) DETECTED (A) NOT DETECTED   Staphylococcus epidermidis DETECTED (A) NOT DETECTED   Staphylococcus lugdunensis NOT DETECTED NOT DETECTED   Streptococcus species DETECTED (A) NOT DETECTED   Streptococcus agalactiae DETECTED (A) NOT DETECTED   Streptococcus pneumoniae NOT DETECTED NOT DETECTED   Streptococcus pyogenes NOT DETECTED NOT DETECTED   A.calcoaceticus-baumannii NOT DETECTED NOT DETECTED   Bacteroides fragilis NOT DETECTED NOT DETECTED   Enterobacterales NOT DETECTED NOT DETECTED   Enterobacter cloacae complex NOT DETECTED NOT DETECTED   Escherichia coli NOT DETECTED NOT DETECTED   Klebsiella aerogenes NOT DETECTED NOT DETECTED   Klebsiella oxytoca NOT DETECTED NOT DETECTED   Klebsiella pneumoniae NOT DETECTED NOT DETECTED   Proteus species NOT DETECTED NOT DETECTED   Salmonella species NOT DETECTED NOT DETECTED   Serratia marcescens NOT DETECTED NOT DETECTED   Haemophilus influenzae NOT DETECTED NOT DETECTED   Neisseria meningitidis NOT DETECTED NOT DETECTED   Pseudomonas aeruginosa NOT DETECTED NOT DETECTED    Stenotrophomonas maltophilia NOT DETECTED NOT DETECTED   Candida albicans NOT DETECTED NOT DETECTED   Candida auris NOT DETECTED NOT DETECTED   Candida glabrata NOT DETECTED NOT DETECTED   Candida krusei NOT DETECTED NOT DETECTED   Candida parapsilosis NOT DETECTED NOT DETECTED   Candida tropicalis NOT DETECTED NOT DETECTED   Cryptococcus neoformans/gattii NOT DETECTED NOT DETECTED   Methicillin resistance mecA/C DETECTED (A) NOT DETECTED   Meth resistant mecA/C and MREJ DETECTED (A) NOT DETECTED    Chad Hall 11/12/2020  2:02 AM

## 2020-11-12 NOTE — Progress Notes (Signed)
PT Cancellation Note  Patient Details Name: BRUIN BOLGER MRN: [id number] DOB: 1950/08/02   Cancelled Treatment:    Reason Eval/Treat Not Completed: Patient at procedure or test/unavailable Pt currently at testing. Also per notes, likely to OR for R BKA on Wednesday. Will hold and follow up as pt appropriate and as schedule allows.   Farley Ly, PT, DPT  Acute Rehabilitation Services  Pager: [phone] Office: [phone]    Lehman Prom 11/12/2020, 10:45 AM

## 2020-11-12 NOTE — Progress Notes (Signed)
Covering cardmaster today. Received request for TEE. Scheduled for next available date which is Monday @ 9:30am. [facility] will follow later this week to do consent/orders.

## 2020-11-12 NOTE — Plan of Care (Signed)

## 2020-11-12 NOTE — Plan of Care (Signed)

## 2020-11-12 NOTE — Progress Notes (Signed)
Spoke with Dr. Lajoyce Corners, patient scheduled for right BKA tomorrow morning, arterial studies are no longer needed.  11/12/2020 3:32 PM Correen Bubolz RVT, RDMS

## 2020-11-12 NOTE — Progress Notes (Signed)
Family Medicine Teaching Service Daily Progress Note Intern Pager: [phone]  Patient name: Chad Hall Medical record number: [id number] Date of birth: 18-Nov-1950 Age: 70 y.o. Gender: male  Primary Care Provider: Evelena Leyden, DO Consultants: Orthopedic surgery, Vascular surgery Code Status: Full  Pt Overview and Major Events to Date:  Patient admitted - 11/11/20  Assessment and Plan: Chad Hall is a 70 y.o. male presenting with foot wound and concern for osteomyelitis. PMH is significant for blindness in both eyes, type 2 diabetes, hypertension, hyperlipidemia   Dorsal Right Foot Infection  MRI showed dorsal midfoot ulceration with no evidence of osteomyelitis or abscess. ABI's to be collected. Bcx grew, MRSA, Staph Epi, Strep agalactiae, Methicillin resistant mecA/C, meth resistant mecA/C and MREJ. Patient is on Vancomycin, Cefepime, and Flagyl. CBC showed elevated WBC to 12.2, Hgb 8.9. Vascular surgery recommends BKA by orthopedic surgery. -Orthopedic Surgery following, appreciate recs -Vascular Surgery following, appreciate recs -Continue Abx -Echocardiogram for concern of staph bacteremia -Repeat Bcx -BKA by Ortho -PT/OT -C-reactive protein: 21.2 elevated -UA: Glc > 500, Hgb small, Leukocytes trace -CBC: WBC 12.2 w/ Neut # 9.4, Hgb 8.9, Plt 510 -1st Bcx: MRSA, Staph Epi, Strep agalactiae, Methicillin resistance mecA/C, Meth resistant mecA/C and MREJ  AKI: Cr today 1.66, on admission it was 2.16, baseline 1.0-1.1. BUN 34 (39). Likely prerenal given BUN to Cr ratio greater than 20 -AM CMP  Fatigue  generalized weakness: Alb 2.0 today -Orthostatic vitals -Continuous cardiac monitoring   Type 2 diabetes Home meds include Jardiance 10 mg daily, Tradjenta 5 mg nightly. Most recent Hgb A1c 5.3 on 01/11/20, will check A1c. CBG range 134-144 -F/u A1c   -Continue Tradjenta -SSI  HTN Home medications include enalapril 20 mg daily, metoprolol succinate 12.5 mg  daily. BP 130's-140's/50-70's today -Continue metoprolol -Hold Enalapril in setting of AKI  Hyperkalemia Initial K of 5.3. EKG normal sinus with borderline prolonged PR interval, K 4.7 today.   Diabetic Neuropathy Taking Gabapentin 300 mg daily -Continue home gabapentin  HFpEF Most recent echo on 08/24/2018 with grade 1 diastolic dysfunction, normal left ventricular systolic function with EF of 60 to 65%  Thoracic aortic aneurysm Most recent CT chest on 09/04/2019 showing stable dilation and 3.8 cm in the ascending aorta  History of Blindness Patient blind since 1967 due to retinal detachment.  Lives with spouse who is also blind -Social work following, Community education officer  FEN/GI: Heart Healthy / Carb modified PPx: Heparin sub q  Dispo: Pending conversation with patient,  pending clinical improvement .    Subjective:  Patient has no complaints of pain, unless moving foot specific way.   Objective: Temp:  [98.6 F (37 C)-99.2 F (37.3 C)] 98.6 F (37 C) (09/27 0100) Pulse Rate:  [64-84] 68 (09/27 0515) Resp:  [12-29] 18 (09/27 0515) BP: (125-150)/(54-72) 135/62 (09/27 0515) SpO2:  [96 %-99 %] 98 % (09/27 0515) Weight:  [94 kg] 94 kg (09/26 0900) Physical Exam: General: African American male, well nourished, no acute signs of distress Cardiovascular: RRR, nrmg Respiratory: CTABL, no wheezes or stridor Abdomen: Soft, NT/ND Extremities: Pulses intact in UE, BKA on left, with bandaged wound on right foot  Laboratory: Recent Labs  Lab 11/11/20 0902  WBC 12.2*  HGB 8.9*  HCT 30.3*  PLT 510*   Recent Labs  Lab 11/11/20 0902  NA 135  K 5.3*  CL 104  CO2 23  BUN 39*  CREATININE 2.16*  CALCIUM 8.4*  GLUCOSE 231*     Imaging/Diagnostic Tests: X-ray  Foot Rt IMPRESSION: Heterogeneous lucency and cortical disruption in the base of the fifth metatarsal concerning for osteomyelitis.  MRI Foot Rt IMPRESSION: 1. Dorsal midfoot ulceration. No evidence of  osteomyelitis or abscess. 2. Moderate flexor hallucis longus tenosynovitis.  Bess Kinds, MD 11/12/2020, 7:59 AM PGY-1, [facility] Family Medicine FPTS Intern pager: [phone], text pages welcome

## 2020-11-12 NOTE — Progress Notes (Signed)
  Daily Progress Note   I discussed to the patient with Dr. Lajoyce Corners, orthopedic surgeon, this morning. Unfortunately, there is too much tissue loss for limb salvage. Pt will be offered a below knee amputation by orthopedic surgery.   I am available if any questions or concerns arise.    Fara Olden MD [facility] [phone] 11/12/2020  8:09 AM

## 2020-11-12 NOTE — Progress Notes (Signed)
  Echocardiogram 2D Echocardiogram has been performed.  Augustine Radar 11/12/2020, 11:05 AM

## 2020-11-12 NOTE — Consult Note (Signed)
[facility]    Date of Admission:  11/11/2020     Total days of antibiotics 2               Reason for Consult: Polymicrobial Bacteremia  Referring Provider: [employer] Primary Care Provider: Evelena Leyden, DO   ASSESSMENT:  Chad Hall is a 70 y/o AA male with worsening infected diabetic foot wound complicated by polymicrobial bacteremia with MRSA, MRSE, and Group B Streptococcus. Orthopedics planning for BKA as there is insufficient tissue for limb salvage. Narrow antibiotics to Daptomycin. Will need to repeat blood cultures and TTE has been ordered. Last A1c from 12/2019 was well controlled at 5.3. Will likely require at least 2 weeks of antibiotics following amputation with duration dependent upon any additional findings. ID will continue to follow.   PLAN:  Change antibiotics to Daptomycin.  Repeat blood culture.  TTE to check for endocarditis; may need TEE. Scheduled for BKA per Orthopedics Remaining medical and supportive care per primary team. ID will continue to follow.    Active Problems:   Osteomyelitis (HCC)   Polymicrobial bacterial infection    atorvastatin  80 mg Oral Daily   gabapentin  300 mg Oral Daily   heparin  5,000 Units Subcutaneous Q8H   insulin aspart  0-9 Units Subcutaneous TID WC   linagliptin  5 mg Oral QHS   metoprolol succinate  12.5 mg Oral QPM     HPI: Chad Hall is a 70 y.o. male with previous medical history of blindness due to basketball injury at age 11, diabetes, hypertension, and osteomyelitis of the left foot s/p left BKA in November 2021 presenting with generalized weakness and worsening right foot wound.   Chad Hall right foot wound began about 2-3 months ago as a callus and has progressively worsened. Right foot x-ray with heterogenous lucency and cortical disruption in the base of the fifth metatarsal concerning for osteomyelitis. MRI right foot with dorsal midfoot ulceration  and no evidence of osteomyelitis or abscess. Vascular Surgery consulted and noted maggots in the subcutaneous tissue on exam. Orthopedics indicated too much tissue loss for any limb salvage and below knee amputation is indicated.   Chad Hall has been afebrile since admission with WBC count of 12.2. Initially started on broad spectrum coverage and has received doses of vancomycin, cefepime, piperacillin-tazobactam and metronidazole. Blood cultures drawn are with polymicrobial bacteremia with MRSA, MRSE, and Streptococcus agalactiae. Chad Hall has had chills over the last couple of weeks. Has been performing wound care himself.    Review of Systems: Review of Systems  Constitutional:  Negative for chills, fever and weight loss.  Respiratory:  Negative for cough, shortness of breath and wheezing.   Cardiovascular:  Negative for chest pain and leg swelling.  Gastrointestinal:  Negative for abdominal pain, constipation, diarrhea, nausea and vomiting.  Skin:  Negative for rash.    Past Medical History:  Diagnosis Date   Allergy    Blindness - both eyes 1967   Basketball injury   Diabetes mellitus    Hernia    Hyperlipidemia    Hypertension    Left hip pain 04/10/2013   Marfan syndrome    Osteomyelitis of foot, left, acute (HCC) 12/26/2018   Right knee pain 10/04/2017    Social History   Tobacco Use   Smoking status: Former    Types: Cigarettes    Quit date: 02/14/1984    Years since quitting: 36.7   Smokeless tobacco: Never  Substance Use Topics   Alcohol use: No    Comment: Quit in 10/1983   Drug use: No    Family History  Problem Relation Age of Onset   Heart disease Mother    Breast cancer Sister     No Known Allergies  OBJECTIVE: Blood pressure 108/79, pulse 93, temperature 98.6 F (37 C), resp. rate (!) 23, weight 94 kg, SpO2 100 %.  Physical Exam Constitutional:      General: He is not in acute distress.    Appearance: He is well-developed.  HENT:      Ears:     Comments: Blind Cardiovascular:     Rate and Rhythm: Normal rate and regular rhythm.     Heart sounds: Normal heart sounds.  Pulmonary:     Effort: Pulmonary effort is normal.     Breath sounds: Normal breath sounds.  Skin:    General: Skin is warm and dry.     Comments: Foot with wound on dorsum of foot with absorbant pad on top.   Neurological:     Mental Status: He is alert and oriented to person, place, and time.  Psychiatric:        Behavior: Behavior normal.        Thought Content: Thought content normal.        Judgment: Judgment normal.    Lab Results Lab Results  Component Value Date   WBC 12.2 (H) 11/11/2020   HGB 8.9 (L) 11/11/2020   HCT 30.3 (L) 11/11/2020   MCV 96.2 11/11/2020   PLT 510 (H) 11/11/2020    Lab Results  Component Value Date   CREATININE 2.16 (H) 11/11/2020   BUN 39 (H) 11/11/2020   NA 135 11/11/2020   K 5.3 (H) 11/11/2020   CL 104 11/11/2020   CO2 23 11/11/2020    Lab Results  Component Value Date   ALT 41 01/03/2020   AST 78 (H) 01/03/2020   ALKPHOS 48 01/03/2020   BILITOT 0.6 01/03/2020     Microbiology: Recent Results (from the past 240 hour(s))  Blood culture (routine x 2)     Status: None (Preliminary result)   Collection Time: 11/11/20  8:53 AM   Specimen: BLOOD  Result Value Ref Range Status   Specimen Description BLOOD LEFT ANTECUBITAL  Final   Special Requests   Final    BOTTLES DRAWN AEROBIC AND ANAEROBIC Blood Culture adequate volume   Culture  Setup Time   Final    GRAM POSITIVE COCCI ANAEROBIC BOTTLE ONLY CRITICAL RESULT CALLED TO, READ BACK BY AND VERIFIED WITH: PHARMD JAMES LEDFORD 11/12/20@1 :29 BY TW IN BOTH AEROBIC AND ANAEROBIC BOTTLES    Culture   Final    GRAM POSITIVE COCCI TOO YOUNG TO READ Performed at [facility], [address] [place], [place] [address]    Report Status PENDING  Incomplete  Blood Culture ID Panel (Reflexed)     Status: Abnormal   Collection Time: 11/11/20  8:53 AM   Result Value Ref Range Status   Enterococcus faecalis NOT DETECTED NOT DETECTED Final   Enterococcus Faecium NOT DETECTED NOT DETECTED Final   Listeria monocytogenes NOT DETECTED NOT DETECTED Final   Staphylococcus species DETECTED (A) NOT DETECTED Final    Comment: CRITICAL RESULT CALLED TO, READ BACK BY AND VERIFIED WITH: PHARMD JAMES LEDFORD 11/12/20@1 :29 BY TW    Staphylococcus aureus (BCID) DETECTED (A) NOT DETECTED Final    Comment: Methicillin (oxacillin)-resistant Staphylococcus aureus (MRSA). MRSA is predictably resistant  to beta-lactam antibiotics (except ceftaroline). Preferred therapy is vancomycin unless clinically contraindicated. Patient requires contact precautions if  hospitalized. CRITICAL RESULT CALLED TO, READ BACK BY AND VERIFIED WITH: PHARMD JAMES LEDFORD 11/12/20@1 :29 BY TW    Staphylococcus epidermidis DETECTED (A) NOT DETECTED Final    Comment: CRITICAL RESULT CALLED TO, READ BACK BY AND VERIFIED WITH: PHARMD JAMES LEDFORD 11/12/20@1 :29 BY TW    Staphylococcus lugdunensis NOT DETECTED NOT DETECTED Final   Streptococcus species DETECTED (A) NOT DETECTED Final    Comment: CRITICAL RESULT CALLED TO, READ BACK BY AND VERIFIED WITH: PHARMD JAMES LEDFORD 11/12/20@1 :29 BY TW    Streptococcus agalactiae DETECTED (A) NOT DETECTED Final    Comment: CRITICAL RESULT CALLED TO, READ BACK BY AND VERIFIED WITH: PHARMD JAMES LEDFORD 11/12/20@1 :29 BY TW    Streptococcus pneumoniae NOT DETECTED NOT DETECTED Final   Streptococcus pyogenes NOT DETECTED NOT DETECTED Final   A.calcoaceticus-baumannii NOT DETECTED NOT DETECTED Final   Bacteroides fragilis NOT DETECTED NOT DETECTED Final   Enterobacterales NOT DETECTED NOT DETECTED Final   Enterobacter cloacae complex NOT DETECTED NOT DETECTED Final   Escherichia coli NOT DETECTED NOT DETECTED Final   Klebsiella aerogenes NOT DETECTED NOT DETECTED Final   Klebsiella oxytoca NOT DETECTED NOT DETECTED Final   Klebsiella pneumoniae NOT  DETECTED NOT DETECTED Final   Proteus species NOT DETECTED NOT DETECTED Final   Salmonella species NOT DETECTED NOT DETECTED Final   Serratia marcescens NOT DETECTED NOT DETECTED Final   Haemophilus influenzae NOT DETECTED NOT DETECTED Final   Neisseria meningitidis NOT DETECTED NOT DETECTED Final   Pseudomonas aeruginosa NOT DETECTED NOT DETECTED Final   Stenotrophomonas maltophilia NOT DETECTED NOT DETECTED Final   Candida albicans NOT DETECTED NOT DETECTED Final   Candida auris NOT DETECTED NOT DETECTED Final   Candida glabrata NOT DETECTED NOT DETECTED Final   Candida krusei NOT DETECTED NOT DETECTED Final   Candida parapsilosis NOT DETECTED NOT DETECTED Final   Candida tropicalis NOT DETECTED NOT DETECTED Final   Cryptococcus neoformans/gattii NOT DETECTED NOT DETECTED Final   Methicillin resistance mecA/C DETECTED (A) NOT DETECTED Final    Comment: CRITICAL RESULT CALLED TO, READ BACK BY AND VERIFIED WITH: PHAMRD JAMES LEDFORD 11/12/20@1 :29 BY TW    Meth resistant mecA/C and MREJ DETECTED (A) NOT DETECTED Final    Comment: CRITICAL RESULT CALLED TO, READ BACK BY AND VERIFIED WITH: PHARMD JAMES LEDFORD 11/12/20@1 :29 BY TW Performed at [facility], [address], [place], [place] [address]   SARS CORONAVIRUS 2 (TAT 6-24 HRS) Nasopharyngeal Nasopharyngeal Swab     Status: None   Collection Time: 11/11/20 12:55 PM   Specimen: Nasopharyngeal Swab  Result Value Ref Range Status   SARS Coronavirus 2 NEGATIVE NEGATIVE Final    Comment: (NOTE) SARS-CoV-2 target nucleic acids are NOT DETECTED.  The SARS-CoV-2 RNA is generally detectable in upper and lower respiratory specimens during the acute phase of infection. Negative results do not preclude SARS-CoV-2 infection, do not rule out co-infections with other pathogens, and should not be used as the sole basis for treatment or other patient management decisions. Negative results must be combined with clinical  observations, patient history, and epidemiological information. The expected result is Negative.  Fact Sheet for Patients: HairSlick.no  Fact Sheet for Healthcare Providers: quierodirigir.com  This test is not yet approved or cleared by the [place] FDA and  has been authorized for detection and/or diagnosis of SARS-CoV-2 by FDA under an Emergency Use Authorization (EUA). This EUA will  remain  in effect (meaning this test can be used) for the duration of the COVID-19 declaration under Se ction 564(b)(1) of the Act, 21 U.S.C. section 360bbb-3(b)(1), unless the authorization is terminated or revoked sooner.  Performed at [facility], [address] [place], [place] [address]      Marcos Eke, NP [facility]  11/12/2020  9:30 AM

## 2020-11-13 ENCOUNTER — Encounter (HOSPITAL_COMMUNITY): Payer: Medicare Other

## 2020-11-13 ENCOUNTER — Inpatient Hospital Stay (HOSPITAL_COMMUNITY): Payer: Medicare Other | Admitting: Anesthesiology

## 2020-11-13 ENCOUNTER — Encounter (HOSPITAL_COMMUNITY)
Admission: EM | Disposition: A | Discharge status: Home Health | Payer: Self-pay | Source: Home / Self Care | Attending: Family Medicine

## 2020-11-13 ENCOUNTER — Encounter (HOSPITAL_COMMUNITY): Payer: Self-pay | Admitting: Family Medicine

## 2020-11-13 DIAGNOSIS — I739 Peripheral vascular disease, unspecified: Secondary | ICD-10-CM | POA: Diagnosis not present

## 2020-11-13 DIAGNOSIS — B879 Myiasis, unspecified: Secondary | ICD-10-CM

## 2020-11-13 DIAGNOSIS — R531 Weakness: Secondary | ICD-10-CM | POA: Diagnosis not present

## 2020-11-13 DIAGNOSIS — D72829 Elevated white blood cell count, unspecified: Secondary | ICD-10-CM | POA: Diagnosis not present

## 2020-11-13 DIAGNOSIS — I96 Gangrene, not elsewhere classified: Secondary | ICD-10-CM | POA: Diagnosis not present

## 2020-11-13 DIAGNOSIS — A499 Bacterial infection, unspecified: Secondary | ICD-10-CM | POA: Diagnosis not present

## 2020-11-13 DIAGNOSIS — T148XXA Other injury of unspecified body region, initial encounter: Secondary | ICD-10-CM | POA: Diagnosis not present

## 2020-11-13 DIAGNOSIS — Z89512 Acquired absence of left leg below knee: Secondary | ICD-10-CM | POA: Diagnosis not present

## 2020-11-13 DIAGNOSIS — E43 Unspecified severe protein-calorie malnutrition: Secondary | ICD-10-CM

## 2020-11-13 HISTORY — PX: AMPUTATION: SHX166

## 2020-11-13 LAB — GLUCOSE, CAPILLARY
Glucose-Capillary: 105 mg/dL — ABNORMAL HIGH (ref 70–99)
Glucose-Capillary: 101 mg/dL — ABNORMAL HIGH (ref 70–99)
Glucose-Capillary: 117 mg/dL — ABNORMAL HIGH (ref 70–99)
Glucose-Capillary: 113 mg/dL — ABNORMAL HIGH (ref 70–99)
Glucose-Capillary: 138 mg/dL — ABNORMAL HIGH (ref 70–99)
Glucose-Capillary: 200 mg/dL — ABNORMAL HIGH (ref 70–99)

## 2020-11-13 LAB — COMPREHENSIVE METABOLIC PANEL
ALT: 32 U/L (ref 0–44)
AST: 37 U/L (ref 15–41)
Albumin: 1.8 g/dL — ABNORMAL LOW (ref 3.5–5.0)
Alkaline Phosphatase: 51 U/L (ref 38–126)
Anion gap: 7 (ref 5–15)
BUN: 36 mg/dL — ABNORMAL HIGH (ref 8–23)
CO2: 21 mmol/L — ABNORMAL LOW (ref 22–32)
Calcium: 8.3 mg/dL — ABNORMAL LOW (ref 8.9–10.3)
Chloride: 108 mmol/L (ref 98–111)
Creatinine, Ser: 1.6 mg/dL — ABNORMAL HIGH (ref 0.61–1.24)
GFR, Estimated: 45 mL/min — ABNORMAL LOW (ref >60)
Glucose, Bld: 119 mg/dL — ABNORMAL HIGH (ref 70–99)
Potassium: 4.8 mmol/L (ref 3.5–5.1)
Sodium: 136 mmol/L (ref 135–145)
Total Bilirubin: 0.5 mg/dL (ref 0.3–1.2)
Total Protein: 6.5 g/dL (ref 6.5–8.1)

## 2020-11-13 LAB — CBC
HCT: 26.8 % — ABNORMAL LOW (ref 39.0–52.0)
HCT: 33.4 % — ABNORMAL LOW (ref 39.0–52.0)
Hemoglobin: 7.9 g/dL — ABNORMAL LOW (ref 13.0–17.0)
Hemoglobin: 10.1 g/dL — ABNORMAL LOW (ref 13.0–17.0)
MCH: 27.6 pg (ref 26.0–34.0)
MCH: 27.7 pg (ref 26.0–34.0)
MCHC: 29.5 g/dL — ABNORMAL LOW (ref 30.0–36.0)
MCHC: 30.2 g/dL (ref 30.0–36.0)
MCV: 93.7 fL (ref 80.0–100.0)
MCV: 91.8 fL (ref 80.0–100.0)
Platelets: 474 10*3/uL — ABNORMAL HIGH (ref 150–400)
Platelets: 494 10*3/uL — ABNORMAL HIGH (ref 150–400)
RBC: 2.86 MIL/uL — ABNORMAL LOW (ref 4.22–5.81)
RBC: 3.64 MIL/uL — ABNORMAL LOW (ref 4.22–5.81)
RDW: 12.2 % (ref 11.5–15.5)
RDW: 12.7 % (ref 11.5–15.5)
WBC: 9.2 10*3/uL (ref 4.0–10.5)
WBC: 9.9 10*3/uL (ref 4.0–10.5)
nRBC: 0 % (ref 0.0–0.2)
nRBC: 0 % (ref 0.0–0.2)

## 2020-11-13 LAB — ABO/RH: ABO/RH(D): O POS

## 2020-11-13 LAB — PREPARE RBC (CROSSMATCH)

## 2020-11-13 LAB — SURGICAL PCR SCREEN
MRSA, PCR: POSITIVE — AB
Staphylococcus aureus: POSITIVE — AB

## 2020-11-13 SURGERY — AMPUTATION BELOW KNEE
Anesthesia: Regional | Site: Knee | Laterality: Right

## 2020-11-13 MED ORDER — DEXAMETHASONE SODIUM PHOSPHATE 10 MG/ML IJ SOLN
INTRAMUSCULAR | Status: DC | PRN
  Administered 2020-11-13: 5 mg

## 2020-11-13 MED ORDER — BUPIVACAINE LIPOSOME 1.3 % IJ SUSP: INTRAMUSCULAR | Status: DC | PRN

## 2020-11-13 MED ORDER — MUPIROCIN 2 % EX OINT
1.0000 "application " | TOPICAL_OINTMENT | Freq: Two times a day (BID) | CUTANEOUS | Status: AC
  Administered 2020-11-13 – 2020-11-17 (×9): 1 via NASAL
  Filled 2020-11-13 (×4): qty 22

## 2020-11-13 MED ORDER — ACETAMINOPHEN 500 MG PO TABS
1000.0000 mg | ORAL_TABLET | Freq: Once | ORAL | Status: AC
  Administered 2020-11-13: 1000 mg via ORAL
  Filled 2020-11-13: qty 2

## 2020-11-13 MED ORDER — MIDAZOLAM HCL 2 MG/2ML IJ SOLN
2.0000 mg | Freq: Once | INTRAMUSCULAR | Status: AC
  Filled 2020-11-13: qty 2

## 2020-11-13 MED ORDER — ONDANSETRON HCL 4 MG/2ML IJ SOLN
INTRAMUSCULAR | Status: DC | PRN
  Administered 2020-11-13: 4 mg via INTRAVENOUS

## 2020-11-13 MED ORDER — JUVEN PO PACK
1.0000 | PACK | Freq: Two times a day (BID) | ORAL | Status: DC
Start: 2020-11-13 — End: 2020-11-15
  Administered 2020-11-13 – 2020-11-14 (×3): 1 via ORAL
  Filled 2020-11-13 (×3): qty 1

## 2020-11-13 MED ORDER — ALUM & MAG HYDROXIDE-SIMETH 200-200-20 MG/5ML PO SUSP: 15.0000 mL | ORAL | Status: DC | PRN

## 2020-11-13 MED ORDER — MIDAZOLAM HCL 2 MG/2ML IJ SOLN
INTRAMUSCULAR | Status: AC
  Administered 2020-11-13: 2 mg via INTRAVENOUS
  Filled 2020-11-13: qty 2

## 2020-11-13 MED ORDER — PANTOPRAZOLE SODIUM 40 MG PO TBEC
40.0000 mg | DELAYED_RELEASE_TABLET | Freq: Every day | ORAL | Status: DC
  Administered 2020-11-13 – 2020-11-18 (×6): 40 mg via ORAL
  Filled 2020-11-13 (×6): qty 1

## 2020-11-13 MED ORDER — GUAIFENESIN-DM 100-10 MG/5ML PO SYRP: 15.0000 mL | ORAL_SOLUTION | ORAL | Status: DC | PRN

## 2020-11-13 MED ORDER — BUPIVACAINE LIPOSOME 1.3 % IJ SUSP
INTRAMUSCULAR | Status: DC | PRN
  Administered 2020-11-13: 10 mL via PERINEURAL

## 2020-11-13 MED ORDER — POTASSIUM CHLORIDE CRYS ER 20 MEQ PO TBCR: 20.0000 meq | EXTENDED_RELEASE_TABLET | Freq: Every day | ORAL | Status: DC | PRN

## 2020-11-13 MED ORDER — PHENYLEPHRINE 40 MCG/ML (10ML) SYRINGE FOR IV PUSH (FOR BLOOD PRESSURE SUPPORT)
PREFILLED_SYRINGE | INTRAVENOUS | Status: DC | PRN
  Administered 2020-11-13 (×2): 160 ug via INTRAVENOUS

## 2020-11-13 MED ORDER — PHENOL 1.4 % MT LIQD: 1.0000 | OROMUCOSAL | Status: DC | PRN

## 2020-11-13 MED ORDER — FENTANYL CITRATE (PF) 100 MCG/2ML IJ SOLN
25.0000 ug | INTRAMUSCULAR | Status: DC | PRN
  Administered 2020-11-13 (×2): 25 ug via INTRAVENOUS

## 2020-11-13 MED ORDER — SODIUM CHLORIDE 0.9% IV SOLUTION: Freq: Once | INTRAVENOUS | Status: AC

## 2020-11-13 MED ORDER — ROPIVACAINE HCL 5 MG/ML IJ SOLN
INTRAMUSCULAR | Status: DC | PRN
  Administered 2020-11-13: 20 mL via PERINEURAL

## 2020-11-13 MED ORDER — CHLORHEXIDINE GLUCONATE 4 % EX LIQD: 60.0000 mL | Freq: Once | CUTANEOUS | Status: DC

## 2020-11-13 MED ORDER — CHLORHEXIDINE GLUCONATE 0.12 % MT SOLN
OROMUCOSAL | Status: AC
  Administered 2020-11-13: 15 mL
  Filled 2020-11-13: qty 15

## 2020-11-13 MED ORDER — LACTATED RINGERS IV SOLN: INTRAVENOUS | Status: DC

## 2020-11-13 MED ORDER — POVIDONE-IODINE 10 % EX SWAB: 2.0000 "application " | Freq: Once | CUTANEOUS | Status: DC

## 2020-11-13 MED ORDER — KETAMINE HCL 10 MG/ML IJ SOLN
INTRAMUSCULAR | Status: DC | PRN
  Administered 2020-11-13: 40 mg via INTRAVENOUS
  Administered 2020-11-13: 10 mg via INTRAVENOUS

## 2020-11-13 MED ORDER — BUPIVACAINE HCL (PF) 0.5 % IJ SOLN
INTRAMUSCULAR | Status: DC | PRN
  Administered 2020-11-13: 20 mL via PERINEURAL

## 2020-11-13 MED ORDER — FENTANYL CITRATE (PF) 100 MCG/2ML IJ SOLN
INTRAMUSCULAR | Status: AC
  Filled 2020-11-13: qty 2

## 2020-11-13 MED ORDER — POLYETHYLENE GLYCOL 3350 17 G PO PACK: 17.0000 g | PACK | Freq: Every day | ORAL | Status: DC | PRN

## 2020-11-13 MED ORDER — KETAMINE HCL 50 MG/5ML IJ SOSY
PREFILLED_SYRINGE | INTRAMUSCULAR | Status: AC
  Filled 2020-11-13: qty 5

## 2020-11-13 MED ORDER — CEFAZOLIN SODIUM-DEXTROSE 2-4 GM/100ML-% IV SOLN
2.0000 g | INTRAVENOUS | Status: AC
  Administered 2020-11-13: 2 g via INTRAVENOUS
  Filled 2020-11-13: qty 100

## 2020-11-13 MED ORDER — SODIUM CHLORIDE 0.9 % IV SOLN: INTRAVENOUS | Status: DC

## 2020-11-13 MED ORDER — MAGNESIUM CITRATE PO SOLN: 1.0000 | Freq: Once | ORAL | Status: DC | PRN

## 2020-11-13 MED ORDER — LIDOCAINE 2% (20 MG/ML) 5 ML SYRINGE
INTRAMUSCULAR | Status: DC | PRN
  Administered 2020-11-13: 20 mg via INTRAVENOUS

## 2020-11-13 MED ORDER — OXYCODONE HCL 5 MG PO TABS
5.0000 mg | ORAL_TABLET | ORAL | Status: DC | PRN
  Administered 2020-11-13 – 2020-11-14 (×3): 5 mg via ORAL
  Filled 2020-11-13 (×3): qty 1
  Filled 2020-11-13: qty 2

## 2020-11-13 MED ORDER — ONDANSETRON HCL 4 MG/2ML IJ SOLN: 4.0000 mg | Freq: Four times a day (QID) | INTRAMUSCULAR | Status: DC | PRN

## 2020-11-13 MED ORDER — 0.9 % SODIUM CHLORIDE (POUR BTL) OPTIME
TOPICAL | Status: DC | PRN
  Administered 2020-11-13: 1000 mL

## 2020-11-13 MED ORDER — TRANEXAMIC ACID-NACL 1000-0.7 MG/100ML-% IV SOLN
1000.0000 mg | Freq: Once | INTRAVENOUS | Status: AC
  Administered 2020-11-13: 1000 mg via INTRAVENOUS
  Filled 2020-11-13: qty 100

## 2020-11-13 MED ORDER — ASCORBIC ACID 500 MG PO TABS
1000.0000 mg | ORAL_TABLET | Freq: Every day | ORAL | Status: DC
  Administered 2020-11-13 – 2020-11-18 (×6): 1000 mg via ORAL
  Filled 2020-11-13 (×6): qty 2

## 2020-11-13 MED ORDER — BISACODYL 5 MG PO TBEC: 5.0000 mg | DELAYED_RELEASE_TABLET | Freq: Every day | ORAL | Status: DC | PRN

## 2020-11-13 MED ORDER — CEFAZOLIN SODIUM-DEXTROSE 2-4 GM/100ML-% IV SOLN
2.0000 g | Freq: Three times a day (TID) | INTRAVENOUS | Status: AC
  Administered 2020-11-13 – 2020-11-14 (×2): 2 g via INTRAVENOUS
  Filled 2020-11-13 (×2): qty 100

## 2020-11-13 MED ORDER — FENTANYL CITRATE PF 50 MCG/ML IJ SOSY
50.0000 ug | PREFILLED_SYRINGE | Freq: Once | INTRAMUSCULAR | Status: AC
  Administered 2020-11-13: 50 ug via INTRAVENOUS
  Filled 2020-11-13: qty 1

## 2020-11-13 MED ORDER — HYDROMORPHONE HCL 1 MG/ML IJ SOLN: 0.5000 mg | INTRAMUSCULAR | Status: DC | PRN

## 2020-11-13 MED ORDER — DOCUSATE SODIUM 100 MG PO CAPS
100.0000 mg | ORAL_CAPSULE | Freq: Every day | ORAL | Status: DC
  Administered 2020-11-14 – 2020-11-18 (×5): 100 mg via ORAL
  Filled 2020-11-13 (×5): qty 1

## 2020-11-13 MED ORDER — PROPOFOL 10 MG/ML IV BOLUS
INTRAVENOUS | Status: DC | PRN
  Administered 2020-11-13: 150 mg via INTRAVENOUS

## 2020-11-13 MED ORDER — ZINC SULFATE 220 (50 ZN) MG PO CAPS
220.0000 mg | ORAL_CAPSULE | Freq: Every day | ORAL | Status: DC
  Administered 2020-11-13 – 2020-11-18 (×6): 220 mg via ORAL
  Filled 2020-11-13 (×6): qty 1

## 2020-11-13 MED ORDER — PROPOFOL 10 MG/ML IV BOLUS
INTRAVENOUS | Status: AC
  Filled 2020-11-13: qty 20

## 2020-11-13 MED ORDER — HEPARIN SODIUM (PORCINE) 5000 UNIT/ML IJ SOLN
5000.0000 [IU] | Freq: Three times a day (TID) | INTRAMUSCULAR | Status: DC
  Administered 2020-11-15 – 2020-11-18 (×11): 5000 [IU] via SUBCUTANEOUS
  Filled 2020-11-13 (×11): qty 1

## 2020-11-13 SURGICAL SUPPLY — 36 items
BAG COUNTER SPONGE SURGICOUNT (BAG) IMPLANT
BLADE SAW RECIP 87.9 MT (BLADE) ×2 IMPLANT
BLADE SURG 21 STRL SS (BLADE) ×2 IMPLANT
BNDG COHESIVE 6X5 TAN STRL LF (GAUZE/BANDAGES/DRESSINGS) IMPLANT
CANISTER WOUND CARE 500ML ATS (WOUND CARE) ×2 IMPLANT
COVER SURGICAL LIGHT HANDLE (MISCELLANEOUS) ×2 IMPLANT
CUFF TOURN SGL QUICK 34 (TOURNIQUET CUFF) ×2
CUFF TRNQT CYL 34X4.125X (TOURNIQUET CUFF) ×1 IMPLANT
DRAPE INCISE IOBAN 66X45 STRL (DRAPES) ×2 IMPLANT
DRAPE U-SHAPE 47X51 STRL (DRAPES) ×2 IMPLANT
DRESSING PREVENA PLUS CUSTOM (GAUZE/BANDAGES/DRESSINGS) ×1 IMPLANT
DRSG PREVENA PLUS CUSTOM (GAUZE/BANDAGES/DRESSINGS) ×2
DURAPREP 26ML APPLICATOR (WOUND CARE) ×2 IMPLANT
ELECT REM PT RETURN 9FT ADLT (ELECTROSURGICAL) ×2
ELECTRODE REM PT RTRN 9FT ADLT (ELECTROSURGICAL) ×1 IMPLANT
GLOVE SURG ORTHO LTX SZ9 (GLOVE) ×2 IMPLANT
GLOVE SURG UNDER POLY LF SZ9 (GLOVE) ×2 IMPLANT
GOWN STRL REUS W/ TWL XL LVL3 (GOWN DISPOSABLE) ×2 IMPLANT
GOWN STRL REUS W/TWL XL LVL3 (GOWN DISPOSABLE) ×4
KIT BASIN OR (CUSTOM PROCEDURE TRAY) ×2 IMPLANT
KIT TURNOVER KIT B (KITS) ×2 IMPLANT
MANIFOLD NEPTUNE II (INSTRUMENTS) ×2 IMPLANT
NS IRRIG 1000ML POUR BTL (IV SOLUTION) ×2 IMPLANT
PACK ORTHO EXTREMITY (CUSTOM PROCEDURE TRAY) ×2 IMPLANT
PAD ARMBOARD 7.5X6 YLW CONV (MISCELLANEOUS) ×2 IMPLANT
PREVENA RESTOR ARTHOFORM 46X30 (CANNISTER) ×2 IMPLANT
SPONGE T-LAP 18X18 ~~LOC~~+RFID (SPONGE) IMPLANT
STAPLER VISISTAT 35W (STAPLE) IMPLANT
STOCKINETTE IMPERVIOUS LG (DRAPES) ×2 IMPLANT
SUT ETHILON 2 0 PSLX (SUTURE) IMPLANT
SUT SILK 2 0 (SUTURE) ×2
SUT SILK 2-0 18XBRD TIE 12 (SUTURE) ×1 IMPLANT
SUT VIC AB 1 CTX 27 (SUTURE) ×4 IMPLANT
TOWEL GREEN STERILE (TOWEL DISPOSABLE) ×2 IMPLANT
TUBE CONNECTING 12X1/4 (SUCTIONS) ×2 IMPLANT
YANKAUER SUCT BULB TIP NO VENT (SUCTIONS) ×2 IMPLANT

## 2020-11-13 NOTE — Anesthesia Preprocedure Evaluation (Addendum)
Anesthesia Evaluation  Patient identified by MRN, date of birth, ID band Patient awake    Reviewed: Allergy & Precautions, NPO status , Patient's Chart, lab work & pertinent test results, reviewed documented beta blocker date and time   Airway Mallampati: III  TM Distance: >3 FB Neck ROM: Full    Dental  (+) Chipped, Poor Dentition, Dental Advisory Given,    Pulmonary neg pulmonary ROS, former smoker,    Pulmonary exam normal breath sounds clear to auscultation       Cardiovascular hypertension, Pt. on home beta blockers and Pt. on medications + Peripheral Vascular Disease  Normal cardiovascular exam Rhythm:Regular Rate:Normal  HLD  TTE 2022 1. Left ventricular ejection fraction, by estimation, is 60 to 65%. The  left ventricle has normal function. The left ventricle has no regional  wall motion abnormalities. There is mild left ventricular hypertrophy.  Left ventricular diastolic parameters  are consistent with Grade I diastolic dysfunction (impaired relaxation).  2. Right ventricular systolic function is normal. The right ventricular  size is normal. There is normal pulmonary artery systolic pressure.  3. The mitral valve is normal in structure. No evidence of mitral valve  regurgitation. No evidence of mitral stenosis.  4. The aortic valve is normal in structure. Aortic valve regurgitation is  not visualized. No aortic stenosis is present.  5. Aortic dilatation noted. There is moderate dilatation of the aortic  root, measuring 47 mm.  6. The inferior vena cava is normal in size with greater than 50%  respiratory variability, suggesting right atrial pressure of 3 mmHg   Neuro/Psych negative neurological ROS  negative psych ROS   GI/Hepatic negative GI ROS, Neg liver ROS,   Endo/Other  diabetes, Type 2, Oral Hypoglycemic Agents  Renal/GU negative Renal ROS  negative genitourinary   Musculoskeletal negative  musculoskeletal ROS (+) Marfan's syndrome   Abdominal   Peds  Hematology  (+) Blood dyscrasia (Hgb 7.9, currently receiving 1U RBCs), anemia ,   Anesthesia Other Findings   Reproductive/Obstetrics                            Anesthesia Physical Anesthesia Plan  ASA: 3  Anesthesia Plan: General and Regional   Post-op Pain Management:  Regional for Post-op pain   Induction: Intravenous  PONV Risk Score and Plan: 2 and Ondansetron, Dexamethasone and Midazolam  Airway Management Planned: LMA  Additional Equipment:   Intra-op Plan:   Post-operative Plan: Extubation in OR  Informed Consent: I have reviewed the patients History and Physical, chart, labs and discussed the procedure including the risks, benefits and alternatives for the proposed anesthesia with the patient or authorized representative who has indicated his/her understanding and acceptance.     Dental advisory given  Plan Discussed with: CRNA  Anesthesia Plan Comments:         Anesthesia Quick Evaluation

## 2020-11-13 NOTE — Plan of Care (Signed)

## 2020-11-13 NOTE — Progress Notes (Addendum)
Family Medicine Teaching Service Daily Progress Note Intern Pager: [phone]  Patient name: Chad Hall Medical record number: [id number] Date of birth: May 15, 1950 Age: 70 y.o. Gender: male  Primary Care Provider: Evelena Leyden, DO Consultants: Orthopedic surgery, Vascular surgery Code Status: Full  Pt Overview and Major Events to Date:  Patient admitted - 11/11/20  Assessment and Plan: Chad Hall is a 70 y.o. male presenting with foot wound and concern for osteomyelitis. PMH is significant for blindness in both eyes, type 2 diabetes, hypertension, hyperlipidemia   Gangrene Right Foot  Patient to receive BKA today.  -Orthopedic surgery following, appreciate recs -Vascular surgery following, appreciate recs -Antibiotics switched to daptomycin (9/27-9/28) and cefazolin (9/28), from Vanc (9/26-9/27), Cefepime (9/26-9/27), and Flagyl (9/26-9/27), Zosyn (9/26) -PT/OT  AKI Creatinine today 1.60 (1.66, 2.16), on admission was 2.16, baseline 1.0-1.1.  BUN 36 today, (34, 39)  Fatigue  generalized weakness: -PT/OT following  DM2 Home medications include Jardiance 10 mg daily, Tradjenta 5 mg nightly.  Most recent A1c 6.9 on 11/12/20 CBG range 100-131  HTN Home medications include enalapril 20 mg, metoprolol succinate 12.5 mg daily.  BPs 120-150's/40-70's overnight -Continue metoprolol -Hold enalapril in setting of AKI  Diabetic Neuropathy -Continue gabapentin 300 mg daily, renally adjusted for AKI  HFpEF Most recent echo on 11/12/20 with grade 1 diastolic dysfunction, normal left ventricular systolic function with a EF of 60 to 65%  Thoracic aortic aneurysm Most recent CT chest on 09/04/2019 showing stable dilation and 3.8 cm in the ascending aorta  History of Blindness Patient blind since 1967 due to retinal detachment.  Lives with spouse who is also -Social work following, Community education officer  FEN/GI: Heart Healthy / Carb modified PPx: Heparin Sub Q Dispo: Pending  conversation with patient  pending clinical improvement .   Subjective:  No complaints, no acute signs of distress. Patient still intending to go home with PT/Home health after surgery, expresses interest in having an aid who can help with preparing meals, and baths.  Objective: Temp:  [97.8 F (36.6 C)-99.1 F (37.3 C)] 98.2 F (36.8 C) (09/27 2048) Pulse Rate:  [78-104] 88 (09/27 2048) Resp:  [15-23] 17 (09/27 2048) BP: (108-162)/(49-82) 132/64 (09/27 2048) SpO2:  [95 %-100 %] 100 % (09/27 2048) Weight:  [95.3 kg] 95.3 kg (09/27 1550) Physical Exam: General: African American male, well nourished, no acute signs of distress, resting comfortably in bed Cardiovascular: RRR, nrmg Respiratory: CTABL, no wheezes or stridor Abdomen: Soft, NT/ND Extremities: Pulses intact in UE, BKA on left leg, bandaged wound on right foot  Laboratory: Recent Labs  Lab 11/11/20 0902 11/12/20 0948 11/13/20 0329  WBC 12.2* 12.2* 9.2  HGB 8.9* 8.9* 7.9*  HCT 30.3* 30.0* 26.8*  PLT 510* 520* 474*   Recent Labs  Lab 11/11/20 0902 11/12/20 0948 11/13/20 0329  NA 135 136 136  K 5.3* 4.7 4.8  CL 104 106 108  CO2 23 22 21*  BUN 39* 34* 36*  CREATININE 2.16* 1.66* 1.60*  CALCIUM 8.4* 8.6* 8.3*  PROT  --  7.4 6.5  BILITOT  --  0.7 0.5  ALKPHOS  --  52 51  ALT  --  30 32  AST  --  33 37  GLUCOSE 231* 136* 119*     Imaging/Diagnostic Tests:   Bess Kinds, MD 11/13/2020, 7:19 AM PGY-1, [facility] FPTS Intern pager: [phone], text pages welcome

## 2020-11-13 NOTE — Op Note (Signed)
   Date of Surgery: 11/13/2020  INDICATIONS: Mr. Boquet is a 70 y.o.-year-old male who is status post a left below-knee amputation.  Patient presents with gangrenous ulcers dorsally over the entire midfoot as well as necrotic ulceration around the ankle with maggot infestation.  Patient does not have revascularization options and presents for transtibial amputation on the right.Marland Kitchen  PREOPERATIVE DIAGNOSIS: Gangrene abscess right foot with maggot infestation.  POSTOPERATIVE DIAGNOSIS: Same.  PROCEDURE: Transtibial amputation Application of Prevena wound VAC  SURGEON: Lajoyce Corners, M.D.  ANESTHESIA:  general  IV FLUIDS AND URINE: See anesthesia records.  ESTIMATED BLOOD LOSS: See anesthesia records.  COMPLICATIONS: None.  DESCRIPTION OF PROCEDURE: The patient was brought to the operating room after undergoing regional anesthetic. After adequate levels of anesthesia were obtained patient's lower extremity was prepped using DuraPrep draped into a sterile field. A timeout was called. The foot was draped out of the sterile field with impervious stockinette. A transverse incision was made 11 cm distal to the tibial tubercle. This curved proximally and a large posterior flap was created. The tibia was transected 1 cm proximal to the skin incision. The fibula was transected just proximal to the tibial incision. The tibia was beveled anteriorly. A large posterior flap was created. The sciatic nerve was pulled cut and allowed to retract. The vascular bundles were suture ligated with 2-0 silk. The deep and superficial fascial layers were closed using #1 Vicryl. The skin was closed using staples and 2-0 nylon. The wound was covered with a Prevena customizable and arthroform wound VAC.  The dressing was sealed with dermatac there was a good suction fit. A prosthetic shrinker and limb protector were applied. Patient was taken to the PACU in stable condition.   DISCHARGE PLANNING:  Antibiotic duration: 24  hours  Weightbearing: Nonweightbearing on the operative extremity  Pain medication: Opioid pathway  Dressing care/ Wound VAC: Continue wound VAC for 1 week after discharge  Discharge to: Discharge planning based on therapy's recommendations for possible inpatient rehabilitation, outpatient rehabilitation, or discharge to home with therapy  Follow-up: In the office 1 week post operative.  Aldean Baker, MD [facility] 2:30 PM

## 2020-11-13 NOTE — TOC Initial Note (Addendum)
Transition of Care [facility]) - Initial/Assessment Note    Patient Details  Name: Chad Hall MRN: [id number] Date of Birth: 13-Dec-1950  Transition [facility] Care [facility]) CM/SW Contact:    Epifanio Lesches, RN Phone Number: 11/13/2020, 2:57 PM  Clinical Narrative:                     Present with R foot wound/ Osteomyelitis of the right foot.Hx of  blindness in both eyes, type 2 diabetes, hypertension, hyperlipidemia,  LEFT BELOW KNEE AMPUTATION 01/03/2020.           - s/p R BKA, 11/13/2020, application of Prevena wound VAC  NCM spoke with pt @ bedside regarding d/c planning. Pt states from home with Lynden Ang (wife) who is also blind. State he would like to d/c to home once medically ready. States not interested in CIR or ST-SNF. States wife will assist with care once d/c.States agreeable to home health services. Pt without preference for [facility] agency.    Pt  already with DME : rolling walker, W/C, BSC., prothesis for L leg (needs to take to [facility] for "tune up/adjustments")  REYHAN MORONTA (Spouse)      [phone]      Consults received: 1.Patient wants to go home with home health. Does not want snf  2. Can you please evaluate patient for medicaid? He and his wife are blind and would significantly benefit from personal care services and [facility] RN for meds.  NCM made referral with [facility] for home health services( RN, PT,OT ,SW), acceptance pending. Home health order and face to face will be needed for home health services from MD/provider.   Referral made with Financial Navigator / Christia Reading [phone]) for Medicaid Screening.  Pt utilizes [facility] services [phone] )    for MD appointments.  PT/OT evaluations pending.Marland KitchenMarland KitchenNCM will continue to monitor and assist with TOC needs   11/14/2020 @ 0845 Call received from Amy/[facility] and informed pt accepted for home health services.  Patient Goals and CMS Choice        Expected Discharge  Plan and Services   In-house Referral: Financial Counselor (Referral made with Christia Reading ( financial navigator) for Medicaid Screening)                                 HH Arranged: RN, PT, OT, Social Work [employer] Agency: [facility] Date [facility] Agency Contacted: 11/13/20 Time HH Agency Contacted: 1451 Representative spoke with at [facility] Agency: Amy  Prior Living Arrangements/Services                       Activities of Daily Living Home Assistive Devices/Equipment: Prosthesis ADL Screening (condition at time of admission) Patient's cognitive ability adequate to safely complete daily activities?: Yes Is the patient deaf or have difficulty hearing?: No Does the patient have difficulty seeing, even when wearing glasses/contacts?: Yes Does the patient have difficulty concentrating, remembering, or making decisions?: No Patient able to express need for assistance with ADLs?: Yes Does the patient have difficulty dressing or bathing?: No Independently performs ADLs?: Yes (appropriate for developmental age) Does the patient have difficulty walking or climbing stairs?: No Weakness of Legs: None Weakness of Arms/Hands: None  Permission Sought/Granted                  Emotional Assessment  Admission diagnosis:  Weakness [R53.1] Osteomyelitis (HCC) [M86.9] Wound infection [T14.8XXA, L08.9] Leukocytosis, unspecified type [D72.829] Patient Active Problem List   Diagnosis Date Noted   Polymicrobial bacterial infection 11/12/2020   Gangrene of right foot (HCC)    PVD (peripheral vascular disease) (HCC)    Severe protein-calorie malnutrition (HCC)    Osteomyelitis (HCC) 11/11/2020   Maggot infestation 08/23/2019   Diabetic peripheral neuropathy associated with type 2 diabetes mellitus (HCC) 11/10/2012   Marfan's syndrome 08/08/2012   Atopic dermatitis 02/16/2012   BMI 24.0-24.9, adult 07/05/2011   Allergic rhinitis due to allergen 07/03/2011    Venous insufficiency of leg 04/03/2011   Blindness 07/23/2010   DM (diabetes mellitus), type 2 with neurological complications (HCC) 06/13/2010   Hypertension 06/13/2010   Hyperlipidemia 06/13/2010   PCP:  Evelena Leyden, DO Pharmacy:   [address] - [place], [place] - [address] [place] [address] Phone: [phone] Fax: [phone]  [facility]  [facility] Delivery) - [place], [place] - [address] [facility] [address] [place] [address] [place] [address] Phone: [phone] Fax: [phone]  [id number] - [place], [place] - [address] [place] [address] Phone: [phone] Fax: [phone]  [facility] ([facility]) - [place], [place] - [address] [place] [address] Phone: [phone] Fax: [phone]     Social Determinants of Health (SDOH) Interventions    Readmission Risk Interventions Readmission Risk Prevention Plan 01/04/2020 12/29/2018  Post Dischage Appt Complete Not Complete  Appt Comments - dispo pending  Medication Screening Complete Complete  Transportation Screening Complete Complete  Some recent data might be hidden

## 2020-11-13 NOTE — Anesthesia Postprocedure Evaluation (Signed)
Anesthesia Post Note  Patient: Chad Hall  Procedure(s) Performed: RIGHT BELOW KNEE AMPUTATION (Right: Knee)     Patient location during evaluation: PACU Anesthesia Type: Regional and General Level of consciousness: awake and alert Pain management: pain level controlled Vital Signs Assessment: post-procedure vital signs reviewed and stable Respiratory status: spontaneous breathing, nonlabored ventilation, respiratory function stable and patient connected to nasal cannula oxygen Cardiovascular status: blood pressure returned to baseline and stable Postop Assessment: no apparent nausea or vomiting Anesthetic complications: no   No notable events documented.  Last Vitals:  Vitals:   11/13/20 1450 11/13/20 1516  BP: (!) 160/65 (!) 167/70  Pulse: 65 (!) 50  Resp: 17 18  Temp: 36.6 C 36.7 C  SpO2: 99% 100%    Last Pain:  Vitals:   11/13/20 1516  TempSrc: Oral  PainSc: 7                  [name]

## 2020-11-13 NOTE — Anesthesia Procedure Notes (Signed)
Anesthesia Regional Block: Popliteal block   Pre-Anesthetic Checklist: , timeout performed,  Correct Patient, Correct Site, Correct Laterality,  Correct Procedure, Correct Position, site marked,  Risks and benefits discussed,  Surgical consent,  Pre-op evaluation,  At surgeon's request and post-op pain management  Laterality: Right  Prep: Maximum Sterile Barrier Precautions used, chloraprep       Needles:  Injection technique: Single-shot  Needle Type: Echogenic Stimulator Needle     Needle Length: 9cm  Needle Gauge: 22     Additional Needles:   Procedures:,,,, ultrasound used (permanent image in chart),,    Narrative:  Start time: 11/13/2020 1:00 PM End time: 11/13/2020 1:03 PM Injection made incrementally with aspirations every 5 mL.  Performed by: Personally  Anesthesiologist: Elmer Picker, MD  Additional Notes: Monitors applied. No increased pain on injection. No increased resistance to injection. Injection made in 5cc increments. Good needle visualization. Patient tolerated procedure well.

## 2020-11-13 NOTE — Anesthesia Procedure Notes (Signed)
Anesthesia Regional Block: Adductor canal block   Pre-Anesthetic Checklist: , timeout performed,  Correct Patient, Correct Site, Correct Laterality,  Correct Procedure, Correct Position, site marked,  Risks and benefits discussed,  Surgical consent,  Pre-op evaluation,  At surgeon's request and post-op pain management  Laterality: Right  Prep: Maximum Sterile Barrier Precautions used, chloraprep       Needles:  Injection technique: Single-shot  Needle Type: Echogenic Stimulator Needle     Needle Length: 9cm  Needle Gauge: 22     Additional Needles:   Procedures:,,,, ultrasound used (permanent image in chart),,    Narrative:  Start time: 11/13/2020 1:03 PM End time: 11/13/2020 1:06 PM Injection made incrementally with aspirations every 5 mL.  Performed by: Personally  Anesthesiologist: Elmer Picker, MD  Additional Notes: Monitors applied. No increased pain on injection. No increased resistance to injection. Injection made in 5cc increments. Good needle visualization. Patient tolerated procedure well.

## 2020-11-13 NOTE — Interval H&P Note (Signed)
History and Physical Interval Note:  11/13/2020 6:52 AM  Chad Hall  has presented today for surgery, with the diagnosis of Gangrene Right Foot.  The various methods of treatment have been discussed with the patient and family. After consideration of risks, benefits and other options for treatment, the patient has consented to  Procedure(s): RIGHT BELOW KNEE AMPUTATION (Right) as a surgical intervention.  [name] history has been reviewed, patient examined, no change in status, stable for surgery.  I have reviewed the patient's chart and labs.  Questions were answered to the patient's satisfaction.     Nadara Mustard

## 2020-11-13 NOTE — Plan of Care (Signed)

## 2020-11-13 NOTE — Transfer of Care (Signed)
Immediate Anesthesia Transfer of Care Note  Patient: Chad Hall  Procedure(s) Performed: RIGHT BELOW KNEE AMPUTATION (Right: Knee)  Patient Location: PACU  Anesthesia Type:General and Regional  Level of Consciousness: awake, oriented and patient cooperative  Airway & Oxygen Therapy: Patient Spontanous Breathing and Patient connected to nasal cannula oxygen  Post-op Assessment: Report given to RN, Post -op Vital signs reviewed and stable and Patient moving all extremities  Post vital signs: Reviewed and stable  Last Vitals:  Vitals Value Taken Time  BP 156/90 11/13/20 1420  Temp 36.6 C 11/13/20 1420  Pulse 59 11/13/20 1421  Resp 20 11/13/20 1421  SpO2 100 % 11/13/20 1421  Vitals shown include unvalidated device data.  Last Pain:  Vitals:   11/13/20 1147  TempSrc:   PainSc: 0-No pain      Patients Stated Pain Goal: 0 (11/13/20 0856)  Complications: No notable events documented.

## 2020-11-13 NOTE — Progress Notes (Signed)
OT Cancellation Note  Patient Details Name: Chad Hall MRN: [id number] DOB: 06-12-1950   Cancelled Treatment:    Reason Eval/Treat Not Completed: Patient at procedure or test/ unavailable.  Will follow and see as able.   Barry Brunner, OT Acute Rehabilitation Services Pager [phone] Office [phone]   Chancy Milroy 11/13/2020, 11:42 AM

## 2020-11-13 NOTE — Anesthesia Procedure Notes (Signed)
Procedure Name: LMA Insertion Date/Time: 11/13/2020 1:43 PM Performed by: Lendon Ka, CRNA Pre-anesthesia Checklist: Patient identified, Emergency Drugs available, Suction available and Patient being monitored Patient Re-evaluated:Patient Re-evaluated prior to induction Oxygen Delivery Method: Circle system utilized Preoxygenation: Pre-oxygenation with 100% oxygen Induction Type: IV induction LMA: LMA inserted LMA Size: 5.0 Placement Confirmation: positive ETCO2 Dental Injury: Teeth and Oropharynx as per pre-operative assessment

## 2020-11-13 NOTE — Progress Notes (Signed)
Orthopedic Tech Progress Note Patient Details:  Chad Hall 1951/01/11 [id number]  Called in order to [facility]  for a VIVE PROTOCOL BK .  Patient ID: Chad Hall, male   DOB: 1951-01-17, 70 y.o.   MRN: [id number]  [name] 11/13/2020, 2:30 PM

## 2020-11-14 ENCOUNTER — Encounter (HOSPITAL_COMMUNITY): Payer: Self-pay | Admitting: Orthopedic Surgery

## 2020-11-14 DIAGNOSIS — E1149 Type 2 diabetes mellitus with other diabetic neurological complication: Secondary | ICD-10-CM

## 2020-11-14 DIAGNOSIS — M8600 Acute hematogenous osteomyelitis, unspecified site: Secondary | ICD-10-CM

## 2020-11-14 DIAGNOSIS — I96 Gangrene, not elsewhere classified: Secondary | ICD-10-CM | POA: Diagnosis not present

## 2020-11-14 LAB — COMPREHENSIVE METABOLIC PANEL
ALT: 25 U/L (ref 0–44)
AST: 33 U/L (ref 15–41)
Albumin: 1.8 g/dL — ABNORMAL LOW (ref 3.5–5.0)
Alkaline Phosphatase: 49 U/L (ref 38–126)
Anion gap: 8 (ref 5–15)
BUN: 40 mg/dL — ABNORMAL HIGH (ref 8–23)
CO2: 19 mmol/L — ABNORMAL LOW (ref 22–32)
Calcium: 8.4 mg/dL — ABNORMAL LOW (ref 8.9–10.3)
Chloride: 111 mmol/L (ref 98–111)
Creatinine, Ser: 1.43 mg/dL — ABNORMAL HIGH (ref 0.61–1.24)
GFR, Estimated: 52 mL/min — ABNORMAL LOW (ref >60)
Glucose, Bld: 171 mg/dL — ABNORMAL HIGH (ref 70–99)
Potassium: 5.5 mmol/L — ABNORMAL HIGH (ref 3.5–5.1)
Sodium: 138 mmol/L (ref 135–145)
Total Bilirubin: 0.6 mg/dL (ref 0.3–1.2)
Total Protein: 6.8 g/dL (ref 6.5–8.1)

## 2020-11-14 LAB — CULTURE, BLOOD (ROUTINE X 2): Special Requests: ADEQUATE

## 2020-11-14 LAB — CBC
HCT: 31.2 % — ABNORMAL LOW (ref 39.0–52.0)
Hemoglobin: 9.5 g/dL — ABNORMAL LOW (ref 13.0–17.0)
MCH: 27.9 pg (ref 26.0–34.0)
MCHC: 30.4 g/dL (ref 30.0–36.0)
MCV: 91.5 fL (ref 80.0–100.0)
Platelets: 446 10*3/uL — ABNORMAL HIGH (ref 150–400)
RBC: 3.41 MIL/uL — ABNORMAL LOW (ref 4.22–5.81)
RDW: 12.8 % (ref 11.5–15.5)
WBC: 11 10*3/uL — ABNORMAL HIGH (ref 4.0–10.5)
nRBC: 0 % (ref 0.0–0.2)

## 2020-11-14 LAB — BASIC METABOLIC PANEL
Anion gap: 7 (ref 5–15)
BUN: 41 mg/dL — ABNORMAL HIGH (ref 8–23)
CO2: 21 mmol/L — ABNORMAL LOW (ref 22–32)
Calcium: 8.3 mg/dL — ABNORMAL LOW (ref 8.9–10.3)
Chloride: 110 mmol/L (ref 98–111)
Creatinine, Ser: 1.41 mg/dL — ABNORMAL HIGH (ref 0.61–1.24)
GFR, Estimated: 53 mL/min — ABNORMAL LOW (ref >60)
Glucose, Bld: 162 mg/dL — ABNORMAL HIGH (ref 70–99)
Potassium: 5 mmol/L (ref 3.5–5.1)
Sodium: 138 mmol/L (ref 135–145)

## 2020-11-14 LAB — TYPE AND SCREEN
ABO/RH(D): O POS
Antibody Screen: NEGATIVE
Unit division: 0

## 2020-11-14 LAB — GLUCOSE, CAPILLARY
Glucose-Capillary: 99 mg/dL (ref 70–99)
Glucose-Capillary: 163 mg/dL — ABNORMAL HIGH (ref 70–99)
Glucose-Capillary: 186 mg/dL — ABNORMAL HIGH (ref 70–99)
Glucose-Capillary: 148 mg/dL — ABNORMAL HIGH (ref 70–99)
Glucose-Capillary: 105 mg/dL — ABNORMAL HIGH (ref 70–99)

## 2020-11-14 LAB — BPAM RBC
Blood Product Expiration Date: 202210282359
ISSUE DATE / TIME: 202209280901
Unit Type and Rh: 5100

## 2020-11-14 NOTE — Progress Notes (Signed)
Patient ID: Chad [name], male   DOB: [birth year], 70 y.o.   MRN: [id number] Patient is postoperative day 1 transtibial amputation.  There is no drainage in the wound VAC canister patient denies any pain.  Patient states that he does have assistance at home and would like to discharge to home with home health therapy.

## 2020-11-14 NOTE — Progress Notes (Signed)
Family Medicine Teaching Service Daily Progress Note Intern Pager: [phone]  Patient name: Chad [name] Medical record number: [id number] Date of birth: 1950-09-17 Age: 70 y.o. Gender: male  Primary Care Provider: Evelena Leyden, DO Consultants: Orthopedic surgery, Vascular surgery Code Status: Full  Pt Overview and Major Events to Date:  Patient admitted - 11/11/20 BKA - 11/13/20  Assessment and Plan: Chad [name] is a 70 y.o. male presenting with foot wound and concern for osteomyelitis. PMH is significant for blindness in both eyes, type 2 diabetes, hypertension, hyperlipidemia   Gangrene Right Foot  Patient had BKA yesterday, is POD 1, Hgb 9.5 after surgery, WBC 11, K 5.0  -Orthopedic surgery following, appreciate recs -Vascular surgery following, appreciate recs -Continue Abx until TEE, concern for bacteremia. Antibiotics include daptomycin (9/27-) and cefazolin (9/28-), previously Vanc (9/26-9/27), Cefepime (9/26-9/27), and Flagyl (9/26-9/27), Zosyn (9/26) -TEE Monday 0930  AKI Creatinine 1.43 (1.6,1.66) today, on admission was 2.16, with baseline 1.0-1.1.  BUN 40 today (36, 34, 39)  Fatigue  generalized weakness: -PT/OT following   DM2 Home medications include Jardiance 10 mg daily, Tradjenta 5 mg nightly.  Most recent A1c 6.9 on 11/12/2020, CBG 113-200  HTN Home medications include enalapril 20 mg, metoprolol succinate 12.5 mg daily, BPs 120-170's / 60-90's.  -Continue metoprolol -Hold enalapril in setting of AKI   Diabetic Neuropathy -Continue gabapentin 300 mg daily, renally adjusted for AKI  HFpEF Most recent echo 11/12/20 with grade 1 diastolic dysfunction, normal LV systolic function EF 60-65%  History of Blindness Patient blind since 1967 due to retinal detachment. Lives with spouse who is also blind -SW following, appreciate recs  FEN/GI: Heart Healthy / Carb modified PPx: Heparin Sub Q Dispo:Home with home health  in 2-3 days.    Subjective:   Patient reports that he feels well, is in no pain, and that he heard that his surgery went well. Still interested in going home after the hospital. Feels like he may have a BM today, but would like to use bedside commode.   Objective: Temp:  [97.6 F (36.4 C)-98.7 F (37.1 C)] 97.8 F (36.6 C) (09/29 0657) Pulse Rate:  [48-86] 53 (09/29 0657) Resp:  [10-20] 17 (09/29 0657) BP: (144-191)/(54-90) 154/59 (09/29 0657) SpO2:  [96 %-100 %] 100 % (09/29 0657) Weight:  [95.3 kg] 95.3 kg (09/28 1140) Physical Exam: General: African American male, no acute distress, sitting comfortably in bed Cardiovascular: RRR, NRMG Respiratory: CTABL, no wheezes or stridor Abdomen: Soft, NT/ND Extremities: Pulses intact in UE, left leg BKA, Right leg BKA in wound vac  Laboratory: Recent Labs  Lab 11/13/20 0329 11/13/20 1717 11/14/20 0135  WBC 9.2 9.9 11.0*  HGB 7.9* 10.1* 9.5*  HCT 26.8* 33.4* 31.2*  PLT 474* 494* 446*   Recent Labs  Lab 11/12/20 0948 11/13/20 0329 11/14/20 0135  NA 136 136 138  K 4.7 4.8 5.5*  CL 106 108 111  CO2 22 21* 19*  BUN 34* 36* 40*  CREATININE 1.66* 1.60* 1.43*  CALCIUM 8.6* 8.3* 8.4*  PROT 7.4 6.5 6.8  BILITOT 0.7 0.5 0.6  ALKPHOS 52 51 49  ALT 30 32 25  AST 33 37 33  GLUCOSE 136* 119* 171*     Imaging/Diagnostic Tests:   Bess Kinds, MD 11/14/2020, 7:02 AM PGY-1, [facility] FPTS Intern pager: [phone], text pages welcome

## 2020-11-14 NOTE — Evaluation (Signed)
Physical Therapy Evaluation Patient Details Name: Chad [name] MRN: [id number] DOB: 1950/04/04 [name] Date: 11/14/2020  History of Present Illness  70 yo male [name]/p R BKA on 9/28 for R foot gangrene with maggot infestation. PMH includes L BKA, DM2, peripheral neuropathy, HTN, Marfan'[name] syndrome, bilat blindness.  Clinical Impression   Pt presents with post-operative RLE pain, increased time and effort to mobilize vs baseline, and decreased activity tolerance. Pt to benefit from acute PT to address deficits. Pt requiring close guard and mod cuing for scoot pivot OOB to recliner today, required prosthesis donned LLE to complete transfer. Pt adamant about d/c home, recommending HHPT. PT to progress mobility as tolerated, and will continue to follow acutely.         Recommendations for follow up therapy are one component of a multi-disciplinary discharge planning process, led by the attending physician.  Recommendations may be updated based on patient status, additional functional criteria and insurance authorization.  Follow Up Recommendations Home health PT    Equipment Recommendations  None recommended by PT    Recommendations for Other Services       Precautions / Restrictions Precautions Precautions: Fall Precaution Comments: R BKA limb protector Restrictions Weight Bearing Restrictions: Yes Other Position/Activity Restrictions: NWB on operative extremity      Mobility  Bed Mobility               General bed mobility comments: Sitting at EOB upon arrival    Transfers Overall transfer level: Needs assistance Equipment used: None Transfers: Lateral/Scoot Transfers          Lateral/Scoot Transfers: From elevated surface;Min guard;+2 safety/equipment General transfer comment: Pt required min guard for lateral scoot to chair with LLE prosthetic donned, cues for head-hips relationship to aide in scooting to R and hand placement.  Ambulation/Gait              General Gait Details: nt  Information systems manager Rankin (Stroke Patients Only)       Balance Overall balance assessment: Needs assistance Sitting-balance support: Feet supported;No upper extremity supported Sitting balance-Leahy Scale: Good Sitting balance - Comments: static sitting balance good no LOBs, dynamic balance good while donning R residual limb prosthetic.                                     Pertinent Vitals/Pain Pain Assessment: Faces Faces Pain Scale: Hurts little more Pain Location: RLE Pain Descriptors / Indicators: Discomfort;Grimacing Pain Intervention([name]): Limited activity within patient'[name] tolerance;Monitored during session;Repositioned    Home Living Family/patient expects to be discharged to:: Private residence Living Arrangements: Spouse/significant other (wife, also visually impaired) Available Help at Discharge: Family Type of Home: House Home Access: Ramped entrance     Home Layout: One level Home Equipment: Shower seat;Hand held shower head;Wheelchair - manual;Bedside commode Additional Comments: Pt lives with.... has a Administrator, Civil Service ([employer])    Prior Function Level of Independence: Independent         Comments: Household ambulation with his prosthetic limb (L). Ambulates outside with service dog and seeing eye cane. Mod indep with ADLs/iADLs, stands for showers. Pt states his wife can bring him food/drinks, assist for ADL tasks if needed, but cannot physically assist pt     Hand Dominance   Dominant Hand: Right    Extremity/Trunk Assessment   Upper Extremity Assessment  Upper Extremity Assessment: Overall WFL for tasks assessed    Lower Extremity Assessment Lower Extremity Assessment: RLE deficits/detail;LLE deficits/detail;Defer to PT evaluation RLE Deficits / Details: [name]/p R BKA LLE Deficits / Details: Prior L BKA    Cervical / Trunk Assessment Cervical / Trunk Assessment: Normal   Communication   Communication: No difficulties  Cognition Arousal/Alertness: Awake/alert Behavior During Therapy: WFL for tasks assessed/performed Overall Cognitive Status: Within Functional Limits for tasks assessed                                 General Comments: Very motivated      General Comments General comments (skin integrity, edema, etc.): Wound vac in place    Exercises     Assessment/Plan    PT Assessment Patient needs continued PT services  PT Problem List Decreased strength;Decreased mobility;Decreased activity tolerance;Decreased balance;Decreased knowledge of use of DME;Pain;Decreased coordination       PT Treatment Interventions DME instruction;Therapeutic activities;Therapeutic exercise;Patient/family education;Balance training;Functional mobility training;Neuromuscular re-education    PT Goals (Current goals can be found in the Care Plan section)  Acute Rehab PT Goals Patient Stated Goal: Go home PT Goal Formulation: With patient Time For Goal Achievement: 11/28/20 Potential to Achieve Goals: Good    Frequency Min 4X/week   Barriers to discharge        Co-evaluation   Reason for Co-Treatment: For patient/therapist safety;To address functional/ADL transfers PT goals addressed during session: Mobility/safety with mobility;Balance OT goals addressed during session: ADL'[name] and self-care       AM-PAC PT "6 Clicks" Mobility  Outcome Measure Help needed turning from your back to your side while in a flat bed without using bedrails?: A Little Help needed moving from lying on your back to sitting on the side of a flat bed without using bedrails?: A Little Help needed moving to and from a bed to a chair (including a wheelchair)?: A Little Help needed standing up from a chair using your arms (e.g., wheelchair or bedside chair)?: A Little Help needed to walk in hospital room?: A Lot Help needed climbing 3-5 steps with a railing? : Total 6  Click Score: 15    End of Session   Activity Tolerance: Patient tolerated treatment well Patient left: in chair;with call bell/phone within reach;with chair alarm set Nurse Communication: Mobility status PT Visit Diagnosis: Other abnormalities of gait and mobility (R26.89);Pain Pain - Right/Left: Right Pain - part of body: Leg    Time: 1130-1202 PT Time Calculation (min) (ACUTE ONLY): 32 min   Charges:   PT Evaluation $PT Eval Low Complexity: 1 Low          Chad [name], PT DPT Acute Rehabilitation Services Pager [phone]  Office [phone]   Chad [name] [place] 11/14/2020, 1:41 PM

## 2020-11-14 NOTE — Progress Notes (Signed)
FPTS Brief Progress Note  S: Sleeping.   O: BP (!) 177/65 (BP Location: Left Arm)   Pulse (!) 48   Temp 97.7 F (36.5 C) (Oral)   Resp 16   Ht 6\' 5"  (1.956 m)   Wt 95.3 kg   SpO2 97%   BMI 24.90 kg/m   General: Lying in bed snoring, no acute distress. Age appropriate. Respiratory: normal effort   A/P: Gangrene Right Foot s/p BKA POD #1 - Orders reviewed. Labs for AM ordered, which was adjusted as needed.   , DO 11/14/2020, 1:54 AM PGY-3, [facility] Night Resident  Please page [phone] with questions.

## 2020-11-14 NOTE — Evaluation (Signed)
Occupational Therapy Evaluation Patient Details Name: Chad [name] MRN: [id number] DOB: 23-Aug-1950 Today's Date: 11/14/2020   History of Present Illness 70 yo male s/p R BKA on 9/28 for R foot gangrene with maggot infestation. PMH includes L BKA, DM2, peripheral neuropathy, HTN, Marfan's syndrome, bilat blindness.   Clinical Impression   PTA, pt was living with his wife and was independent with ADLs and functional mobility using his L prosthetic. Pt currently requiring Min Guard A for LB ADLs and lateral scoot transfers. Pt highly motivated to return to home and PLOF. Pt would benefit from further acute OT to facilitate safe dc. Recommend dc to home with HHOT for further OT to optimize safety, independence with ADLs, and return to PLOF.      Recommendations for follow up therapy are one component of a multi-disciplinary discharge planning process, led by the attending physician.  Recommendations may be updated based on patient status, additional functional criteria and insurance authorization.   Follow Up Recommendations  Home health OT    Equipment Recommendations  None recommended by OT    Recommendations for Other Services       Precautions / Restrictions Precautions Precautions: Fall Restrictions Weight Bearing Restrictions: Yes Other Position/Activity Restrictions: NWB on operative extremity      Mobility Bed Mobility               General bed mobility comments: Sitting at EOB upon arrival    Transfers Overall transfer level: Needs assistance Equipment used: None Transfers: Lateral/Scoot Transfers          Lateral/Scoot Transfers: From elevated surface;Min guard;+2 safety/equipment General transfer comment: Pt required min guard for lateral scoot to chair with LLE prosthetic donned.    Balance Overall balance assessment: Needs assistance Sitting-balance support: Feet supported;No upper extremity supported Sitting balance-Leahy Scale: Good Sitting  balance - Comments: static sitting balance good no LOBs, dynamic balance good while donning R residual limb prosthetic.                                   ADL either performed or assessed with clinical judgement   ADL Overall ADL's : Needs assistance/impaired Eating/Feeding: Set up;Sitting   Grooming: Set up;Sitting   Upper Body Bathing: Supervision/ safety;Set up;Sitting   Lower Body Bathing: Min guard;Sitting/lateral leans   Upper Body Dressing : Set up;Supervision/safety;Sitting   Lower Body Dressing: Min guard;Sitting/lateral leans Lower Body Dressing Details (indicate cue type and reason): Close Min guard A while pt donned his prothetic limb Toilet Transfer: Min guard;+2 for safety/equipment;Transfer board (lateral scoot to drop arm recliner)           Functional mobility during ADLs: Min guard (lateral scoot to R) General ADL Comments: Pt presenting with decreased balance and strength. However, very motivated to participate in therapy and feel he will progress well with time     Vision Baseline Vision/History: 2 Legally blind Ability to See in Adequate Light: 4 Severely impaired Patient Visual Report: No change from baseline       Perception     Praxis      Pertinent Vitals/Pain Pain Assessment: Faces Faces Pain Scale: Hurts little more Pain Location: RLE Pain Descriptors / Indicators: Discomfort;Grimacing Pain Intervention(s): Monitored during session;Limited activity within patient's tolerance;Repositioned     Hand Dominance Right   Extremity/Trunk Assessment Upper Extremity Assessment Upper Extremity Assessment: Overall WFL for tasks assessed   Lower Extremity Assessment Lower Extremity  Assessment: RLE deficits/detail;LLE deficits/detail;Defer to PT evaluation RLE Deficits / Details: s/p R BKA LLE Deficits / Details: Prior L BKA   Cervical / Trunk Assessment Cervical / Trunk Assessment: Normal   Communication  Communication Communication: No difficulties   Cognition Arousal/Alertness: Awake/alert Behavior During Therapy: WFL for tasks assessed/performed Overall Cognitive Status: Within Functional Limits for tasks assessed                                 General Comments: Very motivated   General Comments  Wound vac in place    Exercises     Shoulder Instructions      Home Living Family/patient expects to be discharged to:: Private residence Living Arrangements: Spouse/significant other Available Help at Discharge: Family Type of Home: House Home Access: Ramped entrance     Home Layout: One level     Bathroom Shower/Tub: Producer, television/film/video: Standard     Home Equipment: Shower seat;Hand held shower head;Wheelchair - manual;Bedside commode   Additional Comments: Pt lives with.... has a Administrator, [employer] ([employer])      Prior Functioning/Environment Level of Independence: Independent        Comments: Household ambulation with his prosthetic limb (L). Ambulates outside with service dog and seeing eye cane. Mod indep with ADLs/iADLs, stands for showers        OT Problem List: Decreased strength;Decreased range of motion;Decreased activity tolerance;Impaired vision/perception;Impaired balance (sitting and/or standing);Decreased knowledge of precautions;Decreased knowledge of use of DME or AE;Pain      OT Treatment/Interventions: Self-care/ADL training;Therapeutic exercise;Energy conservation;DME and/or AE instruction;Therapeutic activities;Patient/family education    OT Goals(Current goals can be found in the care plan section) Acute Rehab OT Goals Patient Stated Goal: Go home OT Goal Formulation: With patient Time For Goal Achievement: 11/28/20 Potential to Achieve Goals: Good  OT Frequency: Min 2X/week   Barriers to D/C:            Co-evaluation PT/OT/SLP Co-Evaluation/Treatment: Yes Reason for Co-Treatment: To address functional/ADL  transfers;For patient/therapist safety   OT goals addressed during session: ADL's and self-care      AM-PAC OT "6 Clicks" Daily Activity     Outcome Measure Help from another person eating meals?: None Help from another person taking care of personal grooming?: A Little Help from another person toileting, which includes using toliet, bedpan, or urinal?: A Little Help from another person bathing (including washing, rinsing, drying)?: A Little Help from another person to put on and taking off regular upper body clothing?: A Little Help from another person to put on and taking off regular lower body clothing?: A Little 6 Click Score: 19   End of Session Equipment Utilized During Treatment: Other (comment) (prosthetic LLE) Nurse Communication: Mobility status  Activity Tolerance: Patient tolerated treatment well Patient left: in chair;with call bell/phone within reach;with chair alarm set  OT Visit Diagnosis: Other abnormalities of gait and mobility (R26.89);Unsteadiness on feet (R26.81);Muscle weakness (generalized) (M62.81);Pain Pain - Right/Left: Right Pain - part of body: Leg                Time: 2536-6440 OT Time Calculation (min): 27 min Charges:  OT General Charges $OT Visit: 1 Visit OT Evaluation $OT Eval Moderate Complexity: 1 Mod  Anasha Perfecto MSOT, OTR/L Acute Rehab Pager: [phone] Office: [phone]  Theodoro Grist Arhianna Ebey 11/14/2020, 12:26 PM

## 2020-11-14 NOTE — Progress Notes (Addendum)
Patient was seen, examined,treatment plan was discussed with the  Advance Practice Provider.  I have personally reviewed the clinical findings, labs, imaging studies and management of this patient in detail.  I agree with the documentation, as recorded by the Advance Practice Provider.    He is now s/p BKA and repeat culture with no growth from 9/27.  TEE requested and will continue with daptomycin.   Will continue to follow     [facility]  Date of Admission:  11/11/2020     Total days of antibiotics 4         ASSESSMENT:  Chad [name] is POD #1 from right below knee amputation and clinically appears to be doing well. Blood cultures from 11/12/20 without growth to date. TTE without evidence of vegetation and TEE has been scheduled for 10/3 with final plan pending results. CK levels stable. Discussed plan of care. Continue current dose of Daptomycin. Wound care per Orthopedics and remaining care per primary team.   PLAN:  Continue current dose of Daptomycin.  Therapeutic monitoring of CK levels while on Daptomycin. Monitor cultures for clearance of bacteremia.  Wound care per orthopedics.  Remaining care per primary team.   Active Problems:   DM (diabetes mellitus), type 2 with neurological complications (HCC)   Venous insufficiency of leg   Maggot infestation   Polymicrobial bacterial infection   Gangrene of right foot (HCC)   PVD (peripheral vascular disease) (HCC)    vitamin C  1,000 mg Oral Daily   atorvastatin  80 mg Oral Daily   docusate sodium  100 mg Oral Daily   gabapentin  300 mg Oral Daily   [START ON 11/15/2020] heparin  5,000 Units Subcutaneous Q8H   insulin aspart  0-9 Units Subcutaneous TID WC   linagliptin  5 mg Oral QHS   metoprolol succinate  12.5 mg Oral QPM   mupirocin ointment  1 application Nasal BID   nutrition supplement (JUVEN)  1 packet Oral BID BM   pantoprazole  40 mg Oral Daily   zinc sulfate  220 mg Oral Daily     SUBJECTIVE:  Afebrile overnight with no acute events. Feeling okay today. No new concerns/complaints.   No Known Allergies   Review of Systems: Review of Systems  Constitutional:  Negative for chills, fever and weight loss.  Respiratory:  Negative for cough, shortness of breath and wheezing.   Cardiovascular:  Negative for chest pain and leg swelling.  Gastrointestinal:  Negative for abdominal pain, constipation, diarrhea, nausea and vomiting.  Skin:  Negative for rash.     OBJECTIVE: Vitals:   11/13/20 1643 11/13/20 2118 11/14/20 0657 11/14/20 0838  BP: (!) 177/65 123/68 (!) 154/59 122/72  Pulse: (!) 48 66 (!) 53 78  Resp: 16 18 17 16   Temp: 97.7 F (36.5 C) 98.5 F (36.9 C) 97.8 F (36.6 C) 98 F (36.7 C)  TempSrc: Oral Oral Oral Oral  SpO2: 97% 100% 100% 100%  Weight:      Height:       Body mass index is 24.9 kg/m.  Physical Exam Constitutional:      General: He is not in acute distress.    Appearance: He is well-developed.  Cardiovascular:     Rate and Rhythm: Normal rate and regular rhythm.     Heart sounds: Normal heart sounds.  Pulmonary:     Effort: Pulmonary effort is normal.     Breath sounds: Normal breath sounds.  Musculoskeletal:  Comments: No drainage in VAC container.   Skin:    General: Skin is warm and dry.  Neurological:     Mental Status: He is alert and oriented to person, place, and time.  Psychiatric:        Mood and Affect: Mood normal.    Lab Results Lab Results  Component Value Date   WBC 11.0 (H) 11/14/2020   HGB 9.5 (L) 11/14/2020   HCT 31.2 (L) 11/14/2020   MCV 91.5 11/14/2020   PLT 446 (H) 11/14/2020    Lab Results  Component Value Date   CREATININE 1.41 (H) 11/14/2020   BUN 41 (H) 11/14/2020   NA 138 11/14/2020   K 5.0 11/14/2020   CL 110 11/14/2020   CO2 21 (L) 11/14/2020    Lab Results  Component Value Date   ALT 25 11/14/2020   AST 33 11/14/2020   ALKPHOS 49 11/14/2020   BILITOT 0.6 11/14/2020      Microbiology: Recent Results (from the past 240 hour(s))  Blood culture (routine x 2)     Status: Abnormal   Collection Time: 11/11/20  8:53 AM   Specimen: BLOOD  Result Value Ref Range Status   Specimen Description BLOOD LEFT ANTECUBITAL  Final   Special Requests   Final    BOTTLES DRAWN AEROBIC AND ANAEROBIC Blood Culture adequate volume   Culture  Setup Time   Final    GRAM POSITIVE COCCI ANAEROBIC BOTTLE ONLY CRITICAL RESULT CALLED TO, READ BACK BY AND VERIFIED WITH: PHARMD JAMES LEDFORD 11/12/20@1 :29 BY TW IN BOTH AEROBIC AND ANAEROBIC BOTTLES    Culture (A)  Final    GROUP B STREP(S.AGALACTIAE)ISOLATED METHICILLIN RESISTANT STAPHYLOCOCCUS AUREUS STAPHYLOCOCCUS HAEMOLYTICUS STAPHYLOCOCCUS EPIDERMIDIS THE SIGNIFICANCE OF ISOLATING THIS ORGANISM FROM A SINGLE SET OF BLOOD CULTURES WHEN MULTIPLE SETS ARE DRAWN IS UNCERTAIN. PLEASE NOTIFY THE MICROBIOLOGY DEPARTMENT WITHIN ONE WEEK IF SPECIATION AND SENSITIVITIES ARE REQUIRED. Performed at [facility], [address] [place], [place] [address]    Report Status 11/14/2020 FINAL  Final   Organism ID, Bacteria GROUP B STREP(S.AGALACTIAE)ISOLATED  Final   Organism ID, Bacteria METHICILLIN RESISTANT STAPHYLOCOCCUS AUREUS  Final      Susceptibility   Group b strep(s.agalactiae)isolated - MIC*    CLINDAMYCIN <=0.25 SENSITIVE Sensitive     AMPICILLIN <=0.25 SENSITIVE Sensitive     ERYTHROMYCIN 2 RESISTANT Resistant     VANCOMYCIN 0.5 SENSITIVE Sensitive     CEFTRIAXONE <=0.12 SENSITIVE Sensitive     LEVOFLOXACIN 1 SENSITIVE Sensitive     * GROUP B STREP(S.AGALACTIAE)ISOLATED   Methicillin resistant staphylococcus aureus - MIC*    CIPROFLOXACIN >=8 RESISTANT Resistant     ERYTHROMYCIN >=8 RESISTANT Resistant     GENTAMICIN <=0.5 SENSITIVE Sensitive     OXACILLIN >=4 RESISTANT Resistant     TETRACYCLINE <=1 SENSITIVE Sensitive     VANCOMYCIN 1 SENSITIVE Sensitive     TRIMETH/SULFA >=320 RESISTANT Resistant      CLINDAMYCIN <=0.25 SENSITIVE Sensitive     RIFAMPIN <=0.5 SENSITIVE Sensitive     Inducible Clindamycin NEGATIVE Sensitive     * METHICILLIN RESISTANT STAPHYLOCOCCUS AUREUS  Blood Culture ID Panel (Reflexed)     Status: Abnormal   Collection Time: 11/11/20  8:53 AM  Result Value Ref Range Status   Enterococcus faecalis NOT DETECTED NOT DETECTED Final   Enterococcus Faecium NOT DETECTED NOT DETECTED Final   Listeria monocytogenes NOT DETECTED NOT DETECTED Final   Staphylococcus species DETECTED (A) NOT DETECTED Final  Comment: CRITICAL RESULT CALLED TO, READ BACK BY AND VERIFIED WITH: PHARMD JAMES LEDFORD 11/12/20@1 :29 BY TW    Staphylococcus aureus (BCID) DETECTED (A) NOT DETECTED Final    Comment: Methicillin (oxacillin)-resistant Staphylococcus aureus (MRSA). MRSA is predictably resistant to beta-lactam antibiotics (except ceftaroline). Preferred therapy is vancomycin unless clinically contraindicated. Patient requires contact precautions if  hospitalized. CRITICAL RESULT CALLED TO, READ BACK BY AND VERIFIED WITH: PHARMD JAMES LEDFORD 11/12/20@1 :29 BY TW    Staphylococcus epidermidis DETECTED (A) NOT DETECTED Final    Comment: CRITICAL RESULT CALLED TO, READ BACK BY AND VERIFIED WITH: PHARMD JAMES LEDFORD 11/12/20@1 :29 BY TW    Staphylococcus lugdunensis NOT DETECTED NOT DETECTED Final   Streptococcus species DETECTED (A) NOT DETECTED Final    Comment: CRITICAL RESULT CALLED TO, READ BACK BY AND VERIFIED WITH: PHARMD JAMES LEDFORD 11/12/20@1 :29 BY TW    Streptococcus agalactiae DETECTED (A) NOT DETECTED Final    Comment: CRITICAL RESULT CALLED TO, READ BACK BY AND VERIFIED WITH: PHARMD JAMES LEDFORD 11/12/20@1 :29 BY TW    Streptococcus pneumoniae NOT DETECTED NOT DETECTED Final   Streptococcus pyogenes NOT DETECTED NOT DETECTED Final   A.calcoaceticus-baumannii NOT DETECTED NOT DETECTED Final   Bacteroides fragilis NOT DETECTED NOT DETECTED Final   Enterobacterales NOT DETECTED  NOT DETECTED Final   Enterobacter cloacae complex NOT DETECTED NOT DETECTED Final   Escherichia coli NOT DETECTED NOT DETECTED Final   Klebsiella aerogenes NOT DETECTED NOT DETECTED Final   Klebsiella oxytoca NOT DETECTED NOT DETECTED Final   Klebsiella pneumoniae NOT DETECTED NOT DETECTED Final   Proteus species NOT DETECTED NOT DETECTED Final   Salmonella species NOT DETECTED NOT DETECTED Final   Serratia marcescens NOT DETECTED NOT DETECTED Final   Haemophilus influenzae NOT DETECTED NOT DETECTED Final   Neisseria meningitidis NOT DETECTED NOT DETECTED Final   Pseudomonas aeruginosa NOT DETECTED NOT DETECTED Final   Stenotrophomonas maltophilia NOT DETECTED NOT DETECTED Final   Candida albicans NOT DETECTED NOT DETECTED Final   Candida auris NOT DETECTED NOT DETECTED Final   Candida glabrata NOT DETECTED NOT DETECTED Final   Candida krusei NOT DETECTED NOT DETECTED Final   Candida parapsilosis NOT DETECTED NOT DETECTED Final   Candida tropicalis NOT DETECTED NOT DETECTED Final   Cryptococcus neoformans/gattii NOT DETECTED NOT DETECTED Final   Methicillin resistance mecA/C DETECTED (A) NOT DETECTED Final    Comment: CRITICAL RESULT CALLED TO, READ BACK BY AND VERIFIED WITH: PHAMRD JAMES LEDFORD 11/12/20@1 :29 BY TW    Meth resistant mecA/C and MREJ DETECTED (A) NOT DETECTED Final    Comment: CRITICAL RESULT CALLED TO, READ BACK BY AND VERIFIED WITH: PHARMD JAMES LEDFORD 11/12/20@1 :29 BY TW Performed at [facility], [address], [place], [place] [address]   Blood culture (routine x 2)     Status: None (Preliminary result)   Collection Time: 11/11/20 10:18 AM   Specimen: BLOOD  Result Value Ref Range Status   Specimen Description BLOOD RIGHT ANTECUBITAL  Final   Special Requests   Final    BOTTLES DRAWN AEROBIC AND ANAEROBIC Blood Culture adequate volume   Culture   Final    NO GROWTH 1 DAY Performed at [facility], [address] [place], [place] [address]     Report Status PENDING  Incomplete  SARS CORONAVIRUS 2 (TAT 6-24 HRS) Nasopharyngeal Nasopharyngeal Swab     Status: None   Collection Time: 11/11/20 12:55 PM   Specimen: Nasopharyngeal Swab  Result Value Ref Range Status   SARS  Coronavirus 2 NEGATIVE NEGATIVE Final    Comment: (NOTE) SARS-CoV-2 target nucleic acids are NOT DETECTED.  The SARS-CoV-2 RNA is generally detectable in upper and lower respiratory specimens during the acute phase of infection. Negative results do not preclude SARS-CoV-2 infection, do not rule out co-infections with other pathogens, and should not be used as the sole basis for treatment or other patient management decisions. Negative results must be combined with clinical observations, patient history, and epidemiological information. The expected result is Negative.  Fact Sheet for Patients: HairSlick.no  Fact Sheet for Healthcare Providers: quierodirigir.com  This test is not yet approved or cleared by the [place] FDA and  has been authorized for detection and/or diagnosis of SARS-CoV-2 by FDA under an Emergency Use Authorization (EUA). This EUA will remain  in effect (meaning this test can be used) for the duration of the COVID-19 declaration under Se ction 564(b)(1) of the Act, 21 U.S.C. section 360bbb-3(b)(1), unless the authorization is terminated or revoked sooner.  Performed at [facility], [address], [place], [place] [address]   Culture, blood (routine x 2)     Status: None (Preliminary result)   Collection Time: 11/12/20 10:20 AM   Specimen: BLOOD LEFT HAND  Result Value Ref Range Status   Specimen Description BLOOD LEFT HAND  Final   Special Requests   Final    BOTTLES DRAWN AEROBIC AND ANAEROBIC Blood Culture adequate volume   Culture   Final    NO GROWTH 1 DAY Performed at [facility], [address] [place], [place] [address]    Report Status PENDING   Incomplete  Culture, blood (routine x 2)     Status: None (Preliminary result)   Collection Time: 11/12/20  3:55 PM   Specimen: BLOOD  Result Value Ref Range Status   Specimen Description BLOOD RIGHT ANTECUBITAL  Final   Special Requests   Final    BOTTLES DRAWN AEROBIC AND ANAEROBIC Blood Culture results may not be optimal due to an excessive volume of blood received in culture bottles   Culture   Final    NO GROWTH < 24 HOURS Performed at [facility], [address] [place], [place] [address]    Report Status PENDING  Incomplete  Surgical PCR screen     Status: Abnormal   Collection Time: 11/13/20 11:06 AM   Specimen: Nasal Mucosa; Nasal Swab  Result Value Ref Range Status   MRSA, PCR POSITIVE (A) NEGATIVE Final    Comment: RESULT CALLED TO, READ BACK BY AND VERIFIED WITH: RN K.HARVELL AT 1314 ON 11/13/2020 BY T.SAAD.    Staphylococcus aureus POSITIVE (A) NEGATIVE Final    Comment: (NOTE) The Xpert SA Assay (FDA approved for NASAL specimens in patients 62 years of age and older), is one component of a comprehensive surveillance program. It is not intended to diagnose infection nor to guide or monitor treatment. Performed at [facility], [address] [place], [place] [address]      Marcos Eke, NP [facility]  11/14/2020  11:17 AM

## 2020-11-14 NOTE — Progress Notes (Signed)
Inpatient Rehab Admissions Coordinator:   Note pt preferring home health at d/c.  I will sign off for CIR.   Estill Dooms, PT, DPT Admissions Coordinator [phone] 11/14/20  4:29 PM

## 2020-11-14 NOTE — Plan of Care (Signed)

## 2020-11-15 DIAGNOSIS — I96 Gangrene, not elsewhere classified: Secondary | ICD-10-CM | POA: Diagnosis not present

## 2020-11-15 LAB — CBC
HCT: 29.3 % — ABNORMAL LOW (ref 39.0–52.0)
Hemoglobin: 8.9 g/dL — ABNORMAL LOW (ref 13.0–17.0)
MCH: 28 pg (ref 26.0–34.0)
MCHC: 30.4 g/dL (ref 30.0–36.0)
MCV: 92.1 fL (ref 80.0–100.0)
Platelets: 469 10*3/uL — ABNORMAL HIGH (ref 150–400)
RBC: 3.18 MIL/uL — ABNORMAL LOW (ref 4.22–5.81)
RDW: 12.9 % (ref 11.5–15.5)
WBC: 9.2 10*3/uL (ref 4.0–10.5)
nRBC: 0 % (ref 0.0–0.2)

## 2020-11-15 LAB — BASIC METABOLIC PANEL
Anion gap: 6 (ref 5–15)
BUN: 41 mg/dL — ABNORMAL HIGH (ref 8–23)
CO2: 21 mmol/L — ABNORMAL LOW (ref 22–32)
Calcium: 8.1 mg/dL — ABNORMAL LOW (ref 8.9–10.3)
Chloride: 114 mmol/L — ABNORMAL HIGH (ref 98–111)
Creatinine, Ser: 1.47 mg/dL — ABNORMAL HIGH (ref 0.61–1.24)
GFR, Estimated: 50 mL/min — ABNORMAL LOW (ref >60)
Glucose, Bld: 156 mg/dL — ABNORMAL HIGH (ref 70–99)
Potassium: 4.5 mmol/L (ref 3.5–5.1)
Sodium: 141 mmol/L (ref 135–145)

## 2020-11-15 LAB — GLUCOSE, CAPILLARY
Glucose-Capillary: 109 mg/dL — ABNORMAL HIGH (ref 70–99)
Glucose-Capillary: 116 mg/dL — ABNORMAL HIGH (ref 70–99)
Glucose-Capillary: 189 mg/dL — ABNORMAL HIGH (ref 70–99)
Glucose-Capillary: 120 mg/dL — ABNORMAL HIGH (ref 70–99)
Glucose-Capillary: 157 mg/dL — ABNORMAL HIGH (ref 70–99)

## 2020-11-15 MED ORDER — ENSURE MAX PROTEIN PO LIQD
11.0000 [oz_av] | Freq: Every day | ORAL | Status: DC
  Administered 2020-11-15 – 2020-11-17 (×3): 11 [oz_av] via ORAL

## 2020-11-15 MED ORDER — ADULT MULTIVITAMIN W/MINERALS CH
1.0000 | ORAL_TABLET | Freq: Every day | ORAL | Status: DC
  Administered 2020-11-16 – 2020-11-18 (×3): 1 via ORAL
  Filled 2020-11-15 (×3): qty 1

## 2020-11-15 MED ORDER — SODIUM CHLORIDE 0.9 % IV BOLUS
500.0000 mL | Freq: Once | INTRAVENOUS | Status: AC
  Administered 2020-11-15: 500 mL via INTRAVENOUS

## 2020-11-15 NOTE — Progress Notes (Signed)
Physical Therapy Treatment Patient Details Name: Chad Hall MRN: [id number] DOB: 15-Jun-1950 Today's Date: 11/15/2020   History of Present Illness 70 yo male s/p R BKA on 9/28 for R foot gangrene with maggot infestation. PMH includes L BKA, DM2, peripheral neuropathy, HTN, Marfan's syndrome, bilat blindness.    PT Comments    Pt motivated to participate in therapy. Pt practiced x2 lateral transfers to drop arm recliner, requiring close guard and cues for safety but no physical assist. Pt instructed pt in early BKA exercises, pt tolerated well. Will continue to follow.     Recommendations for follow up therapy are one component of a multi-disciplinary discharge planning process, led by the attending physician.  Recommendations may be updated based on patient status, additional functional criteria and insurance authorization.  Follow Up Recommendations  Home health PT     Equipment Recommendations  None recommended by PT    Recommendations for Other Services       Precautions / Restrictions Precautions Precautions: Fall Precaution Comments: R BKA limb protector, L BKA with prosthesis Restrictions Other Position/Activity Restrictions: NWB on operative extremity     Mobility  Bed Mobility Overal bed mobility: Needs Assistance Bed Mobility: Supine to Sit     Supine to sit: Min guard;HOB elevated     General bed mobility comments: close guard for safety, increased time and effort to perform especially scooting to EOB.    Transfers Overall transfer level: Needs assistance Equipment used: None Transfers: Lateral/Scoot Transfers          Lateral/Scoot Transfers: Min guard General transfer comment: close guard for safety for pivot into and out of drop arm recliner towards R. Pt with good recall of head-hips relationship, but requires cues for scooting posteriorly as well as laterally to prevent forward sliding off bed/chair. Increased time, good power up through UEs  to translate hips.  Ambulation/Gait                 Stairs             Wheelchair Mobility    Modified Rankin (Stroke Patients Only)       Balance Overall balance assessment: Needs assistance Sitting-balance support: Feet supported;No upper extremity supported Sitting balance-Leahy Scale: Good Sitting balance - Comments: able to sit EOB and scoot laterally without LOB       Standing balance comment: nt                            Cognition Arousal/Alertness: Awake/alert Behavior During Therapy: WFL for tasks assessed/performed Overall Cognitive Status: Within Functional Limits for tasks assessed                                 General Comments: Very motivated      Exercises Amputee Exercises Quad Sets: AROM;Right;10 reps;Seated Hip ABduction/ADduction: AROM;Left;10 reps;Seated    General Comments        Pertinent Vitals/Pain Pain Assessment: Faces Faces Pain Scale: Hurts a little bit Pain Location: RLE, along incision Pain Descriptors / Indicators: Discomfort;Sore Pain Intervention(s): Monitored during session    Home Living                      Prior Function            PT Goals (current goals can now be found in the care plan section) Acute Rehab PT  Goals Patient Stated Goal: Go home PT Goal Formulation: With patient Time For Goal Achievement: 11/28/20 Potential to Achieve Goals: Good Progress towards PT goals: Progressing toward goals    Frequency    Min 4X/week      PT Plan Current plan remains appropriate    Co-evaluation              AM-PAC PT "6 Clicks" Mobility   Outcome Measure  Help needed turning from your back to your side while in a flat bed without using bedrails?: A Little Help needed moving from lying on your back to sitting on the side of a flat bed without using bedrails?: A Little Help needed moving to and from a bed to a chair (including a wheelchair)?: A  Little Help needed standing up from a chair using your arms (e.g., wheelchair or bedside chair)?: A Little Help needed to walk in hospital room?: A Lot Help needed climbing 3-5 steps with a railing? : Total 6 Click Score: 15    End of Session Equipment Utilized During Treatment: Other (comment) (R limb protector, L prosthesis) Activity Tolerance: Patient tolerated treatment well Patient left: with call bell/phone within reach;in bed;with bed alarm set Nurse Communication: Mobility status PT Visit Diagnosis: Other abnormalities of gait and mobility (R26.89);Pain Pain - Right/Left: Right Pain - part of body: Leg     Time: 1036-1100 PT Time Calculation (min) (ACUTE ONLY): 24 min  Charges:  $Therapeutic Activity: 23-37 mins                    Marye Round, PT DPT Acute Rehabilitation Services Pager [phone]  Office [phone]  Emmerie Battaglia E Christain [place] 11/15/2020, 11:27 AM

## 2020-11-15 NOTE — Progress Notes (Signed)
    [facility] has been requested to perform a transesophageal echocardiogram on Chad Hall for SBE.  After careful review of history and examination, the risks and benefits of transesophageal echocardiogram have been explained including risks of esophageal damage, perforation (1:10,000 risk), bleeding, pharyngeal hematoma as well as other potential complications associated with conscious sedation including aspiration, arrhythmia, respiratory failure and death. Alternatives to treatment were discussed, questions were answered. Patient is willing to proceed. He is scheduled for 10/3 @ 10:30 with Dr. Izora Ribas.    Nicolasa Ducking, NP  11/15/2020 7:28 PM

## 2020-11-15 NOTE — Hospital Course (Addendum)
Chad Hall is a 70 y.o. male presenting with foot wound and concern for osteomyelitis. PMH is significant for blindness in both eyes, type 2 diabetes, hypertension, hyperlipidemia. His hospital course is detailed below.   Gangrene Right Foot  Patient presented with right foot wound with foot pain that had been going on for 3 months and only recently became more more painful.  Patient previously had a left BKA on 12/28/2018 for osteomyelitis. In the emergency department patient was afebrile with mildly elevated white blood cell count of 12.  Initial lactic acid of 1.3.  CRP elevated at 21.2. Patient with significant AKI with creatinine of 2.16, baseline 1.30-1.45. Of note, maggots were found in wound. Blood cultures were collected and later grew Strep agalactiae, MRSA Staph Haemolyticus, and Staph Epidermidis. Patient was initially started on Vancomycin, Zosyn, Flagyl, and Cefepime and antibiotics were narrowed to Daptomycin and Ancef.   and orthopedics was consulted. X-ray showed findings concerning for osteomyelitis at the base of the fifth metatarsal.  . MRI of the foot showed dorsal midfoot ulceration, with no evidence of osteomyelitis or abscess. Vascular surgery was consulted and determined that there was too much tissue loss for limb salvage. Orthopedic surgery performed a BKA on 11/13/20. Patient antiboitcs were narrowed to Daptomycin. Repeat blood cultures on 9/28 NGTD. Due to concerns for bacteriemia and possible endocarditis a TEE was performed which was normal.    AKI Initial creatinine of 2.16, recent creatinine of 1.0-1.1.  Likely secondary to prerenal with a BUN of 39 and greater than 20 BUN to creatinine ratio.  Patient with dry mucous membranes again suggesting the prerenal was the cause of his AKI. -IV fluids-we will do 1 L fluid bolus followed by maintenance fluids if the patient remains n.p.o.   All other conditions were chronic and stable   Follow up outpatient: Reassess  Jardiance Vasotec was decreased from 20 to 10 mg

## 2020-11-15 NOTE — Progress Notes (Signed)
[facility]  Date of Admission:  11/11/2020     Total days of antibiotics 5         ASSESSMENT:  Chad Hall is POD #2 from right BKA and doing well with some post-surgical pain. Blood cultures from 9/27 have remained without growth to date. Discussed plan of care to continue current dose of Daptomycin and need for TEE to rule out endocarditis which is planned for 10/3. Surgical wound care per Orthopedics and remaing care per primary team.   PLAN:  Continue current Daptomycin.  Await TEE results to determine duration of care.  Monitor blood cultures for continued clearance of bacteremia. Wound care per Orthopedics. Remaining medical care per primary team.    Active Problems:   DM (diabetes mellitus), type 2 with neurological complications (HCC)   Venous insufficiency of leg   Maggot infestation   Polymicrobial bacterial infection   Gangrene of right foot (HCC)   PVD (peripheral vascular disease) (HCC)   . vitamin C  1,000 mg Oral Daily  . atorvastatin  80 mg Oral Daily  . docusate sodium  100 mg Oral Daily  . gabapentin  300 mg Oral Daily  . heparin  5,000 Units Subcutaneous Q8H  . insulin aspart  0-9 Units Subcutaneous TID WC  . linagliptin  5 mg Oral QHS  . metoprolol succinate  12.5 mg Oral QPM  . mupirocin ointment  1 application Nasal BID  . pantoprazole  40 mg Oral Daily  . zinc sulfate  220 mg Oral Daily    SUBJECTIVE:  Afebrile overnight with no acute events. Has some pain in his leg otherwise doing well.   No Known Allergies   Review of Systems: Review of Systems  Constitutional:  Negative for chills, fever and weight loss.  Respiratory:  Negative for cough, shortness of breath and wheezing.   Cardiovascular:  Negative for chest pain and leg swelling.  Gastrointestinal:  Negative for abdominal pain, constipation, diarrhea, nausea and vomiting.  Musculoskeletal:        Positive for right leg pain  Skin:  Negative for  rash.     OBJECTIVE: Vitals:   11/14/20 0838 11/14/20 1509 11/14/20 2007 11/15/20 0809  BP: 122/72 133/72 138/64 122/67  Pulse: 78 91 84 73  Resp: 16 16 17 14   Temp: 98 F (36.7 C) 98.1 F (36.7 C) 97.6 F (36.4 C) 98.1 F (36.7 C)  TempSrc: Oral Oral Oral Oral  SpO2: 100% 99% 100% 100%  Weight:      Height:       Body mass index is 24.9 kg/m.  Physical Exam Constitutional:      General: He is not in acute distress.    Appearance: He is well-developed.  Cardiovascular:     Rate and Rhythm: Normal rate and regular rhythm.     Heart sounds: Normal heart sounds.  Pulmonary:     Effort: Pulmonary effort is normal.     Breath sounds: Normal breath sounds.  Skin:    General: Skin is warm and dry.  Neurological:     Mental Status: He is alert and oriented to person, place, and time.  Psychiatric:        Mood and Affect: Mood normal.    Lab Results Lab Results  Component Value Date   WBC 9.2 11/15/2020   HGB 8.9 (L) 11/15/2020   HCT 29.3 (L) 11/15/2020   MCV 92.1 11/15/2020   PLT 469 (H) 11/15/2020  Lab Results  Component Value Date   CREATININE 1.47 (H) 11/15/2020   BUN 41 (H) 11/15/2020   NA 141 11/15/2020   K 4.5 11/15/2020   CL 114 (H) 11/15/2020   CO2 21 (L) 11/15/2020    Lab Results  Component Value Date   ALT 25 11/14/2020   AST 33 11/14/2020   ALKPHOS 49 11/14/2020   BILITOT 0.6 11/14/2020     Microbiology: Recent Results (from the past 240 hour(s))  Blood culture (routine x 2)     Status: Abnormal   Collection Time: 11/11/20  8:53 AM   Specimen: BLOOD  Result Value Ref Range Status   Specimen Description BLOOD LEFT ANTECUBITAL  Final   Special Requests   Final    BOTTLES DRAWN AEROBIC AND ANAEROBIC Blood Culture adequate volume   Culture  Setup Time   Final    GRAM POSITIVE COCCI ANAEROBIC BOTTLE ONLY CRITICAL RESULT CALLED TO, READ BACK BY AND VERIFIED WITH: PHARMD JAMES LEDFORD 11/12/20@1 :29 BY TW IN BOTH AEROBIC AND ANAEROBIC  BOTTLES    Culture (A)  Final    GROUP B STREP(S.AGALACTIAE)ISOLATED METHICILLIN RESISTANT STAPHYLOCOCCUS AUREUS STAPHYLOCOCCUS HAEMOLYTICUS STAPHYLOCOCCUS EPIDERMIDIS THE SIGNIFICANCE OF ISOLATING THIS ORGANISM FROM A SINGLE SET OF BLOOD CULTURES WHEN MULTIPLE SETS ARE DRAWN IS UNCERTAIN. PLEASE NOTIFY THE MICROBIOLOGY DEPARTMENT WITHIN ONE WEEK IF SPECIATION AND SENSITIVITIES ARE REQUIRED. Performed at [facility], [address] [place], [place] [address]    Report Status 11/14/2020 FINAL  Final   Organism ID, Bacteria GROUP B STREP(S.AGALACTIAE)ISOLATED  Final   Organism ID, Bacteria METHICILLIN RESISTANT STAPHYLOCOCCUS AUREUS  Final      Susceptibility   Group b strep(s.agalactiae)isolated - MIC*    CLINDAMYCIN <=0.25 SENSITIVE Sensitive     AMPICILLIN <=0.25 SENSITIVE Sensitive     ERYTHROMYCIN 2 RESISTANT Resistant     VANCOMYCIN 0.5 SENSITIVE Sensitive     CEFTRIAXONE <=0.12 SENSITIVE Sensitive     LEVOFLOXACIN 1 SENSITIVE Sensitive     * GROUP B STREP(S.AGALACTIAE)ISOLATED   Methicillin resistant staphylococcus aureus - MIC*    CIPROFLOXACIN >=8 RESISTANT Resistant     ERYTHROMYCIN >=8 RESISTANT Resistant     GENTAMICIN <=0.5 SENSITIVE Sensitive     OXACILLIN >=4 RESISTANT Resistant     TETRACYCLINE <=1 SENSITIVE Sensitive     VANCOMYCIN 1 SENSITIVE Sensitive     TRIMETH/SULFA >=320 RESISTANT Resistant     CLINDAMYCIN <=0.25 SENSITIVE Sensitive     RIFAMPIN <=0.5 SENSITIVE Sensitive     Inducible Clindamycin NEGATIVE Sensitive     * METHICILLIN RESISTANT STAPHYLOCOCCUS AUREUS  Blood Culture ID Panel (Reflexed)     Status: Abnormal   Collection Time: 11/11/20  8:53 AM  Result Value Ref Range Status   Enterococcus faecalis NOT DETECTED NOT DETECTED Final   Enterococcus Faecium NOT DETECTED NOT DETECTED Final   Listeria monocytogenes NOT DETECTED NOT DETECTED Final   Staphylococcus species DETECTED (A) NOT DETECTED Final    Comment: CRITICAL RESULT CALLED TO,  READ BACK BY AND VERIFIED WITH: PHARMD JAMES LEDFORD 11/12/20@1 :29 BY TW    Staphylococcus aureus (BCID) DETECTED (A) NOT DETECTED Final    Comment: Methicillin (oxacillin)-resistant Staphylococcus aureus (MRSA). MRSA is predictably resistant to beta-lactam antibiotics (except ceftaroline). Preferred therapy is vancomycin unless clinically contraindicated. Patient requires contact precautions if  hospitalized. CRITICAL RESULT CALLED TO, READ BACK BY AND VERIFIED WITH: PHARMD JAMES LEDFORD 11/12/20@1 :29 BY TW    Staphylococcus epidermidis DETECTED (A) NOT DETECTED Final    Comment: CRITICAL RESULT  CALLED TO, READ BACK BY AND VERIFIED WITH: PHARMD JAMES LEDFORD 11/12/20@1 :29 BY TW    Staphylococcus lugdunensis NOT DETECTED NOT DETECTED Final   Streptococcus species DETECTED (A) NOT DETECTED Final    Comment: CRITICAL RESULT CALLED TO, READ BACK BY AND VERIFIED WITH: PHARMD JAMES LEDFORD 11/12/20@1 :29 BY TW    Streptococcus agalactiae DETECTED (A) NOT DETECTED Final    Comment: CRITICAL RESULT CALLED TO, READ BACK BY AND VERIFIED WITH: PHARMD JAMES LEDFORD 11/12/20@1 :29 BY TW    Streptococcus pneumoniae NOT DETECTED NOT DETECTED Final   Streptococcus pyogenes NOT DETECTED NOT DETECTED Final   A.calcoaceticus-baumannii NOT DETECTED NOT DETECTED Final   Bacteroides fragilis NOT DETECTED NOT DETECTED Final   Enterobacterales NOT DETECTED NOT DETECTED Final   Enterobacter cloacae complex NOT DETECTED NOT DETECTED Final   Escherichia coli NOT DETECTED NOT DETECTED Final   Klebsiella aerogenes NOT DETECTED NOT DETECTED Final   Klebsiella oxytoca NOT DETECTED NOT DETECTED Final   Klebsiella pneumoniae NOT DETECTED NOT DETECTED Final   Proteus species NOT DETECTED NOT DETECTED Final   Salmonella species NOT DETECTED NOT DETECTED Final   Serratia marcescens NOT DETECTED NOT DETECTED Final   Haemophilus influenzae NOT DETECTED NOT DETECTED Final   Neisseria meningitidis NOT DETECTED NOT DETECTED  Final   Pseudomonas aeruginosa NOT DETECTED NOT DETECTED Final   Stenotrophomonas maltophilia NOT DETECTED NOT DETECTED Final   Candida albicans NOT DETECTED NOT DETECTED Final   Candida auris NOT DETECTED NOT DETECTED Final   Candida glabrata NOT DETECTED NOT DETECTED Final   Candida krusei NOT DETECTED NOT DETECTED Final   Candida parapsilosis NOT DETECTED NOT DETECTED Final   Candida tropicalis NOT DETECTED NOT DETECTED Final   Cryptococcus neoformans/gattii NOT DETECTED NOT DETECTED Final   Methicillin resistance mecA/C DETECTED (A) NOT DETECTED Final    Comment: CRITICAL RESULT CALLED TO, READ BACK BY AND VERIFIED WITH: PHAMRD JAMES LEDFORD 11/12/20@1 :29 BY TW    Meth resistant mecA/C and MREJ DETECTED (A) NOT DETECTED Final    Comment: CRITICAL RESULT CALLED TO, READ BACK BY AND VERIFIED WITH: PHARMD JAMES LEDFORD 11/12/20@1 :29 BY TW Performed at [facility], [address] [place], [place] [address]   Blood culture (routine x 2)     Status: None (Preliminary result)   Collection Time: 11/11/20 10:18 AM   Specimen: BLOOD  Result Value Ref Range Status   Specimen Description BLOOD RIGHT ANTECUBITAL  Final   Special Requests   Final    BOTTLES DRAWN AEROBIC AND ANAEROBIC Blood Culture adequate volume   Culture   Final    NO GROWTH 3 DAYS Performed at [facility], [address] [place], [place] [address]    Report Status PENDING  Incomplete  SARS CORONAVIRUS 2 (TAT 6-24 HRS) Nasopharyngeal Nasopharyngeal Swab     Status: None   Collection Time: 11/11/20 12:55 PM   Specimen: Nasopharyngeal Swab  Result Value Ref Range Status   SARS Coronavirus 2 NEGATIVE NEGATIVE Final    Comment: (NOTE) SARS-CoV-2 target nucleic acids are NOT DETECTED.  The SARS-CoV-2 RNA is generally detectable in upper and lower respiratory specimens during the acute phase of infection. Negative results do not preclude SARS-CoV-2 infection, do not rule out co-infections with other  pathogens, and should not be used as the sole basis for treatment or other patient management decisions. Negative results must be combined with clinical observations, patient history, and epidemiological information. The expected result is Negative.  Fact Sheet for Patients: HairSlick.no  Fact Sheet for Healthcare Providers: quierodirigir.com  This test is not yet approved or cleared by the [place] FDA and  has been authorized for detection and/or diagnosis of SARS-CoV-2 by FDA under an Emergency Use Authorization (EUA). This EUA will remain  in effect (meaning this test can be used) for the duration of the COVID-19 declaration under Se ction 564(b)(1) of the Act, 21 U.S.C. section 360bbb-3(b)(1), unless the authorization is terminated or revoked sooner.  Performed at [facility], [address], [place], [place] [address]   Culture, blood (routine x 2)     Status: None (Preliminary result)   Collection Time: 11/12/20 10:20 AM   Specimen: BLOOD LEFT HAND  Result Value Ref Range Status   Specimen Description BLOOD LEFT HAND  Final   Special Requests   Final    BOTTLES DRAWN AEROBIC AND ANAEROBIC Blood Culture adequate volume   Culture   Final    NO GROWTH 3 DAYS Performed at [facility], [address] [place], [place] [address]    Report Status PENDING  Incomplete  Culture, blood (routine x 2)     Status: None (Preliminary result)   Collection Time: 11/12/20  3:55 PM   Specimen: BLOOD  Result Value Ref Range Status   Specimen Description BLOOD RIGHT ANTECUBITAL  Final   Special Requests   Final    BOTTLES DRAWN AEROBIC AND ANAEROBIC Blood Culture results may not be optimal due to an excessive volume of blood received in culture bottles   Culture   Final    NO GROWTH 3 DAYS Performed at [facility], [address], [place], [place] [address]    Report Status PENDING  Incomplete  Surgical PCR  screen     Status: Abnormal   Collection Time: 11/13/20 11:06 AM   Specimen: Nasal Mucosa; Nasal Swab  Result Value Ref Range Status   MRSA, PCR POSITIVE (A) NEGATIVE Final    Comment: RESULT CALLED TO, READ BACK BY AND VERIFIED WITH: RN K.HARVELL AT 1314 ON 11/13/2020 BY T.SAAD.    Staphylococcus aureus POSITIVE (A) NEGATIVE Final    Comment: (NOTE) The Xpert SA Assay (FDA approved for NASAL specimens in patients 17 years of age and older), is one component of a comprehensive surveillance program. It is not intended to diagnose infection nor to guide or monitor treatment. Performed at [facility], [address] [place], [place] [address]      Marcos Eke, NP [facility]  11/15/2020  10:52 AM

## 2020-11-15 NOTE — Progress Notes (Signed)
Initial Nutrition Assessment  DOCUMENTATION CODES:   Not applicable  INTERVENTION:   -D/c Juven -MVI with minerals daily -Ensure Max po daily, each supplement provides 150 kcal and 30 grams of protein -Double protein portions with meals  NUTRITION DIAGNOSIS:   Increased nutrient needs related to post-op healing as evidenced by estimated needs.  GOAL:   Patient will meet greater than or equal to 90% of their needs  MONITOR:   PO intake, Supplement acceptance, Labs, Weight trends, Skin, I & O's  REASON FOR ASSESSMENT:   Other (Comment)    ASSESSMENT:   Chad Hall is a 70 y.o. male presenting with foot wound and concern for osteomyelitis. PMH is significant for blindness in both eyes, type 2 diabetes, hypertension, hyperlipidemia  Pt admitted with osteomyelitis of the rt foot.   9/28- s/p PROCEDURE: Transtibial amputation Application of Prevena wound VAC  Reviewed I/O's: -61 ml x 24 hours and +2.3 L since admission  UOP: 1.1 L x 24 hours  Spoke with pt at bedside, who was pleasant and in good spirits today. He reports that he has a very good appetite- he consumed about 90% of his breakfast tray. Noted meal completions 75%.   PTA pt reports good appetite. Pt reports he usually consumes  meals per day (Breakfast ( x 2): eggs and toast OR cereal and eggs OR hash browns and eggs; Lunch: sandwich; Dinner: meat, starch, and vegetable). Per pt he enjoys cooking and cooks for himself. He is mindful of salt and sugar intake in his diet.   Per pt, he has not lost weight. He reports his UBW is around 205-210#. Reviewed wt hx; wt has been stable over the past 6 months.   Discussed importance of good meal and supplement intake to promote healing. Juven d/c due to Sport and exercise psychologist.   Medications reviewed and include vitamin C, colace,  and zinc sulfate.   Lab Results  Component Value Date   HGBA1C 6.9 (H) 11/12/2020   PTA DM medications are 5 mg linagliptin daily and 10  mg empagliflozin daily.   Labs reviewed: CBGS: 105-168 (inpatient orders for glycemic control are ).    NUTRITION - FOCUSED PHYSICAL EXAM:  Flowsheet Row Most Recent Value  Orbital Region No depletion  Upper Arm Region No depletion  Thoracic and Lumbar Region No depletion  Buccal Region No depletion  Temple Region No depletion  Clavicle Bone Region No depletion  Clavicle and Acromion Bone Region No depletion  Scapular Bone Region No depletion  Dorsal Hand Mild depletion  Patellar Region Unable to assess  Anterior Thigh Region Unable to assess  Posterior Calf Region Unable to assess  Edema (RD Assessment) None  Hair Reviewed  Eyes Reviewed  Mouth Reviewed  Skin Reviewed  Nails Reviewed       Diet Order:   Diet Order             Diet Carb Modified Fluid consistency: Thin; Room service appropriate? Yes  Diet effective now                   EDUCATION NEEDS:   Education needs have been addressed  Skin:  Skin Assessment: Skin Integrity Issues: Skin Integrity Issues:: Wound VAC Wound Vac: closed rt leg, s/p rt BKA  Last BM:  11/10/20  Height:   Ht Readings from Last 1 Encounters:  11/13/20 6\' 5"  (1.956 m)    Weight:   Wt Readings from Last 1 Encounters:  11/13/20 95.3 kg  Ideal Body Weight:  83.2 kg (adjusted for bilateral BKA)  BMI:  Body mass index is 24.9 kg/m.  Estimated Nutritional Needs:   Kcal:  2300-2500  Protein:  125-150 grams  Fluid:  > 2 L    Levada Schilling, RD, LDN, CDCES Registered Dietitian II Certified Diabetes Care and Education Specialist Please refer to [facility] for RD and/or RD on-call/weekend/after hours pager

## 2020-11-15 NOTE — Progress Notes (Signed)
FPTS Interim Progress Note  S:Rounded with night nurse expressed no acute concerns.   O: BP (!) 144/59 (BP Location: Left Arm)   Pulse 61   Temp 97.6 F (36.4 C) (Oral)   Resp 14   Ht 6\' 5"  (1.956 m)   Wt 95.3 kg   SpO2 100%   BMI 24.90 kg/m    Gen: NAD, resting in bed  A/P: Gangrene Right Foot s/p BKA POD2 On daptomycin currently -Labs and orders reviewed and adjusted as necessary  , MD 11/15/2020, 10:00 PM PGY-1, [facility] Service pager [phone]

## 2020-11-15 NOTE — Progress Notes (Addendum)
FPTS Brief Progress Note  S: Lying in bed. Nursing staff at bedside.    O: BP 138/64 (BP Location: Left Arm)   Pulse 84   Temp 97.6 F (36.4 C) (Oral)   Resp 17   Ht 6\' 5"  (1.956 m)   Wt 95.3 kg   SpO2 100%   BMI 24.90 kg/m   General: Appears well, no acute distress. Age appropriate. Respiratory: normal effort Extremities: Right BKA with clean dressing Skin: Warm and dry, no rashes noted Neuro: alert and oriented Psych: normal affect   A/P: S/p Rt. BKA 2/2 gangrene POD #1 - Orders reviewed. Labs for AM ordered, which was adjusted as needed.   , DO 11/15/2020, 12:08 AM PGY-3, [facility] Night Resident  Please page [phone] with questions.

## 2020-11-15 NOTE — Progress Notes (Signed)
Family Medicine Teaching Service Daily Progress Note Intern Pager: [phone], appreciate recs  Patient name: Chad Hall Medical record number: [id number] Date of birth: [birth year] Age: 71 y.o. Gender: male  Primary Care Provider: Evelena Leyden, DO Consultants: Orthopedic surgery, Vascular surgery Code Status: Full  Pt Overview and Major Events to Date:  Patient admitted - 11/11/20 BKA - 11/13/20  Assessment and Plan: Chad Hall is a 70 y.o. male presenting with foot wound and concern for osteomyelitis. PMH is significant for blindness in both eyes, type 2 diabetes, hypertension, hyperlipidemia   Gangrene Right Foot  Patient is POD 2 for BKA, hemoglobin 8.9 today from 9.5 yesterday.  -Orthopedic surgery following appreciate recs -Vascular surgery following, appreciate recs -Continue ABX until TEE, concern for bacteremia.  Daptomycin (9/27-) day 4/14. Previously cefazolin (9/28-9/29), Vanc (9/26-9/27), Cefepime (9/26), and Flagyl (9/26) -Therapeutic monitoring of CK levels with Daptomycin  -TEE Monday 0930  AKI Creatinine 1.47 today (1.43, 1.6, 1.66) on admission was 2.16, baseline 1.0-1.1 -Encourage PO hydration -1 L IVF bolus  Fatigue  generalized weakness: -PT/OT following, appreciate recs -Home with HH  DM2 Medications include Jardiance 10 mg daily, Tradjenta 5 mg nightly.  Most recent A1c 6.9 on 11/12/2020, CBG today 105-156 -Continue home meds  HTN Home medications include enalapril 20 mg, metoprolol succinate 12.5 mg daily, BPs ranged 120-130's/ 64-72 -Continue metoprolol 12.5 mg daily -Hold enalapril in setting of AKI  Diabetic Neuropathy -Continue gabapentin 300 mg daily, renally adjusted for AKI  HFpEF Most recent echo 11/12/20 with grade 1 diastolic dysfunction, normal LV systolic function EF 60-65%  History of Blindness Patient blind since 1967 due to retinal detachment lives with spouse was also -SW following, appreciate recs   FEN/GI:  Heart Healthy / Carb modified PPx: Heparin sub q Dispo:Home with home health  in 2-3 days.   Subjective:  Patient admitted - 11/11/20 BKA - 11/13/20  Objective: Temp:  [97.6 F (36.4 C)-98.1 F (36.7 C)] 97.6 F (36.4 C) (09/29 2007) Pulse Rate:  [78-91] 84 (09/29 2007) Resp:  [16-17] 17 (09/29 2007) BP: (122-138)/(64-72) 138/64 (09/29 2007) SpO2:  [99 %-100 %] 100 % (09/29 2007) Physical Exam: General: African American male, well nourished, no acute signs of distress, sitting comfortably in bed Cardiovascular: RRR, NRMG Respiratory: CTABL, no wheezes or stridor Abdomen: Soft, NT/ND Extremities: Pulses intact in UE  Laboratory: Recent Labs  Lab 11/13/20 1717 11/14/20 0135 11/15/20 0319  WBC 9.9 11.0* 9.2  HGB 10.1* 9.5* 8.9*  HCT 33.4* 31.2* 29.3*  PLT 494* 446* 469*   Recent Labs  Lab 11/12/20 0948 11/13/20 0329 11/14/20 0135 11/14/20 0625 11/15/20 0319  NA 136 136 138 138 141  K 4.7 4.8 5.5* 5.0 4.5  CL 106 108 111 110 114*  CO2 22 21* 19* 21* 21*  BUN 34* 36* 40* 41* 41*  CREATININE 1.66* 1.60* 1.43* 1.41* 1.47*  CALCIUM 8.6* 8.3* 8.4* 8.3* 8.1*  PROT 7.4 6.5 6.8  --   --   BILITOT 0.7 0.5 0.6  --   --   ALKPHOS 52 51 49  --   --   ALT 30 32 25  --   --   AST 33 37 33  --   --   GLUCOSE 136* 119* 171* 162* 156*     Imaging/Diagnostic Tests:   Bess Kinds, MD 11/15/2020, 7:51 AM PGY-1, [facility] FPTS Intern pager: [phone], text pages welcome

## 2020-11-15 NOTE — Progress Notes (Signed)
Family Medicine Teaching Service Daily Progress Note Intern Pager: [phone]  Patient name: Chad Hall Medical record number: [id number] Date of birth: 02/16/1951 Age: 70 y.o. Gender: male  Primary Care Provider: Evelena Leyden, DO Consultants: Orthopedic Surgery, Vascular Surgery Code Status: Full  Pt Overview and Major Events to Date:  9/26 admitted 9/28 BKA  Assessment and Plan:  Chad Hall is a 70 yo male presenting with foot wound concerning for osteomyelitis. PMH significant for blindness in both eyes, T2DM, HTN, HLD  Gangrene of Right Foot s/p BKA POD 3  MRSA bacteremia BKA site with guard around, no active drainage noted. Denies SOB, CP, feeling hot/cold, pain. Blood cultures on 9/26 grew Strep agalactiae, MRSA, Staph Haemolyticus, and Staph Epidermidis. Repeat blood cultures on 9/27 has NGTD. -Orthopedic surgery following, appreciate assistance -Contact precautions  -IV Daptomycin (Day 5/14) s/p cefazolin, vancomycin,cefepime, flagyl -Tylenol prn q6h for pain, oxycodone for moderate pain q4h prn -TEE scheduled for 10/3  AKI Creatinine today is 1.17 from 1.47 yesterday. Baseline 1.0-1.1. -PO hydration  DM2  diabetic neuropathy Home medications of jardiance 10 mg daily, tradjenta 5 mg daily, and gabapentine for diabetic neuropathy. CBGs were 157, 129. -Tradjenta 5 mg continued in hospital -sSSI  Fatigue - generalized weakness -PT/OT following, appreciate assistance  HTN Bps have been 144/59. Home medications of enalapril, metoprolol.  -metoprolol 12.5 daily while in hospital   HFpEF Echo of EF 60-65% on 9/27.  -monitor for symptoms  Blindness Has been blind since 1967 due to retinal detachment and spouse blind as well. -SW following, appreciate recs  FEN/GI: Heart Healthy/Carb modified PPx: Heparin Dispo:Home with home health  pending TEE Monday 10/3  Subjective:  No complaints this AM. Awaiting TEE  Objective: Temp:  [97.6 F (36.4  C)-98.1 F (36.7 C)] 97.6 F (36.4 C) (09/30 1519) Pulse Rate:  [57-73] 61 (09/30 1942) Resp:  [14-17] 14 (09/30 1942) BP: (122-144)/(59-67) 144/59 (09/30 1942) SpO2:  [98 %-100 %] 100 % (09/30 1942) Physical Exam: General: NAD Cardiovascular: RRR Respiratory: CTAB, no w/r/c Abdomen: Nondistended nontender to palpation Extremities: guard in place, no active drainage.   Laboratory: Recent Labs  Lab 11/14/20 0135 11/15/20 0319 11/16/20 0242  WBC 11.0* 9.2 8.5  HGB 9.5* 8.9* 9.4*  HCT 31.2* 29.3* 31.4*  PLT 446* 469* 514*   Recent Labs  Lab 11/12/20 0948 11/13/20 0329 11/14/20 0135 11/14/20 0625 11/15/20 0319 11/16/20 0242  NA 136 136 138 138 141 139  K 4.7 4.8 5.5* 5.0 4.5 4.5  CL 106 108 111 110 114* 111  CO2 22 21* 19* 21* 21* 22  BUN 34* 36* 40* 41* 41* 36*  CREATININE 1.66* 1.60* 1.43* 1.41* 1.47* 1.17  CALCIUM 8.6* 8.3* 8.4* 8.3* 8.1* 8.2*  PROT 7.4 6.5 6.8  --   --   --   BILITOT 0.7 0.5 0.6  --   --   --   ALKPHOS 52 51 49  --   --   --   ALT 30 32 25  --   --   --   AST 33 37 33  --   --   --   GLUCOSE 136* 119* 171* 162* 156* 129*      Imaging/Diagnostic Tests:   Levin Erp, MD 11/16/2020, 6:39 AM PGY-1, [facility] FPTS Intern pager: [phone], text pages welcome

## 2020-11-16 LAB — CBC
HCT: 31.4 % — ABNORMAL LOW (ref 39.0–52.0)
Hemoglobin: 9.4 g/dL — ABNORMAL LOW (ref 13.0–17.0)
MCH: 28 pg (ref 26.0–34.0)
MCHC: 29.9 g/dL — ABNORMAL LOW (ref 30.0–36.0)
MCV: 93.5 fL (ref 80.0–100.0)
Platelets: 514 10*3/uL — ABNORMAL HIGH (ref 150–400)
RBC: 3.36 MIL/uL — ABNORMAL LOW (ref 4.22–5.81)
RDW: 12.8 % (ref 11.5–15.5)
WBC: 8.5 10*3/uL (ref 4.0–10.5)
nRBC: 0 % (ref 0.0–0.2)

## 2020-11-16 LAB — BASIC METABOLIC PANEL
Anion gap: 6 (ref 5–15)
BUN: 36 mg/dL — ABNORMAL HIGH (ref 8–23)
CO2: 22 mmol/L (ref 22–32)
Calcium: 8.2 mg/dL — ABNORMAL LOW (ref 8.9–10.3)
Chloride: 111 mmol/L (ref 98–111)
Creatinine, Ser: 1.17 mg/dL (ref 0.61–1.24)
GFR, Estimated: >60 mL/min (ref >60)
Glucose, Bld: 129 mg/dL — ABNORMAL HIGH (ref 70–99)
Potassium: 4.5 mmol/L (ref 3.5–5.1)
Sodium: 139 mmol/L (ref 135–145)

## 2020-11-16 LAB — GLUCOSE, CAPILLARY
Glucose-Capillary: 153 mg/dL — ABNORMAL HIGH (ref 70–99)
Glucose-Capillary: 112 mg/dL — ABNORMAL HIGH (ref 70–99)
Glucose-Capillary: 156 mg/dL — ABNORMAL HIGH (ref 70–99)
Glucose-Capillary: 139 mg/dL — ABNORMAL HIGH (ref 70–99)

## 2020-11-16 NOTE — Progress Notes (Signed)
Occupational Therapy Treatment Patient Details Name: Chad Hall MRN: [id number] DOB: [birth year] Today's Date: 11/16/2020   History of present illness 70 yo male s/p R BKA on 9/28 for R foot gangrene with maggot infestation. PMH includes L BKA, DM2, peripheral neuropathy, HTN, Marfan's syndrome, bilat blindness.   OT comments  Pt received asleep, yet easy to arouse and agreeable to OT session. Pt is progressing toward established goals, however, still limited to safety awareness, decreased functional mobility and strength. During session, pt demonstrated min guard bed mobility, UE HEP with theraband with heavy tactile and verbal cueing for sequencing and placement of theraband due to visual deficits. Pt educated of HEP to perform during remainder of POC to ensure improved strength for functional transitions and transfers. During session, visitors brought in pt's personal w/c. DC recommendation and frequency remains the same. OT will continue to follow.    Recommendations for follow up therapy are one component of a multi-disciplinary discharge planning process, led by the attending physician.  Recommendations may be updated based on patient status, additional functional criteria and insurance authorization.    Follow Up Recommendations  Home health OT    Equipment Recommendations  None recommended by OT    Recommendations for Other Services      Precautions / Restrictions Precautions Precautions: Fall Precaution Comments: R BKA limb protector, L BKA with prosthesis Restrictions Weight Bearing Restrictions: Yes RLE Weight Bearing: Non weight bearing Other Position/Activity Restrictions: NWB on operative extremity       Mobility Bed Mobility Overal bed mobility: Needs Assistance Bed Mobility: Supine to Sit     Supine to sit: Min guard;HOB elevated     General bed mobility comments: close guard for safety, increased time and effort to perform especially scooting to EOB.  verbal and HOH cues for hand rail position.    Transfers Overall transfer level: Needs assistance Equipment used: None Transfers: Lateral/Scoot Transfers          Lateral/Scoot Transfers: Min guard General transfer comment: close guard for safety with scoot toward [facility], cues for rail placement due to visual deficits.    Balance Overall balance assessment: Needs assistance Sitting-balance support: Feet supported;No upper extremity supported Sitting balance-Leahy Scale: Good Sitting balance - Comments: able to sit EOB and scoot laterally without LOB       Standing balance comment: nt                           ADL either performed or assessed with clinical judgement   ADL Overall ADL's : Needs assistance/impaired                                       General ADL Comments: Pt declined OOB transfer, session focused soley on UE HEP with theraband.     Vision   Additional Comments: blindness of both eyes   Perception     Praxis      Cognition Arousal/Alertness: Awake/alert Behavior During Therapy: WFL for tasks assessed/performed Overall Cognitive Status: Within Functional Limits for tasks assessed                                 General Comments: Very motivated        Exercises Exercises: General Upper Extremity General Exercises - Upper Extremity Elbow Flexion: AROM;Strengthening;Both;Other reps (  comment);Theraband (30 reps) Theraband Level (Elbow Flexion): Level 2 (Red) Elbow Extension: AROM;Strengthening;Both;Other reps (comment);Theraband (30 reps) Theraband Level (Elbow Extension): Level 2 (Red)   Shoulder Instructions       General Comments wound vac    Pertinent Vitals/ Pain       Pain Assessment: No/denies pain  Home Living                                          Prior Functioning/Environment              Frequency  Min 2X/week        Progress Toward Goals  OT  Goals(current goals can now be found in the care plan section)  Progress towards OT goals: Progressing toward goals  Acute Rehab OT Goals Patient Stated Goal: Go home OT Goal Formulation: With patient Time For Goal Achievement: 11/28/20 Potential to Achieve Goals: Good ADL Goals Pt Will Perform Lower Body Dressing: with modified independence;sitting/lateral leans Pt Will Transfer to Toilet: with modified independence;with transfer board;bedside commode;stand pivot transfer Pt Will Perform Toileting - Clothing Manipulation and hygiene: with modified independence;sitting/lateral leans;sit to/from stand  Plan Discharge plan remains appropriate;Frequency remains appropriate    Co-evaluation                 AM-PAC OT "6 Clicks" Daily Activity     Outcome Measure   Help from another person eating meals?: None Help from another person taking care of personal grooming?: A Little Help from another person toileting, which includes using toliet, bedpan, or urinal?: A Little Help from another person bathing (including washing, rinsing, drying)?: A Little Help from another person to put on and taking off regular upper body clothing?: A Little Help from another person to put on and taking off regular lower body clothing?: A Little 6 Click Score: 19    End of Session Equipment Utilized During Treatment: Other (comment) (prosthetic LLE)  OT Visit Diagnosis: Other abnormalities of gait and mobility (R26.89);Unsteadiness on feet (R26.81);Muscle weakness (generalized) (M62.81);Pain Pain - Right/Left: Right Pain - part of body: Leg   Activity Tolerance Patient tolerated treatment well   Patient Left in chair;with call bell/phone within reach;with chair alarm set   Nurse Communication Mobility status        Time: 8338-2505 OT Time Calculation (min): 24 min  Charges: OT General Charges $OT Visit: 1 Visit OT Treatments $Therapeutic Exercise: 23-37 mins  [place] Old, MSOT,  OTR/L  Supplemental Rehabilitation Services  [phone]   Zigmund Daniel 11/16/2020, 12:21 PM

## 2020-11-16 NOTE — Progress Notes (Addendum)
Family Medicine Teaching Service Daily Progress Note Intern Pager: [phone]  Patient name: Chad Hall Medical record number: [id number] Date of birth: 1950-08-29 Age: 70 y.o. Gender: male  Primary Care Provider: Evelena Leyden, DO Consultants: Orthopedic surgery, vascular surgery   Code Status: Full Code  Pt Overview and Major Events to Date:  9/26 admitted 9/28 BKA  Assessment and Plan: Chad Hall is a 70 y.o. male presenting with gangrene of the right foot s/p BKA. PMH significant for blindness in both eyes, T2DM, HTN, HLD, HFpEF  Gangrene of Right Foot  POD4 s/p BKA  MRSA bacteremia Pain is well-controlled. Blood cultures on 9/26 grew Strep agalactiae, MRSA, Staph Haemolyticus, and Staph Epidermidis. Repeat blood cultures on 9/27 has NGTD. - orthopedic surgery following, appreciate involvement - IV daptomycin day 6/14 -Aetaminophen prn - oxycodone prn - scheduled for TEE 10/3 -NPO at midnight   AKI Resolved.Latest creatinine is 1.17 -Encourage PO fluid intake   T2DM Well-controlled. - home linagliptin 5 mg daily - sSSI - hold empagliflozin - gabapentin 300 mg daily  HTN Well controlled. BP is 123/67 - metoprolol succinate 12.5 mg daily - hold enalapril  FEN/GI: NPO at midnight for TEE (10/3) PPx: [facility]  Disposition: Home with home health, pending TEE 10/3  Subjective:  [name] was admitted for right foot gangrene and POD4 of BKA. Patient said he slept well through the night and his pain is well controlled. He denies having any chest pains, dyspnea or fever. His last BM was yesterday and has no constipation.   Objective: Temp:  [97.9 F (36.6 C)-98.5 F (36.9 C)] 97.9 F (36.6 C) (10/01 2000) Pulse Rate:  [55-81] 55 (10/01 2000) Resp:  [15-16] 16 (10/01 2000) BP: (118-148)/(61-73) 148/61 (10/01 2000) SpO2:  [99 %-100 %] 100 % (10/01 2000) Physical Exam: General: Awake, Pleasant, NAD  Cardiovascular: RRR, no murmur, Normal  S1/S2 Respiratory: CTAB,no wheezing, no crackles  Abdomen: soft, no distension , no tenderness Extremities: BKA amputation bilaterally. Surgical site on right LE is dressed and covered with ortho device, no signs of drainage.   Laboratory: Recent Labs  Lab 11/14/20 0135 11/15/20 0319 11/16/20 0242  WBC 11.0* 9.2 8.5  HGB 9.5* 8.9* 9.4*  HCT 31.2* 29.3* 31.4*  PLT 446* 469* 514*   Recent Labs  Lab 11/12/20 0948 11/13/20 0329 11/14/20 0135 11/14/20 0625 11/15/20 0319 11/16/20 0242  NA 136 136 138 138 141 139  K 4.7 4.8 5.5* 5.0 4.5 4.5  CL 106 108 111 110 114* 111  CO2 22 21* 19* 21* 21* 22  BUN 34* 36* 40* 41* 41* 36*  CREATININE 1.66* 1.60* 1.43* 1.41* 1.47* 1.17  CALCIUM 8.6* 8.3* 8.4* 8.3* 8.1* 8.2*  PROT 7.4 6.5 6.8  --   --   --   BILITOT 0.7 0.5 0.6  --   --   --   ALKPHOS 52 51 49  --   --   --   ALT 30 32 25  --   --   --   AST 33 37 33  --   --   --   GLUCOSE 136* 119* 171* 162* 156* 129*      Imaging/Diagnostic Tests: No imagining   Littie Deeds, MD 11/17/2020, 5:42 AM PGY-2, [facility] FPTS Intern pager: [phone], text pages welcome

## 2020-11-16 NOTE — Plan of Care (Signed)

## 2020-11-17 DIAGNOSIS — B9561 Methicillin susceptible Staphylococcus aureus infection as the cause of diseases classified elsewhere: Secondary | ICD-10-CM

## 2020-11-17 DIAGNOSIS — H543 Unqualified visual loss, both eyes: Secondary | ICD-10-CM

## 2020-11-17 DIAGNOSIS — R7881 Bacteremia: Secondary | ICD-10-CM

## 2020-11-17 HISTORY — DX: Methicillin susceptible Staphylococcus aureus infection as the cause of diseases classified elsewhere: B95.61

## 2020-11-17 LAB — GLUCOSE, CAPILLARY
Glucose-Capillary: 144 mg/dL — ABNORMAL HIGH (ref 70–99)
Glucose-Capillary: 147 mg/dL — ABNORMAL HIGH (ref 70–99)
Glucose-Capillary: 160 mg/dL — ABNORMAL HIGH (ref 70–99)

## 2020-11-17 LAB — CULTURE, BLOOD (ROUTINE X 2)
Culture: NO GROWTH
Culture: NO GROWTH
Culture: NO GROWTH
Special Requests: ADEQUATE
Special Requests: ADEQUATE

## 2020-11-17 NOTE — Progress Notes (Signed)
FPTS Brief Progress Note  S:Spoke with RN, no concerns.    O: BP (!) 148/61 (BP Location: Left Arm)   Pulse (!) 55   Temp 97.9 F (36.6 C) (Oral)   Resp 16   Ht 6\' 5"  (1.956 m)   Wt 95.3 kg   SpO2 100%   BMI 24.90 kg/m     A/P: Gangrene of Right Foot, POD4 s/p BKA MRSA bacteremia Remains afebrile.  Plan for TEE 10/3. - Orders reviewed. Labs for AM not ordered, which was adjusted as needed.   , MD 11/17/2020, 12:18 AM PGY-2,  Family Medicine Night Resident  Please page [phone] with questions.

## 2020-11-17 NOTE — H&P (View-Only) (Signed)
FPTS Brief Progress Note  S:Spoke with RN, no concerns.    O: BP (!) 148/61 (BP Location: Left Arm)   Pulse (!) 55   Temp 97.9 F (36.6 C) (Oral)   Resp 16   Ht 6' 5" (1.956 m)   Wt 95.3 kg   SpO2 100%   BMI 24.90 kg/m     A/P: Gangrene of Right Foot, POD4 s/p BKA MRSA bacteremia Remains afebrile.  Plan for TEE 10/3. - Orders reviewed. Labs for AM not ordered, which was adjusted as needed.   Beyla Loney, MD 11/17/2020, 12:18 AM PGY-2, [place] [facility] Night Resident  Please page [phone] with questions.    

## 2020-11-18 ENCOUNTER — Other Ambulatory Visit: Payer: Self-pay

## 2020-11-18 ENCOUNTER — Encounter (HOSPITAL_COMMUNITY)
Admission: EM | Disposition: A | Discharge status: Home Health | Payer: Self-pay | Source: Home / Self Care | Attending: Family Medicine

## 2020-11-18 ENCOUNTER — Inpatient Hospital Stay (HOSPITAL_COMMUNITY)
Admit: 2020-11-18 | Discharge: 2020-11-18 | Disposition: A | Discharge status: Home / Self Care | Payer: Medicare Other | Attending: Nurse Practitioner | Admitting: Nurse Practitioner

## 2020-11-18 ENCOUNTER — Other Ambulatory Visit (HOSPITAL_COMMUNITY): Payer: Self-pay

## 2020-11-18 ENCOUNTER — Inpatient Hospital Stay (HOSPITAL_COMMUNITY): Payer: Medicare Other | Admitting: Certified Registered Nurse Anesthetist

## 2020-11-18 ENCOUNTER — Encounter (HOSPITAL_COMMUNITY): Payer: Self-pay | Admitting: Family Medicine

## 2020-11-18 DIAGNOSIS — I081 Rheumatic disorders of both mitral and tricuspid valves: Secondary | ICD-10-CM

## 2020-11-18 DIAGNOSIS — I96 Gangrene, not elsewhere classified: Secondary | ICD-10-CM | POA: Diagnosis not present

## 2020-11-18 DIAGNOSIS — I77819 Aortic ectasia, unspecified site: Secondary | ICD-10-CM

## 2020-11-18 DIAGNOSIS — R7881 Bacteremia: Secondary | ICD-10-CM

## 2020-11-18 HISTORY — PX: TEE WITHOUT CARDIOVERSION: SHX5443

## 2020-11-18 LAB — ECHO TEE
AR max vel: 3.22 cm2
AV Area VTI: 3.44 cm2
AV Area mean vel: 3.26 cm2
AV Mean grad: 4 mmHg
AV Peak grad: 7.4 mmHg
Ao pk vel: 1.36 m/s

## 2020-11-18 LAB — GLUCOSE, CAPILLARY
Glucose-Capillary: 117 mg/dL — ABNORMAL HIGH (ref 70–99)
Glucose-Capillary: 128 mg/dL — ABNORMAL HIGH (ref 70–99)
Glucose-Capillary: 155 mg/dL — ABNORMAL HIGH (ref 70–99)

## 2020-11-18 SURGERY — ECHOCARDIOGRAM, TRANSESOPHAGEAL
Anesthesia: General

## 2020-11-18 MED ORDER — PHENYLEPHRINE 40 MCG/ML (10ML) SYRINGE FOR IV PUSH (FOR BLOOD PRESSURE SUPPORT)
PREFILLED_SYRINGE | INTRAVENOUS | Status: DC | PRN
  Administered 2020-11-18 (×2): 80 ug via INTRAVENOUS
  Administered 2020-11-18: 40 ug via INTRAVENOUS

## 2020-11-18 MED ORDER — SODIUM CHLORIDE 0.9 % IV SOLN
INTRAVENOUS | Status: DC | PRN
  Administered 2020-11-18: 250 mL via INTRAVENOUS

## 2020-11-18 MED ORDER — PROPOFOL 10 MG/ML IV BOLUS
INTRAVENOUS | Status: DC | PRN
  Administered 2020-11-18 (×2): 25 mg via INTRAVENOUS

## 2020-11-18 MED ORDER — ALUM & MAG HYDROXIDE-SIMETH 400-400-40 MG/5ML PO SUSP
15.0000 mL | ORAL | 0 refills | Status: DC | PRN
  Filled 2020-11-18 (×2): qty 355, 1d supply, fill #0

## 2020-11-18 MED ORDER — LIDOCAINE 2% (20 MG/ML) 5 ML SYRINGE
INTRAMUSCULAR | Status: DC | PRN
  Administered 2020-11-18: 40 mg via INTRAVENOUS

## 2020-11-18 MED ORDER — OXYCODONE HCL 5 MG PO TABS
5.0000 mg | ORAL_TABLET | ORAL | 0 refills | Status: AC | PRN
  Filled 2020-11-18: qty 30, 5d supply, fill #0

## 2020-11-18 MED ORDER — SODIUM CHLORIDE 0.9 % IV SOLN: INTRAVENOUS | Status: DC

## 2020-11-18 MED ORDER — LINEZOLID 600 MG PO TABS
600.0000 mg | ORAL_TABLET | Freq: Two times a day (BID) | ORAL | 0 refills | Status: AC
  Filled 2020-11-18: qty 14, 7d supply, fill #0

## 2020-11-18 MED ORDER — ACETAMINOPHEN 325 MG PO TABS: 650.0000 mg | ORAL_TABLET | Freq: Four times a day (QID) | ORAL | 0 refills | Status: AC | PRN

## 2020-11-18 MED ORDER — ENALAPRIL MALEATE 20 MG PO TABS: 10.0000 mg | ORAL_TABLET | Freq: Every day | ORAL | 3 refills | Status: DC

## 2020-11-18 MED ORDER — BISACODYL 5 MG PO TBEC: 5.0000 mg | DELAYED_RELEASE_TABLET | Freq: Every day | ORAL | 0 refills | Status: DC | PRN

## 2020-11-18 MED ORDER — PROPOFOL 500 MG/50ML IV EMUL
INTRAVENOUS | Status: DC | PRN
  Administered 2020-11-18: 100 ug/kg/min via INTRAVENOUS

## 2020-11-18 MED ORDER — ALUM & MAG HYDROXIDE-SIMETH 200-200-20 MG/5ML PO SUSP: 15.0000 mL | ORAL | 0 refills | Status: DC | PRN

## 2020-11-18 MED ORDER — BISACODYL 5 MG PO TBEC
5.0000 mg | DELAYED_RELEASE_TABLET | Freq: Every day | ORAL | 0 refills | Status: DC | PRN
Start: 2020-11-18 — End: 2021-04-17
  Filled 2020-11-18: qty 30, 30d supply, fill #0

## 2020-11-18 MED ORDER — OXYCODONE HCL 5 MG PO TABS: 5.0000 mg | ORAL_TABLET | ORAL | 0 refills | Status: DC | PRN

## 2020-11-18 NOTE — Progress Notes (Signed)
Orthopedic Tech Progress Note Patient Details:  Chad Hall Jun 11, 1950 [id number]  RN MARY called requesting patient needed A SHRINKER before going home, I called and spoke to CHARGE RN and told her what I read in the notes per MD DUDA where he applied SHRINKER in OR. Rn went and patient has on [facility]   Patient ID: Chad Hall, male   DOB: [birth year], 70 y.o.   MRN: [id number]  Chad Hall 11/18/2020, 1:36 PM

## 2020-11-18 NOTE — Progress Notes (Signed)
[facility]   Reason for visit: Follow up on bacteremia  Interval History: TEE negative for vegetation; remains afebrile. No complaints today.  No associated rash or diarrhea.   Day 8 total antibiotics Day 7 daptomycin  Physical Exam: Constitutional:  Vitals:   11/18/20 0900 11/18/20 0914  BP: (!) 132/50 134/66  Pulse: (!) 59 (!) 57  Resp: (!) 21 14  Temp:  98.1 F (36.7 C)  SpO2: 100% 100%   patient appears in NAD Respiratory: Normal respiratory effort; CTA B Cardiovascular: RRR MS: right and left BKA  Review of Systems: Constitutional: negative for fevers and chills Gastrointestinal: negative for nausea and diarrhea  Lab Results  Component Value Date   WBC 8.5 11/16/2020   HGB 9.4 (L) 11/16/2020   HCT 31.4 (L) 11/16/2020   MCV 93.5 11/16/2020   PLT 514 (H) 11/16/2020    Lab Results  Component Value Date   CREATININE 1.17 11/16/2020   BUN 36 (H) 11/16/2020   NA 139 11/16/2020   K 4.5 11/16/2020   CL 111 11/16/2020   CO2 22 11/16/2020    Lab Results  Component Value Date   ALT 25 11/14/2020   AST 33 11/14/2020   ALKPHOS 49 11/14/2020     Microbiology: Recent Results (from the past 240 hour(s))  Blood culture (routine x 2)     Status: Abnormal   Collection Time: 11/11/20  8:53 AM   Specimen: BLOOD  Result Value Ref Range Status   Specimen Description BLOOD LEFT ANTECUBITAL  Final   Special Requests   Final    BOTTLES DRAWN AEROBIC AND ANAEROBIC Blood Culture adequate volume   Culture  Setup Time   Final    GRAM POSITIVE COCCI ANAEROBIC BOTTLE ONLY CRITICAL RESULT CALLED TO, READ BACK BY AND VERIFIED WITH: PHARMD JAMES LEDFORD 11/12/20@1 :29 BY TW IN BOTH AEROBIC AND ANAEROBIC BOTTLES    Culture (A)  Final    GROUP B STREP(S.AGALACTIAE)ISOLATED METHICILLIN RESISTANT STAPHYLOCOCCUS AUREUS STAPHYLOCOCCUS HAEMOLYTICUS STAPHYLOCOCCUS EPIDERMIDIS THE SIGNIFICANCE OF ISOLATING THIS ORGANISM FROM A SINGLE SET OF BLOOD  CULTURES WHEN MULTIPLE SETS ARE DRAWN IS UNCERTAIN. PLEASE NOTIFY THE MICROBIOLOGY DEPARTMENT WITHIN ONE WEEK IF SPECIATION AND SENSITIVITIES ARE REQUIRED. Performed at [facility], [address] [place], [place] [address]    Report Status 11/14/2020 FINAL  Final   Organism ID, Bacteria GROUP B STREP(S.AGALACTIAE)ISOLATED  Final   Organism ID, Bacteria METHICILLIN RESISTANT STAPHYLOCOCCUS AUREUS  Final      Susceptibility   Group b strep(s.agalactiae)isolated - MIC*    CLINDAMYCIN <=0.25 SENSITIVE Sensitive     AMPICILLIN <=0.25 SENSITIVE Sensitive     ERYTHROMYCIN 2 RESISTANT Resistant     VANCOMYCIN 0.5 SENSITIVE Sensitive     CEFTRIAXONE <=0.12 SENSITIVE Sensitive     LEVOFLOXACIN 1 SENSITIVE Sensitive     * GROUP B STREP(S.AGALACTIAE)ISOLATED   Methicillin resistant staphylococcus aureus - MIC*    CIPROFLOXACIN >=8 RESISTANT Resistant     ERYTHROMYCIN >=8 RESISTANT Resistant     GENTAMICIN <=0.5 SENSITIVE Sensitive     OXACILLIN >=4 RESISTANT Resistant     TETRACYCLINE <=1 SENSITIVE Sensitive     VANCOMYCIN 1 SENSITIVE Sensitive     TRIMETH/SULFA >=320 RESISTANT Resistant     CLINDAMYCIN <=0.25 SENSITIVE Sensitive     RIFAMPIN <=0.5 SENSITIVE Sensitive     Inducible Clindamycin NEGATIVE Sensitive     * METHICILLIN RESISTANT STAPHYLOCOCCUS AUREUS  Blood Culture ID Panel (Reflexed)     Status:  Abnormal   Collection Time: 11/11/20  8:53 AM  Result Value Ref Range Status   Enterococcus faecalis NOT DETECTED NOT DETECTED Final   Enterococcus Faecium NOT DETECTED NOT DETECTED Final   Listeria monocytogenes NOT DETECTED NOT DETECTED Final   Staphylococcus species DETECTED (A) NOT DETECTED Final    Comment: CRITICAL RESULT CALLED TO, READ BACK BY AND VERIFIED WITH: PHARMD JAMES LEDFORD 11/12/20@1 :29 BY TW    Staphylococcus aureus (BCID) DETECTED (A) NOT DETECTED Final    Comment: Methicillin (oxacillin)-resistant Staphylococcus aureus (MRSA). MRSA is predictably resistant to  beta-lactam antibiotics (except ceftaroline). Preferred therapy is vancomycin unless clinically contraindicated. Patient requires contact precautions if  hospitalized. CRITICAL RESULT CALLED TO, READ BACK BY AND VERIFIED WITH: PHARMD JAMES LEDFORD 11/12/20@1 :29 BY TW    Staphylococcus epidermidis DETECTED (A) NOT DETECTED Final    Comment: CRITICAL RESULT CALLED TO, READ BACK BY AND VERIFIED WITH: PHARMD JAMES LEDFORD 11/12/20@1 :29 BY TW    Staphylococcus lugdunensis NOT DETECTED NOT DETECTED Final   Streptococcus species DETECTED (A) NOT DETECTED Final    Comment: CRITICAL RESULT CALLED TO, READ BACK BY AND VERIFIED WITH: PHARMD JAMES LEDFORD 11/12/20@1 :29 BY TW    Streptococcus agalactiae DETECTED (A) NOT DETECTED Final    Comment: CRITICAL RESULT CALLED TO, READ BACK BY AND VERIFIED WITH: PHARMD JAMES LEDFORD 11/12/20@1 :29 BY TW    Streptococcus pneumoniae NOT DETECTED NOT DETECTED Final   Streptococcus pyogenes NOT DETECTED NOT DETECTED Final   A.calcoaceticus-baumannii NOT DETECTED NOT DETECTED Final   Bacteroides fragilis NOT DETECTED NOT DETECTED Final   Enterobacterales NOT DETECTED NOT DETECTED Final   Enterobacter cloacae complex NOT DETECTED NOT DETECTED Final   Escherichia coli NOT DETECTED NOT DETECTED Final   Klebsiella aerogenes NOT DETECTED NOT DETECTED Final   Klebsiella oxytoca NOT DETECTED NOT DETECTED Final   Klebsiella pneumoniae NOT DETECTED NOT DETECTED Final   Proteus species NOT DETECTED NOT DETECTED Final   Salmonella species NOT DETECTED NOT DETECTED Final   Serratia marcescens NOT DETECTED NOT DETECTED Final   Haemophilus influenzae NOT DETECTED NOT DETECTED Final   Neisseria meningitidis NOT DETECTED NOT DETECTED Final   Pseudomonas aeruginosa NOT DETECTED NOT DETECTED Final   Stenotrophomonas maltophilia NOT DETECTED NOT DETECTED Final   Candida albicans NOT DETECTED NOT DETECTED Final   Candida auris NOT DETECTED NOT DETECTED Final   Candida glabrata  NOT DETECTED NOT DETECTED Final   Candida krusei NOT DETECTED NOT DETECTED Final   Candida parapsilosis NOT DETECTED NOT DETECTED Final   Candida tropicalis NOT DETECTED NOT DETECTED Final   Cryptococcus neoformans/gattii NOT DETECTED NOT DETECTED Final   Methicillin resistance mecA/C DETECTED (A) NOT DETECTED Final    Comment: CRITICAL RESULT CALLED TO, READ BACK BY AND VERIFIED WITH: PHAMRD JAMES LEDFORD 11/12/20@1 :29 BY TW    Meth resistant mecA/C and MREJ DETECTED (A) NOT DETECTED Final    Comment: CRITICAL RESULT CALLED TO, READ BACK BY AND VERIFIED WITH: PHARMD JAMES LEDFORD 11/12/20@1 :29 BY TW Performed at [facility], [address], [place], [place] [address]   Blood culture (routine x 2)     Status: None   Collection Time: 11/11/20 10:18 AM   Specimen: BLOOD  Result Value Ref Range Status   Specimen Description BLOOD RIGHT ANTECUBITAL  Final   Special Requests   Final    BOTTLES DRAWN AEROBIC AND ANAEROBIC Blood Culture adequate volume   Culture   Final    NO GROWTH 5 DAYS Performed at [facility],  [address], [place], [place] [address]    Report Status 11/17/2020 FINAL  Final  SARS CORONAVIRUS 2 (TAT 6-24 HRS) Nasopharyngeal Nasopharyngeal Swab     Status: None   Collection Time: 11/11/20 12:55 PM   Specimen: Nasopharyngeal Swab  Result Value Ref Range Status   SARS Coronavirus 2 NEGATIVE NEGATIVE Final    Comment: (NOTE) SARS-CoV-2 target nucleic acids are NOT DETECTED.  The SARS-CoV-2 RNA is generally detectable in upper and lower respiratory specimens during the acute phase of infection. Negative results do not preclude SARS-CoV-2 infection, do not rule out co-infections with other pathogens, and should not be used as the sole basis for treatment or other patient management decisions. Negative results must be combined with clinical observations, patient history, and epidemiological information. The expected result is Negative.  Fact Sheet for  Patients: HairSlick.no  Fact Sheet for Healthcare Providers: quierodirigir.com  This test is not yet approved or cleared by the [place] FDA and  has been authorized for detection and/or diagnosis of SARS-CoV-2 by FDA under an Emergency Use Authorization (EUA). This EUA will remain  in effect (meaning this test can be used) for the duration of the COVID-19 declaration under Se ction 564(b)(1) of the Act, 21 U.S.C. section 360bbb-3(b)(1), unless the authorization is terminated or revoked sooner.  Performed at [facility], [address] [place], [place] [address]   Culture, blood (routine x 2)     Status: None   Collection Time: 11/12/20 10:20 AM   Specimen: BLOOD LEFT HAND  Result Value Ref Range Status   Specimen Description BLOOD LEFT HAND  Final   Special Requests   Final    BOTTLES DRAWN AEROBIC AND ANAEROBIC Blood Culture adequate volume   Culture   Final    NO GROWTH 5 DAYS Performed at [facility], [address] [place], [place] [address]    Report Status 11/17/2020 FINAL  Final  Culture, blood (routine x 2)     Status: None   Collection Time: 11/12/20  3:55 PM   Specimen: BLOOD  Result Value Ref Range Status   Specimen Description BLOOD RIGHT ANTECUBITAL  Final   Special Requests   Final    BOTTLES DRAWN AEROBIC AND ANAEROBIC Blood Culture results may not be optimal due to an excessive volume of blood received in culture bottles   Culture   Final    NO GROWTH 5 DAYS Performed at [facility], [address], [place], [place] [address]    Report Status 11/17/2020 FINAL  Final  Surgical PCR screen     Status: Abnormal   Collection Time: 11/13/20 11:06 AM   Specimen: Nasal Mucosa; Nasal Swab  Result Value Ref Range Status   MRSA, PCR POSITIVE (A) NEGATIVE Final    Comment: RESULT CALLED TO, READ BACK BY AND VERIFIED WITH: RN K.HARVELL AT 1314 ON 11/13/2020 BY T.SAAD.     Staphylococcus aureus POSITIVE (A) NEGATIVE Final    Comment: (NOTE) The Xpert SA Assay (FDA approved for NASAL specimens in patients 23 years of age and older), is one component of a comprehensive surveillance program. It is not intended to diagnose infection nor to guide or monitor treatment. Performed at [facility], [address], [place], [place] [address]     Impression/Plan:  1. Right BKA - infection with multiple organisms in the blood and now s/p definitive treatment with BKA done by Dr. Lajoyce Corners.  He will complete another week of  antibiotics as below to assure clearance.   2.  Bacteremia - multiple organisms and repeat blood cultures have remained negative.  TEE noted and no concerns for valvular vegetation.   Continue with linezolid 600 mg bid for 7 days at discharge He requests getting the linezolid through his mail order pharmacy so that he can get the instructions of the prescription directly to his app that he uses.  Please send there for overnight delivery.    3.  Bernerd Limbo - has resolved with last creat wnl at 1.17 on 10/1.    I will sign off, call with any questions.

## 2020-11-18 NOTE — Anesthesia Postprocedure Evaluation (Signed)
Anesthesia Post Note  Patient: Chad Hall  Procedure(s) Performed: TRANSESOPHAGEAL ECHOCARDIOGRAM (TEE)     Patient location during evaluation: Endoscopy Anesthesia Type: General Level of consciousness: awake Pain management: pain level controlled Vital Signs Assessment: post-procedure vital signs reviewed and stable Respiratory status: spontaneous breathing Cardiovascular status: stable Postop Assessment: no apparent nausea or vomiting Anesthetic complications: no   No notable events documented.  Last Vitals:  Vitals:   11/18/20 0900 11/18/20 0914  BP: (!) 132/50 134/66  Pulse: (!) 59 (!) 57  Resp: (!) 21 14  Temp:  36.7 C  SpO2: 100% 100%    Last Pain:  Vitals:   11/18/20 0914  TempSrc: Oral  PainSc:                  Mabelle Mungin

## 2020-11-18 NOTE — Progress Notes (Signed)
The patient denies any concerns today.  He remains afebrile with stable vital signs.  TEE reviewed and is negative for vegetations. Was previously on Daptomycin from broadspectrum coverage. Now narrowed to Linezolide, given neg TEE - appreciate ID's recommendations. He is stable for home d/c with close outpatient f/u. He will need an orthopedic f/u as well.

## 2020-11-18 NOTE — CV Procedure (Signed)
    TRANSESOPHAGEAL ECHOCARDIOGRAM   NAME:  Chad Hall    MRN: [id number] DOB:  [birth year]    ADMIT DATE: 11/11/2020  INDICATIONS: Bacteremia  PROCEDURE:   Informed consent was obtained prior to the procedure. The risks, benefits and alternatives for the procedure were discussed and the patient comprehended these risks.  Risks include, but are not limited to, cough, sore throat, vomiting, nausea, somnolence, esophageal and stomach trauma or perforation, bleeding, low blood pressure, aspiration, pneumonia, infection, trauma to the teeth and death.    Procedural time out performed. The oropharynx was anesthetized with topical 1% benzocaine.    Anesthesia was administered by Dr. Chilton Si and team.  The patient was administered a total of Propofol 360 mg, Lidocaine 40 mg to achieve and maintain moderate to deep conscious sedation.  [name] heart rate, blood pressure, and oxygen saturation are monitored continuously during the procedure. The period of conscious sedation is 16 minutes, of which I was present face-to-face 100% of this time.   The transesophageal probe was inserted in the esophagus and stomach without difficulty and multiple views were obtained. Three-D acquisitions used to further evaluate for pathology.  COMPLICATIONS:    There were no immediate complications.  KEY FINDINGS:  Moderate Sinus of Valsalva Aneurysm. No evidence of infective endocarditis.  Full report to follow. Further management per primary team.   Riley Lam, MD [place]  [facility]  8:34 AM

## 2020-11-18 NOTE — Discharge Instructions (Addendum)
Dear Chad Hall,   Thank you for letting us participate in your care! In this section, you will find a brief summary of why you were admitted to the hospital, what happened during your admission, your diagnosis/diagnoses, and recommended follow up.  You were admitted for a lower leg infection You were also seen by the orthopedic specialists while you were here who did your amputation surgery  POST-HOSPITAL & CARE INSTRUCTIONS Take your antibiotic (Linezolid) twice daily for the next 7 days Call the orthopedic office (# listed in this paperwork)- you need a follow up appointment within 1 week  Please see medications section of this packet for any medication changes.   DOCTOR'S APPOINTMENTS & FOLLOW UP Future Appointments  Date Time Provider Department Center  11/25/2020  8:30 AM ACCESS TO CARE POOL [id number]     Thank you for choosing [facility]! Take care and be well!  Family Medicine Teaching Service Inpatient Team [place]  [facility]  [address] [place], [place] [address] [phone]

## 2020-11-18 NOTE — Anesthesia Procedure Notes (Signed)
Procedure Name: MAC Date/Time: 11/18/2020 8:01 AM Performed by: Reece Agar, CRNA Pre-anesthesia Checklist: Patient identified, Emergency Drugs available, Suction available, Patient being monitored and Timeout performed Patient Re-evaluated:Patient Re-evaluated prior to induction Oxygen Delivery Method: Nasal cannula

## 2020-11-18 NOTE — Discharge Summary (Addendum)
Family Medicine Teaching [facility] Discharge Summary  Patient name: Chad Hall Medical record number: [id number] Date of birth: [birth year] Age: 70 y.o. Gender: male Date of Admission: 11/11/2020  Date of Discharge: 11/18/2020 Admitting Physician: Nestor Ramp, MD  Primary Care Provider: Evelena Leyden, DO Consultants: Orthopedic surgery  Indication for Hospitalization: Right foot osteomyelitis  Discharge Diagnoses/Problem List:  Right foot osteomyelitis s/p right BKA Type 2 Diabetes Bilateral eye blindness HTN HLD  Disposition: Home (w/home health)  Discharge Condition: Stable  Discharge Exam:   General: Awake, laying in bed, NAD CV: RRR, no murmurs, normal S1/S2 Pulm: CTA B, no wheezing, no crackles, normal work of breathing on room air Abdomen: Soft, no distention, no tenderness Extremities: BKA bilaterally.  Surgical sites on right LE dressed and covered with Ortho device, no signs of drainage. Skin: Warm, dry  Brief Hospital Course:  Chad Hall is a 70 y.o. male who presented with right foot wound and concern for osteomyelitis. PMH is significant for blindness in both eyes, type 2 diabetes, hypertension, hyperlipidemia. His hospital course is detailed below.   Gangrene Right Foot  Patient presented with right foot wound with foot pain that had been going on for 3 months and only recently became more more painful.  Patient previously had a left BKA on 12/28/2018 for osteomyelitis. In the emergency department patient was afebrile with mildly elevated white blood cell count of 12.  Initial lactic acid of 1.3.  CRP elevated at 21.2. Patient with significant AKI with creatinine of 2.16, baseline 1.30-1.45. Of note, maggots were found in wound. Blood cultures were collected and later grew Strep agalactiae, MRSA Staph Haemolyticus, and Staph Epidermidis. Patient was initially started on Vancomycin, Zosyn, Flagyl, and Cefepime.  X-ray showed findings concerning for  osteomyelitis at the base of the fifth metatarsal. MRI of the foot showed dorsal midfoot ulceration, with no evidence of osteomyelitis or abscess. Vascular surgery was consulted and determined that there was too much tissue loss for limb salvage. Orthopedic surgery performed a BKA on 11/13/20. Patient antibiotics were narrowed to Daptomycin. Repeat blood cultures on 9/28 NGTD. Due to concerns for bacteriemia and possible endocarditis a TEE was performed which was normal. He was discharged on Linezolid for an additional 7 days of antibiotics (14 day total abx course).   AKI Initial creatinine of 2.16, recent baseline creatinine of 1.0-1.1.  Likely prerenal from dehydration and infection. Resolved with IV fluid hydration.   All other conditions were chronic and stable   Issues for Follow Up:  Patient's Enalapril was held during admission due to AKI and normal range BPs. Restarted at lower dose (10mg  rather than 20mg ) at discharge. Please re-assess. Jardiance held throughout admission and at discharge. A1c 6.9%. Please restart as necessary. Ensure ortho follow up is arranged (within 1 week).  Significant Procedures: BKA  Significant Labs and Imaging:  Recent Labs  Lab 11/14/20 0135 11/15/20 0319 11/16/20 0242  WBC 11.0* 9.2 8.5  HGB 9.5* 8.9* 9.4*  HCT 31.2* 29.3* 31.4*  PLT 446* 469* 514*   Recent Labs  Lab 11/12/20 0948 11/13/20 0329 11/14/20 0135 11/14/20 0625 11/15/20 0319 11/16/20 0242  NA 136 136 138 138 141 139  K 4.7 4.8 5.5* 5.0 4.5 4.5  CL 106 108 111 110 114* 111  CO2 22 21* 19* 21* 21* 22  GLUCOSE 136* 119* 171* 162* 156* 129*  BUN 34* 36* 40* 41* 41* 36*  CREATININE 1.66* 1.60* 1.43* 1.41* 1.47* 1.17  CALCIUM 8.6* 8.3* 8.4*  8.3* 8.1* 8.2*  ALKPHOS 52 51 49  --   --   --   AST 33 37 33  --   --   --   ALT 30 32 25  --   --   --   ALBUMIN 2.0* 1.8* 1.8*  --   --   --       Results/Tests Pending at Time of Discharge: None  Discharge Medications:  Allergies  as of 11/18/2020   No Known Allergies      Medication List     STOP taking these medications    empagliflozin 10 MG Tabs tablet Commonly known as: Jardiance       TAKE these medications    acetaminophen 325 MG tablet Commonly known as: TYLENOL Take 2 tablets (650 mg total) by mouth every 6 (six) hours as needed for mild pain (or Fever >/= 101).   atorvastatin 80 MG tablet Commonly known as: LIPITOR Take 1 tablet (80 mg total) by mouth daily.   augmented betamethasone dipropionate 0.05 % cream Commonly known as: DIPROLENE-AF APPLY TOPICALLY TWICE DAILY   bisacodyl 5 MG EC tablet Commonly known as: DULCOLAX Take 1 tablet (5 mg total) by mouth daily as needed for moderate constipation.   cholecalciferol 25 MCG (1000 UNIT) tablet Commonly known as: VITAMIN D3 Take 1,000 Units by mouth at bedtime.   enalapril 20 MG tablet Commonly known as: VASOTEC Take 0.5 tablets (10 mg total) by mouth daily. What changed: how much to take   gabapentin 100 MG capsule Commonly known as: NEURONTIN Take 3 capsules (300 mg total) by mouth daily. What changed:  how much to take when to take this additional instructions   linagliptin 5 MG Tabs tablet Commonly known as: Tradjenta Take 1 tablet (5 mg total) by mouth at bedtime.   linezolid 600 MG tablet Commonly known as: ZYVOX Take 1 tablet (600 mg total) by mouth 2 (two) times daily for 7 days.   metoprolol succinate 25 MG 24 hr tablet Commonly known as: TOPROL-XL Take 0.5 tablets (12.5 mg total) by mouth every evening.   Mintox Maximum Strength 400-400-40 MG/5ML suspension Generic drug: alum & mag hydroxide-simeth Take 7.5 -15 mLs by mouth every 2 (two) hours as needed for indigestion.   oxyCODONE 5 MG immediate release tablet Commonly known as: Oxy IR/ROXICODONE Take 1-2 tablets (5-10 mg total) by mouth every 4 (four) hours as needed for up to 5 days for moderate pain (pain score 4-6).   Prodigy No Coding Blood Gluc test  strip Generic drug: glucose blood USE TO CHECK BLOOD SUGAR  THREE TIMES DAILY OR AS  DIRECTED   Prodigy Twist Top Lancets 28G Misc CHECK BLOOD SUGAR 3 TIMES  DAILY   triamcinolone cream 0.1 % Commonly known as: KENALOG APPLY TOPICALLY TWICE DAILY AS NEEDED FOR DRY SKIN What changed: See the new instructions.               Discharge Care Instructions  (From admission, onward)           Start     Ordered   11/18/20 0000  Discharge wound care:       Comments: Follow up with ortho within 1 week   11/18/20 1227            Discharge Instructions: Please refer to Patient Instructions section of EMR for full details.  Patient was counseled important signs and symptoms that should prompt return to medical care, changes in medications, dietary instructions, activity  restrictions, and follow up appointments.   Follow-Up Appointments:  Follow-up Information     Adonis Huguenin, NP Follow up in 1 week(s).   Specialty: Orthopedic Surgery Contact information: [address] [place] [address] [phone]                 Jerre Simon, MD 11/18/2020, 4:12 PM PGY-1, [facility]   FPTS Upper-Level Resident Addendum   I have independently interviewed and examined the patient. I have discussed the above with Dr.Norbert and agree with the documented plan. My edits for correction/addition/clarification are included above. Please see any attending notes.   Maury Dus, MD PGY-2, [facility] 11/20/2020 6:36 AM

## 2020-11-18 NOTE — Interval H&P Note (Signed)
History and Physical Interval Note:  11/18/2020 7:26 AM  Chad Hall  has presented today for surgery, with the diagnosis of BACTEREMIA.  The various methods of treatment have been discussed with the patient and family. After consideration of risks, benefits and other options for treatment, the patient has consented to  Procedure(s): TRANSESOPHAGEAL ECHOCARDIOGRAM (TEE) (N/A) as a surgical intervention.  [name] history has been reviewed, patient examined, no change in status, stable for surgery.  I have reviewed the patient's chart and labs.  Questions were answered to the patient's satisfaction.     Brittinie Wherley A Maghan Jessee

## 2020-11-18 NOTE — Anesthesia Preprocedure Evaluation (Addendum)
Anesthesia Evaluation  Patient identified by MRN, date of birth, ID band Patient awake    Reviewed: Allergy & Precautions, NPO status   Airway Mallampati: II  TM Distance: >3 FB     Dental   Pulmonary former smoker,    breath sounds clear to auscultation       Cardiovascular hypertension, + Peripheral Vascular Disease   Rhythm:Regular Rate:Normal     Neuro/Psych  Neuromuscular disease    GI/Hepatic negative GI ROS, Neg liver ROS,   Endo/Other  diabetes  Renal/GU      Musculoskeletal   Abdominal   Peds  Hematology   Anesthesia Other Findings   Reproductive/Obstetrics                             Anesthesia Physical Anesthesia Plan  ASA: 3  Anesthesia Plan: General   Post-op Pain Management:    Induction: Intravenous  PONV Risk Score and Plan: Treatment may vary due to age or medical condition and Propofol infusion  Airway Management Planned: Mask, Nasal Cannula and Simple Face Mask  Additional Equipment:   Intra-op Plan:   Post-operative Plan:   Informed Consent: I have reviewed the patients History and Physical, chart, labs and discussed the procedure including the risks, benefits and alternatives for the proposed anesthesia with the patient or authorized representative who has indicated his/her understanding and acceptance.     Dental advisory given  Plan Discussed with: Anesthesiologist and CRNA  Anesthesia Plan Comments:         Anesthesia Quick Evaluation

## 2020-11-18 NOTE — TOC Transition Note (Signed)
Transition of Care [facility]) - CM/SW Discharge Note   Patient Details  Name: Chad Hall MRN: [id number] Date of Birth: October 31, 1950  [facility] of [facility]) CM/SW Contact:  Epifanio Lesches, RN Phone Number: 11/18/2020, 3:27 PM   Clinical Narrative:    Patient will DC [facility] Anticipated DC date: 11/18/2020 Family notified:yes Transport by: [facility] [employer]          - s/p R bka  Per MD patient ready for DC today. RN, patient, patient's family, and [facility] notified of DC. Pt states arranged transportation to home with [employer] transportation.  TOC pharmacy to deliver Rx meds to bedside.   Post hospital f/u noted on AVS.  RNCM will sign off for now as intervention is no longer needed. Please consult [place] again if new needs arise.    Final next level of care: Home w Home Health Services Barriers to Discharge: No Barriers Identified   Patient Goals and CMS Choice        Discharge Placement                       Discharge Plan and Services In-house Referral: Financial Counselor (Referral made with Christia Reading ( financial navigator) for Medicaid Screening)                        HH Arranged: RN, PT, OT, Social Work [employer] Agency: [facility] Date [facility] Agency Contacted: 11/13/20 Time HH Agency Contacted: 1451 Representative spoke with at [facility] Agency: Amy  Social Determinants of Health (SDOH) Interventions     Readmission Risk Interventions Readmission Risk Prevention Plan 01/04/2020 12/29/2018  Post Dischage Appt Complete Not Complete  Appt Comments - dispo pending  Medication Screening Complete Complete  Transportation Screening Complete Complete  Some recent data might be hidden

## 2020-11-18 NOTE — Transfer of Care (Signed)
Immediate Anesthesia Transfer of Care Note  Patient: RIVALDO HINEMAN  Procedure(s) Performed: TRANSESOPHAGEAL ECHOCARDIOGRAM (TEE)  Patient Location: Endoscopy Unit  Anesthesia Type:MAC  Level of Consciousness: awake and alert   Airway & Oxygen Therapy: Patient Spontanous Breathing and Patient connected to nasal cannula oxygen  Post-op Assessment: Report given to RN and Post -op Vital signs reviewed and stable  Post vital signs: Reviewed and stable  Last Vitals:  Vitals Value Taken Time  BP 142/66 11/18/20 0844  Temp    Pulse 73 11/18/20 0844  Resp 26 11/18/20 0844  SpO2 99 % 11/18/20 0844  Vitals shown include unvalidated device data.  Last Pain:  Vitals:   11/18/20 0840  TempSrc:   PainSc: 0-No pain      Patients Stated Pain Goal: 3 (71/16/57 9038)  Complications: No notable events documented.

## 2020-11-18 NOTE — Progress Notes (Signed)
PT Cancellation Note  Patient Details Name: Chad Hall MRN: [id number] DOB: 07/20/1950   Cancelled Treatment:    Reason Eval/Treat Not Completed: Patient at procedure or test/unavailable Pt off floor at TEE. Will follow up as time allows.   [name] 11/18/2020, 7:46 AM [name], PT, DPT Acute Rehabilitation Services Pager [phone] Office [phone]

## 2020-11-18 NOTE — Progress Notes (Signed)
FPTS Brief Progress Note  S:Patient alert and awake listening to phone at time of visit. RN had no concerns. Patient had no additional concerns. Patient denies pain at this time.    O: BP 129/63 (BP Location: Left Arm)   Pulse 71   Temp 98.2 F (36.8 C) (Oral)   Resp 20   Ht 6\' 5"  (1.956 m)   Wt 95.3 kg   SpO2 97%   BMI 24.90 kg/m   Gen: male appearing stated age, lying in bed supine  Pulm: normal respiratory effort   A/P: -continued management per AM team orders   , MD 11/18/2020, 4:50 AM PGY-3, [facility] Night Resident  Please page [phone] with questions.

## 2020-11-19 LAB — SURGICAL PATHOLOGY

## 2020-11-20 ENCOUNTER — Encounter (HOSPITAL_COMMUNITY): Payer: Self-pay | Admitting: Internal Medicine

## 2020-11-25 ENCOUNTER — Ambulatory Visit: Payer: Medicare Other | Admitting: Family Medicine

## 2020-11-25 ENCOUNTER — Other Ambulatory Visit: Payer: Self-pay

## 2020-11-25 ENCOUNTER — Telehealth: Payer: Self-pay | Admitting: Orthopedic Surgery

## 2020-11-25 VITALS — BP 144/72 | HR 81

## 2020-11-25 DIAGNOSIS — Z89511 Acquired absence of right leg below knee: Secondary | ICD-10-CM | POA: Insufficient documentation

## 2020-11-25 DIAGNOSIS — I10 Essential (primary) hypertension: Secondary | ICD-10-CM

## 2020-11-25 DIAGNOSIS — Z09 Encounter for follow-up examination after completed treatment for conditions other than malignant neoplasm: Secondary | ICD-10-CM | POA: Diagnosis not present

## 2020-11-25 MED ORDER — ENALAPRIL MALEATE 10 MG PO TABS: 10.0000 mg | ORAL_TABLET | Freq: Every day | ORAL | 3 refills | Status: DC

## 2020-11-25 NOTE — Progress Notes (Signed)
    SUBJECTIVE:   CHIEF COMPLAINT / HPI:   Chad Hall is a 70 yo M who presents for hospital discharge follow up.   S/p Right BKA, prior left BKA Doing well. No concerns today. Denies leg pain. Has ortho follow up tomorrow.   Hypertension - Medications: Enalipril 10 mg daily (taking 20 mg 1/2 tablet), metoprolol 12.5 mg qhs - Compliance: Yes, prefers 10 mg tablets enalipril has been biting the pill and it is bitter - Checking BP at home: Was checking BP at home prior to hospitalization, has not check BP since discharge  PERTINENT  PMH / PSH: DM, Osteomyelitis, gangrene of right foot   OBJECTIVE:   BP (!) 144/72   Pulse 81   SpO2 100%   General: Appears well, no acute distress. Age appropriate. Sitting in wheelchair comfortably.  Cardiac: RRR, normal heart sounds, no murmurs Respiratory: CTAB, normal effort Extremities: Right BKA with clean cover and brace. Left BKA with artificial limb in place.  Neuro: alert and oriented x4  ASSESSMENT/PLAN:  1. Hospital discharge follow-up Admitted for osteomyelitis of the right foot 9/26. Now s/p right BKA. Reviewed hospital course and discharge summary. Patient has follow up with ortho 10/11. Also found to have an AKI that resolved prior to discharge. He has no concerns at this time.   2. S/P BKA (below knee amputation), right (HCC) -Follow up with Ortho scheduled for 10/11  3. Primary hypertension Discussed resuming checking blood pressure at home goal is less than 140/90. Patient desires 10 mg pill instead of biting into a 20 mg pill. New RX sent to pharmacy. Consider increasing if necessary with restored kidney function. - enalapril (VASOTEC) 10 MG tablet; Take 1 tablet (10 mg total) by mouth at bedtime.  Dispense: 90 tablet; Refill: 3 -Follow up with PCP as needed.      Lavonda Jumbo, DO [facility]

## 2020-11-25 NOTE — Patient Instructions (Addendum)
Thank you for coming in today. Today we discussed your blood pressure and sent in 10 mg enalapril.  You can check this at home your goal is less than 140/90. Please keep your follow-up with the orthopedic specialist tomorrow. Thank you for getting your flu shot this year. Follow-up with your primary care doctor in December or sooner if needed.  Dr. Salvadore Dom.

## 2020-11-25 NOTE — Telephone Encounter (Signed)
Called and lm on vm to advise ok for PT orders as requested below. To call with questions.

## 2020-11-25 NOTE — Telephone Encounter (Signed)
[name] (PT) from [facility] called requesting continued home health pt. Chad Hall is requesting home health pt for 2 wk 2 and 1 wk 2. Please call [name] at [phone] and if unable to answer leave a detailed message on secure vm.

## 2020-11-26 ENCOUNTER — Telehealth: Payer: Self-pay | Admitting: Orthopedic Surgery

## 2020-11-26 ENCOUNTER — Ambulatory Visit (INDEPENDENT_AMBULATORY_CARE_PROVIDER_SITE_OTHER): Payer: Medicare Other | Admitting: Family

## 2020-11-26 DIAGNOSIS — Z89511 Acquired absence of right leg below knee: Secondary | ICD-10-CM

## 2020-11-26 NOTE — Progress Notes (Signed)
Post-Op Visit Note   Patient: Chad Hall           Date of Birth: 07-Sep-1950           MRN: [id number] Visit Date: 11/26/2020 PCP: Evelena Leyden, DO  Chief Complaint:  Chief Complaint  Patient presents with   Right Leg - Routine Post Op    11/13/20 right BKA    HPI:  HPI The patient is a 70 year old gentleman seen status post right below-knee amputation 2 weeks ago he is currently in a limb protector his wound VAC was removed today.  Ortho Exam Incision well approximated staples there is scant bloody drainage there is no swelling no purulence no erythema  Visit Diagnoses: No diagnosis found.  Plan: Begin daily Dial soap cleansing.  Dry dressing changes.  Shrinker around-the-clock follow-up in 1 week for staple removal.  Order for his prosthesis on the right faxed to [facility] for him  Follow-Up Instructions: No follow-ups on file.   Imaging: No results found.  Orders:  No orders of the defined types were placed in this encounter.  No orders of the defined types were placed in this encounter.    PMFS History: Patient Active Problem List   Diagnosis Date Noted   S/P BKA (below knee amputation), right (HCC) 11/25/2020   Bacteremia due to Staphylococcus aureus without sepsis 11/17/2020   Polymicrobial bacterial infection 11/12/2020   Gangrene of right foot (HCC)    PVD (peripheral vascular disease) (HCC)    Severe protein-calorie malnutrition (HCC)    Osteomyelitis (HCC) 11/11/2020   Maggot infestation 08/23/2019   Diabetic peripheral neuropathy associated with type 2 diabetes mellitus (HCC) 11/10/2012   Marfan's syndrome 08/08/2012   Atopic dermatitis 02/16/2012   BMI 24.0-24.9, adult 07/05/2011   Allergic rhinitis due to allergen 07/03/2011   Venous insufficiency of leg 04/03/2011   Blindness 07/23/2010   DM (diabetes mellitus), type 2 with neurological complications (HCC) 06/13/2010   Hypertension 06/13/2010   Hyperlipidemia 06/13/2010   Past Medical  History:  Diagnosis Date   Allergy    Blindness - both eyes 1967   Basketball injury   Diabetes mellitus    Hernia    Hyperlipidemia    Hypertension    Left hip pain 04/10/2013   Marfan syndrome    Osteomyelitis of foot, left, acute (HCC) 12/26/2018   Right knee pain 10/04/2017    Family History  Problem Relation Age of Onset   Heart disease Mother    Breast cancer Sister     Past Surgical History:  Procedure Laterality Date   AMPUTATION Left 12/28/2018   Procedure: LEFT FOOT 3RD RAY AMPUTATION;  Surgeon: Nadara Mustard, MD;  Location: [facility] OR;  Service: Orthopedics;  Laterality: Left;   AMPUTATION Left 01/03/2020   Procedure: LEFT BELOW KNEE AMPUTATION;  Surgeon: Nadara Mustard, MD;  Location: [facility] OR;  Service: Orthopedics;  Laterality: Left;   AMPUTATION Right 11/13/2020   Procedure: RIGHT BELOW KNEE AMPUTATION;  Surgeon: Nadara Mustard, MD;  Location: [facility] OR;  Service: Orthopedics;  Laterality: Right;   APPENDECTOMY     INGUINAL HERNIA REPAIR     Right 1970s,    Left 2011 by Dr Job Founds HERNIA REPAIR  08/25/10   recurrent left   PENILE PROSTHESIS IMPLANT  2000s   [facility], Dr Brunilda Payor   RETINAL DETACHMENT SURGERY  1970   Rotator Cuff Repair  2009   Left side, Dr Elita Quick    ROTATOR CUFF REPAIR  2010-2011?   right side   TEE WITHOUT CARDIOVERSION N/A 11/18/2020   Procedure: TRANSESOPHAGEAL ECHOCARDIOGRAM (TEE);  Surgeon: Christell Constant, MD;  Location: [facility] ENDOSCOPY;  Service: Cardiovascular;  Laterality: N/A;   Social History   Occupational History   Occupation: industrial sewing    Employer: [employer]  Tobacco Use   Smoking status: Former    Types: Cigarettes    Quit date: 02/14/1984    Years since quitting: 36.8   Smokeless tobacco: Never  Substance and Sexual Activity   Alcohol use: No    Comment: Quit in 10/1983   Drug use: No   Sexual activity: Not Currently

## 2020-11-26 NOTE — Telephone Encounter (Signed)
Will occupational therapist called requesting verbal orders for ot for pt 2 wk 2. Please call Will at [phone] or leave detailed message on secure line.

## 2020-11-26 NOTE — Telephone Encounter (Signed)
I called and advised verbal ok for orders below.  

## 2020-12-03 ENCOUNTER — Encounter: Payer: Self-pay | Admitting: Family

## 2020-12-03 ENCOUNTER — Ambulatory Visit (INDEPENDENT_AMBULATORY_CARE_PROVIDER_SITE_OTHER): Payer: Medicare Other | Admitting: Family

## 2020-12-03 DIAGNOSIS — Z89511 Acquired absence of right leg below knee: Secondary | ICD-10-CM

## 2020-12-03 DIAGNOSIS — Z89512 Acquired absence of left leg below knee: Secondary | ICD-10-CM

## 2020-12-03 NOTE — Progress Notes (Addendum)
Post-Op Visit Note   Patient: Chad Hall           Date of Birth: 07-Apr-1950           MRN: [id number] Visit Date: 12/03/2020 PCP: Evelena Leyden, DO  Chief Complaint:  Chief Complaint  Patient presents with   Right Leg - Routine Post Op    Right BKA on 11/13/20    HPI:  HPI The patient is a 70 year old gentleman seen status post right below-knee amputation.  His current right left below-knee prosthesis is ill fitting.  He is having to use multiple layers of ply this does not stay on and patient feels unsafe in his current socket.  Patient is a new bilateral transtibial  amputee. The patient is a new right below-knee amputee.  As well as a existing left below-knee amputee  Patient's current comorbidities are not expected to impact the ability to function with the prescribed prosthesis. Patient verbally communicates a strong desire to use a prosthesis. Patient currently requires mobility aids to ambulate without a prosthesis.  Expects not to use mobility aids with a new prosthesis.  Patient is a K2 level ambulator that will use a prosthesis to walk around their home and the community over low level environmental barriers.     Ortho Exam Vive right below-knee amputation incision is well healed staples are in place.  These were harvested today without incident there is no gaping drainage erythema noted warmth  Visit Diagnoses:  1. Right below-knee amputee (HCC)   2. History of left below knee amputation [facility])     Plan: May proceed with prosthesis set up.  He will follow-up in the office in 4 weeks.  Continue shrinker around-the-clock.  Follow-Up Instructions: No follow-ups on file.   Imaging: No results found.  Orders:  No orders of the defined types were placed in this encounter.  No orders of the defined types were placed in this encounter.    PMFS History: Patient Active Problem List   Diagnosis Date Noted   S/P BKA (below knee amputation), right (HCC)  11/25/2020   Bacteremia due to Staphylococcus aureus without sepsis 11/17/2020   Polymicrobial bacterial infection 11/12/2020   Gangrene of right foot (HCC)    PVD (peripheral vascular disease) (HCC)    Severe protein-calorie malnutrition (HCC)    Osteomyelitis (HCC) 11/11/2020   Maggot infestation 08/23/2019   Diabetic peripheral neuropathy associated with type 2 diabetes mellitus (HCC) 11/10/2012   Marfan's syndrome 08/08/2012   Atopic dermatitis 02/16/2012   BMI 24.0-24.9, adult 07/05/2011   Allergic rhinitis due to allergen 07/03/2011   Venous insufficiency of leg 04/03/2011   Blindness 07/23/2010   DM (diabetes mellitus), type 2 with neurological complications (HCC) 06/13/2010   Hypertension 06/13/2010   Hyperlipidemia 06/13/2010   Past Medical History:  Diagnosis Date   Allergy    Blindness - both eyes 1967   Basketball injury   Diabetes mellitus    Hernia    Hyperlipidemia    Hypertension    Left hip pain 04/10/2013   Marfan syndrome    Osteomyelitis of foot, left, acute (HCC) 12/26/2018   Right knee pain 10/04/2017    Family History  Problem Relation Age of Onset   Heart disease Mother    Breast cancer Sister     Past Surgical History:  Procedure Laterality Date   AMPUTATION Left 12/28/2018   Procedure: LEFT FOOT 3RD RAY AMPUTATION;  Surgeon: Nadara Mustard, MD;  Location: [facility] OR;  Service:  Orthopedics;  Laterality: Left;   AMPUTATION Left 01/03/2020   Procedure: LEFT BELOW KNEE AMPUTATION;  Surgeon: Nadara Mustard, MD;  Location: [facility] OR;  Service: Orthopedics;  Laterality: Left;   AMPUTATION Right 11/13/2020   Procedure: RIGHT BELOW KNEE AMPUTATION;  Surgeon: Nadara Mustard, MD;  Location: [facility] OR;  Service: Orthopedics;  Laterality: Right;   APPENDECTOMY     INGUINAL HERNIA REPAIR     Right 1970s,    Left 2011 by Dr Job Founds HERNIA REPAIR  08/25/10   recurrent left   PENILE PROSTHESIS IMPLANT  2000s   [facility], Dr Brunilda Payor   RETINAL DETACHMENT  SURGERY  1970   Rotator Cuff Repair  2009   Left side, Dr Elita Quick    ROTATOR CUFF REPAIR  2010-2011?   right side   TEE WITHOUT CARDIOVERSION N/A 11/18/2020   Procedure: TRANSESOPHAGEAL ECHOCARDIOGRAM (TEE);  Surgeon: Christell Constant, MD;  Location: [facility] ENDOSCOPY;  Service: Cardiovascular;  Laterality: N/A;   Social History   Occupational History   Occupation: industrial sewing    Employer: [employer]  Tobacco Use   Smoking status: Former    Types: Cigarettes    Quit date: 02/14/1984    Years since quitting: 36.8   Smokeless tobacco: Never  Substance and Sexual Activity   Alcohol use: No    Comment: Quit in 10/1983   Drug use: No   Sexual activity: Not Currently

## 2020-12-06 ENCOUNTER — Telehealth: Payer: Self-pay

## 2020-12-06 NOTE — Telephone Encounter (Signed)
This pt does have bilateral BKA the right is a new prosthetic but the left is a previous surgery and he needs a new prosthetic can we make an addendum to his last note for Bilateral K2 prosthetic set up the left is old and not a good fit.

## 2020-12-17 ENCOUNTER — Telehealth: Payer: Self-pay

## 2020-12-17 NOTE — Telephone Encounter (Signed)
Needs rx for bilat K2 prosthetics faxed to [facility].

## 2020-12-17 NOTE — Telephone Encounter (Signed)
done

## 2020-12-18 ENCOUNTER — Telehealth: Payer: Self-pay | Admitting: Orthopedic Surgery

## 2020-12-18 NOTE — Telephone Encounter (Signed)
Arline Asp from [facility] called requesting continued nursing. She state pt had lower knee amputation and pt right lower knee is leaking. She also is asking any other instructions from Dr. Lajoyce Corners for wound care. Cindy phone number is [phone]. If unable to answer leave a detailed vm on secure line

## 2020-12-18 NOTE — Telephone Encounter (Signed)
I called and sw Cindy to give verbal ok for continued nursing she advised that the leg is swollen and weaping with breakdown to the skin.  Advised to apply 4x4,kerlix and ace bandage and change twice daily if needed to keep up with the drainage. Appt made for Friday at 9am for eval with Erin.

## 2020-12-20 ENCOUNTER — Ambulatory Visit (INDEPENDENT_AMBULATORY_CARE_PROVIDER_SITE_OTHER): Payer: Medicare Other | Admitting: Family

## 2020-12-20 DIAGNOSIS — Z89511 Acquired absence of right leg below knee: Secondary | ICD-10-CM

## 2020-12-23 ENCOUNTER — Telehealth: Payer: Self-pay | Admitting: Orthopedic Surgery

## 2020-12-23 NOTE — Telephone Encounter (Signed)
You saw this pt on Friday dictation is pending recent BKA [facility] calling for orders please advise.

## 2020-12-23 NOTE — Telephone Encounter (Signed)
Traci Banker) from [facility] called requesting a call back concerning wound care. Please call Traci back at [phone].

## 2020-12-24 ENCOUNTER — Encounter: Payer: Self-pay | Admitting: Family

## 2020-12-24 NOTE — Telephone Encounter (Signed)
Called and sw Traci to advise of orders below. To call with questions/ concerns and advised pt has follow up on 03/02/20 and can move up if she feels needs to be seen sooner.

## 2020-12-24 NOTE — Progress Notes (Signed)
Post-Op Visit Note   Patient: Chad Hall           Date of Birth: 1950/03/24           MRN: [id number] Visit Date: 12/20/2020 PCP: Evelena Leyden, DO  Chief Complaint:  Chief Complaint  Patient presents with   Right Leg - Wound Check    Right BKA 10/2020    HPI:  HPI Is a 70 year old gentleman seen status post right below-knee amputation September 27.  He is seen today for concern of weeping on his residual limb and this began 4 days ago he does not have any pain but and does endorse itching.  He is been soaking his gauze and Ace wrap  Ortho Exam On examination of the right residual limb the incision is well-healed he does have some mild edema with weeping and dermatitis there is no erythema no warmth no sign of infection  Visit Diagnoses:  1. S/P BKA (below knee amputation), right (HCC)     Plan: He will discontinue the vive shrinker.  May use hydrocortisone and dry dressing with an Ace wrap for compression and have advised him to follow-up with [facility] for another non-wool shrinker.  Follow-Up Instructions: No follow-ups on file.   Imaging: No results found.  Orders:  No orders of the defined types were placed in this encounter.  No orders of the defined types were placed in this encounter.    PMFS History: Patient Active Problem List   Diagnosis Date Noted   S/P BKA (below knee amputation), right (HCC) 11/25/2020   Bacteremia due to Staphylococcus aureus without sepsis 11/17/2020   Polymicrobial bacterial infection 11/12/2020   Gangrene of right foot (HCC)    PVD (peripheral vascular disease) (HCC)    Severe protein-calorie malnutrition (HCC)    Osteomyelitis (HCC) 11/11/2020   Maggot infestation 08/23/2019   Diabetic peripheral neuropathy associated with type 2 diabetes mellitus (HCC) 11/10/2012   Marfan's syndrome 08/08/2012   Atopic dermatitis 02/16/2012   BMI 24.0-24.9, adult 07/05/2011   Allergic rhinitis due to allergen 07/03/2011   Venous  insufficiency of leg 04/03/2011   Blindness 07/23/2010   DM (diabetes mellitus), type 2 with neurological complications (HCC) 06/13/2010   Hypertension 06/13/2010   Hyperlipidemia 06/13/2010   Past Medical History:  Diagnosis Date   Allergy    Blindness - both eyes 1967   Basketball injury   Diabetes mellitus    Hernia    Hyperlipidemia    Hypertension    Left hip pain 04/10/2013   Marfan syndrome    Osteomyelitis of foot, left, acute (HCC) 12/26/2018   Right knee pain 10/04/2017    Family History  Problem Relation Age of Onset   Heart disease Mother    Breast cancer Sister     Past Surgical History:  Procedure Laterality Date   AMPUTATION Left 12/28/2018   Procedure: LEFT FOOT 3RD RAY AMPUTATION;  Surgeon: Nadara Mustard, MD;  Location: [facility] OR;  Service: Orthopedics;  Laterality: Left;   AMPUTATION Left 01/03/2020   Procedure: LEFT BELOW KNEE AMPUTATION;  Surgeon: Nadara Mustard, MD;  Location: [facility] OR;  Service: Orthopedics;  Laterality: Left;   AMPUTATION Right 11/13/2020   Procedure: RIGHT BELOW KNEE AMPUTATION;  Surgeon: Nadara Mustard, MD;  Location: [facility] OR;  Service: Orthopedics;  Laterality: Right;   APPENDECTOMY     INGUINAL HERNIA REPAIR     Right 1970s,    Left 2011 by Dr Job Founds HERNIA REPAIR  08/25/10   recurrent left   PENILE PROSTHESIS IMPLANT  2000s   [facility], Dr Brunilda Payor   RETINAL DETACHMENT SURGERY  1970   Rotator Cuff Repair  2009   Left side, Dr Elita Quick    ROTATOR CUFF REPAIR  2010-2011?   right side   TEE WITHOUT CARDIOVERSION N/A 11/18/2020   Procedure: TRANSESOPHAGEAL ECHOCARDIOGRAM (TEE);  Surgeon: Christell Constant, MD;  Location: [facility] ENDOSCOPY;  Service: Cardiovascular;  Laterality: N/A;   Social History   Occupational History   Occupation: industrial sewing    Employer: [employer]  Tobacco Use   Smoking status: Former    Types: Cigarettes    Quit date: 02/14/1984    Years since quitting: 36.8   Smokeless tobacco:  Never  Substance and Sexual Activity   Alcohol use: No    Comment: Quit in 10/1983   Drug use: No   Sexual activity: Not Currently

## 2020-12-24 NOTE — Telephone Encounter (Signed)
Discussed hydrocortisone and using 4x4 and ace  D/c the wool shrinker at this time  Is to get a new shrinker from [facility]

## 2020-12-25 ENCOUNTER — Other Ambulatory Visit: Payer: Self-pay | Admitting: Family

## 2020-12-25 MED ORDER — DOXYCYCLINE HYCLATE 100 MG PO TABS: 100.0000 mg | ORAL_TABLET | Freq: Two times a day (BID) | ORAL | 0 refills | Status: DC

## 2020-12-30 ENCOUNTER — Other Ambulatory Visit: Payer: Self-pay | Admitting: Family Medicine

## 2020-12-31 ENCOUNTER — Ambulatory Visit (INDEPENDENT_AMBULATORY_CARE_PROVIDER_SITE_OTHER): Payer: Medicare Other | Admitting: Family

## 2020-12-31 DIAGNOSIS — Z89512 Acquired absence of left leg below knee: Secondary | ICD-10-CM

## 2020-12-31 DIAGNOSIS — Z89511 Acquired absence of right leg below knee: Secondary | ICD-10-CM

## 2020-12-31 DIAGNOSIS — L2089 Other atopic dermatitis: Secondary | ICD-10-CM

## 2020-12-31 MED ORDER — EUCERIN EX CREA: TOPICAL_CREAM | Freq: Two times a day (BID) | CUTANEOUS | 3 refills | Status: DC | PRN

## 2021-01-01 ENCOUNTER — Encounter: Payer: Self-pay | Admitting: Family

## 2021-01-01 NOTE — Progress Notes (Signed)
Post-Op Visit Note   Patient: Chad Hall           Date of Birth: 1950/06/15           MRN: [id number] Visit Date: 12/31/2020 PCP: Evelena Leyden, DO  Chief Complaint:  Chief Complaint  Patient presents with   Right Leg - Wound Check, Routine Post Op    Hx right bka    HPI:  HPI The patient is a 70 year old gentleman seen in follow-up.  He is status post right below-knee amputation September 27  He has been having some issues with ulcers swelling redness warmth and itching to his residual limb.  Last week a prescription for antibiotics was called and unfortunately he has not been able to get this he states he has to use his mail order pharmacy and this prescription will arrive today.  He has been using Curlex Ace wrap then his shrinker over top as there was some concern for allergic reaction to the [facility] in the shrinker.  Ortho Exam On examination patient is scratching his hands he has significant atopic dermatitis to bilateral hands from fingertips to the wrists  On examination of the right residual limb the incision is well-healed.  Has mild edema unfortunately has significant dermatitis mild erythema with scant weeping.  No warmth no purulence no odor   Visit Diagnoses:  No diagnosis found.   Plan: As has history of eczema we will place him on a combination cream of Kenalog and Eucerin he will apply this twice daily as well as as needed for irritation continue wearing a layer underneath your shrinker.    Follow-Up Instructions: No follow-ups on file.   Imaging: No results found.  Orders:  No orders of the defined types were placed in this encounter.  Meds ordered this encounter  Medications   triamcinolone 0.1% oint-Eucerin equivalent cream 1:1 mixture    Sig: Apply topically 2 (two) times daily as needed. 1 jar please    Dispense:  10 g    Refill:  3      PMFS History: Patient Active Problem List   Diagnosis Date Noted   S/P BKA (below knee  amputation), right (HCC) 11/25/2020   Bacteremia due to Staphylococcus aureus without sepsis 11/17/2020   Polymicrobial bacterial infection 11/12/2020   Gangrene of right foot (HCC)    PVD (peripheral vascular disease) (HCC)    Severe protein-calorie malnutrition (HCC)    Osteomyelitis (HCC) 11/11/2020   Maggot infestation 08/23/2019   Diabetic peripheral neuropathy associated with type 2 diabetes mellitus (HCC) 11/10/2012   Marfan's syndrome 08/08/2012   Atopic dermatitis 02/16/2012   BMI 24.0-24.9, adult 07/05/2011   Allergic rhinitis due to allergen 07/03/2011   Venous insufficiency of leg 04/03/2011   Blindness 07/23/2010   DM (diabetes mellitus), type 2 with neurological complications (HCC) 06/13/2010   Hypertension 06/13/2010   Hyperlipidemia 06/13/2010   Past Medical History:  Diagnosis Date   Allergy    Blindness - both eyes 1967   Basketball injury   Diabetes mellitus    Hernia    Hyperlipidemia    Hypertension    Left hip pain 04/10/2013   Marfan syndrome    Osteomyelitis of foot, left, acute (HCC) 12/26/2018   Right knee pain 10/04/2017    Family History  Problem Relation Age of Onset   Heart disease Mother    Breast cancer Sister     Past Surgical History:  Procedure Laterality Date   AMPUTATION Left 12/28/2018  Procedure: LEFT FOOT 3RD RAY AMPUTATION;  Surgeon: Nadara Mustard, MD;  Location: [facility] OR;  Service: Orthopedics;  Laterality: Left;   AMPUTATION Left 01/03/2020   Procedure: LEFT BELOW KNEE AMPUTATION;  Surgeon: Nadara Mustard, MD;  Location: [facility] OR;  Service: Orthopedics;  Laterality: Left;   AMPUTATION Right 11/13/2020   Procedure: RIGHT BELOW KNEE AMPUTATION;  Surgeon: Nadara Mustard, MD;  Location: [facility] OR;  Service: Orthopedics;  Laterality: Right;   APPENDECTOMY     INGUINAL HERNIA REPAIR     Right 1970s,    Left 2011 by Dr Job Founds HERNIA REPAIR  08/25/10   recurrent left   PENILE PROSTHESIS IMPLANT  2000s   [facility], Dr Brunilda Payor    RETINAL DETACHMENT SURGERY  1970   Rotator Cuff Repair  2009   Left side, Dr Elita Quick    ROTATOR CUFF REPAIR  2010-2011?   right side   TEE WITHOUT CARDIOVERSION N/A 11/18/2020   Procedure: TRANSESOPHAGEAL ECHOCARDIOGRAM (TEE);  Surgeon: Christell Constant, MD;  Location: [facility] ENDOSCOPY;  Service: Cardiovascular;  Laterality: N/A;   Social History   Occupational History   Occupation: industrial sewing    Employer: [employer]  Tobacco Use   Smoking status: Former    Types: Cigarettes    Quit date: 02/14/1984    Years since quitting: 36.9   Smokeless tobacco: Never  Substance and Sexual Activity   Alcohol use: No    Comment: Quit in 10/1983   Drug use: No   Sexual activity: Not Currently

## 2021-01-13 ENCOUNTER — Other Ambulatory Visit: Payer: Self-pay | Admitting: Family Medicine

## 2021-01-14 ENCOUNTER — Telehealth: Payer: Self-pay

## 2021-01-14 ENCOUNTER — Encounter: Payer: Self-pay | Admitting: Family

## 2021-01-14 ENCOUNTER — Other Ambulatory Visit: Payer: Self-pay

## 2021-01-14 ENCOUNTER — Ambulatory Visit (INDEPENDENT_AMBULATORY_CARE_PROVIDER_SITE_OTHER): Payer: Medicare Other | Admitting: Family

## 2021-01-14 DIAGNOSIS — Z89511 Acquired absence of right leg below knee: Secondary | ICD-10-CM

## 2021-01-14 NOTE — Progress Notes (Signed)
Post-Op Visit Note   Patient: Chad Hall           Date of Birth: 04-26-1950           MRN: [id number] Visit Date: 01/14/2021 PCP: Evelena Leyden, DO  Chief Complaint:  Chief Complaint  Patient presents with   Right Leg - Wound Check, Follow-up    Hx right bka 11/12/20    HPI:  HPI The patient is a 70 year old gentleman seen in follow-up.  He is status post right below-knee amputation September 27 of this year and has had issues with atopic dermatitis on his residual limb.  He has been using Kenalog and Eucerin on the residual limb for the last week twice daily he states this initially did resolve but unfortunately just yesterday began weeping again and is quite itchy Ortho Exam On examination of the right residual limb his incision is well-healed the limb is well consolidated unfortunately he does have dry flaking skin with weeping serous drainage and abrasions from scratching.  There is no erythema no warmth  Visit Diagnoses: No diagnosis found.  Plan: Have called in a prescription for clobetasol.  He will use this at bedtime discussed the importance of moisturizing with cream as shrinker with direct skin contact he will follow-up in the office if this fails to improve  He is also requesting that his gait training be done by [facility] for physical therapy  Follow-Up Instructions: Return in about 3 months (around 04/15/2021).   Imaging: No results found.  Orders:  No orders of the defined types were placed in this encounter.  No orders of the defined types were placed in this encounter.    PMFS History: Patient Active Problem List   Diagnosis Date Noted   S/P BKA (below knee amputation), right (HCC) 11/25/2020   Bacteremia due to Staphylococcus aureus without sepsis 11/17/2020   Polymicrobial bacterial infection 11/12/2020   Gangrene of right foot (HCC)    PVD (peripheral vascular disease) (HCC)    Severe protein-calorie malnutrition (HCC)     Osteomyelitis (HCC) 11/11/2020   Maggot infestation 08/23/2019   Diabetic peripheral neuropathy associated with type 2 diabetes mellitus (HCC) 11/10/2012   Marfan's syndrome 08/08/2012   Atopic dermatitis 02/16/2012   BMI 24.0-24.9, adult 07/05/2011   Allergic rhinitis due to allergen 07/03/2011   Venous insufficiency of leg 04/03/2011   Blindness 07/23/2010   DM (diabetes mellitus), type 2 with neurological complications (HCC) 06/13/2010   Hypertension 06/13/2010   Hyperlipidemia 06/13/2010   Past Medical History:  Diagnosis Date   Allergy    Blindness - both eyes 1967   Basketball injury   Diabetes mellitus    Hernia    Hyperlipidemia    Hypertension    Left hip pain 04/10/2013   Marfan syndrome    Osteomyelitis of foot, left, acute (HCC) 12/26/2018   Right knee pain 10/04/2017    Family History  Problem Relation Age of Onset   Heart disease Mother    Breast cancer Sister     Past Surgical History:  Procedure Laterality Date   AMPUTATION Left 12/28/2018   Procedure: LEFT FOOT 3RD RAY AMPUTATION;  Surgeon: Nadara Mustard, MD;  Location: [facility] OR;  Service: Orthopedics;  Laterality: Left;   AMPUTATION Left 01/03/2020   Procedure: LEFT BELOW KNEE AMPUTATION;  Surgeon: Nadara Mustard, MD;  Location: [facility] OR;  Service: Orthopedics;  Laterality: Left;   AMPUTATION Right 11/13/2020   Procedure: RIGHT BELOW KNEE AMPUTATION;  Surgeon:  Nadara Mustard, MD;  Location: [facility] OR;  Service: Orthopedics;  Laterality: Right;   APPENDECTOMY     INGUINAL HERNIA REPAIR     Right 1970s,    Left 2011 by Dr Job Founds HERNIA REPAIR  08/25/10   recurrent left   PENILE PROSTHESIS IMPLANT  2000s   [facility], Dr Brunilda Payor   RETINAL DETACHMENT SURGERY  1970   Rotator Cuff Repair  2009   Left side, Dr Elita Quick    ROTATOR CUFF REPAIR  2010-2011?   right side   TEE WITHOUT CARDIOVERSION N/A 11/18/2020   Procedure: TRANSESOPHAGEAL ECHOCARDIOGRAM (TEE);  Surgeon: Christell Constant, MD;   Location: [facility] ENDOSCOPY;  Service: Cardiovascular;  Laterality: N/A;   Social History   Occupational History   Occupation: industrial sewing    Employer: [employer]  Tobacco Use   Smoking status: Former    Types: Cigarettes    Quit date: 02/14/1984    Years since quitting: 36.9   Smokeless tobacco: Never  Substance and Sexual Activity   Alcohol use: No    Comment: Quit in 10/1983   Drug use: No   Sexual activity: Not Currently

## 2021-01-14 NOTE — Telephone Encounter (Signed)
I called Chad Hall [phone] advised that we would like to have pt eval and treat for physical therapy working on upper body strengthening until his prosthetic is fabricated and then can work on Investment banker, operational. Faxed with demo sheet to call with questions. Faxed to [phone]

## 2021-01-17 ENCOUNTER — Telehealth: Payer: Self-pay | Admitting: Family

## 2021-01-17 NOTE — Telephone Encounter (Signed)
Can you please call pt to advise of message of message below.

## 2021-01-17 NOTE — Telephone Encounter (Signed)
Pt did confirm he wanted to wait until his prosthetic.

## 2021-01-17 NOTE — Telephone Encounter (Signed)
[facility]) calling on behalf of pt. Pt states he wants to wait until after prosthesis to begin therapy. The best call back number for her if needed is [phone].

## 2021-03-05 ENCOUNTER — Other Ambulatory Visit: Payer: Self-pay | Admitting: Family Medicine

## 2021-03-06 MED ORDER — PRODIGY TWIST TOP LANCETS 28G MISC: 3 refills | Status: DC

## 2021-03-07 ENCOUNTER — Other Ambulatory Visit: Payer: Self-pay

## 2021-03-07 ENCOUNTER — Telehealth: Payer: Self-pay

## 2021-03-07 NOTE — Telephone Encounter (Signed)
Order faxed to [phone] for prosthetic gait training right BKA 11/13/2020

## 2021-03-07 NOTE — Telephone Encounter (Signed)
Lattie Haw called stating that patient now has his prostetic and would like to follow through with PT if we can just put the order in so she call pull it.  She stated we can call or text her when its ready

## 2021-03-10 NOTE — Telephone Encounter (Signed)
Done faxed to [phone]

## 2021-03-10 NOTE — Telephone Encounter (Signed)
Please refax letter sent to [facility] on Friday. They did not receive it.

## 2021-03-11 ENCOUNTER — Telehealth: Payer: Self-pay

## 2021-03-11 DIAGNOSIS — Z89511 Acquired absence of right leg below knee: Secondary | ICD-10-CM

## 2021-03-11 NOTE — Telephone Encounter (Signed)
[facility] is wanting orders to be put in epic for physical therapy gait training BKA. Can you do this? Will not accept it under correspondence

## 2021-03-11 NOTE — Telephone Encounter (Signed)
Order needs to be in epic for prosthetic gait training.

## 2021-03-12 ENCOUNTER — Other Ambulatory Visit: Payer: Self-pay | Admitting: Orthopedic Surgery

## 2021-03-12 NOTE — Addendum Note (Signed)
Addended by: Barnie Del R on: 03/12/2021 10:56 AM   Modules accepted: Orders

## 2021-03-12 NOTE — Telephone Encounter (Signed)
Can you please call home health and let them know that Chad Hall put order in epic for HHPT prosthetic gait training. Thanks!

## 2021-03-12 NOTE — Telephone Encounter (Signed)
Notified [facility]

## 2021-03-12 NOTE — Telephone Encounter (Signed)
Done

## 2021-03-17 ENCOUNTER — Telehealth: Payer: Self-pay

## 2021-03-17 DIAGNOSIS — Z89511 Acquired absence of right leg below knee: Secondary | ICD-10-CM

## 2021-03-17 NOTE — Telephone Encounter (Signed)
Nino Glow is calling from [facility] regarding patient she would like orders for  Twice a week for three weeks  1 week 1 for reassessment for prosthetic training

## 2021-03-17 NOTE — Telephone Encounter (Signed)
Can you please place this order in epic for pt. Thanks!

## 2021-03-18 NOTE — Telephone Encounter (Signed)
Done, let me know if need to do anything else

## 2021-04-11 ENCOUNTER — Telehealth: Payer: Self-pay | Admitting: Orthopedic Surgery

## 2021-04-11 NOTE — Telephone Encounter (Signed)
Chad Hall informed okay for nursing eval. She says the area is quarter sized with slough. She also said he has an apt with [place] on 04/15/21

## 2021-04-11 NOTE — Telephone Encounter (Signed)
[facility] called. Requesting orders for a nurse to come out and evaluate his wound on his right interior leg.   CB [phone]

## 2021-04-15 ENCOUNTER — Other Ambulatory Visit: Payer: Self-pay

## 2021-04-15 ENCOUNTER — Ambulatory Visit: Payer: Medicare Other | Admitting: Family

## 2021-04-15 ENCOUNTER — Encounter: Payer: Self-pay | Admitting: Family

## 2021-04-15 DIAGNOSIS — Z89512 Acquired absence of left leg below knee: Secondary | ICD-10-CM | POA: Diagnosis not present

## 2021-04-15 DIAGNOSIS — S81001A Unspecified open wound, right knee, initial encounter: Secondary | ICD-10-CM | POA: Diagnosis not present

## 2021-04-15 DIAGNOSIS — Z89511 Acquired absence of right leg below knee: Secondary | ICD-10-CM

## 2021-04-16 NOTE — Progress Notes (Signed)
Office Visit Note   Patient: Chad Hall           Date of Birth: [birth year]           MRN: [id number] Visit Date: 04/15/2021              Requested by: Evelena Leyden, DO [address] [place],  [place] [address] PCP: Evelena Leyden, DO  Chief Complaint  Patient presents with   Right Leg - Follow-up      HPI: The patient is a 71 year old gentleman who presents today for evaluation of ulcer to his right knee.  He is status post bilateral below-knee amputation.  He states he has had a wound that was initially traumatic over his kneecap for many months now he is concerned that his liner might be making things worse. Has been using Neosporin daily.  Denies drainage fever or chills  Assessment & Plan: Visit Diagnoses: No diagnosis found.  Plan: Mupirocin dressing applied.  He will continue with daily Dial soap cleansing and antibacterial ointment dressing changes.  Follow-Up Instructions: No follow-ups on file.   Ortho Exam  Patient is alert, oriented, no adenopathy, well-dressed, normal affect, normal respiratory effort. On examination of the right knee there is a 15 mm in diameter ulceration over the patella this appears to be 30% healed from its original size.  There is no tunneling 20% fibrinous exudative tissue this is 2 mm deep there is no surrounding maceration no odor no sign of infection  Imaging: No results found. No images are attached to the encounter.  Labs: Lab Results  Component Value Date   HGBA1C 6.9 (H) 11/12/2020   HGBA1C 5.3 01/01/2020   HGBA1C 5.4 09/26/2019   ESRSEDRATE 108 (H) 12/21/2019   CRP 21.2 (H) 11/11/2020   CRP 35.5 (H) 12/21/2019   REPTSTATUS 11/17/2020 FINAL 11/12/2020   GRAMSTAIN NO WBC SEEN RARE GRAM POSITIVE COCCI  12/08/2019   CULT  11/12/2020    NO GROWTH 5 DAYS Performed at [facility], [address] [place], [place] [address]    LABORGA GROUP B STREP(S.AGALACTIAE)ISOLATED 11/11/2020   LABORGA METHICILLIN  RESISTANT STAPHYLOCOCCUS AUREUS 11/11/2020     Lab Results  Component Value Date   ALBUMIN 1.8 (L) 11/14/2020   ALBUMIN 1.8 (L) 11/13/2020   ALBUMIN 2.0 (L) 11/12/2020    No results found for: MG No results found for: VD25OH  No results found for: PREALBUMIN CBC EXTENDED Latest Ref Rng & Units 11/16/2020 11/15/2020 11/14/2020  WBC 4.0 - 10.5 K/uL 8.5 9.2 11.0(H)  RBC 4.22 - 5.81 MIL/uL 3.36(L) 3.18(L) 3.41(L)  HGB 13.0 - 17.0 g/dL 2.5(E) 8.9(L) 9.5(L)  HCT 39.0 - 52.0 % 31.4(L) 29.3(L) 31.2(L)  PLT 150 - 400 K/uL 514(H) 469(H) 446(H)  NEUTROABS 1.7 - 7.7 K/uL - - -  LYMPHSABS 0.7 - 4.0 K/uL - - -     There is no height or weight on file to calculate BMI.  Orders:  No orders of the defined types were placed in this encounter.  No orders of the defined types were placed in this encounter.    Procedures: No procedures performed  Clinical Data: No additional findings.  ROS:  All other systems negative, except as noted in the HPI. Review of Systems  Objective: Vital Signs: There were no vitals taken for this visit.  Specialty Comments:  No specialty comments available.  PMFS History: Patient Active Problem List   Diagnosis Date Noted   S/P BKA (below knee  amputation), right (HCC) 11/25/2020   Bacteremia due to Staphylococcus aureus without sepsis 11/17/2020   Polymicrobial bacterial infection 11/12/2020   Gangrene of right foot (HCC)    PVD (peripheral vascular disease) (HCC)    Severe protein-calorie malnutrition (HCC)    Osteomyelitis (HCC) 11/11/2020   Maggot infestation 08/23/2019   Diabetic peripheral neuropathy associated with type 2 diabetes mellitus (HCC) 11/10/2012   Marfan's syndrome 08/08/2012   Atopic dermatitis 02/16/2012   BMI 24.0-24.9, adult 07/05/2011   Allergic rhinitis due to allergen 07/03/2011   Venous insufficiency of leg 04/03/2011   Blindness 07/23/2010   DM (diabetes mellitus), type 2 with neurological complications (HCC)  06/13/2010   Hypertension 06/13/2010   Hyperlipidemia 06/13/2010   Past Medical History:  Diagnosis Date   Allergy    Blindness - both eyes 1967   Basketball injury   Diabetes mellitus    Hernia    Hyperlipidemia    Hypertension    Left hip pain 04/10/2013   Marfan syndrome    Osteomyelitis of foot, left, acute (HCC) 12/26/2018   Right knee pain 10/04/2017    Family History  Problem Relation Age of Onset   Heart disease Mother    Breast cancer Sister     Past Surgical History:  Procedure Laterality Date   AMPUTATION Left 12/28/2018   Procedure: LEFT FOOT 3RD RAY AMPUTATION;  Surgeon: Nadara Mustard, MD;  Location: [facility] OR;  Service: Orthopedics;  Laterality: Left;   AMPUTATION Left 01/03/2020   Procedure: LEFT BELOW KNEE AMPUTATION;  Surgeon: Nadara Mustard, MD;  Location: [facility] OR;  Service: Orthopedics;  Laterality: Left;   AMPUTATION Right 11/13/2020   Procedure: RIGHT BELOW KNEE AMPUTATION;  Surgeon: Nadara Mustard, MD;  Location: [facility] OR;  Service: Orthopedics;  Laterality: Right;   APPENDECTOMY     INGUINAL HERNIA REPAIR     Right 1970s,    Left 2011 by Dr Job Founds HERNIA REPAIR  08/25/10   recurrent left   PENILE PROSTHESIS IMPLANT  2000s   [facility], Dr Brunilda Payor   RETINAL DETACHMENT SURGERY  1970   Rotator Cuff Repair  2009   Left side, Dr Elita Quick    ROTATOR CUFF REPAIR  2010-2011?   right side   TEE WITHOUT CARDIOVERSION N/A 11/18/2020   Procedure: TRANSESOPHAGEAL ECHOCARDIOGRAM (TEE);  Surgeon: Christell Constant, MD;  Location: [facility] ENDOSCOPY;  Service: Cardiovascular;  Laterality: N/A;   Social History   Occupational History   Occupation: industrial sewing    Employer: [employer]  Tobacco Use   Smoking status: Former    Types: Cigarettes    Quit date: 02/14/1984    Years since quitting: 37.1   Smokeless tobacco: Never  Substance and Sexual Activity   Alcohol use: No    Comment: Quit in 10/1983   Drug use: No   Sexual activity: Not  Currently

## 2021-04-17 ENCOUNTER — Ambulatory Visit: Payer: Medicare Other

## 2021-04-17 ENCOUNTER — Ambulatory Visit (INDEPENDENT_AMBULATORY_CARE_PROVIDER_SITE_OTHER): Payer: Medicare Other | Admitting: Family Medicine

## 2021-04-17 ENCOUNTER — Encounter: Payer: Self-pay | Admitting: Family Medicine

## 2021-04-17 ENCOUNTER — Other Ambulatory Visit: Payer: Self-pay

## 2021-04-17 VITALS — BP 127/70 | HR 75

## 2021-04-17 DIAGNOSIS — Z23 Encounter for immunization: Secondary | ICD-10-CM | POA: Diagnosis not present

## 2021-04-17 DIAGNOSIS — I10 Essential (primary) hypertension: Secondary | ICD-10-CM | POA: Diagnosis not present

## 2021-04-17 DIAGNOSIS — D649 Anemia, unspecified: Secondary | ICD-10-CM

## 2021-04-17 DIAGNOSIS — E1142 Type 2 diabetes mellitus with diabetic polyneuropathy: Secondary | ICD-10-CM | POA: Diagnosis not present

## 2021-04-17 DIAGNOSIS — E1149 Type 2 diabetes mellitus with other diabetic neurological complication: Secondary | ICD-10-CM

## 2021-04-17 MED ORDER — TRIAMCINOLONE ACETONIDE 0.1 % EX CREA: TOPICAL_CREAM | CUTANEOUS | 0 refills | Status: DC

## 2021-04-17 MED ORDER — ATORVASTATIN CALCIUM 80 MG PO TABS: 80.0000 mg | ORAL_TABLET | Freq: Every day | ORAL | 3 refills | Status: DC

## 2021-04-17 MED ORDER — GABAPENTIN 300 MG PO CAPS: 300.0000 mg | ORAL_CAPSULE | Freq: Every day | ORAL | 3 refills | Status: DC

## 2021-04-17 MED ORDER — METOPROLOL SUCCINATE ER 25 MG PO TB24: 12.5000 mg | ORAL_TABLET | Freq: Every evening | ORAL | 3 refills | Status: DC

## 2021-04-17 MED ORDER — LINAGLIPTIN 5 MG PO TABS: 5.0000 mg | ORAL_TABLET | Freq: Every day | ORAL | 3 refills | Status: DC

## 2021-04-17 MED ORDER — VITAMIN D 25 MCG (1000 UNIT) PO TABS: 1000.0000 [IU] | ORAL_TABLET | Freq: Every day | ORAL | 3 refills | Status: AC

## 2021-04-17 MED ORDER — ENALAPRIL MALEATE 10 MG PO TABS: 10.0000 mg | ORAL_TABLET | Freq: Every day | ORAL | 3 refills | Status: DC

## 2021-04-17 MED ORDER — BETAMETHASONE DIPROPIONATE AUG 0.05 % EX CREA: TOPICAL_CREAM | Freq: Two times a day (BID) | CUTANEOUS | 2 refills | Status: DC

## 2021-04-17 NOTE — Patient Instructions (Addendum)
It was so great seeing you today! Today we discussed the following: ? ?-Your medications have been refilled, please let me know if you have any issues. ? ?-We are going to get labs today including checking your anemia, kidney function, diabetes.  Depending on these results, we may decide if we need to restart your Jardiance or not. ? ? ?Please make sure to bring any medications you take to your appointments. If you have any questions or concerns please call the office at [phone].  I will call you with your lab results. ? ?

## 2021-04-17 NOTE — Assessment & Plan Note (Signed)
BP within goal at 127/70. ?- Refilled enalapril and metoprolol ?- BMP collected ?

## 2021-04-17 NOTE — Progress Notes (Signed)
? ? ?  SUBJECTIVE:  ? ?CHIEF COMPLAINT / HPI:  ? ?Patient reports that he is in need of all of his medications being refilled. ? ?He also notes that he has a wound on his right knee that he thinks is being irritated by the lining from his prosthesis.  He recently saw Ortho on 2/28 and is getting set up with wound care, who is to evaluate him sometime next week. ? ?His only other concern at this time is that when he was in the hospital back in September 2022 his Vania Rea was discontinued at that time and it was not added back yet, he would like to know if he needs medication. ? ?Right knee wound, recently saw Ortho.  He is getting set up with wound care to come evaluate him next week ? ?PERTINENT  PMH / PSH: Reviewed ? ?OBJECTIVE:  ? ?BP 127/70   Pulse 75   SpO2 98%   ?Gen: well-appearing, NAD ?CV: RRR, no m/r/g appreciated, no peripheral edema ?Pulm: CTAB, no wheezes/crackles ?Extremities: Bilateral BKA ?Skin: 1.5 cm ulceration over the patella  as pictured below ? ? ? ?ASSESSMENT/PLAN:  ? ?DM (diabetes mellitus), type 2 with neurological complications (HCC) ?- [id number] collected with labs today ?- Refill Tradjenta 5 mg ?- Consider resuming Jardiance if A1c results are above goal ?- Refilled atorvastatin ? ?Diabetic peripheral neuropathy associated with type 2 diabetes mellitus ([place]) ?Gabapentin refilled ? ?Hypertension ?BP within goal at 127/70. ?- Refilled enalapril and metoprolol ?- BMP collected ?  ?History of anemia requiring a transfusion ?- CBC today ? ?Right knee wound ?Appears to be well-healing, following with Ortho. ?- Following with Ortho, continuing their instructions ?- Wound care following up outpatient. ? ?Refills ?Med reconciliation was completed today and all home medications were reordered ? ? ?Azarie Coriz, DO ?[place]  ?

## 2021-04-17 NOTE — Assessment & Plan Note (Signed)
Gabapentin refilled

## 2021-04-17 NOTE — Assessment & Plan Note (Addendum)
-   A1c collected with labs today ?- Refill Tradjenta 5 mg ?- Consider resuming Jardiance if A1c results are above goal ?- Refilled atorvastatin ?- A1c in 3 months ?

## 2021-04-18 ENCOUNTER — Ambulatory Visit (INDEPENDENT_AMBULATORY_CARE_PROVIDER_SITE_OTHER): Payer: Medicare Other

## 2021-04-18 ENCOUNTER — Telehealth: Payer: Self-pay | Admitting: Family Medicine

## 2021-04-18 DIAGNOSIS — E875 Hyperkalemia: Secondary | ICD-10-CM

## 2021-04-18 DIAGNOSIS — Z23 Encounter for immunization: Secondary | ICD-10-CM

## 2021-04-18 LAB — CBC
Hematocrit: 37.2 % — ABNORMAL LOW (ref 37.5–51.0)
Hemoglobin: 12.1 g/dL — ABNORMAL LOW (ref 13.0–17.7)
MCH: 29.2 pg (ref 26.6–33.0)
MCHC: 32.5 g/dL (ref 31.5–35.7)
MCV: 90 fL (ref 79–97)
Platelets: 266 10*3/uL (ref 150–450)
RBC: 4.14 x10E6/uL (ref 4.14–5.80)
RDW: 10.8 % — ABNORMAL LOW (ref 11.6–15.4)
WBC: 4.6 10*3/uL (ref 3.4–10.8)

## 2021-04-18 LAB — BASIC METABOLIC PANEL
BUN/Creatinine Ratio: 21 (ref 10–24)
BUN: 32 mg/dL — ABNORMAL HIGH (ref 8–27)
CO2: 25 mmol/L (ref 20–29)
Calcium: 10.4 mg/dL — ABNORMAL HIGH (ref 8.6–10.2)
Chloride: 106 mmol/L (ref 96–106)
Creatinine, Ser: 1.51 mg/dL — ABNORMAL HIGH (ref 0.76–1.27)
Glucose: 195 mg/dL — ABNORMAL HIGH (ref 70–99)
Potassium: 5.7 mmol/L — ABNORMAL HIGH (ref 3.5–5.2)
Sodium: 150 mmol/L — ABNORMAL HIGH (ref 134–144)
eGFR: 49 mL/min/{1.73_m2} — ABNORMAL LOW (ref >59)

## 2021-04-18 LAB — HEMOGLOBIN A1C
Est. average glucose Bld gHb Est-mCnc: 166 mg/dL
Hgb A1c MFr Bld: 7.4 % — ABNORMAL HIGH (ref 4.8–5.6)

## 2021-04-18 NOTE — Telephone Encounter (Signed)
Call patient to discuss the results of his recent lab work.  Kidney function looks like it has acutely dipped with an elevated creatinine, increased sodium and increased potassium.  Some of the lab work does appear to be signs of dehydration, patient is asymptomatic at this time. ? ?Hyperkalemia ?Unsure if the increase potassium is a true lab value, but we will treat it as if it is.  Patient instructed to stop taking the enalapril, is going to come to clinic for lab draw on either 3/6 or 3/7.  If the repeat BMP is still elevated then we will need to consider evaluation and Lokelma.  If all of the labs improved, can consider we adding back enalapril and scheduling back in clinic with me the following week. ? ?Diabetes ?A1c was elevated at 7.4, realistically the patient could have a goal A1c around 7.5.  Can consider readding back home Jardiance once kidney function improves. ? ? ?Chad Beaulieu, DO  ?

## 2021-04-21 ENCOUNTER — Other Ambulatory Visit: Payer: Medicare Other

## 2021-04-21 ENCOUNTER — Other Ambulatory Visit: Payer: Self-pay

## 2021-04-21 DIAGNOSIS — E875 Hyperkalemia: Secondary | ICD-10-CM

## 2021-04-22 ENCOUNTER — Other Ambulatory Visit: Payer: Self-pay | Admitting: Family Medicine

## 2021-04-22 DIAGNOSIS — E875 Hyperkalemia: Secondary | ICD-10-CM

## 2021-04-22 LAB — BASIC METABOLIC PANEL
BUN/Creatinine Ratio: 21 (ref 10–24)
BUN: 31 mg/dL — ABNORMAL HIGH (ref 8–27)
CO2: 23 mmol/L (ref 20–29)
Calcium: 9.9 mg/dL (ref 8.6–10.2)
Chloride: 108 mmol/L — ABNORMAL HIGH (ref 96–106)
Creatinine, Ser: 1.49 mg/dL — ABNORMAL HIGH (ref 0.76–1.27)
Glucose: 156 mg/dL — ABNORMAL HIGH (ref 70–99)
Potassium: 5.5 mmol/L — ABNORMAL HIGH (ref 3.5–5.2)
Sodium: 143 mmol/L (ref 134–144)
eGFR: 50 mL/min/{1.73_m2} — ABNORMAL LOW (ref >59)

## 2021-04-29 ENCOUNTER — Other Ambulatory Visit: Payer: Medicare Other

## 2021-05-02 ENCOUNTER — Ambulatory Visit: Payer: Medicare Other | Admitting: Family Medicine

## 2021-05-02 ENCOUNTER — Other Ambulatory Visit: Payer: Self-pay

## 2021-05-02 ENCOUNTER — Encounter: Payer: Self-pay | Admitting: Family Medicine

## 2021-05-02 VITALS — BP 125/70 | HR 87

## 2021-05-02 DIAGNOSIS — E1149 Type 2 diabetes mellitus with other diabetic neurological complication: Secondary | ICD-10-CM | POA: Diagnosis not present

## 2021-05-02 DIAGNOSIS — R944 Abnormal results of kidney function studies: Secondary | ICD-10-CM

## 2021-05-02 DIAGNOSIS — E875 Hyperkalemia: Secondary | ICD-10-CM | POA: Diagnosis not present

## 2021-05-02 DIAGNOSIS — I10 Essential (primary) hypertension: Secondary | ICD-10-CM | POA: Diagnosis not present

## 2021-05-02 NOTE — Assessment & Plan Note (Addendum)
Rechecking BMP to monitor potassium level.  If potassium remains stable or within normal limits then we will restart the enalapril.  If it is worsening we will hold and consider giving Lokelma.  May need to consider referral to nephrology. ?- BMP today ?

## 2021-05-02 NOTE — Progress Notes (Signed)
? ? ?  SUBJECTIVE:  ? ?CHIEF COMPLAINT / HPI:  ? ?Patient ports she is doing well and has no complaints.  He initially was wondering if he can get CBG monitoring as he checks his fasting sugars and he does not like being stuck every single day.  His fastings range from 80-1 50 and he does not have any symptoms of hypo or hyperglycemia. ? ?PERTINENT  PMH / PSH: Reviewed ? ?OBJECTIVE:  ? ?BP 125/70   Pulse 87   SpO2 99%   ?Gen: well-appearing, NAD ?CV: RRR, no m/r/g appreciated, no peripheral edema ?Pulm: CTAB, no wheezes/crackles ? ?ASSESSMENT/PLAN:  ? ?Hypertension ?Rechecking BMP to monitor potassium level.  If potassium remains stable or within normal limits then we will restart the enalapril.  If it is worsening we will hold and consider giving Lokelma.  May need to consider referral to nephrology. ?- BMP today ? ?DM (diabetes mellitus), type 2 with neurological complications (HCC) ?Given last A1c with reasonable control of 7.4.  We will continue to hold Jardiance.  Discussed with patient to discontinue his blood sugar checks unless he feels symptomatic. ?  ?Hyperkalemia ?Review potassium on last BMP was still mildly elevated at 5.5.  Continuing to hold enalapril at this time and I will recheck the BMP. ? ?Kidney impairment ?Last GFR was 50 with a creatinine of 1.49.  Patient's last labs collected in September showed impairment them as well but was also during hospitalization.  We will continue to trend as patient likely has progressed in kidney disease. ? ? ?Kattia Selley, DO ?[facility]  ?

## 2021-05-02 NOTE — Patient Instructions (Addendum)
You can stop checking your blood sugars at home unless you feel like you are having symptoms of low or too high blood sugar. ? ?For your kidneys, we are going to recheck that lab today.  I will call you with the results and if they are okay we may consider restarting the medication.  If these levels stay persistent we may also consider talking with a nephrologist. ?

## 2021-05-02 NOTE — Assessment & Plan Note (Signed)
Given last A1c with reasonable control of 7.4.  We will continue to hold Jardiance.  Discussed with patient to discontinue his blood sugar checks unless he feels symptomatic. ?

## 2021-05-03 LAB — BASIC METABOLIC PANEL
BUN/Creatinine Ratio: 21 (ref 10–24)
BUN: 26 mg/dL (ref 8–27)
CO2: 28 mmol/L (ref 20–29)
Calcium: 10.5 mg/dL — ABNORMAL HIGH (ref 8.6–10.2)
Chloride: 106 mmol/L (ref 96–106)
Creatinine, Ser: 1.24 mg/dL (ref 0.76–1.27)
Glucose: 164 mg/dL — ABNORMAL HIGH (ref 70–99)
Potassium: 4.9 mmol/L (ref 3.5–5.2)
Sodium: 143 mmol/L (ref 134–144)
eGFR: 62 mL/min/{1.73_m2} (ref >59)

## 2021-05-05 ENCOUNTER — Telehealth: Payer: Self-pay | Admitting: Orthopedic Surgery

## 2021-05-05 NOTE — Telephone Encounter (Signed)
Sandy from [facility] is calling to report that the wound measurements have increased  ? ?04-30-21 ?1.5 by 1 by 0.1 ? ?05-05-21 ?3 by 2.5 by 0.2 ?

## 2021-05-06 NOTE — Telephone Encounter (Signed)
Pt coming for eval 05/13/21 ?

## 2021-05-13 ENCOUNTER — Encounter: Payer: Self-pay | Admitting: Family

## 2021-05-13 ENCOUNTER — Ambulatory Visit (INDEPENDENT_AMBULATORY_CARE_PROVIDER_SITE_OTHER): Payer: Medicare Other | Admitting: Family

## 2021-05-13 DIAGNOSIS — S81001A Unspecified open wound, right knee, initial encounter: Secondary | ICD-10-CM | POA: Diagnosis not present

## 2021-05-13 DIAGNOSIS — Z89511 Acquired absence of right leg below knee: Secondary | ICD-10-CM

## 2021-05-13 DIAGNOSIS — Z89512 Acquired absence of left leg below knee: Secondary | ICD-10-CM | POA: Diagnosis not present

## 2021-05-13 NOTE — Progress Notes (Signed)
? ?Office Visit Note ?  ?Patient: Chad Hall           ?Date of Birth: 1950/04/20           ?MRN: [id number] ?Visit Date: 05/13/2021 ?             ?Requested by: Evelena Leyden, DO ?[address] ?[place],  [place] [address] ?PCP: Evelena Leyden, DO ? ?Chief Complaint  ?Patient presents with  ? Right Leg - Wound Check  ?  Hx bilateral BKA  ? ? ? ? ?HPI: ?The patient is a 71 year old gentleman who presents today in follow-up for ulcer right knee.  This has been gradually worsening.  Unfortunately he has a larger ulcer today despite home health assistance with wound care as well as a new liner for his prosthesis.  ? ?Also has now developed a small similar ulcer over his left kneecap ? ?Has been using Neosporin daily. ? ?Denies drainage fever or chills ? ?Assessment & Plan: ?Visit Diagnoses: No diagnosis found. ? ?Plan: Mupirocin dressing applied.  He will continue with daily Dial soap cleansing and antibacterial ointment dressing changes.  Provided a doughnut to wear underneath his prosthesis liner ? ?Discussed the case with his prosthetists we will trial a slide sheath for this to reduce pulling from liner. ? ?Follow-Up Instructions: No follow-ups on file.  ? ?Ortho Exam ? ?Patient is alert, oriented, no adenopathy, well-dressed, normal affect, normal respiratory effort. ?On examination of the right knee there is a 20 mm in diameter ulceration over the patella. Epibole to edges. No surrounding erythema, no surrounding maceration no odor no sign of infection ? ?Imaging: ?No results found. ?No images are attached to the encounter. ? ?Labs: ?Lab Results  ?Component Value Date  ? HGBA1C 7.4 (H) 04/17/2021  ? HGBA1C 6.9 (H) 11/12/2020  ? HGBA1C 5.3 01/01/2020  ? ESRSEDRATE 108 (H) 12/21/2019  ? CRP 21.2 (H) 11/11/2020  ? CRP 35.5 (H) 12/21/2019  ? REPTSTATUS 11/17/2020 FINAL 11/12/2020  ? GRAMSTAIN NO WBC SEEN ?RARE GRAM POSITIVE COCCI ? 12/08/2019  ? CULT  11/12/2020  ?  NO GROWTH 5 DAYS ?Performed at [facility], [address] [place], [place] [address] ?  ? LABORGA GROUP B STREP(S.AGALACTIAE)ISOLATED 11/11/2020  ? LABORGA METHICILLIN RESISTANT STAPHYLOCOCCUS AUREUS 11/11/2020  ? ? ? ?Lab Results  ?Component Value Date  ? ALBUMIN 1.8 (L) 11/14/2020  ? ALBUMIN 1.8 (L) 11/13/2020  ? ALBUMIN 2.0 (L) 11/12/2020  ? ? ?No results found for: MG ?No results found for: VD25OH ? ?No results found for: PREALBUMIN ? ?  Latest Ref Rng & Units 04/17/2021  ?  3:17 PM 11/16/2020  ?  2:42 AM 11/15/2020  ?  3:19 AM  ?CBC EXTENDED  ?WBC 3.4 - 10.8 x10E3/uL 4.6   8.5   9.2    ?RBC 4.14 - 5.80 x10E6/uL 4.14   3.36   3.18    ?Hemoglobin 13.0 - 17.7 g/dL 81.1   9.4   8.9    ?HCT 37.5 - 51.0 % 37.2   31.4   29.3    ?Platelets 150 - 450 x10E3/uL 266   514   469    ? ? ? ?There is no height or weight on file to calculate BMI. ? ?Orders:  ?No orders of the defined types were placed in this encounter. ? ?No orders of the defined types were placed in this encounter. ? ? ? Procedures: ?No procedures performed ? ?Clinical Data: ?No additional findings. ? ?  ROS: ? ?All other systems negative, except as noted in the HPI. ?Review of Systems  ?Constitutional:  Negative for chills and fever.  ?Skin:  Positive for wound. Negative for color change.  ? ?Objective: ?Vital Signs: There were no vitals taken for this visit. ? ?Specialty Comments:  ?No specialty comments available. ? ?PMFS History: ?Patient Active Problem List  ? Diagnosis Date Noted  ? S/P BKA (below knee amputation), right (HCC) 11/25/2020  ? PVD (peripheral vascular disease) (HCC)   ? Severe protein-calorie malnutrition (HCC)   ? Osteomyelitis (HCC) 11/11/2020  ? Diabetic peripheral neuropathy associated with type 2 diabetes mellitus (HCC) 11/10/2012  ? Marfan's syndrome 08/08/2012  ? Atopic dermatitis 02/16/2012  ? BMI 24.0-24.9, adult 07/05/2011  ? Allergic rhinitis due to allergen 07/03/2011  ? Venous insufficiency of leg 04/03/2011  ? Blindness 07/23/2010  ? DM (diabetes mellitus), type 2  with neurological complications (HCC) 06/13/2010  ? Hypertension 06/13/2010  ? Hyperlipidemia 06/13/2010  ? ?Past Medical History:  ?Diagnosis Date  ? Allergy   ? Bacteremia due to Staphylococcus aureus without sepsis 11/17/2020  ? Blindness - both eyes 1967  ? Basketball injury  ? Diabetes mellitus   ? Gangrene of right foot (HCC)   ? Hernia   ? Hyperlipidemia   ? Hypertension   ? Left hip pain 04/10/2013  ? Maggot infestation 08/23/2019  ? Marfan syndrome   ? Osteomyelitis of foot, left, acute (HCC) 12/26/2018  ? Polymicrobial bacterial infection 11/12/2020  ? Right knee pain 10/04/2017  ?  ?Family History  ?Problem Relation Age of Onset  ? Heart disease Mother   ? Breast cancer Sister   ?  ?Past Surgical History:  ?Procedure Laterality Date  ? AMPUTATION Left 12/28/2018  ? Procedure: LEFT FOOT 3RD RAY AMPUTATION;  Surgeon: Nadara Mustard, MD;  Location: [facility] OR;  Service: Orthopedics;  Laterality: Left;  ? AMPUTATION Left 01/03/2020  ? Procedure: LEFT BELOW KNEE AMPUTATION;  Surgeon: Nadara Mustard, MD;  Location: [facility] OR;  Service: Orthopedics;  Laterality: Left;  ? AMPUTATION Right 11/13/2020  ? Procedure: RIGHT BELOW KNEE AMPUTATION;  Surgeon: Nadara Mustard, MD;  Location: [facility] OR;  Service: Orthopedics;  Laterality: Right;  ? APPENDECTOMY    ? INGUINAL HERNIA REPAIR    ? Right 1970s,    Left 2011 by Dr Andrey Campanile  ? INGUINAL HERNIA REPAIR  08/25/10  ? recurrent left  ? PENILE PROSTHESIS IMPLANT  2000s  ? [facility], Dr Brunilda Payor  ? RETINAL DETACHMENT SURGERY  1970  ? Rotator Cuff Repair  2009  ? Left side, Dr Elita Quick   ? ROTATOR CUFF REPAIR  2010-2011?  ? right side  ? TEE WITHOUT CARDIOVERSION N/A 11/18/2020  ? Procedure: TRANSESOPHAGEAL ECHOCARDIOGRAM (TEE);  Surgeon: Christell Constant, MD;  Location: [facility] ENDOSCOPY;  Service: Cardiovascular;  Laterality: N/A;  ? ?Social History  ? ?Occupational History  ? Occupation: industrial sewing  ?  Employer: [employer]  ?Tobacco Use  ? Smoking status: Former  ?  Types:  Cigarettes  ?  Quit date: 02/14/1984  ?  Years since quitting: 37.2  ? Smokeless tobacco: Never  ?Substance and Sexual Activity  ? Alcohol use: No  ?  Comment: Quit in 10/1983  ? Drug use: No  ? Sexual activity: Not Currently  ? ? ? ? ? ?

## 2021-05-28 ENCOUNTER — Other Ambulatory Visit: Payer: Self-pay | Admitting: Family Medicine

## 2021-06-03 ENCOUNTER — Encounter: Payer: Self-pay | Admitting: Family

## 2021-06-03 ENCOUNTER — Ambulatory Visit (INDEPENDENT_AMBULATORY_CARE_PROVIDER_SITE_OTHER): Payer: Medicare Other | Admitting: Family

## 2021-06-03 DIAGNOSIS — S81001A Unspecified open wound, right knee, initial encounter: Secondary | ICD-10-CM

## 2021-06-03 DIAGNOSIS — Z89512 Acquired absence of left leg below knee: Secondary | ICD-10-CM

## 2021-06-03 DIAGNOSIS — Z89511 Acquired absence of right leg below knee: Secondary | ICD-10-CM

## 2021-06-03 NOTE — Progress Notes (Signed)
? ?Office Visit Note ?  ?Patient: Chad Hall           ?Date of Birth: 01-02-1951           ?MRN: [id number] ?Visit Date: 06/03/2021 ?             ?Requested by: Evelena LeydenLilland, Alana, DO ?[address] ?[place],  [place] [id number] ?PCP: Evelena LeydenLilland, Alana, DO ? ?Chief Complaint  ?Patient presents with  ? Right Leg - Wound Check  ? Left Leg - Wound Check  ? ? ? ? ?HPI: ?The patient is a 71 year old gentleman who presents today in follow-up for ulcer right knee.  This has been gradually worsening.  Unfortunately he has a larger ulcer today despite home health assistance with wound care as well as a new liner for his prosthesis.  ? ?Also has now developed a small similar ulcer over his left kneecap ? ?Denies drainage fever or chills ? ?Given a slide shealth by Thayer [facility]mhris from [facility] at last visit.  However he states he did not like it this did not work well for him he discontinued use.  He did however trim a old liner down so that this does not come above the area of the ulcer since that time he feels he has had much improvement in his wound ?Assessment & Plan: ?Visit Diagnoses: No diagnosis found. ? ?Plan: He will continue with his current therapy.  Pleased with healing of his ulcer.  There is no concerning sign he will follow-up as needed ? ? ?Follow-Up Instructions: No follow-ups on file.  ? ?Ortho Exam ? ?Patient is alert, oriented, no adenopathy, well-dressed, normal affect, normal respiratory effort. ?On examination of the right knee there is a 5 mm in diameter area of eschar there is no surrounding erythema no drainage overall this is much improved is healing well.  Over the left knee there is no ulcer now.  This the left knee is healed.   ? ?Imaging: ?No results found. ?No images are attached to the encounter. ? ?Labs: ?Lab Results  ?Component Value Date  ? HGBA1C 7.4 (H) 04/17/2021  ? HGBA1C 6.9 (H) 11/12/2020  ? HGBA1C 5.3 01/01/2020  ? ESRSEDRATE 108 (H) 12/21/2019  ? CRP 21.2 (H) 11/11/2020  ? CRP 35.5 (H) 12/21/2019   ? REPTSTATUS 11/17/2020 FINAL 11/12/2020  ? GRAMSTAIN NO WBC SEEN ?RARE GRAM POSITIVE COCCI ? 12/08/2019  ? CULT  11/12/2020  ?  NO GROWTH 5 DAYS ?Performed at [facility], [address] [place], [place] [address] ?  ? LABORGA GROUP B STREP(S.AGALACTIAE)ISOLATED 11/11/2020  ? LABORGA METHICILLIN RESISTANT STAPHYLOCOCCUS AUREUS 11/11/2020  ? ? ? ?Lab Results  ?Component Value Date  ? ALBUMIN 1.8 (L) 11/14/2020  ? ALBUMIN 1.8 (L) 11/13/2020  ? ALBUMIN 2.0 (L) 11/12/2020  ? ? ?No results found for: MG ?No results found for: VD25OH ? ?No results found for: PREALBUMIN ? ?  Latest Ref Rng & Units 04/17/2021  ?  3:17 PM 11/16/2020  ?  2:42 AM 11/15/2020  ?  3:19 AM  ?CBC EXTENDED  ?WBC 3.4 - 10.8 x10E3/uL 4.6   8.5   9.2    ?RBC 4.14 - 5.80 x10E6/uL 4.14   3.36   3.18    ?Hemoglobin 13.0 - 17.7 g/dL 25.312.1   9.4   8.9    ?HCT 37.5 - 51.0 % 37.2   31.4   29.3    ?Platelets 150 - 450 x10E3/uL 266   514   469    ? ? ? ?  There is no height or weight on file to calculate BMI. ? ?Orders:  ?No orders of the defined types were placed in this encounter. ? ?No orders of the defined types were placed in this encounter. ? ? ? Procedures: ?No procedures performed ? ?Clinical Data: ?No additional findings. ? ?ROS: ? ?All other systems negative, except as noted in the HPI. ?Review of Systems  ?Constitutional:  Negative for chills and fever.  ?Skin:  Positive for wound. Negative for color change.  ? ?Objective: ?Vital Signs: There were no vitals taken for this visit. ? ?Specialty Comments:  ?No specialty comments available. ? ?PMFS History: ?Patient Active Problem List  ? Diagnosis Date Noted  ? S/P BKA (below knee amputation), right (HCC) 11/25/2020  ? PVD (peripheral vascular disease) (HCC)   ? Severe protein-calorie malnutrition (HCC)   ? Osteomyelitis (HCC) 11/11/2020  ? Diabetic peripheral neuropathy associated with type 2 diabetes mellitus (HCC) 11/10/2012  ? Marfan's syndrome 08/08/2012  ? Atopic dermatitis 02/16/2012  ? BMI  24.0-24.9, adult 07/05/2011  ? Allergic rhinitis due to allergen 07/03/2011  ? Venous insufficiency of leg 04/03/2011  ? Blindness 07/23/2010  ? DM (diabetes mellitus), type 2 with neurological complications (HCC) 06/13/2010  ? Hypertension 06/13/2010  ? Hyperlipidemia 06/13/2010  ? ?Past Medical History:  ?Diagnosis Date  ? Allergy   ? Bacteremia due to Staphylococcus aureus without sepsis 11/17/2020  ? Blindness - both eyes 1967  ? Basketball injury  ? Diabetes mellitus   ? Gangrene of right foot (HCC)   ? Hernia   ? Hyperlipidemia   ? Hypertension   ? Left hip pain 04/10/2013  ? Maggot infestation 08/23/2019  ? Marfan syndrome   ? Osteomyelitis of foot, left, acute (HCC) 12/26/2018  ? Polymicrobial bacterial infection 11/12/2020  ? Right knee pain 10/04/2017  ?  ?Family History  ?Problem Relation Age of Onset  ? Heart disease Mother   ? Breast cancer Sister   ?  ?Past Surgical History:  ?Procedure Laterality Date  ? AMPUTATION Left 12/28/2018  ? Procedure: LEFT FOOT 3RD RAY AMPUTATION;  Surgeon: Nadara Mustard, MD;  Location: [facility] OR;  Service: Orthopedics;  Laterality: Left;  ? AMPUTATION Left 01/03/2020  ? Procedure: LEFT BELOW KNEE AMPUTATION;  Surgeon: Nadara Mustard, MD;  Location: [facility] OR;  Service: Orthopedics;  Laterality: Left;  ? AMPUTATION Right 11/13/2020  ? Procedure: RIGHT BELOW KNEE AMPUTATION;  Surgeon: Nadara Mustard, MD;  Location: [facility] OR;  Service: Orthopedics;  Laterality: Right;  ? APPENDECTOMY    ? INGUINAL HERNIA REPAIR    ? Right 1970s,    Left 2011 by Dr Andrey Campanile  ? INGUINAL HERNIA REPAIR  08/25/10  ? recurrent left  ? PENILE PROSTHESIS IMPLANT  2000s  ? [facility], Dr Brunilda Payor  ? RETINAL DETACHMENT SURGERY  1970  ? Rotator Cuff Repair  2009  ? Left side, Dr Elita Quick   ? ROTATOR CUFF REPAIR  2010-2011?  ? right side  ? TEE WITHOUT CARDIOVERSION N/A 11/18/2020  ? Procedure: TRANSESOPHAGEAL ECHOCARDIOGRAM (TEE);  Surgeon: Christell Constant, MD;  Location: [facility] ENDOSCOPY;  Service: Cardiovascular;   Laterality: N/A;  ? ?Social History  ? ?Occupational History  ? Occupation: industrial sewing  ?  Employer: [employer]  ?Tobacco Use  ? Smoking status: Former  ?  Types: Cigarettes  ?  Quit date: 02/14/1984  ?  Years since quitting: 37.3  ? Smokeless tobacco: Never  ?Substance and Sexual Activity  ? Alcohol use: No  ?  Comment: Quit in 10/1983  ? Drug use: No  ? Sexual activity: Not Currently  ? ? ? ? ? ?

## 2021-07-22 ENCOUNTER — Encounter: Payer: Self-pay | Admitting: *Deleted

## 2021-08-04 ENCOUNTER — Other Ambulatory Visit: Payer: Self-pay

## 2021-08-04 MED ORDER — PRODIGY NO CODING BLOOD GLUC VI STRP: ORAL_STRIP | 3 refills | Status: DC

## 2021-08-06 MED ORDER — PRODIGY NO CODING BLOOD GLUC VI STRP: ORAL_STRIP | 3 refills | Status: DC

## 2021-08-06 NOTE — Addendum Note (Signed)
Addended by: Steva Colder on: 08/06/2021 02:00 PM   Modules accepted: Orders

## 2021-08-06 NOTE — Telephone Encounter (Signed)
Patient calls nurse line reporting testing supplies were sent to the wrong pharmacy.   Testing supplies need to be sent to [facility].   Supplies resent.

## 2021-12-26 ENCOUNTER — Other Ambulatory Visit: Payer: Self-pay | Admitting: Family Medicine

## 2021-12-31 ENCOUNTER — Other Ambulatory Visit: Payer: Self-pay | Admitting: Family Medicine

## 2021-12-31 DIAGNOSIS — I10 Essential (primary) hypertension: Secondary | ICD-10-CM

## 2022-02-26 ENCOUNTER — Other Ambulatory Visit: Payer: Self-pay | Admitting: Family Medicine

## 2022-03-05 ENCOUNTER — Ambulatory Visit: Payer: Medicare Other | Admitting: Family Medicine

## 2022-03-05 ENCOUNTER — Encounter: Payer: Self-pay | Admitting: Family Medicine

## 2022-03-05 VITALS — BP 160/78 | HR 82 | Wt 215.0 lb

## 2022-03-05 DIAGNOSIS — Z23 Encounter for immunization: Secondary | ICD-10-CM

## 2022-03-05 DIAGNOSIS — H543 Unqualified visual loss, both eyes: Secondary | ICD-10-CM | POA: Diagnosis not present

## 2022-03-05 DIAGNOSIS — E1149 Type 2 diabetes mellitus with other diabetic neurological complication: Secondary | ICD-10-CM | POA: Diagnosis not present

## 2022-03-05 DIAGNOSIS — I10 Essential (primary) hypertension: Secondary | ICD-10-CM | POA: Diagnosis not present

## 2022-03-05 NOTE — Patient Instructions (Signed)
I will work on filling out the [employer] paperwork for your wife. Let me know if you have any issues with it after we fax it over.   Your blood pressure was a bit elevated in the office today, just make sure to keep an eye on it at home. I want you to come back and see me in 1 month to check on your diabetes and blood pressure for your physical.

## 2022-03-05 NOTE — Progress Notes (Signed)
    SUBJECTIVE:   CHIEF COMPLAINT / HPI:   Paperwork - Patient presents to given paperwork for FMLA for his wife - she needs to have the paperwork in case he is ill as he relies on her for assistance  Diabetes and HTN - Patient wants to have blood work done at next visit - states his BP is elevated because of all the fuss to get to his appointment   PERTINENT  PMH / PSH: Blindness, Right BKA  OBJECTIVE:   BP (!) 160/78   Pulse 82   Wt 215 lb (97.5 kg)   SpO2 99%   BMI 25.50 kg/m   General: NAD, well-appearing, well-nourished Respiratory: No respiratory distress, breathing comfortably, able to speak in full sentences Skin: warm and dry, no rashes noted on exposed skin Psych: Appropriate affect and mood  ASSESSMENT/PLAN:   Hypertension BP elevated on exam today, reports that it is well controlled at home. Patient will defer labs to his physical that we will do in the next few weeks. - BMP at next visit - Monitor BP at home and we will discuss at physical  DM (diabetes mellitus), type 2 with neurological complications [facility]) Patient holding off on labs until physical at next visit.  - Continue current medication regimen - A1c at next visit - Check CBG at home if symptomatic for hypo or hyperglycemia   Paperwork Will complete FMLA paperwork and fax once completed.   Rise Patience, [place]

## 2022-03-05 NOTE — Assessment & Plan Note (Signed)
BP elevated on exam today, reports that it is well controlled at home. Patient will defer labs to his physical that we will do in the next few weeks. - BMP at next visit - Monitor BP at home and we will discuss at physical

## 2022-03-05 NOTE — Assessment & Plan Note (Signed)
Patient holding off on labs until physical at next visit.  - Continue current medication regimen - A1c at next visit - Check CBG at home if symptomatic for hypo or hyperglycemia

## 2022-03-31 ENCOUNTER — Other Ambulatory Visit: Payer: Self-pay | Admitting: Family Medicine

## 2022-04-03 ENCOUNTER — Encounter: Payer: Self-pay | Admitting: Family Medicine

## 2022-04-03 ENCOUNTER — Ambulatory Visit (INDEPENDENT_AMBULATORY_CARE_PROVIDER_SITE_OTHER): Payer: Medicare Other | Admitting: Family Medicine

## 2022-04-03 VITALS — BP 158/73 | HR 92 | Ht 77.0 in | Wt 208.0 lb

## 2022-04-03 DIAGNOSIS — E1149 Type 2 diabetes mellitus with other diabetic neurological complication: Secondary | ICD-10-CM | POA: Diagnosis not present

## 2022-04-03 DIAGNOSIS — I10 Essential (primary) hypertension: Secondary | ICD-10-CM | POA: Diagnosis not present

## 2022-04-03 LAB — POCT GLYCOSYLATED HEMOGLOBIN (HGB A1C): HbA1c, POC (controlled diabetic range): 12.8 % — AB (ref 0.0–7.0)

## 2022-04-03 MED ORDER — RYBELSUS 3 MG PO TABS: 3.0000 mg | ORAL_TABLET | Freq: Every day | ORAL | 3 refills | Status: DC

## 2022-04-03 NOTE — Assessment & Plan Note (Signed)
BP elevated on exam today.  Patient is compliant with home enalapril 10 mg, reports home measurements are better than the clinic once. - BMP at next visit, patient unable to state to get this drawn - Urine microalbumin/creatinine ratio next visit

## 2022-04-03 NOTE — Assessment & Plan Note (Addendum)
A1c today jumped to 12.8.  No obvious infectious source that could be contributing to elevations.  Patient does feel like his A1c was going to be elevated but not necessarily this much.  Given patient's visual impairment (as well as his wife's visual impairment) no one is able to administer insulin or injections at this time.  Given the significant elevations, does limit our medication options as Jardiance would likely dehydrate the patient and metformin would not be enough. - Rybelsus 3 mg daily - Follow-up within 2 weeks with pharmacy team - Check daily CBGs - Consider if patient will be qualified for CGM - Discontinue Tradjenta at next visit if patient was able to get Rybelsus - Lipid panel next visit - Urine microalbumin at next visit

## 2022-04-03 NOTE — Progress Notes (Signed)
    SUBJECTIVE:   CHIEF COMPLAINT / HPI:   Healthcare check-up: Patient with no acute concerns, is largely up-to-date on cancer screenings.  Patient had limited time left for the visit, still preferred to defer labs.  T2DM - Checking BG at home: no - Medications: tradjenta 2m daily - Compliance: good  - eye exam: N/a - foot exam: N/A - denies symptoms of hypoglycemia, polyuria, polydipsia, numbness extremities, foot ulcers/trauma   PERTINENT  PMH / PSH: Reviewed  OBJECTIVE:   BP (!) 158/73   Pulse 92   Ht 6' 5"$  (1.956 m)   Wt 208 lb (94.3 kg)   SpO2 100%   BMI 24.67 kg/m   General: NAD, well-appearing, well-nourished, in wheelchair Respiratory: No respiratory distress, breathing comfortably, able to speak in full sentences Skin: warm and dry, no rashes noted on exposed skin Psych: Appropriate affect and mood  ASSESSMENT/PLAN:   DM (diabetes mellitus), type 2 with neurological complications (HCC) [id number] jumped to 12.8.  No obvious infectious source that could be contributing to elevations.  Patient does feel like his A1c was going to be elevated but not necessarily this much.  Given patient's visual impairment (as well as his wife's visual impairment) no one is able to administer insulin or injections at this time.  Given the significant elevations, does limit our medication options as Jardiance would likely dehydrate the patient and metformin would not be enough. - Rybelsus 3 mg daily - Follow-up within 2 weeks with pharmacy team - Check daily CBGs - Consider if patient will be qualified for CGM - Discontinue Tradjenta at next visit if patient was able to get Rybelsus - Lipid panel next visit - Urine microalbumin at next visit  Hypertension BP elevated on exam today.  Patient is compliant with home enalapril 10 mg, reports home measurements are better than the clinic once. - BMP at next visit, patient unable to state to get this drawn - Urine  microalbumin/creatinine ratio next visit     ARise Patience [place]ch[place]

## 2022-04-03 NOTE — Patient Instructions (Addendum)
Make an appointment with Dr. Valentina Lucks in the next 10-14 days for your diabetes. We are going to try and get a medication called Rybelsus to help control your diabetes.

## 2022-04-07 ENCOUNTER — Other Ambulatory Visit: Payer: Self-pay

## 2022-04-07 MED ORDER — RYBELSUS 3 MG PO TABS: 3.0000 mg | ORAL_TABLET | Freq: Every day | ORAL | 3 refills | Status: DC

## 2022-04-14 ENCOUNTER — Encounter: Payer: Self-pay | Admitting: Pharmacist

## 2022-04-14 ENCOUNTER — Ambulatory Visit: Payer: Medicare Other | Admitting: Pharmacist

## 2022-04-14 VITALS — BP 160/73 | HR 61 | Wt 199.8 lb

## 2022-04-14 DIAGNOSIS — E1149 Type 2 diabetes mellitus with other diabetic neurological complication: Secondary | ICD-10-CM

## 2022-04-14 DIAGNOSIS — I10 Essential (primary) hypertension: Secondary | ICD-10-CM

## 2022-04-14 LAB — POCT GLYCOSYLATED HEMOGLOBIN (HGB A1C): HbA1c, POC (controlled diabetic range): 13 % — AB (ref 0.0–7.0)

## 2022-04-14 MED ORDER — INSULIN GLARGINE 100 UNIT/ML SOLOSTAR PEN: 10.0000 [IU] | PEN_INJECTOR | SUBCUTANEOUS | 2 refills | Status: DC

## 2022-04-14 MED ORDER — INSULIN PEN NEEDLE 32G X 4 MM MISC: 1.0000 | Freq: Every day | 2 refills | Status: DC

## 2022-04-14 NOTE — Progress Notes (Signed)
S:     Chief Complaint  Patient presents with   Medication Management    Diabetes - CGM - Insulin start - Hypertension   72 y.o. male who presents for diabetes evaluation, education, and management.  PMH is significant for T2DM, HTN, HLD, PVD, bilateral BKA, and blindness. Patient was last seen and referred by Primary Care Provider, Dr. Oleh Genin, on 04/13/2022.  At last visit, Dr. Oleh Genin tried to d/c the Tradjenta and start Rybelsus, however approval at the pharmacy is still pending.    Today, patient arrives in good spirits and presents in a wheelchair needing assistance due to blindness and bilateral leg prosthetics.  Current diabetes medications include: Tradjenta (linagliptin) 5 mg at bedtime Current hypertension medications include: metoprolol succinate 12.5 mg at bedtime, enalapril 10 mg daily  Current hyperlipidemia medications include: atorvastatin 80 mg  Patient reports adherence to taking all medications as prescribed. However, patient reports not taking enalapril 10 mg daily, and has not been able to get Rybelsus (semaglutide) 3 mg from the pharmacy.  Insurance coverage: [facility] Medicare  Patient denies hypoglycemic events.  Patient denies nocturia (nighttime urination).  Patient denies neuropathy (nerve pain).  O:   Review of Systems  Eyes:        Patient is blind  All other systems reviewed and are negative.   Physical Exam Vitals reviewed.  Constitutional:      Appearance: Normal appearance. He is normal weight.  Pulmonary:     Effort: Pulmonary effort is normal.  Neurological:     Mental Status: He is alert.  Psychiatric:        Mood and Affect: Mood normal.        Behavior: Behavior normal.        Thought Content: Thought content normal.        Judgment: Judgment normal.     Lab Results  Component Value Date   HGBA1C 13.0 (A) 04/14/2022   Vitals:   04/14/22 1038 04/14/22 1109  BP: (!) 160/69 (!) 160/73  Pulse: 61   SpO2: 100%     Lipid  Panel     Component Value Date/Time   CHOL 128 09/19/2020 1020   TRIG 64 09/19/2020 1020   HDL 60 09/19/2020 1020   CHOLHDL 2.1 09/19/2020 1020   CHOLHDL 2.4 05/17/2015 1519   VLDL 19 05/17/2015 1519   LDLCALC 55 09/19/2020 1020    Clinical Atherosclerotic Cardiovascular Disease (ASCVD): No  The ASCVD Risk score (Arnett DK, et al., 2019) failed to calculate for the following reasons:   The valid total cholesterol range is 130 to 320 mg/dL    A/P: Diabetes longstanding currently uncontrolled. Patient is able to verbalize appropriate hypoglycemia management plan. Medication adherence appears good. Control is suboptimal due to inadequate therapies. POC A1c today was 13 -Started basal insulin Lantus (insulin glargine) 10 units daily in the morning.  -Patient educated on purpose, proper use, and potential adverse effects of Lantus (insulin glargine).  -Enrolled patient in the [employer] Study; provided 3 months of sensors and counseling on how to use CGM including text to voice notifications set-up.  -Extensively discussed pathophysiology of diabetes, recommended lifestyle interventions, dietary effects on blood sugar control.  -Counseled on s/sx of and management of hypoglycemia.  -Next A1c anticipated in 3 months (May).  -Consider restarting Jardiance (empagliflozin) in the future once A1c is better controlled.   ASCVD risk - primary prevention in patient with diabetes. Last LDL is at goal of <70 mg/dL. ASCVD risk factors  include T2DM, HTN, and HLD. high intensity statin indicated.  -Continued atorvastatin 80 mg.   Hypertension longstanding currently uncontrolled. Blood pressure goal of <130/80 mmHg. Medication adherence appears poor. Blood pressure control is suboptimal due to patient not taking enalapril 10 mg daily. -Restarted enalapril 10 mg. -Continue metoprolol succinate 12.5 mg daily.  Allergic rhinitis - long-standing.  Discussed consideration for avoiding diphenhydramine as his  antihistamine due to anticholinergic side effects and sedation.  Recommended trial of alternate second generation antihistamine.  Suggested loratadine (claritan) or fexofenadine (allegra) once daily.    Written patient instructions provided. Patient verbalized understanding of treatment plan.  Total time in face to face counseling 70 minutes.    Follow-up:  Pharmacist 2 weeks (04/28/2022). Patient seen with Francena Hanly, PharmD PGY-1 Pharmacy Resident and Dixon Boos, PharmD Candidate.   Addendum: (telephone)  Outgoing telephone call to patient 04/14/2022 at 2:00 pm. Educated patient on how to turn on text to speech notifications for [employer] app.

## 2022-04-14 NOTE — Assessment & Plan Note (Signed)
Diabetes longstanding currently uncontrolled. Patient is able to verbalize appropriate hypoglycemia management plan. Medication adherence appears good. Control is suboptimal due to inadequate therapies. POC A1c today was 13 -Started basal insulin Lantus (insulin glargine) 10 units daily in the morning.  -Patient educated on purpose, proper use, and potential adverse effects of Lantus (insulin glargine).  -Enrolled patient in the LIBERATE Study; provided 3 months of sensors and counseling on how to use CGM including text to voice notifications set-up.  -Extensively discussed pathophysiology of diabetes, recommended lifestyle interventions, dietary effects on blood sugar control.  -Counseled on s/sx of and management of hypoglycemia.  -Next A1c anticipated in 3 months (May).  -Consider restarting Jardiance (empagliflozin) in the future once A1c is better controlled.

## 2022-04-14 NOTE — Assessment & Plan Note (Signed)
Hypertension longstanding currently uncontrolled. Blood pressure goal of <130/80 mmHg. Medication adherence appears poor. Blood pressure control is suboptimal due to patient not taking enalapril 10 mg daily. -Restarted enalapril 10 mg. -Continue metoprolol succinate 12.5 mg daily.

## 2022-04-14 NOTE — Patient Instructions (Addendum)
Sensor Application If using the App, you can tap Help in the Main Menu to access an in-app tutorial on applying a Sensor. See below for instructions on how to download the app. Apply Sensors only on the back of your upper arm. If placed in other areas, the Sensor may not function properly and could give you inaccurate readings. Avoid areas with scars, moles, stretch marks, or lumps.   Select an area of skin that generally stays flat during your normal daily activities (no bending or folding). Choose a site that is at least 1 inch (2.5 cm) away from any injection sites. To prevent discomfort or skin irritation, you should select a different site other than the one most recently used. Wash application site using a plain soap, dry, and then clean with an alcohol wipe. This will help remove any oily residue that may prevent the sensor from sticking properly. Allow site to air dry before proceeding. Note: The area MUST be clean and dry, or the Sensor may not stay on for the full wear duration specified by your Sensor insert. 4. Unscrew the cap from the Sensor Applicator and set the cap aside.  5. Place the Sensor Applicator over the prepared site and push down firmly to apply the Sensor to your body. 6. Gently pull the Sensor Applicator away from your body. The Sensor should now be attached to your skin. 7. Make sure the Sensor is secure after application. Put the cap back on the Sensor Applicator. Discard the used Engineer, civil (consulting) according to local regulations.  What If My Sensor Falls Off or What If My Sensor Isn't Working? Call [facility] Team at [phone] Available 7 days a week from 8AM-8PM EST, excluding holidays If yo have multiple sensors fall off prior to 14 days of use, contact [place] at [phone]   The App Download the [place] 3 App in your phone's app store   Load the app and select get started now Create an account  Tap scan new  sensor Follow the prompts on the screen. If your sensor does not sync, try moving your phone slowly around the sensor. Phone cases may affect scanning. This will be the only time you have to scan the sensor until you apply a new sensor in 14 days.  There will be a 60 minute start up period until the app will display your glucose reading   Medication Changes:  -Start Lantus (insulin glargine) 10 units daily.   -Start using your new Libre 3 sensors to monitor your sugar levels.   -Restart enalapril 10 mg daily for blood pressure

## 2022-04-15 NOTE — Progress Notes (Signed)
Reviewed and agree with Dr Graylin Shiver plan.

## 2022-04-23 ENCOUNTER — Telehealth: Payer: Self-pay | Admitting: Pharmacist

## 2022-04-23 DIAGNOSIS — E1149 Type 2 diabetes mellitus with other diabetic neurological complication: Secondary | ICD-10-CM

## 2022-04-23 MED ORDER — INSULIN GLARGINE 100 UNIT/ML SOLOSTAR PEN: 12.0000 [IU] | PEN_INJECTOR | SUBCUTANEOUS | Status: DC

## 2022-04-23 NOTE — Telephone Encounter (Signed)
Patient contacted for 1 week CGM - Liberate study.    Patient reports CGM functioning well.  He shared that he does not like the numbers.  He reports many readings> 300 with only a few readings < 200  We agreed to increase his insulin glargine from 10 to 12 units daily.   He plans to return for follow-up 3/12  He does not have Rybelsus at this time.

## 2022-04-23 NOTE — Telephone Encounter (Signed)
Attempted to contact patient for CGM - LIBERATE follow-up.   Left HIPAA compliant message.   Plan to try again later in the day.

## 2022-04-24 NOTE — Telephone Encounter (Signed)
Reviewed and agree with Dr Koval's plan.   

## 2022-04-27 ENCOUNTER — Other Ambulatory Visit (HOSPITAL_COMMUNITY): Payer: Self-pay

## 2022-04-27 ENCOUNTER — Other Ambulatory Visit: Payer: Self-pay | Admitting: Family Medicine

## 2022-04-27 DIAGNOSIS — E1149 Type 2 diabetes mellitus with other diabetic neurological complication: Secondary | ICD-10-CM

## 2022-04-28 ENCOUNTER — Encounter: Payer: Self-pay | Admitting: Pharmacist

## 2022-04-28 ENCOUNTER — Ambulatory Visit: Payer: Medicare Other | Admitting: Pharmacist

## 2022-04-28 VITALS — BP 162/79 | HR 64

## 2022-04-28 DIAGNOSIS — E1142 Type 2 diabetes mellitus with diabetic polyneuropathy: Secondary | ICD-10-CM

## 2022-04-28 DIAGNOSIS — I10 Essential (primary) hypertension: Secondary | ICD-10-CM | POA: Diagnosis not present

## 2022-04-28 DIAGNOSIS — E1149 Type 2 diabetes mellitus with other diabetic neurological complication: Secondary | ICD-10-CM

## 2022-04-28 MED ORDER — TRULICITY 0.75 MG/0.5ML ~~LOC~~ SOAJ: 0.7500 mg | SUBCUTANEOUS | 1 refills | Status: DC

## 2022-04-28 MED ORDER — ENALAPRIL MALEATE 20 MG PO TABS: 20.0000 mg | ORAL_TABLET | Freq: Every day | ORAL | 3 refills | Status: DC

## 2022-04-28 NOTE — Assessment & Plan Note (Signed)
Hypertension longstanding and currently uncontrolled. Blood pressure goal of <130/80 mmHg.Patient is adherent to medications. Blood pressure control is suboptimal due to elevated readings in office. -Increased dose of Enalapril to 20 mg daily

## 2022-04-28 NOTE — Progress Notes (Signed)
S:     72 y.o. male who presents for diabetes and Hypertension evaluation, education, and management.  PMH is significant for T2DM, HTN Patient was referred and last seen by Primary Care Provider, Dr. Oleh Genin, on 04/03/2022.  At last visit, attempt was made to obtain Rybelsus for T2DM management due to elevated A1c.  Today, patient arrives in good spirits and presents with mobility assistance due to blindness and being in wheelchair.  Deferred height and weight measurement.   Patient reports Diabetes was diagnosed in 05/2010.   Current diabetes medications include: Lantus (insulin glargine) 12U every morning Current hypertension medications include: Enalapril '10mg'$   Patient reports adherence to taking all medications as prescribed.   Do you feel that your medications are working for you? yes Have you been experiencing any side effects to the medications prescribed? no Do you have any problems obtaining medications due to transportation or finances? no Insurance coverage: [employer]  Patient denies hypoglycemic events.  Reported home fasting blood sugars in the 300s   Patient denies nocturia (nighttime urination).  Patient denies neuropathy (nerve pain). Patient denies visual changes. Patient denies self foot exams.    Within the past 12 months, did you worry whether your food would run out before you got money to buy more? no Within the past 12 months, did the food you bought run out, and you didn't have money to get more? no   O:   Review of Systems  Eyes:        Blind  Musculoskeletal:        Limited mobility due to bilateral AKA.   All other systems reviewed and are negative.   Physical Exam Constitutional:      Appearance: Normal appearance.  Pulmonary:     Effort: Pulmonary effort is normal.  Neurological:     Mental Status: He is alert.  Psychiatric:        Mood and Affect: Mood normal.        Behavior: Behavior normal.        Thought  Content: Thought content normal.        Judgment: Judgment normal.      CGM Download:  % Time CGM is active: 96% Average Glucose: 302 mg/dL Glucose Management Indicator: 10.5%  Glucose Variability: 19.6% (goal <36%) Time in Goal:  - Time in range 70-180: 1% - Time above range: 99% (79% Very high >250 mg/dL, 20% high 181-250 mg/dL) - Time below range: 0% Observed patterns:   Lab Results  Component Value Date   HGBA1C 13.0 (A) 04/14/2022   Vitals:   04/28/22 1010 04/28/22 1012  BP: (!) 166/78 (!) 162/79  Pulse:    SpO2:        A/P: Diabetes longstanding and currently uncontrolled. Patient is able to verbalize appropriate hypoglycemia management plan. Patient adherent to medications. Control is suboptimal due to elevated glucose readings from CGM. -Increased dose of basal insulin Lantus (insulin glargine) from 12 units to 14 units daily in the morning   -Started GLP-1 Trulicity (dulaglutide) 0.75 mg once weekly   -Discontinued DPP-4 inhibitor Tradjenta (Linagliptin)  -Patient educated on purpose, proper use, and potential adverse effects of Trulicity.  -Extensively discussed pathophysiology of diabetes, recommended lifestyle interventions, dietary effects on blood sugar control.  -Counseled on s/sx of and management of hypoglycemia.  -Next A1c anticipated 07/13/22.   Hypertension longstanding and currently uncontrolled. Blood pressure goal of <130/80 mmHg.Patient is adherent to medications. Blood pressure control is suboptimal due to  elevated readings in office. -Increased dose of Enalapril to 20 mg daily  Written patient instructions provided. Patient verbalized understanding of treatment plan.  Total time in face to face counseling 45 minutes.    Follow-up:  Pharmacist 05/19/2022 PCP clinic visit in 1-2 months.  Patient seen with Louanne Belton PharmD PGY-1 Pharmacy Resident and Estelle June, PharmD Candidate.

## 2022-04-28 NOTE — Assessment & Plan Note (Signed)
Diabetes longstanding and currently uncontrolled. Patient is able to verbalize appropriate hypoglycemia management plan. Patient adherent to medications. Control is suboptimal due to elevated glucose readings from CGM. -Increased dose of basal insulin Lantus (insulin glargine) from 12 units to 14 units daily in the morning   -Started GLP-1 Trulicity (dulaglutide) 0.75 mg once weekly   -Discontinued DPP-4 inhibitor Tradjenta (Linagliptin)  -Patient educated on purpose, proper use, and potential adverse effects of Trulicity.  -Extensively discussed pathophysiology of diabetes, recommended lifestyle interventions, dietary effects on blood sugar control.  -Counseled on s/sx of and management of hypoglycemia.  -Next A1c anticipated 07/13/22.

## 2022-04-28 NOTE — Patient Instructions (Addendum)
It was nice to see you today!  Your goal blood sugar is 80-130 before eating and less than 180 after eating.  Medication Changes: Increase your dose of enalapril from 10 mg daily to 20 mg daily  Increase Lantus (insulin glargine) from 12 units to 14 units once daily.  Start Trulicity [id number] mg once weekly.   Monitor blood sugars at home and call Dr. Valentina Lucks if you start seeing readings consistently below 150 mg/dL.  Keep up the good work with diet and exercise. Aim for a diet full of vegetables, fruit and lean meats (chicken, [place], fish). Try to limit salt intake by eating fresh or frozen vegetables (instead of canned), rinse canned vegetables prior to cooking and do not add any additional salt to meals.

## 2022-04-29 NOTE — Progress Notes (Signed)
Reviewed and agree with Dr Koval's plan.   

## 2022-05-19 ENCOUNTER — Ambulatory Visit: Payer: Medicare Other | Admitting: Pharmacist

## 2022-05-19 VITALS — BP 130/58 | HR 58

## 2022-05-19 DIAGNOSIS — I10 Essential (primary) hypertension: Secondary | ICD-10-CM

## 2022-05-19 DIAGNOSIS — E1149 Type 2 diabetes mellitus with other diabetic neurological complication: Secondary | ICD-10-CM | POA: Diagnosis not present

## 2022-05-19 MED ORDER — LANTUS SOLOSTAR 100 UNIT/ML ~~LOC~~ SOPN: 14.0000 [IU] | PEN_INJECTOR | Freq: Every day | SUBCUTANEOUS | 3 refills | Status: DC

## 2022-05-19 NOTE — Progress Notes (Signed)
S:     Chief Complaint  Patient presents with   Medication Management    T2DM   72 y.o. male who presents for diabetes evaluation, education, and management.  PMH is significant for T2DM, HTN Patient was referred and last seen by Primary Care Provider, Dr. Oleh Genin, on 04/03/22.  At last visit, Trulicity initiated.   Today, patient arrives in good spirits and presents with assistance to maneuver due to blindness. Patient is in wheelchair from bilateral AKA.  Patient reports Diabetes was diagnosed in 2012.   Current diabetes medications include: Trulicity (dulaglutide) 0.75 mg weekly, Lantus (insulin glargine) 14 units daily  Current hypertension medications include: Enalapril 20 mg  Current hyperlipidemia medications include: Atorvastatin 80 mg  Patient reports adherence to taking all medications as prescribed.   Do you feel that your medications are working for you? yes Have you been experiencing any side effects to the medications prescribed? no Do you have any problems obtaining medications due to transportation or finances? no Insurance coverage: [employer]   Patient denies hypoglycemic events. Patient does not report any lows. Patient reports he does not see 300s anymore. Does report some alarms from the CGM when his BG goes >250 mg/dL. Reported his BG this morning was 94.  Within the past 12 months, did you worry whether your food would run out before you got money to buy more? no Within the past 12 months, did the food you bought run out, and you didn't have money to get more? no   O:   Review of Systems  All other systems reviewed and are negative.   Physical Exam Vitals reviewed.  Pulmonary:     Effort: Pulmonary effort is normal.  Neurological:     Mental Status: He is alert.  Psychiatric:        Mood and Affect: Mood normal.        Behavior: Behavior normal.        Thought Content: Thought content normal.        Judgment: Judgment normal.     CGM  Download:  % Time CGM is active: 96% Average Glucose: 158 mg/dL Glucose Management Indicator: 7.1%  Glucose Variability: 24.9% (goal <36%) Time in Goal:  - Time in range 70-180: 75% - Time above range: 25% with 22% 180-250 - Time below range: 0% Observed patterns:  No low readings (consistent with no symptoms of low blood sugar)   Lab Results  Component Value Date   HGBA1C 13.0 (A) 04/14/2022   Vitals:   05/19/22 0940  BP: (!) 130/58  Pulse: (!) 58  SpO2: 100%    Lipid Panel     Component Value Date/Time   CHOL 128 09/19/2020 1020   TRIG 64 09/19/2020 1020   HDL 60 09/19/2020 1020   CHOLHDL 2.1 09/19/2020 1020   CHOLHDL 2.4 05/17/2015 1519   VLDL 19 05/17/2015 1519   LDLCALC 55 09/19/2020 1020    A/P: Diabetes diagnosed in 2012. Patient is able to verbalize appropriate hypoglycemia management plan. Medication adherence appears good. Control is much improved and near optimal control based on CGM. -Continued basal insulin Lantus (insulin glargine) 14 units daily in the morning -Continued GLP-1 Trulicity (dulaglutide) at 0.75 mg   -Patient educated on purpose, proper use, and potential adverse effects.  -Extensively discussed pathophysiology of diabetes, recommended lifestyle interventions, dietary effects on blood sugar control.  -Counseled on s/sx of and management of hypoglycemia.  -Next A1c anticipated at 3 month research visit for LIBERATE  study - visit scheduled for late May 2024.   Hypertension longstanding currently well controlled. Blood pressure control 130/58 is near goal of <130/80 mmHg. Medication adherence reported as good.  -Continued Enalapril 20mg  daily  Discussed lab work to be drawn at next visit including lipid panel and UACR.  Written patient instructions provided. Patient verbalized understanding of treatment plan.  Total time in face to face counseling 25 minutes.    Follow-up:  Pharmacist May 28th. PCP clinic visit in June 14th.  Patient  seen with Gena Fray, PharmD PGY-1 Pharmacy Resident, Lazarus Gowda, PharmD Candidate and Estelle June, PharmD Candidate.

## 2022-05-19 NOTE — Patient Instructions (Addendum)
It was great seeing you today! We have no changes to your current medications! Keep up the good work!

## 2022-05-19 NOTE — Assessment & Plan Note (Signed)
Diabetes diagnosed in 2012. Patient is able to verbalize appropriate hypoglycemia management plan. Medication adherence appears good. Control is much improved and near optimal control based on CGM. -Continued basal insulin Lantus (insulin glargine) 14 units daily in the morning -Continued GLP-1 Trulicity (dulaglutide) at 0.75 mg   -Patient educated on purpose, proper use, and potential adverse effects.  -Extensively discussed pathophysiology of diabetes, recommended lifestyle interventions, dietary effects on blood sugar control.  -Counseled on s/sx of and management of hypoglycemia.  -Next A1c anticipated at 3 month research visit for LIBERATE study - visit scheduled for late May 2024.

## 2022-05-19 NOTE — Assessment & Plan Note (Signed)
Hypertension longstanding currently well controlled. Blood pressure control 130/58 is near goal of <130/80 mmHg. Medication adherence reported as good.  -Continued Enalapril 20mg  daily

## 2022-05-20 NOTE — Progress Notes (Signed)
Reviewed and agree with Dr Koval's plan.   

## 2022-05-28 ENCOUNTER — Other Ambulatory Visit: Payer: Self-pay | Admitting: Family Medicine

## 2022-05-28 DIAGNOSIS — E1149 Type 2 diabetes mellitus with other diabetic neurological complication: Secondary | ICD-10-CM

## 2022-06-19 ENCOUNTER — Ambulatory Visit: Payer: Medicare Other | Admitting: Family Medicine

## 2022-06-19 ENCOUNTER — Encounter: Payer: Self-pay | Admitting: Family Medicine

## 2022-06-19 ENCOUNTER — Other Ambulatory Visit: Payer: Self-pay

## 2022-06-19 VITALS — BP 180/80 | HR 58 | Ht 77.0 in | Wt 215.0 lb

## 2022-06-19 DIAGNOSIS — Z139 Encounter for screening, unspecified: Secondary | ICD-10-CM | POA: Diagnosis not present

## 2022-06-19 DIAGNOSIS — I10 Essential (primary) hypertension: Secondary | ICD-10-CM

## 2022-06-19 NOTE — Assessment & Plan Note (Signed)
Hypertensive initially in the 200s and was 180/80 upon repeat.  BP goal of <130/80, patient typically within goal range at home and was at goal during last visit with pharmacy team. - Continue enalapril 20 mg daily - Check BP 2 times daily for the next 3 to 5 days and record values - If systolic > 140 persistently, patient to call the office and we will adjust medications

## 2022-06-19 NOTE — Patient Instructions (Signed)
I have the paperwork and I will fax it over, I also provided you with a copy of the paperwork in case something happens with the fax.  Your blood pressure was elevated today, I want you to check it over the next 3 to 5 days and call me if your top number is over 140 consistently.

## 2022-06-19 NOTE — Progress Notes (Signed)
    SUBJECTIVE:   CHIEF COMPLAINT / HPI:   SDOH - Patient needs assistance with transportation - Paperwork brought in by patient to be filled out - Patient with bilateral amputations of the lower limbs as well as blindness  Hypertension - Better significantly elevated in the office - Patient reports home measurements are typically 120s-150s/70s-80s - Patient currently asymptomatic - Takes his blood pressure medications at night and is adherent to enalapril 20 mg and metoprolol  PERTINENT  PMH / PSH: T2DM, HTN, bilateral BKA, blindness  OBJECTIVE:   BP (!) 180/80   Pulse (!) 58   Ht 6\' 5"  (1.956 m)   Wt 215 lb (97.5 kg)   SpO2 100%   BMI 25.50 kg/m   Gen: well-appearing, NAD CV: RRR, no m/r/g appreciated, no peripheral edema  ASSESSMENT/PLAN:   Hypertension Hypertensive initially in the 200s and was 180/80 upon repeat.  BP goal of <130/80, patient typically within goal range at home and was at goal during last visit with pharmacy team. - Continue enalapril 20 mg daily - Check BP 2 times daily for the next 3 to 5 days and record values - If systolic > 140 persistently, patient to call the office and we will adjust medications   SDOH Paperwork for transportation with [employer] completed today. Patient provided with copy of filled forms and copy also placed in fax pile.   Evelena Leyden, DO [facility]

## 2022-06-22 ENCOUNTER — Telehealth: Payer: Self-pay

## 2022-06-22 NOTE — Telephone Encounter (Signed)
Patient call nurse line in regards to blood pressure.   He reports over the weekend and today his blood pressure has been consistently running in the 170s/80s.  He reports he has been asymptomatic. Denies chest pains, SOB, headaches or dizziness.   He reports he has been compliant with his BP medications.  Will forward to PCP.   Precautions discussed.

## 2022-06-23 MED ORDER — HYDROCHLOROTHIAZIDE 12.5 MG PO CAPS: 12.5000 mg | ORAL_CAPSULE | Freq: Every day | ORAL | 0 refills | Status: DC

## 2022-06-23 NOTE — Telephone Encounter (Signed)
Called patient to discuss elevated blood pressures, discussed the case with Dr. Raymondo Band.  Reviewed patient's blood sugar log via CGM and blood sugars are much better controlled.  Discussed with patient about adding HCTZ and patient is agreeable.  - Prescription of HCTZ 12.5 mg sent to pharmacy - Continue to monitor blood pressures at home after starting the medication - Follow-up at regular visits as already scheduled

## 2022-06-29 ENCOUNTER — Telehealth: Payer: Self-pay | Admitting: Orthopedic Surgery

## 2022-06-29 NOTE — Telephone Encounter (Signed)
Faxed Rx

## 2022-06-29 NOTE — Telephone Encounter (Signed)
Patient needs orthotics socks and liners he needs the Rx faxed over to [facility] clinic, he is currently at the clinic now

## 2022-06-30 ENCOUNTER — Telehealth: Payer: Self-pay

## 2022-06-30 NOTE — Telephone Encounter (Signed)
Patient calls nurse line in regards to [employer] forms.   He reports forms were received, however they were missing information.   He reports the forms were faxed back to PCP for correction.   I did not see these in PCP box.   Will forward to PCP to make aware.

## 2022-07-06 NOTE — Telephone Encounter (Signed)
Patient returns call to nurse line.  Advised we still have not received the [employer] forms. Patient gave me the number to [employer] and representative he has been speaking with.   I called Courtney to discuss, however I had to LVM.   Will await her call.

## 2022-07-06 NOTE — Telephone Encounter (Signed)
VM left for patient to return my call to discuss [employer] forms.

## 2022-07-14 ENCOUNTER — Encounter (INDEPENDENT_AMBULATORY_CARE_PROVIDER_SITE_OTHER): Payer: Medicare Other | Admitting: Pharmacist

## 2022-07-14 ENCOUNTER — Encounter: Payer: Self-pay | Admitting: Pharmacist

## 2022-07-14 DIAGNOSIS — E1149 Type 2 diabetes mellitus with other diabetic neurological complication: Secondary | ICD-10-CM

## 2022-07-14 LAB — POCT GLYCOSYLATED HEMOGLOBIN (HGB A1C): HbA1c, POC (controlled diabetic range): 6.1 % (ref 0.0–7.0)

## 2022-07-14 MED ORDER — LANTUS SOLOSTAR 100 UNIT/ML ~~LOC~~ SOPN
13.0000 [IU] | PEN_INJECTOR | Freq: Every day | SUBCUTANEOUS | 3 refills | Status: DC
Start: 2022-07-14 — End: 2022-07-31

## 2022-07-14 NOTE — Telephone Encounter (Signed)
Patient presents to [facility] with [employer] forms.   Forms were faxed to [employer] [facility].   Copy made for batch scanning.

## 2022-07-14 NOTE — Patient Instructions (Addendum)
It was nice to see you today!  Your goal blood sugar is 80-130 before eating and less than 180 after eating.  Medication Changes:  Decrease Insulin Glargine (Lantus) to 13 units once daily.   Your next appointment with Dr. Raymondo Band is on Tuesday, August 18, 2022 at 9:30 AM.   Monitor blood sugars at home and keep a log (glucometer or piece of paper) to bring with you to your next visit.  Keep up the good work with diet and exercise. Aim for a diet full of vegetables, fruit and lean meats (chicken, [place], fish). Try to limit salt intake by eating fresh or frozen vegetables (instead of canned), rinse canned vegetables prior to cooking and do not add any additional salt to meals.

## 2022-07-14 NOTE — Research (Signed)
S:     Chief Complaint  Patient presents with   Medication Management    Liberate Study - 3 month visit - T2DM and CGM F/U   72 y.o. male who presents for diabetes evaluation, education, and management.  PMH is significant for HTN, T2DM, and HLD.  Patient was referred and last seen by Primary Care Provider, Dr. Clayborne Artist, on 04/03/22.  At last visit, continued basal insulin Lantus (insulin glargine) 14 units daily in the morning, continued GLP-1 Trulicity (dulaglutide) at 0.75 mg, and continued enalapril 20mg  daily.   Today, patient arrives in good spirits and presents with mobility assistance due to blindness and being in wheelchair.   Patient reports Diabetes was diagnosed in 05/2010.   Current diabetes medications include: Trulicity 0.75 mg weekly, insulin glargine (Lantus) 14 units daily Current hypertension medications include: enalapril (Vasotec) 20 mg daily, metoprolol Succinate SR (Toprol-XL) 12.5 mg daily, hydrochlorothiazide (Microzide) 12.5 mg daily Current hyperlipidemia medications include: atorvastatin 80 mg daily  Patient reports adherence to taking all medications as prescribed.   Do you feel that your medications are working for you? yes Have you been experiencing any side effects to the medications prescribed? no Do you have any problems obtaining medications due to transportation or finances? no Insurance coverage: [employer]  Patient reports hypoglycemic events (about once a week).   Patient reports nocturia (nighttime urination).  Patient reports neuropathy (nerve pain).  Patient reported dietary habits: Eats 3 meals/day Patient reports snacking habit with chips and pies in moderation. Consumes vegetables and fruits daily.   Within the past 12 months, did you worry whether your food would run out before you got money to buy more? no Within the past 12 months, did the food you bought run out, and you didn't have money to get more? no   O:    Review of Systems  All other systems reviewed and are negative.   Physical Exam Pulmonary:     Effort: Pulmonary effort is normal.  Neurological:     Mental Status: He is alert.  Psychiatric:        Mood and Affect: Mood normal.        Behavior: Behavior normal.        Thought Content: Thought content normal.    CGM Download:  % Time CGM is active: 96% Average Glucose: 125 mg/dL Glucose Management Indicator: 6.3  Glucose Variability: 25.3% (goal <36%) Time in Goal:  - Time in range 70-180: 95% - Time above range: 5% - Time below range: 0% Observed patterns:   Lab Results  Component Value Date   HGBA1C 6.1 07/14/2022   Vitals:   07/14/22 0933 07/14/22 0948  BP: (!) 145/66 128/70  Pulse: 66   SpO2: 100%     Lipid Panel     Component Value Date/Time   CHOL 128 09/19/2020 1020   TRIG 64 09/19/2020 1020   HDL 60 09/19/2020 1020   CHOLHDL 2.1 09/19/2020 1020   CHOLHDL 2.4 05/17/2015 1519   VLDL 19 05/17/2015 1519   LDLCALC 55 09/19/2020 1020    Clinical Atherosclerotic Cardiovascular Disease (ASCVD): No  The ASCVD Risk score (Arnett DK, et al., 2019) failed to calculate for the following reasons:   The valid total cholesterol range is 130 to 320 mg/dL   A/P: Liberate Study - 3 month visit.  Diabetes longstanding currently controlled with an A1c today of 6.1%. Patient is able to verbalize appropriate hypoglycemia management plan. Reports 1-2 episodes of low  readings per week. Medication adherence appears optimal.  -Decreased insulin Glargine (Lantus) from 14 to 13 units once daily.  -Continue Trulicity 0.75 mg weekly -Provided with 4 month (8 sensors) supply Libre 3 sensors.  -Extensively discussed pathophysiology of diabetes, recommended lifestyle interventions, dietary effects on blood sugar control.  -Counseled on s/sx of and management of hypoglycemia.  -Next A1c anticipated in 3 months.   Hypertension longstanding and currently controlled. Blood  pressure goal of <130/80 mmHg. Medication adherence is optimal.  -No changes to HTN medications today.  -Continued enalapril (Vasotec) 20 mg daily, metoprolol Succinate SR (Toprol-XL) 12.5 mg daily, hydrochlorothiazide (Microzide) 12.5 mg daily  Attempted to assist with transportation ([employer]) paperwork.  Forms were refaxed with help of of Virl Son, CMA.  Patient asked to let us know if there is any other way for [place] to help.   Written patient instructions provided. Patient verbalized understanding of treatment plan.  Total time in face to face counseling 32 minutes.    Follow-up:  Pharmacist on 08/18/2022. PCP clinic visit in 07/31/2022.  Patient seen with Haze Boyden PharmD Candidate and Bing Plume, PharmD Candidate.

## 2022-07-17 ENCOUNTER — Other Ambulatory Visit: Payer: Self-pay | Admitting: Family Medicine

## 2022-07-17 NOTE — Progress Notes (Signed)
Reviewed and agree with Dr Koval's plan.   

## 2022-07-28 ENCOUNTER — Ambulatory Visit (INDEPENDENT_AMBULATORY_CARE_PROVIDER_SITE_OTHER): Payer: Medicare Other | Admitting: *Deleted

## 2022-07-28 DIAGNOSIS — Z0000 Encounter for general adult medical examination without abnormal findings: Secondary | ICD-10-CM

## 2022-07-28 NOTE — Patient Instructions (Signed)
Chad Hall , Thank you for taking time to come for your Medicare Wellness Visit. I appreciate your ongoing commitment to your health goals. Please review the following plan we discussed and let me know if I can assist you in the future.   Screening recommendations/referrals: Colonoscopy: up to date Recommended yearly ophthalmology/optometry visit for glaucoma screening and checkup Recommended yearly dental visit for hygiene and checkup  Vaccinations: Influenza vaccine: up to date Pneumococcal vaccine: up to date Tdap vaccine: up to date  Education provided  Shingles vaccine: Education provided    Advanced directives: Education provided     Preventive Care 65 Years and Older, Male Preventive care refers to lifestyle choices and visits with your health care provider that can promote health and wellness. What does preventive care include? A yearly physical exam. This is also called an annual well check. Dental exams once or twice a year. Routine eye exams. Ask your health care provider how often you should have your eyes checked. Personal lifestyle choices, including: Daily care of your teeth and gums. Regular physical activity. Eating a healthy diet. Avoiding tobacco and drug use. Limiting alcohol use. Practicing safe sex. Taking low doses of aspirin every day. Taking vitamin and mineral supplements as recommended by your health care provider. What happens during an annual well check? The services and screenings done by your health care provider during your annual well check will depend on your age, overall health, lifestyle risk factors, and family history of disease. Counseling  Your health care provider may ask you questions about your: Alcohol use. Tobacco use. Drug use. Emotional well-being. Home and relationship well-being. Sexual activity. Eating habits. History of falls. Memory and ability to understand (cognition). Work and work Astronomer. Screening  You  may have the following tests or measurements: Height, weight, and BMI. Blood pressure. Lipid and cholesterol levels. These may be checked every 5 years, or more frequently if you are over 47 years old. Skin check. Lung cancer screening. You may have this screening every year starting at age 49 if you have a 30-pack-year history of smoking and currently smoke or have quit within the past 15 years. Fecal occult blood test (FOBT) of the stool. You may have this test every year starting at age 99. Flexible sigmoidoscopy or colonoscopy. You may have a sigmoidoscopy every 5 years or a colonoscopy every 10 years starting at age 54. Prostate cancer screening. Recommendations will vary depending on your family history and other risks. Hepatitis C blood test. Hepatitis B blood test. Sexually transmitted disease (STD) testing. Diabetes screening. This is done by checking your blood sugar (glucose) after you have not eaten for a while (fasting). You may have this done every 1-3 years. Abdominal aortic aneurysm (AAA) screening. You may need this if you are a current or former smoker. Osteoporosis. You may be screened starting at age 76 if you are at high risk. Talk with your health care provider about your test results, treatment options, and if necessary, the need for more tests. Vaccines  Your health care provider may recommend certain vaccines, such as: Influenza vaccine. This is recommended every year. Tetanus, diphtheria, and acellular pertussis (Tdap, Td) vaccine. You may need a Td booster every 10 years. Zoster vaccine. You may need this after age 41. Pneumococcal 13-valent conjugate (PCV13) vaccine. One dose is recommended after age 26. Pneumococcal polysaccharide (PPSV23) vaccine. One dose is recommended after age 31. Talk to your health care provider about which screenings and vaccines you need and how  often you need them. This information is not intended to replace advice given to you by your  health care provider. Make sure you discuss any questions you have with your health care provider. Document Released: 03/01/2015 Document Revised: 10/23/2015 Document Reviewed: 12/04/2014 Elsevier Interactive Patient Education  2017 [place] Prevention in the Home Falls can cause injuries. They can happen to people of all ages. There are many things you can do to make your home safe and to help prevent falls. What can I do on the outside of my home? Regularly fix the edges of walkways and driveways and fix any cracks. Remove anything that might make you trip as you walk through a door, such as a raised step or threshold. Trim any bushes or trees on the path to your home. Use bright outdoor lighting. Clear any walking paths of anything that might make someone trip, such as rocks or tools. Regularly check to see if handrails are loose or broken. Make sure that both sides of any steps have handrails. Any raised decks and porches should have guardrails on the edges. Have any leaves, snow, or ice cleared regularly. Use sand or salt on walking paths during winter. Clean up any spills in your garage right away. This includes oil or grease spills. What can I do in the bathroom? Use night lights. Install grab bars by the toilet and in the tub and shower. Do not use towel bars as grab bars. Use non-skid mats or decals in the tub or shower. If you need to sit down in the shower, use a plastic, non-slip stool. Keep the floor dry. Clean up any water that spills on the floor as soon as it happens. Remove soap buildup in the tub or shower regularly. Attach bath mats securely with double-sided non-slip rug tape. Do not have throw rugs and other things on the floor that can make you trip. What can I do in the bedroom? Use night lights. Make sure that you have a light by your bed that is easy to reach. Do not use any sheets or blankets that are too big for your bed. They should not hang down  onto the floor. Have a firm chair that has side arms. You can use this for support while you get dressed. Do not have throw rugs and other things on the floor that can make you trip. What can I do in the kitchen? Clean up any spills right away. Avoid walking on wet floors. Keep items that you use a lot in easy-to-reach places. If you need to reach something above you, use a strong step stool that has a grab bar. Keep electrical cords out of the way. Do not use floor polish or wax that makes floors slippery. If you must use wax, use non-skid floor wax. Do not have throw rugs and other things on the floor that can make you trip. What can I do with my stairs? Do not leave any items on the stairs. Make sure that there are handrails on both sides of the stairs and use them. Fix handrails that are broken or loose. Make sure that handrails are as long as the stairways. Check any carpeting to make sure that it is firmly attached to the stairs. Fix any carpet that is loose or worn. Avoid having throw rugs at the top or bottom of the stairs. If you do have throw rugs, attach them to the floor with carpet tape. Make sure that you have a  light switch at the top of the stairs and the bottom of the stairs. If you do not have them, ask someone to add them for you. What else can I do to help prevent falls? Wear shoes that: Do not have high heels. Have rubber bottoms. Are comfortable and fit you well. Are closed at the toe. Do not wear sandals. If you use a stepladder: Make sure that it is fully opened. Do not climb a closed stepladder. Make sure that both sides of the stepladder are locked into place. Ask someone to hold it for you, if possible. Clearly mark and make sure that you can see: Any grab bars or handrails. First and last steps. Where the edge of each step is. Use tools that help you move around (mobility aids) if they are needed. These include: Canes. Walkers. Scooters. Crutches. Turn  on the lights when you go into a dark area. Replace any light bulbs as soon as they burn out. Set up your furniture so you have a clear path. Avoid moving your furniture around. If any of your floors are uneven, fix them. If there are any pets around you, be aware of where they are. Review your medicines with your doctor. Some medicines can make you feel dizzy. This can increase your chance of falling. Ask your doctor what other things that you can do to help prevent falls. This information is not intended to replace advice given to you by your health care provider. Make sure you discuss any questions you have with your health care provider. Document Released: 11/29/2008 Document Revised: 07/11/2015 Document Reviewed: 03/09/2014 Elsevier Interactive Patient Education  2017 [employer].

## 2022-07-28 NOTE — Progress Notes (Signed)
Subjective:   Chad Hall is a 72 y.o. male who presents for Medicare Annual/Subsequent preventive examination.  I connected with  [name] on 07/28/22 by a telephone enabled telemedicine application and verified that I am speaking with the correct person using two identifiers.   I discussed the limitations of evaluation and management by telemedicine. The patient expressed understanding and agreed to proceed.  Patient location: home  Provider location: telephone/home     Review of Systems     Cardiac Risk Factors include: advanced age (>7men, >30 women);diabetes mellitus;male gender;family history of premature cardiovascular disease;hypertension;obesity (BMI >30kg/m2)     Objective:    Today's Vitals   There is no height or weight on file to calculate BMI.     07/28/2022    8:44 AM 06/19/2022   10:56 AM 04/03/2022    2:25 PM 03/05/2022    3:00 PM 05/02/2021    3:21 PM 11/25/2020    8:29 AM 11/12/2020    3:52 PM  Advanced Directives  Does Patient Have a Medical Advance Directive? No No No No No No No  Does patient want to make changes to medical advance directive?    No - Patient declined     Would patient like information on creating a medical advance directive? No - Patient declined No - Patient declined No - Patient declined No - Patient declined No - Patient declined No - Patient declined No - Patient declined    Current Medications (verified) Outpatient Encounter Medications as of 07/28/2022  Medication Sig   acetaminophen (TYLENOL) 325 MG tablet Take 2 tablets (650 mg total) by mouth every 6 (six) hours as needed for mild pain (or Fever >/= 101).   atorvastatin (LIPITOR) 80 MG tablet TAKE 1 TABLET BY MOUTH DAILY   augmented betamethasone dipropionate (DIPROLENE-AF) 0.05 % cream APPLY TOPICALLY TO AFFECTED  AREA(S) TWICE DAILY   cetirizine (ZYRTEC) 10 MG tablet Take 10 mg by mouth daily.   cholecalciferol (VITAMIN D3) 25 MCG (1000 UNIT) tablet Take 1  tablet (1,000 Units total) by mouth at bedtime.   diphenhydrAMINE (BENADRYL) 25 mg capsule Take 25 mg by mouth every 6 (six) hours as needed.   enalapril (VASOTEC) 20 MG tablet Take 1 tablet (20 mg total) by mouth at bedtime.   gabapentin (NEURONTIN) 300 MG capsule TAKE 1 CAPSULE BY MOUTH AT  BEDTIME   glucose blood (PRODIGY NO CODING BLOOD GLUC) test strip USE TO CHECK BLOOD SUGAR TWICE  DAILY   hydrochlorothiazide (MICROZIDE) 12.5 MG capsule Take 1 capsule (12.5 mg total) by mouth daily.   insulin glargine (LANTUS SOLOSTAR) 100 UNIT/ML Solostar Pen Inject 13 Units into the skin daily.   Insulin Pen Needle 32G X 4 MM MISC 1 Pen by Does not apply route daily.   metoprolol succinate (TOPROL-XL) 25 MG 24 hr tablet TAKE ONE-HALF TABLET BY MOUTH IN THE EVENING   triamcinolone cream (KENALOG) 0.1 % APPLY TOPICALLY TO AFFECTED  AREA(S) TWICE DAILY AS NEEDED  FOR DRY SKIN   TRULICITY 0.75 MG/0.5ML SOPN INJECT THE CONTENTS OF ONE PEN  SUBCUTANEOUSLY WEEKLY AS  DIRECTED   Prodigy Twist Top Lancets 28G MISC CHECK BLOOD SUGAR 3 TIMES DAILY (Patient not taking: Reported on 04/28/2022)   triamcinolone 0.1% oint-Eucerin equivalent cream 1:1 mixture Apply topically 2 (two) times daily as needed. 1 jar please (Patient not taking: Reported on 07/14/2022)   No facility-administered encounter medications on file as of 07/28/2022.    Allergies (verified) Patient has no  known allergies.   History: Past Medical History:  Diagnosis Date   Allergy    Bacteremia due to Staphylococcus aureus without sepsis 11/17/2020   Blindness - both eyes 1967   Basketball injury   Diabetes mellitus    Gangrene of right foot (HCC)    Hernia    Hyperlipidemia    Hypertension    Left hip pain 04/10/2013   Maggot infestation 08/23/2019   Marfan syndrome    Osteomyelitis of foot, left, acute (HCC) 12/26/2018   Polymicrobial bacterial infection 11/12/2020   Right knee pain 10/04/2017   Past Surgical History:  Procedure  Laterality Date   AMPUTATION Left 12/28/2018   Procedure: LEFT FOOT 3RD RAY AMPUTATION;  Surgeon: Nadara Mustard, MD;  Location: [facility] OR;  Service: Orthopedics;  Laterality: Left;   AMPUTATION Left 01/03/2020   Procedure: LEFT BELOW KNEE AMPUTATION;  Surgeon: Nadara Mustard, MD;  Location: [facility] OR;  Service: Orthopedics;  Laterality: Left;   AMPUTATION Right 11/13/2020   Procedure: RIGHT BELOW KNEE AMPUTATION;  Surgeon: Nadara Mustard, MD;  Location: [facility] OR;  Service: Orthopedics;  Laterality: Right;   APPENDECTOMY     INGUINAL HERNIA REPAIR     Right 1970s,    Left 2011 by Dr Job Founds HERNIA REPAIR  08/25/10   recurrent left   PENILE PROSTHESIS IMPLANT  2000s   [facility], Dr Brunilda Payor   RETINAL DETACHMENT SURGERY  1970   Rotator Cuff Repair  2009   Left side, Dr Elita Quick    ROTATOR CUFF REPAIR  2010-2011?   right side   TEE WITHOUT CARDIOVERSION N/A 11/18/2020   Procedure: TRANSESOPHAGEAL ECHOCARDIOGRAM (TEE);  Surgeon: Christell Constant, MD;  Location: [facility] ENDOSCOPY;  Service: Cardiovascular;  Laterality: N/A;   Family History  Problem Relation Age of Onset   Heart disease Mother    Breast cancer Sister    Social History   Socioeconomic History   Marital status: Married    Spouse name: NYGIL LUTTRULL   Number of children: 0   Years of education: 12th    Highest education level: Not on file  Occupational History   Occupation: industrial sewing    Employer: [employer]  Tobacco Use   Smoking status: Former    Types: Cigarettes    Quit date: 02/14/1984    Years since quitting: 38.4   Smokeless tobacco: Never  Substance and Sexual Activity   Alcohol use: No    Comment: Quit in 10/1983   Drug use: No   Sexual activity: Not Currently  Other Topics Concern   Not on file  Social History Narrative   Lives in [place] with wife, BRESHAWN COLONA   Blind.  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   Exercise: Pt has no regular exercise routine.   Seatbelts: Pt reports wearing seatbelt when in vehicles.    Wynelle Link Exposure/Protection:    Hobbies: Proofreader, walking, sports         Social Determinants of Health   Financial Resource Strain: Low Risk  (07/28/2022)   Overall Financial Resource Strain (CARDIA)  Difficulty of Paying Living Expenses: Not hard at all  Food Insecurity: No Food Insecurity (07/28/2022)   Hunger Vital Sign    Worried About Running Out of Food in the Last Year: Never true    Ran Out of Food in the Last Year: Never true  Transportation Needs: No Transportation Needs (07/28/2022)   PRAPARE - Administrator, Civil Service (Medical): No    Lack of Transportation (Non-Medical): No  Physical Activity: Insufficiently Active (07/28/2022)   Exercise Vital Sign    Days of Exercise per Week: 3 days    Minutes of Exercise per Session: 20 min  Stress: No Stress Concern Present (07/28/2022)   [employer] of Occupational Health - Occupational Stress Questionnaire    Feeling of Stress : Not at all  Social Connections: Moderately Integrated (07/28/2022)   Social Connection and Isolation Panel [NHANES]    Frequency of Communication with Friends and Family: More than three times a week    Frequency of Social Gatherings with Friends and Family: Never    Attends Religious Services: Never    Diplomatic Services operational officer: No    Attends Engineer, structural: More than 4 times per year    Marital Status: Married    Tobacco Counseling Counseling given: Not Answered   Clinical Intake:  Pre-visit preparation completed: Yes  Pain : No/denies pain     Diabetes: Yes CBG done?: No Did pt. bring in CBG  monitor from home?: No  How often do you need to have someone help you when you read instructions, pamphlets, or other written materials from your doctor or pharmacy?: 5 - Always (patient blind,)  Diabetic?  Yes  Nutrition Risk Assessment:  Has the patient had any N/V/D within the last 2 months?  No  Does the patient have any non-healing wounds?  No  Has the patient had any unintentional weight loss or weight gain?  No   Diabetes:  Is the patient diabetic?  No  If diabetic, was a CBG obtained today?  yes Did the patient bring in their glucometer from home?  No  How often do you monitor your CBG's? Has libre.   Financial Strains and Diabetes Management:  Are you having any financial strains with the device, your supplies or your medication? No .  Does the patient want to be seen by Chronic Care Management for management of their diabetes?  No  Would the patient like to be referred to a Nutritionist or for Diabetic Management?  No   Diabetic Exams:  Diabetic Eye Exam:  Patient  is Blind.. A referral has been placed today.   Diabetic Foot Exam:. Patient is a double amputee    Interpreter Needed?: No  Information entered by :: Remi Haggard LPN   Activities of Daily Living    07/28/2022    8:39 AM  In your present state of health, do you have any difficulty performing the following activities:  Hearing? 0  Vision? 1  Comment patient is blind  Difficulty concentrating or making decisions? 0  Walking or climbing stairs? 1  Comment double amputee  Dressing or bathing? 0  Doing errands, shopping? 1  Comment states he takes public transportation to doctors, wife does all Psychologist, clinical and eating ? N  Using the Toilet? N  In the past six months, have you accidently leaked urine? N  Do you have problems with loss of bowel control? N  Managing your Medications? N  Managing your Finances? N  Housekeeping or managing your Housekeeping? Y    Patient Care  Team: Evelena Leyden, DO as PCP - General (Family Medicine) Su Grand, MD (Inactive) (Urology) Carman Ching, MD (Inactive) (Gastroenterology)  Indicate any recent Medical Services you may have received from other than [facility] providers in the past year (date may be approximate).     Assessment:   This is a routine wellness examination for [facility].  Hearing/Vision screen Hearing Screening - Comments:: No trouble hearing Vision Screening - Comments:: Patient is bliind  Dietary issues and exercise activities discussed: Current Exercise Habits: Home exercise routine, Type of exercise: stretching, Time (Minutes): 15, Frequency (Times/Week): 3, Weekly Exercise (Minutes/Week): 45, Intensity: Mild   Goals Addressed             This Visit's Progress    Patient Stated       Current lifestyle continue       Depression Screen    07/28/2022    8:43 AM 06/19/2022   10:55 AM 04/03/2022    2:28 PM 05/02/2021    3:21 PM 11/25/2020    8:29 AM 09/19/2020    9:39 AM 09/26/2019    8:32 AM  PHQ 2/9 Scores  PHQ - 2 Score 0  0 0 0 0 0  PHQ- 9 Score 0  1 0 0 0 0  Exception Documentation  Patient refusal         Fall Risk    07/28/2022    8:44 AM 06/19/2022   10:55 AM 04/03/2022    2:26 PM 11/25/2020    8:29 AM 09/19/2020    9:39 AM  Fall Risk   Falls in the past year? 1 0 0 0 0  Number falls in past yr: 1 0 0 0 0  Injury with Fall? 0 0 0 0 0  Risk for fall due to : Impaired balance/gait;Impaired mobility;History of fall(s)      Follow up Falls evaluation completed;Education provided;Falls prevention discussed        FALL RISK PREVENTION PERTAINING TO THE HOME:  Any stairs in or around the home? No  If so, are there any without handrails? No  Home free of loose throw rugs in walkways, pet beds, electrical cords, etc? Yes  Adequate lighting in your home to reduce risk of falls? Yes   ASSISTIVE DEVICES UTILIZED TO PREVENT FALLS:  Life alert? No  Use of a cane, walker or w/c? Yes  Grab  bars in the bathroom? Yes  Shower chair or bench in shower? Yes  Elevated toilet seat or a handicapped toilet? Yes   TIMED UP AND GO:  Was the test performed? No .    Cognitive Function:    09/18/2011    9:00 AM  MMSE - Mini Mental State Exam  Orientation to time 5  Orientation to Place 5  Registration 3  Attention/ Calculation 4  Recall 3  Language- name 2 objects 2  Language- repeat 1  Language- follow 3 step command 3  Language- read & follow direction 1  Write a sentence 1  Copy design 0  Total score 28        07/28/2022    8:41 AM  6CIT Screen  What Year? 0 points  What month? 0 points  What time? 0 points  Count back from 20 0 points  Months in reverse 0 points  Repeat phrase 0 points  Total Score 0 points    Immunizations Immunization History  Administered Date(s)  Administered   COVID-19, mRNA, vaccine(Comirnaty)12 years and older 03/05/2022   Fluad Quad(high Dose 65+) 11/16/2021   Influenza Split 01/13/2012   Influenza,inj,Quad PF,6+ Mos 11/09/2012, 10/20/2013, 10/18/2015   Influenza-Unspecified 11/17/2014, 11/03/2016, 11/04/2019   PFIZER(Purple Top)SARS-COV-2 Vaccination 05/05/2019, 05/31/2019, 01/08/2020   Pfizer Covid-19 Vaccine Bivalent Booster 60yrs & up 04/17/2021   Pneumococcal Conjugate-13 01/13/2016   Pneumococcal Polysaccharide-23 06/18/2017   Tdap 08/08/2012    TDAP status: Up to date  Flu Vaccine status: Up to date  Pneumococcal vaccine status: Up to date  Covid-19 vaccine status: Information provided on how to obtain vaccines.   Qualifies for Shingles Vaccine? Yes   Zostavax completed No   Shingrix Completed?: No.    Education has been provided regarding the importance of this vaccine. Patient has been advised to call insurance company to determine out of pocket expense if they have not yet received this vaccine. Advised may also receive vaccine at local pharmacy or Health Dept. Verbalized acceptance and understanding.  Screening  Tests Health Maintenance  Topic Date Due   Diabetic kidney evaluation - Urine ACR  Never done   LIPID PANEL  09/19/2021   Diabetic kidney evaluation - eGFR measurement  05/03/2022   Zoster Vaccines- Shingrix (1 of 2) 10/28/2022 (Originally 09/10/1998)   DTaP/Tdap/Td (2 - Td or Tdap) 08/09/2022   INFLUENZA VACCINE  09/17/2022   HEMOGLOBIN A1C  01/14/2023   Medicare Annual Wellness (AWV)  07/28/2023   Colonoscopy  09/10/2026   Pneumonia Vaccine 81+ Years old  Completed   COVID-19 Vaccine  Completed   Hepatitis C Screening  Completed   HPV VACCINES  Aged Out   FOOT EXAM  Discontinued    Health Maintenance  Health Maintenance Due  Topic Date Due   Diabetic kidney evaluation - Urine ACR  Never done   LIPID PANEL  09/19/2021   Diabetic kidney evaluation - eGFR measurement  05/03/2022    Colorectal cancer screening: Type of screening: Colonoscopy. Completed 2008. Repeat every 10 years  Lung Cancer Screening: (Low Dose CT Chest recommended if Age 90-80 years, 30 pack-year currently smoking OR have quit w/in 15years.) does not qualify.   Lung Cancer Screening Referral:   Additional Screening:  Hepatitis C Screening: does not qualify; Completed 2015  Vision Screening: Recommended annual ophthalmology exams for early detection of glaucoma and other disorders of the eye. Is the patient up to date with their annual eye exam?  No  Who is the provider or what is the name of the office in which the patient attends annual eye exams? Patient is Blind If pt is not established with a provider, would they like to be referred to a provider to establish care? No .   Dental Screening: Recommended annual dental exams for proper oral hygiene  Community Resource Referral / Chronic Care Management: CRR required this visit?  No   CCM required this visit?  No      Plan:     I have personally reviewed and noted the following in the patient's chart:   Medical and social history Use of  alcohol, tobacco or illicit drugs  Current medications and supplements including opioid prescriptions. Patient is not currently taking opioid prescriptions. Functional ability and status Nutritional status Physical activity Advanced directives List of other physicians Hospitalizations, surgeries, and ER visits in previous 12 months Vitals Screenings to include cognitive, depression, and falls Referrals and appointments  In addition, I have reviewed and discussed with patient certain preventive protocols, quality metrics, and best practice  recommendations. A written personalized care plan for preventive services as well as general preventive health recommendations were provided to patient.     Remi Haggard, LPN   [id number]/82/9562   Nurse Notes:

## 2022-07-31 ENCOUNTER — Ambulatory Visit (INDEPENDENT_AMBULATORY_CARE_PROVIDER_SITE_OTHER): Payer: Medicare Other | Admitting: Family Medicine

## 2022-07-31 ENCOUNTER — Encounter: Payer: Self-pay | Admitting: Family Medicine

## 2022-07-31 VITALS — BP 147/75 | HR 67 | Ht 77.0 in

## 2022-07-31 DIAGNOSIS — I10 Essential (primary) hypertension: Secondary | ICD-10-CM

## 2022-07-31 DIAGNOSIS — E1149 Type 2 diabetes mellitus with other diabetic neurological complication: Secondary | ICD-10-CM | POA: Diagnosis not present

## 2022-07-31 DIAGNOSIS — E785 Hyperlipidemia, unspecified: Secondary | ICD-10-CM

## 2022-07-31 MED ORDER — LANTUS SOLOSTAR 100 UNIT/ML ~~LOC~~ SOPN
10.0000 [IU] | PEN_INJECTOR | Freq: Every day | SUBCUTANEOUS | 3 refills | Status: DC
Start: 2022-07-31 — End: 2022-10-14

## 2022-07-31 NOTE — Patient Instructions (Signed)
It was nice to see you today!   Your goal blood sugar is 80-130 before eating and less than 180 after eating.   Medication Changes:   Decrease Insulin Glargine (Lantus) to 10 units once daily.    Your next appointment with Dr. Raymondo Band is on Tuesday, August 18, 2022 at 9:30 AM.    Monitor blood sugars at home and keep a log (glucometer or piece of paper) to bring with you to your next visit.   Keep up the good work with diet and exercise. Aim for a diet full of vegetables, fruit and lean meats (chicken, [place], fish). Try to limit salt intake by eating fresh or frozen vegetables (instead of canned), rinse canned vegetables prior to cooking and do not add any additional salt to meals.

## 2022-07-31 NOTE — Assessment & Plan Note (Signed)
BP slightly elevated in the clinic at 147/75.  Patient measures his blood pressure at home and they are normotensive, at this time we will continue on the current manage as listed below. - Continue enalapril 20 mg daily - Continue metoprolol 12.5 mg daily - Continue HCTZ 25 mg daily - BMP today

## 2022-07-31 NOTE — Assessment & Plan Note (Signed)
-   Continue atorvastatin 80 mg daily - Lipid panel today

## 2022-07-31 NOTE — Progress Notes (Signed)
    SUBJECTIVE:   CHIEF COMPLAINT / HPI:   T2DM - Checking BG at home: Has CGM and is LIBERATE trial - Medications: Trulicity 0.75 mg weekly, glargine 13 units daily, atorvastatin 80 mg daily - Compliance: Reviewed - foot exam: N/A - microalbumin: ordered today - Reports hypoglycemic episodes daily per CGM with early morning sugars in the 60s, he would like to go back to the 10 Units of Lantus he was previously on   HTN: - Medications: Enalapril 20 mg daily, metoprolol 12.5 mg daily, HCTZ 12.5 mg daily - Compliance: Good - Checking BP at home: 120s/70s - Denies any SOB, CP, vision changes, LE edema, medication SEs, or symptoms of hypotension   PERTINENT  PMH / PSH: T2DM, HTN, HLD, blindness, b/l BKA  OBJECTIVE:   BP (!) 147/75   Pulse 67   Ht 6\' 5"  (1.956 m)   SpO2 100%   BMI 24.55 kg/m   Gen: well-appearing, NAD CV: RRR, no m/r/g appreciated, no peripheral edema Pulm: CTAB, no wheezes/crackles MSK: b/l BKA with prostheses on, sitting in wheelchair in clinic  ASSESSMENT/PLAN:   DM (diabetes mellitus), type 2 with neurological complications (HCC) Patient's most recent A1c was 6.1, which is significantly improved from the previous 13.  Patient has been having daily hypoglycemic episodes and reports that he would like to go back to taking 10 units daily and see how he does since he had significant improvement in his sugars. - Continue Trulicity 0.5 mg weekly - Decrease Lantus (glargine) to 10 units daily in the morning - Patient instructed to call if continued hypoglycemic events over the next week - Urine microalbumin/creatinine ratio today - Has follow-up with pharmacy team on July 2  Hypertension BP slightly elevated in the clinic at 147/75.  Patient measures his blood pressure at home and they are normotensive, at this time we will continue on the current manage as listed below. - Continue enalapril 20 mg daily - Continue metoprolol 12.5 mg daily - Continue HCTZ 25  mg daily - BMP today  Hyperlipidemia - Continue atorvastatin 80 mg daily - Lipid panel today     Evelena Leyden, DO [facility]

## 2022-07-31 NOTE — Assessment & Plan Note (Addendum)
Patient's most recent A1c was 6.1, which is significantly improved from the previous 13.  Patient has been having daily hypoglycemic episodes and reports that he would like to go back to taking 10 units daily and see how he does since he had significant improvement in his sugars. - Continue Trulicity 0.5 mg weekly - Decrease Lantus (glargine) to 10 units daily in the morning - Patient instructed to call if continued hypoglycemic events over the next week - Urine microalbumin/creatinine ratio today - Has follow-up with pharmacy team on July 2

## 2022-08-01 LAB — BASIC METABOLIC PANEL
BUN/Creatinine Ratio: 19 (ref 10–24)
BUN: 21 mg/dL (ref 8–27)
CO2: 25 mmol/L (ref 20–29)
Calcium: 9.5 mg/dL (ref 8.6–10.2)
Chloride: 105 mmol/L (ref 96–106)
Creatinine, Ser: 1.13 mg/dL (ref 0.76–1.27)
Glucose: 88 mg/dL (ref 70–99)
Potassium: 4.3 mmol/L (ref 3.5–5.2)
Sodium: 144 mmol/L (ref 134–144)
eGFR: 69 mL/min/{1.73_m2} (ref >59)

## 2022-08-01 LAB — MICROALBUMIN / CREATININE URINE RATIO
Creatinine, Urine: 75.4 mg/dL
Microalb/Creat Ratio: 144 mg/g creat — ABNORMAL HIGH (ref 0–29)
Microalbumin, Urine: 108.7 ug/mL

## 2022-08-01 LAB — LIPID PANEL
Chol/HDL Ratio: 2 ratio (ref 0.0–5.0)
Cholesterol, Total: 112 mg/dL (ref 100–199)
HDL: 57 mg/dL (ref >39)
LDL Chol Calc (NIH): 36 mg/dL (ref 0–99)
Triglycerides: 100 mg/dL (ref 0–149)
VLDL Cholesterol Cal: 19 mg/dL (ref 5–40)

## 2022-08-11 ENCOUNTER — Telehealth: Payer: Self-pay

## 2022-08-11 NOTE — Telephone Encounter (Signed)
Patient calls nurse line regarding Freestyle Libre 3 sensor.   He replaced sensor ~2 days ago. Reports that he has not had any readings in the last 24 hours. States that the device has "lost connection."  Forwarding to Dr. Raymondo Band for further assistance.   Veronda Prude, RN

## 2022-08-12 NOTE — Telephone Encounter (Signed)
Contacted patient for signal loss from CGM sensor.   Patient had appropriately replaced malfunctioning sensor with new sensor.  He reported that the newly placed sensor was working.  We discussed of the sensor and I thanked him for appropriately problem solving prior to my call back.   He has adequate sensor supply until his return with me next week.

## 2022-08-12 NOTE — Telephone Encounter (Signed)
Reviewed and agree with Dr Koval's plan.   

## 2022-08-13 ENCOUNTER — Telehealth: Payer: Self-pay

## 2022-08-13 NOTE — Telephone Encounter (Signed)
Patient calls nurse line regarding Trulicity prescription. Patient reports that pharmacy needs provider "authorization."   Called pharmacy. They report no Berkley Harvey is needed, however, patient co-pay for medication is $679 for 90 day supply, $246 for 30 day supply.   Pharmacist advised that patient may be in donut gap with Medicare plan.   Advised patient that I would send message to pharmacy team for further assistance.   Veronda Prude, RN

## 2022-08-18 ENCOUNTER — Other Ambulatory Visit (HOSPITAL_COMMUNITY): Payer: Self-pay

## 2022-08-18 ENCOUNTER — Ambulatory Visit: Payer: Medicare Other | Admitting: Pharmacist

## 2022-08-18 ENCOUNTER — Encounter: Payer: Self-pay | Admitting: Pharmacist

## 2022-08-18 VITALS — BP 128/68 | HR 72 | Wt 210.2 lb

## 2022-08-18 DIAGNOSIS — I10 Essential (primary) hypertension: Secondary | ICD-10-CM

## 2022-08-18 DIAGNOSIS — E1149 Type 2 diabetes mellitus with other diabetic neurological complication: Secondary | ICD-10-CM

## 2022-08-18 NOTE — Progress Notes (Signed)
Reviewed and agree with Dr Koval's plan.   

## 2022-08-18 NOTE — Assessment & Plan Note (Signed)
Hypertension longstanding currently reported well controlled at home with readings 110-120/60-70 without any symptoms. Blood pressure goal of <130 mmHg. Medication adherence excellent.  -Continued enalapril 20mg  and hydrochlorothiazide 12.5 mg. - Consider combination pill at next visit.

## 2022-08-18 NOTE — Assessment & Plan Note (Signed)
Diabetes longstanding and currently doing very well. Patient reports no health complaint and desires no change to treatment regimen. . Patient is able to verbalize appropriate hypoglycemia management plan. Medication adherence appears excellent.  -Continued basal insulin Lantus (insulin glargine) at 10 units daily.  -Continued GLP-1 Trulicity (dulaglutide) at 0.75mg  weekly.   -Patient educated on purpose, proper use, and potential adverse effects.  -Extensively discussed pathophysiology of diabetes, recommended lifestyle interventions, dietary effects on blood sugar control.  -Counseled on s/sx of and management of hypoglycemia.  -Next A1c anticipated at 6 month Liberate visit (09/2022).

## 2022-08-18 NOTE — Progress Notes (Signed)
S:     Chief Complaint  Patient presents with   Medication Management    Daibetes - CGM    72 y.o. male who presents for diabetes evaluation, education, and management. Patient arrives in good spirits and presents with assistance of a wheelchair. Patient is blind and unaccompanied by anyone.   Patient was referred and last seen by Primary Care Provider, Dr. Clayborne Artist, on 07/31/2022.   PMH is significant for Hypertension, Hyperlipidemia, Diabetes, blindness, double amputee, .  At last visit, Dr. Clayborne Artist reduced long-acting lantus insulin to 10 units daily.   Current diabetes medications include: Trulicity 0.75 mg weekly, insulin glargine (Lantus) 10 units daily Current hypertension medications include: enalapril (Vasotec) 20 mg daily, metoprolol Succinate SR (Toprol-XL) 12.5 mg daily, hydrochlorothiazide (Microzide) 12.5 mg daily Current hyperlipidemia medications include: atorvastatin 80 mg daily  Patient reports adherence to taking all medications as prescribed.    Do you feel that your medications are working for you? Yes - desires no change in therapy   Patient denies any symptomatic hypoglycemic events.  Stetes that CGM has helped alert him to low glucose prior to any symptoms.    O:   Review of Systems  All other systems reviewed and are negative.   Physical Exam Vitals reviewed.  Constitutional:      Appearance: Normal appearance.  Pulmonary:     Effort: Pulmonary effort is normal.  Neurological:     Mental Status: He is alert.  Psychiatric:        Mood and Affect: Mood normal.        Behavior: Behavior normal.        Thought Content: Thought content normal.        Judgment: Judgment normal.      Libre 3 CGM Download:  % Time CGM is active: 93%% Average Glucose: 125 mg/dL Glucose Management Indicator: 6.3  Glucose Variability: 22.4 (goal <36%) Time in Goal:  - Time in range 70-180: 96% - Time above range: 4% - Time below range: 0% Observed patterns:   Only rare readings < 70 without symptoms.    Lab Results  Component Value Date   HGBA1C 6.1 07/14/2022   Vitals:   08/18/22 1024 08/18/22 1032  BP: (!) 143/66 128/68  Pulse: 72     Lipid Panel     Component Value Date/Time   CHOL 112 07/31/2022 1035   TRIG 100 07/31/2022 1035   HDL 57 07/31/2022 1035   CHOLHDL 2.0 07/31/2022 1035   CHOLHDL 2.4 05/17/2015 1519   VLDL 19 05/17/2015 1519   LDLCALC 36 07/31/2022 1035    A/P: Diabetes longstanding and currently doing very well. Patient reports no health complaint and desires no change to treatment regimen. . Patient is able to verbalize appropriate hypoglycemia management plan. Medication adherence appears excellent.  -Continued basal insulin Lantus (insulin glargine) at 10 units daily.  -Continued GLP-1 Trulicity (dulaglutide) at 0.75mg  weekly.   -Patient educated on purpose, proper use, and potential adverse effects.  -Extensively discussed pathophysiology of diabetes, recommended lifestyle interventions, dietary effects on blood sugar control.  -Counseled on s/sx of and management of hypoglycemia.  -Next A1c anticipated at 6 month Liberate visit (09/2022).   Hypertension longstanding currently reported well controlled at home with readings 110-120/60-70 without any symptoms. Blood pressure goal of <130 mmHg. Medication adherence excellent.  -Continued enalapril 20mg  and hydrochlorothiazide 12.5 mg. - Consider combination pill at next visit.   Written patient instructions provided. Patient verbalized understanding of treatment plan.  Total time in face to face counseling 32 minutes.    Follow-up:  Pharmacist 8 weeks (end of 6 month liberate study). PCP clinic visit in PRN.

## 2022-08-18 NOTE — Patient Instructions (Signed)
It was nice to see you today!  Your goal blood sugar is 80-130 before eating and less than 180 after eating.  Medication Changes: Continue All medications the same    Keep up the good work with diet and exercise. Aim for a diet full of vegetables, fruit and lean meats (chicken, [place], fish). Try to limit salt intake by eating fresh or frozen vegetables (instead of canned), rinse canned vegetables prior to cooking and do not add any additional salt to meals.

## 2022-09-07 ENCOUNTER — Other Ambulatory Visit: Payer: Self-pay

## 2022-09-07 MED ORDER — HYDROCHLOROTHIAZIDE 12.5 MG PO CAPS: 12.5000 mg | ORAL_CAPSULE | Freq: Every day | ORAL | 0 refills | Status: DC

## 2022-09-08 ENCOUNTER — Other Ambulatory Visit: Payer: Self-pay

## 2022-10-14 ENCOUNTER — Encounter: Payer: Self-pay | Admitting: Pharmacist

## 2022-10-14 ENCOUNTER — Encounter (INDEPENDENT_AMBULATORY_CARE_PROVIDER_SITE_OTHER): Payer: Medicare Other | Admitting: Pharmacist

## 2022-10-14 VITALS — BP 123/61 | HR 74 | Wt 213.0 lb

## 2022-10-14 DIAGNOSIS — E1149 Type 2 diabetes mellitus with other diabetic neurological complication: Secondary | ICD-10-CM

## 2022-10-14 DIAGNOSIS — I10 Essential (primary) hypertension: Secondary | ICD-10-CM

## 2022-10-14 DIAGNOSIS — E1142 Type 2 diabetes mellitus with diabetic polyneuropathy: Secondary | ICD-10-CM

## 2022-10-14 LAB — POCT GLYCOSYLATED HEMOGLOBIN (HGB A1C): HbA1c, POC (controlled diabetic range): 5.9 % (ref 0.0–7.0)

## 2022-10-14 MED ORDER — LANTUS SOLOSTAR 100 UNIT/ML ~~LOC~~ SOPN
5.0000 [IU] | PEN_INJECTOR | Freq: Every day | SUBCUTANEOUS | Status: DC
Start: 2022-10-14 — End: 2023-05-10

## 2022-10-14 MED ORDER — GABAPENTIN 300 MG PO CAPS
300.0000 mg | ORAL_CAPSULE | Freq: Three times a day (TID) | ORAL | 3 refills | Status: DC
Start: 2022-10-14 — End: 2023-08-17

## 2022-10-14 MED ORDER — ENALAPRIL MALEATE 10 MG PO TABS
10.0000 mg | ORAL_TABLET | Freq: Every day | ORAL | 3 refills | Status: DC
Start: 2022-10-14 — End: 2023-01-11

## 2022-10-14 MED ORDER — BETAMETHASONE DIPROPIONATE AUG 0.05 % EX CREA: TOPICAL_CREAM | Freq: Two times a day (BID) | CUTANEOUS | 2 refills | Status: DC

## 2022-10-14 MED ORDER — FREESTYLE LIBRE 3 PLUS SENSOR MISC
1.0000 | 11 refills | Status: DC
Start: 2022-10-14 — End: 2022-10-22

## 2022-10-14 NOTE — Assessment & Plan Note (Signed)
LIBERATE Study:  - Patient is on insulin, so insurance should cover CGM sensors. Sent prescription for [facility] 3 Plus sensors.   Diabetes longstanding currently controlled. Patient is able to verbalize appropriate hypoglycemia management plan. Medication adherence appears excellent. Tight glucose control of A1c <6.5 is not necessary and undesirable as it increases risk of hypoglycemia and falls. Patient endorses uncontrolled neuropathy. - Decreased dose of basal insulin Lantus (insulin glargine) from 10 units to 5 units once daily in the morning.  - Continued GLP-1 Trulicity (generic dulaglutide) 0.75mg  once weekly.  - Increased gabapentin to 300mg  three times daily. -Patient educated on purpose, proper use, and potential adverse effects.

## 2022-10-14 NOTE — Patient Instructions (Signed)
It was nice to see you today!  Your goal blood sugar is 80-130 before eating and less than 180 after eating.  Medication Changes: Increase gabapentin to 300mg  three times daily.  Decrease enalapril to 10mg  once daily.  Decrease Lantus (insuln glargine) to 5 units once daily.  Restart betamethasone dipropionate cream to affected skin twice daily as needed.  Continue all other medication the same.  Keep up the good work with diet and exercise. Aim for a diet full of vegetables, fruit and lean meats (chicken, [place], fish). Try to limit salt intake by eating fresh or frozen vegetables (instead of canned), rinse canned vegetables prior to cooking and do not add any additional salt to meals.

## 2022-10-14 NOTE — Research (Signed)
S:     Chief Complaint  Patient presents with   Medication Management    Diabetes Liberate 6 Month Follow Up   72 y.o. male who presents for diabetes evaluation, education, and management in the context of the LIBERATE Study. Today, patient arrives in good spirits and presents with assistance of a wheelchair. Patient is blind and unaccompanied by anyone.  Patient was referred and last seen by Primary Care Provider, Dr. Clayborne Artist, on 07/31/2022.  Patient was last seen in pharmacy clinic on 08/18/22.   PMH is significant for Hypertension, Hyperlipidemia, Diabetes, blindness, bilateral lower leg amputee.   Current diabetes medications include: Trulicity (dulaglutide) 0.75 mg once weekly, insulin glargine (Lantus) 10 units daily Current hypertension medications include: enalapril 20 mg daily, metoprolol succinate SR (Toprol-XL) 12.5 mg daily, hydrochlorothiazide 12.5 mg daily Current hyperlipidemia medications include: atorvastatin 80 mg daily  Patient reports adherence to taking all medications as prescribed.  Do you feel that your medications are working for you? Yes  Have you been experiencing any side effects to the medications prescribed? Yes - patient endorses home blood pressures ranging from 90s-110s/ 60s-70s with dizziness  Insurance coverage: [employer]  Patient denies hypoglycemic events. CGM Study Study visit: 6 Month Follow Up Visit  CGM Data Download date: 10/01/22 % Time CGM Is Active: 96 % Average glucose (mg/dL): 400 mg/dL Glucose Management Indicator (%): 6.6 % Glucose Variability (%): 22.9 % Time Above Range >180 mg/dL (%): 10 % Time in Range 70-180 mg/dL (%): 90 % Time Below Range <70 mg/dL (%): 0 %  Diabetes Distress Scale Feeling like diabetes is taking up too much of my mental and physical energy every day.: Not a problem Feeling that my doctor doesn't know enough about diabetes and diabetic care. : Not a problem Feeling angry, scared,  and/or depressed when I think about living with diabetes : Not a problem Feeling that my doctor doesn't give my clear directions on how to manage my diabetes. : Not a problem Feeling that im not testing my blood sugars frequently enough.: Not a problem Feeling that I'm often failing with my diabetes routine: A slight problem Feelling that friends and family are not supportive enough of self care efforts.: A slight problem Feeling that diabetes controls my life.: Not a problem Feeling that my doctor doesn't take my concerns seriously enough: Not a problem Not feeling confident in my day to day ability to manage diabetes: Not a problem Feeling that I will end up with serious long term complications no matter what I do.: A slight problem Feeling that I am not sticking closely enough to a good meal plan.: A moderate problem Feeling that friends or family don't appreciate how difficult living with diabetes can be. : A slight problem Feeling overwhelmed by the demands of living with diabetes.: Not a problem Feeling that I don't have a doctor who I can see.: Not a problem Not feeling motivated to keep up my diabetes self management.: Not a problem Feeling that friends or family don't give me the emotional support that I would like. : A slight problem DDS17 Score: 24 Emotional Burden Score: 1.2 Physician related distress score: 1 Regimen Related Distress score : 1.6 Interpersonal distress score: 2   Patient reports nocturia (nighttime urination), but says this has occurred his entire life and has been better recently. Patient reports neuropathy (nerve pain).  O:   Review of Systems  Skin:  Positive for rash (examined during visit by Dr. Miquel Dunn).  Neurological:  Positive for tingling.  All other systems reviewed and are negative.   Physical Exam Vitals reviewed.  Constitutional:      Appearance: Normal appearance.  Pulmonary:     Effort: Pulmonary effort is normal.  Neurological:      Mental Status: He is alert.  Psychiatric:        Mood and Affect: Mood normal.        Behavior: Behavior normal.        Thought Content: Thought content normal.        Judgment: Judgment normal.     Lab Results  Component Value Date   HGBA1C 5.9 10/14/2022    Vitals:   10/14/22 0948  BP: 123/61  Pulse: 74  SpO2: 100%     Lipid Panel     Component Value Date/Time   CHOL 112 07/31/2022 1035   TRIG 100 07/31/2022 1035   HDL 57 07/31/2022 1035   CHOLHDL 2.0 07/31/2022 1035   CHOLHDL 2.4 05/17/2015 1519   VLDL 19 05/17/2015 1519   LDLCALC 36 07/31/2022 1035   A/P:  LIBERATE Study:  - Patient is on insulin, so insurance should cover CGM sensors. Sent prescription for [facility] 3 Plus sensors.   Diabetes longstanding currently controlled. Patient is able to verbalize appropriate hypoglycemia management plan. Medication adherence appears excellent. Tight glucose control of A1c <6.5 is not necessary and undesirable as it increases risk of hypoglycemia and falls. Patient endorses uncontrolled neuropathy. - Decreased dose of basal insulin Lantus (insulin glargine) from 10 units to 5 units once daily in the morning.  - Continued GLP-1 Trulicity (generic dulaglutide) 0.75mg  once weekly.  - Increased gabapentin to 300mg  three times daily. -Patient educated on purpose, proper use, and potential adverse effects.  -Extensively discussed pathophysiology of diabetes, recommended lifestyle interventions, dietary effects on blood sugar control.  -Counseled on s/sx of and management of hypoglycemia.  -Next A1c anticipated 3 months (end of November 2024).   Hypertension longstanding currently controlled with symptoms of hypotension. Blood pressure goal of <130 mmHg. Medication adherence optimal. - Decrease dose of enalapril from 20mg  daily to 10mg  daily  Atopic dermatitis acute flare. Dr. Miquel Dunn examined patient and gave verbal authorization to renew patient's prior prescription for  betamethasone cream. - Re-start betamethasone dipropionate cream topically to affected areas twice daily as needed.  Written patient instructions provided. Patient verbalized understanding of treatment plan.  Total time in face to face counseling 34 minutes.    Follow-up:  Pharmacist: November-December. PCP clinic visit 11/18/22 .  Patient seen with Rickey Primus, PharmD Candidate and Andee Poles, PharmD Candidate. Marland Kitchen

## 2022-10-14 NOTE — Assessment & Plan Note (Signed)
Hypertension longstanding currently controlled with symptoms of hypotension. Blood pressure goal of <130 mmHg. Medication adherence optimal. - Decrease dose of enalapril from 20mg  daily to 10mg  daily

## 2022-10-16 ENCOUNTER — Other Ambulatory Visit: Payer: Self-pay | Admitting: Family Medicine

## 2022-10-16 ENCOUNTER — Telehealth: Payer: Self-pay

## 2022-10-16 DIAGNOSIS — E1149 Type 2 diabetes mellitus with other diabetic neurological complication: Secondary | ICD-10-CM

## 2022-10-16 NOTE — Progress Notes (Signed)
Reviewed and agree with Dr Koval's plan.   

## 2022-10-16 NOTE — Telephone Encounter (Signed)
Patient calls nurse line in regards to [employer].   He reports the pharmacy in unable to process prescription.   Will forward to pharmacy team.

## 2022-10-21 ENCOUNTER — Other Ambulatory Visit (HOSPITAL_COMMUNITY): Payer: Self-pay

## 2022-10-22 ENCOUNTER — Telehealth: Payer: Self-pay | Admitting: *Deleted

## 2022-10-22 DIAGNOSIS — E1149 Type 2 diabetes mellitus with other diabetic neurological complication: Secondary | ICD-10-CM

## 2022-10-22 MED ORDER — FREESTYLE LIBRE 3 SENSOR MISC: 11 refills | Status: DC

## 2022-10-22 NOTE — Telephone Encounter (Signed)
Received refill request for freestyle libre 3 sensor.  Contacted [employer]um as we sent this in on 10/14/22.  The issue is that we sent in the Sensor PLUS and that is not available.  Can the regular Freestyle 3 sensor be called in?  Jone Baseman, CMA

## 2022-10-23 MED ORDER — FREESTYLE LIBRE 3 SENSOR MISC
1.0000 | 11 refills | Status: AC
Start: 2022-10-23 — End: ?

## 2022-10-23 NOTE — Addendum Note (Signed)
Addended byPerley Jain, Daiya Tamer D on: 10/23/2022 10:34 AM   Modules accepted: Orders

## 2022-10-23 NOTE — Telephone Encounter (Signed)
Sent to [employer] 3 sensor misc

## 2022-11-02 ENCOUNTER — Telehealth: Payer: Self-pay

## 2022-11-02 NOTE — Telephone Encounter (Signed)
Patient calls nurse line due to issues in getting freestyle sensors. He reports that [facility] is out of stock, unsure when they will be able to send out shipment.   He is asking if message can be sent to Dr. Raymondo Band with request for samples.   Please advise.   Veronda Prude, RN

## 2022-11-03 ENCOUNTER — Telehealth: Payer: Self-pay | Admitting: Pharmacist

## 2022-11-03 NOTE — Telephone Encounter (Signed)
Patient contacted for follow-up of Libre 3 sensors.  Since last contact, patient reports issues in getting freestyle sensors. He reports that [facility] is out of stock and is unsure of when they will be able to send out a shipment of sensors.   Called patient to Inform him that there are there 2 Libre 3 sensors we are providing for him to pick up at the front desk.  Total time with patient call and documentation of interaction: 5 minutes.

## 2022-11-17 NOTE — Progress Notes (Unsigned)
    SUBJECTIVE:   CHIEF COMPLAINT / HPI: Diabetes/HTN  T2DM w/ Neuropathy Last A1c 5.9 in 8/24. Current regimen includes Insulin glargine 5 units daily, Trulicity 0.75 weekly, and Gabapentin 300 mg TID. Well controlled.   HTN Current regimen includes Enalapril 10 mg daily, HCTZ 12.5 mg and Metop-XL 25 daily. BP at home 124/59 and 122/67 this morning. Reports that he is usually has higher pressures here.  Has seen Dr. Raymondo Band for his medication management and has changes were made at his last visit, new dosing listed above.  R shoulder arthritis Having some R shoulder arthritic pain occasionally and would like to know if there are any medications to help improve this. Discussed some OTC options such as Tylenol arthritis and Voltaren gel.  PERTINENT  PMH / PSH: T2DM w/ neuropathy, HLD, HTN  OBJECTIVE:   BP (!) 146/58   Pulse 62   Ht 6\' 5"  (1.956 m)   SpO2 100%   BMI 25.26 kg/m   General: Awake and Alert in NAD HEENT: Normocephalic, atraumatic. Conjunctiva normal. No nasal discharge Cardiovascular: RRR. No M/R/G Respiratory: CTAB, normal WOB on RA. No wheezing, crackles, rhonchi, or diminished breath sounds. Abdomen: Soft, non-tender, non-distended. Bowel sounds normoactive Extremities: No BLE edema, no deformities or significant joint findings. Skin: Warm and dry. Neuro: A&Ox3. No focal neurological deficits.  ASSESSMENT/PLAN:   Hypertension Continue medications as prescribed by Dr. Raymondo Band. Advised patient to bring in his BP cuff at his next visit to compare it with our office machines. - Will continue current medication regimen - Follow up w/ Dr. Raymondo Band 11/25, BMP at this visit  Arthritis Discussed R shoulder arthritis pain.  - Advised shoulder movement as much as possible - Attached information about arthritis to AVS - Discussed NSAIDs vs Tylenol vs topical Voltaren gel vs PT as options - pt would like to try Voltaren gel at this time.  DM (diabetes mellitus), type 2 with  neurological complications (HCC) Well controlled. A1c in 08/24 at 5.9. Medications were adjusted by Dr. Raymondo Band at last visit. - Will continue current regimen - Follow up w/ Dr. Raymondo Band 11/25 for repeat A1c - Refilled Freestyle Libre per pt request for 3 months   Fortunato Curling, DO [facility]

## 2022-11-18 ENCOUNTER — Ambulatory Visit: Payer: Medicare Other | Admitting: Family Medicine

## 2022-11-18 ENCOUNTER — Encounter: Payer: Self-pay | Admitting: Family Medicine

## 2022-11-18 VITALS — BP 146/58 | HR 62 | Ht 77.0 in

## 2022-11-18 DIAGNOSIS — Z7985 Long-term (current) use of injectable non-insulin antidiabetic drugs: Secondary | ICD-10-CM | POA: Diagnosis not present

## 2022-11-18 DIAGNOSIS — Z23 Encounter for immunization: Secondary | ICD-10-CM

## 2022-11-18 DIAGNOSIS — I10 Essential (primary) hypertension: Secondary | ICD-10-CM

## 2022-11-18 DIAGNOSIS — E1142 Type 2 diabetes mellitus with diabetic polyneuropathy: Secondary | ICD-10-CM | POA: Diagnosis not present

## 2022-11-18 DIAGNOSIS — M1990 Unspecified osteoarthritis, unspecified site: Secondary | ICD-10-CM

## 2022-11-18 DIAGNOSIS — E1149 Type 2 diabetes mellitus with other diabetic neurological complication: Secondary | ICD-10-CM

## 2022-11-18 MED ORDER — FREESTYLE LIBRE 3 SENSOR MISC
1.0000 | 3 refills | Status: DC
Start: 2022-11-18 — End: 2023-01-08

## 2022-11-18 MED ORDER — DICLOFENAC SODIUM 1 % EX GEL
2.0000 g | Freq: Four times a day (QID) | CUTANEOUS | 0 refills | Status: DC | PRN
Start: 2022-11-18 — End: 2022-12-14

## 2022-11-18 NOTE — Patient Instructions (Addendum)
It was great to see you today! Thank you for choosing [facility] for your primary care. Chad Hall was seen for DM/BP check.  Today we addressed: BP - continue current med regimen. Please bring your blood pressure monitor to your next visit. Follow up next month for labs. Diabetes - continue current med regimen. Will follow up next month for repeat A1c.  Arthritis - prescribed Voltaren gel to apply to R shoulder when pain flares Vaccines - received flu and COVID vaccines today  You should return to our clinic Return in about 4 weeks (around 12/16/2022). Please arrive 15 minutes before your appointment to ensure smooth check in process.  We appreciate your efforts in making this happen.  Thank you for allowing me to participate in your care, Fortunato Curling, DO 11/18/2022, 10:16 AM PGY-1, [facility]

## 2022-11-18 NOTE — Assessment & Plan Note (Addendum)
Well controlled. A1c in 08/24 at 5.9. Medications were adjusted by Dr. Raymondo Band at last visit. - Will continue current regimen - Follow up w/ Dr. Raymondo Band 11/25 for repeat A1c - Refilled Freestyle Libre per pt request for 3 months

## 2022-11-18 NOTE — Assessment & Plan Note (Deleted)
Well controlled. A1c in 08/24 at 5.9. Medications were adjusted by Dr. Raymondo Band at last visit.

## 2022-11-18 NOTE — Assessment & Plan Note (Addendum)
Discussed R shoulder arthritis pain.  - Advised shoulder movement as much as possible - Attached information about arthritis to AVS - Discussed NSAIDs vs Tylenol vs topical Voltaren gel vs PT as options - pt would like to try Voltaren gel at this time.

## 2022-11-18 NOTE — Assessment & Plan Note (Addendum)
Continue medications as prescribed by Dr. Raymondo Band. Advised patient to bring in his BP cuff at his next visit to compare it with our office machines. - Will continue current medication regimen - Follow up w/ Dr. Raymondo Band 11/25, BMP at this visit

## 2022-11-24 ENCOUNTER — Other Ambulatory Visit: Payer: Self-pay | Admitting: Family Medicine

## 2022-12-11 ENCOUNTER — Other Ambulatory Visit: Payer: Self-pay | Admitting: Family Medicine

## 2022-12-11 DIAGNOSIS — M1990 Unspecified osteoarthritis, unspecified site: Secondary | ICD-10-CM

## 2023-01-07 ENCOUNTER — Other Ambulatory Visit: Payer: Self-pay

## 2023-01-07 MED ORDER — PRODIGY TWIST TOP LANCETS 28G MISC: 3 refills | Status: DC

## 2023-01-08 ENCOUNTER — Other Ambulatory Visit: Payer: Self-pay

## 2023-01-08 DIAGNOSIS — E1149 Type 2 diabetes mellitus with other diabetic neurological complication: Secondary | ICD-10-CM

## 2023-01-08 MED ORDER — FREESTYLE LIBRE 3 SENSOR MISC
1.0000 | 3 refills | Status: DC
Start: 2023-01-08 — End: 2023-06-25

## 2023-01-11 ENCOUNTER — Ambulatory Visit (INDEPENDENT_AMBULATORY_CARE_PROVIDER_SITE_OTHER): Payer: Medicare Other | Admitting: Pharmacist

## 2023-01-11 ENCOUNTER — Encounter: Payer: Self-pay | Admitting: Pharmacist

## 2023-01-11 VITALS — BP 155/72 | HR 62 | Wt 223.6 lb

## 2023-01-11 DIAGNOSIS — E1149 Type 2 diabetes mellitus with other diabetic neurological complication: Secondary | ICD-10-CM | POA: Diagnosis not present

## 2023-01-11 DIAGNOSIS — I10 Essential (primary) hypertension: Secondary | ICD-10-CM | POA: Diagnosis not present

## 2023-01-11 DIAGNOSIS — E785 Hyperlipidemia, unspecified: Secondary | ICD-10-CM | POA: Diagnosis not present

## 2023-01-11 MED ORDER — ENALAPRIL MALEATE 10 MG PO TABS: 20.0000 mg | ORAL_TABLET | Freq: Every day | ORAL | Status: DC

## 2023-01-11 NOTE — Progress Notes (Signed)
Reviewed and agree with Dr Koval's plan.   

## 2023-01-11 NOTE — Assessment & Plan Note (Signed)
ASCVD risk - primary prevention in patient with diabetes. Last LDL is 36 at goal of <70 mg/dL.  -Continued atorvastatin 80 mg once daily

## 2023-01-11 NOTE — Patient Instructions (Addendum)
It was nice to see you today!  Your goal blood sugar is 80-130 before eating and less than 180 after eating.  Medication Changes: Increase enalapril from 1 tablet once daily to 2 tablets once daily (total dose of 20 mg)  Continue all other medication the same.   Keep up the good work with diet and exercise. Aim for a diet full of vegetables, fruit and lean meats (chicken, [place], fish). Try to limit salt intake by eating fresh or frozen vegetables (instead of canned), rinse canned vegetables prior to cooking and do not add any additional salt to meals.

## 2023-01-11 NOTE — Assessment & Plan Note (Signed)
Diabetes longstanding currently controlled, with worsened control since last pharmacy visit. Patient is able to verbalize appropriate hypoglycemia management plan. Medication adherence appears good. Control is suboptimal due to recent suboptimal diet regimen. -Continued dulaglutide (Trulicity) 0.75 mg once weekly, insulin Lantus (glargine) 5 units once daily -Encouraged dietary changes, d/t upcoming holidays encouraged enjoying the holiday with moderation -Patient educated on purpose, proper use, and potential adverse effects.  -Extensively discussed pathophysiology of diabetes, recommended lifestyle interventions, dietary effects on blood sugar control.

## 2023-01-11 NOTE — Assessment & Plan Note (Signed)
Hypertension longstanding currently uncontrolled. Blood pressure goal of <140 mmHg. Medication adherence good. Blood pressure control is suboptimal due to suboptimal medication regimen. -Increased enalapril from 10 mg once daily to 20 mg once daily (2 tablets) - Patient just received #90 from mail supply pharmacy  -Continued metoprolol succinate 12.5 mg once daily, hydrochlorothiazide 12.5 mg once daily

## 2023-01-11 NOTE — Progress Notes (Signed)
S:     Chief Complaint  Patient presents with   Medication Management    T2DM, HTN   72 y.o. male who presents for diabetes evaluation, education, and management. Patient arrives in good spirits and presents in a wheelchair requiring assistance. Reports skin is better than last appointment, but irritation comes and goes. Increased gabapentin has not improved pain at amputation sites, denying sedation with increased gabapentin dose.   Patient was last seen by Primary Care Provider, Dr. Fatima Blank, on 11/18/2022.   PMH is significant for T2DM, HTN, HLD, Legally Blind, Bilateral Lower Leg Amputee.  At last visit, no changes made to medication regimen.   Current diabetes medications include: dulaglutide (Trulicity) 0.75 mg once weekly, insulin Lantus (glargine) 5 units once daily Current hypertension medications include: metoprolol succinate 12.5 mg once daily, enalapril 10 mg once daily, hydrochlorothiazide 12.5 mg once daily  Current hyperlipidemia medications include: atorvastatin 80 mg once daily  Patient reports adherence to taking all medications as prescribed.   Do you feel that your medications are working for you? Yes  Have you been experiencing any side effects to the medications prescribed? No  Insurance coverage: [employer]  Patient denies hypoglycemic events.  Home reported BP readings: 141/86 this AM -- reports most readings at home in "acceptable range" or "high normal" - 165/103 HR: 69 - in office with home BP cuff  Patient reports nocturia (nighttime urination).  Patient reports neuropathy (nerve pain). - Neuropathy in legs at amputation site - "uncomfortable"  Patient reported dietary habits: Eats 2-3 meals/day, likes to snack throughout the day Dinner: meat, vegetable (cabbage, green beans, pinto beans, spinach, broccoli),  fruit (likes apples, bananas), mashed potatoes, pizza, rice (conscious about portion sizes of rice), Snacks: sweet potato pie,  tasty cakes (eats sweet treats in moderation), ruffles potato chips Drinks: coffee w/ equal packets and creamer,   O:   Review of Systems  Neurological:  Positive for tingling (At amputation sites).  All other systems reviewed and are negative.   Physical Exam Vitals reviewed.  Constitutional:      Appearance: Normal appearance.  Pulmonary:     Effort: Pulmonary effort is normal.  Neurological:     Mental Status: He is alert.  Psychiatric:        Mood and Affect: Mood normal.        Behavior: Behavior normal.        Thought Content: Thought content normal.        Judgment: Judgment normal.    7 day average blood glucose: 155  Libre3 CGM Download today on 01/11/2023 % Time CGM is active: 95% Average Glucose: 155 mg/dL Glucose Management Indicator: 7.0  Glucose Variability: 21.3% (goal <36%) Time in Goal:  - Time in range 70-180: 78% - Time above range: 22% - Time below range: 0% Observed patterns:   Lab Results  Component Value Date   HGBA1C 5.9 10/14/2022   Vitals:   01/11/23 0842  BP: (!) 155/72  Pulse: 62    Lipid Panel     Component Value Date/Time   CHOL 112 07/31/2022 1035   TRIG 100 07/31/2022 1035   HDL 57 07/31/2022 1035   CHOLHDL 2.0 07/31/2022 1035   CHOLHDL 2.4 05/17/2015 1519   VLDL 19 05/17/2015 1519   LDLCALC 36 07/31/2022 1035    Clinical Atherosclerotic Cardiovascular Disease (ASCVD): Yes  The ASCVD Risk score (Arnett DK, et al., 2019) failed to calculate for the following reasons:   The valid  total cholesterol range is 130 to 320 mg/dL   A/P: Diabetes longstanding currently controlled, with worsened control since last pharmacy visit. Patient is able to verbalize appropriate hypoglycemia management plan. Medication adherence appears good. Control is suboptimal due to recent suboptimal diet regimen. -Continued dulaglutide (Trulicity) 0.75 mg once weekly, insulin Lantus (glargine) 5 units once daily -Encouraged dietary changes, d/t  upcoming holidays encouraged enjoying the holiday with moderation -Patient educated on purpose, proper use, and potential adverse effects.  -Extensively discussed pathophysiology of diabetes, recommended lifestyle interventions, dietary effects on blood sugar control.  -Counseled on s/sx of and management of hypoglycemia.  -F/U labs ordered - BMET  ASCVD risk - primary prevention in patient with diabetes. Last LDL is 36 at goal of <70 mg/dL.  -Continued atorvastatin 80 mg once daily  Hypertension longstanding currently uncontrolled. Blood pressure goal of <140 mmHg. Medication adherence good. Blood pressure control is suboptimal due to suboptimal medication regimen. -Increased enalapril from 10 mg once daily to 20 mg once daily (2 tablets) - Patient just received #90 from mail supply pharmacy  -Continued metoprolol succinate 12.5 mg once daily, hydrochlorothiazide 12.5 mg once daily   Written patient instructions provided. Patient verbalized understanding of treatment plan.  Total time in face to face counseling 33 minutes.    Follow-up:  Pharmacist 01/22/2023 PCP clinic visit in [place] Patient seen with Shona Simpson, PharmD Candidate.    BMET - electrolytes normal Renal function stable/improved from previous

## 2023-01-12 ENCOUNTER — Encounter: Payer: Self-pay | Admitting: Pharmacist

## 2023-01-12 LAB — BASIC METABOLIC PANEL
BUN/Creatinine Ratio: 21 (ref 10–24)
BUN: 27 mg/dL (ref 8–27)
CO2: 23 mmol/L (ref 20–29)
Calcium: 9.5 mg/dL (ref 8.6–10.2)
Chloride: 108 mmol/L — ABNORMAL HIGH (ref 96–106)
Creatinine, Ser: 1.26 mg/dL (ref 0.76–1.27)
Glucose: 102 mg/dL — ABNORMAL HIGH (ref 70–99)
Potassium: 4.6 mmol/L (ref 3.5–5.2)
Sodium: 144 mmol/L (ref 134–144)
eGFR: 60 mL/min/{1.73_m2} (ref >59)

## 2023-01-13 ENCOUNTER — Other Ambulatory Visit: Payer: Self-pay

## 2023-01-18 ENCOUNTER — Other Ambulatory Visit: Payer: Self-pay

## 2023-01-21 ENCOUNTER — Other Ambulatory Visit: Payer: Self-pay | Admitting: Family Medicine

## 2023-01-21 MED ORDER — TRIAMCINOLONE ACETONIDE 0.1 % EX CREA: 1.0000 | TOPICAL_CREAM | Freq: Two times a day (BID) | CUTANEOUS | 3 refills | Status: DC

## 2023-02-04 ENCOUNTER — Other Ambulatory Visit: Payer: Self-pay

## 2023-02-04 MED ORDER — METOPROLOL SUCCINATE ER 25 MG PO TB24: 25.0000 mg | ORAL_TABLET | Freq: Every day | ORAL | 3 refills | Status: DC

## 2023-02-18 ENCOUNTER — Other Ambulatory Visit: Payer: Self-pay

## 2023-02-18 MED ORDER — ATORVASTATIN CALCIUM 80 MG PO TABS: 80.0000 mg | ORAL_TABLET | Freq: Every day | ORAL | 3 refills | Status: DC

## 2023-02-22 ENCOUNTER — Ambulatory Visit: Payer: Medicare Other | Admitting: Pharmacist

## 2023-03-22 ENCOUNTER — Ambulatory Visit: Payer: Medicare Other | Admitting: Pharmacist

## 2023-03-22 ENCOUNTER — Encounter: Payer: Self-pay | Admitting: Pharmacist

## 2023-03-22 VITALS — BP 129/62 | HR 69 | Wt 221.0 lb

## 2023-03-22 DIAGNOSIS — I10 Essential (primary) hypertension: Secondary | ICD-10-CM | POA: Diagnosis not present

## 2023-03-22 DIAGNOSIS — E1149 Type 2 diabetes mellitus with other diabetic neurological complication: Secondary | ICD-10-CM | POA: Diagnosis not present

## 2023-03-22 MED ORDER — TRULICITY 1.5 MG/0.5ML ~~LOC~~ SOAJ: 1.5000 mg | SUBCUTANEOUS | 6 refills | Status: DC

## 2023-03-22 MED ORDER — VALSARTAN-HYDROCHLOROTHIAZIDE 160-12.5 MG PO TABS: 1.0000 | ORAL_TABLET | Freq: Every day | ORAL | 3 refills | Status: DC

## 2023-03-22 NOTE — Progress Notes (Signed)
S:     Chief Complaint  Patient presents with   Medication Management    Diabetes f/u   73 y.o. male who presents for diabetes evaluation, education, and management. Patient arrives in good spirits and presents in a wheelchair requiring assistance.   Patient was referred and last seen by Primary Care Provider, Dr. Fatima Blank, on 11/18/2022.   PMH is significant for diabetes and hypertension, legally blind, bilateral lower leg amputee.  At last visit, enalapril was increased from 10 mg daily to 20 mg daily.    Current diabetes medications include: dulaglutide (Trulicity) 0.75 mg once weekly, insulin Lantus (glargine) 5 units once daily  Current hypertension medications include: metoprolol succinate 25 mg once daily, enalapril 2 tablets 10 mg once daily, hydrochlorothiazide 12.5 mg once daily  Current hyperlipidemia medications include: atorvastatin 80 mg once daily   Patient reports adherence to taking all medications as prescribed.   Do you feel that your medications are working for you? yes Have you been experiencing any side effects to the medications prescribed? no Do you have any problems obtaining medications due to transportation or finances? no Insurance coverage: [employer]  Patient denies hypoglycemic events.   O:   Review of Systems  Musculoskeletal:  Positive for joint pain.  All other systems reviewed and are negative.   Physical Exam Vitals reviewed.  Constitutional:      Appearance: Normal appearance.  Pulmonary:     Effort: Pulmonary effort is normal.  Musculoskeletal:     Comments: Bilateral lower extremity amputee  Neurological:     Mental Status: He is alert.  Psychiatric:        Mood and Affect: Mood normal.        Behavior: Behavior normal.     Libre3 CGM Download today 03/22/2023 % Time CGM is active: 95% Average Glucose: 153 mg/dL Glucose Management Indicator: 7.0  Glucose Variability: 25.9% (goal <36%) Time in Goal:  - Time in  range 70-180: 80% - Time above range: 20% - Time below range: 0%   Lab Results  Component Value Date   HGBA1C 5.9 10/14/2022   Vitals:   03/22/23 0907  BP: 129/62  Pulse: 69  SpO2: 100%    Lipid Panel     Component Value Date/Time   CHOL 112 07/31/2022 1035   TRIG 100 07/31/2022 1035   HDL 57 07/31/2022 1035   CHOLHDL 2.0 07/31/2022 1035   CHOLHDL 2.4 05/17/2015 1519   VLDL 19 05/17/2015 1519   LDLCALC 36 07/31/2022 1035    Clinical Atherosclerotic Cardiovascular Disease (ASCVD): Yes  The ASCVD Risk score (Arnett DK, et al., 2019) failed to calculate for the following reasons:   The valid total cholesterol range is 130 to 320 mg/dL   A/P: Diabetes longstanding currently improved. Patient is able to verbalize appropriate hypoglycemia management plan. Medication adherence appears good. Control is suboptimal due to low dose of Trulicity. -Continued basal insulin Lantus (insulin glargine) 5 units until all current 0.75 mg Trulicity doses are used.  -Increased dose of GLP-1 Trulicity (dulaglutide) from 0.75 mg to 1.5 mg .  -Extensively discussed pathophysiology of diabetes, recommended lifestyle interventions, dietary effects on blood sugar control.  -Counseled on s/sx of and management of hypoglycemia.   Hypertension longstanding currently controlled. Blood pressure goal of <130/80 mmHg. Medication adherence good. -Started valsartan-hydrochlorothiazide 160-12.5 mg once daily. -Discontinued enalapril 10 mg 2 tablets daily and hydrochlorothiazide 12.5 mg daily  Written patient instructions provided. Patient verbalized understanding of treatment plan.  Total time in face to face counseling 38 minutes.    Follow-up:  Pharmacist 03/24 with Dr. Raymondo Band PCP clinic visit in [place] Patient seen with Lavona Mound, PharmD Candidate and Pearletha Forge, PharmD Candidate.

## 2023-03-22 NOTE — Progress Notes (Signed)
 Reviewed and agree with Dr Macky Lower plan.

## 2023-03-22 NOTE — Patient Instructions (Addendum)
It was nice to see you today!  Your goal blood sugar is 80-130 before eating and less than 180 after eating.  Medication Changes: START valsartan-hydrochlorothiazide 160-12.5 mg once daily  Continue to use Trulicity 0.75 mg when all doses are used. Then Increase Trulicity (Dulaglutide) 0.75 mg to 1.5 mg. Once started on the new dose, stop taking insulin glargine (Lantus).  Discontinue enalapril 10 mg and hydrochlorothiazide 12.5 mg  Continue all other medication the same.   Keep up the good work with diet and exercise. Aim for a diet full of vegetables, fruit and lean meats (chicken, [place], fish). Try to limit salt intake by eating fresh or frozen vegetables (instead of canned), rinse canned vegetables prior to cooking and do not add any additional salt to meals.

## 2023-03-29 ENCOUNTER — Other Ambulatory Visit: Payer: Self-pay | Admitting: Family Medicine

## 2023-05-10 ENCOUNTER — Encounter: Payer: Self-pay | Admitting: Pharmacist

## 2023-05-10 ENCOUNTER — Ambulatory Visit: Payer: Medicare Other | Admitting: Pharmacist

## 2023-05-10 VITALS — BP 150/68 | Wt 220.0 lb

## 2023-05-10 DIAGNOSIS — E1149 Type 2 diabetes mellitus with other diabetic neurological complication: Secondary | ICD-10-CM

## 2023-05-10 LAB — POCT GLYCOSYLATED HEMOGLOBIN (HGB A1C): HbA1c, POC (controlled diabetic range): 6.4 % (ref 0.0–7.0)

## 2023-05-10 NOTE — Progress Notes (Signed)
 S:     Chief Complaint  Patient presents with   Medication Management    Diabetes Follow Up   73 y.o. male who presents for diabetes evaluation, education, and management. Patient arrives in good spirits and presents with a wheelchair.  Patient was referred and last seen by me on 03/22/2023.  PMH is significant for HTN, HLD, T2DM, amputation (bilateral lower leg amputation).  At last visit, Trulicity was increased to 1.5 mg weekly and combination valsartan-hydrochlorothiazide 160/12.5 mg daily was started.     Current diabetes medications include: dulaglutide (Trulicity) 1.5 mg weekly Current hypertension medications include: valsartan/hydrochlorothiazide 160/12.5 mg daily Current hyperlipidemia medications include: atorvastatin 80 mg daily  Patient reports adherence to taking all medications as prescribed.   Do you feel that your medications are working for you? yes Have you been experiencing any side effects to the medications prescribed? no Insurance coverage: [facility]  Patient denies hypoglycemic events.   O:   Review of Systems  All other systems reviewed and are negative.   Physical Exam Neurological:     Mental Status: He is alert.  Psychiatric:        Mood and Affect: Mood normal.        Thought Content: Thought content normal.        Judgment: Judgment normal.     7 day average blood glucose: 148  Libre3 CGM Download today 04/27/23-05/10/23 % Time CGM is active: 96% Average Glucose: 148 mg/dL Glucose Management Indicator: 6.9%  Glucose Variability: 21.7% (goal <36%) Time in Goal:  - Time in range 70-180: 84% - Time above range: 16% - Time below range: 0% Observed patterns:   Lab Results  Component Value Date   HGBA1C 6.4 05/10/2023   Vitals:   05/10/23 0900  BP: (!) 150/68    Lipid Panel     Component Value Date/Time   CHOL 112 07/31/2022 1035   TRIG 100 07/31/2022 1035   HDL 57 07/31/2022 1035   CHOLHDL 2.0 07/31/2022 1035    CHOLHDL 2.4 05/17/2015 1519   VLDL 19 05/17/2015 1519   LDLCALC 36 07/31/2022 1035    Clinical Atherosclerotic Cardiovascular Disease (ASCVD): Yes  The ASCVD Risk score (Arnett DK, et al., 2019) failed to calculate for the following reasons:   The valid total cholesterol range is 130 to 320 mg/dL    A/P: Diabetes longstanding currently controlled. Patient is able to verbalize appropriate hypoglycemia management plan. Medication adherence-reports adherence.  -Discontinued basal insulin Lantus (insulin glargine). - Attempted to contact both insurance and PBM about coverage if insulin stopped for his CGM.  Unable to determine, after 35 minutes of transferring and holding.  Attempt again, when CGM coverage becomes an issue.  Provide sample to cover potential gaps in future is the plan.  -Continued GLP-1  Trulicity (dulaglutide) 1.5 mg weekly  -Extensively discussed pathophysiology of diabetes, recommended lifestyle interventions, dietary effects on blood sugar control.  -Counseled on s/sx of and management of hypoglycemia.  -Next A1c anticipated today, 6.4%   ASCVD risk - secondary prevention in patient with diabetes. Last LDL is 36 at goal of <55 mg/dL. ASCVD risk factors include HLD, HTN, T2DM. high intensity statin indicated.  -Continued atorvastatin 80 mg daily  Hypertension longstanding currently controlled at home. Blood pressure goal of <140 mmHg. Medication adherence reports adherence. Blood pressure control is suboptimal due to white coat. -Continued valsartan/hydrochlorothiazide 160/12.5 mg daily -Continued metoprolol succinate 25 mg daily     Written patient instructions provided. Patient verbalized  understanding of treatment plan.  Total time in face to face counseling 21 minutes.    Follow-up:  PCP clinic visit in 07/15/2023 Patient seen with Mack Guise, PharmD Candidate.    Phone call to share details of CGM coverage post - insulin d/c Patient asked to call us if  CGM sensors are not covered in future.

## 2023-05-10 NOTE — Assessment & Plan Note (Signed)
 Diabetes longstanding currently controlled. Patient is able to verbalize appropriate hypoglycemia management plan. Medication adherence-reports adherence.  -Discontinued basal insulin Lantus (insulin glargine). - Attempted to contact both insurance and PBM about coverage if insulin stopped for his CGM.  Unable to determine, after 35 minutes of transferring and holding.  Attempt again, when CGM coverage becomes an issue.  Provide sample to cover potential gaps in future is the plan.  -Continued GLP-1  Trulicity (dulaglutide) 1.5 mg weekly  -Extensively discussed pathophysiology of diabetes, recommended lifestyle interventions, dietary effects on blood sugar control.  -Counseled on s/sx of and management of hypoglycemia.  -Next A1c anticipated today, 6.4%

## 2023-05-10 NOTE — Progress Notes (Signed)
 Reviewed and agree with Dr Macky Lower plan.

## 2023-05-10 NOTE — Patient Instructions (Signed)
 It was nice to see you today! Keep up the great work and go [employer]!  Your goal blood sugar is 80-130 before eating and less than 180 after eating.  Medication Changes:  Discontinue Lantus (insulin glargine) 5 units daily  Continue all other medication the same.   Keep up the good work with diet and exercise. Aim for a diet full of vegetables, fruit and lean meats (chicken, [place], fish). Try to limit salt intake by eating fresh or frozen vegetables (instead of canned), rinse canned vegetables prior to cooking and do not add any additional salt to meals.

## 2023-06-16 ENCOUNTER — Other Ambulatory Visit: Payer: Self-pay | Admitting: Family Medicine

## 2023-06-16 DIAGNOSIS — E1149 Type 2 diabetes mellitus with other diabetic neurological complication: Secondary | ICD-10-CM

## 2023-06-25 ENCOUNTER — Other Ambulatory Visit: Payer: Self-pay | Admitting: *Deleted

## 2023-06-25 DIAGNOSIS — E1149 Type 2 diabetes mellitus with other diabetic neurological complication: Secondary | ICD-10-CM

## 2023-06-25 MED ORDER — FREESTYLE LIBRE 3 SENSOR MISC: 1.0000 | 3 refills | Status: DC

## 2023-07-14 NOTE — Progress Notes (Unsigned)
    SUBJECTIVE:   CHIEF COMPLAINT / HPI:   DM - CBGs under control with Trulicity  - No longer taking Lantus  - A1c under control 2 months ago, too early to recheck today  HTN - Patient reports that home BP readings usually in the 120s/70s  PERTINENT  PMH / PSH: T2DM, HTN, HLD, blindness, b/l BKA   OBJECTIVE:   BP 138/72   Pulse 78   Ht 6\' 5"  (1.956 m)   Wt 226 lb (102.5 kg)   SpO2 98%   BMI 26.80 kg/m   General: Awake and Alert in NAD HEENT: Blind. NCAT. Sclera anicteric. No rhinorrhea. Cardiovascular: RRR. No M/R/G Respiratory: CTAB, normal WOB on RA. No wheezing, crackles, rhonchi, or diminished breath sounds. Abdomen: Soft, non-tender, non-distended. Bowel sounds normoactive Extremities: B/l BKA.  Skin: Warm and dry. No abrasions or rashes noted. Neuro: No focal neurological deficits.  ASSESSMENT/PLAN:   Assessment & Plan DM (diabetes mellitus), type 2 with neurological complications (HCC) - Last A1c 6.4 in 03/25 - Home CBGs: 109 about 3 hours after meals this morning - Medications: Trulicity  1.5 mg weekly - Adherence: Compliant - Eye exam: Doesn't see an opthomologist d/t blindness - Foot exam: Has b/l BKA - Microalbumin: Due soon - Statin: Atorvastatin  80 mg - No symptoms of hypoglycemia, polyuria, polydipsia, numbness extremities, foot ulcers/trauma - Follow up in July for A1c and urine ACR, could consider spacing out visits to 6 months  Clyda Dark, DO [facility]

## 2023-07-15 ENCOUNTER — Telehealth: Payer: Self-pay | Admitting: Pharmacist

## 2023-07-15 ENCOUNTER — Ambulatory Visit: Admitting: Family Medicine

## 2023-07-15 VITALS — BP 138/72 | HR 78 | Ht 77.0 in | Wt 226.0 lb

## 2023-07-15 DIAGNOSIS — E1149 Type 2 diabetes mellitus with other diabetic neurological complication: Secondary | ICD-10-CM

## 2023-07-15 MED ORDER — FREESTYLE LIBRE 3 PLUS SENSOR MISC: 3 refills | Status: DC

## 2023-07-15 NOTE — Assessment & Plan Note (Addendum)
-   Last A1c 6.4 in 03/25 - Home CBGs: 109 about 3 hours after meals this morning - Medications: Trulicity  1.5 mg weekly - Adherence: Compliant - Eye exam: Doesn't see an opthomologist d/t blindness - Foot exam: Has b/l BKA - Microalbumin: Due soon - Statin: Atorvastatin  80 mg - No symptoms of hypoglycemia, polyuria, polydipsia, numbness extremities, foot ulcers/trauma - Follow up in July for A1c and urine ACR, could consider spacing out visits to 6 months

## 2023-07-15 NOTE — Patient Instructions (Addendum)
 It was great to see you today! Thank you for choosing [facility] for your primary care. Chad Hall was seen for Diabetes.  Today we addressed:  Diabetes Mellitus Target glucose range: 80 -130 (before meals) and <180 1-2 hours after meals Low blood glucose: < 70  Hyperglycemia symptoms: increased thirst, frequent urination, fatigue, or blurry vision Hypoglycemia symptoms: headache, shaky, sweating, dizziness/lightheadedness, fatigue, difficulty concentration  Dietary Considerations: Low in carbs, high in protein and fiber to help with constipation as well  A1c should be checked about every 3 months until adequate control has been achieved. Goal A1c: < <7%  You should return to our clinic Return in about 1 month (around 08/17/2023) for Diabetes appt. Please arrive 15 minutes before your appointment to ensure smooth check in process.  We appreciate your efforts in making this happen.  Thank you for allowing me to participate in your care, Clyda Dark, DO 07/15/2023, 8:56 AM PGY-1, [facility]

## 2023-07-15 NOTE — Telephone Encounter (Signed)
 Patient contacts office to request assistance with CGM (continuous glucose monitor) supply.  States he needs prescription for Libre 3+sensors with refills  He states [facility] did not get the PLUS sensors but the previous 3 version.  Prescription reviewed.  Appears to be correct device however quantity was incorrect.   Contacted [facility] pharmacy to verify correct device 2 per month and provide appropriate number of sensors per shipment (6 for 3 months supply) as well as refills #3 for the year.   Prescription updated.   Total time with patient call and documentation of interaction: 9 minutes.

## 2023-07-16 NOTE — Telephone Encounter (Signed)
 Reviewed and agree with Dr Macky Lower plan.

## 2023-07-28 ENCOUNTER — Other Ambulatory Visit: Payer: Self-pay | Admitting: Family Medicine

## 2023-07-28 DIAGNOSIS — E1149 Type 2 diabetes mellitus with other diabetic neurological complication: Secondary | ICD-10-CM

## 2023-08-10 ENCOUNTER — Other Ambulatory Visit: Payer: Self-pay | Admitting: Family Medicine

## 2023-08-10 DIAGNOSIS — E1149 Type 2 diabetes mellitus with other diabetic neurological complication: Secondary | ICD-10-CM

## 2023-08-15 ENCOUNTER — Other Ambulatory Visit: Payer: Self-pay | Admitting: Family Medicine

## 2023-08-15 DIAGNOSIS — E1142 Type 2 diabetes mellitus with diabetic polyneuropathy: Secondary | ICD-10-CM

## 2023-08-15 NOTE — Progress Notes (Unsigned)
    SUBJECTIVE:   CHIEF COMPLAINT / HPI:   T2DM ***  PERTINENT  PMH / PSH:  T2DM, HTN, HLD, blindness, b/l BKA   OBJECTIVE:   There were no vitals taken for this visit.  General: Awake and Alert in NAD HEENT: NCAT. Sclera anicteric. No rhinorrhea. Cardiovascular: RRR. No M/R/G Respiratory: CTAB, normal WOB on RA. No wheezing, crackles, rhonchi, or diminished breath sounds. Abdomen: Soft, non-tender, non-distended. Bowel sounds normoactive/hypoactive/hyperactive. *** Extremities: Able to move all extremities. No BLE edema, no deformities or significant joint findings. Skin: Warm and dry. No abrasions or rashes noted. Neuro: A&Ox***. No focal neurological deficits.  ASSESSMENT/PLAN:   Assessment & Plan DM (diabetes mellitus), type 2 with neurological complications (HCC) - Last A1c 6.4 in March 2025 - Home CBGs: *** - Medications: Trulicity  1.5 mg weekly - Adherence: *** - Microalbumin: today - Statin: Lipitor  80 mg daily - No symptoms of hypoglycemia, polyuria, polydipsia, numbness extremities, foot ulcers/trauma     Chad Melena, DO [facility] [facility]

## 2023-08-15 NOTE — Assessment & Plan Note (Signed)
-   Last A1c 6.4 in March 2025, improved today to 6.0 - Medications: Trulicity  1.5 mg weekly - Adherence: Good - Microalbumin: today - Statin: Lipitor  80 mg daily - No symptoms of hypoglycemia, polyuria, polydipsia - Follow up in 6 months

## 2023-08-17 ENCOUNTER — Ambulatory Visit: Payer: Self-pay | Admitting: Family Medicine

## 2023-08-17 ENCOUNTER — Encounter: Payer: Self-pay | Admitting: Family Medicine

## 2023-08-17 VITALS — BP 125/76 | HR 79 | Ht 77.0 in | Wt 212.0 lb

## 2023-08-17 DIAGNOSIS — E1149 Type 2 diabetes mellitus with other diabetic neurological complication: Secondary | ICD-10-CM | POA: Diagnosis not present

## 2023-08-17 LAB — POCT GLYCOSYLATED HEMOGLOBIN (HGB A1C): HbA1c, POC (controlled diabetic range): 6 % (ref 0.0–7.0)

## 2023-08-17 NOTE — Patient Instructions (Addendum)
 It was great to see you today! Thank you for choosing [facility] for your primary care. Chad Hall was seen for diabetes.  Today we addressed: Diabetes - keep up the good work! Continue to take your medications as prescribed. Your A1c is 6.0!  Target glucose range: 80 -130 (before meals) and <180 1-2 hours after meals Low blood glucose: < 70  Hyperglycemia symptoms: increased thirst, frequent urination, fatigue, or blurry vision Hypoglycemia symptoms: headache, shaky, sweating, dizziness/lightheadedness, fatigue, difficulty concentration  Dietary Considerations: Low in carbs, high in protein and fiber Exercise: Regular physical activity can help control your sugars. Walking, running, and lifting weights are all forms of activity to consider. Smoking: Increases the risks of diabetic-related complications. Quitting is the best for your health.  A1c should be checked about every 3 months until adequate control has been achieved. Goal A1c: < <7%  We are checking some labs today. I will send you a MyChart message with your results, per your preference. If you do not hear about your labs in the next 2 weeks, please call the office.  If you haven't already, sign up for My Chart to have easy access to your labs results, and communication with your primary care physician.  You should return to our clinic No follow-ups on file. Please arrive 15 minutes before your appointment to ensure smooth check in process.  We appreciate your efforts in making this happen.  Thank you for allowing me to participate in your care, Kathrine Melena, DO 08/17/2023, 8:46 AM PGY-2, [facility]

## 2023-08-18 LAB — LIPID PANEL
Chol/HDL Ratio: 2.1 ratio (ref 0.0–5.0)
Cholesterol, Total: 103 mg/dL (ref 100–199)
HDL: 49 mg/dL (ref >39)
LDL Chol Calc (NIH): 39 mg/dL (ref 0–99)
Triglycerides: 70 mg/dL (ref 0–149)
VLDL Cholesterol Cal: 15 mg/dL (ref 5–40)

## 2023-08-18 LAB — MICROALBUMIN / CREATININE URINE RATIO
Creatinine, Urine: 340.3 mg/dL
Microalb/Creat Ratio: 104 mg/g{creat} — ABNORMAL HIGH (ref 0–29)
Microalbumin, Urine: 353.8 ug/mL

## 2023-08-18 LAB — BASIC METABOLIC PANEL WITH GFR
BUN/Creatinine Ratio: 15 (ref 10–24)
BUN: 33 mg/dL — ABNORMAL HIGH (ref 8–27)
CO2: 19 mmol/L — ABNORMAL LOW (ref 20–29)
Calcium: 9.7 mg/dL (ref 8.6–10.2)
Chloride: 107 mmol/L — ABNORMAL HIGH (ref 96–106)
Creatinine, Ser: 2.25 mg/dL — ABNORMAL HIGH (ref 0.76–1.27)
Glucose: 148 mg/dL — ABNORMAL HIGH (ref 70–99)
Potassium: 4.7 mmol/L (ref 3.5–5.2)
Sodium: 143 mmol/L (ref 134–144)
eGFR: 30 mL/min/{1.73_m2} — ABNORMAL LOW (ref >59)

## 2023-08-19 ENCOUNTER — Telehealth: Payer: Self-pay | Admitting: Family Medicine

## 2023-08-19 ENCOUNTER — Ambulatory Visit: Payer: Self-pay | Admitting: Family Medicine

## 2023-08-19 NOTE — Telephone Encounter (Signed)
 Called patient to discuss lab results due to concern of his creatinine and GFR being abnormal from baseline.  He reports that he has not been taking any anti-inflammatories at this time, he does report some concern for dehydration.  Advised drinking at least 8 cups of water a day and avoiding anti-inflammatories until follow-up to recheck BMP.  Future BMP lab order placed.  Lab appointment scheduled July 14th at 8:30 AM.  Kathrine Melena, DO 08/19/23 3:50 PM

## 2023-08-19 NOTE — Addendum Note (Signed)
 Addended by: Tammy Ericsson on: 08/19/2023 03:51 PM   Modules accepted: Orders

## 2023-08-30 ENCOUNTER — Other Ambulatory Visit: Payer: Self-pay

## 2023-08-30 DIAGNOSIS — E1149 Type 2 diabetes mellitus with other diabetic neurological complication: Secondary | ICD-10-CM

## 2023-08-31 LAB — BASIC METABOLIC PANEL WITH GFR
BUN/Creatinine Ratio: 20 (ref 10–24)
BUN: 34 mg/dL — ABNORMAL HIGH (ref 8–27)
CO2: 20 mmol/L (ref 20–29)
Calcium: 9.4 mg/dL (ref 8.6–10.2)
Chloride: 108 mmol/L — ABNORMAL HIGH (ref 96–106)
Creatinine, Ser: 1.66 mg/dL — ABNORMAL HIGH (ref 0.76–1.27)
Glucose: 97 mg/dL (ref 70–99)
Potassium: 5.8 mmol/L — ABNORMAL HIGH (ref 3.5–5.2)
Sodium: 145 mmol/L — ABNORMAL HIGH (ref 134–144)
eGFR: 43 mL/min/1.73 — ABNORMAL LOW (ref >59)

## 2023-10-10 ENCOUNTER — Other Ambulatory Visit: Payer: Self-pay | Admitting: Family Medicine

## 2023-11-21 ENCOUNTER — Other Ambulatory Visit: Payer: Self-pay | Admitting: Family Medicine

## 2023-11-29 ENCOUNTER — Ambulatory Visit

## 2023-11-29 VITALS — Ht 77.0 in | Wt 223.0 lb

## 2023-11-29 DIAGNOSIS — Z0000 Encounter for general adult medical examination without abnormal findings: Secondary | ICD-10-CM | POA: Diagnosis not present

## 2023-11-29 NOTE — Patient Instructions (Signed)
 Chad Hall,  Thank you for taking the time for your Medicare Wellness Visit. I appreciate your continued commitment to your health goals. Please review the care plan we discussed, and feel free to reach out if I can assist you further.  Medicare recommends these wellness visits once per year to help you and your care team stay ahead of potential health issues. These visits are designed to focus on prevention, allowing your provider to concentrate on managing your acute and chronic conditions during your regular appointments.  Please note that Annual Wellness Visits do not include a physical exam. Some assessments may be limited, especially if the visit was conducted virtually. If needed, we may recommend a separate in-person follow-up with your provider.  Ongoing Care Seeing your primary care provider every 3 to 6 months helps us  monitor your health and provide consistent, personalized care.   Referrals If a referral was made during today's visit and you haven't received any updates within two weeks, please contact the referred provider directly to check on the status.  Recommended Screenings:  Health Maintenance  Topic Date Due   Zoster (Shingles) Vaccine (1 of 2) Never done   DTaP/Tdap/Td vaccine (2 - Td or Tdap) 08/09/2022   Flu Shot  09/17/2023   COVID-19 Vaccine (7 - 2025-26 season) 10/18/2023   Hemoglobin A1C  02/17/2024   Yearly kidney health urinalysis for diabetes  08/16/2024   Lipid (cholesterol) test  08/16/2024   Yearly kidney function blood test for diabetes  08/29/2024   Medicare Annual Wellness Visit  11/28/2024   Colon Cancer Screening  09/10/2026   Pneumococcal Vaccine for age over 50  Completed   Hepatitis C Screening  Completed   Meningitis B Vaccine  Aged Out   Complete foot exam   Discontinued       11/29/2023    8:36 AM  Advanced Directives  Does Patient Have a Medical Advance Directive? Yes  Type of Estate agent of [place];Living  will  Copy of Healthcare Power of Attorney in Chart? No - copy requested   Advance Care Planning is important because it: Ensures you receive medical care that aligns with your values, goals, and preferences. Provides guidance to your family and loved ones, reducing the emotional burden of decision-making during critical moments.  Vision: Annual vision screenings are recommended for early detection of glaucoma, cataracts, and diabetic retinopathy. These exams can also reveal signs of chronic conditions such as diabetes and high blood pressure.  Dental: Annual dental screenings help detect early signs of oral cancer, gum disease, and other conditions linked to overall health, including heart disease and diabetes.  Please see the attached documents for additional preventive care recommendations.

## 2023-11-29 NOTE — [place] Notes (Signed)
 Because this visit was a virtual/telehealth visit,  certain criteria was not obtained, such a blood pressure, CBG if applicable, and timed get up and go. Any medications not marked as taking were not mentioned during the medication reconciliation part of the visit. Any vitals not documented were not able to be obtained due to this being a telehealth visit or patient was unable to self-report a recent blood pressure reading due to a lack of equipment at home via telehealth. Vitals that have been documented are verbally provided by the patient.   Subjective:   Chad Hall is a 73 y.o. who presents for a Medicare Wellness preventive visit.  As a reminder, Annual Wellness Visits don't include a physical exam, and some assessments may be limited, especially if this visit is performed virtually. We may recommend an in-person follow-up visit with your provider if needed.  Visit Complete: Virtual I connected with  Chad Hall on 11/29/23 by a audio enabled telemedicine application and verified that I am speaking with the correct person using two identifiers.  Patient Location: Home  Provider Location: Home Office  I discussed the limitations of evaluation and management by telemedicine. The patient expressed understanding and agreed to proceed.  Vital Signs: Because this visit was a virtual/telehealth visit, some criteria may be missing or patient reported. Any vitals not documented were not able to be obtained and vitals that have been documented are patient reported.  VideoDeclined- This patient declined Librarian, academic. Therefore the visit was completed with audio only.  Persons Participating in Visit: Patient.  AWV Questionnaire: No: Patient Medicare AWV questionnaire was not completed prior to this visit.  Cardiac Risk Factors include: advanced age (>62men, >19 women);diabetes mellitus;dyslipidemia;family history of premature cardiovascular  disease;hypertension;male gender;sedentary lifestyle     Objective:    Today's Vitals   11/29/23 0834 11/29/23 0835  Weight: 223 lb (101.2 kg)   Height: 6' 5 (1.956 m)   PainSc: 0-No pain 0-No pain   Body mass index is 26.44 kg/m.     11/29/2023    8:36 AM 11/18/2022    9:30 AM 07/31/2022    9:24 AM 07/28/2022    8:44 AM 06/19/2022   10:56 AM 04/03/2022    2:25 PM 03/05/2022    3:00 PM  Advanced Directives  Does Patient Have a Medical Advance Directive? Yes No No No No No No  Type of Estate agent of [place];Living will        Does patient want to make changes to medical advance directive?       No - Patient declined  Copy of Healthcare Power of Attorney in Chart? No - copy requested        Would patient like information on creating a medical advance directive?   No - Patient declined No - Patient declined No - Patient declined No - Patient declined No - Patient declined    Current Medications (verified) Outpatient Encounter Medications as of 11/29/2023  Medication Sig   acetaminophen  (TYLENOL ) 325 MG tablet Take 2 tablets (650 mg total) by mouth every 6 (six) hours as needed for mild pain (or Fever >/= 101).   atorvastatin  (LIPITOR ) 80 MG tablet Take 1 tablet (80 mg total) by mouth daily.   augmented betamethasone  dipropionate (DIPROLENE -AF) 0.05 % cream APPLY TOPICALLY TO AFFECTED  AREA(S) TWICE DAILY   cetirizine  (ZYRTEC ) 10 MG tablet Take 10 mg by mouth daily.   cholecalciferol  (VITAMIN D3) 25 MCG (1000 UNIT) tablet  Take 1 tablet (1,000 Units total) by mouth at bedtime.   Continuous Glucose Sensor (FREESTYLE LIBRE 3 PLUS SENSOR) MISC Change sensor every 15 days.   diphenhydrAMINE (BENADRYL) 25 mg capsule Take 25 mg by mouth every 6 (six) hours as needed.   gabapentin  (NEURONTIN ) 300 MG capsule TAKE 1 CAPSULE BY MOUTH 3 TIMES  DAILY   Insulin  Pen Needle (BD PEN NEEDLE NANO U/F) 32G X 4 MM MISC USE 1 NEEDLE TO INJECT  SUBCUTANEOUSLY DAILY   metoprolol   succinate (TOPROL -XL) 25 MG 24 hr tablet Take 1 tablet (25 mg total) by mouth daily.   Prodigy Twist Top Lancets 28G MISC CHECK BLOOD SUGAR 3 TIMES DAILY   triamcinolone  0.1% oint-Eucerin equivalent cream 1:1 mixture Apply topically 2 (two) times daily as needed. 1 jar please   TRULICITY  1.5 MG/0.5ML SOAJ INJECT THE CONTENTS OF ONE PEN  SUBCUTANEOUSLY WEEKLY AS  DIRECTED   valsartan -hydrochlorothiazide  (DIOVAN -HCT) 160-12.5 MG tablet Take 1 tablet by mouth daily.   No facility-administered encounter medications on file as of 11/29/2023.    Allergies (verified) Patient has no known allergies.   History: Past Medical History:  Diagnosis Date   Allergy    Bacteremia due to Staphylococcus aureus without sepsis 11/17/2020   Blindness - both eyes 1967   Basketball injury   Diabetes mellitus    Gangrene of right foot (HCC)    Hernia    Hyperlipidemia    Hypertension    Left hip pain 04/10/2013   Maggot infestation 08/23/2019   Marfan syndrome    Osteomyelitis of foot, left, acute (HCC) 12/26/2018   Polymicrobial bacterial infection 11/12/2020   Right knee pain 10/04/2017   Past Surgical History:  Procedure Laterality Date   AMPUTATION Left 12/28/2018   Procedure: LEFT FOOT 3RD RAY AMPUTATION;  Surgeon: Harden Jerona GAILS, MD;  Location: [facility] OR;  Service: Orthopedics;  Laterality: Left;   AMPUTATION Left 01/03/2020   Procedure: LEFT BELOW KNEE AMPUTATION;  Surgeon: Harden Jerona GAILS, MD;  Location: [facility] OR;  Service: Orthopedics;  Laterality: Left;   AMPUTATION Right 11/13/2020   Procedure: RIGHT BELOW KNEE AMPUTATION;  Surgeon: Harden Jerona GAILS, MD;  Location: [facility] OR;  Service: Orthopedics;  Laterality: Right;   APPENDECTOMY     INGUINAL HERNIA REPAIR     Right 1970s,    Left 2011 by Dr Tanda FONTANA HERNIA REPAIR  08/25/10   recurrent left   PENILE PROSTHESIS IMPLANT  2000s   [facility], Dr Aleene   RETINAL DETACHMENT SURGERY  1970   Rotator Cuff Repair  2009   Left side, Dr Lesleigh     ROTATOR CUFF REPAIR  2010-2011?   right side   TEE WITHOUT CARDIOVERSION N/A 11/18/2020   Procedure: TRANSESOPHAGEAL ECHOCARDIOGRAM (TEE);  Surgeon: Santo Stanly LABOR, MD;  Location: [facility];  Service: Cardiovascular;  Laterality: N/A;   Family History  Problem Relation Age of Onset   Heart disease Mother    Breast cancer Sister    Social History   Socioeconomic History   Marital status: Married    Spouse name: CHANCELOR HARDRICK   Number of children: 0   Years of education: 12th    Highest education level: Not on file  Occupational History   Occupation: industrial sewing    Employer: [employer]  Tobacco Use   Smoking status: Former    Current packs/day: 0.00    Types: Cigarettes    Quit date: 02/14/1984    Years since quitting: 39.8  Smokeless tobacco: Never  Substance and Sexual Activity   Alcohol use: No    Comment: Quit in 10/1983   Drug use: No   Sexual activity: Not Currently  Other Topics Concern   Not on file  Social History Narrative   Lives in [place] with wife, AIME MELOCHE. Patient is blind in both eyes due to injury.  Has service dog-[employer] Employee: [employer], [employer] for [employer] for food-drop containers Health Care POA: Emergency Contact: wife, Shreyansh Tiffany [phone] End of Life Plan: Who lives with you: wife Any pets: guide dog, [employer] Diet: Pt has a varied diet of protein, starch, and vegetables. Exercise: Pt has no regular exercise routine. Seatbelts: Pt reports wearing seatbelt when in vehicles. [place] Exposure/Protection: None Hobbies: [employer], walking, sports   Social Drivers of Corporate investment banker Strain: Low Risk  (11/29/2023)   Overall Financial Resource Strain (CARDIA)    Difficulty of Paying Living Expenses: Not hard at all  Food Insecurity: No Food Insecurity (11/29/2023)   Hunger Vital Sign    Worried About Running Out of Food in the Last Year: Never true    Ran Out of Food in the Last  Year: Never true  Transportation Needs: No Transportation Needs (11/29/2023)   PRAPARE - Administrator, Civil Service (Medical): No    Lack of Transportation (Non-Medical): No  Physical Activity: Insufficiently Active (11/29/2023)   Exercise Vital Sign    Days of Exercise per Week: 3 days    Minutes of Exercise per Session: 20 min  Stress: No Stress Concern Present (11/29/2023)   [employer] of Occupational Health - Occupational Stress Questionnaire    Feeling of Stress: Not at all  Social Connections: Moderately Integrated (11/29/2023)   Social Connection and Isolation Panel    Frequency of Communication with Friends and Family: More than three times a week    Frequency of Social Gatherings with Friends and Family: Never    Attends Religious Services: Never    Diplomatic Services operational officer: No    Attends Engineer, structural: More than 4 times per year    Marital Status: Married    Tobacco Counseling Counseling given: Not Answered    Clinical Intake:  Pre-visit preparation completed: Yes  Pain : No/denies pain Pain Score: 0-No pain     BMI - recorded: 26.44 Nutritional Status: BMI 25 -29 Overweight Nutritional Risks: None Diabetes: Yes CBG done?: No Did pt. bring in CBG monitor from home?: No  Lab Results  Component Value Date   HGBA1C 6.0 08/17/2023   HGBA1C 6.4 05/10/2023   HGBA1C 5.9 10/14/2022     How often do you need to have someone help you when you read instructions, pamphlets, or other written materials from your doctor or pharmacy?: 1 - Never What is the last grade level you completed in school?: HSG  Interpreter Needed?: No  Information entered by :: Tylerjames Hoglund N. Rainah Kirshner, LPN.   Activities of Daily Living     11/29/2023    8:38 AM  In your present state of health, do you have any difficulty performing the following activities:  Hearing? 0  Vision? 0  Difficulty concentrating or making decisions? 0   Walking or climbing stairs? 0  Dressing or bathing? 0  Doing errands, shopping? 0  Preparing Food and eating ? N  Using the Toilet? N  In the past six months, have you accidently leaked urine? N  Do you  have problems with loss of bowel control? N  Managing your Medications? N  Managing your Finances? N  Housekeeping or managing your Housekeeping? N    Patient Care Team: Janna Ferrier, DO as PCP - General Aleene Kitchens, MD (Inactive) (Urology) Celestia Agent, MD (Inactive) (Gastroenterology)  I have updated your Care Teams any recent Medical Services you may have received from other providers in the past year.     Assessment:   This is a routine wellness examination for [facility].  Hearing/Vision screen Hearing Screening - Comments:: Denies hearing difficulties.  Vision Screening - Comments:: No rx glasses - not up to date with routine eye exams. Patient is totally blind.    Goals Addressed             This Visit's Progress    Patient Stated       To stay healthy.       Depression Screen     11/29/2023    8:37 AM 07/15/2023    8:40 AM 11/18/2022    9:30 AM 07/31/2022    9:23 AM 07/28/2022    8:43 AM 06/19/2022   10:55 AM 04/03/2022    2:28 PM  PHQ 2/9 Scores  PHQ - 2 Score 0 0   0  0  PHQ- 9 Score 0 0   0  1  Exception Documentation   Patient refusal Patient refusal  Patient refusal     Fall Risk     11/29/2023    8:37 AM 08/17/2023    8:36 AM 11/18/2022    9:30 AM 07/31/2022    9:23 AM 07/28/2022    8:44 AM  Fall Risk   Falls in the past year? 0 0 0 0 1  Number falls in past yr: 0  0 0 1  Injury with Fall? 0  0 0 0  Risk for fall due to : No Fall Risks    Impaired balance/gait;Impaired mobility;History of fall(s)  Follow up Falls evaluation completed    Falls evaluation completed;Education provided;Falls prevention discussed    MEDICARE RISK AT HOME:  Medicare Risk at Home Any stairs in or around the home?: Yes (RAMP) If so, are there any without  handrails?: No Home free of loose throw rugs in walkways, pet beds, electrical cords, etc?: Yes Adequate lighting in your home to reduce risk of falls?: Yes Life alert?: Yes (CELL PHONE, PANIC BUTTON W/ALARM SYSTEM) Use of a cane, walker or w/c?: Yes (WHEELCHAIR) Grab bars in the bathroom?: No Shower chair or bench in shower?: Yes (BATH CHAIR) Elevated toilet seat or a handicapped toilet?: Yes  TIMED UP AND GO:  Was the test performed?  No  Cognitive Function: Declined/Normal: No cognitive concerns noted by patient or family. Patient alert, oriented, able to answer questions appropriately and recall recent events. No signs of memory loss or confusion.    11/29/2023    8:39 AM 09/18/2011    9:00 AM  MMSE - Mini Mental State Exam  Not completed: Unable to complete   Orientation to time  5   Orientation to Place  5   Registration  3   Attention/ Calculation  4   Recall  3   Language- name 2 objects  2   Language- repeat  1  Language- follow 3 step command  3   Language- read & follow direction  1   Write a sentence  1   Copy design  0   Total score  28  Data saved with a previous flowsheet row definition        11/29/2023    8:39 AM 07/28/2022    8:41 AM  6CIT Screen  What Year? 0 points 0 points  What month? 0 points 0 points  What time? 0 points 0 points  Count back from 20 0 points 0 points  Months in reverse 0 points 0 points  Repeat phrase 0 points 0 points  Total Score 0 points 0 points    Immunizations Immunization History  Administered Date(s) Administered   Fluad Quad(high Dose 65+) 11/16/2021   Fluad Trivalent(High Dose 65+) 11/18/2022   Influenza Split 01/13/2012   Influenza,inj,Quad PF,6+ Mos 11/09/2012, 10/20/2013, 10/18/2015   Influenza-Unspecified 11/17/2014, 11/03/2016, 11/04/2019   PFIZER(Purple Top)SARS-COV-2 Vaccination 05/05/2019, 05/31/2019, 01/08/2020   Pfizer Covid-19 Vaccine Bivalent Booster 31yrs & up 04/17/2021    Pfizer(Comirnaty)Fall Seasonal Vaccine 12 years and older 03/05/2022, 11/18/2022   Pneumococcal Conjugate-13 01/13/2016   Pneumococcal Polysaccharide-23 06/18/2017   Tdap 08/08/2012    Screening Tests Health Maintenance  Topic Date Due   Zoster Vaccines- Shingrix (1 of 2) Never done   DTaP/Tdap/Td (2 - Td or Tdap) 08/09/2022   Influenza Vaccine  09/17/2023   COVID-19 Vaccine (7 - 2025-26 season) 10/18/2023   HEMOGLOBIN A1C  02/17/2024   Diabetic kidney evaluation - Urine ACR  08/16/2024   LIPID PANEL  08/16/2024   Diabetic kidney evaluation - eGFR measurement  08/29/2024   Medicare Annual Wellness (AWV)  11/28/2024   Colonoscopy  09/10/2026   Pneumococcal Vaccine: 50+ Years  Completed   Hepatitis C Screening  Completed   Meningococcal B Vaccine  Aged Out   FOOT EXAM  Discontinued    Health Maintenance Items Addressed: Yes Vaccines Due: Dtap, Flu, Covid and Shingrix.  NCIR was verified; no new vaccines listed.  Additional Screening:  Vision Screening: Recommended annual ophthalmology exams for early detection of glaucoma and other disorders of the eye. Is the patient up to date with their annual eye exam?  No  Who is the provider or what is the name of the office in which the patient attends annual eye exams? [facility]  Dental Screening: Recommended annual dental exams for proper oral hygiene  Community Resource Referral / Chronic Care Management: CRR required this visit?  No   CCM required this visit?  No   Plan:    I have personally reviewed and noted the following in the patient's chart:   Medical and social history Use of alcohol, tobacco or illicit drugs  Current medications and supplements including opioid prescriptions. Patient is not currently taking opioid prescriptions. Functional ability and status Nutritional status Physical activity Advanced directives List of other physicians Hospitalizations, surgeries, and ER visits in previous 12  months Vitals Screenings to include cognitive, depression, and falls Referrals and appointments  In addition, I have reviewed and discussed with patient certain preventive protocols, quality metrics, and best practice recommendations. A written personalized care plan for preventive services as well as general preventive health recommendations were provided to patient.   Roz LOISE Fuller, LPN   [id number]   After Visit Summary: (MyChart) Due to this being a telephonic visit, the after visit summary with patients personalized plan was offered to patient via MyChart   Notes: Nothing significant to report at this time.

## 2023-12-12 ENCOUNTER — Other Ambulatory Visit: Payer: Self-pay | Admitting: Family Medicine

## 2023-12-13 NOTE — Assessment & Plan Note (Deleted)
-   Last A1c 6.0 in 08/2023 - Home CBGs: *** - Medications: Trulicity  1.5 mg weekly - Adherence: *** - Eye exam: Deferred - Foot exam: Deferred - Microalbumin: Performed 08/2023 - Statin: Lipitor  80 mg daily - No symptoms of hypoglycemia, polyuria, polydipsia, numbness extremities, foot ulcers/trauma

## 2023-12-13 NOTE — [place] Notes (Deleted)
    SUBJECTIVE:   CHIEF COMPLAINT / HPI:   DM - A1c 6.0, 3 months ago  PERTINENT  PMH / PSH:  T2DM, HTN, HLD, blindness, b/l BKA   OBJECTIVE:   There were no vitals taken for this visit.  General: Awake and Alert in NAD HEENT: NCAT. Sclera anicteric. No rhinorrhea. Cardiovascular: RRR. No M/R/G Respiratory: CTAB, normal WOB on RA. No wheezing, crackles, rhonchi, or diminished breath sounds. Abdomen: Soft, non-tender, non-distended. Bowel sounds normoactive/hypoactive/hyperactive. *** Extremities: Able to move all extremities. No BLE edema, no deformities or significant joint findings. Skin: Warm and dry. No abrasions or rashes noted. Neuro: A&Ox***. No focal neurological deficits.  ASSESSMENT/PLAN:   Assessment & Plan DM (diabetes mellitus), type 2 with neurological complications (HCC) - Last A1c 6.0 in 08/2023 - Home CBGs: *** - Medications: Trulicity  1.5 mg weekly - Adherence: *** - Eye exam: Deferred - Foot exam: Deferred - Microalbumin: Performed 08/2023 - Statin: Lipitor  80 mg daily - No symptoms of hypoglycemia, polyuria, polydipsia, numbness extremities, foot ulcers/trauma     Kathrine Melena, DO [place] [facility]

## 2023-12-14 ENCOUNTER — Ambulatory Visit: Admitting: Family Medicine

## 2023-12-14 DIAGNOSIS — E1149 Type 2 diabetes mellitus with other diabetic neurological complication: Secondary | ICD-10-CM

## 2023-12-17 NOTE — Progress Notes (Signed)
 Chad Hall                                          MRN: [id number]   12/17/2023   The [facility]BCI Quality Team Specialist reviewed this patient medical record for the purposes of chart review for care gap closure. The following were reviewed: chart review for care gap closure-diabetic eye exam.    VBCI Quality Team

## 2023-12-17 NOTE — [place] Notes (Signed)
 JEREMAINE MARAJ                                          MRN: [id number]   12/17/2023   The VBCI Quality Team Specialist reviewed this patient medical record for the purposes of chart review for care gap closure. The following were reviewed: abstraction for care gap closure-glycemic status assessment.    VBCI Quality Team

## 2023-12-19 NOTE — Assessment & Plan Note (Signed)
-   Last A1c 6.0 in 08/2023, 6.2% today - Medications: Trulicity  1.5 mg weekly, continue  - Adherence: Good - Eye exam: Visually impaired - Foot exam: B/l BKA - Microalbumin: Performed 08/2023 - Statin: Lipitor  80 mg daily - No symptoms of hypoglycemia, polyuria, polydipsia, numbness extremities, foot ulcers/trauma - Follow up in 6 months

## 2023-12-19 NOTE — Progress Notes (Unsigned)
    SUBJECTIVE:   CHIEF COMPLAINT / HPI:   DM - A1c 6.0, 3 months ago - Tolerating medications well  Wanted a refill on his eczema creams  PERTINENT  PMH / PSH:  T2DM, HTN, HLD, blindness, b/l BKA   OBJECTIVE:   BP (!) 146/78   Pulse 68   Ht 6' 5 (1.956 m)   Wt 217 lb 3.2 oz (98.5 kg)   SpO2 99%   BMI 25.76 kg/m   General: Awake and Alert in NAD HEENT: NCAT. Sclera anicteric. No rhinorrhea. Visually impaired bilaterally. Cardiovascular: RRR. No M/R/G Respiratory: CTAB, normal WOB on RA. No wheezing, crackles, rhonchi, or diminished breath sounds. Extremities: B/l BKA with prostheses.  Skin: Warm and dry. No abrasions or rashes noted. Neuro: A&Ox3. No focal neurological deficits.  ASSESSMENT/PLAN:   Assessment & Plan DM (diabetes mellitus), type 2 with neurological complications (HCC) - Last A1c 6.0 in 08/2023, 6.2% today - Medications: Trulicity  1.5 mg weekly, continue  - Adherence: Good - Eye exam: Visually impaired - Foot exam: B/l BKA - Microalbumin: Performed 08/2023 - Statin: Lipitor  80 mg daily - No symptoms of hypoglycemia, polyuria, polydipsia, numbness extremities, foot ulcers/trauma - Follow up in 6 months   Refilled Eczema creams.   Kathrine Melena, DO [place] [facility]

## 2023-12-22 ENCOUNTER — Encounter: Payer: Self-pay | Admitting: Family Medicine

## 2023-12-22 ENCOUNTER — Ambulatory Visit: Admitting: Family Medicine

## 2023-12-22 ENCOUNTER — Telehealth: Payer: Self-pay | Admitting: Family Medicine

## 2023-12-22 VITALS — BP 146/78 | HR 68 | Ht 77.0 in | Wt 217.2 lb

## 2023-12-22 DIAGNOSIS — Z23 Encounter for immunization: Secondary | ICD-10-CM

## 2023-12-22 DIAGNOSIS — E1149 Type 2 diabetes mellitus with other diabetic neurological complication: Secondary | ICD-10-CM | POA: Diagnosis not present

## 2023-12-22 DIAGNOSIS — E875 Hyperkalemia: Secondary | ICD-10-CM

## 2023-12-22 LAB — POCT GLYCOSYLATED HEMOGLOBIN (HGB A1C): HbA1c, POC (controlled diabetic range): 6.2 % (ref 0.0–7.0)

## 2023-12-22 MED ORDER — BETAMETHASONE DIPROPIONATE AUG 0.05 % EX CREA
TOPICAL_CREAM | Freq: Two times a day (BID) | CUTANEOUS | 2 refills | Status: AC
Start: 2023-12-22 — End: ?

## 2023-12-22 MED ORDER — TRIAMCINOLONE 0.1 % CREAM:EUCERIN CREAM 1:1: 1.0000 | TOPICAL_CREAM | Freq: Two times a day (BID) | CUTANEOUS | 3 refills | Status: DC | PRN

## 2023-12-22 MED ORDER — TRIAMCINOLONE ACETONIDE 0.5 % EX CREA: TOPICAL_CREAM | Freq: Two times a day (BID) | CUTANEOUS | 3 refills | Status: AC | PRN

## 2023-12-22 NOTE — Telephone Encounter (Addendum)
 Called patient to discuss returning for lab appointment to get a repeat a BMP to recheck his sporadically elevated potassium. Patient agreed, appointment set up for 11/7 at 8:30 AM.  Future order for BMP placed.  Kathrine Melena, DO 12/22/23 12:00 PM

## 2023-12-22 NOTE — Patient Instructions (Addendum)
 It was great to see you today! Thank you for choosing [facility] for your primary care. Chad Hall was seen for diabetes.  Today we addressed: Diabetes - continue medications as prescribed, we will follow-up in 6 months. Your A1c is 6.2 today. Refilled your eczema creams  You should return to our clinic No follow-ups on file. Please arrive 15 minutes before your appointment to ensure smooth check in process.  We appreciate your efforts in making this happen.  Thank you for allowing me to participate in your care, Kathrine Melena, DO 12/22/2023, 9:08 AM PGY-2, [facility]

## 2023-12-24 ENCOUNTER — Other Ambulatory Visit

## 2023-12-24 DIAGNOSIS — E875 Hyperkalemia: Secondary | ICD-10-CM

## 2023-12-25 ENCOUNTER — Ambulatory Visit: Payer: Self-pay | Admitting: Family Medicine

## 2023-12-25 LAB — BASIC METABOLIC PANEL WITH GFR
BUN/Creatinine Ratio: 18 (ref 10–24)
BUN: 30 mg/dL — ABNORMAL HIGH (ref 8–27)
CO2: 23 mmol/L (ref 20–29)
Calcium: 9.1 mg/dL (ref 8.6–10.2)
Chloride: 106 mmol/L (ref 96–106)
Creatinine, Ser: 1.71 mg/dL — ABNORMAL HIGH (ref 0.76–1.27)
Glucose: 133 mg/dL — ABNORMAL HIGH (ref 70–99)
Potassium: 4.4 mmol/L (ref 3.5–5.2)
Sodium: 143 mmol/L (ref 134–144)
eGFR: 41 mL/min/1.73 — ABNORMAL LOW (ref >59)

## 2024-01-02 ENCOUNTER — Other Ambulatory Visit: Payer: Self-pay | Admitting: Family Medicine

## 2024-01-02 DIAGNOSIS — I10 Essential (primary) hypertension: Secondary | ICD-10-CM

## 2024-01-26 ENCOUNTER — Telehealth: Payer: Self-pay

## 2024-01-26 NOTE — Telephone Encounter (Addendum)
 Rec'd PA for patients Freestyle Libre 3 Plus Sensor. Key [id number]  CGM will most likely require patient be taking insulin , otherwise patient will need to have one of the following:   - history of problematic hypoglycemia with documentation of recurrent level 2 hypoglycemic events (glucose less than 54 mg/dl [6.9 mmol/L]) that persist despite multiple attempts to adjust medication(s) and/or modify the diabetes treatment plan   Or   - history of problematic hypoglycemia with documentation of a history of a level 3 hypoglycemic event (glucose less than 54mg /dL [6.9ffno/O]) characterized by altered mental and/or physical state requiring third-party assistance for treatment of hypoglycemia   Does either apply to this patient?

## 2024-02-07 ENCOUNTER — Telehealth: Payer: Self-pay | Admitting: Pharmacist

## 2024-02-07 NOTE — Telephone Encounter (Signed)
 Pharmacy Patient Advocate Encounter   Received notification from CoverMyMeds that prior authorization for FreeStyle Libre 3 Plus Sensor  is required/requested.   Insurance verification completed.   The patient is insured through [facility].   PA required; PA submitted to above mentioned insurance via Latent Key/confirmation #/EOC [id number]. Status is pending

## 2024-02-07 NOTE — Telephone Encounter (Signed)
 Ok warehouse manager, sorry for the confusion!   Please see Dr. Rennis note from earlier regarding the [employer] advocate for provision of CGM. I'm not familiar with this.... or if this is something that can be used with the appeal?  _______  Pharmacy Patient Advocate Encounter  Received notification from [facility] that Prior Authorization for FreeStyle Libre 3 Plus Sensor  has been DENIED.  Full denial letter will be uploaded to the media tab. See denial reason below.  Continuous glucose monitor system (receiver, transmitter, and sensor) is denied for not meeting the prior authorization requirement(s).  Product authorization requires the following:  (1) Submission of medical records (for example: chart notes, laboratory values) or claims history documenting one of the following:  (A) You are being treated with insulin .  (B) You have a history of problematic hypoglycemia with documentation of recurrent level 2 hypoglycemic events [glucose less than 54mg /dl (3.0 mmol/L)] that persist despite multiple attempts to adjust medication(s) and/or modify the diabetes treatment plan.  (C) You have a history of problematic hypoglycemia with documentation of a history of a level 3 hypoglycemic event [glucose less than 54mg /dl (3.0 mmol/L)] characterized by altered mental and/or physical state requiring third-party assistance for treatment of hypoglycemia.   PA #/Case ID/Reference #: [id number]

## 2024-02-07 NOTE — Telephone Encounter (Signed)
 Patient contacts office,  leaving voice mail requesting call back related to CGM - denial through insurance.   Patient contacted for follow-up.  Review of CGM shows current GMI of 6.6 , avg glucose of  138, and TIR of 92%  Since last contact patient reports he has received notification of denial for CGM sensors.  I explained that we resubmitted the PA form for this today and this likely resulted in the denial communication.  I shared the update from ADA related to CGM utility being covered.  I assured him that I would be following-up on the denial of CGM sensors on his behalf.   He current has 3 sensors - available for his use.    Total time with patient call and documentation of interaction: 17 minutes.  F/U Phone call planned: January 16th at the latest.  Patient does NOT have a planned scheduled future in-person visit at this time.

## 2024-02-07 NOTE — Telephone Encounter (Signed)
 Apologies, the PA was never submitted due to the questions most likely resulting in a denial. Let me attempt the PA and see what we have from there.

## 2024-02-08 ENCOUNTER — Telehealth: Payer: Self-pay | Admitting: Pharmacist

## 2024-02-08 ENCOUNTER — Encounter: Payer: Self-pay | Admitting: Pharmacist

## 2024-02-08 DIAGNOSIS — E1149 Type 2 diabetes mellitus with other diabetic neurological complication: Secondary | ICD-10-CM

## 2024-02-08 NOTE — Telephone Encounter (Signed)
 Appeal has been submitted. Will advise when response is received, please be advised that most companies may take 30 days to make a decision. Appeal letter and supporting documentation have been faxed to [phone] on 02/08/2024 @1 :07 pm.  Thank you, Devere Pandy, PharmD Clinical Pharmacist  [place]  Direct Dial: [phone]

## 2024-02-08 NOTE — Telephone Encounter (Signed)
 error

## 2024-02-14 NOTE — Telephone Encounter (Signed)
 Received message regarding appeal. They are needing additional information.   Provided with case number: [id number]  Called back to number provided to provide additional information.   No answer. Will forward to pharmacy team as well.   Chiquita [name] English, RN

## 2024-02-15 NOTE — Telephone Encounter (Signed)
 Patient's med list and medical history/clinic notes have been faxed again to [phone].

## 2024-02-15 NOTE — Telephone Encounter (Signed)
 Rec'd fax from appeal dept requesting complete diagnosis of diabetes and medication history.   Documentation needed by 02/15/24.   Devere currently on PAL.   Faxed necessary docs to [phone]

## 2024-02-28 ENCOUNTER — Other Ambulatory Visit (HOSPITAL_COMMUNITY): Payer: Self-pay

## 2024-02-28 MED ORDER — FREESTYLE LIBRE 3 PLUS SENSOR MISC: 11 refills | Status: AC

## 2024-02-28 NOTE — Addendum Note (Signed)
 Addended by: Alanmichael Barmore G on: 02/28/2024 12:10 PM   Modules accepted: Orders

## 2024-02-28 NOTE — Telephone Encounter (Signed)
 Insurance has approved the appeal for [employer] 3 Plu through 02/11/2025.    Thank you, Devere Pandy, PharmD Clinical Pharmacist  [place]  Direct Dial: [phone]

## 2024-02-28 NOTE — Telephone Encounter (Signed)
 Attempted to contact patient for follow-up of CGM Approval after APPEAL approval.    Communicated approval for Libre 3+ for the upcoming calendar year.  He has ~ 12 days on current sensor.   He understood I would send new order for remainder of 2026  Total time with patient call and documentation of interaction:  .

## 2024-02-29 ENCOUNTER — Other Ambulatory Visit: Payer: Self-pay | Admitting: Family Medicine

## 2024-11-30 ENCOUNTER — Encounter
